# Patient Record
Sex: Male | Born: 1950 | Race: White | Hispanic: No | Marital: Single | Smoking: Former smoker
Health system: Southern US, Community
[De-identification: ages and names within clinical notes are randomized; demographics above are authoritative.]

## PROBLEM LIST (undated history)

## (undated) DIAGNOSIS — Z9981 Dependence on supplemental oxygen: Secondary | ICD-10-CM

## (undated) DIAGNOSIS — K279 Peptic ulcer, site unspecified, unspecified as acute or chronic, without hemorrhage or perforation: Secondary | ICD-10-CM

## (undated) DIAGNOSIS — J1282 Pneumonia due to coronavirus disease 2019: Secondary | ICD-10-CM

## (undated) DIAGNOSIS — E785 Hyperlipidemia, unspecified: Secondary | ICD-10-CM

## (undated) DIAGNOSIS — I700 Atherosclerosis of aorta: Secondary | ICD-10-CM

## (undated) DIAGNOSIS — I639 Cerebral infarction, unspecified: Secondary | ICD-10-CM

## (undated) DIAGNOSIS — U071 COVID-19: Secondary | ICD-10-CM

## (undated) DIAGNOSIS — K219 Gastro-esophageal reflux disease without esophagitis: Secondary | ICD-10-CM

## (undated) DIAGNOSIS — J449 Chronic obstructive pulmonary disease, unspecified: Secondary | ICD-10-CM

## (undated) DIAGNOSIS — J9610 Chronic respiratory failure, unspecified whether with hypoxia or hypercapnia: Secondary | ICD-10-CM

## (undated) DIAGNOSIS — I480 Paroxysmal atrial fibrillation: Secondary | ICD-10-CM

## (undated) DIAGNOSIS — I219 Acute myocardial infarction, unspecified: Secondary | ICD-10-CM

## (undated) DIAGNOSIS — F1010 Alcohol abuse, uncomplicated: Secondary | ICD-10-CM

## (undated) DIAGNOSIS — I3139 Other pericardial effusion (noninflammatory): Secondary | ICD-10-CM

## (undated) DIAGNOSIS — M67919 Unspecified disorder of synovium and tendon, unspecified shoulder: Secondary | ICD-10-CM

## (undated) DIAGNOSIS — M1990 Unspecified osteoarthritis, unspecified site: Secondary | ICD-10-CM

## (undated) DIAGNOSIS — I2510 Atherosclerotic heart disease of native coronary artery without angina pectoris: Secondary | ICD-10-CM

## (undated) DIAGNOSIS — I313 Pericardial effusion (noninflammatory): Secondary | ICD-10-CM

## (undated) HISTORY — PX: COLONOSCOPY: SHX174

## (undated) HISTORY — DX: Unspecified osteoarthritis, unspecified site: M19.90

## (undated) HISTORY — PX: EYE SURGERY: SHX253

## (undated) HISTORY — DX: Peptic ulcer, site unspecified, unspecified as acute or chronic, without hemorrhage or perforation: K27.9

## (undated) HISTORY — DX: Unspecified disorder of synovium and tendon, unspecified shoulder: M67.919

## (undated) HISTORY — DX: Hyperlipidemia, unspecified: E78.5

## (undated) HISTORY — DX: Paroxysmal atrial fibrillation: I48.0

## (undated) HISTORY — DX: Chronic obstructive pulmonary disease, unspecified: J44.9

## (undated) HISTORY — DX: Acute myocardial infarction, unspecified: I21.9

## (undated) HISTORY — PX: OTHER SURGICAL HISTORY: SHX169

## (undated) HISTORY — DX: Gastro-esophageal reflux disease without esophagitis: K21.9

---

## 2001-07-11 ENCOUNTER — Inpatient Hospital Stay (HOSPITAL_COMMUNITY): Admission: EM | Admit: 2001-07-11 | Discharge: 2001-07-13 | Payer: Self-pay | Admitting: *Deleted

## 2001-07-11 ENCOUNTER — Encounter: Payer: Self-pay | Admitting: Cardiology

## 2001-07-12 ENCOUNTER — Encounter: Payer: Self-pay | Admitting: Cardiology

## 2001-09-07 ENCOUNTER — Ambulatory Visit (HOSPITAL_COMMUNITY): Admission: RE | Admit: 2001-09-07 | Discharge: 2001-09-08 | Payer: Self-pay | Admitting: Cardiology

## 2004-08-17 DIAGNOSIS — I219 Acute myocardial infarction, unspecified: Secondary | ICD-10-CM

## 2004-08-17 HISTORY — DX: Acute myocardial infarction, unspecified: I21.9

## 2005-10-30 ENCOUNTER — Ambulatory Visit (HOSPITAL_COMMUNITY): Admission: RE | Admit: 2005-10-30 | Discharge: 2005-10-30 | Payer: Self-pay | Admitting: Orthopedic Surgery

## 2006-09-17 ENCOUNTER — Ambulatory Visit (HOSPITAL_BASED_OUTPATIENT_CLINIC_OR_DEPARTMENT_OTHER): Admission: RE | Admit: 2006-09-17 | Discharge: 2006-09-17 | Payer: Self-pay | Admitting: Urology

## 2007-11-09 ENCOUNTER — Emergency Department (HOSPITAL_COMMUNITY): Admission: EM | Admit: 2007-11-09 | Discharge: 2007-11-09 | Payer: Self-pay | Admitting: Emergency Medicine

## 2008-03-08 ENCOUNTER — Ambulatory Visit: Payer: Self-pay | Admitting: Internal Medicine

## 2008-03-08 LAB — CONVERTED CEMR LAB
BUN: 10 mg/dL (ref 6–23)
CO2: 22 meq/L (ref 19–32)
Calcium: 9.4 mg/dL (ref 8.4–10.5)
Chloride: 104 meq/L (ref 96–112)
Cholesterol: 197 mg/dL (ref 0–200)
Creatinine, Ser: 0.9 mg/dL (ref 0.40–1.50)
Glucose, Bld: 93 mg/dL (ref 70–99)
HDL: 87 mg/dL (ref >39)
LDL Cholesterol: 99 mg/dL (ref 0–99)
Potassium: 4.4 meq/L (ref 3.5–5.3)
Sodium: 140 meq/L (ref 135–145)
Total CHOL/HDL Ratio: 2.3
Triglycerides: 56 mg/dL (ref <150)
VLDL: 11 mg/dL (ref 0–40)

## 2008-03-12 ENCOUNTER — Ambulatory Visit: Payer: Self-pay | Admitting: *Deleted

## 2008-10-02 ENCOUNTER — Encounter (INDEPENDENT_AMBULATORY_CARE_PROVIDER_SITE_OTHER): Payer: Self-pay | Admitting: *Deleted

## 2009-07-25 ENCOUNTER — Ambulatory Visit: Payer: Self-pay | Admitting: Family Medicine

## 2009-07-25 DIAGNOSIS — M67919 Unspecified disorder of synovium and tendon, unspecified shoulder: Secondary | ICD-10-CM | POA: Insufficient documentation

## 2009-07-25 DIAGNOSIS — M1990 Unspecified osteoarthritis, unspecified site: Secondary | ICD-10-CM | POA: Insufficient documentation

## 2009-07-25 DIAGNOSIS — M719 Bursopathy, unspecified: Secondary | ICD-10-CM

## 2009-07-25 DIAGNOSIS — F17200 Nicotine dependence, unspecified, uncomplicated: Secondary | ICD-10-CM | POA: Insufficient documentation

## 2010-01-08 ENCOUNTER — Telehealth: Payer: Self-pay | Admitting: Internal Medicine

## 2010-04-23 ENCOUNTER — Emergency Department (HOSPITAL_COMMUNITY): Admission: EM | Admit: 2010-04-23 | Discharge: 2010-04-23 | Payer: Self-pay | Admitting: Emergency Medicine

## 2010-05-13 ENCOUNTER — Ambulatory Visit: Payer: Self-pay | Admitting: Vascular Surgery

## 2010-05-13 ENCOUNTER — Ambulatory Visit: Admission: RE | Admit: 2010-05-13 | Discharge: 2010-05-13 | Payer: Self-pay | Admitting: Cardiology

## 2010-05-13 ENCOUNTER — Encounter: Payer: Self-pay | Admitting: Cardiology

## 2010-09-16 NOTE — Progress Notes (Signed)
Summary: Schedule Colonoscopy  Phone Note Outgoing Call Call back at Home Phone [phone]   Call placed by: Harlow Mares CMA Duncan Dull),  Jan 08, 2010 11:09 AM Call placed to: Patient Summary of Call: spoke to patient and he is out of work and does nto have any insurance i gave him the number to patient assistance and advised him of the program he will contact them and if he is able to get assistance he will call back to schedule his colonoscopy.  Initial call taken by: Harlow Mares CMA (AAMA),  Jan 08, 2010 11:10 AM

## 2010-09-16 NOTE — Miscellaneous (Signed)
Summary: ROI  ROI   Imported By: De Nurse 08/02/2009 14:07:48  _____________________________________________________________________  External Attachment:    Type:   Image     Comment:   External Document

## 2010-09-16 NOTE — Letter (Signed)
Summary: Recall Colonoscopy Letter  [facility]  [address] [place], [place] [address]   Phone: [phone]  Fax: [phone]      October 02, 2008 Zachary Chambers   MELCHOR KIRCHGESSNER [address] [place], [place]  [address]   Dear Mr. Oneil,   According to your medical record, it is time for you to schedule a Colonoscopy. The American Cancer Society recommends this procedure as a method to detect early colon cancer. Patients with a family history of colon cancer, or a personal history of colon polyps or inflammatory bowel disease are at increased risk.  This letter has beeen generated based on the recommendations made at the time of your procedure. If you feel that in your particular situation this may no longer apply, please contact our office.  Please call our office at [phone] to schedule this appointment or to update your records at your earliest convenience.  Thank you for cooperating with [place] to provide you with the very best care possible.   Sincerely,  Wilhemina Bonito. Marina Goodell M.D.  [facility] [phone]

## 2010-09-18 ENCOUNTER — Encounter: Payer: Self-pay | Admitting: *Deleted

## 2010-10-30 LAB — DIFFERENTIAL
Basophils Absolute: 0 10*3/uL (ref 0.0–0.1)
Basophils Relative: 1 % (ref 0–1)
Eosinophils Absolute: 0 10*3/uL (ref 0.0–0.7)
Eosinophils Relative: 1 % (ref 0–5)
Lymphocytes Relative: 17 % (ref 12–46)
Lymphs Abs: 1 10*3/uL (ref 0.7–4.0)
Monocytes Absolute: 1.1 10*3/uL — ABNORMAL HIGH (ref 0.1–1.0)
Monocytes Relative: 19 % — ABNORMAL HIGH (ref 3–12)
Neutro Abs: 3.9 10*3/uL (ref 1.7–7.7)
Neutrophils Relative %: 64 % (ref 43–77)

## 2010-10-30 LAB — CBC
HCT: 29.8 % — ABNORMAL LOW (ref 39.0–52.0)
Hemoglobin: 10.6 g/dL — ABNORMAL LOW (ref 13.0–17.0)
MCH: 35.1 pg — ABNORMAL HIGH (ref 26.0–34.0)
MCHC: 35.4 g/dL (ref 30.0–36.0)
MCV: 99.1 fL (ref 78.0–100.0)
Platelets: 710 10*3/uL — ABNORMAL HIGH (ref 150–400)
RBC: 3.01 MIL/uL — ABNORMAL LOW (ref 4.22–5.81)
RDW: 12.9 % (ref 11.5–15.5)
WBC: 6.2 10*3/uL (ref 4.0–10.5)

## 2010-10-30 LAB — COMPREHENSIVE METABOLIC PANEL
ALT: 10 U/L (ref 0–53)
AST: 17 U/L (ref 0–37)
Albumin: 2.8 g/dL — ABNORMAL LOW (ref 3.5–5.2)
Alkaline Phosphatase: 54 U/L (ref 39–117)
BUN: 6 mg/dL (ref 6–23)
CO2: 24 mEq/L (ref 19–32)
Calcium: 8.8 mg/dL (ref 8.4–10.5)
Chloride: 99 mEq/L (ref 96–112)
Creatinine, Ser: 0.79 mg/dL (ref 0.4–1.5)
GFR calc Af Amer: >60 mL/min (ref >60)
GFR calc non Af Amer: >60 mL/min (ref >60)
Glucose, Bld: 121 mg/dL — ABNORMAL HIGH (ref 70–99)
Potassium: 3.7 mEq/L (ref 3.5–5.1)
Sodium: 130 mEq/L — ABNORMAL LOW (ref 135–145)
Total Bilirubin: 0.4 mg/dL (ref 0.3–1.2)
Total Protein: 6.4 g/dL (ref 6.0–8.3)

## 2010-10-30 LAB — POCT CARDIAC MARKERS
CKMB, poc: <1 ng/mL — ABNORMAL LOW (ref 1.0–8.0)
Myoglobin, poc: 70.3 ng/mL (ref 12–200)
Troponin i, poc: <0.05 ng/mL (ref 0.00–0.09)

## 2010-12-30 ENCOUNTER — Encounter: Payer: Self-pay | Admitting: Cardiovascular Disease

## 2011-01-02 NOTE — Op Note (Signed)
NAMEJOHNN, KRASOWSKI                   ACCOUNT NO.:  [id number]   MEDICAL RECORD NO.:  [id number]          PATIENT TYPE:  AMB   LOCATION:  [facility]                         FACILITY:  [facility]   PHYSICIAN:  Bertram Millard. Dahlstedt, M.D.DATE OF BIRTH:  October 09, 1950   DATE OF PROCEDURE:  09/17/2006  DATE OF DISCHARGE:                               OPERATIVE REPORT   PREOPERATIVE DIAGNOSIS:  Large symptomatic right hydrocele.   POSTOPERATIVE DIAGNOSIS:  Large symptomatic right hydrocele.   PROCEDURE:  Right hydrocelectomy.   SURGEON:  Bertram Millard. Dahlstedt, M.D.   ANESTHESIA:  General with LMA.   COMPLICATIONS:  None.   BRIEF HISTORY:  This 60 year old male presented to my office over 6  months ago with a symptomatic right hydrocele.  Evaluation included just  a scrotal ultrasound as this testicle was not palpable on the right.  This revealed a large hydrocele with normal appearing testicles.   I recommended, then, that the patient be treated conservatively--no  surgery unless it bothered him significantly.  He recently called, and  said that he would like to proceed with surgery as it is quite  uncomfortable for him at the present time.  Risks and complications have  been discussed with the patient.  He understands these and desires to  proceed.   DESCRIPTION OF PROCEDURE:  The patient was administered preoperative IV  antibiotics.  The surgical side was marked; and he was taken to the  operating room where general anesthetic was administered using the LMA.  Genitalia and perineum were prepped and draped.  A 6-cm incision was  made on his anterior-inferior scrotum along the median raphe.  This was  carried down to tunica vaginalis with electrocautery.  Once we were  right on the tunica, circumferential dissection was achieved using blunt  dissection, freeing up the whole tunica vaginalis up to the cord.  The  hydrocele was then popped through the skin..  The hydrocele sac was  opened  anteriorly along its length.  Care was taken to cauterize the  edges quite well.  The fluid was drained.  The hydrocele was everted,  and imbricated posteriorly with a running 3-0 chromic.   The cord was then infiltrated with 7 mL of 1/4% plain Marcaine.  The  scrotal skin was everted, thus looking for small bleeders; none could be  seen.  A small drain hole was made in the inferior scrotum; and a 1/4-  inch Penrose drain placed through this and sutured to skin with 3-0  chromic.  The inner scrotum and the cord as well as the testicle were  inspected; and no bleeding was seen.  The scrotum was closed in two  layers.  The dartos muscle reapproximated with a running 3-0 chromic;  and the skin reapproximated with a running 4-0 chromic.  Dry sterile  dressings were placed.  Fluffs and then a jock strap was placed.   The patient tolerated procedure well.  He was awakened and taken to PACU  in stable condition.  He was told to discontinue his drain in 2 days.  Instructions were given  for that.  He was discharged on hydrocodone/apap  5/325 one p.o. q.4 h. p.r.n. pain #25 with one refill.  He will follow-  up on the 15th of this month.  Activity restrictions were administered  as well.      Bertram Millard. Dahlstedt, M.D.  Electronically Signed     SMD/MEDQ  D:  09/17/2006  T:  09/17/2006  Job:  [id number]   cc:   Gwen Pounds, MD  Fax: [phone]   Colleen Can. Deborah Chalk, M.D.  Fax: [phone]

## 2011-01-02 NOTE — Cardiovascular Report (Signed)
[place]. [facility]  Patient:    Zachary Chambers, Zachary Chambers Visit Number: [id number] MRN: [id number]          Service Type: MED Location: [facility] 2931 01 Attending Physician:  Eleanora Neighbor Dictated by:   Colleen Can. Deborah Chalk, M.D. Proc. Date: 07/11/01 Admit Date:  07/11/2001                          Cardiac Catheterization  HISTORY:  Mr. Swigart is a 60 year old gentleman who presented with acute chest pain that was off and on but associated with dramatic ST segment elevation in the inferolateral leads with chest pain and resolved when pain improved in the emergency room.  PROCEDURE:  Left heart catheterization with selective coronary angiography, left ventricular angiography, and stent placement in the left circumflex.  CARDIOLOGIST:  Colleen Can. Deborah Chalk, M.D.  TYPE AND SITE OF ENTRY:  Percutaneous right femoral artery.  CATHETERS:  6-French 4 curved Judkins right and left coronary catheters, 6-French pigtail ventriculographic catheter, 7-French FL4 guide catheter, high-torque floppy guidewire, and two Bx Velocity stents, more distally a 3.0 x 18 mm stent, and more proximally a 3.0 x 13 mm stent.  MEDICATIONS GIVEN PRIOR TO PROCEDURE:  Heparin and IV nitroglycerin .  MEDICATIONS GIVEN DURING PROCEDURE:  Integrilin and Versed 2 mg IV.  COMMENTS:  The patient tolerated the procedure well.  HEMODYNAMIC DATA:  The aortic pressure was 106/61.  LV was 110/7-14.  There was no aortic valve gradient noted on pullback.  ANGIOGRAPHIC DATA:  CORONARY ARTERIES: 1. Left main coronary artery is normal. 2. Left circumflex:  The left circumflex has sequential 95 and 80 to 90%    stenoses.  It is a large posterolateral branch and almost certainly the    culprit vessel.  The second lesion is a long segmental lesion. 3. Left anterior descending: The left anterior descending has calcification    proximally.  There is an 80 to 90% stenosis in the mid portion of the    left  anterior descending just beyond the large diagonal vessels. 4. Right coronary artery: The right coronary artery is a moderately large    dominant vessel.  There is 20 to 30% narrowing proximally, and as    procedure pictures progressed, there was catheter damping felt to be    related to spasm.  There was a 40 to 50% narrowing before the crux.  The    distal vessel is relatively small but satisfactory.  LEFT VENTRICULAR ANGIOGRAM:  Left ventricular angiogram was performed in the RAO projection.  Overall cardiac size and silhouette are normal.  There is very minimal inferior hypokinesis but normal global ejection fraction of 60%.  ANGIOPLASTY PROCEDURE:  We elected to perform angioplasty on the left circumflex.  Using a JL4, the high-torque floppy guidewire was positioned distally across both lesions.  We dilated the proximal lesion with a 3.0 x 20 mm CrossSail balloon and subsequently dilated the second sequential lesion with a 3.0 x 20 CrossSail.  We then returned with a 3.0 x 18 mm Bx Velocity distally and a 3.0 x 13 mm Bx Velocity proximally.  Both were inflated to 14 atmospheres.  The final angiographic result was felt to be quite satisfactory, and we were able to preserve the branch in the A-V groove, although there did appear to be at least 50 to 60% narrowing that improved after nitroglycerin.  OVERALL IMPRESSION: 1. Successful angioplasty of sequential lesions in the left  circumflex    coronary artery in the setting of acute evolving inferolateral myocardial    infarction. 2. Well preserved left ventricular function. 3. Residual 80% stenosis in the left anterior descending artery with    moderate coronary atherosclerosis in the right coronary artery. Dictated by:   Colleen Can Deborah Chalk, M.D. Attending Physician:  Eleanora Neighbor DD:  07/11/01 TD:  07/11/01 Job: [id number]

## 2011-01-02 NOTE — H&P (Signed)
[place]. [facility]  Patient:    Zachary Chambers, [place] T. Visit Number: [id number] MRN: [id number]          Service Type: Attending:  Colleen Can. Deborah Chalk, M.D. Dictated by:   Colleen Can Deborah Chalk, M.D. Adm. Date:  07/11/01                           History and Physical  CHIEF COMPLAINT: Acute inferolateral myocardial infarction.  HISTORY OF PRESENT ILLNESS: Mr. Wymer had onset of severe substernal chest pain with associated nausea and diaphoresis at approximately 5 a.m.  It would tend to go, lasting no more than ten minutes at a time.  He presented to the emergency room and had acute inferolateral ST segment elevation that resolved after nitroglycerin.  He was referred for hospitalization and catheterization with possible angioplasty.  SOCIAL HISTORY: He is a two pack a day cigarette smoker and drinks six to 12 beers a day.  He is divorced.  He has a son, who is with him today.  He is a Games developer.  PAST MEDICAL HISTORY: He has no other significant cardiovascular risk factors. He has had a history of gastroesophageal reflux disease.  He has no other significant past medical history.  MEDICATIONS:  1. Potassium.  2. On medicine for gastroesophageal reflux disease.  FAMILY HISTORY: Noncontributory.  Father is 81.  Mother is 39.  There is no heart disease in the family.  REVIEW OF SYSTEMS: Otherwise negative.  PHYSICAL EXAMINATION:  VITAL SIGNS: Blood pressure 129/73, heart rate 54, respiratory rate 22.  HEENT: Negative.  He has a full beard.  NECK: No jugular venous distention.  CHEST: Lungs are reasonably clear.  HEART: Regular rate and rhythm.  ABDOMEN: Soft, nontender.  EXTREMITIES: Peripheral pulses excellent.  No peripheral edema.  LABORATORY DATA: EKG shows dramatic ST elevation inferolaterally but follow-up EKG is essentially normal.  Chest x-ray is normal.  OVERALL IMPRESSION:  1. Acute inferolateral myocardial infarction  with resolution of ST segment     elevation.  2. Cigarette abuse.  3. Alcohol abuse.  4. Gastroesophageal reflux disease.  PLAN: The patient will be admitted and will go to the catheterization laboratory for urgent catheterization.  Procedure risks and benefits have been explained.  Risks including heart attack, stroke, heart stoppage, and death have all been explained and the patient understands and is willing to proceed. Dictated by:   Colleen Can Deborah Chalk, M.D. Attending:  Colleen Can. Deborah Chalk, M.D. DD:  07/11/01 TD:  07/11/01 Job: [id number]

## 2011-01-02 NOTE — Discharge Summary (Signed)
[place]. [facility]  Patient:    Zachary Chambers, Zachary Chambers Visit Number: [id number] MRN: [id number]          Service Type: CAT Location: 6500 6524 02 Attending Physician:  Eleanora Neighbor Dictated by:   Jennet Maduro Earl Gala, R.N., A.N.P. Admit Date:  09/07/2001 Discharge Date: 09/08/2001                             Discharge Summary  DISCHARGE DIAGNOSES: 1. Previous inferolateral myocardial infarction with stent placement to the    left circumflex July 11, 2001, with residual left anterior descending    disease with subsequent repeat coronary angiography and stent placement fo    the left anterior descending. 2. Ongoing tobacco abuse. 3. Ongoing alcohol use. 4. Gastroesophageal reflux disease.  HISTORY OF PRESENT ILLNESS:  Mr. Chevalier is a 60 year old white male who has known coronary artery disease.  He had a previous inferolateral MI in November with stent placement to the left circumflex.  At that time he demonstrated residual LAD disease and was now brought back in for further revascularization. From a cardiac standpoint he has done well and has remained asymptomatic.  Please see the dictated history and physical for further patient presentation and profile.  LABORATORY DATA:  Admission CBC shows hemoglobin 17, hematocrit 48, white count 6.7, platelets 263.  PT and PTT were unremarkable.  Cholesterol panel 221, HDL 59, LDL 147 with triglycerides 74.  Chemistries were all within normal limits.  HOSPITAL COURSE:  The patient was admitted from shortstay to undergo elective coronary angiography.  The overall procedure was tolerated well without any known complications.  LV function is normal.  The LAD had a 75% narrowing after the first diagonal branch and a subsequent 3.0 x 18 mm Cordis BX Velocity expandable stent was placed and inflated to a maximum of 14 atmospheres with an excellent result obtained.  The stents in the left circumflex have mild  irregularities, but are overall satisfactory.  There was ostial spasm of the right coronary that was resolved with intracoronary nitroglycerin.  There is a distal 50% narrowing of the right coronary.  IV Integrilin, heparin, and nitroglycerin were given during the procedure and he was subsequently transferred to 6500 for further monitoring.  Plans will be now made for him to be discharged in the morning if deemed stable on a.m. rounds.  CONDITION ON DISCHARGE:  Improved.  DISCHARGE MEDICATIONS:  He will resume all of his previous home medicines.  He will adding Plavix 75 mg a day for the next 21 days.  Will also add Lipitor 10 mg a day for cholesterol management.  ACTIVITY:  Light over the next two to three days.  WOUND CARE:  He is to place an ice pack as needed to the groin.  He is to call our office for any problems.  Otherwise will plan on seeing him in the office in approximately 10 days and he is asked to call to schedule that appointment. ictated by:   Jennet Maduro Earl Gala, R.N., A.N.P. Attending Physician:  Eleanora Neighbor DD:  09/07/01 TD:  09/08/01 Job: [id number]

## 2011-01-02 NOTE — Discharge Summary (Signed)
[place]. [facility]  Patient:    Zachary Chambers, Zachary Chambers Visit Number: [id number] MRN: [id number]          Service Type: MED Location: 2000 2024 01 Attending Physician:  Eleanora Neighbor Dictated by:   Jennet Maduro Earl Gala, R.N., A.N.P. Admit Date:  07/11/2001 Discharge Date: 07/13/2001                             Discharge Summary  DISCHARGE DIAGNOSES: 1. Acute inferolateral myocardial infarction with emergent coronary    angiography and subsequent stent placement to the left circumflex with    residual disease in the left anterior descending. 2. Ongoing tobacco abuse. 3. Ongoing alcohol abuse. 4. Hypokalemia. 5. Gastroesophageal reflux disease.  HISTORY OF PRESENT ILLNESS:  Zachary Chambers had the onset of severe substernal chest pain that was associated with nausea and diaphoresis at approximately 5 a.m. on the day of admission.  It tended to come and go.  He presented to the emergency room because he had never had symptoms like this before.  There he was noted to have ST segment elevation in the inferolateral leads.  He was referred for admission and subsequent emergent coronary angiography.  Please see the dictated History and Physical per Dr. Roger Chambers for further patient presentation and profile.  LABORATORY DATA AND X-RAY FINDINGS:  Hematocrit stable at 45, white count 11,000.  Potassium low at 2.9.  His troponin peaked at 0.92.  Cardiac enzymes peaked at 12.4.  HOSPITAL COURSE:  THe patient was admitted and he underwent acute coronary angiography per Dr. Roger Chambers.  Those results are as noted above.  He had successful angioplasty of sequential lesions in the left circumflex with stent placement x 2.  The final angiographic result was felt to be quite satisfactory and the branch in the AV groove was able to be preserved.  There is a residual 80% lesion in the left anterior descending with moderate disease in the right coronary.  It is felt  that he will need further revascularization in the future.  Postprocedure, he was transferred to the coronary care unit.  He progressed quite well.  He was given low-dose Valium around the clock due to his extensive alcohol history.  He had no evidence of DTs.  ACE inhibitor was able to be initiated.  His potassium was replaced accordingly and today on July 13, 2001, he is doing well.  He has had a followup PA and lateral chest x-ray which is unremarkable.  Chemistries at discharge showed a sodium of 138, potassium 3.9, chloride 104, CO2 29, BUN 4, creatinine 0.9 and glucose of 112. His overall physical exam is unremarkable and he is felt to be a stable candidate for discharge today.  CONDITION ON DISCHARGE:  Improved.  DISCHARGE MEDICATIONS: 1. Resume Nexium and potassium that he was taking at home. 2. Plavix 75 mg a day for the next 21 days to be taken with aspirin. 3. Aspirin daily. 4. Imdur 30 mg a day. 5. Altace 2.5 mg daily. 6. Nitroglycerin p.r.n.  ACTIVITY:  Light activity with no driving, no sexual intercourse.  He may walk 5-10 minutes two times a day.  DIET:  Low fat.  WOUND CARE:  He is to place an ice pack to the groin as needed.  SPECIAL INSTRUCTIONS:  He is strongly encouraged not to smoke.  FOLLOWUP:  Follow up in the office with a nurse practitioner in approximately 10 days and  he is asked to call the office to schedule that appointment time. Dictated by:   Jennet Maduro Earl Gala, R.N., A.N.P. Attending Physician:  Eleanora Neighbor DD:  07/13/01 TD:  07/13/01 Job: [phone] [id number]

## 2011-01-02 NOTE — H&P (Signed)
[place]. [facility]  Patient:    Zachary Chambers, Zachary Chambers Visit Number: [id number] MRN: [id number]          Service Type: MED Location: 2000 2024 01 Attending Physician:  Eleanora Neighbor Dictated by:   Jennet Maduro Earl Gala, R.N., A.N.P. Admit Date:  07/11/2001 Discharge Date: 07/13/2001                           History and Physical  CHIEF COMPLAINT:  None.  HISTORY OF PRESENT ILLNESS:  Zachary Chambers is a 60 year old white male who has known coronary disease.  He has had a recent inferolateral myocardial infarction with stent placement to the left circumflex towards the latter part of November.  He has known residual LAD disease that needs revascularization. He is referred on now for elective coronary angiography, as well as possible angioplasty.  From a cardiac standpoint, he has had no recurrent chest pain.  He has not used any nitroglycerin.  Unfortunately, he continues to have ongoing tobacco abuse, as well as ongoing alcohol use.  PAST MEDICAL HISTORY: 1. Inferolateral myocardial infarction on July 11, 2001, with stent    placement to the left circumflex.  There was residual LAD disease of an    80-90% nature in the mid portion.  The right coronary is a moderately large    vessel with 20-30% narrowing proximally.  There was well-preserved LV    function. 2. Ongoing tobacco abuse. 3. Ongoing alcohol use. 4. History of hypokalemia of uncertain etiology. 5. Gastroesophageal reflux disease.  ALLERGIES:  None known.  CURRENT MEDICATIONS: 1. Imdur 30 mg a day. 2. Aspirin daily. 3. Nexium 40 mg a day. 4. Celebrex 200 mg a day. 5. Altace 2.5 mg a day.  FAMILY HISTORY:  Negative for heart disease.  His father is 38 and his mother is 68.  SOCIAL HISTORY:  He lives alone.  He is divorced.  He is employed as a Games developer.  REVIEW OF SYSTEMS:  Basically as stated above and otherwise unremarkable.  PHYSICAL EXAMINATION:  He is a white male  who appears older than his stated age.  WEIGHT:  198 pounds.  VITAL SIGNS:  The blood pressure is  120/60 sitting and 120/70 standing and the heart rate is 92.  SKIN:  Warm and dry.  The color is unremarkable.  NECK:  Supple.  No JVD.  No lymphadenopathy.  No thyromegaly.  LUNGS:  Coarse to examination.  There is no significant dyspnea noted.  CHEST:  Symmetrical.  CARDIAC:  Regular rhythm with no murmur.  ABDOMEN:  Obese and soft.  Positive bowel sounds.  Nontender.  Without masses.  EXTREMITIES:  Without edema.  NEUROLOGIC:  Intact.  There are no gross focal deficits.  LABORATORY DATA:  Currently pending.  OVERALL IMPRESSION: 1. Previous inferolateral myocardial infarction with known residual left    anterior descending disease. 2. Ongoing tobacco, as well as alcohol use. 3. History of hypokalemia. 4. Gastroesophageal reflux disease maintained on Nexium.  PLAN:  Will proceed on with repeat cardiac catheterization and probable revascularization to the left anterior descending on September 07, 2001.  The procedure has been reviewed in detail and he is willing to proceed. Dictated by:   Jennet Maduro Earl Gala, R.N., A.N.P. Attending Physician:  Eleanora Neighbor DD:  08/30/01 TD:  08/30/01 Job: [id number]

## 2011-01-02 NOTE — Cardiovascular Report (Signed)
[place]. [facility]  Patient:    Zachary Chambers, Zachary Chambers Visit Number: [id number] MRN: [id number]          Service Type: CAT Location: [id number] Attending Physician:  Eleanora Neighbor Dictated by:   Colleen Can. Deborah Chalk, M.D. Proc. Date: 09/07/01 Admit Date:  09/07/2001                          Cardiac Catheterization  HISTORY:  Mr. Zachary Chambers presented with an inferolateral myocardial infarction on November 25th and had stents placed in the left circumflex sequentially.  He had a severe stenosis in the mid left anterior descending, as well as moderate distal disease in the right coronary artery.  He is referred for cardiac catheterization and probable angioplasty.  PROCEDURES PERFORMED:  Left heart catheterization with selective coronary angiography and left ventricular angiography with angioplasty and stenting of the left anterior descending.  TYPE AND SITE OF ENTRY:  Percutaneous right femoral artery.  CATHETERS:  Six [place] #4 curved Judkins right and left coronary catheters, 6-French pigtail ventriculography catheter, 7-French JL-4 guide, a Forte guide wire, BX Velocity 3.0 x 18 mm stent for primary stenting.  MEDICATIONS GIVEN DURING PROCEDURE:  Versed 4 mg IV, Integrilin, heparin, and IV nitroglycerin.  CONTRAST MATERIAL:  Omnipaque.  COMMENTS:  The patient tolerated the procedure well.  HEMODYNAMIC DATA: 1. Aortic pressure was 128/80. 2. LV pressure was 110/13. 3. There was no aortic valve gradient noted on pullback.  ANGIOGRAPHIC DATA: 1. Left main coronary artery:  Normal. 2. Left anterior descending:  The left anterior descending has irregularities    proximally and gives off a moderate sized second diagonal vessel.  Just    beyond the origin of the diagonal vessel there is a segmental 75% stenosis. 3. Left circumflex:  The left circumflex has the presence of two stents that    are radiopaque and easily located.  There is mild narrowing in  between the    stents at the level of the origin of a continuation branch.  The stents    only had mild intimal hyperplasia and are widely patent with excellent    flow. 4. Right coronary artery:  The right coronary artery initially had ostial    spasm.  IV nitroglycerin was given with resolution of most of the spasm,    but with a residual 20-30% ostial narrowing and a 30-50% narrowing before    the crux.  The distal right coronary artery was widely patent.  LEFT VENTRICULAR ANGIOGRAM:  The left ventricular angiogram was performed in the RAO position.  Overall cardiac size and silhouette are normal.  Left ventricular function is normal, with a global ejection fraction of 65%.  There is no mitral regurgitation, intracardiac calcification, or intracavitary filling defect.  ANGIOPLASTY PROCEDURE:  We used a JL-4 guide and used a Forte guide wire with marker wires.  We elected to use an 18 mm x 3.0 BX Velocity stent.  This was placed primarily and inflated initially to 14 atm and then in followup to 12 atm.  The final angiographic result was excellent with no residual stenosis, no dissection, and preservation of all side branches.  OVERALL IMPRESSION: 1. Persistent patency of the stent to the left circumflex coronary artery. 2. Successful stent placement in the left anterior descending coronary. 3. Mild to moderate disease in the right coronary artery with ostial spasm    present. 4. Normal left ventricular function. Dictated by:  Colleen Can. Deborah Chalk, M.D. Attending Physician:  Eleanora Neighbor DD:  09/07/01 TD:  09/07/01 Job: [id number]

## 2011-01-06 ENCOUNTER — Ambulatory Visit: Payer: Self-pay | Admitting: Cardiovascular Disease

## 2011-01-14 ENCOUNTER — Encounter: Payer: Self-pay | Admitting: Cardiovascular Disease

## 2011-01-14 ENCOUNTER — Ambulatory Visit (INDEPENDENT_AMBULATORY_CARE_PROVIDER_SITE_OTHER)
Admission: RE | Admit: 2011-01-14 | Discharge: 2011-01-14 | Disposition: A | Discharge status: Home / Self Care | Payer: Medicare Other | Source: Ambulatory Visit | Attending: Cardiovascular Disease | Admitting: Cardiovascular Disease

## 2011-01-14 ENCOUNTER — Ambulatory Visit (INDEPENDENT_AMBULATORY_CARE_PROVIDER_SITE_OTHER): Payer: Medicare Other | Admitting: Cardiovascular Disease

## 2011-01-14 DIAGNOSIS — J449 Chronic obstructive pulmonary disease, unspecified: Secondary | ICD-10-CM

## 2011-01-14 DIAGNOSIS — J4489 Other specified chronic obstructive pulmonary disease: Secondary | ICD-10-CM

## 2011-01-14 DIAGNOSIS — IMO0001 Reserved for inherently not codable concepts without codable children: Secondary | ICD-10-CM

## 2011-01-14 DIAGNOSIS — F17200 Nicotine dependence, unspecified, uncomplicated: Secondary | ICD-10-CM

## 2011-01-14 DIAGNOSIS — I2510 Atherosclerotic heart disease of native coronary artery without angina pectoris: Secondary | ICD-10-CM

## 2011-01-14 DIAGNOSIS — K219 Gastro-esophageal reflux disease without esophagitis: Secondary | ICD-10-CM

## 2011-01-14 DIAGNOSIS — E785 Hyperlipidemia, unspecified: Secondary | ICD-10-CM

## 2011-01-14 MED ORDER — SIMVASTATIN 40 MG PO TABS: 40.0000 mg | ORAL_TABLET | Freq: Every evening | ORAL | Status: DC

## 2011-01-14 MED ORDER — NITROGLYCERIN 0.4 MG SL SUBL: 0.4000 mg | SUBLINGUAL_TABLET | SUBLINGUAL | Status: DC | PRN

## 2011-01-14 NOTE — Progress Notes (Signed)
60 yo previously seen by Dr Deborah Chalk.  Reviewed last office visit from 6/11.  He has had infrequent medical F/U due to lack of insurance.  Has no primary currently.  CAD with stent in circ and latest cath 2003 with patent circ stent and new stent placed to LAD.  Previous angina a cramping pain in chest.  Has not had any of this.  Smokes 1-2 ppd.  Has not had recent CXR.  Mild exertional dyspnea.  No cough, sputum or fever.  Has medicare now.  Not taking lipitor  Only taking asa and an over the counter acid pill.  Counselled for less than 10 minutes on smoking cessation.  Not motivated to quit.  Active doing yard work.  No palpitations, PND orthopnea.  EF normal in past.    ROS: Denies fever, malais, weight loss, blurry vision, decreased visual acuity, cough, sputum, SOB, hemoptysis, pleuritic pain, palpitaitons, heartburn, abdominal pain, melena, lower extremity edema, claudication, or rash.   General: Affect appropriate Healthy:  appears stated age HEENT: normal Neck supple with no adenopathy JVP normal no bruits no thyromegaly Lungs clear with no wheezing and good diaphragmatic motion Heart:  S1/S2 no murmur,rub, gallop or click PMI normal Abdomen: benighn, BS positve, no tenderness, no AAA no bruit.  No HSM or HJR Distal pulses intact with no bruits No edema Neuro non-focal Skin warm and dry No muscular weakness  Medications Current Outpatient Prescriptions  Medication Sig Dispense Refill  . aspirin 81 MG tablet Take 81 mg by mouth daily.        . Cimetidine (ACID REDUCER PO) as needed.       Marland Kitchen GLUCOSAMINE-CHONDROITIN PO 2 tabs po qd       . DISCONTD: Atorvastatin Calcium (LIPITOR PO) Take by mouth.        . DISCONTD: carvedilol (COREG) 12.5 MG tablet Take 12.5 mg by mouth 2 (two) times daily. For heart       . DISCONTD: simvastatin (ZOCOR) 20 MG tablet Take 20 mg by mouth at bedtime.          Allergies Review of patient's allergies indicates no known allergies.  Family  History: No family history on file.  Social History: History   Social History  . Marital Status: Single    Spouse Name: N/A    Number of Children: N/A  . Years of Education: N/A   Occupational History  . Not on file.   Social History Main Topics  . Smoking status: Current Everyday Smoker  . Smokeless tobacco: Not on file  . Alcohol Use: Not on file  . Drug Use: Not on file  . Sexually Active: Not on file   Other Topics Concern  . Not on file   Social History Narrative  . No narrative on file  Previous Engineer, civil (consulting).  Lives with girlfriend.  Divorced with one son he is not close to  Occasional ETOH.  1-2 ppd smoker. Sedentary  Electrocardiogram:  NSR 69 normal ECG  Assessment and Plan

## 2011-01-14 NOTE — Patient Instructions (Signed)
Your physician has recommended you make the following change in your medication: take simvastatin 40 mg 1 every day   Your physician recommends that you schedule a follow-up appointment in: 6 months with dr Eden Emms  A chest x-ray takes a picture of the organs and structures inside the chest, including the heart, lungs, and blood vessels. This test can show several things, including, whether the heart is enlarges; whether fluid is building up in the lungs; and whether pacemaker / defibrillator leads are still in place.  Dx smoking

## 2011-01-14 NOTE — Assessment & Plan Note (Signed)
Continue over the counter nexium.

## 2011-01-14 NOTE — Assessment & Plan Note (Signed)
Stable.  No angina  Continue ASA and risk factor modification.  Consider ETT in future

## 2011-01-14 NOTE — Assessment & Plan Note (Signed)
Zocor 40 mg to be called in F/U labs in 6 months

## 2011-01-14 NOTE — Assessment & Plan Note (Signed)
Counseled on cessation.  Not motivated to quit. CXR for clinical COPD

## 2011-03-13 ENCOUNTER — Telehealth: Payer: Self-pay | Admitting: Cardiovascular Disease

## 2011-03-13 NOTE — Telephone Encounter (Signed)
gentric for lipitor . [facility] [address] [phone]

## 2011-03-16 MED ORDER — ATORVASTATIN CALCIUM 10 MG PO TABS: 10.0000 mg | ORAL_TABLET | Freq: Every day | ORAL | Status: DC

## 2011-03-16 NOTE — Telephone Encounter (Signed)
Spoke with pt, refill for lipitor sent to [facility]

## 2011-03-16 NOTE — Telephone Encounter (Signed)
Should Pt be switched

## 2011-07-21 ENCOUNTER — Ambulatory Visit (INDEPENDENT_AMBULATORY_CARE_PROVIDER_SITE_OTHER): Payer: Medicare Other | Admitting: Cardiovascular Disease

## 2011-07-21 ENCOUNTER — Encounter: Payer: Self-pay | Admitting: Cardiovascular Disease

## 2011-07-21 DIAGNOSIS — J449 Chronic obstructive pulmonary disease, unspecified: Secondary | ICD-10-CM

## 2011-07-21 DIAGNOSIS — K219 Gastro-esophageal reflux disease without esophagitis: Secondary | ICD-10-CM

## 2011-07-21 DIAGNOSIS — F17200 Nicotine dependence, unspecified, uncomplicated: Secondary | ICD-10-CM

## 2011-07-21 DIAGNOSIS — IMO0001 Reserved for inherently not codable concepts without codable children: Secondary | ICD-10-CM

## 2011-07-21 MED ORDER — PANTOPRAZOLE SODIUM 40 MG PO TBEC: 40.0000 mg | DELAYED_RELEASE_TABLET | Freq: Every day | ORAL | Status: DC

## 2011-07-21 NOTE — Progress Notes (Signed)
60 yo previously seen by Dr Deborah Chalk. New to me 5/12  . He has had infrequent medical F/U due to lack of insurance. Has no primary currently. CAD with stent in circ and latest cath 2003 with patent circ stent and new stent placed to LAD. Previous angina a cramping pain in chest. Has not had any of this. Smokes 1-2 ppd.  CXR 01/14/11 with hyperinflation and no other abnormalities . Mild exertional dyspnea. No cough, sputum or fever. Has medicare now. Not taking lipitor Only taking asa and an over the counter acid pill. Counselled for less than 10 minutes on smoking cessation. Not motivated to quit. Active doing yard work. No palpitations, PND orthopnea. EF normal in past.   ROS: Denies fever, malais, weight loss, blurry vision, decreased visual acuity, cough, sputum, SOB, hemoptysis, pleuritic pain, palpitaitons, heartburn, abdominal pain, melena, lower extremity edema, claudication, or rash.  All other systems reviewed and negative  General: Affect appropriate Chronically ill HEENT:  Fake right eye Neck supple with no adenopathy JVP normal no bruits no thyromegaly Lungs clear with end expitory  wheezing and good diaphragmatic motion Heart:  S1/S2 no murmur,rub, gallop or click PMI normal Abdomen: benighn, BS positve, no tenderness, no AAA no bruit.  No HSM or HJR Distal pulses intact with no bruits No edema Neuro non-focal Skin warm and dry No muscular weakness   Current Outpatient Prescriptions  Medication Sig Dispense Refill  . aspirin 81 MG tablet Take 81 mg by mouth daily.        Marland Kitchen atorvastatin (LIPITOR) 10 MG tablet Take 1 tablet (10 mg total) by mouth daily.  30 tablet  11  . Cimetidine (ACID REDUCER PO) as needed.       Marland Kitchen GLUCOSAMINE-CHONDROITIN PO 2 tabs po qd       . nitroGLYCERIN (NITROSTAT) 0.4 MG SL tablet Place 1 tablet (0.4 mg total) under the tongue every 5 (five) minutes as needed for chest pain.  100 tablet  3    Allergies  Review of patient's allergies indicates no  known allergies.  Electrocardiogram:  01/14/11  NSR rate 69 normal ECG  Assessment and Plan

## 2011-07-21 NOTE — Assessment & Plan Note (Signed)
Cholesterol is at goal.  Continue current dose of statin and diet Rx.  No myalgias or side effects.  F/U  LFT's in 6 months. Lab Results  Component Value Date   LDLCALC 99 03/08/2008

## 2011-07-21 NOTE — Assessment & Plan Note (Signed)
F/U CXR 5/13  Needs primary to follow  Discussed smoking cessation

## 2011-07-21 NOTE — Assessment & Plan Note (Signed)
Stable with no angina and good activity level.  Continue medical Rx  

## 2011-07-21 NOTE — Assessment & Plan Note (Signed)
Counseled for less than 10 minutes Not motivated to quit 

## 2011-07-21 NOTE — Patient Instructions (Signed)
Your physician wants you to follow-up in:  6 months. You will receive a reminder letter in the mail two months in advance. If you don't receive a letter, please call our office to schedule the follow-up appointment.  Your physician has recommended you make the following change in your medication: Start Protonix 40 mg by mouth daily     

## 2011-07-21 NOTE — Assessment & Plan Note (Signed)
Will call in Protonix for him

## 2011-11-02 ENCOUNTER — Encounter: Payer: Self-pay | Admitting: Home Health Services

## 2011-11-09 ENCOUNTER — Telehealth: Payer: Self-pay | Admitting: Cardiovascular Disease

## 2011-11-09 NOTE — Telephone Encounter (Signed)
Zyban and patches

## 2011-11-09 NOTE — Telephone Encounter (Signed)
PT AWARE WILL FORWARD TO DR Eden Emms FOR REVIEW FOR BEST STOP SMOKING AID .Zachary Chambers

## 2011-11-09 NOTE — Telephone Encounter (Signed)
New problem:  Patient calling to see if he can get something called in for his smoking.

## 2011-11-11 MED ORDER — BUPROPION HCL ER (SR) 150 MG PO TB12: 150.0000 mg | ORAL_TABLET | Freq: Two times a day (BID) | ORAL | Status: DC

## 2011-11-11 NOTE — Telephone Encounter (Signed)
PT AWARE  SCRIPT FOR GEN ZYBAN SENT  TO PHARMACY VIA EPIC .Zack Seal

## 2011-11-12 ENCOUNTER — Telehealth: Payer: Self-pay | Admitting: Cardiovascular Disease

## 2011-11-12 NOTE — Telephone Encounter (Signed)
PT AWARE  MED IS ALSO  USED FOR  SMOKING CESSATION AS WELL AS  DEPRESSION. 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

## 2011-11-12 NOTE — Telephone Encounter (Signed)
Pt wants to know why the medication that was given to him to help stop smoking says it is for depression

## 2012-01-28 ENCOUNTER — Encounter: Payer: Self-pay | Admitting: Cardiovascular Disease

## 2012-01-28 ENCOUNTER — Ambulatory Visit (INDEPENDENT_AMBULATORY_CARE_PROVIDER_SITE_OTHER): Payer: Medicare Other | Admitting: Cardiovascular Disease

## 2012-01-28 ENCOUNTER — Ambulatory Visit (INDEPENDENT_AMBULATORY_CARE_PROVIDER_SITE_OTHER)
Admission: RE | Admit: 2012-01-28 | Discharge: 2012-01-28 | Disposition: A | Discharge status: Home / Self Care | Payer: Medicare Other | Source: Ambulatory Visit | Attending: Cardiovascular Disease | Admitting: Cardiovascular Disease

## 2012-01-28 VITALS — BP 146/74 | HR 76 | Wt 156.0 lb

## 2012-01-28 DIAGNOSIS — I2510 Atherosclerotic heart disease of native coronary artery without angina pectoris: Secondary | ICD-10-CM

## 2012-01-28 DIAGNOSIS — J8410 Pulmonary fibrosis, unspecified: Secondary | ICD-10-CM | POA: Diagnosis not present

## 2012-01-28 DIAGNOSIS — J449 Chronic obstructive pulmonary disease, unspecified: Secondary | ICD-10-CM | POA: Diagnosis not present

## 2012-01-28 DIAGNOSIS — E785 Hyperlipidemia, unspecified: Secondary | ICD-10-CM

## 2012-01-28 DIAGNOSIS — Z79899 Other long term (current) drug therapy: Secondary | ICD-10-CM | POA: Diagnosis not present

## 2012-01-28 DIAGNOSIS — F17200 Nicotine dependence, unspecified, uncomplicated: Secondary | ICD-10-CM | POA: Diagnosis not present

## 2012-01-28 DIAGNOSIS — J438 Other emphysema: Secondary | ICD-10-CM | POA: Diagnosis not present

## 2012-01-28 LAB — BASIC METABOLIC PANEL
BUN: 6 mg/dL (ref 6–23)
CO2: 28 mEq/L (ref 19–32)
Calcium: 9.5 mg/dL (ref 8.4–10.5)
Chloride: 99 mEq/L (ref 96–112)
Creatinine, Ser: 0.8 mg/dL (ref 0.4–1.5)
GFR: 108.57 mL/min (ref >60.00)
Glucose, Bld: 82 mg/dL (ref 70–99)
Potassium: 4.3 mEq/L (ref 3.5–5.1)
Sodium: 137 mEq/L (ref 135–145)

## 2012-01-28 LAB — CBC WITH DIFFERENTIAL/PLATELET
Basophils Absolute: 0 10*3/uL (ref 0.0–0.1)
Basophils Relative: 0.4 % (ref 0.0–3.0)
Eosinophils Absolute: 0.2 10*3/uL (ref 0.0–0.7)
Eosinophils Relative: 5.3 % — ABNORMAL HIGH (ref 0.0–5.0)
HCT: 38.4 % — ABNORMAL LOW (ref 39.0–52.0)
Hemoglobin: 13 g/dL (ref 13.0–17.0)
Lymphocytes Relative: 29.5 % (ref 12.0–46.0)
Lymphs Abs: 1.2 10*3/uL (ref 0.7–4.0)
MCHC: 33.8 g/dL (ref 30.0–36.0)
MCV: 101.7 fl — ABNORMAL HIGH (ref 78.0–100.0)
Monocytes Absolute: 0.4 10*3/uL (ref 0.1–1.0)
Monocytes Relative: 8.9 % (ref 3.0–12.0)
Neutro Abs: 2.3 10*3/uL (ref 1.4–7.7)
Neutrophils Relative %: 55.9 % (ref 43.0–77.0)
Platelets: 276 10*3/uL (ref 150.0–400.0)
RBC: 3.78 Mil/uL — ABNORMAL LOW (ref 4.22–5.81)
RDW: 13.8 % (ref 11.5–14.6)
WBC: 4 10*3/uL — ABNORMAL LOW (ref 4.5–10.5)

## 2012-01-28 LAB — HEPATIC FUNCTION PANEL
ALT: 23 U/L (ref 0–53)
AST: 30 U/L (ref 0–37)
Albumin: 4.1 g/dL (ref 3.5–5.2)
Alkaline Phosphatase: 49 U/L (ref 39–117)
Bilirubin, Direct: 0.1 mg/dL (ref 0.0–0.3)
Total Bilirubin: 0.9 mg/dL (ref 0.3–1.2)
Total Protein: 7 g/dL (ref 6.0–8.3)

## 2012-01-28 LAB — LIPID PANEL
Cholesterol: 125 mg/dL (ref 0–200)
HDL: 96 mg/dL (ref >39.00)
LDL Cholesterol: 24 mg/dL (ref 0–99)
Total CHOL/HDL Ratio: 1
Triglycerides: 27 mg/dL (ref 0.0–149.0)
VLDL: 5.4 mg/dL (ref 0.0–40.0)

## 2012-01-28 MED ORDER — NITROGLYCERIN 0.4 MG SL SUBL: 0.4000 mg | SUBLINGUAL_TABLET | SUBLINGUAL | Status: DC | PRN

## 2012-01-28 NOTE — Assessment & Plan Note (Addendum)
Needs labs today and adjust statin as nec Lab Results  Component Value Date   LDLCALC 99 03/08/2008

## 2012-01-28 NOTE — Progress Notes (Signed)
Patient ID: Zachary Chambers, male   DOB: Oct 10, 1950, 61 y.o.   MRN: [id number] 61 yo previously seen by Dr Deborah Chalk. New to me 5/12 . He has had infrequent medical F/U due to lack of insurance. Has no primary currently. CAD with stent in circ and latest cath 2003 with patent circ stent and new stent placed to LAD. Previous angina a cramping pain in chest. Has not had any of this. Smokes 1-2 ppd. CXR 01/14/11 with hyperinflation and no other abnormalities . Mild exertional dyspnea. No cough, sputum or fever. Has medicare now. Not taking lipitor Only taking asa and an over the counter acid pill. Counselled for less than 10 minutes on smoking cessation. Not motivated to quit. Active doing yard work. No palpitations, PND orthopnea. EF normal in past.  Has n ano primary care doctor  Lipids and liver not been checked in 2 years.  Not had CXR since 5/12 and that showed hyperinflation with no lesions  ROS: Denies fever, malais, weight loss, blurry vision, decreased visual acuity, cough, sputum, SOB, hemoptysis, pleuritic pain, palpitaitons, heartburn, abdominal pain, melena, lower extremity edema, claudication, or rash.  All other systems reviewed and negative  General: Affect appropriate Chronically ill COPD HEENT: normal Neck supple with no adenopathy JVP normal no bruits no thyromegaly Lungs clear with no wheezing and good diaphragmatic motion Heart:  S1/S2 no murmur, no rub, gallop or click PMI normal Abdomen: benighn, BS positve, no tenderness, no AAA no bruit.  No HSM or HJR Distal pulses intact with no bruits Plus one bilateral  edema Neuro non-focal Skin warm and dry No muscular weakness   Current Outpatient Prescriptions  Medication Sig Dispense Refill  . aspirin 81 MG tablet Take 81 mg by mouth daily.        Marland Kitchen atorvastatin (LIPITOR) 10 MG tablet Take 1 tablet (10 mg total) by mouth daily.  30 tablet  11  . buPROPion (WELLBUTRIN SR) 150 MG 12 hr tablet Take 1 tablet (150 mg total) by mouth 2  (two) times daily.  60 tablet  3  . Cimetidine (ACID REDUCER PO) as needed.       Marland Kitchen GLUCOSAMINE-CHONDROITIN PO 2 tabs po qd       . nitroGLYCERIN (NITROSTAT) 0.4 MG SL tablet Place 1 tablet (0.4 mg total) under the tongue every 5 (five) minutes as needed for chest pain.  100 tablet  3  . pantoprazole (PROTONIX) 40 MG tablet Take 1 tablet (40 mg total) by mouth daily.  30 tablet  11    Allergies  Review of patient's allergies indicates no known allergies.  Electrocardiogram:  01/14/11  nsr rate 61 normal ECG today no change rate 76 normal  Assessment and Plan

## 2012-01-28 NOTE — Assessment & Plan Note (Signed)
No motivation to qujit.  CXR for COPD and smoking.  Needs primary care doctor

## 2012-01-28 NOTE — Patient Instructions (Addendum)
Your physician wants you to follow-up in: YEAR WITH DR Haywood Filler will receive a reminder letter in the mail two months in advance. If you don't receive a letter, please call our office to schedule the follow-up appointment. Your physician recommends that you continue on your current medications as directed. Please refer to the Current Medication list given to you today. Your physician recommends that you return for lab work in: TODAY  BMET CBC LIPID LIVER  DX 401.1 272.4  V58.69 A chest x-ray takes a picture of the organs and structures inside the chest, including the heart, lungs, and blood vessels. This test can show several things, including, whether the heart is enlarges; whether fluid is building up in the lungs; and whether pacemaker / defibrillator leads are still in place.DX  COPD

## 2012-01-28 NOTE — Assessment & Plan Note (Addendum)
Stable with no angina and good activity level.  Continue medical Rx New nitro called in  

## 2012-02-08 ENCOUNTER — Encounter: Payer: Self-pay | Admitting: Cardiology

## 2012-02-27 ENCOUNTER — Other Ambulatory Visit: Payer: Self-pay | Admitting: Cardiovascular Disease

## 2012-02-29 ENCOUNTER — Telehealth: Payer: Self-pay | Admitting: Cardiovascular Disease

## 2012-02-29 NOTE — Telephone Encounter (Signed)
Left a message to call back.

## 2012-02-29 NOTE — Telephone Encounter (Signed)
Patient returning nurse call he can be reached at [phone].

## 2012-02-29 NOTE — Telephone Encounter (Signed)
Fax Received. Refill Completed. Zachary Chambers (R.M.A)   

## 2012-03-01 ENCOUNTER — Telehealth: Payer: Self-pay | Admitting: *Deleted

## 2012-03-01 NOTE — Telephone Encounter (Signed)
ERROR ENCOUNTER NOT NEEDED./CY

## 2012-03-01 NOTE — Telephone Encounter (Signed)
Fu call °Pt returning your call  °

## 2012-03-01 NOTE — Telephone Encounter (Signed)
PER PT WAS RETURNING CALL  THAT WAS MADE FROM OFFICE,AFTER REVIEWING PT'S CHART  DO NOT SEE ANY REASON  THAT  WE WOULD HAVE CALLED  AND NO DOCUMENTATION  NOTED.  PT AWARE  WILL CALL FIND IF ANYTHING DIFFER OTHERWISE  WILL SEE DR [facility] NEXT June 2014./CY

## 2012-08-11 ENCOUNTER — Other Ambulatory Visit: Payer: Self-pay

## 2012-08-12 ENCOUNTER — Other Ambulatory Visit: Payer: Self-pay | Admitting: *Deleted

## 2012-08-12 MED ORDER — PANTOPRAZOLE SODIUM 40 MG PO TBEC: 40.0000 mg | DELAYED_RELEASE_TABLET | Freq: Every day | ORAL | Status: DC

## 2012-10-12 ENCOUNTER — Telehealth: Payer: Self-pay | Admitting: Cardiovascular Disease

## 2012-10-12 MED ORDER — SIMVASTATIN 40 MG PO TABS: ORAL_TABLET | ORAL | Status: DC

## 2012-10-12 NOTE — Telephone Encounter (Signed)
SIMVASTATIN  CALLED IN TO [facility] [address]./CY

## 2012-10-12 NOTE — Telephone Encounter (Signed)
Pt needs simvastatin called into [facility] [address] pt is completely out of meds

## 2013-02-03 ENCOUNTER — Ambulatory Visit (INDEPENDENT_AMBULATORY_CARE_PROVIDER_SITE_OTHER): Payer: Medicare Other | Admitting: Cardiovascular Disease

## 2013-02-03 ENCOUNTER — Encounter: Payer: Self-pay | Admitting: Cardiovascular Disease

## 2013-02-03 DIAGNOSIS — I2510 Atherosclerotic heart disease of native coronary artery without angina pectoris: Secondary | ICD-10-CM

## 2013-02-03 DIAGNOSIS — E785 Hyperlipidemia, unspecified: Secondary | ICD-10-CM | POA: Diagnosis not present

## 2013-02-03 DIAGNOSIS — F17200 Nicotine dependence, unspecified, uncomplicated: Secondary | ICD-10-CM

## 2013-02-03 DIAGNOSIS — Z79899 Other long term (current) drug therapy: Secondary | ICD-10-CM | POA: Diagnosis not present

## 2013-02-03 NOTE — Assessment & Plan Note (Signed)
Counseled for less than 10 minutes Little motivation to quit Discussed E-cigs.  CXR to be done next week Has clinical COPD with wheezing

## 2013-02-03 NOTE — Addendum Note (Signed)
Addended by: Scherrie Bateman E on: 02/03/2013 02:25 PM   Modules accepted: Orders

## 2013-02-03 NOTE — Assessment & Plan Note (Signed)
Stable with no angina and good activity level.  Continue medical Rx  

## 2013-02-03 NOTE — Patient Instructions (Signed)
Your physician wants you to follow-up in: 6 MONTHS WITH DR Haywood Filler will receive a reminder letter in the mail two months in advance. If you don't receive a letter, please call our office to schedule the follow-up appointment. A chest x-ray takes a picture of the organs and structures inside the chest, including the heart, lungs, and blood vessels. This test can show several things, including, whether the heart is enlarges; whether fluid is building up in the lungs; and whether pacemaker / defibrillator leads are still in place. NEXT  WEEK  Your physician recommends that you return for lab work in:  FASTING LIPID LIVER    DR [facility]  [address]  [address],[place]  [address] [phone] DR Lynnea Ferrier [address] ,[place] [phone]

## 2013-02-03 NOTE — Assessment & Plan Note (Signed)
Have been good on statin Will order labs next week since he ate today

## 2013-02-03 NOTE — Progress Notes (Signed)
Patient ID: MARSELINO SLAYTON, male   DOB: 12-18-1950, 62 y.o.   MRN: [id number] 62 yo previously seen by Dr Deborah Chalk. New to me 5/12 . He has had infrequent medical F/U due to lack of insurance. Has no primary currently. CAD with stent in circ and latest cath 2003 with patent circ stent and new stent placed to LAD. Previous angina a cramping pain in chest. Has not had any of this. Smokes 1-2 ppd. CXR 01/14/11 with hyperinflation and no other abnormalities . Mild exertional dyspnea. No cough, sputum or fever. Has medicare now. Not taking lipitor Only taking asa and an over the counter acid pill. Counselled for less than 10 minutes on smoking cessation. Not motivated to quit. Active doing yard work. No palpitations, PND orthopnea. EF normal in past. Has n ano primary care doctor Lipids and liver not been checked in 2 years. Not had CXR since 5/12 and that showed hyperinflation with no lesions  Last labs a year ago showed LDL on ly 24    Still needs a primary Suggested [place] or [place] since he lives out that way  ROS: Denies fever, malais, weight loss, blurry vision, decreased visual acuity, cough, sputum, SOB, hemoptysis, pleuritic pain, palpitaitons, heartburn, abdominal pain, melena, lower extremity edema, claudication, or rash.  All other systems reviewed and negative  General: Affect appropriate Thin bronchitic male HEENT: normal Neck supple with no adenopathy JVP normal no bruits no thyromegaly Lungs clear with expitory wheezing and good diaphragmatic motion Heart:  S1/S2 no murmur, no rub, gallop or click PMI normal Abdomen: benighn, BS positve, no tenderness, no AAA no bruit.  No HSM or HJR Distal pulses intact with no bruits No edema Neuro non-focal Skin warm and dry No muscular weakness   Current Outpatient Prescriptions  Medication Sig Dispense Refill  . aspirin 81 MG tablet Take 81 mg by mouth daily.        . Cimetidine (ACID REDUCER PO) as needed.       . nitroGLYCERIN (NITROSTAT) 0.4  MG SL tablet Place 0.4 mg under the tongue every 5 (five) minutes as needed for chest pain.      . simvastatin (ZOCOR) 40 MG tablet TAKE ONE TABLET BY MOUTH EVERY DAY IN THE EVENING  30 tablet  11   No current facility-administered medications for this visit.    Allergies  Review of patient's allergies indicates no known allergies.  Electrocardiogram:  SR rate 77low voltage otherwise normal ECG  Assessment and Plan

## 2013-02-13 ENCOUNTER — Ambulatory Visit (INDEPENDENT_AMBULATORY_CARE_PROVIDER_SITE_OTHER)
Admission: RE | Admit: 2013-02-13 | Discharge: 2013-02-13 | Disposition: A | Discharge status: Home / Self Care | Payer: Medicare Other | Source: Ambulatory Visit | Attending: Cardiovascular Disease | Admitting: Cardiovascular Disease

## 2013-02-13 ENCOUNTER — Other Ambulatory Visit (INDEPENDENT_AMBULATORY_CARE_PROVIDER_SITE_OTHER): Payer: Medicare Other

## 2013-02-13 ENCOUNTER — Encounter: Payer: Self-pay | Admitting: Internal Medicine

## 2013-02-13 DIAGNOSIS — F17200 Nicotine dependence, unspecified, uncomplicated: Secondary | ICD-10-CM

## 2013-02-13 DIAGNOSIS — Z79899 Other long term (current) drug therapy: Secondary | ICD-10-CM | POA: Diagnosis not present

## 2013-02-13 DIAGNOSIS — E785 Hyperlipidemia, unspecified: Secondary | ICD-10-CM

## 2013-02-13 DIAGNOSIS — R918 Other nonspecific abnormal finding of lung field: Secondary | ICD-10-CM | POA: Diagnosis not present

## 2013-02-13 LAB — HEPATIC FUNCTION PANEL
ALT: 39 U/L (ref 0–53)
AST: 71 U/L — ABNORMAL HIGH (ref 0–37)
Albumin: 4.4 g/dL (ref 3.5–5.2)
Alkaline Phosphatase: 57 U/L (ref 39–117)
Bilirubin, Direct: 0.1 mg/dL (ref 0.0–0.3)
Total Bilirubin: 0.4 mg/dL (ref 0.3–1.2)
Total Protein: 7.7 g/dL (ref 6.0–8.3)

## 2013-02-13 LAB — LIPID PANEL
Cholesterol: 137 mg/dL (ref 0–200)
HDL: 101.9 mg/dL (ref >39.00)
LDL Cholesterol: 26 mg/dL (ref 0–99)
Total CHOL/HDL Ratio: 1
Triglycerides: 45 mg/dL (ref 0.0–149.0)
VLDL: 9 mg/dL (ref 0.0–40.0)

## 2013-02-21 ENCOUNTER — Ambulatory Visit (AMBULATORY_SURGERY_CENTER): Payer: Medicare Other | Admitting: *Deleted

## 2013-02-21 VITALS — Ht 68.0 in | Wt 153.4 lb

## 2013-02-21 DIAGNOSIS — Z86010 Personal history of colonic polyps: Secondary | ICD-10-CM

## 2013-02-21 MED ORDER — PEG-KCL-NACL-NASULF-NA ASC-C 100 G PO SOLR: ORAL | Status: DC

## 2013-02-21 NOTE — Progress Notes (Signed)
No egg or soy allergy 

## 2013-02-22 ENCOUNTER — Other Ambulatory Visit: Payer: Self-pay | Admitting: *Deleted

## 2013-02-22 DIAGNOSIS — R748 Abnormal levels of other serum enzymes: Secondary | ICD-10-CM

## 2013-03-07 ENCOUNTER — Ambulatory Visit (AMBULATORY_SURGERY_CENTER): Payer: Medicare Other | Admitting: Internal Medicine

## 2013-03-07 ENCOUNTER — Encounter: Payer: Self-pay | Admitting: Internal Medicine

## 2013-03-07 VITALS — BP 120/76 | HR 69 | Temp 97.9°F | Resp 17 | Ht 68.0 in | Wt 153.0 lb

## 2013-03-07 DIAGNOSIS — E785 Hyperlipidemia, unspecified: Secondary | ICD-10-CM | POA: Diagnosis not present

## 2013-03-07 DIAGNOSIS — I252 Old myocardial infarction: Secondary | ICD-10-CM | POA: Diagnosis not present

## 2013-03-07 DIAGNOSIS — Z86010 Personal history of colonic polyps: Secondary | ICD-10-CM | POA: Diagnosis not present

## 2013-03-07 DIAGNOSIS — D126 Benign neoplasm of colon, unspecified: Secondary | ICD-10-CM | POA: Diagnosis not present

## 2013-03-07 DIAGNOSIS — Z1211 Encounter for screening for malignant neoplasm of colon: Secondary | ICD-10-CM | POA: Diagnosis not present

## 2013-03-07 MED ORDER — SODIUM CHLORIDE 0.9 % IV SOLN: 500.0000 mL | INTRAVENOUS | Status: DC

## 2013-03-07 NOTE — Op Note (Signed)
[facility] [address] [place], [address]   COLONOSCOPY PROCEDURE REPORT  PATIENT: Zachary Chambers, Zachary Chambers  MR#: [id number] BIRTHDATE: [birth year] , 61  yrs. old GENDER: Male ENDOSCOPIST: Roxy Cedar, MD REFERRED XB:[id number] Program Recall PROCEDURE DATE:  03/07/2013 PROCEDURE:   Colonoscopy with snare polypectomy    x 4 ASA CLASS:   Class III INDICATIONS:Patient's personal history of adenomatous colon polyps. Index 09-2003 (small TA) MEDICATIONS: MAC sedation, administered by CRNA and propofol (Diprivan) 350mg  IV  DESCRIPTION OF PROCEDURE:   After the risks benefits and alternatives of the procedure were thoroughly explained, informed consent was obtained.  A digital rectal exam revealed external hemorrhoids.   The LB [id number]  endoscope was introduced through the anus and advanced to the cecum, which was identified by both the appendix and ileocecal valve. No adverse events experienced.   The quality of the prep was good, using MoviPrep The instrument was then slowly withdrawn as the colon was fully examined.   COLON FINDINGS: Four polyps were found  in the colon. 5mm and 6mm in the cecum, 5mm in the ascending colon, and 12mm pedunculated polyp in the rectum.  A polypectomy was performed with a cold snare one the subcentimeter polyps and hot snare on rectal polyp.  The resection was complete and the polyp tissue was completely retrieved.   Moderate diverticulosis was noted in the sigmoid colon.   The colon mucosa was otherwise normal.  Retroflexed views revealed internal hemorrhoids. The time to cecum=3 minutes 44 seconds.  Withdrawal time=14 minutes 20 seconds.  The scope was withdrawn and the procedure completed. COMPLICATIONS: There were no complications.  ENDOSCOPIC IMPRESSION: 1.   Four polyps were found  in the colon; polypectomy was performed  2.   Moderate diverticulosis was noted in the sigmoid colon 3.   The colon mucosa was otherwise  normal  RECOMMENDATIONS: 1. Repeat Colonoscopy in 3 years.   eSigned:  Roxy Cedar, MD 03/07/2013 2:47 PM   cc: The Patient

## 2013-03-07 NOTE — Progress Notes (Signed)
Called to room to assist during endoscopic procedure.  Patient ID and intended procedure confirmed with present staff. Received instructions for my participation in the procedure from the performing physician.  

## 2013-03-07 NOTE — Progress Notes (Signed)
Patient did not experience any of the following events: a burn prior to discharge; a fall within the facility; wrong site/side/patient/procedure/implant event; or a hospital transfer or hospital admission upon discharge from the facility. ([id number])Patient did not have preoperative order for IV antibiotic SSI prophylaxis. [phone])  Pt.s care partner stated that he would probably be drinking some beer tonight after procedure.  He was advised by nurse to avoid alcohol for remainder Of the day.  Again nurse reinforced that he would be better off not drinking alcohol today, but if he does it is his choice.

## 2013-03-07 NOTE — Patient Instructions (Addendum)

## 2013-03-08 ENCOUNTER — Telehealth: Payer: Self-pay

## 2013-03-08 NOTE — Telephone Encounter (Signed)
  Follow up Call-  Call back number 03/07/2013  Post procedure Call Back phone  # [phone]  Permission to leave phone message Yes     Patient questions:  Do you have a fever, pain , or abdominal swelling? no Pain Score  0 *  Have you tolerated food without any problems? yes  Have you been able to return to your normal activities? yes  Do you have any questions about your discharge instructions: Diet   no Medications  no Follow up visit  no  Do you have questions or concerns about your Care? no  Actions: * If pain score is 4 or above: No action needed, pain <4.  Per the pt, "when I went to bed my abdomen felt like I had a C-section".   I asked how he was this am and he said, "I feel fine this morning". Maw

## 2013-03-13 ENCOUNTER — Encounter: Payer: Self-pay | Admitting: Internal Medicine

## 2013-05-25 ENCOUNTER — Other Ambulatory Visit: Payer: Medicare Other

## 2013-05-30 ENCOUNTER — Ambulatory Visit: Payer: Self-pay | Admitting: Family Medicine

## 2013-06-19 ENCOUNTER — Ambulatory Visit (INDEPENDENT_AMBULATORY_CARE_PROVIDER_SITE_OTHER): Payer: Medicare Other | Admitting: Family Medicine

## 2013-06-19 ENCOUNTER — Encounter: Payer: Self-pay | Admitting: Family Medicine

## 2013-06-19 VITALS — BP 120/80 | HR 80 | Temp 98.3°F | Resp 18 | Ht 64.0 in | Wt 147.0 lb

## 2013-06-19 DIAGNOSIS — M1990 Unspecified osteoarthritis, unspecified site: Secondary | ICD-10-CM | POA: Diagnosis not present

## 2013-06-19 DIAGNOSIS — Z23 Encounter for immunization: Secondary | ICD-10-CM

## 2013-06-19 DIAGNOSIS — J449 Chronic obstructive pulmonary disease, unspecified: Secondary | ICD-10-CM | POA: Diagnosis not present

## 2013-06-19 DIAGNOSIS — F17200 Nicotine dependence, unspecified, uncomplicated: Secondary | ICD-10-CM

## 2013-06-19 DIAGNOSIS — IMO0001 Reserved for inherently not codable concepts without codable children: Secondary | ICD-10-CM

## 2013-06-19 DIAGNOSIS — I2510 Atherosclerotic heart disease of native coronary artery without angina pectoris: Secondary | ICD-10-CM

## 2013-06-19 DIAGNOSIS — K219 Gastro-esophageal reflux disease without esophagitis: Secondary | ICD-10-CM

## 2013-06-19 LAB — CBC WITH DIFFERENTIAL/PLATELET
Basophils Absolute: 0 10*3/uL (ref 0.0–0.1)
Basophils Relative: 1 % (ref 0–1)
Eosinophils Absolute: 0 10*3/uL (ref 0.0–0.7)
Eosinophils Relative: 0 % (ref 0–5)
HCT: 40.6 % (ref 39.0–52.0)
Hemoglobin: 14.2 g/dL (ref 13.0–17.0)
Lymphocytes Relative: 39 % (ref 12–46)
Lymphs Abs: 1.4 10*3/uL (ref 0.7–4.0)
MCH: 33.7 pg (ref 26.0–34.0)
MCHC: 35 g/dL (ref 30.0–36.0)
MCV: 96.4 fL (ref 78.0–100.0)
Monocytes Absolute: 0.4 10*3/uL (ref 0.1–1.0)
Monocytes Relative: 12 % (ref 3–12)
Neutro Abs: 1.7 10*3/uL (ref 1.7–7.7)
Neutrophils Relative %: 48 % (ref 43–77)
Platelets: 282 10*3/uL (ref 150–400)
RBC: 4.21 MIL/uL — ABNORMAL LOW (ref 4.22–5.81)
RDW: 13.1 % (ref 11.5–15.5)
WBC: 3.6 10*3/uL — ABNORMAL LOW (ref 4.0–10.5)

## 2013-06-19 MED ORDER — TRAMADOL HCL 50 MG PO TABS: 50.0000 mg | ORAL_TABLET | Freq: Three times a day (TID) | ORAL | Status: DC | PRN

## 2013-06-19 NOTE — Progress Notes (Signed)
  Subjective:    Patient ID: Zachary Chambers, male    DOB: 25-Jan-1951, 62 y.o.   MRN: [id number]  HPI Patient here to establish care. He has no current primary care provider. He is being followed by cardiology and gastroenterology. His medications and history were reviewed. He has a history of coronary artery disease has a history of MI he currently has 3 stents. He does not have part D. to his Medicare therefore has been unable to afford some cardiac medications in the past. He is due to have repeat fasting blood work.  His history of bleeding ulcer about 20 years ago he recently had a colonoscopy which he had some polyps removed but they were benign. He's been taking Prilosec over-the-counter as needed.  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 He has a history of rotator cuff syndrome which was evaluated by orthopedics a few years ago he is regaining some function however they did not pursue surgery at the time. He uses glucosamine which helps some he also takes some over-the-counter medications but very rarely. Worked in [employer] entire life  Emphysema/COPD there has been concern about COPD and emphysema he has had multiple chest x-rays which showed emphysematous changes. He is also a smoker. He's never had any pulmonary function tests but does admit to a cough and wheezing episodes at times.   Review of Systems GEN- denies fatigue, fever, weight loss,weakness, recent illness HEENT- denies eye drainage, change in vision, nasal discharge, CVS- denies chest pain, palpitations RESP- denies SOB, cough, wheeze ABD- denies N/V, change in stools, abd pain GU- denies dysuria, hematuria, dribbling, incontinence MSK- + joint pain, muscle aches, injury Neuro- denies headache, dizziness, syncope, seizure activity      Objective:   Physical Exam GEN- NAD, alert and oriented x3 HEENT- PERRL, EOMI, non injected sclera, pink  conjunctiva, MMM, oropharynx clear, poor dentition Neck- Supple,  CVS- RRR, no murmur RESP-CTAB ABD-NABS,soft, NT,ND MSK- Knees- Fair ROM, no effusion, +crepitus; Right Shoulder normal inspection, Decreased ROm compared to Left shoulder EXT- No edema Pulses- Radial, DP- 2+ Psych -normal affect and mood        Assessment & Plan:

## 2013-06-19 NOTE — Assessment & Plan Note (Signed)
Ultram Hold on ortho

## 2013-06-19 NOTE — Assessment & Plan Note (Signed)
Noted, smoker and emphysematous changes, he also has episodes of wheezing Obtain PFT

## 2013-06-19 NOTE — Assessment & Plan Note (Addendum)
Continue prilosec  He will look into Part D, currently pays out of pocket for meds Give nexium samples from office

## 2013-06-19 NOTE — Patient Instructions (Signed)
We will call with lab results Continue current medications Try the ultram for your arthritis and shoulder  Pulmonary Function test  F/U 3 months

## 2013-06-19 NOTE — Assessment & Plan Note (Signed)
Discussed importance of tobacco cessation. 

## 2013-06-19 NOTE — Assessment & Plan Note (Signed)
Reviewed cardiology note Check FLP

## 2013-06-20 LAB — LIPID PANEL
Cholesterol: 146 mg/dL (ref 0–200)
HDL: 92 mg/dL (ref >39)
LDL Cholesterol: 45 mg/dL (ref 0–99)
Total CHOL/HDL Ratio: 1.6 Ratio
Triglycerides: 47 mg/dL (ref <150)
VLDL: 9 mg/dL (ref 0–40)

## 2013-06-20 LAB — COMPREHENSIVE METABOLIC PANEL
ALT: 13 U/L (ref 0–53)
AST: 26 U/L (ref 0–37)
Albumin: 4.5 g/dL (ref 3.5–5.2)
Alkaline Phosphatase: 65 U/L (ref 39–117)
BUN: 4 mg/dL — ABNORMAL LOW (ref 6–23)
CO2: 28 mEq/L (ref 19–32)
Calcium: 9.9 mg/dL (ref 8.4–10.5)
Chloride: 97 mEq/L (ref 96–112)
Creat: 0.69 mg/dL (ref 0.50–1.35)
Glucose, Bld: 71 mg/dL (ref 70–99)
Potassium: 5.3 mEq/L (ref 3.5–5.3)
Sodium: 134 mEq/L — ABNORMAL LOW (ref 135–145)
Total Bilirubin: 0.5 mg/dL (ref 0.3–1.2)
Total Protein: 7.5 g/dL (ref 6.0–8.3)

## 2013-06-22 ENCOUNTER — Encounter: Payer: Self-pay | Admitting: Family Medicine

## 2013-07-11 ENCOUNTER — Encounter (HOSPITAL_COMMUNITY): Payer: Medicare Other

## 2013-07-18 ENCOUNTER — Ambulatory Visit (HOSPITAL_COMMUNITY)
Admission: RE | Admit: 2013-07-18 | Discharge: 2013-07-18 | Disposition: A | Discharge status: Home / Self Care | Payer: Medicare Other | Source: Ambulatory Visit | Attending: Family Medicine | Admitting: Family Medicine

## 2013-07-18 DIAGNOSIS — R059 Cough, unspecified: Secondary | ICD-10-CM | POA: Insufficient documentation

## 2013-07-18 DIAGNOSIS — R0609 Other forms of dyspnea: Secondary | ICD-10-CM | POA: Insufficient documentation

## 2013-07-18 DIAGNOSIS — J449 Chronic obstructive pulmonary disease, unspecified: Secondary | ICD-10-CM

## 2013-07-18 DIAGNOSIS — R05 Cough: Secondary | ICD-10-CM | POA: Insufficient documentation

## 2013-07-18 DIAGNOSIS — R0989 Other specified symptoms and signs involving the circulatory and respiratory systems: Secondary | ICD-10-CM | POA: Insufficient documentation

## 2013-07-18 LAB — PULMONARY FUNCTION TEST
DL/VA % pred: 58 %
DL/VA: 2.63 ml/min/mmHg/L
DLCO cor % pred: 47 %
DLCO cor: 14.22 ml/min/mmHg
DLCO unc % pred: 47 %
DLCO unc: 14.22 ml/min/mmHg
FEF 25-75 Post: 0.61 L/sec
FEF 25-75 Pre: 0.59 L/sec
FEF2575-%Change-Post: 3 %
FEF2575-%Pred-Post: 23 %
FEF2575-%Pred-Pre: 22 %
FEV1-%Change-Post: 2 %
FEV1-%Pred-Post: 44 %
FEV1-%Pred-Pre: 43 %
FEV1-Post: 1.42 L
FEV1-Pre: 1.39 L
FEV1FVC-%Change-Post: 1 %
FEV1FVC-%Pred-Pre: 61 %
FEV6-%Change-Post: -1 %
FEV6-%Pred-Post: 70 %
FEV6-%Pred-Pre: 71 %
FEV6-Post: 2.86 L
FEV6-Pre: 2.9 L
FEV6FVC-%Change-Post: -1 %
FEV6FVC-%Pred-Post: 98 %
FEV6FVC-%Pred-Pre: 100 %
FVC-%Change-Post: 0 %
FVC-%Pred-Post: 70 %
FVC-%Pred-Pre: 70 %
FVC-Post: 3.05 L
Post FEV1/FVC ratio: 47 %
Post FEV6/FVC ratio: 94 %
Pre FEV1/FVC ratio: 46 %
Pre FEV6/FVC Ratio: 95 %
RV % pred: 98 %
RV: 2.19 L
TLC % pred: 77 %
TLC: 5.13 L

## 2013-07-18 MED ORDER — ALBUTEROL SULFATE (5 MG/ML) 0.5% IN NEBU
2.5000 mg | INHALATION_SOLUTION | Freq: Once | RESPIRATORY_TRACT | Status: AC
  Administered 2013-07-18: 2.5 mg via RESPIRATORY_TRACT

## 2013-08-02 ENCOUNTER — Ambulatory Visit (INDEPENDENT_AMBULATORY_CARE_PROVIDER_SITE_OTHER): Payer: Medicare Other | Admitting: Cardiovascular Disease

## 2013-08-02 ENCOUNTER — Telehealth: Payer: Self-pay | Admitting: *Deleted

## 2013-08-02 ENCOUNTER — Telehealth: Payer: Self-pay

## 2013-08-02 ENCOUNTER — Encounter: Payer: Self-pay | Admitting: Cardiovascular Disease

## 2013-08-02 VITALS — BP 150/80 | HR 88 | Ht 68.0 in | Wt 149.4 lb

## 2013-08-02 DIAGNOSIS — I2510 Atherosclerotic heart disease of native coronary artery without angina pectoris: Secondary | ICD-10-CM

## 2013-08-02 DIAGNOSIS — R0989 Other specified symptoms and signs involving the circulatory and respiratory systems: Secondary | ICD-10-CM

## 2013-08-02 DIAGNOSIS — J449 Chronic obstructive pulmonary disease, unspecified: Secondary | ICD-10-CM

## 2013-08-02 MED ORDER — ALBUTEROL SULFATE HFA 108 (90 BASE) MCG/ACT IN AERS: 2.0000 | INHALATION_SPRAY | Freq: Four times a day (QID) | RESPIRATORY_TRACT | Status: DC | PRN

## 2013-08-02 NOTE — Telephone Encounter (Signed)
LMTCB ./CY   PT  HAD  PFT DONE ON 07-18-13 AT [place]  NEEDING  REPORT  WILL AWAIT RETURN CALL FROM MEDICAL RECORDS./CY

## 2013-08-02 NOTE — Progress Notes (Signed)
Patient ID: Zachary Chambers, male   DOB: 02-15-1951, 62 y.o.   MRN: [id number] 62 yo previously seen by Dr Deborah Chalk. New to me 5/12 . He has had infrequent medical F/U due to lack of insurance. Has no primary currently. CAD with stent in circ and latest cath 2003 with patent circ stent and new stent placed to LAD. Previous angina a cramping pain in chest. Has not had any of this. Smokes 1-2 ppd. CXR 01/14/11 with hyperinflation and no other abnormalities . Mild exertional dyspnea. No cough, sputum or fever. Has medicare now. Not taking lipitor Only taking asa and an over the counter acid pill. Counselled for less than 10 minutes on smoking cessation. Not motivated to quit. Active doing yard work. No palpitations, PND orthopnea. EF normal in past. Has n ano primary care doctor Lipids and liver not been checked in 2 years. Not had CXR since 6/14 reviewed and NAD  Last labs 11/14 showed LDL only 45    Says he is seeing Dr Cheree Ditto as primary  PFTls done 12/2 but not read yet.  Occasional wheezing    ROS: Denies fever, malais, weight loss, blurry vision, decreased visual acuity, cough, sputum, SOB, hemoptysis, pleuritic pain, palpitaitons, heartburn, abdominal pain, melena, lower extremity edema, claudication, or rash.  All other systems reviewed and negative  General: Affect appropriate Chronically ill white male  HEENT: normal Neck supple with no adenopathy JVP normal right  bruits no thyromegaly Lungs clear with no wheezing and good diaphragmatic motion Heart:  S1/S2 no murmur, no rub, gallop or click PMI normal Abdomen: benighn, BS positve, no tenderness, no AAA no bruit.  No HSM or HJR Distal pulses intact with no bruits No edema Neuro non-focal Skin warm and dry No muscular weakness   Current Outpatient Prescriptions  Medication Sig Dispense Refill  . aspirin 81 MG tablet Take 81 mg by mouth daily.        . Glucosamine HCl (GLUCOSAMINE PO) Take 1,500 mg by mouth daily.      . nitroGLYCERIN  (NITROSTAT) 0.4 MG SL tablet Place 0.4 mg under the tongue every 5 (five) minutes as needed for chest pain.      Marland Kitchen omeprazole (PRILOSEC) 20 MG capsule Take 40 mg by mouth daily.      . simvastatin (ZOCOR) 40 MG tablet 20 mg. TAKE ONE TABLET BY MOUTH EVERY DAY IN THE EVENING      . traMADol (ULTRAM) 50 MG tablet Take 1 tablet (50 mg total) by mouth every 8 (eight) hours as needed for pain.  30 tablet  1   No current facility-administered medications for this visit.    Allergies  Review of patient's allergies indicates no known allergies.  Electrocardiogram:  02/03/13 SR rate 78 low voltage   Assessment and Plan

## 2013-08-02 NOTE — Telephone Encounter (Signed)
Looking in pt chart this was done here. It looks like [facility]. I called christine and made her aware. Nothing further needed

## 2013-08-02 NOTE — Assessment & Plan Note (Signed)
Stable with no angina and good activity level.  Continue medical Rx  

## 2013-08-02 NOTE — Patient Instructions (Signed)
Your physician wants you to follow-up in: 6 MONTHS   WITH  DR Haywood Filler will receive a reminder letter in the mail two months in advance. If you don't receive a letter, please call our office to schedule the follow-up appointment. Your physician recommends that you continue on your current medications as directed. Please refer to the Current Medication list given to you today.  Your physician has recommended you make the following change in your medication:  MAY  TAKE  ALBUTEROL  2 PUFFS    EVERY  6 HOURS  FOR  WHEEZING  AS  NEEDED

## 2013-08-02 NOTE — Assessment & Plan Note (Signed)
No active wheezing but has sporadic episodes that warrant inhaler Have talked with Dr Maple Hudson who will read PFT;s  Discussed smoking cessation Previous nicotine patch made him sick F/U Dr Cheree Ditto

## 2013-08-08 ENCOUNTER — Ambulatory Visit (HOSPITAL_COMMUNITY): Payer: Medicare Other | Attending: Cardiovascular Disease

## 2013-08-08 DIAGNOSIS — R0989 Other specified symptoms and signs involving the circulatory and respiratory systems: Secondary | ICD-10-CM | POA: Diagnosis not present

## 2013-08-08 DIAGNOSIS — I658 Occlusion and stenosis of other precerebral arteries: Secondary | ICD-10-CM | POA: Diagnosis not present

## 2013-08-08 DIAGNOSIS — I6529 Occlusion and stenosis of unspecified carotid artery: Secondary | ICD-10-CM | POA: Diagnosis not present

## 2013-08-08 DIAGNOSIS — F17200 Nicotine dependence, unspecified, uncomplicated: Secondary | ICD-10-CM | POA: Insufficient documentation

## 2013-08-08 DIAGNOSIS — I2510 Atherosclerotic heart disease of native coronary artery without angina pectoris: Secondary | ICD-10-CM | POA: Insufficient documentation

## 2013-08-13 NOTE — Procedures (Signed)
Zachary Chambers, Zachary Chambers                   ACCOUNT NO.:  [id number]  MEDICAL RECORD NO.:  [id number]  LOCATION:  [facility]                         FACILITY:  [facility]  PHYSICIAN:  Mellody Masri L. Juanetta Gosling, M.D.DATE OF BIRTH:  [birth year]  DATE OF PROCEDURE: DATE OF DISCHARGE:  08/08/2013                           PULMONARY FUNCTION TEST   Reason for pulmonary function testing is COPD. 1. Spirometry shows a severe ventilatory defect with evidence of     airflow obstruction. 2. Lung volumes show mild reduction in total lung capacity. 3. DLCO is severely reduced. 4. Airway resistance is high confirming the presence of airflow     obstruction. 5. There is no significant bronchodilator improvement.  6.  This study     is consistent with the clinical diagnosis of COPD.     Cherry Turlington L. Juanetta Gosling, M.D.     [facility]/MEDQ  D:  08/12/2013  T:  08/13/2013  Job:  [id number]  cc:   Milinda Antis, MD

## 2013-08-25 NOTE — Procedures (Signed)
NAME:  Zachary Chambers, Zachary Chambers                        ACCOUNT NO.:  MEDICAL RECORD NO.:  [id number]  LOCATION:                                 FACILITY:  PHYSICIAN:  Nikita Humble L. Luan Pulling, M.D.DATE OF BIRTH:  04/05/1951  DATE OF PROCEDURE: DATE OF DISCHARGE:                           PULMONARY FUNCTION TEST   REASON FOR PULMONARY FUNCTION TESTING:  Chronic obstructive pulmonary disease.  RESULTS: 1. Spirometry shows a severe ventilatory defect with evidence of     airflow obstruction. 2. Lung volumes show mild reduction in total lung capacity which may     indicate a separate restrictive change. 3. DLCO is severely reduced. 4. Airway resistance is high, confirming the presence of airflow     obstruction. 5. There is no significant bronchodilator improvement. 6. This study is consistent with the clinical diagnosis of COPD.     Maureen Duesing L. Luan Pulling, M.D.     EL[facility]/MEDQ  D:  08/23/2013  T:  08/24/2013  Job:  [id number]  cc:   Dr. Buelah Manis

## 2013-09-07 NOTE — Progress Notes (Signed)
Seems like test has not been done. Can you confirm? Fwd to [facility] if has been done & not reported

## 2013-09-08 ENCOUNTER — Other Ambulatory Visit: Payer: Self-pay | Admitting: *Deleted

## 2013-09-08 DIAGNOSIS — J449 Chronic obstructive pulmonary disease, unspecified: Secondary | ICD-10-CM

## 2013-09-08 NOTE — Telephone Encounter (Signed)
PER  DR [name]  PT  NEEDS PULMONARY  APPT  DX  [facility]  COPD PT AWARE./CY

## 2013-09-19 ENCOUNTER — Ambulatory Visit: Payer: Medicare Other | Admitting: Family Medicine

## 2013-09-20 ENCOUNTER — Ambulatory Visit (INDEPENDENT_AMBULATORY_CARE_PROVIDER_SITE_OTHER): Payer: Medicare Other | Admitting: Family Medicine

## 2013-09-20 ENCOUNTER — Encounter: Payer: Self-pay | Admitting: Family Medicine

## 2013-09-20 VITALS — BP 120/80 | HR 80 | Temp 97.8°F | Resp 18 | Ht 64.0 in | Wt 150.0 lb

## 2013-09-20 DIAGNOSIS — I2510 Atherosclerotic heart disease of native coronary artery without angina pectoris: Secondary | ICD-10-CM | POA: Diagnosis not present

## 2013-09-20 DIAGNOSIS — M1990 Unspecified osteoarthritis, unspecified site: Secondary | ICD-10-CM

## 2013-09-20 DIAGNOSIS — M62830 Muscle spasm of back: Secondary | ICD-10-CM | POA: Insufficient documentation

## 2013-09-20 DIAGNOSIS — J449 Chronic obstructive pulmonary disease, unspecified: Secondary | ICD-10-CM | POA: Diagnosis not present

## 2013-09-20 DIAGNOSIS — M5380 Other specified dorsopathies, site unspecified: Secondary | ICD-10-CM

## 2013-09-20 MED ORDER — CYCLOBENZAPRINE HCL 5 MG PO TABS: 5.0000 mg | ORAL_TABLET | Freq: Three times a day (TID) | ORAL | Status: DC | PRN

## 2013-09-20 MED ORDER — SIMVASTATIN 40 MG PO TABS: 20.0000 mg | ORAL_TABLET | Freq: Every day | ORAL | Status: DC

## 2013-09-20 NOTE — Assessment & Plan Note (Addendum)
F/u with pulmonary , i faxed preliminary reading, his PFT was missed therefore has not been read by pulmonology at [facility] yet, request was made to expedite this. For now continue albuterol

## 2013-09-20 NOTE — Progress Notes (Signed)
Patient ID: Zachary Chambers, male   DOB: 1950/11/10, 63 y.o.   MRN: [id number]     Subjective:    Patient ID: Zachary Chambers, male    DOB: [birth year], 63 y.o.   MRN: [id number]  Patient presents for 3 mos follow up  He has been doing well overall. Continues to have joint pain but has only taken ultram a couple times and it did help, states he is use to having pain so forgets to take this medication. 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  COPD- PFT was done , preliminary results show severe obstruction which was reversible with bronchodilators, he has pulmonary appt next week. CAD- reviewed last cardiology note, doing well, everything has been stable    Review Of Systems:  GEN- denies fatigue, fever, weight loss,weakness, recent illness HEENT- denies eye drainage, change in vision, nasal discharge, CVS- denies chest pain, palpitations RESP- denies SOB, cough, wheeze ABD- denies N/V, change in stools, abd pain GU- denies dysuria, hematuria, dribbling, incontinence MSK- +joint pain, muscle aches, injury Neuro- denies headache, dizziness, syncope, seizure activity       Objective:    BP 120/80  Pulse 80  Temp(Src) 97.8 F (36.6 C) (Oral)  Resp 18  Ht 5\' 4"  (1.626 m)  Wt 150 lb (68.04 kg)  BMI 25.73 kg/m2 GEN- NAD, alert and oriented x3 HEENT- PERRL, EOMI, non injected sclera, pink conjunctiva, MMM, oropharynx clear CVS- RRR, no murmur RESP-CTAB ABD-NABS,soft,NT,ND MSK- Spine NT, fair ROM, no spasm of paraspinals  EXT- No edema Pulses- Radial, DP- 2+        Assessment & Plan:      Problem List Items Addressed This Visit   Muscle spasm of back     Try the zanaflex for any severe spasm, heating pad    DEGENERATIVE JOINT DISEASE     Take ultram as needed     Relevant Medications      cyclobenzaprine (FLEXERIL) tablet   COPD (chronic obstructive pulmonary disease) - Primary      F/u with pulmonary  For now continue albuterol       Note: This dictation was prepared with Dragon dictation along with smaller phrase technology. Any transcriptional errors that result from this process are unintentional.

## 2013-09-20 NOTE — Assessment & Plan Note (Signed)
Try the zanaflex for any severe spasm, heating pad

## 2013-09-20 NOTE — Assessment & Plan Note (Signed)
Take ultram as needed

## 2013-09-20 NOTE — Patient Instructions (Addendum)
Continue current medications Try the flexeril at bedtime F/U 4 months

## 2013-09-21 ENCOUNTER — Encounter: Payer: Self-pay | Admitting: Family Medicine

## 2013-09-21 NOTE — Assessment & Plan Note (Signed)
Reviewed cardiology note, no changes 

## 2013-09-26 ENCOUNTER — Ambulatory Visit (INDEPENDENT_AMBULATORY_CARE_PROVIDER_SITE_OTHER): Payer: Medicare Other | Admitting: Emergency Medicine

## 2013-09-26 ENCOUNTER — Encounter: Payer: Self-pay | Admitting: Emergency Medicine

## 2013-09-26 VITALS — BP 152/78 | HR 71 | Ht 68.0 in | Wt 149.8 lb

## 2013-09-26 DIAGNOSIS — J449 Chronic obstructive pulmonary disease, unspecified: Secondary | ICD-10-CM

## 2013-09-26 NOTE — Progress Notes (Signed)
Subjective:    Patient ID: Zachary Chambers, male    DOB: [birth year], 63 y.o.   MRN: [id number]  HPI 63 yo man, smoker (54 pk-yrs), hx of CAD + PTCI, GERD.  Followed by Drs Buelah Manis and [place]. 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 His back pain limits him also. He still smokes 1.0- 1.5 pk a day. He has occasional cough, not every day. Occasionally wheezes, especially at night when laying supine. Has a SABA, has never used it.    Review of Systems  Constitutional: Negative for fever and unexpected weight change.  HENT: Negative for congestion, dental problem, ear pain, nosebleeds, postnasal drip, rhinorrhea, sinus pressure, sneezing, sore throat and trouble swallowing.   Eyes: Negative for redness and itching.  Respiratory: Positive for cough, shortness of breath and wheezing. Negative for chest tightness.   Cardiovascular: Negative for palpitations and leg swelling.  Gastrointestinal: Negative for nausea and vomiting.  Genitourinary: Negative for dysuria.  Musculoskeletal: Negative for joint swelling.  Skin: Negative for rash.  Neurological: Negative for headaches.  Hematological: Bruises/bleeds easily.  Psychiatric/Behavioral: Negative for dysphoric mood. The patient is not nervous/anxious.     Past Medical History  Diagnosis Date  . MI (myocardial infarction) 2006    small MI and stents x 3  Dr. Doreatha Lew  . PUD (peptic ulcer disease)     with bleeding  . Rotator cuff disorder     has been evaluated by Dr Clifton James and Koleen Distance  . DJD (degenerative joint disease)     diffusely  . GERD (gastroesophageal reflux disease)   . Hyperlipidemia      Family History  Problem Relation Age of Onset  . Colon cancer Neg Hx   . Esophageal cancer Neg Hx   . Rectal cancer Neg Hx   . Stomach cancer Neg Hx   . Ovarian cancer Mother   . Diabetes Maternal Grandfather   . COPD Father   . Diabetes Paternal Grandfather       History   Social History  . Marital Status: Single    Spouse Name: N/A    Number of Children: N/A  . Years of Education: N/A   Occupational History  . Not on file.   Social History Main Topics  . Smoking status: Current Every Day Smoker -- 1.50 packs/day for 40 years    Types: Cigarettes  . Smokeless tobacco: Never Used  . Alcohol Use: 18.0 oz/week    30 Cans of beer per week     Comment: carton of beer weekly  . Drug Use: No  . Sexual Activity: Yes   Other Topics Concern  . Not on file   Social History Narrative  . 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  No military Has lived in [place]  No Known Allergies   Outpatient Prescriptions Prior to Visit  Medication Sig Dispense Refill  . albuterol (PROVENTIL HFA;VENTOLIN HFA) 108 (90 BASE) MCG/ACT inhaler Inhale 2 puffs into the lungs every 6 (six) hours as needed for wheezing or shortness of breath.  1 Inhaler  2  . aspirin 81 MG tablet Take 81 mg by mouth daily.        . cyclobenzaprine (FLEXERIL) 5 MG tablet Take 1 tablet (5 mg total) by mouth 3 (three) times daily as needed for muscle spasms.  20 tablet  1  . Glucosamine HCl (GLUCOSAMINE PO) Take 1,500 mg by mouth daily.      . nitroGLYCERIN (NITROSTAT) 0.4 MG SL tablet Place 0.4 mg under the tongue every 5 (five) minutes as needed for chest pain.      Marland Kitchen omeprazole (PRILOSEC) 20 MG capsule Take 40 mg by mouth daily.      . simvastatin (ZOCOR) 40 MG tablet Take 0.5 tablets (20 mg total) by mouth at bedtime. TAKE ONE TABLET BY MOUTH EVERY DAY IN THE EVENING  30 tablet  3  . traMADol (ULTRAM) 50 MG tablet Take 1 tablet (50 mg total) by mouth every 8 (eight) hours as needed for pain.  30 tablet  1   No facility-administered medications prior to visit.         Objective:   Physical Exam Filed Vitals:   09/26/13 1010  BP: 152/78  Pulse: 71  Height: 5\' 8"  (1.727 m)  Weight: 149  lb 12.8 oz (67.949 kg)  SpO2: 98%   Gen: Pleasant, elderly man, in no distress,  normal affect  ENT: No lesions,  mouth clear,  oropharynx clear, no postnasal drip  Neck: No JVD, no TMG, no carotid bruits  Lungs: No use of accessory muscles, distant, clear without rales or rhonchi  Cardiovascular: RRR, heart sounds normal, no murmur or gallops, no peripheral edema  Musculoskeletal: No deformities, no cyanosis or clubbing  Neuro: alert, non focal  Skin: Warm, no lesions or rashes     Assessment & Plan:  COPD (chronic obstructive pulmonary disease) 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 Continued tobacco. He has minimal dyspnea, but his exertional tolerance is decreased.  - trial spiriva  - discussed tobacco cessation.  - rov 1

## 2013-09-26 NOTE — Assessment & Plan Note (Signed)
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 Continued tobacco. He has minimal dyspnea, but his exertional tolerance is decreased.  - trial spiriva  - discussed tobacco cessation.  - rov 1

## 2013-09-26 NOTE — Patient Instructions (Signed)
You breathing tests show severe abnormal airflow with evidence of COPD We will try a new medications, Spiriva 1 inhalation daily until your next visit. We will discuss whether it has been beneficial at that time.  You need to work on cutting down your cigarettes. The best thing would be to stop altogether. We can discuss this more in the future.  Follow with Dr Lamonte Sakai in 1 month

## 2013-10-31 ENCOUNTER — Ambulatory Visit (INDEPENDENT_AMBULATORY_CARE_PROVIDER_SITE_OTHER): Payer: Medicare Other | Admitting: Emergency Medicine

## 2013-10-31 ENCOUNTER — Encounter: Payer: Self-pay | Admitting: Emergency Medicine

## 2013-10-31 ENCOUNTER — Telehealth: Payer: Self-pay | Admitting: Emergency Medicine

## 2013-10-31 VITALS — BP 148/64 | HR 97 | Ht 68.0 in | Wt 148.6 lb

## 2013-10-31 DIAGNOSIS — I2510 Atherosclerotic heart disease of native coronary artery without angina pectoris: Secondary | ICD-10-CM

## 2013-10-31 DIAGNOSIS — F17200 Nicotine dependence, unspecified, uncomplicated: Secondary | ICD-10-CM

## 2013-10-31 DIAGNOSIS — J449 Chronic obstructive pulmonary disease, unspecified: Secondary | ICD-10-CM | POA: Diagnosis not present

## 2013-10-31 DIAGNOSIS — J4489 Other specified chronic obstructive pulmonary disease: Secondary | ICD-10-CM

## 2013-10-31 MED ORDER — TIOTROPIUM BROMIDE MONOHYDRATE 18 MCG IN CAPS: 18.0000 ug | ORAL_CAPSULE | Freq: Every day | RESPIRATORY_TRACT | Status: DC

## 2013-10-31 NOTE — Assessment & Plan Note (Signed)
He has contemplated cessation. Hasn't made a plan to stop, but has planned to cut down. I discussed with him the next steps for cessation. He is considering this.

## 2013-10-31 NOTE — Progress Notes (Signed)
Subjective:    Patient ID: Zachary Chambers, male    DOB: [birth year], 63 y.o.   MRN: [id number]  HPI 63 yo man, smoker (70 pk-yrs), hx of CAD + PTCI, GERD.  Followed by Drs Buelah Manis and [place]. He has had PFT performed at 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 His back pain limits him also. He still smokes 1.0- 1.5 pk a day. He has occasional cough, not every day. Occasionally wheezes, especially at night when laying supine. Has a SABA, has never used it.   ROV 10/31/13 -- follow up for COPD. Last time we started spiriva, discussed smoking cessation. He feels that the spiriva has helped > less rattling, less wheeze, able to do a bit more. He is coughing, may be a bit more since he started the spiriva. He has never used his SABA. Still smoking same amt 1-1.5pk/day   Review of Systems  Constitutional: Negative for fever and unexpected weight change.  HENT: Negative for congestion, dental problem, ear pain, nosebleeds, postnasal drip, rhinorrhea, sinus pressure, sneezing, sore throat and trouble swallowing.   Eyes: Negative for redness and itching.  Respiratory: Positive for cough, shortness of breath and wheezing. Negative for chest tightness.   Cardiovascular: Negative for palpitations and leg swelling.  Gastrointestinal: Negative for nausea and vomiting.  Genitourinary: Negative for dysuria.  Musculoskeletal: Negative for joint swelling.  Skin: Negative for rash.  Neurological: Negative for headaches.  Hematological: Bruises/bleeds easily.  Psychiatric/Behavioral: Negative for dysphoric mood. The patient is not nervous/anxious.     Past Medical History  Diagnosis Date  . MI (myocardial infarction) 2006    small MI and stents x 3  Dr. Doreatha Lew  . PUD (peptic ulcer disease)     with bleeding  . Rotator cuff disorder     has been evaluated by Dr Clifton James and Koleen Distance  . DJD (degenerative joint disease)     diffusely  . GERD (gastroesophageal reflux  disease)   . Hyperlipidemia      Family History  Problem Relation Age of Onset  . Colon cancer Neg Hx   . Esophageal cancer Neg Hx   . Rectal cancer Neg Hx   . Stomach cancer Neg Hx   . Ovarian cancer Mother   . Diabetes Maternal Grandfather   . COPD Father   . Diabetes Paternal Grandfather      History   Social History  . Marital Status: Single    Spouse Name: N/A    Number of Children: N/A  . Years of Education: N/A   Occupational History  . Not on file.   Social History Main Topics  . Smoking status: Current Every Day Smoker -- 1.50 packs/day for 40 years    Types: Cigarettes  . Smokeless tobacco: Never Used  . Alcohol Use: 18.0 oz/week    30 Cans of beer per week     Comment: carton of beer weekly  . Drug Use: No  . Sexual Activity: Yes   Other Topics Concern  . Not on file   Social History Narrative  . 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  No military Has lived in [place]  No Known Allergies   Outpatient Prescriptions Prior to Visit  Medication Sig Dispense Refill  . albuterol (PROVENTIL HFA;VENTOLIN HFA) 108 (90 BASE) MCG/ACT inhaler Inhale 2 puffs into the lungs every 6 (six) hours as  needed for wheezing or shortness of breath.  1 Inhaler  2  . aspirin 81 MG tablet Take 81 mg by mouth daily.        . cyclobenzaprine (FLEXERIL) 5 MG tablet Take 1 tablet (5 mg total) by mouth 3 (three) times daily as needed for muscle spasms.  20 tablet  1  . Glucosamine HCl (GLUCOSAMINE PO) Take 1,500 mg by mouth daily.      . nitroGLYCERIN (NITROSTAT) 0.4 MG SL tablet Place 0.4 mg under the tongue every 5 (five) minutes as needed for chest pain.      Marland Kitchen omeprazole (PRILOSEC) 20 MG capsule Take 40 mg by mouth daily.      . simvastatin (ZOCOR) 40 MG tablet Take 0.5 tablets (20 mg total) by mouth at bedtime. TAKE ONE TABLET BY MOUTH EVERY DAY IN THE EVENING  30 tablet  3  . traMADol  (ULTRAM) 50 MG tablet Take 1 tablet (50 mg total) by mouth every 8 (eight) hours as needed for pain.  30 tablet  1   No facility-administered medications prior to visit.         Objective:   Physical Exam Filed Vitals:   10/31/13 1158  BP: 148/64  Pulse: 97  Height: 5\' 8"  (1.727 m)  Weight: 148 lb 9.6 oz (67.405 kg)  SpO2: 96%   Gen: Pleasant, elderly man, in no distress,  normal affect  ENT: No lesions,  mouth clear,  oropharynx clear, no postnasal drip  Neck: No JVD, no TMG, no carotid bruits  Lungs: No use of accessory muscles, distant, clear without rales or rhonchi  Cardiovascular: RRR, heart sounds normal, no murmur or gallops, no peripheral edema  Musculoskeletal: No deformities, no cyanosis or clubbing  Neuro: alert, non focal  Skin: Warm, no lesions or rashes     Assessment & Plan:  COPD (chronic obstructive pulmonary disease) - will continue spiriva  - SABA prn -   TOBACCO ABUSE He has contemplated cessation. Hasn't made a plan to stop, but has planned to cut down. I discussed with him the next steps for cessation. He is considering this.

## 2013-10-31 NOTE — Patient Instructions (Signed)
Please continue Spiriva every day. A prescription was sent to your pharmacy Use albuterol as needed Continue to work on a plan to decrease your cigarettes Follow with Dr Lamonte Sakai in 4 months or sooner if you have any problems.

## 2013-10-31 NOTE — Assessment & Plan Note (Signed)
-   will continue spiriva  - SABA prn -

## 2013-10-31 NOTE — Telephone Encounter (Signed)
Spoke with pt. He reports the spiriva was over $300. He can't afford this. He has medicare and will not qualify for patient assistance. He reports RB told him to call if the price was expensive. Please advise thanks

## 2013-11-01 ENCOUNTER — Telehealth: Payer: Self-pay | Admitting: Emergency Medicine

## 2013-11-01 DIAGNOSIS — J449 Chronic obstructive pulmonary disease, unspecified: Secondary | ICD-10-CM

## 2013-11-01 MED ORDER — TIOTROPIUM BROMIDE MONOHYDRATE 18 MCG IN CAPS: 18.0000 ug | ORAL_CAPSULE | Freq: Every day | RESPIRATORY_TRACT | Status: DC

## 2013-11-01 NOTE — Telephone Encounter (Signed)
I spoke with the pt and he states that he does not have any insurance and spiriva is too expensive. I spoke with the pt and advised of PA program through spiriva. He has not applied for this before so I advised I can leave him some samples at the front along with the forms for PA. I advised the pt to complete the forms and bring back with income info and we will process this. Samples at front. [place] Bing, CMA

## 2013-11-02 NOTE — Telephone Encounter (Signed)
Pt called in again on 11-01-13 and this was handled, nothing further needed. Mina Bing, CMA

## 2013-11-06 ENCOUNTER — Telehealth: Payer: Self-pay | Admitting: Emergency Medicine

## 2013-11-06 NOTE — Telephone Encounter (Signed)
I have received these papers and will have RB sign 3/24 and will fax them at that time. Will notify pt on 3/24 once they have been signed and faxed.

## 2013-11-08 NOTE — Telephone Encounter (Signed)
Advised pt that RB had signed and papers had been faxed back. Just waiting on decision.

## 2013-11-20 ENCOUNTER — Telehealth: Payer: Self-pay | Admitting: Emergency Medicine

## 2013-11-20 DIAGNOSIS — J449 Chronic obstructive pulmonary disease, unspecified: Secondary | ICD-10-CM

## 2013-11-20 MED ORDER — TIOTROPIUM BROMIDE MONOHYDRATE 18 MCG IN CAPS: 18.0000 ug | ORAL_CAPSULE | Freq: Every day | RESPIRATORY_TRACT | Status: DC

## 2013-11-20 NOTE — Telephone Encounter (Signed)
RB has signed and I have put this to be mailed

## 2013-11-20 NOTE — Telephone Encounter (Signed)
Spoke with pt. He is requesting to pick up RX for spiriva to mail off with the documents he has. He did not want me to mail this or fax it for him. Please call when ready for pick up. thanks

## 2014-01-22 ENCOUNTER — Ambulatory Visit (INDEPENDENT_AMBULATORY_CARE_PROVIDER_SITE_OTHER): Payer: Medicare Other | Admitting: Cardiovascular Disease

## 2014-01-22 ENCOUNTER — Encounter: Payer: Self-pay | Admitting: Cardiovascular Disease

## 2014-01-22 VITALS — BP 137/75 | HR 87 | Ht 68.0 in | Wt 140.8 lb

## 2014-01-22 DIAGNOSIS — R0989 Other specified symptoms and signs involving the circulatory and respiratory systems: Secondary | ICD-10-CM | POA: Diagnosis not present

## 2014-01-22 DIAGNOSIS — J4489 Other specified chronic obstructive pulmonary disease: Secondary | ICD-10-CM

## 2014-01-22 DIAGNOSIS — I2510 Atherosclerotic heart disease of native coronary artery without angina pectoris: Secondary | ICD-10-CM

## 2014-01-22 DIAGNOSIS — J449 Chronic obstructive pulmonary disease, unspecified: Secondary | ICD-10-CM | POA: Diagnosis not present

## 2014-01-22 NOTE — Patient Instructions (Signed)
Your physician wants you to follow-up in: YEAR WITH DR NISHAN  You will receive a reminder letter in the mail two months in advance. If you don't receive a letter, please call our office to schedule the follow-up appointment.  Your physician recommends that you continue on your current medications as directed. Please refer to the Current Medication list given to you today. 

## 2014-01-22 NOTE — Assessment & Plan Note (Signed)
Primary health issue  F/U Dr Lamonte Sakai  Encouraged him to get flu shots early  Continue Spiriva

## 2014-01-22 NOTE — Assessment & Plan Note (Signed)
Stable with no angina and good activity level.  Continue medical Rx  

## 2014-01-22 NOTE — Progress Notes (Signed)
Patient ID: Zachary Chambers, male   DOB: 1951/05/02, 63 y.o.   MRN: [id number] 63 yo previously seen by Dr Doreatha Lew. New to me 5/12 . He has had infrequent medical F/U due to lack of insurance. Has no primary currently. CAD with stent in circ and latest cath 2003 with patent circ stent and new stent placed to LAD. Previous angina a cramping pain in chest. Has not had any of this. Smokes 1-2 ppd. CXR 01/14/11 with hyperinflation and no other abnormalities . Mild exertional dyspnea. No cough, sputum or fever. Has medicare now. Not taking lipitor Only taking asa and an over the counter acid pill. Counselled for less than 10 minutes on smoking cessation. Not motivated to quit. Active doing yard work. No palpitations, PND orthopnea. EF normal in past. Has n ano primary care doctor Lipids and liver not been checked in 2 years. Not had CXR since 6/14 reviewed and NAD Last labs 11/14 showed LDL only 45   Says he is seeing Dr Phillip Heal as primary PFTls done 12/2 but not read yet. Occasional wheezing   Seen by [facility] for COPD in March some improvement with Spiriva  Carotids 12/14 with midl plaque no significant stenosis    ROS: Denies fever, malais, weight loss, blurry vision, decreased visual acuity, cough, sputum, SOB, hemoptysis, pleuritic pain, palpitaitons, heartburn, abdominal pain, melena, lower extremity edema, claudication, or rash.  All other systems reviewed and negative  General: Affect appropriate Chronically ill male  HEENT: normal Neck supple with no adenopathy JVP normal right  bruits no thyromegaly Lungs diffuse rhonchi and wheezing and good diaphragmatic motion Heart:  S1/S2 no murmur, no rub, gallop or click PMI normal Abdomen: benighn, BS positve, no tenderness, no AAA no bruit.  No HSM or HJR Distal pulses intact with no bruits No edema Neuro non-focal Skin warm and dry No muscular weakness   Current Outpatient Prescriptions  Medication Sig Dispense Refill  . albuterol (PROVENTIL  HFA;VENTOLIN HFA) 108 (90 BASE) MCG/ACT inhaler Inhale 2 puffs into the lungs every 6 (six) hours as needed for wheezing or shortness of breath.  1 Inhaler  2  . aspirin 81 MG tablet Take 81 mg by mouth daily.        . cyclobenzaprine (FLEXERIL) 5 MG tablet Take 1 tablet (5 mg total) by mouth 3 (three) times daily as needed for muscle spasms.  20 tablet  1  . Glucosamine HCl (GLUCOSAMINE PO) Take 1,500 mg by mouth daily.      . nitroGLYCERIN (NITROSTAT) 0.4 MG SL tablet Place 0.4 mg under the tongue every 5 (five) minutes as needed for chest pain.      Marland Kitchen omeprazole (PRILOSEC) 20 MG capsule Take 40 mg by mouth daily.      . simvastatin (ZOCOR) 40 MG tablet Take 0.5 tablets (20 mg total) by mouth at bedtime. TAKE ONE TABLET BY MOUTH EVERY DAY IN THE EVENING  30 tablet  3  . tiotropium (SPIRIVA) 18 MCG inhalation capsule Place 1 capsule (18 mcg total) into inhaler and inhale daily.  90 capsule  3  . traMADol (ULTRAM) 50 MG tablet Take 1 tablet (50 mg total) by mouth every 8 (eight) hours as needed for pain.  30 tablet  1   No current facility-administered medications for this visit.    Allergies  Review of patient's allergies indicates no known allergies.  Electrocardiogram:  SR rate 87 normal ECG   Assessment and Plan

## 2014-01-22 NOTE — Assessment & Plan Note (Signed)
Duplex 1`2/14 with plaque and no significant stenosis  F/U duplex 12/16  ASA

## 2014-01-29 ENCOUNTER — Ambulatory Visit (INDEPENDENT_AMBULATORY_CARE_PROVIDER_SITE_OTHER): Payer: Medicare Other | Admitting: Family Medicine

## 2014-01-29 ENCOUNTER — Encounter: Payer: Self-pay | Admitting: Family Medicine

## 2014-01-29 VITALS — BP 122/64 | HR 82 | Temp 98.2°F | Resp 16 | Ht 66.0 in | Wt 145.0 lb

## 2014-01-29 DIAGNOSIS — I2510 Atherosclerotic heart disease of native coronary artery without angina pectoris: Secondary | ICD-10-CM | POA: Diagnosis not present

## 2014-01-29 DIAGNOSIS — J441 Chronic obstructive pulmonary disease with (acute) exacerbation: Secondary | ICD-10-CM | POA: Diagnosis not present

## 2014-01-29 DIAGNOSIS — J019 Acute sinusitis, unspecified: Secondary | ICD-10-CM | POA: Diagnosis not present

## 2014-01-29 MED ORDER — PREDNISONE 10 MG PO TABS: ORAL_TABLET | ORAL | Status: DC

## 2014-01-29 MED ORDER — DOXYCYCLINE HYCLATE 100 MG PO TABS: 100.0000 mg | ORAL_TABLET | Freq: Two times a day (BID) | ORAL | Status: DC

## 2014-01-29 NOTE — Progress Notes (Signed)
Patient ID: Zachary Chambers, male   DOB: July 14, 1951, 63 y.o.   MRN: [id number]   Subjective:    Patient ID: Zachary Chambers, male    DOB: 05/02/1951, 63 y.o.   MRN: [id number]  Patient presents for Illness  patient here with cough with production nasal congestion sinus pressure for the past 2 weeks. Positive sick contact with his grandchildren. He also had a couple days of fever last week which is now resolved. He gets severe coughing fits and cannot stop. He felt a little short of breath at times. He is using his Spiriva as well as his albuterol as needed. He's not had any chest pain. She will be due for fasting labs in the next couple weeks Medications reviewed    Review Of Systems:  GEN- denies fatigue, fever, weight loss,weakness, recent illness HEENT- denies eye drainage, change in vision, +nasal discharge, CVS- denies chest pain, palpitations RESP- + SOB, +cough, +wheeze ABD- denies N/V, change in stools, abd pain GU- denies dysuria, hematuria, dribbling, incontinence MSK- denies joint pain, muscle aches, injury Neuro- denies headache, dizziness, syncope, seizure activity       Objective:    BP 122/64  Pulse 82  Temp(Src) 98.2 F (36.8 C) (Oral)  Resp 16  Ht 5\' 6"  (1.676 m)  Wt 145 lb (65.772 kg)  BMI 23.41 kg/m2  SpO2 96% GEN- NAD, alert and oriented x3 HEENT- PERRL, EOMI, non injected sclera, pink conjunctiva, MMM, oropharynx clear, clear rhinorrhea, mild maxillary sinus tenderness Neck- Supple, no LAD CVS- RRR, no murmur RESP-few scattered wheeze, mild rhonchi, normal WOB, harsh cough EXT- No edema Pulses- Radial 2+        Assessment & Plan:      Problem List Items Addressed This Visit   COPD exacerbation - Primary   Relevant Medications      predniSONE (DELTASONE) tablet   Acute sinusitis   Relevant Medications      predniSONE (DELTASONE) tablet      doxycycline (VIBRA-TABS) tablet 100 mg      Note: This dictation was prepared with Dragon dictation along  with smaller phrase technology. Any transcriptional errors that result from this process are unintentional.

## 2014-01-29 NOTE — Assessment & Plan Note (Signed)
He'll return for fasting blood work. He recently saw his cardiologist note reviewed

## 2014-01-29 NOTE — Patient Instructions (Signed)
Start antibiotics Get the mucinex DM , take 1 tablet BID  Start prednisone  Return 2-3 for fasting labs F/U 4 months

## 2014-01-29 NOTE — Assessment & Plan Note (Signed)
Per above 

## 2014-01-29 NOTE — Assessment & Plan Note (Signed)
I think a viral sinus infection started off for COPD exacerbation. I will have him continue his rescue inhaler the Spiriva I will add prednisone as well as antibiotics with his recent fever. His oxygen saturations look okay for him to be treated as an outpatient

## 2014-02-22 ENCOUNTER — Other Ambulatory Visit: Payer: Medicare Other

## 2014-02-22 DIAGNOSIS — I2510 Atherosclerotic heart disease of native coronary artery without angina pectoris: Secondary | ICD-10-CM

## 2014-02-22 LAB — CBC WITH DIFFERENTIAL/PLATELET
Basophils Absolute: 0.1 10*3/uL (ref 0.0–0.1)
Basophils Relative: 2 % — ABNORMAL HIGH (ref 0–1)
Eosinophils Absolute: 0 10*3/uL (ref 0.0–0.7)
Eosinophils Relative: 1 % (ref 0–5)
HCT: 36.8 % — ABNORMAL LOW (ref 39.0–52.0)
Hemoglobin: 12.8 g/dL — ABNORMAL LOW (ref 13.0–17.0)
Lymphocytes Relative: 39 % (ref 12–46)
Lymphs Abs: 1.3 10*3/uL (ref 0.7–4.0)
MCH: 32.3 pg (ref 26.0–34.0)
MCHC: 34.8 g/dL (ref 30.0–36.0)
MCV: 92.9 fL (ref 78.0–100.0)
Monocytes Absolute: 0.4 10*3/uL (ref 0.1–1.0)
Monocytes Relative: 11 % (ref 3–12)
Neutro Abs: 1.6 10*3/uL — ABNORMAL LOW (ref 1.7–7.7)
Neutrophils Relative %: 47 % (ref 43–77)
Platelets: 308 10*3/uL (ref 150–400)
RBC: 3.96 MIL/uL — ABNORMAL LOW (ref 4.22–5.81)
RDW: 13.9 % (ref 11.5–15.5)
WBC: 3.4 10*3/uL — ABNORMAL LOW (ref 4.0–10.5)

## 2014-02-23 LAB — COMPREHENSIVE METABOLIC PANEL
ALT: 9 U/L (ref 0–53)
AST: 20 U/L (ref 0–37)
Albumin: 4 g/dL (ref 3.5–5.2)
Alkaline Phosphatase: 54 U/L (ref 39–117)
BUN: 3 mg/dL — ABNORMAL LOW (ref 6–23)
CO2: 23 mEq/L (ref 19–32)
Calcium: 9.1 mg/dL (ref 8.4–10.5)
Chloride: 99 mEq/L (ref 96–112)
Creat: 0.7 mg/dL (ref 0.50–1.35)
Glucose, Bld: 95 mg/dL (ref 70–99)
Potassium: 4.4 mEq/L (ref 3.5–5.3)
Sodium: 132 mEq/L — ABNORMAL LOW (ref 135–145)
Total Bilirubin: 0.5 mg/dL (ref 0.2–1.2)
Total Protein: 6.5 g/dL (ref 6.0–8.3)

## 2014-02-23 LAB — LIPID PANEL
Cholesterol: 144 mg/dL (ref 0–200)
HDL: 77 mg/dL (ref >39)
LDL Cholesterol: 58 mg/dL (ref 0–99)
Total CHOL/HDL Ratio: 1.9 Ratio
Triglycerides: 44 mg/dL (ref <150)
VLDL: 9 mg/dL (ref 0–40)

## 2014-02-26 ENCOUNTER — Encounter: Payer: Self-pay | Admitting: *Deleted

## 2014-03-26 ENCOUNTER — Encounter: Payer: Self-pay | Admitting: Internal Medicine

## 2014-04-06 ENCOUNTER — Encounter: Payer: Self-pay | Admitting: Emergency Medicine

## 2014-04-06 ENCOUNTER — Ambulatory Visit (INDEPENDENT_AMBULATORY_CARE_PROVIDER_SITE_OTHER): Payer: Medicare Other | Admitting: Emergency Medicine

## 2014-04-06 VITALS — BP 142/74 | HR 73 | Ht 67.0 in | Wt 146.0 lb

## 2014-04-06 DIAGNOSIS — I2510 Atherosclerotic heart disease of native coronary artery without angina pectoris: Secondary | ICD-10-CM

## 2014-04-06 DIAGNOSIS — J449 Chronic obstructive pulmonary disease, unspecified: Secondary | ICD-10-CM

## 2014-04-06 NOTE — Assessment & Plan Note (Signed)
Continue same med regimen We discussed LDCT screening today. He understands the rationale. Would like to discuss with his insurance company and consider.  Discussed smoking cessation.

## 2014-04-06 NOTE — Patient Instructions (Signed)
Please continue your Spiriva, and albuterol if you need it We discuss cutting down smoking today We discussed the potential role for low-dose CT scan screening for lung cancer. We will revisiot this next visit Follow with Dr Lamonte Sakai in 6 months or sooner if you have any problems

## 2014-04-06 NOTE — Progress Notes (Signed)
Subjective:    Patient ID: Zachary Chambers, male    DOB: 07-22-1951, 63 y.o.   MRN: [id number]  HPI 63 yo man, smoker (17 pk-yrs), hx of CAD + PTCI, GERD.  Followed by Drs Buelah Manis and [place]. 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 His back pain limits him also. He still smokes 1.0- 1.5 pk a day. He has occasional cough, not every day. Occasionally wheezes, especially at night when laying supine. Has a SABA, has never used it.   ROV 10/31/13 -- follow up for COPD. Last time we started spiriva, discussed smoking cessation. He feels that the spiriva has helped > less rattling, less wheeze, able to do a bit more. He is coughing, may be a bit more since he started the spiriva. He has never used his SABA. Still smoking same amt 1-1.5pk/day  ROV 04/06/14 -- follow up visit for tobacco use and COPD. He has benefited from Spiriva.. He continues to smoke - rolls his own. He has cut down. He isn't ready to set a quit. He still gardens, still active. Discussed LDCT screening today.    Review of Systems  Constitutional: Negative for fever and unexpected weight change.  HENT: Negative for congestion, dental problem, ear pain, nosebleeds, postnasal drip, rhinorrhea, sinus pressure, sneezing, sore throat and trouble swallowing.   Eyes: Negative for redness and itching.  Respiratory: Positive for cough, shortness of breath and wheezing. Negative for chest tightness.   Cardiovascular: Negative for palpitations and leg swelling.  Gastrointestinal: Negative for nausea and vomiting.  Genitourinary: Negative for dysuria.  Musculoskeletal: Negative for joint swelling.  Skin: Negative for rash.  Neurological: Negative for headaches.  Hematological: Bruises/bleeds easily.  Psychiatric/Behavioral: Negative for dysphoric mood. The patient is not nervous/anxious.     Past Medical History  Diagnosis Date  . MI (myocardial infarction) 2006    small MI  and stents x 3  Dr. Doreatha Lew  . PUD (peptic ulcer disease)     with bleeding  . Rotator cuff disorder     has been evaluated by Dr Clifton James and Koleen Distance  . DJD (degenerative joint disease)     diffusely  . GERD (gastroesophageal reflux disease)   . Hyperlipidemia      Family History  Problem Relation Age of Onset  . Colon cancer Neg Hx   . Esophageal cancer Neg Hx   . Rectal cancer Neg Hx   . Stomach cancer Neg Hx   . Ovarian cancer Mother   . Diabetes Maternal Grandfather   . COPD Father   . Diabetes Paternal Grandfather      History   Social History  . Marital Status: Single    Spouse Name: N/A    Number of Children: N/A  . Years of Education: N/A   Occupational History  . Not on file.   Social History Main Topics  . Smoking status: Current Every Day Smoker -- 1.50 packs/day for 40 years    Types: Cigarettes  . Smokeless tobacco: Never Used  . Alcohol Use: 18.0 oz/week    30 Cans of beer per week     Comment: carton of beer weekly  . Drug Use: No  . Sexual Activity: Yes   Other Topics Concern  . Not on file   Social History Narrative  . 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  No military Has lived in [place]  No Known Allergies   Outpatient Prescriptions Prior to Visit  Medication Sig Dispense Refill  . albuterol (PROVENTIL HFA;VENTOLIN HFA) 108 (90 BASE) MCG/ACT inhaler Inhale 2 puffs into the lungs every 6 (six) hours as needed for wheezing or shortness of breath.  1 Inhaler  2  . aspirin 81 MG tablet Take 81 mg by mouth daily.        . cyclobenzaprine (FLEXERIL) 5 MG tablet Take 1 tablet (5 mg total) by mouth 3 (three) times daily as needed for muscle spasms.  20 tablet  1  . Glucosamine HCl (GLUCOSAMINE PO) Take 1,500 mg by mouth daily.      . nitroGLYCERIN (NITROSTAT) 0.4 MG SL tablet Place 0.4 mg under the tongue every 5 (five) minutes as needed for chest pain.       Marland Kitchen omeprazole (PRILOSEC) 20 MG capsule Take 40 mg by mouth daily.      . simvastatin (ZOCOR) 40 MG tablet Take 0.5 tablets (20 mg total) by mouth at bedtime. TAKE ONE TABLET BY MOUTH EVERY DAY IN THE EVENING  30 tablet  3  . tiotropium (SPIRIVA) 18 MCG inhalation capsule Place 1 capsule (18 mcg total) into inhaler and inhale daily.  90 capsule  3  . traMADol (ULTRAM) 50 MG tablet Take 1 tablet (50 mg total) by mouth every 8 (eight) hours as needed for pain.  30 tablet  1  . doxycycline (VIBRA-TABS) 100 MG tablet Take 1 tablet (100 mg total) by mouth 2 (two) times daily.  14 tablet  0  . predniSONE (DELTASONE) 10 MG tablet Take 40mg  x 3 days,20mg  x 3 days, 10mg  x 3 days  21 tablet  0   No facility-administered medications prior to visit.         Objective:   Physical Exam Filed Vitals:   04/06/14 1058  BP: 142/74  Pulse: 73  Height: 5\' 7"  (1.702 m)  Weight: 146 lb (66.225 kg)  SpO2: 97%   Gen: Pleasant, elderly man, in no distress,  normal affect  ENT: No lesions,  mouth clear,  oropharynx clear, no postnasal drip  Neck: No JVD, no TMG, no carotid bruits  Lungs: No use of accessory muscles, distant, clear without rales or rhonchi  Cardiovascular: RRR, heart sounds normal, no murmur or gallops, no peripheral edema  Musculoskeletal: No deformities, no cyanosis or clubbing  Neuro: alert, non focal  Skin: Warm, no lesions or rashes     Assessment & Plan:  COPD (chronic obstructive pulmonary disease) Continue same med regimen We discussed LDCT screening today. He understands the rationale. Would like to discuss with his insurance company and consider.  Discussed smoking cessation.

## 2014-08-03 ENCOUNTER — Other Ambulatory Visit (HOSPITAL_COMMUNITY): Payer: Self-pay | Admitting: *Deleted

## 2014-08-03 DIAGNOSIS — I6523 Occlusion and stenosis of bilateral carotid arteries: Secondary | ICD-10-CM

## 2014-08-15 ENCOUNTER — Ambulatory Visit (INDEPENDENT_AMBULATORY_CARE_PROVIDER_SITE_OTHER): Payer: Commercial Managed Care - HMO | Admitting: Family Medicine

## 2014-08-15 ENCOUNTER — Encounter: Payer: Self-pay | Admitting: Family Medicine

## 2014-08-15 VITALS — BP 132/78 | HR 78 | Temp 98.0°F | Resp 16 | Ht 67.0 in | Wt 148.0 lb

## 2014-08-15 DIAGNOSIS — J449 Chronic obstructive pulmonary disease, unspecified: Secondary | ICD-10-CM

## 2014-08-15 DIAGNOSIS — Z720 Tobacco use: Secondary | ICD-10-CM

## 2014-08-15 DIAGNOSIS — I25110 Atherosclerotic heart disease of native coronary artery with unstable angina pectoris: Secondary | ICD-10-CM

## 2014-08-15 DIAGNOSIS — I2510 Atherosclerotic heart disease of native coronary artery without angina pectoris: Secondary | ICD-10-CM

## 2014-08-15 DIAGNOSIS — F17200 Nicotine dependence, unspecified, uncomplicated: Secondary | ICD-10-CM

## 2014-08-15 DIAGNOSIS — K219 Gastro-esophageal reflux disease without esophagitis: Secondary | ICD-10-CM

## 2014-08-15 DIAGNOSIS — Z23 Encounter for immunization: Secondary | ICD-10-CM

## 2014-08-15 NOTE — Assessment & Plan Note (Signed)
No recent chest pain is follow-up with cardiology next week he will come in and get fasting labs before then

## 2014-08-15 NOTE — Assessment & Plan Note (Signed)
Currently stable continue Spiriva has not used albuterol flu shot and Prevnar 13 given

## 2014-08-15 NOTE — Patient Instructions (Addendum)
Flu and prevnar13 given Return for bloodwork  Continue current medications F/U 6 months

## 2014-08-15 NOTE — Progress Notes (Signed)
Patient ID: Zachary Chambers, male   DOB: March 27, 1951, 63 y.o.   MRN: [id number]   Subjective:    Patient ID: Zachary Chambers, male    DOB: 1951/07/28, 63 y.o.   MRN: [id number]  Patient presents for F/U  to follow chronic medical problems. He has no specific concerns today. His history of coronary artery disease as well as COPD both have been stable recently he's taken his medications as prescribed. Meds reviewed Due for flu shot and prevnar 13    Review Of Systems:  GEN- denies fatigue, fever, weight loss,weakness, recent illness HEENT- denies eye drainage, change in vision, nasal discharge, CVS- denies chest pain, palpitations RESP- denies SOB, cough, wheeze ABD- denies N/V, change in stools, abd pain GU- denies dysuria, hematuria, dribbling, incontinence MSK- denies joint pain, muscle aches, injury Neuro- denies headache, dizziness, syncope, seizure activity       Objective:    BP 132/78 mmHg  Pulse 78  Temp(Src) 98 F (36.7 C) (Oral)  Resp 16  Ht 5\' 7"  (1.702 m)  Wt 148 lb (67.132 kg)  BMI 23.17 kg/m2 GEN- NAD, alert and oriented x3 HEENT- PERRL, EOMI, non injected sclera, pink conjunctiva, MMM, oropharynx clear CVS- RRR, no murmur RESP-CTAB EXT- No edema Pulses- Radial 2+        Assessment & Plan:      Problem List Items Addressed This Visit      Unprioritized   TOBACCO ABUSE   GERD (gastroesophageal reflux disease)   COPD (chronic obstructive pulmonary disease) - Primary   CAD (coronary artery disease)    Other Visit Diagnoses    Need for prophylactic vaccination and inoculation against influenza        Relevant Orders       Flu Vaccine QUAD 36+ mos PF IM (Fluarix Quad PF)    Need for prophylactic vaccination against Streptococcus pneumoniae (pneumococcus)        Relevant Orders       Pneumococcal conjugate vaccine 13-valent       Note: This dictation was prepared with Dragon dictation along with smaller phrase technology. Any transcriptional errors that  result from this process are unintentional.

## 2014-08-20 ENCOUNTER — Other Ambulatory Visit: Payer: Medicare Other

## 2014-08-20 DIAGNOSIS — I25110 Atherosclerotic heart disease of native coronary artery with unstable angina pectoris: Secondary | ICD-10-CM | POA: Diagnosis not present

## 2014-08-21 ENCOUNTER — Encounter: Payer: Self-pay | Admitting: *Deleted

## 2014-08-21 LAB — CBC WITH DIFFERENTIAL/PLATELET
Basophils Absolute: 0 10*3/uL (ref 0.0–0.1)
Basophils Relative: 1 % (ref 0–1)
Eosinophils Absolute: 0 10*3/uL (ref 0.0–0.7)
Eosinophils Relative: 1 % (ref 0–5)
HCT: 37.4 % — ABNORMAL LOW (ref 39.0–52.0)
Hemoglobin: 12.7 g/dL — ABNORMAL LOW (ref 13.0–17.0)
Lymphocytes Relative: 32 % (ref 12–46)
Lymphs Abs: 1.2 10*3/uL (ref 0.7–4.0)
MCH: 32.6 pg (ref 26.0–34.0)
MCHC: 34 g/dL (ref 30.0–36.0)
MCV: 96.1 fL (ref 78.0–100.0)
MPV: 8.9 fL (ref 8.6–12.4)
Monocytes Absolute: 0.4 10*3/uL (ref 0.1–1.0)
Monocytes Relative: 11 % (ref 3–12)
Neutro Abs: 2 10*3/uL (ref 1.7–7.7)
Neutrophils Relative %: 55 % (ref 43–77)
Platelets: 434 10*3/uL — ABNORMAL HIGH (ref 150–400)
RBC: 3.89 MIL/uL — ABNORMAL LOW (ref 4.22–5.81)
RDW: 13.2 % (ref 11.5–15.5)
WBC: 3.7 10*3/uL — ABNORMAL LOW (ref 4.0–10.5)

## 2014-08-21 LAB — COMPREHENSIVE METABOLIC PANEL
ALT: <8 U/L (ref 0–53)
AST: 21 U/L (ref 0–37)
Albumin: 3.4 g/dL — ABNORMAL LOW (ref 3.5–5.2)
Alkaline Phosphatase: 96 U/L (ref 39–117)
BUN: 3 mg/dL — ABNORMAL LOW (ref 6–23)
CO2: 26 mEq/L (ref 19–32)
Calcium: 8.7 mg/dL (ref 8.4–10.5)
Chloride: 97 mEq/L (ref 96–112)
Creat: 0.6 mg/dL (ref 0.50–1.35)
Glucose, Bld: 96 mg/dL (ref 70–99)
Potassium: 4.8 mEq/L (ref 3.5–5.3)
Sodium: 134 mEq/L — ABNORMAL LOW (ref 135–145)
Total Bilirubin: 0.3 mg/dL (ref 0.2–1.2)
Total Protein: 7 g/dL (ref 6.0–8.3)

## 2014-08-21 LAB — LIPID PANEL
Cholesterol: 140 mg/dL (ref 0–200)
HDL: 67 mg/dL (ref >39)
LDL Cholesterol: 63 mg/dL (ref 0–99)
Total CHOL/HDL Ratio: 2.1 Ratio
Triglycerides: 51 mg/dL (ref <150)
VLDL: 10 mg/dL (ref 0–40)

## 2014-08-22 ENCOUNTER — Ambulatory Visit (HOSPITAL_COMMUNITY): Payer: Commercial Managed Care - HMO | Attending: Cardiovascular Disease | Admitting: *Deleted

## 2014-08-22 DIAGNOSIS — I6523 Occlusion and stenosis of bilateral carotid arteries: Secondary | ICD-10-CM | POA: Diagnosis not present

## 2014-08-22 DIAGNOSIS — R0989 Other specified symptoms and signs involving the circulatory and respiratory systems: Secondary | ICD-10-CM

## 2014-08-22 NOTE — Progress Notes (Signed)
Carotid Duplex Exam Performed 

## 2014-10-29 ENCOUNTER — Ambulatory Visit: Payer: Commercial Managed Care - HMO | Admitting: Emergency Medicine

## 2014-11-07 DIAGNOSIS — H5210 Myopia, unspecified eye: Secondary | ICD-10-CM | POA: Diagnosis not present

## 2014-11-07 DIAGNOSIS — Z0100 Encounter for examination of eyes and vision without abnormal findings: Secondary | ICD-10-CM | POA: Diagnosis not present

## 2014-11-07 DIAGNOSIS — H5213 Myopia, bilateral: Secondary | ICD-10-CM | POA: Diagnosis not present

## 2014-12-11 ENCOUNTER — Encounter: Payer: Self-pay | Admitting: Emergency Medicine

## 2014-12-11 ENCOUNTER — Ambulatory Visit (INDEPENDENT_AMBULATORY_CARE_PROVIDER_SITE_OTHER): Payer: Commercial Managed Care - HMO | Admitting: Emergency Medicine

## 2014-12-11 ENCOUNTER — Other Ambulatory Visit: Payer: Self-pay | Admitting: Family Medicine

## 2014-12-11 VITALS — BP 112/60 | HR 88 | Ht 68.0 in | Wt 140.0 lb

## 2014-12-11 DIAGNOSIS — F17200 Nicotine dependence, unspecified, uncomplicated: Secondary | ICD-10-CM

## 2014-12-11 DIAGNOSIS — J449 Chronic obstructive pulmonary disease, unspecified: Secondary | ICD-10-CM | POA: Diagnosis not present

## 2014-12-11 DIAGNOSIS — Z720 Tobacco use: Secondary | ICD-10-CM

## 2014-12-11 NOTE — Progress Notes (Signed)
Subjective:    Patient ID: Zachary Chambers, male    DOB: 11-24-1950, 64 y.o.   MRN: [id number]  HPI 64 yo man, smoker (59 pk-yrs), hx of CAD + PTCI, GERD.  Followed by Drs Buelah Manis and [place]. 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 He still smokes 1.0- 1.5 pk a day. He has occasional cough, not every day. Occasionally wheezes, especially at night when laying supine. Has a SABA, has never used it.   ROV 10/31/13 -- follow up for COPD. Last time we started spiriva, discussed smoking cessation. He feels that the spiriva has helped > less rattling, less wheeze, able to do a bit more. He is coughing, may be a bit more since he started the spiriva. He has never used his SABA. Still smoking same amt 1-1.5pk/day  ROV 04/06/14 -- follow up visit for tobacco use and COPD. He has benefited from Spiriva.. He continues to smoke - rolls his own. He has cut down. He isn't ready to set a quit. He still gardens, still active. Discussed LDCT screening today.   ROV 12/11/14 -- follow-up visit for COPD. FEV1 1.39L (43% pred).  He is able to exert, work in the garden, shop. He does have some periods of "panic" that can cause some SOB. He is on Spiriva qd. Has not needed albuterol. He is having cough and nasal congestion. He is smoking about the same amount. He has thought about the LDCT screening - doesn't want to do it right now.  He has had the Pneumovax.    CAT Score 04/06/2014  Total CAT Score 12     Review of Systems  Constitutional: Negative for fever and unexpected weight change.  HENT: Negative for congestion, dental problem, ear pain, nosebleeds, postnasal drip, rhinorrhea, sinus pressure, sneezing, sore throat and trouble swallowing.   Eyes: Negative for redness and itching.  Respiratory: Positive for cough, shortness of breath and wheezing. Negative for chest tightness.   Cardiovascular: Negative for palpitations  and leg swelling.  Gastrointestinal: Negative for nausea and vomiting.  Genitourinary: Negative for dysuria.  Musculoskeletal: Negative for joint swelling.  Skin: Negative for rash.  Neurological: Negative for headaches.  Hematological: Bruises/bleeds easily.  Psychiatric/Behavioral: Negative for dysphoric mood. The patient is not nervous/anxious.     No Known Allergies   Outpatient Prescriptions Prior to Visit  Medication Sig Dispense Refill  . albuterol (PROVENTIL HFA;VENTOLIN HFA) 108 (90 BASE) MCG/ACT inhaler Inhale 2 puffs into the lungs every 6 (six) hours as needed for wheezing or shortness of breath. 1 Inhaler 2  . aspirin 81 MG tablet Take 81 mg by mouth daily.      . cyclobenzaprine (FLEXERIL) 5 MG tablet Take 1 tablet (5 mg total) by mouth 3 (three) times daily as needed for muscle spasms. 20 tablet 1  . Glucosamine HCl (GLUCOSAMINE PO) Take 1,500 mg by mouth daily.    . nitroGLYCERIN (NITROSTAT) 0.4 MG SL tablet Place 0.4 mg under the tongue every 5 (five) minutes as needed for chest pain.    Marland Kitchen omeprazole (PRILOSEC) 20 MG capsule Take 40 mg by mouth daily.    . simvastatin (ZOCOR) 40 MG tablet Take 0.5 tablets (20 mg total) by mouth at bedtime. TAKE ONE TABLET BY MOUTH EVERY DAY IN THE EVENING 30 tablet 3  . tiotropium (SPIRIVA) 18 MCG inhalation capsule Place 1 capsule (18 mcg total) into inhaler and inhale  daily. 90 capsule 3  . traMADol (ULTRAM) 50 MG tablet Take 1 tablet (50 mg total) by mouth every 8 (eight) hours as needed for pain. 30 tablet 1   No facility-administered medications prior to visit.         Objective:   Physical Exam Filed Vitals:   12/11/14 1340  BP: 112/60  Pulse: 88  Height: 5\' 8"  (1.727 m)  Weight: 140 lb (63.504 kg)  SpO2: 93%   Gen: Pleasant, elderly man, in no distress,  normal affect  ENT: No lesions,  mouth clear,  oropharynx clear, no postnasal drip  Neck: No JVD, no TMG, no carotid bruits  Lungs: No use of accessory muscles,  distant, clear without rales or rhonchi  Cardiovascular: RRR, heart sounds normal, no murmur or gallops, no peripheral edema  Musculoskeletal: No deformities, no cyanosis or clubbing  Neuro: alert, non focal  Skin: Warm, no lesions or rashes      Assessment & Plan:  COPD (chronic obstructive pulmonary disease) Appears to be stable on his current regimen of Spiriva. He continues to smoke and we discussed this in detail. I gave him advice and some counseling about possible ways to work on stopping. Also discussed low-dose CT scan screening for lung cancer. I explained the pros and cons and he would like to defer at this time.    TOBACCO ABUSE Discussed cessation and possible techniques

## 2014-12-11 NOTE — Patient Instructions (Signed)
Please continue your Spiriva every day Try to work hard on decreasing your cigarettes. We will revisit this next time We discussed low-dose CT scan screening for lung cancer. We will talk about this more next time Follow with Dr Lamonte Sakai in 6 months or sooner if you have any problems

## 2014-12-11 NOTE — Telephone Encounter (Signed)
Medication refilled per protocol. 

## 2014-12-11 NOTE — Assessment & Plan Note (Signed)
Discussed cessation and possible techniques

## 2014-12-11 NOTE — Assessment & Plan Note (Signed)
Appears to be stable on his current regimen of Spiriva. He continues to smoke and we discussed this in detail. I gave him advice and some counseling about possible ways to work on stopping. Also discussed low-dose CT scan screening for lung cancer. I explained the pros and cons and he would like to defer at this time.

## 2014-12-12 ENCOUNTER — Telehealth: Payer: Self-pay | Admitting: *Deleted

## 2014-12-12 NOTE — Telephone Encounter (Signed)
Submitted [employer] referral thru [facility] for authorization on 12/10/14 for authorization to Dr. Baltazar Apo, MD with authorization [id number]  Requesting [place] Washita,MD  Treating provider: Tarri Glenn  Number of visits:6  Start Date: 12/11/14  End Date:06/09/15  Dx:J44.1-Chronic obstructive pulmonary disease w (acute) exacerbation  Copy has been faxed to Pulmonary office for review/records

## 2015-02-13 ENCOUNTER — Ambulatory Visit (INDEPENDENT_AMBULATORY_CARE_PROVIDER_SITE_OTHER): Payer: Commercial Managed Care - HMO | Admitting: Family Medicine

## 2015-02-13 ENCOUNTER — Encounter: Payer: Self-pay | Admitting: Family Medicine

## 2015-02-13 VITALS — BP 124/64 | HR 68 | Temp 98.5°F | Resp 14 | Ht 68.0 in | Wt 142.0 lb

## 2015-02-13 DIAGNOSIS — Z125 Encounter for screening for malignant neoplasm of prostate: Secondary | ICD-10-CM

## 2015-02-13 DIAGNOSIS — I25110 Atherosclerotic heart disease of native coronary artery with unstable angina pectoris: Secondary | ICD-10-CM | POA: Diagnosis not present

## 2015-02-13 DIAGNOSIS — F17200 Nicotine dependence, unspecified, uncomplicated: Secondary | ICD-10-CM

## 2015-02-13 DIAGNOSIS — J449 Chronic obstructive pulmonary disease, unspecified: Secondary | ICD-10-CM | POA: Diagnosis not present

## 2015-02-13 DIAGNOSIS — Z0000 Encounter for general adult medical examination without abnormal findings: Secondary | ICD-10-CM | POA: Diagnosis not present

## 2015-02-13 DIAGNOSIS — J418 Mixed simple and mucopurulent chronic bronchitis: Secondary | ICD-10-CM

## 2015-02-13 DIAGNOSIS — I2510 Atherosclerotic heart disease of native coronary artery without angina pectoris: Secondary | ICD-10-CM | POA: Diagnosis not present

## 2015-02-13 DIAGNOSIS — Z720 Tobacco use: Secondary | ICD-10-CM

## 2015-02-13 MED ORDER — TRAMADOL HCL 50 MG PO TABS: 50.0000 mg | ORAL_TABLET | Freq: Three times a day (TID) | ORAL | Status: DC | PRN

## 2015-02-13 MED ORDER — SIMVASTATIN 40 MG PO TABS: 40.0000 mg | ORAL_TABLET | Freq: Every day | ORAL | Status: DC

## 2015-02-13 MED ORDER — TIOTROPIUM BROMIDE MONOHYDRATE 18 MCG IN CAPS: 18.0000 ug | ORAL_CAPSULE | Freq: Every day | RESPIRATORY_TRACT | Status: DC

## 2015-02-13 MED ORDER — CYCLOBENZAPRINE HCL 5 MG PO TABS: 5.0000 mg | ORAL_TABLET | Freq: Three times a day (TID) | ORAL | Status: DC | PRN

## 2015-02-13 NOTE — Progress Notes (Signed)
Patient ID: Zachary Chambers, male   DOB: 09-Nov-1950, 64 y.o.   MRN: [id number] Subjective:   Patient presents for Medicare Annual/Subsequent preventive examination.   Pt here for CPE, no concerns today, feeling well. Seen by pulmonary maintained on Spiriva, has appt with cardiology next month Medications reviewed Due for fasting labs  Review Past Medical/Family/Social:Per EMR   Risk Factors  Current exercise habits: walks Dietary issues discussed: yes  Cardiac risk factors: CAD  Depression Screen - see PHQ 9  (Note: if answer to either of the following is "Yes", a more complete depression screening is indicated)  Over the past two weeks, have you felt down, depressed or hopeless? no Over the past two weeks, have you felt little interest or pleasure in doing things? yes Have you lost interest or pleasure in daily life? No Do you often feel hopeless? No Do you cry easily over simple problems? No   Activities of Daily Living  In your present state of health, do you have any difficulty performing the following activities?:  Driving? No  Managing money? No  Feeding yourself? No  Getting from bed to chair? No  Climbing a flight of stairs? No  Preparing food and eating?: No  Bathing or showering? No  Getting dressed: No  Getting to the toilet? No  Using the toilet:No  Moving around from place to place: No  In the past year have you fallen or had a near fall?:No  Are you sexually active? No  Do you have more than one partner? No   Hearing Difficulties: No  Do you often ask people to speak up or repeat themselves? No  Do you experience ringing or noises in your ears? No Do you have difficulty understanding soft or whispered voices? No  Do you feel that you have a problem with memory? No Do you often misplace items? No  Do you feel safe at home? Yes  Cognitive Testing  Alert? Yes Normal Appearance?Yes  Oriented to person? Yes Place? Yes  Time? Yes  Recall of three objects? Yes   Can perform simple calculations? Yes  Displays appropriate judgment?Yes  Can read the correct time from a watch face?Yes   List the Names of Other Physician/Practitioners you currently use: Cardiology, pulmonary     Screening Tests / Date Colonoscopy   - UTD                  Zostavax - sent to pharmacy Influenza Vaccine -UTD Tetanus/tdap- sent to pharmacy  GEN- denies fatigue, fever, weight loss,weakness, recent illness HEENT- denies eye drainage, change in vision, nasal discharge, CVS- denies chest pain, palpitations RESP- denies SOB, cough, wheeze ABD- denies N/V, change in stools, abd pain GU- denies dysuria, hematuria, dribbling, incontinence MSK- denies joint pain, muscle aches, injury Neuro- denies headache, dizziness, syncope, seizure activity  Physical: GEN- NAD, alert and oriented x3 HEENT- PERRL, EOMI, non injected sclera, pink conjunctiva, MMM, oropharynx clear Neck- Supple, no thryomegaly CVS- RRR, no murmur RESP-CTAB GU- RECTUM- normal tone, external tags, enlarged prostate, no nodules, FOBT neg EXT- No edema Pulses- Radial, DP- 2+     Assessment:    Annual wellness medicare exam   Plan:    During the course of the visit the patient was educated and counseled about appropriate screening and preventive services including:   Shingles vaccine. Prescription given to that she can get the vaccine at the pharmacy or Medicare part D.  Screen neg for depression on further discussion will just  monitorDiet review for nutrition referral? Yes ____ Not Indicated __x__  Patient Instructions (the written plan) was given to the patient.  Medicare Attestation  I have personally reviewed:  The patient's medical and social history  Their use of alcohol, tobacco or illicit drugs  Their current medications and supplements  The patient's functional ability including ADLs,fall risks, home safety risks, cognitive, and hearing and visual impairment  Diet and physical  activities  Evidence for depression or mood disorders  The patient's weight, height, BMI, and visual acuity have been recorded in the chart. I have made referrals, counseling, and provided education to the patient based on review of the above and I have provided the patient with a written personalized care plan for preventive services.

## 2015-02-13 NOTE — Assessment & Plan Note (Signed)
Currently stable, no change to meds 

## 2015-02-13 NOTE — Patient Instructions (Signed)
Shingles vaccine and Tetanus booster sent to pharmacy We will call with lab results Continue current medications FU November

## 2015-02-14 ENCOUNTER — Other Ambulatory Visit: Payer: Self-pay | Admitting: *Deleted

## 2015-02-14 LAB — COMPREHENSIVE METABOLIC PANEL
ALT: <8 U/L (ref 0–53)
AST: 25 U/L (ref 0–37)
Albumin: 3.8 g/dL (ref 3.5–5.2)
Alkaline Phosphatase: 56 U/L (ref 39–117)
BUN: 3 mg/dL — ABNORMAL LOW (ref 6–23)
CO2: 28 mEq/L (ref 19–32)
Calcium: 9 mg/dL (ref 8.4–10.5)
Chloride: 98 mEq/L (ref 96–112)
Creat: 0.78 mg/dL (ref 0.50–1.35)
Glucose, Bld: 76 mg/dL (ref 70–99)
Potassium: 4.7 mEq/L (ref 3.5–5.3)
Sodium: 135 mEq/L (ref 135–145)
Total Bilirubin: 0.5 mg/dL (ref 0.2–1.2)
Total Protein: 6.8 g/dL (ref 6.0–8.3)

## 2015-02-14 LAB — CBC WITH DIFFERENTIAL/PLATELET
Basophils Absolute: 0 10*3/uL (ref 0.0–0.1)
Basophils Relative: 1 % (ref 0–1)
Eosinophils Absolute: 0 10*3/uL (ref 0.0–0.7)
Eosinophils Relative: 1 % (ref 0–5)
HCT: 40.5 % (ref 39.0–52.0)
Hemoglobin: 14.3 g/dL (ref 13.0–17.0)
Lymphocytes Relative: 30 % (ref 12–46)
Lymphs Abs: 1.1 10*3/uL (ref 0.7–4.0)
MCH: 33.3 pg (ref 26.0–34.0)
MCHC: 35.3 g/dL (ref 30.0–36.0)
MCV: 94.2 fL (ref 78.0–100.0)
MPV: 8.8 fL (ref 8.6–12.4)
Monocytes Absolute: 0.3 10*3/uL (ref 0.1–1.0)
Monocytes Relative: 8 % (ref 3–12)
Neutro Abs: 2.2 10*3/uL (ref 1.7–7.7)
Neutrophils Relative %: 60 % (ref 43–77)
Platelets: 313 10*3/uL (ref 150–400)
RBC: 4.3 MIL/uL (ref 4.22–5.81)
RDW: 13.1 % (ref 11.5–15.5)
WBC: 3.7 10*3/uL — ABNORMAL LOW (ref 4.0–10.5)

## 2015-02-14 LAB — LIPID PANEL
Cholesterol: 133 mg/dL (ref 0–200)
HDL: 96 mg/dL (ref >=40)
LDL Cholesterol: 30 mg/dL (ref 0–99)
Total CHOL/HDL Ratio: 1.4 Ratio
Triglycerides: 34 mg/dL (ref <150)
VLDL: 7 mg/dL (ref 0–40)

## 2015-02-14 LAB — PSA, MEDICARE: PSA: 0.67 ng/mL (ref <=4.00)

## 2015-02-14 MED ORDER — TETANUS-DIPHTH-ACELL PERTUSSIS 5-2.5-18.5 LF-MCG/0.5 IM SUSP: 0.5000 mL | Freq: Once | INTRAMUSCULAR | Status: DC

## 2015-02-14 MED ORDER — ZOSTER VACCINE LIVE 19400 UNT/0.65ML ~~LOC~~ SOLR: 0.6500 mL | Freq: Once | SUBCUTANEOUS | Status: DC

## 2015-02-15 ENCOUNTER — Encounter: Payer: Self-pay | Admitting: *Deleted

## 2015-02-27 ENCOUNTER — Telehealth: Payer: Self-pay | Admitting: *Deleted

## 2015-02-27 NOTE — Telephone Encounter (Signed)
Submitted [employer] referral thru acuity connect for authorization to Dr. Verlin Dike with authorization [id number]  Requesting provider: Neysa Hotter  Treating provider: Verlin Dike  Number of visits:6  Start Date: 03/04/15  End Date:08/31/15  Dx: I25.10-Athscl heart disease of native coronary artery w/o ang pctrs       J44.9-Chronic obstructive pulmonary disease,unspecified  Copy has been faxed to [facility] for records/review

## 2015-03-01 ENCOUNTER — Telehealth: Payer: Self-pay | Admitting: Family Medicine

## 2015-03-01 MED ORDER — TETANUS-DIPHTH-ACELL PERTUSSIS 5-2.5-18.5 LF-MCG/0.5 IM SUSP: 0.5000 mL | Freq: Once | INTRAMUSCULAR | Status: DC

## 2015-03-01 MED ORDER — ZOSTER VACCINE LIVE 19400 UNT/0.65ML ~~LOC~~ SOLR: 0.6500 mL | Freq: Once | SUBCUTANEOUS | Status: DC

## 2015-03-01 NOTE — Progress Notes (Signed)
Patient ID: Zachary Chambers, male   DOB: 10-Aug-1951, 64 y.o.   MRN: [id number] 64 y.o.  previously seen by Dr Doreatha Lew. New to me 5/12 . He has had infrequent medical F/U due to lack of insurance. Marland Kitchen CAD with stent in circ and latest cath 2003 with patent circ stent and new stent placed to LAD. Previous angina a cramping pain in chest. Has not had any of this. Smokes 1-2 ppd. CXR 01/14/11 with hyperinflation and no other abnormalities . Mild exertional dyspnea. No cough, sputum or fever. Has medicare now. Not taking lipitor Only taking asa and an over the counter acid pill. Counselled for less than 10 minutes on smoking cessation. Not motivated to quit. Active doing yard work. No palpitations, PND orthopnea. EF normal in past. Has n ano primary care doctor Lipids and liver not been checked in 2 years. Not had CXR since 02/13/13 reviewed and NAD Last labs  Lab Results  Component Value Date   [place] 30 02/13/2015     Says he is seeing Dr Phillip Heal as primary    Occasional wheezing   Seen by [facility] for COPD in March some improvement with Spiriva  Carotids 08/23/14 reviewed with midl plaque no significant stenosis    ROS: Denies fever, malais, weight loss, blurry vision, decreased visual acuity, cough, sputum, SOB, hemoptysis, pleuritic pain, palpitaitons, heartburn, abdominal pain, melena, lower extremity edema, claudication, or rash.  All other systems reviewed and negative  General: Affect appropriate Chronically ill male  HEENT: normal Neck supple with no adenopathy JVP normal right  bruits no thyromegaly Lungs diffuse rhonchi and wheezing and good diaphragmatic motion Heart:  S1/S2 no murmur, no rub, gallop or click PMI normal Abdomen: benighn, BS positve, no tenderness,  Palpable AAA no bruit.  No HSM or HJR Distal pulses intact with no bruits No edema Neuro non-focal Skin warm and dry No muscular weakness   Current Outpatient Prescriptions  Medication Sig Dispense Refill  . albuterol  (PROVENTIL HFA;VENTOLIN HFA) 108 (90 BASE) MCG/ACT inhaler Inhale 2 puffs into the lungs every 6 (six) hours as needed for wheezing or shortness of breath. 1 Inhaler 2  . aspirin 81 MG tablet Take 81 mg by mouth daily.      . cyclobenzaprine (FLEXERIL) 5 MG tablet Take 1 tablet (5 mg total) by mouth 3 (three) times daily as needed for muscle spasms. 20 tablet 1  . Glucosamine HCl (GLUCOSAMINE PO) Take 1,500 mg by mouth daily.    . nitroGLYCERIN (NITROSTAT) 0.4 MG SL tablet Place 0.4 mg under the tongue every 5 (five) minutes as needed for chest pain.    Marland Kitchen omeprazole (PRILOSEC) 20 MG capsule Take 40 mg by mouth daily.    . simvastatin (ZOCOR) 40 MG tablet Take 1 tablet (40 mg total) by mouth at bedtime. 30 tablet 2  . tiotropium (SPIRIVA) 18 MCG inhalation capsule Place 1 capsule (18 mcg total) into inhaler and inhale daily. 90 capsule 3  . traMADol (ULTRAM) 50 MG tablet Take 50 mg by mouth every 6 (six) hours as needed for moderate pain.     No current facility-administered medications for this visit.    Allergies  Review of patient's allergies indicates no known allergies.  Electrocardiogram:  SR rate 87 normal ECG   Assessment and Plan CAD: Stable with no angina and good activity level.  Continue medical Rx  Smoking: COPD needs f/u CXR  Sees [place] in pulmonary  Bruit:  Plaque no stenosis f/u duplex in a year  AAA:  Palpable AAA f/u duplex to size  Chol:  Cholesterol is at goal.  Continue current dose of statin and diet Rx.  No myalgias or side effects.  F/U  LFT's in 6 months. Lab Results  Component Value Date   LDLCALC 30 02/13/2015            F/U with me in 6 months CXR and Abdominal US ordered

## 2015-03-01 NOTE — Telephone Encounter (Signed)
Patient is calling to get refills sent to [facility] for his tdap and shingles he did not go get it quick enough and they put it back on the shelf  [phone]

## 2015-03-01 NOTE — Telephone Encounter (Signed)
Resent to pharm

## 2015-03-04 ENCOUNTER — Encounter: Payer: Self-pay | Admitting: Cardiovascular Disease

## 2015-03-04 ENCOUNTER — Ambulatory Visit (INDEPENDENT_AMBULATORY_CARE_PROVIDER_SITE_OTHER): Payer: Commercial Managed Care - HMO | Admitting: Cardiovascular Disease

## 2015-03-04 ENCOUNTER — Ambulatory Visit (INDEPENDENT_AMBULATORY_CARE_PROVIDER_SITE_OTHER)
Admission: RE | Admit: 2015-03-04 | Discharge: 2015-03-04 | Disposition: A | Discharge status: Home / Self Care | Payer: Commercial Managed Care - HMO | Source: Ambulatory Visit | Attending: Cardiovascular Disease | Admitting: Cardiovascular Disease

## 2015-03-04 VITALS — BP 102/58 | HR 88 | Ht 68.0 in | Wt 145.0 lb

## 2015-03-04 DIAGNOSIS — I2510 Atherosclerotic heart disease of native coronary artery without angina pectoris: Secondary | ICD-10-CM | POA: Diagnosis not present

## 2015-03-04 DIAGNOSIS — J449 Chronic obstructive pulmonary disease, unspecified: Secondary | ICD-10-CM | POA: Diagnosis not present

## 2015-03-04 DIAGNOSIS — R0989 Other specified symptoms and signs involving the circulatory and respiratory systems: Secondary | ICD-10-CM

## 2015-03-04 NOTE — Patient Instructions (Signed)
Medication Instructions:  NO  CHANGES  Labwork: NONE  Testing/Procedures: A chest x-ray takes a picture of the organs and structures inside the chest, including the heart, lungs, and blood vessels. This test can show several things, including, whether the heart is enlarges; whether fluid is building up in the lungs; and whether pacemaker / defibrillator leads are still in place.  Your physician has requested that you have an abdominal aorta duplex. During this test, an ultrasound is used to evaluate the aorta. Allow 30 minutes for this exam. Do not eat after midnight the day before and avoid carbonated beverages   Follow-Up: Your physician wants you to follow-up in: [place] will receive a reminder letter in the mail two months in advance. If you don't receive a letter, please call our office to schedule the follow-up appointment.  Any Other Special Instructions Will Be Listed Below (If Applicable).

## 2015-03-05 ENCOUNTER — Telehealth: Payer: Self-pay

## 2015-03-05 NOTE — Telephone Encounter (Signed)
Lmom for pt to call me back. We had to change his appt till 07/27 @ 9 due to not doing AAA Duplex after 11 am, stpegram

## 2015-03-06 ENCOUNTER — Other Ambulatory Visit (HOSPITAL_COMMUNITY): Payer: Commercial Managed Care - HMO

## 2015-03-13 ENCOUNTER — Ambulatory Visit (HOSPITAL_COMMUNITY): Payer: Commercial Managed Care - HMO | Attending: Cardiology

## 2015-03-13 ENCOUNTER — Other Ambulatory Visit (HOSPITAL_COMMUNITY): Payer: Commercial Managed Care - HMO

## 2015-03-13 DIAGNOSIS — I700 Atherosclerosis of aorta: Secondary | ICD-10-CM | POA: Insufficient documentation

## 2015-03-13 DIAGNOSIS — I2510 Atherosclerotic heart disease of native coronary artery without angina pectoris: Secondary | ICD-10-CM | POA: Diagnosis not present

## 2015-03-13 DIAGNOSIS — R0989 Other specified symptoms and signs involving the circulatory and respiratory systems: Secondary | ICD-10-CM | POA: Diagnosis not present

## 2015-07-17 ENCOUNTER — Ambulatory Visit (INDEPENDENT_AMBULATORY_CARE_PROVIDER_SITE_OTHER): Payer: Commercial Managed Care - HMO | Admitting: Family Medicine

## 2015-07-17 ENCOUNTER — Encounter: Payer: Self-pay | Admitting: Family Medicine

## 2015-07-17 VITALS — BP 120/72 | HR 78 | Temp 98.2°F | Resp 14 | Ht 68.0 in | Wt 145.0 lb

## 2015-07-17 DIAGNOSIS — J301 Allergic rhinitis due to pollen: Secondary | ICD-10-CM

## 2015-07-17 DIAGNOSIS — I2510 Atherosclerotic heart disease of native coronary artery without angina pectoris: Secondary | ICD-10-CM | POA: Diagnosis not present

## 2015-07-17 DIAGNOSIS — Z23 Encounter for immunization: Secondary | ICD-10-CM | POA: Diagnosis not present

## 2015-07-17 DIAGNOSIS — K219 Gastro-esophageal reflux disease without esophagitis: Secondary | ICD-10-CM | POA: Diagnosis not present

## 2015-07-17 DIAGNOSIS — F17200 Nicotine dependence, unspecified, uncomplicated: Secondary | ICD-10-CM

## 2015-07-17 DIAGNOSIS — J449 Chronic obstructive pulmonary disease, unspecified: Secondary | ICD-10-CM | POA: Diagnosis not present

## 2015-07-17 DIAGNOSIS — I25110 Atherosclerotic heart disease of native coronary artery with unstable angina pectoris: Secondary | ICD-10-CM | POA: Diagnosis not present

## 2015-07-17 DIAGNOSIS — J418 Mixed simple and mucopurulent chronic bronchitis: Secondary | ICD-10-CM | POA: Diagnosis not present

## 2015-07-17 MED ORDER — ZOSTER VACCINE LIVE 19400 UNT/0.65ML ~~LOC~~ SOLR
0.6500 mL | Freq: Once | SUBCUTANEOUS | Status: DC
Start: 2015-07-17 — End: 2015-10-25

## 2015-07-17 MED ORDER — TIOTROPIUM BROMIDE MONOHYDRATE 18 MCG IN CAPS: 18.0000 ug | ORAL_CAPSULE | Freq: Every day | RESPIRATORY_TRACT | Status: DC

## 2015-07-17 MED ORDER — ALBUTEROL SULFATE HFA 108 (90 BASE) MCG/ACT IN AERS: 2.0000 | INHALATION_SPRAY | Freq: Four times a day (QID) | RESPIRATORY_TRACT | Status: DC | PRN

## 2015-07-17 MED ORDER — SIMVASTATIN 40 MG PO TABS: 40.0000 mg | ORAL_TABLET | Freq: Every day | ORAL | Status: DC

## 2015-07-17 MED ORDER — TRAMADOL HCL 50 MG PO TABS: 50.0000 mg | ORAL_TABLET | Freq: Four times a day (QID) | ORAL | Status: DC | PRN

## 2015-07-17 MED ORDER — FLUTICASONE PROPIONATE 50 MCG/ACT NA SUSP: 2.0000 | Freq: Every day | NASAL | Status: DC

## 2015-07-17 MED ORDER — TETANUS-DIPHTH-ACELL PERTUSSIS 5-2-15.5 LF-MCG/0.5 IM SUSP: 0.5000 mL | Freq: Once | INTRAMUSCULAR | Status: DC

## 2015-07-17 MED ORDER — CYCLOBENZAPRINE HCL 5 MG PO TABS: 5.0000 mg | ORAL_TABLET | Freq: Three times a day (TID) | ORAL | Status: DC | PRN

## 2015-07-17 MED ORDER — OMEPRAZOLE 40 MG PO CPDR: 40.0000 mg | DELAYED_RELEASE_CAPSULE | Freq: Every day | ORAL | Status: DC

## 2015-07-17 MED ORDER — NITROGLYCERIN 0.4 MG SL SUBL: 0.4000 mg | SUBLINGUAL_TABLET | SUBLINGUAL | Status: DC | PRN

## 2015-07-17 NOTE — Progress Notes (Signed)
Patient ID: KEDDRICK SINNER, male   DOB: 05-Apr-1951, 64 y.o.   MRN: [id number]   Subjective:    Patient ID: Janeann Merl, male    DOB: [birth year], 64 y.o.   MRN: [id number]  Patient presents for 6 month F/U  Pt here for F/U follow-up chronic medical problems. His history of coronary artery disease as well as COPD. He's not had to use any nitroglycerin he has not had any chest pain. He does get short of breath if he is exerting himself with his activities but the Spiriva works very well for him. He's only had to use albuterol once or twice. He continues to smoke and is not ready to quit.  He has history of acid reflux he's been using over-the-counter acid reducer would like to go back on his prescription Prilosec.  The past couple weeks he's noticed some sinus drainage and postnasal drip from the change in weather. He has not had any pressure or headaches no fever. Drainage is clear.    Review Of Systems:  GEN- denies fatigue, fever, weight loss,weakness, recent illness HEENT- denies eye drainage, change in vision, +nasal discharge, CVS- denies chest pain, palpitations RESP- denies SOB, cough, wheeze ABD- denies N/V, change in stools, abd pain GU- denies dysuria, hematuria, dribbling, incontinence MSK- denies joint pain, muscle aches, injury Neuro- denies headache, dizziness, syncope, seizure activity       Objective:    BP 120/72 mmHg  Pulse 78  Temp(Src) 98.2 F (36.8 C) (Oral)  Resp 14  Ht 5\' 8"  (1.727 m)  Wt 145 lb (65.772 kg)  BMI 22.05 kg/m2 GEN- NAD, alert and oriented x3 HEENT- PERRL, EOMI, non injected sclera, pink conjunctiva, MMM, oropharynx clear, no maxillary sinus tenderness, clear rhinorrhea, enlarged turbinates  Neck- Supple, no LAD CVS- RRR, no murmur RESP-CTAB EXT- No edema Pulses- Radial, 2+        Assessment & Plan:      Problem List Items Addressed This Visit    TOBACCO ABUSE   GERD (gastroesophageal reflux disease)    Restart prilosec      Relevant Medications   omeprazole (PRILOSEC) 40 MG capsule   COPD (chronic obstructive pulmonary disease) (HCC)    Well controlled, continues to smoke, no interest in cessation at this time Continue current meds       Relevant Medications   fluticasone (FLONASE) 50 MCG/ACT nasal spray   CAD (coronary artery disease) - Primary    Continue zocor, asymptomatic, followed by cardiology. Check fasting labs      Relevant Orders   CBC with Differential/Platelet   Comprehensive metabolic panel   Lipid panel    Other Visit Diagnoses    Need for prophylactic vaccination and inoculation against influenza        Relevant Orders    Flu Vaccine QUAD 36+ mos PF IM (Fluarix & Fluzone Quad PF) (Completed)    Allergic rhinitis due to pollen        [facility] given        Note: This dictation was prepared with Dragon dictation along with smaller Company secretary. Any transcriptional errors that result from this process are unintentional.

## 2015-07-17 NOTE — Assessment & Plan Note (Signed)
Well controlled, continues to smoke, no interest in cessation at this time Continue current meds

## 2015-07-17 NOTE — Assessment & Plan Note (Signed)
Continue zocor, asymptomatic, followed by cardiology. Check fasting labs

## 2015-07-17 NOTE — Patient Instructions (Signed)
Use flonase for drainage  We will call with lab results FLu shot given  F/U 6 months for Physical

## 2015-07-17 NOTE — Assessment & Plan Note (Signed)
Restart prilosec

## 2015-07-18 LAB — CBC WITH DIFFERENTIAL/PLATELET
Basophils Absolute: 0.1 10*3/uL (ref 0.0–0.1)
Basophils Relative: 2 % — ABNORMAL HIGH (ref 0–1)
Eosinophils Absolute: 0 10*3/uL (ref 0.0–0.7)
Eosinophils Relative: 0 % (ref 0–5)
HCT: 40.9 % (ref 39.0–52.0)
Hemoglobin: 14.6 g/dL (ref 13.0–17.0)
Lymphocytes Relative: 27 % (ref 12–46)
Lymphs Abs: 1.2 10*3/uL (ref 0.7–4.0)
MCH: 34.7 pg — ABNORMAL HIGH (ref 26.0–34.0)
MCHC: 35.7 g/dL (ref 30.0–36.0)
MCV: 97.1 fL (ref 78.0–100.0)
MPV: 9.1 fL (ref 8.6–12.4)
Monocytes Absolute: 0.4 10*3/uL (ref 0.1–1.0)
Monocytes Relative: 8 % (ref 3–12)
Neutro Abs: 2.9 10*3/uL (ref 1.7–7.7)
Neutrophils Relative %: 63 % (ref 43–77)
Platelets: 371 10*3/uL (ref 150–400)
RBC: 4.21 MIL/uL — ABNORMAL LOW (ref 4.22–5.81)
RDW: 13.5 % (ref 11.5–15.5)
WBC: 4.6 10*3/uL (ref 4.0–10.5)

## 2015-07-18 LAB — COMPREHENSIVE METABOLIC PANEL
ALT: 9 U/L (ref 9–46)
AST: 22 U/L (ref 10–35)
Albumin: 4 g/dL (ref 3.6–5.1)
Alkaline Phosphatase: 53 U/L (ref 40–115)
BUN: 4 mg/dL — ABNORMAL LOW (ref 7–25)
CO2: 29 mmol/L (ref 20–31)
Calcium: 9.2 mg/dL (ref 8.6–10.3)
Chloride: 96 mmol/L — ABNORMAL LOW (ref 98–110)
Creat: 0.7 mg/dL (ref 0.70–1.25)
Glucose, Bld: 107 mg/dL — ABNORMAL HIGH (ref 70–99)
Potassium: 4.8 mmol/L (ref 3.5–5.3)
Sodium: 134 mmol/L — ABNORMAL LOW (ref 135–146)
Total Bilirubin: 0.5 mg/dL (ref 0.2–1.2)
Total Protein: 6.9 g/dL (ref 6.1–8.1)

## 2015-07-18 LAB — LIPID PANEL
Cholesterol: 132 mg/dL (ref 125–200)
HDL: 95 mg/dL (ref >=40)
LDL Cholesterol: 29 mg/dL (ref <130)
Total CHOL/HDL Ratio: 1.4 Ratio (ref <=5.0)
Triglycerides: 39 mg/dL (ref <150)
VLDL: 8 mg/dL (ref <30)

## 2015-07-19 ENCOUNTER — Encounter: Payer: Self-pay | Admitting: *Deleted

## 2015-09-02 ENCOUNTER — Telehealth: Payer: Self-pay | Admitting: *Deleted

## 2015-09-02 NOTE — Telephone Encounter (Signed)
Received fax from [facility] care management stating that authorization [id number] has been approved to Dr. Verlin Dike cardiologist  Requesting provider: Verlin Dike  Treating provider: Verlin Dike  PCP: Lonell Grandchild [place]aston,MD  Number of visits:6  Start Date: 09/01/15  End Date: 02/28/16  Dx: I25.10-athscl heart disease of native coronary artery w/o ang pctrs       J44.9- chronic obstructive pulmonary disease,unspecified

## 2015-09-05 NOTE — Progress Notes (Signed)
Patient ID: Zachary Chambers, male   DOB: 1950-10-17, 65 y.o.   MRN: [id number]   64 y.o.  previously seen by Dr Doreatha Lew. New to me 5/12 . He has had infrequent medical F/U due to lack of insurance. Marland Kitchen CAD with stent in circ and latest cath 2003 with patent circ stent and new stent placed to LAD. Previous angina a cramping pain in chest. Has not had any of this. Smokes 1-2 ppd. CXR 01/14/11 with hyperinflation and no other abnormalities . Mild exertional dyspnea. No cough, sputum or fever. Has medicare now. Not taking lipitor Only taking asa and an over the counter acid pill. Counselled for less than 10 minutes on smoking cessation. Not motivated to quit. Active doing yard work. No palpitations, PND orthopnea. EF normal in past. Has n ano primary care doctor Lipids and liver not been checked in 2 years. Not had CXR since 02/13/13 reviewed and NAD Last labs  Lab Results  Component Value Date   [facility] 29 07/17/2015     Says he is seeing Dr Phillip Heal as primary    Occasional wheezing   Seen by [facility] for COPD in March some improvement with Spiriva  Reviewed CXR from July 2016  IMPRESSION: Hyperexpansion of the lungs consistent with chronic obstructive pulmonary disease. Mild right pleural effusion is noted.  Carotids 08/23/14 reviewed with midl plaque no significant stenosis  03/12/15 reviewed abdominal US no AAA    ROS: Denies fever, malais, weight loss, blurry vision, decreased visual acuity, cough, sputum, SOB, hemoptysis, pleuritic pain, palpitaitons, heartburn, abdominal pain, melena, lower extremity edema, claudication, or rash.  All other systems reviewed and negative  General: Affect appropriate Chronically ill male  HEENT: normal Neck supple with no adenopathy JVP normal right  bruits no thyromegaly Lungs diffuse rhonchi and wheezing and good diaphragmatic motion Heart:  S1/S2 no murmur, no rub, gallop or click PMI normal Abdomen: benighn, BS positve, no tenderness,  Palpable AAA no bruit.   No HSM or HJR Distal pulses intact with no bruits No edema Neuro non-focal Skin warm and dry No muscular weakness   Current Outpatient Prescriptions  Medication Sig Dispense Refill  . albuterol (PROVENTIL HFA;VENTOLIN HFA) 108 (90 BASE) MCG/ACT inhaler Inhale 2 puffs into the lungs every 6 (six) hours as needed for wheezing or shortness of breath. 1 Inhaler 2  . aspirin 81 MG tablet Take 81 mg by mouth daily.      . cyclobenzaprine (FLEXERIL) 5 MG tablet Take 1 tablet (5 mg total) by mouth 3 (three) times daily as needed for muscle spasms. 20 tablet 1  . fluticasone (FLONASE) 50 MCG/ACT nasal spray Place 2 sprays into both nostrils daily. 16 g 3  . Glucosamine HCl (GLUCOSAMINE PO) Take 1,500 mg by mouth daily.    . nitroGLYCERIN (NITROSTAT) 0.4 MG SL tablet Place 1 tablet (0.4 mg total) under the tongue every 5 (five) minutes as needed for chest pain. 15 tablet 1  . omeprazole (PRILOSEC) 40 MG capsule Take 1 capsule (40 mg total) by mouth daily. 30 capsule 6  . simvastatin (ZOCOR) 40 MG tablet Take 1 tablet (40 mg total) by mouth at bedtime. 30 tablet 2  . Tdap (ADACEL) 12-16-13.5 LF-MCG/0.5 injection Inject 0.5 mLs into the muscle once. 0.5 mL 0  . tiotropium (SPIRIVA) 18 MCG inhalation capsule Place 1 capsule (18 mcg total) into inhaler and inhale daily. 90 capsule 3  . traMADol (ULTRAM) 50 MG tablet Take 1 tablet (50 mg total) by mouth every 6 (  six) hours as needed for moderate pain. 30 tablet 0  . zoster vaccine live, PF, (ZOSTAVAX) 65784 UNT/0.65ML injection Inject 19,400 Units into the skin once. 1 each 0   No current facility-administered medications for this visit.    Allergies  Review of patient's allergies indicates no known allergies.  Electrocardiogram:   July 2016 SR rate 87 normal ECG   Assessment and Plan CAD: Stable with no angina and good activity level.  Continue medical Rx  Smoking: COPD needs CXR July emphysema consider lung cancer screening program  Bruit:   Plaque no stenosis f/u duplex in a year  AAA:  Palpable abdominal aorta normal size by duplex July 2016   Chol:  Cholesterol is at goal.  Continue current dose of statin and diet Rx.  No myalgias or side effects.  F/U  LFT's in 6 months. Lab Results  Component Value Date   LDLCALC 29 07/17/2015            F/U with me in 6 months

## 2015-09-06 ENCOUNTER — Encounter: Payer: Commercial Managed Care - HMO | Admitting: Cardiovascular Disease

## 2015-09-10 ENCOUNTER — Encounter: Payer: Self-pay | Admitting: Cardiovascular Disease

## 2015-10-21 NOTE — Progress Notes (Signed)
Patient ID: Zachary Chambers, male   DOB: 25-Sep-1950, 65 y.o.   MRN: [id number]   64 y.o.  previously seen by Dr Doreatha Lew.  He has had infrequent medical F/U due to lack of insurance. Marland Kitchen CAD with stent in circ and latest cath 2003 with patent circ stent and new stent placed to LAD. Previous angina a cramping pain in chest. Has not had any of this. Smokes 1-2 ppd. CXR 01/14/11 with hyperinflation and no other abnormalities . Mild exertional dyspnea. No cough, sputum or fever. Has medicare now. Not taking lipitor Only taking asa and an over the counter acid pill. Counselled for less than 10 minutes on smoking cessation. Not motivated to quit. Active doing yard work. No palpitations, PND orthopnea. EF normal in past. Has n ano primary care doctor Lipids and liver not been checked in 2 years. Not had CXR since 02/13/13 reviewed and NAD Last labs  Lab Results  Component Value Date   [facility] 29 07/17/2015     Says he is seeing Dr Phillip Heal as primary    Occasional wheezing   Seen by [facility] for COPD in March some improvement with Spiriva  Carotids 08/23/14 reviewed with midl plaque no significant stenosis    ROS: Denies fever, malais, weight loss, blurry vision, decreased visual acuity, cough, sputum, SOB, hemoptysis, pleuritic pain, palpitaitons, heartburn, abdominal pain, melena, lower extremity edema, claudication, or rash.  All other systems reviewed and negative  General: Affect appropriate Chronically ill male  HEENT: normal Neck supple with no adenopathy JVP normal right  bruits no thyromegaly Lungs diffuse rhonchi and wheezing and good diaphragmatic motion Heart:  S1/S2 no murmur, no rub, gallop or click PMI normal Abdomen: benighn, BS positve, no tenderness,  Palpable AAA no bruit.  No HSM or HJR Distal pulses intact with no bruits No edema Neuro non-focal Skin warm and dry No muscular weakness   Current Outpatient Prescriptions  Medication Sig Dispense Refill  . albuterol (PROVENTIL  HFA;VENTOLIN HFA) 108 (90 BASE) MCG/ACT inhaler Inhale 2 puffs into the lungs every 6 (six) hours as needed for wheezing or shortness of breath. 1 Inhaler 2  . aspirin 81 MG tablet Take 81 mg by mouth daily.      . cyclobenzaprine (FLEXERIL) 5 MG tablet Take 1 tablet (5 mg total) by mouth 3 (three) times daily as needed for muscle spasms. 20 tablet 1  . fluticasone (FLONASE) 50 MCG/ACT nasal spray Place 2 sprays into both nostrils daily. 16 g 3  . Glucosamine HCl (GLUCOSAMINE PO) Take 1,500 mg by mouth daily.    . nitroGLYCERIN (NITROSTAT) 0.4 MG SL tablet Place 1 tablet (0.4 mg total) under the tongue every 5 (five) minutes as needed for chest pain. 15 tablet 1  . omeprazole (PRILOSEC) 40 MG capsule Take 1 capsule (40 mg total) by mouth daily. 30 capsule 6  . simvastatin (ZOCOR) 40 MG tablet Take 1 tablet (40 mg total) by mouth at bedtime. 30 tablet 2  . tiotropium (SPIRIVA) 18 MCG inhalation capsule Place 1 capsule (18 mcg total) into inhaler and inhale daily. 90 capsule 3  . traMADol (ULTRAM) 50 MG tablet Take 1 tablet (50 mg total) by mouth every 6 (six) hours as needed for moderate pain. 30 tablet 0   No current facility-administered medications for this visit.    Allergies  Review of patient's allergies indicates no known allergies.  Electrocardiogram:  03/04/15  SR rate 87 normal ECG  10/28/15  SR rate 70 artifact nonspecific ST/T wave  changes   Assessment and Plan CAD: Stable with no angina and good activity level.  Continue medical Rx Stent to LAD/Circumflex last 2003   Smoking: COPD CXR July 2016 emphysema hyperexpansion f/u Dr Lamonte Sakai  Generic albuterol called in as provental not covered   Bruit:  Plaque no stenosis f/u duplex 08/2016   AAA:  Palpable AAA duplex 02/2015 no aneurysm stable  Chol:  Cholesterol is at goal.  Continue current dose of statin and diet Rx.  No myalgias or side effects.  F/U  LFT's in 6 months. Lab Results  Component Value Date   LDLCALC 29 07/17/2015    GERD:  Refill on prlosec called in low carb diet          F/U with me in 6 months   [name]

## 2015-10-25 ENCOUNTER — Encounter: Payer: Self-pay | Admitting: Cardiovascular Disease

## 2015-10-28 ENCOUNTER — Encounter: Payer: Self-pay | Admitting: Cardiovascular Disease

## 2015-10-28 ENCOUNTER — Ambulatory Visit (INDEPENDENT_AMBULATORY_CARE_PROVIDER_SITE_OTHER): Payer: Commercial Managed Care - HMO | Admitting: Cardiovascular Disease

## 2015-10-28 VITALS — BP 120/70 | HR 70 | Ht 68.0 in | Wt 146.4 lb

## 2015-10-28 DIAGNOSIS — I2510 Atherosclerotic heart disease of native coronary artery without angina pectoris: Secondary | ICD-10-CM

## 2015-10-28 MED ORDER — ALBUTEROL SULFATE HFA 108 (90 BASE) MCG/ACT IN AERS: 2.0000 | INHALATION_SPRAY | Freq: Four times a day (QID) | RESPIRATORY_TRACT | Status: DC | PRN

## 2015-10-28 MED ORDER — OMEPRAZOLE 40 MG PO CPDR
40.0000 mg | DELAYED_RELEASE_CAPSULE | Freq: Every day | ORAL | Status: DC
Start: 2015-10-28 — End: 2016-11-18

## 2015-10-28 NOTE — Patient Instructions (Addendum)
Medication Instructions:  Your physician recommends that you continue on your current medications as directed. Please refer to the Current Medication list given to you today.  Labwork: NONE  Testing/Procedures: NONE  Follow-Up: Your physician wants you to follow-up in: 6 months with Dr. [name]. You will receive a reminder letter in the mail two months in advance. If you don't receive a letter, please call our office to schedule the follow-up appointment.  Patient needs follow-up appointment with Dr. Lamonte Sakai.  If you need a refill on your cardiac medications before your next appointment, please call your pharmacy.

## 2015-12-18 ENCOUNTER — Encounter: Payer: Self-pay | Admitting: Internal Medicine

## 2016-01-06 ENCOUNTER — Telehealth: Payer: Self-pay | Admitting: Cardiovascular Disease

## 2016-01-06 NOTE — Telephone Encounter (Signed)
New Message   Pt called to make appt. No avail for dr [name] thru Sept. offered next avail. O/v with APP. Pt refuse and request to speak with RN. Please call back and discuss.

## 2016-01-09 NOTE — Telephone Encounter (Signed)
Informed patient that the schedule for all of September is not available at this time. The office will call him the end of next month to schedule an appointment in September.

## 2016-01-15 ENCOUNTER — Encounter: Payer: Commercial Managed Care - HMO | Admitting: Family Medicine

## 2016-02-10 ENCOUNTER — Telehealth: Payer: Self-pay

## 2016-02-10 NOTE — Telephone Encounter (Signed)
Left message for patient to call back to schedule appt with Dr. [name] in September.

## 2016-02-12 ENCOUNTER — Encounter: Payer: Self-pay | Admitting: Family Medicine

## 2016-02-12 ENCOUNTER — Ambulatory Visit (INDEPENDENT_AMBULATORY_CARE_PROVIDER_SITE_OTHER): Payer: Commercial Managed Care - HMO | Admitting: Family Medicine

## 2016-02-12 VITALS — BP 118/78 | HR 82 | Temp 97.9°F | Resp 16 | Ht 68.0 in | Wt 147.0 lb

## 2016-02-12 DIAGNOSIS — I25110 Atherosclerotic heart disease of native coronary artery with unstable angina pectoris: Secondary | ICD-10-CM | POA: Diagnosis not present

## 2016-02-12 DIAGNOSIS — Z125 Encounter for screening for malignant neoplasm of prostate: Secondary | ICD-10-CM | POA: Diagnosis not present

## 2016-02-12 DIAGNOSIS — F17200 Nicotine dependence, unspecified, uncomplicated: Secondary | ICD-10-CM

## 2016-02-12 DIAGNOSIS — J449 Chronic obstructive pulmonary disease, unspecified: Secondary | ICD-10-CM | POA: Diagnosis not present

## 2016-02-12 DIAGNOSIS — K219 Gastro-esophageal reflux disease without esophagitis: Secondary | ICD-10-CM

## 2016-02-12 DIAGNOSIS — Z1159 Encounter for screening for other viral diseases: Secondary | ICD-10-CM

## 2016-02-12 DIAGNOSIS — Z0000 Encounter for general adult medical examination without abnormal findings: Secondary | ICD-10-CM

## 2016-02-12 MED ORDER — SIMVASTATIN 40 MG PO TABS: 40.0000 mg | ORAL_TABLET | Freq: Every day | ORAL | Status: DC

## 2016-02-12 NOTE — Assessment & Plan Note (Signed)
Continue spiriva, change albuterol again

## 2016-02-12 NOTE — Assessment & Plan Note (Signed)
Controlled on prilosec.   

## 2016-02-12 NOTE — Assessment & Plan Note (Signed)
Counseled on tobacco cessation, he is not ready to quit despite COPD

## 2016-02-12 NOTE — Assessment & Plan Note (Signed)
rewviwed cardiology note, fasting labs No symptoms currently

## 2016-02-12 NOTE — Patient Instructions (Signed)
Continue current medications Work on the smoking! F/U 6 months

## 2016-02-12 NOTE — Progress Notes (Signed)
Patient ID: Zachary Chambers, male   DOB: 1951/07/25, 65 y.o.   MRN: [id number] Subjective:   Patient presents for Medicare Annual/Subsequent preventive examination.  Followed by cardiology for his coronary artery disease. He also has COPD which has been fairly well controlled. Review Past Medical/Family/Social: per EMR   Risk Factors  Current exercise habits:  Dietary issues discussed:   Cardiac risk factors: Obesity (BMI >= 30 kg/m2).   Depression Screen  (Note: if answer to either of the following is "Yes", a more complete depression screening is indicated)  Over the past two weeks, have you felt down, depressed or hopeless? No Over the past two weeks, have you felt little interest or pleasure in doing things? No Have you lost interest or pleasure in daily life? No Do you often feel hopeless? No Do you cry easily over simple problems? No   Activities of Daily Living  In your present state of health, do you have any difficulty performing the following activities?:  Driving? No  Managing money? No  Feeding yourself? No  Getting from bed to chair? No  Climbing a flight of stairs? No  Preparing food and eating?: No  Bathing or showering? No  Getting dressed: No  Getting to the toilet? No  Using the toilet:No  Moving around from place to place: No  In the past year have you fallen or had a near fall?:No  Are you sexually active? No  Do you have more than one partner? No   Hearing Difficulties: No  Do you often ask people to speak up or repeat themselves? No  Do you experience ringing or noises in your ears? No Do you have difficulty understanding soft or whispered voices? No  Do you feel that you have a problem with memory? No Do you often misplace items? No  Do you feel safe at home? Yes  Cognitive Testing  Alert? Yes Normal Appearance?Yes  Oriented to person? Yes Place? Yes  Time? Yes  Recall of three objects? Yes  Can perform simple calculations? Yes  Displays  appropriate judgment?Yes  Can read the correct time from a watch face?Yes   List the Names of Other Physician/Practitioners you currently use:   Cardiology   Screening Tests / Date Colonoscopy     Due this year            Zostavax - UTD Pneumonia- UTD  Tetanus/tdap UTD   ROS:  GEN- denies fatigue, fever, weight loss,weakness, recent illness HEENT- denies eye drainage, change in vision, nasal discharge, CVS- denies chest pain, palpitations RESP- denies SOB, cough, wheeze ABD- denies N/V, change in stools, abd pain GU- denies dysuria, hematuria, dribbling, incontinence MSK- denies joint pain, muscle aches, injury Neuro- denies headache, dizziness, syncope, seizure activity  pHYSICAL: GEN- NAD, alert and oriented x3 HEENT- PERRL, EOMI, non injected sclera, pink conjunctiva, MMM, oropharynx clear, poor dentition Neck- Supple, no thryomegaly CVS- RRR, no murmur RESP- distant BS, scattered wheeze ABD-NABS,soft,NT,ND GU- deferred  EXT- No edema Pulses- Radial, DP- 2+    Assessment:    Annual wellness medicare exam   Plan:    During the course of the visit the patient was educated and counseled about appropriate screening and preventive services including:   Screen Neg for depression.   Diet review for nutrition referral? Yes ____ Not Indicated __x__  Patient Instructions (the written plan) was given to the patient.  Medicare Attestation  I have personally reviewed:  The patient's medical and social history  Their use  of alcohol, tobacco or illicit drugs  Their current medications and supplements  The patient's functional ability including ADLs,fall risks, home safety risks, cognitive, and hearing and visual impairment  Diet and physical activities  Evidence for depression or mood disorders  The patient's weight, height, BMI, and visual acuity have been recorded in the chart. I have made referrals, counseling, and provided education to the patient based on review of  the above and I have provided the patient with a written personalized care plan for preventive services.

## 2016-02-13 LAB — COMPREHENSIVE METABOLIC PANEL
ALT: 8 U/L — ABNORMAL LOW (ref 9–46)
AST: 20 U/L (ref 10–35)
Albumin: 4.1 g/dL (ref 3.6–5.1)
Alkaline Phosphatase: 54 U/L (ref 40–115)
BUN: 4 mg/dL — ABNORMAL LOW (ref 7–25)
CO2: 25 mmol/L (ref 20–31)
Calcium: 9.1 mg/dL (ref 8.6–10.3)
Chloride: 94 mmol/L — ABNORMAL LOW (ref 98–110)
Creat: 0.73 mg/dL (ref 0.70–1.25)
Glucose, Bld: 99 mg/dL (ref 70–99)
Potassium: 4.8 mmol/L (ref 3.5–5.3)
Sodium: 130 mmol/L — ABNORMAL LOW (ref 135–146)
Total Bilirubin: 0.5 mg/dL (ref 0.2–1.2)
Total Protein: 6.9 g/dL (ref 6.1–8.1)

## 2016-02-13 LAB — CBC WITH DIFFERENTIAL/PLATELET
Basophils Absolute: 35 cells/uL (ref 0–200)
Basophils Relative: 1 %
Eosinophils Absolute: 35 cells/uL (ref 15–500)
Eosinophils Relative: 1 %
HCT: 39.1 % (ref 38.5–50.0)
Hemoglobin: 13.2 g/dL (ref 13.0–17.0)
Lymphocytes Relative: 31 %
Lymphs Abs: 1085 cells/uL (ref 850–3900)
MCH: 32.3 pg (ref 27.0–33.0)
MCHC: 33.8 g/dL (ref 32.0–36.0)
MCV: 95.6 fL (ref 80.0–100.0)
MPV: 8.9 fL (ref 7.5–12.5)
Monocytes Absolute: 420 cells/uL (ref 200–950)
Monocytes Relative: 12 %
Neutro Abs: 1925 cells/uL (ref 1500–7800)
Neutrophils Relative %: 55 %
Platelets: 363 10*3/uL (ref 140–400)
RBC: 4.09 MIL/uL — ABNORMAL LOW (ref 4.20–5.80)
RDW: 14.1 % (ref 11.0–15.0)
WBC: 3.5 10*3/uL — ABNORMAL LOW (ref 3.8–10.8)

## 2016-02-13 LAB — HEPATITIS C ANTIBODY: HCV Ab: NEGATIVE

## 2016-02-13 LAB — LIPID PANEL
Cholesterol: 136 mg/dL (ref 125–200)
HDL: 94 mg/dL (ref >=40)
LDL Cholesterol: 35 mg/dL (ref <130)
Total CHOL/HDL Ratio: 1.4 Ratio (ref <=5.0)
Triglycerides: 35 mg/dL (ref <150)
VLDL: 7 mg/dL (ref <30)

## 2016-02-13 LAB — PSA, MEDICARE: PSA: 0.47 ng/mL (ref <=4.00)

## 2016-02-20 ENCOUNTER — Encounter: Payer: Self-pay | Admitting: *Deleted

## 2016-02-24 ENCOUNTER — Ambulatory Visit (AMBULATORY_SURGERY_CENTER): Payer: Self-pay | Admitting: *Deleted

## 2016-02-24 VITALS — Ht 68.0 in | Wt 146.0 lb

## 2016-02-24 DIAGNOSIS — Z86010 Personal history of colonic polyps: Secondary | ICD-10-CM

## 2016-02-24 MED ORDER — NA SULFATE-K SULFATE-MG SULF 17.5-3.13-1.6 GM/177ML PO SOLN: ORAL | Status: DC

## 2016-02-24 NOTE — Progress Notes (Signed)
Patient denies any allergies to eggs or soy. Patient denies any problems with anesthesia/sedation. Patient denies any oxygen use at home and does not take any diet/weight loss medications.  

## 2016-03-05 NOTE — Telephone Encounter (Signed)
Patient has follow-up appointment in September. 

## 2016-03-09 ENCOUNTER — Encounter: Payer: Self-pay | Admitting: Internal Medicine

## 2016-03-09 ENCOUNTER — Ambulatory Visit (AMBULATORY_SURGERY_CENTER): Payer: Commercial Managed Care - HMO | Admitting: Internal Medicine

## 2016-03-09 VITALS — BP 112/59 | HR 61 | Temp 98.7°F | Resp 12 | Ht 68.0 in | Wt 146.0 lb

## 2016-03-09 DIAGNOSIS — Z86010 Personal history of colonic polyps: Secondary | ICD-10-CM | POA: Diagnosis present

## 2016-03-09 DIAGNOSIS — J449 Chronic obstructive pulmonary disease, unspecified: Secondary | ICD-10-CM | POA: Diagnosis not present

## 2016-03-09 DIAGNOSIS — D122 Benign neoplasm of ascending colon: Secondary | ICD-10-CM | POA: Diagnosis not present

## 2016-03-09 DIAGNOSIS — I2510 Atherosclerotic heart disease of native coronary artery without angina pectoris: Secondary | ICD-10-CM | POA: Diagnosis not present

## 2016-03-09 DIAGNOSIS — D125 Benign neoplasm of sigmoid colon: Secondary | ICD-10-CM | POA: Diagnosis not present

## 2016-03-09 NOTE — Progress Notes (Signed)
Called to room to assist during endoscopic procedure.  Patient ID and intended procedure confirmed with present staff. Received instructions for my participation in the procedure from the performing physician.  

## 2016-03-09 NOTE — Op Note (Signed)
[facility] Patient Name: Zachary Chambers Procedure Date: 03/09/2016 1:10 PM MRN: [id number] Endoscopist: Docia Chuck. Henrene Pastor , MD Age: 65 Referring MD:  Date of Birth: [birth year] Gender: Male Account #: [id number] Procedure:                Colonoscopy, with cold snare polypectomy x 2 Indications:              Surveillance: Personal history of colonic polyps                            (unknown histology) on last colonoscopy 3 years                            ago, High risk colon cancer surveillance: Personal                            history of multiple (3 or more) adenomas. Index                            examination 2005 with small tubular adenoma. Last                            examination July 2014 with 4 adenomas including 12                            mm Medicines:                Monitored Anesthesia Care Procedure:                Pre-Anesthesia Assessment:                           - Prior to the procedure, a History and Physical                            was performed, and patient medications and                            allergies were reviewed. The patient's tolerance of                            previous anesthesia was also reviewed. The risks                            and benefits of the procedure and the sedation                            options and risks were discussed with the patient.                            All questions were answered, and informed consent                            was obtained. Prior Anticoagulants: The patient has  taken no previous anticoagulant or antiplatelet                            agents. ASA Grade Assessment: III - A patient with                            severe systemic disease. After reviewing the risks                            and benefits, the patient was deemed in                            satisfactory condition to undergo the procedure.                           After obtaining informed  consent, the colonoscope                            was passed under direct vision. Throughout the                            procedure, the patient's blood pressure, pulse, and                            oxygen saturations were monitored continuously. The                            Model CF-HQ190L [id number]) scope was introduced                            through the anus and advanced to the the cecum,                            identified by appendiceal orifice and ileocecal                            valve. The ileocecal valve, appendiceal orifice,                            and rectum were photographed. The quality of the                            bowel preparation was excellent. The colonoscopy                            was performed without difficulty. The patient                            tolerated the procedure well. The bowel preparation                            used was SUPREP. Scope In: 1:26:49 PM Scope Out: 1:38:51 PM Scope Withdrawal Time: 0 hours 9 minutes 5 seconds  Total Procedure Duration: 0 hours  12 minutes 2 seconds  Findings:                 Two polyps were found in the sigmoid colon and                            ascending colon. The polyps were 2 to 5 mm in size.                            These polyps were removed with a cold snare.                            Resection and retrieval were complete.                           Multiple small-mouthed diverticula were found in                            the sigmoid colon.                           Internal hemorrhoids were found during retroflexion.                           The exam was otherwise without abnormality on                            direct and retroflexion views. Complications:            No immediate complications. Estimated blood loss:                            None. Estimated Blood Loss:     Estimated blood loss: none. Impression:               - Two 2 to 5 mm polyps in the sigmoid colon and in                             the ascending colon, removed with a cold snare.                            Resected and retrieved.                           - Diverticulosis in the sigmoid colon.                           - Internal hemorrhoids.                           - The examination was otherwise normal on direct                            and retroflexion views. Recommendation:           - Repeat colonoscopy in 5 years for surveillance.                           -  Patient has a contact number available for                            emergencies. The signs and symptoms of potential                            delayed complications were discussed with the                            patient. Return to normal activities tomorrow.                            Written discharge instructions were provided to the                            patient.                           - Resume previous diet.                           - Continue present medications.                           - Await pathology results. Docia Chuck. Henrene Pastor, MD 03/09/2016 1:52:53 PM This report has been signed electronically.

## 2016-03-09 NOTE — Progress Notes (Signed)
Report given to PACU RN, vss 

## 2016-03-09 NOTE — Patient Instructions (Signed)
YOU HAD AN ENDOSCOPIC PROCEDURE TODAY AT [facility]:   Refer to the procedure report that was given to you for any specific questions about what was found during the examination.  If the procedure report does not answer your questions, please call your gastroenterologist to clarify.  If you requested that your care partner not be given the details of your procedure findings, then the procedure report has been included in a sealed envelope for you to review at your convenience later.  YOU SHOULD EXPECT: Some feelings of bloating in the abdomen. Passage of more gas than usual.  Walking can help get rid of the air that was put into your GI tract during the procedure and reduce the bloating. If you had a lower endoscopy (such as a colonoscopy or flexible sigmoidoscopy) you may notice spotting of blood in your stool or on the toilet paper. If you underwent a bowel prep for your procedure, you may not have a normal bowel movement for a few days.  Please Note:  You might notice some irritation and congestion in your nose or some drainage.  This is from the oxygen used during your procedure.  There is no need for concern and it should clear up in a day or so.  SYMPTOMS TO REPORT IMMEDIATELY:   Following lower endoscopy (colonoscopy or flexible sigmoidoscopy):  Excessive amounts of blood in the stool  Significant tenderness or worsening of abdominal pains  Swelling of the abdomen that is new, acute  Fever of 100F or higher  For urgent or emergent issues, a gastroenterologist can be reached at any hour by calling [phone].   DIET: Your first meal following the procedure should be a small meal and then it is ok to progress to your normal diet. Heavy or fried foods are harder to digest and may make you feel nauseous or bloated.  Likewise, meals heavy in dairy and vegetables can increase bloating.  Drink plenty of fluids but you should avoid alcoholic beverages for 24  hours.  ACTIVITY:  You should plan to take it easy for the rest of today and you should NOT DRIVE or use heavy machinery until tomorrow (because of the sedation medicines used during the test).    FOLLOW UP: Our staff will call the number listed on your records the next business day following your procedure to check on you and address any questions or concerns that you may have regarding the information given to you following your procedure. If we do not reach you, we will leave a message.  However, if you are feeling well and you are not experiencing any problems, there is no need to return our call.  We will assume that you have returned to your regular daily activities without incident.  If any biopsies were taken you will be contacted by phone or by letter within the next 1-3 weeks.  Please call us at [phone] if you have not heard about the biopsies in 3 weeks.    SIGNATURES/CONFIDENTIALITY: You and/or your care partner have signed paperwork which will be entered into your electronic medical record.  These signatures attest to the fact that that the information above on your After Visit Summary has been reviewed and is understood.  Full responsibility of the confidentiality of this discharge information lies with you and/or your care-partner.  Please read polyp,diverticulosis, high fiber diet, and hemorrhoid handouts provided.

## 2016-03-10 ENCOUNTER — Telehealth: Payer: Self-pay | Admitting: *Deleted

## 2016-03-10 NOTE — Telephone Encounter (Signed)
  Follow up Call-  Call back number 03/09/2016  Post procedure Call Back phone  # [phone]  Permission to leave phone message Yes  Some recent data might be hidden     Patient questions:  Do you have a fever, pain , or abdominal swelling? No. Pain Score  0 *  Have you tolerated food without any problems? Yes.    Have you been able to return to your normal activities? Yes.    Do you have any questions about your discharge instructions: Diet   No. Medications  No. Follow up visit  No.  Do you have questions or concerns about your Care? No.  Actions: * If pain score is 4 or above: No action needed, pain <4.

## 2016-03-12 ENCOUNTER — Other Ambulatory Visit: Payer: Self-pay | Admitting: *Deleted

## 2016-03-12 ENCOUNTER — Encounter: Payer: Self-pay | Admitting: Internal Medicine

## 2016-03-12 MED ORDER — ALBUTEROL SULFATE HFA 108 (90 BASE) MCG/ACT IN AERS: 2.0000 | INHALATION_SPRAY | Freq: Four times a day (QID) | RESPIRATORY_TRACT | 2 refills | Status: DC | PRN

## 2016-03-12 NOTE — Telephone Encounter (Signed)
Received fax requesting refill on Proventil.   Refill appropriate and filled per protocol.

## 2016-04-02 ENCOUNTER — Encounter: Payer: Self-pay | Admitting: Cardiovascular Disease

## 2016-04-13 ENCOUNTER — Encounter: Payer: Self-pay | Admitting: Cardiovascular Disease

## 2016-04-16 ENCOUNTER — Encounter: Payer: Self-pay | Admitting: Cardiovascular Disease

## 2016-04-23 ENCOUNTER — Other Ambulatory Visit: Payer: Self-pay | Admitting: Family Medicine

## 2016-04-28 ENCOUNTER — Ambulatory Visit: Payer: Commercial Managed Care - HMO | Admitting: Cardiovascular Disease

## 2016-05-04 ENCOUNTER — Telehealth: Payer: Self-pay | Admitting: Family Medicine

## 2016-05-04 NOTE — Telephone Encounter (Signed)
Received fax from [facility] back Auth# [id number] Treating Provider [name] # of Visits 6 Start Date 04/28/2016  End Date 10/25/2016 CPT 99499 DX I25.10

## 2016-06-17 ENCOUNTER — Other Ambulatory Visit: Payer: Self-pay | Admitting: Family Medicine

## 2016-06-26 ENCOUNTER — Other Ambulatory Visit: Payer: Self-pay | Admitting: *Deleted

## 2016-06-26 DIAGNOSIS — J418 Mixed simple and mucopurulent chronic bronchitis: Secondary | ICD-10-CM

## 2016-06-26 MED ORDER — TIOTROPIUM BROMIDE MONOHYDRATE 18 MCG IN CAPS: 18.0000 ug | ORAL_CAPSULE | Freq: Every day | RESPIRATORY_TRACT | 3 refills | Status: DC

## 2016-07-12 NOTE — Progress Notes (Signed)
Patient ID: Zachary Chambers, male   DOB: 1951-07-22, 65 y.o.   MRN: [id number]   65 y.o.  previously seen by Dr Doreatha Lew.  He has had infrequent medical F/U due to lack of insurance. Marland Kitchen CAD with stent in circ and latest cath 2003 with patent circ stent and new stent placed to LAD. Previous angina a cramping pain in chest. Has not had any of this. Smokes 1-2 ppd. CXR 01/14/11 with hyperinflation and no other abnormalities . Mild exertional dyspnea. No cough, sputum or fever. Has medicare now. Not taking lipitor Only taking asa and an over the counter acid pill. Counselled for less than 10 minutes on smoking cessation. Not motivated to quit. Active doing yard work. No palpitations, PND orthopnea. EF normal in past  Lab Results  Component Value Date   [place] 35 02/12/2016     Says he is seeing Dr Zachary Chambers as primary    Occasional wheezing   Seen by [facility] for COPD in March some improvement with Spiriva  Carotids 08/23/14 reviewed with midl plaque no significant stenosis    ROS: Denies fever, malais, weight loss, blurry vision, decreased visual acuity, cough, sputum, SOB, hemoptysis, pleuritic pain, palpitaitons, heartburn, abdominal pain, melena, lower extremity edema, claudication, or rash.  All other systems reviewed and negative  General: Affect appropriate Chronically ill male  HEENT: normal Neck supple with no adenopathy JVP normal right  bruits no thyromegaly Lungs diffuse rhonchi and wheezing and good diaphragmatic motion Heart:  S1/S2 no murmur, no rub, gallop or click PMI normal Abdomen: benighn, BS positve, no tenderness,  Palpable AAA no bruit.  No HSM or HJR Distal pulses intact with no bruits No edema Neuro non-focal Skin warm and dry No muscular weakness   Current Outpatient Prescriptions  Medication Sig Dispense Refill  . aspirin 81 MG tablet Take 81 mg by mouth daily.      . cyclobenzaprine (FLEXERIL) 5 MG tablet Take 1 tablet (5 mg total) by mouth 3 (three) times daily as  needed for muscle spasms. 20 tablet 1  . fluticasone (FLONASE) 50 MCG/ACT nasal spray Place 2 sprays into both nostrils daily. 16 g 3  . Glucosamine HCl (GLUCOSAMINE PO) Take 1,500 mg by mouth daily.    Marland Kitchen NITROSTAT 0.4 MG SL tablet dissolve 1 TABLET UNDER THE TONGUE every 5 minutes AS NEEDED CHEST PAIN 25 tablet 0  . omeprazole (PRILOSEC) 40 MG capsule Take 1 capsule (40 mg total) by mouth daily. 30 capsule 11  . simvastatin (ZOCOR) 40 MG tablet Take 20 mg by mouth daily.    Marland Kitchen tiotropium (SPIRIVA) 18 MCG inhalation capsule Place 1 capsule (18 mcg total) into inhaler and inhale daily. 90 capsule 3  . traMADol (ULTRAM) 50 MG tablet Take 1 tablet (50 mg total) by mouth every 6 (six) hours as needed for moderate pain. 30 tablet 0  . VENTOLIN HFA 108 (90 Base) MCG/ACT inhaler INHALE 2 PUFFS into THE lungs EVERY 6 HOURS AS NEEDED FOR WHEEZING OR SHORTNESS OF BREATH 18 g 1   No current facility-administered medications for this visit.     Allergies  Patient has no known allergies.  Electrocardiogram:  03/04/15  SR rate 87 normal ECG  10/28/15  SR rate 70 artifact nonspecific ST/T wave changes   Assessment and Plan CAD: Stable with no angina and good activity level.  Continue medical Rx Stent to LAD/Circumflex last 2003   Smoking: COPD CXR July 2016 emphysema hyperexpansion f/u Dr Lamonte Sakai  Generic albuterol called in  as provental not covered  F/u with pulmonary needs flu shot   Bruit:  Plaque no stenosis f/u duplex 08/2016   AAA:  Palpable AAA duplex 02/2015 no aneurysm stable  Chol:  Cholesterol is at goal.  Continue current dose of statin and diet Rx.  No myalgias or side effects.  F/U  LFT's in 6 months. Lab Results  Component Value Date   [place]CALC 35 02/12/2016   GERD:  Refill on prlosec called in low carb diet          F/U with me in 6 months   [name]

## 2016-07-15 ENCOUNTER — Ambulatory Visit (INDEPENDENT_AMBULATORY_CARE_PROVIDER_SITE_OTHER): Payer: Commercial Managed Care - HMO | Admitting: Cardiovascular Disease

## 2016-07-15 ENCOUNTER — Encounter (INDEPENDENT_AMBULATORY_CARE_PROVIDER_SITE_OTHER): Payer: Self-pay

## 2016-07-15 ENCOUNTER — Encounter: Payer: Self-pay | Admitting: Cardiovascular Disease

## 2016-07-15 VITALS — BP 126/64 | HR 72 | Ht 68.0 in | Wt 146.0 lb

## 2016-07-15 DIAGNOSIS — Z23 Encounter for immunization: Secondary | ICD-10-CM

## 2016-07-15 DIAGNOSIS — I2510 Atherosclerotic heart disease of native coronary artery without angina pectoris: Secondary | ICD-10-CM

## 2016-07-15 DIAGNOSIS — J449 Chronic obstructive pulmonary disease, unspecified: Secondary | ICD-10-CM

## 2016-07-15 NOTE — Patient Instructions (Addendum)
Medication Instructions:  Your physician recommends that you continue on your current medications as directed. Please refer to the Current Medication list given to you today.  Labwork: NONE  Testing/Procedures: Your physician has requested that you have a carotid duplex in January. This test is an ultrasound of the carotid arteries in your neck. It looks at blood flow through these arteries that supply the brain with blood. Allow one hour for this exam. There are no restrictions or special instructions.  Your physician has requested that you have an abdominal aorta duplex in January. During this test, an ultrasound is used to evaluate the aorta. Allow 30 minutes for this exam. Do not eat after midnight the day before and avoid carbonated beverages  Follow-Up: Your physician wants you to follow-up in: 6 months with Dr. [name]. You will receive a reminder letter in the mail two months in advance. If you don't receive a letter, please call our office to schedule the follow-up appointment.   If you need a refill on your cardiac medications before your next appointment, please call your pharmacy.

## 2016-08-18 ENCOUNTER — Ambulatory Visit: Payer: Commercial Managed Care - HMO | Admitting: Family Medicine

## 2016-08-24 ENCOUNTER — Inpatient Hospital Stay (HOSPITAL_COMMUNITY): Admission: RE | Admit: 2016-08-24 | Payer: Commercial Managed Care - HMO | Source: Ambulatory Visit

## 2016-08-24 ENCOUNTER — Ambulatory Visit (HOSPITAL_COMMUNITY)
Admission: RE | Admit: 2016-08-24 | Discharge: 2016-08-24 | Disposition: A | Discharge status: Home / Self Care | Payer: Medicare HMO | Source: Ambulatory Visit | Attending: Cardiovascular Disease | Admitting: Cardiovascular Disease

## 2016-08-24 DIAGNOSIS — I6523 Occlusion and stenosis of bilateral carotid arteries: Secondary | ICD-10-CM | POA: Diagnosis not present

## 2016-08-24 DIAGNOSIS — I2510 Atherosclerotic heart disease of native coronary artery without angina pectoris: Secondary | ICD-10-CM | POA: Insufficient documentation

## 2016-08-26 ENCOUNTER — Encounter: Payer: Self-pay | Admitting: Family Medicine

## 2016-08-26 ENCOUNTER — Ambulatory Visit (INDEPENDENT_AMBULATORY_CARE_PROVIDER_SITE_OTHER): Payer: Commercial Managed Care - HMO | Admitting: Family Medicine

## 2016-08-26 VITALS — BP 118/58 | HR 80 | Temp 97.8°F | Resp 16 | Ht 68.0 in | Wt 148.0 lb

## 2016-08-26 DIAGNOSIS — Z23 Encounter for immunization: Secondary | ICD-10-CM

## 2016-08-26 DIAGNOSIS — J449 Chronic obstructive pulmonary disease, unspecified: Secondary | ICD-10-CM

## 2016-08-26 DIAGNOSIS — I25110 Atherosclerotic heart disease of native coronary artery with unstable angina pectoris: Secondary | ICD-10-CM

## 2016-08-26 DIAGNOSIS — F17200 Nicotine dependence, unspecified, uncomplicated: Secondary | ICD-10-CM

## 2016-08-26 NOTE — Progress Notes (Signed)
   Subjective:    Patient ID: Zachary Chambers, male    DOB: 1950/10/18, 66 y.o.   MRN: [id number]  Patient presents for 6 month F/U (is not fasting)  Patient here for six-month follow-up on his chronic medical problems in his medications.  COPD he is taking his Spiriva and his albuterol as needed. He does continue to smoke.He still gets episodes of wheezing and shortness of breath. He states that he has to use his albuterol every day sometimes in the evening in spite of the Spiriva. He did pick up his smoking which he rolls his own tobacco during a time when his daughter in law moved in with him but states that now that she has gone his stress is decreased and he has decreased his smoking again.  Coronary artery disease he has not had any chest pain he is taking his statin drug as prescribed as well as his aspirin    Review Of Systems:  GEN- denies fatigue, fever, weight loss,weakness, recent illness HEENT- denies eye drainage, change in vision, nasal discharge, CVS- denies chest pain, palpitations RESP- + SOB, cough, +wheeze ABD- denies N/V, change in stools, abd pain GU- denies dysuria, hematuria, dribbling, incontinence MSK- denies joint pain, muscle aches, injury Neuro- denies headache, dizziness, syncope, seizure activity       Objective:    BP (!) 118/58 (BP Location: Left Arm, Patient Position: Sitting, Cuff Size: Normal)   Pulse 80   Temp 97.8 F (36.6 C) (Oral)   Resp 16   Ht 5\' 8"  (1.727 m)   Wt 148 lb (67.1 kg)   SpO2 94%   BMI 22.50 kg/m  GEN- NAD, alert and oriented x3 HEENT- PERRL, EOMI, non injected sclera, pink conjunctiva, MMM, oropharynx clear CVS- RRR, no murmur RESP-CTAB, good air movement  EXT- No edema Pulses- Radial  2+        Assessment & Plan:      Problem List Items Addressed This Visit    TOBACCO ABUSE   COPD (chronic obstructive pulmonary disease) ([place])    We'll give him a trial of Symbicort in addition to the Spiriva since he is having  use his rescue inhaler so much. Continue to reiterate importance of tobacco cessation. I did give his pneumonia vaccine 23 today.      CAD (coronary artery disease) - Primary    He isn't doing fairly well with regards to cardiac standpoint blood pressures been controlled his lipids are controlled we'll continue to monitor for now.       Other Visit Diagnoses    Need for prophylactic vaccination against Streptococcus pneumoniae (pneumococcus)          Note: This dictation was prepared with Dragon dictation along with smaller phrase technology. Any transcriptional errors that result from this process are unintentional.

## 2016-08-26 NOTE — Patient Instructions (Signed)
F/U 6 months for Physical  Pneumonia vaccine given

## 2016-08-27 ENCOUNTER — Encounter: Payer: Self-pay | Admitting: Family Medicine

## 2016-08-27 NOTE — Assessment & Plan Note (Signed)
He isn't doing fairly well with regards to cardiac standpoint blood pressures been controlled his lipids are controlled we'll continue to monitor for now.

## 2016-08-27 NOTE — Assessment & Plan Note (Signed)
We'll give him a trial of Symbicort in addition to the Spiriva since he is having use his rescue inhaler so much. Continue to reiterate importance of tobacco cessation. I did give his pneumonia vaccine 23 today.

## 2016-09-07 ENCOUNTER — Ambulatory Visit (INDEPENDENT_AMBULATORY_CARE_PROVIDER_SITE_OTHER): Payer: Medicare HMO | Admitting: Emergency Medicine

## 2016-09-07 ENCOUNTER — Encounter: Payer: Self-pay | Admitting: Emergency Medicine

## 2016-09-07 DIAGNOSIS — F17200 Nicotine dependence, unspecified, uncomplicated: Secondary | ICD-10-CM | POA: Diagnosis not present

## 2016-09-07 DIAGNOSIS — J449 Chronic obstructive pulmonary disease, unspecified: Secondary | ICD-10-CM | POA: Diagnosis not present

## 2016-09-07 NOTE — Progress Notes (Signed)
Subjective:    Patient ID: Zachary Chambers, male    DOB: 1951/06/24, 66 y.o.   MRN: [id number]  HPI 66 yo man, smoker (22 pk-yrs), hx of CAD + PTCI, GERD.  Followed by Drs Buelah Manis and Homer Glen. 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 His back pain limits him also. He still smokes 1.0- 1.5 pk a day. He has occasional cough, not every day. Occasionally wheezes, especially at night when laying supine. Has a SABA, has never used it.   ROV 10/31/13 -- follow up for COPD. Last time we started spiriva, discussed smoking cessation. He feels that the spiriva has helped > less rattling, less wheeze, able to do a bit more. He is coughing, may be a bit more since he started the spiriva. He has never used his SABA. Still smoking same amt 1-1.5pk/day  ROV 04/06/14 -- follow up visit for tobacco use and COPD. He has benefited from Spiriva.. He continues to smoke - rolls his own. He has cut down. He isn't ready to set a quit. He still gardens, still active. Discussed LDCT screening today.   ROV 12/11/14 -- follow-up visit for COPD. FEV1 1.39L (43% pred).  He is able to exert, work in the garden, shop. He does have some periods of "panic" that can cause some SOB. He is on Spiriva qd. Has not needed albuterol. He is having cough and nasal congestion. He is smoking about the same amount. He has thought about the LDCT screening - doesn't want to do it right now.  He has had the Pneumovax.   ROV 09/08/15 -- This follow-up visit for COPD and tobacco use. I have treated him in the past with Spiriva, continues to take it. He continues to smoke about 1 pack a day. He is using albuterol about a few times a week. He was tried on Symbicort starting 08/26/16, feels that he is benefiting from it.  He has not had any flares since last visit. Flu shot up to date.     CAT Score 04/06/2014  Total CAT Score 12    Review of Systems  Constitutional: Negative for fever and  unexpected weight change.  HENT: Negative for congestion, dental problem, ear pain, nosebleeds, postnasal drip, rhinorrhea, sinus pressure, sneezing, sore throat and trouble swallowing.   Eyes: Negative for redness and itching.  Respiratory: Positive for cough, shortness of breath and wheezing. Negative for chest tightness.   Cardiovascular: Negative for palpitations and leg swelling.  Gastrointestinal: Negative for nausea and vomiting.  Genitourinary: Negative for dysuria.  Musculoskeletal: Negative for joint swelling.  Skin: Negative for rash.  Neurological: Negative for headaches.  Hematological: Bruises/bleeds easily.  Psychiatric/Behavioral: Negative for dysphoric mood. The patient is not nervous/anxious.     No Known Allergies   Outpatient Medications Prior to Visit  Medication Sig Dispense Refill  . aspirin 81 MG tablet Take 81 mg by mouth daily.      . cyclobenzaprine (FLEXERIL) 5 MG tablet Take 1 tablet (5 mg total) by mouth 3 (three) times daily as needed for muscle spasms. 20 tablet 1  . fluticasone (FLONASE) 50 MCG/ACT nasal spray Place 2 sprays into both nostrils daily. 16 g 3  . Glucosamine HCl (GLUCOSAMINE PO) Take 1,500 mg by mouth daily.    Marland Kitchen NITROSTAT 0.4 MG SL tablet dissolve 1 TABLET UNDER THE TONGUE every 5 minutes AS NEEDED CHEST PAIN 25 tablet 0  . omeprazole (PRILOSEC) 40  MG capsule Take 1 capsule (40 mg total) by mouth daily. 30 capsule 11  . simvastatin (ZOCOR) 40 MG tablet Take 20 mg by mouth daily.    Marland Kitchen tiotropium (SPIRIVA) 18 MCG inhalation capsule Place 1 capsule (18 mcg total) into inhaler and inhale daily. 90 capsule 3  . traMADol (ULTRAM) 50 MG tablet Take 1 tablet (50 mg total) by mouth every 6 (six) hours as needed for moderate pain. 30 tablet 0  . VENTOLIN HFA 108 (90 Base) MCG/ACT inhaler INHALE 2 PUFFS into THE lungs EVERY 6 HOURS AS NEEDED FOR WHEEZING OR SHORTNESS OF BREATH 18 g 1   No facility-administered medications prior to visit.           Objective:   Physical Exam Vitals:   09/07/16 1359  BP: 134/60  BP Location: Left Arm  Cuff Size: Normal  Pulse: 80  SpO2: 94%  Weight: 147 lb (66.7 kg)  Height: 5\' 8"  (1.727 m)   Gen: Pleasant, elderly man, in no distress,  normal affect  ENT: No lesions,  mouth clear,  oropharynx clear, no postnasal drip  Neck: No JVD, no TMG, no carotid bruits  Lungs: No use of accessory muscles, distant, clear without rales or rhonchi  Cardiovascular: RRR, heart sounds normal, no murmur or gallops, no peripheral edema  Musculoskeletal: No deformities, no cyanosis or clubbing  Neuro: alert, non focal  Skin: Warm, no lesions or rashes      Assessment & Plan:  COPD (chronic obstructive pulmonary disease) Trial of Symbicort added to Spiriva appears to have been beneficial.   Please continue your Spiriva daily Continue Symbicort 2 puffs twice a day. Rinse and gargle after using  Take albuterol 2 puffs up to every 4 hours if needed for shortness of breath.  Flu shot up to date.  You need to decrease your cigarettes. We discussed setting a goal to cut down to 1/2 a pack by our next visit Follow with Dr Lamonte Sakai in 6 months or sooner if you have any problems   TOBACCO ABUSE Discussed cessation with him in detail today. Gave him tips and recommendations for how to cut down. I don't believe he is ready to set a full quit date. We set a goal of getting down to half a pack a day by our next visit in 6 months.  Baltazar Apo, MD, PhD 09/07/2016, 2:32 PM [facility] [phone] or if no answer [phone]

## 2016-09-07 NOTE — Assessment & Plan Note (Signed)
Discussed cessation with him in detail today. Gave him tips and recommendations for how to cut down. I don't believe he is ready to set a full quit date. We set a goal of getting down to half a pack a day by our next visit in 6 months.

## 2016-09-07 NOTE — Assessment & Plan Note (Signed)
Trial of Symbicort added to Spiriva appears to have been beneficial.   Please continue your Spiriva daily Continue Symbicort 2 puffs twice a day. Rinse and gargle after using  Take albuterol 2 puffs up to every 4 hours if needed for shortness of breath.  Flu shot up to date.  You need to decrease your cigarettes. We discussed setting a goal to cut down to 1/2 a pack by our next visit Follow with Dr Lamonte Sakai in 6 months or sooner if you have any problems

## 2016-09-07 NOTE — Patient Instructions (Signed)
Please continue your Spiriva daily Continue Symbicort 2 puffs twice a day. Rinse and gargle after using  Take albuterol 2 puffs up to every 4 hours if needed for shortness of breath.  Flu shot up to date.  You need to decrease your cigarettes. We discussed setting a goal to cut down to 1/2 a pack by our next visit Follow with Dr Lamonte Sakai in 6 months or sooner if you have any problems

## 2016-09-10 ENCOUNTER — Ambulatory Visit (HOSPITAL_COMMUNITY)
Admission: RE | Admit: 2016-09-10 | Discharge: 2016-09-10 | Disposition: A | Discharge status: Home / Self Care | Payer: Medicare HMO | Source: Ambulatory Visit | Attending: Cardiovascular Disease | Admitting: Cardiovascular Disease

## 2016-09-10 DIAGNOSIS — I700 Atherosclerosis of aorta: Secondary | ICD-10-CM | POA: Diagnosis not present

## 2016-09-10 DIAGNOSIS — R1900 Intra-abdominal and pelvic swelling, mass and lump, unspecified site: Secondary | ICD-10-CM | POA: Diagnosis not present

## 2016-09-10 DIAGNOSIS — I2510 Atherosclerotic heart disease of native coronary artery without angina pectoris: Secondary | ICD-10-CM | POA: Diagnosis not present

## 2016-09-10 DIAGNOSIS — I708 Atherosclerosis of other arteries: Secondary | ICD-10-CM | POA: Insufficient documentation

## 2016-09-16 ENCOUNTER — Telehealth: Payer: Self-pay | Admitting: Emergency Medicine

## 2016-09-16 MED ORDER — BUDESONIDE-FORMOTEROL FUMARATE 160-4.5 MCG/ACT IN AERO: 2.0000 | INHALATION_SPRAY | Freq: Two times a day (BID) | RESPIRATORY_TRACT | 3 refills | Status: DC

## 2016-09-16 NOTE — Telephone Encounter (Signed)
Spoke with pt. And he looked at his box and verified which Symbicort that he takes he states it is 160. I have sent in refills to his pharmacy of choice nothing further is needed at this time.    Patient Instructions by Collene Gobble, MD at 09/07/2016 2:00 PM   Author: Collene Gobble, MD Author Type: Physician Filed: 09/07/2016 2:30 PM  Note Status: Signed Cosign: Cosign Not Required Encounter Date: 09/07/2016 2:00 PM  Editor: Collene Gobble, MD (Physician)    Please continue your Spiriva daily Continue Symbicort 2 puffs twice a day. Rinse and gargle after using  Take albuterol 2 puffs up to every 4 hours if needed for shortness of breath.  Flu shot up to date.  You need to decrease your cigarettes. We discussed setting a goal to cut down to 1/2 a pack by our next visit Follow with Dr Lamonte Sakai in 6 months or sooner if you have any problems

## 2016-09-23 ENCOUNTER — Telehealth: Payer: Self-pay | Admitting: Cardiovascular Disease

## 2016-09-23 NOTE — Telephone Encounter (Signed)
Follow Up:; ° ° °Returning your call. °

## 2016-09-24 NOTE — Telephone Encounter (Signed)
Left message for patient to call back. Called patient about  AAA duplex. Per Dr. [name], no significant aneurysm.

## 2016-09-29 ENCOUNTER — Other Ambulatory Visit: Payer: Self-pay | Admitting: Family Medicine

## 2016-09-30 NOTE — Telephone Encounter (Signed)
Reviewed results with patient who verbalized understanding. 

## 2016-09-30 NOTE — Telephone Encounter (Signed)
Follow up      Calling to get AAA duplex results

## 2016-11-10 ENCOUNTER — Telehealth: Payer: Self-pay | Admitting: Family Medicine

## 2016-11-10 NOTE — Telephone Encounter (Signed)
CB# [phone]  Patient called in states he can't afford symbicort he states that it does work for him however he can't afford it. He would like to know if we have any samples. He does have paperwork to see if he qualifies for financial assistance.

## 2016-11-11 MED ORDER — BUDESONIDE-FORMOTEROL FUMARATE 160-4.5 MCG/ACT IN AERO
2.0000 | INHALATION_SPRAY | Freq: Two times a day (BID) | RESPIRATORY_TRACT | 12 refills | Status: DC
Start: 2016-11-11 — End: 2016-12-10

## 2016-11-11 NOTE — Telephone Encounter (Signed)
Samples left at front desk for patient and patient made aware to come and pick them up.   Prescription assistance forms completed and left on MD desk for signature.

## 2016-11-18 ENCOUNTER — Other Ambulatory Visit: Payer: Self-pay | Admitting: Family Medicine

## 2016-11-18 ENCOUNTER — Other Ambulatory Visit: Payer: Self-pay | Admitting: Cardiovascular Disease

## 2016-11-18 NOTE — Telephone Encounter (Signed)
Refill appropriate 

## 2016-12-10 ENCOUNTER — Other Ambulatory Visit: Payer: Self-pay | Admitting: *Deleted

## 2016-12-10 MED ORDER — BUDESONIDE-FORMOTEROL FUMARATE 160-4.5 MCG/ACT IN AERO: 2.0000 | INHALATION_SPRAY | Freq: Two times a day (BID) | RESPIRATORY_TRACT | 12 refills | Status: DC

## 2016-12-23 ENCOUNTER — Encounter: Payer: Self-pay | Admitting: Cardiovascular Disease

## 2017-01-12 NOTE — Progress Notes (Signed)
Patient ID: Zachary Chambers, male   DOB: 1951-05-16, 66 y.o.   MRN: [id number]   65 y.o.  previously seen by Zachary Chambers.  He has had infrequent medical F/U due to lack of insurance. Marland Kitchen CAD with stent in circ and latest cath 2003 with patent circ stent and new stent placed to LAD. Previous angina a cramping pain in chest. Has not had any of this. Smokes 1-2 ppd. CXR 01/14/11 with hyperinflation and no other abnormalities . Mild exertional dyspnea. No cough, sputum or fever. Has medicare now. Not taking lipitor Only taking asa and an over the counter acid pill. Counselled for less than 10 minutes on smoking cessation. Not motivated to quit. Active doing yard work. No palpitations, PND orthopnea. EF normal in past  Lab Results  Component Value Date   Zachary Chambers 35 02/12/2016     Says he is seeing Zachary Chambers as primary    Occasional wheezing   Seen by Zachary Chambers for COPD in March some improvement with Spiriva  Carotids 08/23/14 reviewed with midl plaque no significant stenosis   Tuesday got bit by something while working on tractor and has horrible inflammation and  Infection on his left arm No systemic fever but arm has been hot and redness spreading   ROS: Denies fever, malais, weight loss, blurry vision, decreased visual acuity, cough, sputum, SOB, hemoptysis, pleuritic pain, palpitaitons, heartburn, abdominal pain, melena, lower extremity edema, claudication, or rash.  All other systems reviewed and negative  General: Affect appropriate Chronically ill male  HEENT: normal Neck supple with no adenopathy JVP normal no bruits no thyromegaly Lungs exp wheezing and Chambers diaphragmatic motion Heart:  S1/S2 no murmur, no rub, gallop or click PMI normal Abdomen: benighn, BS positve, no tenderness, no AAA no bruit.  No HSM or HJR Distal pulses intact with no bruits No edema Neuro non-focal Skin chronic bruising both arms but left arm is swollen with superimposed erythema And well demarcated rash ? Erysipelas   No muscular weakness    Current Outpatient Prescriptions  Medication Sig Dispense Refill  . aspirin 81 MG tablet Take 81 mg by mouth daily.      . budesonide-formoterol (SYMBICORT) 160-4.5 MCG/ACT inhaler Inhale 2 puffs into the lungs 2 (two) times daily. 1 Inhaler 12  . cyclobenzaprine (FLEXERIL) 5 MG tablet Take 1 tablet (5 mg total) by mouth 3 (three) times daily as needed for muscle spasms. 20 tablet 1  . fluticasone (FLONASE) 50 MCG/ACT nasal spray USE 2 SPRAYS IN EACH NOSTRIL EVERY DAY 16 g 2  . Glucosamine HCl (GLUCOSAMINE PO) Take 1,500 mg by mouth daily.    Marland Kitchen NITROSTAT 0.4 MG SL tablet dissolve 1 TABLET UNDER THE TONGUE every 5 minutes AS NEEDED CHEST PAIN 25 tablet 0  . omeprazole (PRILOSEC) 40 MG capsule TAKE 1 CAPSULE BY MOUTH EVERY DAY 30 capsule 2  . simvastatin (ZOCOR) 40 MG tablet Take 20 mg by mouth daily.    Marland Kitchen tiotropium (SPIRIVA) 18 MCG inhalation capsule Place 1 capsule (18 mcg total) into inhaler and inhale daily. 90 capsule 3  . traMADol (ULTRAM) 50 MG tablet Take 1 tablet (50 mg total) by mouth every 6 (six) hours as needed for moderate pain. 30 tablet 0  . VENTOLIN HFA 108 (90 Base) MCG/ACT inhaler INHALE 2 PUFFS into THE lungs EVERY 6 HOURS AS NEEDED FOR WHEEZING OR SHORTNESS OF BREATH 18 g 3   No current facility-administered medications for this visit.     Allergies  Patient has  no known allergies.  Electrocardiogram:  03/04/15  SR rate 87 normal ECG  10/28/15  SR rate 70 artifact nonspecific ST/T wave changes    Assessment and Plan CAD: Stable with no angina and Chambers activity level.  Continue medical Rx Stent to LAD/Circumflex last 2003   Smoking: COPD CXR July 2016 emphysema hyperexpansion f/u Zachary Chambers  Generic albuterol called in as provental not covered  F/u with pulmonary  shot  Needs updated CXR and ? CT lung cancer screening program   Bruit:  Plaque no stenosis f/u duplex 2020   AAA:  Palpable  Abdominal aorta no AAA by duplex reviewed 08/24/16 Chol:   Cholesterol is at goal.  Continue current dose of statin and diet Rx.  No myalgias or side effects.  F/U  LFT's in 6 months. Lab Results  Component Value Date   LDLCALC 35 02/12/2016   GERD:  Refill on prlosec called in low carb diet  Cellulitis:  ? Erysipelas Called Zachary Chambers and spoke to her personally Start Augmentin 875/125 bid Warning signs of fever , spread or ulceration discussed when to go to ER  Mad f/u appt for him to see ID in am          F/U with me in 6 months   Zachary Chambers

## 2017-01-14 ENCOUNTER — Ambulatory Visit (INDEPENDENT_AMBULATORY_CARE_PROVIDER_SITE_OTHER): Payer: Medicare HMO | Admitting: Cardiovascular Disease

## 2017-01-14 ENCOUNTER — Encounter: Payer: Self-pay | Admitting: Cardiovascular Disease

## 2017-01-14 ENCOUNTER — Encounter (INDEPENDENT_AMBULATORY_CARE_PROVIDER_SITE_OTHER): Payer: Self-pay

## 2017-01-14 VITALS — BP 124/60 | HR 92 | Ht 68.0 in | Wt 143.6 lb

## 2017-01-14 DIAGNOSIS — I2510 Atherosclerotic heart disease of native coronary artery without angina pectoris: Secondary | ICD-10-CM | POA: Diagnosis not present

## 2017-01-14 DIAGNOSIS — F17200 Nicotine dependence, unspecified, uncomplicated: Secondary | ICD-10-CM | POA: Diagnosis not present

## 2017-01-14 MED ORDER — AMOXICILLIN-POT CLAVULANATE 875-125 MG PO TABS: 1.0000 | ORAL_TABLET | Freq: Two times a day (BID) | ORAL | 0 refills | Status: DC

## 2017-01-14 NOTE — Patient Instructions (Addendum)
Medication Instructions:  Your physician has recommended you make the following change in your medication:  1-START Agmentin 875/125 mg by mouth twice daily.  Labwork: NONE  Testing/Procedures: Non-Cardiac CT scanning for lung cancer screening, (CAT scanning), is a noninvasive, special x-ray that produces cross-sectional images of the body using x-rays and a computer. CT scans help physicians diagnose and treat medical conditions. For some CT exams, a contrast material is used to enhance visibility in the area of the body being studied. CT scans provide greater clarity and reveal more details than regular x-ray exams.  Follow-Up: Your physician wants you to follow-up in: 6 months with Dr. [name]. You will receive a reminder letter in the mail two months in advance. If you don't receive a letter, please call our office to schedule the follow-up appointment.   If you need a refill on your cardiac medications before your next appointment, please call your pharmacy.

## 2017-01-18 ENCOUNTER — Encounter: Payer: Self-pay | Admitting: Internal Medicine

## 2017-01-18 ENCOUNTER — Ambulatory Visit (INDEPENDENT_AMBULATORY_CARE_PROVIDER_SITE_OTHER): Payer: Medicare HMO | Admitting: Internal Medicine

## 2017-01-18 VITALS — BP 139/75 | HR 79 | Temp 98.3°F | Ht 68.0 in | Wt 144.0 lb

## 2017-01-18 DIAGNOSIS — L853 Xerosis cutis: Secondary | ICD-10-CM

## 2017-01-18 DIAGNOSIS — L03114 Cellulitis of left upper limb: Secondary | ICD-10-CM | POA: Diagnosis not present

## 2017-01-18 NOTE — Progress Notes (Signed)
RFV: cellulitis  Patient ID: Zachary Chambers, male   DOB: 27-Dec-1950, 66 y.o.   MRN: [id number]  HPI 28yoM with hx of CAD, on anticoagulation. Seen by his cardiologist last week with new onset of left arm cellulitis which started on 5/30 with increasing redness, swelling and tightness. He was prescribed amox/clav, which he started on June 1st. Has improved since started taking oral abtx. He initially had warmth erythema nad swelling to his left arm no pain with wrist flexion, but now much improved. Still not normalized. All throughout, no signs of systemic signs  Outpatient Encounter Prescriptions as of 01/18/2017  Medication Sig  . amoxicillin-clavulanate (AUGMENTIN) 875-125 MG tablet Take 1 tablet by mouth 2 (two) times daily.  Marland Kitchen aspirin 81 MG tablet Take 81 mg by mouth daily.    . budesonide-formoterol (SYMBICORT) 160-4.5 MCG/ACT inhaler Inhale 2 puffs into the lungs 2 (two) times daily.  . cyclobenzaprine (FLEXERIL) 5 MG tablet Take 1 tablet (5 mg total) by mouth 3 (three) times daily as needed for muscle spasms.  . fluticasone (FLONASE) 50 MCG/ACT nasal spray USE 2 SPRAYS IN EACH NOSTRIL EVERY DAY  . NITROSTAT 0.4 MG SL tablet dissolve 1 TABLET UNDER THE TONGUE every 5 minutes AS NEEDED CHEST PAIN  . omeprazole (PRILOSEC) 40 MG capsule TAKE 1 CAPSULE BY MOUTH EVERY DAY  . simvastatin (ZOCOR) 40 MG tablet Take 20 mg by mouth daily.  Marland Kitchen tiotropium (SPIRIVA) 18 MCG inhalation capsule Place 1 capsule (18 mcg total) into inhaler and inhale daily.  . traMADol (ULTRAM) 50 MG tablet Take 1 tablet (50 mg total) by mouth every 6 (six) hours as needed for moderate pain.  . VENTOLIN HFA 108 (90 Base) MCG/ACT inhaler INHALE 2 PUFFS into THE lungs EVERY 6 HOURS AS NEEDED FOR WHEEZING OR SHORTNESS OF BREATH  . Glucosamine HCl (GLUCOSAMINE PO) Take 1,500 mg by mouth daily.   No facility-administered encounter medications on file as of 01/18/2017.      Patient Active Problem List   Diagnosis Date Noted  .  Bruit 01/22/2014  . Muscle spasm of back 09/20/2013  . COPD (chronic obstructive pulmonary disease) ([place]) 01/14/2011  . CAD (coronary artery disease) 01/14/2011  . GERD (gastroesophageal reflux disease) 01/14/2011  . TOBACCO ABUSE 07/25/2009  . DEGENERATIVE JOINT DISEASE 07/25/2009  . ROTATOR CUFF SYNDROME, RIGHT 07/25/2009     There are no preventive care reminders to display for this patient.   Review of Systems Review of Systems  Constitutional: Negative for fever, chills, diaphoresis, activity change, appetite change, fatigue and unexpected weight change.  HENT: Negative for congestion, sore throat, rhinorrhea, sneezing, trouble swallowing and sinus pressure.  Eyes: Negative for photophobia and visual disturbance.  Respiratory: Negative for cough, chest tightness, shortness of breath, wheezing and stridor.  Cardiovascular: Negative for chest pain, palpitations and leg swelling.  Gastrointestinal: Negative for nausea, vomiting, abdominal pain, diarrhea, constipation, blood in stool, abdominal distention and anal bleeding.  Genitourinary: Negative for dysuria, hematuria, flank pain and difficulty urinating.  Musculoskeletal: Negative for myalgias, back pain, joint swelling, arthralgias and gait problem.  Skin: + rash Neurological: Negative for dizziness, tremors, weakness and light-headedness.  Hematological: Negative for adenopathy. Does not bruise/bleed easily.  Psychiatric/Behavioral: Negative for behavioral problems, confusion, sleep disturbance, dysphoric mood, decreased concentration and agitation.    Physical Exam   BP 139/75   Pulse 79   Temp 98.3 F (36.8 C) (Oral)   Ht 5' 8"  (1.727 m)   Wt 144 lb (65.3 kg)  BMI 21.90 kg/m   gen = a x o by 3 in nad Heent= PERRLA, EOMI, no scleral icterus, op clear Ext= trace edema to left forearm Skin = bilateral chronic echymosis, but left arm is faintly red, blanching, with still slight residual swelling. The patient has  scattered excoriations. Legs dry flaky skin CBC Lab Results  Component Value Date   WBC 3.5 (L) 02/12/2016   RBC 4.09 (L) 02/12/2016   HGB 13.2 02/12/2016   HCT 39.1 02/12/2016   PLT 363 02/12/2016   MCV 95.6 02/12/2016   MCH 32.3 02/12/2016   MCHC 33.8 02/12/2016   RDW 14.1 02/12/2016   LYMPHSABS 1,085 02/12/2016   MONOABS 420 02/12/2016   EOSABS 35 02/12/2016    BMET Lab Results  Component Value Date   NA 130 (L) 02/12/2016   K 4.8 02/12/2016   CL 94 (L) 02/12/2016   CO2 25 02/12/2016   GLUCOSE 99 02/12/2016   BUN 4 (L) 02/12/2016   CREATININE 0.73 02/12/2016   CALCIUM 9.1 02/12/2016   GFRNONAA >60 04/23/2010   GFRAA  04/23/2010    >60        The eGFR has been calculated using the MDRD equation. This calculation has not been validated in all clinical situations. eGFR's persistently <60 mL/min signify possible Chronic Kidney Disease.      Assessment and Plan  Cellulitis, left forearm = finish up 10 day amox/clav.  Improving but still slightly swollen  Dry skin = lotion arms and legs help minimize breaks in skin

## 2017-01-20 ENCOUNTER — Ambulatory Visit (INDEPENDENT_AMBULATORY_CARE_PROVIDER_SITE_OTHER)
Admission: RE | Admit: 2017-01-20 | Discharge: 2017-01-20 | Disposition: A | Discharge status: Home / Self Care | Payer: Medicare HMO | Source: Ambulatory Visit | Attending: Cardiovascular Disease | Admitting: Cardiovascular Disease

## 2017-01-20 DIAGNOSIS — Z87891 Personal history of nicotine dependence: Secondary | ICD-10-CM | POA: Diagnosis not present

## 2017-01-20 DIAGNOSIS — F17210 Nicotine dependence, cigarettes, uncomplicated: Secondary | ICD-10-CM | POA: Diagnosis not present

## 2017-01-20 DIAGNOSIS — F17200 Nicotine dependence, unspecified, uncomplicated: Secondary | ICD-10-CM

## 2017-01-26 ENCOUNTER — Other Ambulatory Visit: Payer: Self-pay | Admitting: Family Medicine

## 2017-02-23 ENCOUNTER — Encounter: Payer: Commercial Managed Care - HMO | Admitting: Family Medicine

## 2017-02-25 ENCOUNTER — Other Ambulatory Visit: Payer: Self-pay | Admitting: Family Medicine

## 2017-03-02 ENCOUNTER — Ambulatory Visit (INDEPENDENT_AMBULATORY_CARE_PROVIDER_SITE_OTHER): Payer: Medicare HMO | Admitting: Family Medicine

## 2017-03-02 ENCOUNTER — Encounter: Payer: Self-pay | Admitting: Family Medicine

## 2017-03-02 VITALS — BP 130/82 | HR 88 | Temp 98.3°F | Resp 16 | Ht 68.0 in | Wt 144.0 lb

## 2017-03-02 DIAGNOSIS — Z125 Encounter for screening for malignant neoplasm of prostate: Secondary | ICD-10-CM | POA: Diagnosis not present

## 2017-03-02 DIAGNOSIS — J449 Chronic obstructive pulmonary disease, unspecified: Secondary | ICD-10-CM

## 2017-03-02 DIAGNOSIS — I25110 Atherosclerotic heart disease of native coronary artery with unstable angina pectoris: Secondary | ICD-10-CM | POA: Diagnosis not present

## 2017-03-02 DIAGNOSIS — Z0000 Encounter for general adult medical examination without abnormal findings: Secondary | ICD-10-CM | POA: Diagnosis not present

## 2017-03-02 NOTE — Patient Instructions (Addendum)
F/u 6 MONTHS  We will call with lab results

## 2017-03-02 NOTE — Progress Notes (Signed)
Subjective:   Patient presents for Medicare Annual/Subsequent preventive examination.   No concerns today   Due for fasting labs Last cardiology note reviewed  COPD- He is unable to afford the Symbicort therefore has been using samples. He is on his Spiriva. He does go better with the use of the Symbicort. Review Past Medical/Family/Social: Per EMR   Risk Factors  Current exercise habits: walks , stays active  Dietary issues discussed: yes  Cardiac risk factors: CAD/COPD  Depression Screen  (Note: if answer to either of the following is "Yes", a more complete depression screening is indicated)  Over the past two weeks, have you felt down, depressed or hopeless? No Over the past two weeks, have you felt little interest or pleasure in doing things? No Have you lost interest or pleasure in daily life? No Do you often feel hopeless? No Do you cry easily over simple problems? No   Activities of Daily Living  In your present state of health, do you have any difficulty performing the following activities?:  Driving? No  Managing money? No  Feeding yourself? No  Getting from bed to chair? No  Climbing a flight of stairs? yes Preparing food and eating?: No  Bathing or showering? No  Getting dressed: No  Getting to the toilet? No  Using the toilet:No  Moving around from place to place: No  In the past year have you fallen or had a near fall?:No  Are you sexually active? No  Do you have more than one partner? No   Hearing Difficulties: No  Do you often ask people to speak up or repeat themselves? No  Do you experience ringing or noises in your ears? No Do you have difficulty understanding soft or whispered voices? No  Do you feel that you have a problem with memory? No Do you often misplace items? No  Do you feel safe at home? Yes  Cognitive Testing  Alert? Yes Normal Appearance?Yes  Oriented to person? Yes Place? Yes  Time? Yes  Recall of three objects? Yes  Can perform  simple calculations? Yes  Displays appropriate judgment?Yes  Can read the correct time from a watch face?Yes   List the Names of Other Physician/Practitioners you currently use:  Cardiology/ COPD   Screening Tests / Date Colonoscopy  - UTD                   Zostavax - unable to afford  Pneumonia- UTD Influenza Vaccine  UTD Tetanus/tdap UTD  ROS: GEN- denies fatigue, fever, weight loss,weakness, recent illness HEENT- denies eye drainage, change in vision, nasal discharge, CVS- denies chest pain, palpitations RESP- denies SOB, cough, wheeze ABD- denies N/V, change in stools, abd pain GU- denies dysuria, hematuria, dribbling, incontinence MSK- denies joint pain, muscle aches, injury Neuro- denies headache, dizziness, syncope, seizure activity  Physical:  GEN- NAD, alert and oriented x3 HEENT- PERRL, EOMI, non injected sclera, pink conjunctiva, MMM, oropharynx clear Neck- Supple, no thryomegaly CVS- RRR, no murmur RESP-few scattered wheeze , normal WOB, good air movement  ABD-NABS,soft,NT,ND EXT- No edema Pulses- Radial, DP- 2+    Assessment:    Annual wellness medicare exam   Plan:    During the course of the visit the patient was educated and counseled about appropriate screening and preventive services including:  Fasting labs obtained Preventative care UTD, PSA level done, Hep C neg  Screen Neg  for depression.   COPD- damples of symbicort given, he did not qualify for patient  assistance   Diet review for nutrition referral? Yes ____ Not Indicated __x__  Patient Instructions (the written plan) was given to the patient.  Medicare Attestation  I have personally reviewed:  The patient's medical and social history  Their use of alcohol, tobacco or illicit drugs  Their current medications and supplements  The patient's functional ability including ADLs,fall risks, home safety risks, cognitive, and hearing and visual impairment  Diet and physical activities   Evidence for depression or mood disorders  The patient's weight, height, BMI, and visual acuity have been recorded in the chart. I have made referrals, counseling, and provided education to the patient based on review of the above and I have provided the patient with a written personalized care plan for preventive services.

## 2017-03-03 LAB — CBC WITH DIFFERENTIAL/PLATELET
Basophils Absolute: 93 cells/uL (ref 0–200)
Basophils Relative: 3 %
Eosinophils Absolute: 62 cells/uL (ref 15–500)
Eosinophils Relative: 2 %
HCT: 40.6 % (ref 38.5–50.0)
Hemoglobin: 13.8 g/dL (ref 13.0–17.0)
Lymphocytes Relative: 34 %
Lymphs Abs: 1054 cells/uL (ref 850–3900)
MCH: 33.3 pg — ABNORMAL HIGH (ref 27.0–33.0)
MCHC: 34 g/dL (ref 32.0–36.0)
MCV: 97.8 fL (ref 80.0–100.0)
MPV: 8.7 fL (ref 7.5–12.5)
Monocytes Absolute: 434 cells/uL (ref 200–950)
Monocytes Relative: 14 %
Neutro Abs: 1457 cells/uL — ABNORMAL LOW (ref 1500–7800)
Neutrophils Relative %: 47 %
Platelets: 311 10*3/uL (ref 140–400)
RBC: 4.15 MIL/uL — ABNORMAL LOW (ref 4.20–5.80)
RDW: 13.6 % (ref 11.0–15.0)
WBC: 3.1 10*3/uL — ABNORMAL LOW (ref 3.8–10.8)

## 2017-03-03 LAB — COMPREHENSIVE METABOLIC PANEL
ALT: 8 U/L — ABNORMAL LOW (ref 9–46)
AST: 20 U/L (ref 10–35)
Albumin: 4 g/dL (ref 3.6–5.1)
Alkaline Phosphatase: 63 U/L (ref 40–115)
BUN: 5 mg/dL — ABNORMAL LOW (ref 7–25)
CO2: 26 mmol/L (ref 20–31)
Calcium: 8.7 mg/dL (ref 8.6–10.3)
Chloride: 97 mmol/L — ABNORMAL LOW (ref 98–110)
Creat: 0.88 mg/dL (ref 0.70–1.25)
Glucose, Bld: 94 mg/dL (ref 70–99)
Potassium: 4 mmol/L (ref 3.5–5.3)
Sodium: 131 mmol/L — ABNORMAL LOW (ref 135–146)
Total Bilirubin: 0.5 mg/dL (ref 0.2–1.2)
Total Protein: 6.6 g/dL (ref 6.1–8.1)

## 2017-03-03 LAB — LIPID PANEL
Cholesterol: 126 mg/dL (ref <200)
HDL: 82 mg/dL (ref >40)
LDL Cholesterol: 36 mg/dL (ref <100)
Total CHOL/HDL Ratio: 1.5 Ratio (ref <5.0)
Triglycerides: 42 mg/dL (ref <150)
VLDL: 8 mg/dL (ref <30)

## 2017-03-03 LAB — PSA: PSA: 0.5 ng/mL (ref <=4.0)

## 2017-03-04 ENCOUNTER — Other Ambulatory Visit: Payer: Self-pay | Admitting: *Deleted

## 2017-03-04 MED ORDER — ZOSTER VAC RECOMB ADJUVANTED 50 MCG/0.5ML IM SUSR: 0.5000 mL | Freq: Once | INTRAMUSCULAR | 0 refills | Status: AC

## 2017-03-05 ENCOUNTER — Other Ambulatory Visit: Payer: Self-pay | Admitting: *Deleted

## 2017-03-05 ENCOUNTER — Other Ambulatory Visit: Payer: Self-pay | Admitting: Family Medicine

## 2017-03-05 DIAGNOSIS — D72829 Elevated white blood cell count, unspecified: Secondary | ICD-10-CM

## 2017-03-05 DIAGNOSIS — D72820 Lymphocytosis (symptomatic): Secondary | ICD-10-CM

## 2017-03-05 MED ORDER — ZOSTER VAC RECOMB ADJUVANTED 50 MCG/0.5ML IM SUSR: 0.5000 mL | Freq: Once | INTRAMUSCULAR | 0 refills | Status: AC

## 2017-03-05 MED ORDER — ZOSTER VAC RECOMB ADJUVANTED 50 MCG/0.5ML IM SUSR: 0.5000 mL | Freq: Once | INTRAMUSCULAR | 0 refills | Status: DC

## 2017-03-08 ENCOUNTER — Encounter: Payer: Self-pay | Admitting: Emergency Medicine

## 2017-03-08 ENCOUNTER — Ambulatory Visit (INDEPENDENT_AMBULATORY_CARE_PROVIDER_SITE_OTHER): Payer: Medicare HMO | Admitting: Emergency Medicine

## 2017-03-08 DIAGNOSIS — F17200 Nicotine dependence, unspecified, uncomplicated: Secondary | ICD-10-CM | POA: Diagnosis not present

## 2017-03-08 DIAGNOSIS — J449 Chronic obstructive pulmonary disease, unspecified: Secondary | ICD-10-CM

## 2017-03-08 NOTE — Patient Instructions (Signed)
Please continue your Spiriva as you are taking it Take albuterol 2 puffs up to every 4 hours if needed for shortness of breath.  Work hard on decreasing your cigarettes. Set a goal of 1/2 a pack a day by next visit.  Flu shot in the Fall Follow with Dr Lamonte Sakai in 6 months or sooner if you have any problems

## 2017-03-08 NOTE — Progress Notes (Signed)
Subjective:    Patient ID: Zachary Chambers, male    DOB: September 18, 1950, 66 y.o.   MRN: [id number]  HPI 66 yo man, smoker (30 pk-yrs), hx of CAD + PTCI, GERD.  Followed by Drs Buelah Manis and Lorenz Park. 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 His back pain limits him also. He still smokes 1.0- 1.5 pk a day. He has occasional cough, not every day. Occasionally wheezes, especially at night when laying supine. Has a SABA, has never used it.   ROV 10/31/13 -- follow up for COPD. Last time we started spiriva, discussed smoking cessation. He feels that the spiriva has helped > less rattling, less wheeze, able to do a bit more. He is coughing, may be a bit more since he started the spiriva. He has never used his SABA. Still smoking same amt 1-1.5pk/day  ROV 04/06/14 -- follow up visit for tobacco use and COPD. He has benefited from Spiriva.. He continues to smoke - rolls his own. He has cut down. He isn't ready to set a quit. He still gardens, still active. Discussed LDCT screening today.   ROV 12/11/14 -- follow-up visit for COPD. FEV1 1.39L (43% pred).  He is able to exert, work in the garden, shop. He does have some periods of "panic" that can cause some SOB. He is on Spiriva qd. Has not needed albuterol. He is having cough and nasal congestion. He is smoking about the same amount. He has thought about the LDCT screening - doesn't want to do it right now.  He has had the Pneumovax.   ROV 09/07/16 -- This follow-up visit for COPD and tobacco use. I have treated him in the past with Spiriva, continues to take it. He continues to smoke about 1 pack a day. He is using albuterol about a few times a week. He was tried on Symbicort starting 08/26/16, feels that he is benefiting from it.  He has not had any flares since last visit. Flu shot up to date.   ROV 03/08/17 -- Patient has a history of COPD and tobacco use. He reports that he paces himself, is able to exert.  He does have to stop to rest after walking 100 yrds. He is on Spiriva. He uses albuterol about 2x a day. Continues to smoke a pack a day. He was treated for L UE cellulitis in May, no Ae since our last visit. Minimal cough. Having some nasal drainage - uses flonase reliably.    No flowsheet data found.  Review of Systems  Constitutional: Negative for fever and unexpected weight change.  HENT: Negative for congestion, dental problem, ear pain, nosebleeds, postnasal drip, rhinorrhea, sinus pressure, sneezing, sore throat and trouble swallowing.   Eyes: Negative for redness and itching.  Respiratory: Positive for cough, shortness of breath and wheezing. Negative for chest tightness.   Cardiovascular: Negative for palpitations and leg swelling.  Gastrointestinal: Negative for nausea and vomiting.  Genitourinary: Negative for dysuria.  Musculoskeletal: Negative for joint swelling.  Skin: Negative for rash.  Neurological: Negative for headaches.  Hematological: Bruises/bleeds easily.  Psychiatric/Behavioral: Negative for dysphoric mood. The patient is not nervous/anxious.     No Known Allergies   Outpatient Medications Prior to Visit  Medication Sig Dispense Refill  . aspirin 81 MG tablet Take 81 mg by mouth daily.      . budesonide-formoterol (SYMBICORT) 160-4.5 MCG/ACT inhaler Inhale 2 puffs into the lungs 2 (two) times  daily. 1 Inhaler 12  . cyclobenzaprine (FLEXERIL) 5 MG tablet Take 1 tablet (5 mg total) by mouth 3 (three) times daily as needed for muscle spasms. 20 tablet 1  . fluticasone (FLONASE) 50 MCG/ACT nasal spray USE 2 SPRAYS IN EACH NOSTRIL EVERY DAY 16 g 6  . Glucosamine HCl (GLUCOSAMINE PO) Take 1,500 mg by mouth daily.    Marland Kitchen NITROSTAT 0.4 MG SL tablet dissolve 1 TABLET UNDER THE TONGUE every 5 minutes AS NEEDED CHEST PAIN 25 tablet 0  . omeprazole (PRILOSEC) 40 MG capsule TAKE 1 CAPSULE BY MOUTH EVERY DAY 30 capsule 3  . simvastatin (ZOCOR) 40 MG tablet TAKE 1 TABLET BY  MOUTH AT BEDTIME 30 tablet 3  . tiotropium (SPIRIVA) 18 MCG inhalation capsule Place 1 capsule (18 mcg total) into inhaler and inhale daily. 90 capsule 3  . traMADol (ULTRAM) 50 MG tablet Take 1 tablet (50 mg total) by mouth every 6 (six) hours as needed for moderate pain. 30 tablet 0  . VENTOLIN HFA 108 (90 Base) MCG/ACT inhaler INHALE 2 PUFFS into THE lungs EVERY 6 HOURS AS NEEDED FOR WHEEZING OR SHORTNESS OF BREATH 18 g 3  . simvastatin (ZOCOR) 40 MG tablet Take 20 mg by mouth daily.     No facility-administered medications prior to visit.          Objective:   Physical Exam Vitals:   03/08/17 1331  BP: 128/60  Pulse: 86  SpO2: 93%  Weight: 142 lb 3.2 oz (64.5 kg)  Height: 5\' 8"  (1.727 m)   Gen: Pleasant, elderly man, in no distress,  normal affect  ENT: No lesions,  mouth clear,  oropharynx clear, no postnasal drip  Neck: No JVD, no TMG, no carotid bruits  Lungs: No use of accessory muscles, distant, clear without rales or rhonchi  Cardiovascular: RRR, heart sounds normal, no murmur or gallops, no peripheral edema  Musculoskeletal: No deformities, no cyanosis or clubbing  Neuro: alert, non focal  Skin: Warm, no lesions or rashes      Assessment & Plan:  TOBACCO ABUSE Discussed cessation today. He is not ready to set a quit date. Instead we talked about cutting down in setting a goal of have a pack daily by  next visit.  COPD (chronic obstructive pulmonary disease) Please continue your Spiriva as you are taking it Take albuterol 2 puffs up to every 4 hours if needed for shortness of breath.  Flu shot in the Fall Follow with Dr Lamonte Sakai in 6 months or sooner if you have any problems  Baltazar Apo, MD, PhD 03/08/2017, 1:55 PM [facility] [phone] or if no answer [phone]

## 2017-03-08 NOTE — Assessment & Plan Note (Signed)
Discussed cessation today. He is not ready to set a quit date. Instead we talked about cutting down in setting a goal of have a pack daily by  next visit.

## 2017-03-08 NOTE — Assessment & Plan Note (Signed)
Please continue your Spiriva as you are taking it Take albuterol 2 puffs up to every 4 hours if needed for shortness of breath.  Flu shot in the Fall Follow with Dr Lamonte Sakai in 6 months or sooner if you have any problems

## 2017-03-18 ENCOUNTER — Other Ambulatory Visit: Payer: Medicare HMO

## 2017-03-18 DIAGNOSIS — D72820 Lymphocytosis (symptomatic): Secondary | ICD-10-CM | POA: Diagnosis not present

## 2017-03-18 DIAGNOSIS — D72829 Elevated white blood cell count, unspecified: Secondary | ICD-10-CM | POA: Diagnosis not present

## 2017-03-19 LAB — CBC WITH DIFFERENTIAL/PLATELET
Basophils Absolute: 93 cells/uL (ref 0–200)
Basophils Relative: 3 %
Eosinophils Absolute: 31 cells/uL (ref 15–500)
Eosinophils Relative: 1 %
HCT: 41.2 % (ref 38.5–50.0)
Hemoglobin: 14 g/dL (ref 13.0–17.0)
Lymphocytes Relative: 35 %
Lymphs Abs: 1085 cells/uL (ref 850–3900)
MCH: 33.4 pg — ABNORMAL HIGH (ref 27.0–33.0)
MCHC: 34 g/dL (ref 32.0–36.0)
MCV: 98.3 fL (ref 80.0–100.0)
MPV: 8.8 fL (ref 7.5–12.5)
Monocytes Absolute: 558 cells/uL (ref 200–950)
Monocytes Relative: 18 %
Neutro Abs: 1333 cells/uL — ABNORMAL LOW (ref 1500–7800)
Neutrophils Relative %: 43 %
Platelets: 328 10*3/uL (ref 140–400)
RBC: 4.19 MIL/uL — ABNORMAL LOW (ref 4.20–5.80)
RDW: 13.4 % (ref 11.0–15.0)
WBC: 3.1 10*3/uL — ABNORMAL LOW (ref 3.8–10.8)

## 2017-03-19 LAB — PATHOLOGIST SMEAR REVIEW

## 2017-03-20 ENCOUNTER — Other Ambulatory Visit: Payer: Self-pay | Admitting: Family Medicine

## 2017-03-26 ENCOUNTER — Other Ambulatory Visit: Payer: Self-pay | Admitting: *Deleted

## 2017-03-26 DIAGNOSIS — D709 Neutropenia, unspecified: Secondary | ICD-10-CM

## 2017-03-26 DIAGNOSIS — D72819 Decreased white blood cell count, unspecified: Secondary | ICD-10-CM

## 2017-05-03 ENCOUNTER — Telehealth: Payer: Self-pay | Admitting: Family Medicine

## 2017-05-03 NOTE — Telephone Encounter (Signed)
Pt said to call you when needed more Symbicort.

## 2017-05-03 NOTE — Telephone Encounter (Signed)
Samples placed at front desk for patient.  

## 2017-05-04 ENCOUNTER — Encounter (HOSPITAL_COMMUNITY): Payer: Medicare HMO

## 2017-05-04 ENCOUNTER — Encounter (HOSPITAL_COMMUNITY): Payer: Medicare HMO | Attending: Oncology | Admitting: Oncology

## 2017-05-04 ENCOUNTER — Ambulatory Visit (HOSPITAL_COMMUNITY)
Admission: RE | Admit: 2017-05-04 | Discharge: 2017-05-04 | Disposition: A | Discharge status: Home / Self Care | Payer: Medicare HMO | Source: Ambulatory Visit | Attending: Oncology | Admitting: Oncology

## 2017-05-04 DIAGNOSIS — D708 Other neutropenia: Secondary | ICD-10-CM | POA: Insufficient documentation

## 2017-05-04 DIAGNOSIS — F17200 Nicotine dependence, unspecified, uncomplicated: Secondary | ICD-10-CM

## 2017-05-04 DIAGNOSIS — D72819 Decreased white blood cell count, unspecified: Secondary | ICD-10-CM | POA: Diagnosis not present

## 2017-05-04 DIAGNOSIS — Z7289 Other problems related to lifestyle: Secondary | ICD-10-CM

## 2017-05-04 LAB — COMPREHENSIVE METABOLIC PANEL
ALT: 13 U/L — ABNORMAL LOW (ref 17–63)
AST: 25 U/L (ref 15–41)
Albumin: 4.2 g/dL (ref 3.5–5.0)
Alkaline Phosphatase: 73 U/L (ref 38–126)
Anion gap: 11 (ref 5–15)
BUN: 5 mg/dL — ABNORMAL LOW (ref 6–20)
CO2: 29 mmol/L (ref 22–32)
Calcium: 9.4 mg/dL (ref 8.9–10.3)
Chloride: 95 mmol/L — ABNORMAL LOW (ref 101–111)
Creatinine, Ser: 0.74 mg/dL (ref 0.61–1.24)
GFR calc Af Amer: >60 mL/min (ref >60)
GFR calc non Af Amer: >60 mL/min (ref >60)
Glucose, Bld: 103 mg/dL — ABNORMAL HIGH (ref 65–99)
Potassium: 3.5 mmol/L (ref 3.5–5.1)
Sodium: 135 mmol/L (ref 135–145)
Total Bilirubin: 0.6 mg/dL (ref 0.3–1.2)
Total Protein: 8 g/dL (ref 6.5–8.1)

## 2017-05-04 LAB — CBC WITH DIFFERENTIAL/PLATELET
Basophils Absolute: 0.1 10*3/uL (ref 0.0–0.1)
Basophils Relative: 2 %
Eosinophils Absolute: 0 10*3/uL (ref 0.0–0.7)
Eosinophils Relative: 1 %
HCT: 41 % (ref 39.0–52.0)
Hemoglobin: 14.5 g/dL (ref 13.0–17.0)
Lymphocytes Relative: 32 %
Lymphs Abs: 1.1 10*3/uL (ref 0.7–4.0)
MCH: 33.8 pg (ref 26.0–34.0)
MCHC: 35.4 g/dL (ref 30.0–36.0)
MCV: 95.6 fL (ref 78.0–100.0)
Monocytes Absolute: 0.3 10*3/uL (ref 0.1–1.0)
Monocytes Relative: 8 %
Neutro Abs: 1.9 10*3/uL (ref 1.7–7.7)
Neutrophils Relative %: 57 %
Platelets: 385 10*3/uL (ref 150–400)
RBC: 4.29 MIL/uL (ref 4.22–5.81)
RDW: 13.4 % (ref 11.5–15.5)
WBC: 3.3 10*3/uL — ABNORMAL LOW (ref 4.0–10.5)

## 2017-05-04 LAB — VITAMIN B12: Vitamin B-12: 357 pg/mL (ref 180–914)

## 2017-05-04 LAB — FOLATE: Folate: 13.3 ng/mL (ref >5.9)

## 2017-05-04 NOTE — Progress Notes (Signed)
[facility] Initial Visit:  Patient Care Team: [facility], Modena Nunnery, MD as PCP - General (Family Medicine)  CHIEF COMPLAINTS/PURPOSE OF CONSULTATION:  Leukopenia  HISTORY OF PRESENTING ILLNESS: Zachary Chambers 66 y.o. male presents today for evaluation of neutropenia. He had blood work performed on 03/02/2017 which demonstrated WBC 3.1K, hemoglobin 13.8 g/dL, hematocrit 40.6%, plate count 035D, differential demonstrated an [place] 1457 otherwise differential was normal. Repeat CBC on 03/18/17 demonstrated WBC 3.1K, hemoglobin14 g/dL, hematocrit 41.2%, platelet count 328K, differential demonstrated an [place] 1333, otherwise differential normal. Baseline WBC from 07/17/2015 was 4.6K. WBC from 02/13/2015 was 3.7K. 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  Rare atypical lymphs. No immature cells are identified. Normocytic/Normochromic anemia with unremarkable RBC morphology. Platelets are unremarkable.   Patient is completely asymptomatic. He denies any recurrent or chronic infections. He denies any chest pain, shortness breath, abdominal pain, focal weakness. He denies any sinus infections, nausea, vomiting, diarrhea. Patient denies any risk factors for HIV or hepatitis. He states that he currently drinks about 6 beers a day but has cut back significantly in the past few years from prior when he used to drink 12 beers a day for decades. He is a chronic cigarette smoker.  Review of Systems - Oncology ROS as per HPI otherwise 12 point ROS is negative.  MEDICAL HISTORY: Past Medical History:  Diagnosis Date  . COPD (chronic obstructive pulmonary disease) ([place])   . DJD (degenerative joint disease)    diffusely  . GERD (gastroesophageal reflux disease)   . Hyperlipidemia   . MI (myocardial infarction) ([place]) 2006   small MI and stents x 3  Dr. Doreatha Lew  . PUD  (peptic ulcer disease)    with bleeding  . Rotator cuff disorder    has been evaluated by Dr Clifton James and Koleen Distance    SURGICAL HISTORY: Past Surgical History:  Procedure Laterality Date  . COLONOSCOPY    . EYE SURGERY     right eye removed at age 10  . heart stent     x3- 2006    SOCIAL HISTORY: Social History   Social History  . Marital status: Single    Spouse name: N/A  . Number of children: N/A  . Years of education: N/A   Occupational History  . Not on file.   Social History Main Topics  . Smoking status: Current Every Day Smoker    Packs/day: 1.00    Years: 40.00    Types: Cigarettes  . Smokeless tobacco: Never Used  . Alcohol use 18.0 oz/week    30 Cans of beer per week     Comment: carton of beer weekly  . Drug use: No  . Sexual activity: Yes   Other Topics Concern  . Not on file   Social History Narrative  . No narrative on file    FAMILY HISTORY Family History  Problem Relation Age of Onset  . Ovarian cancer Mother   . Diabetes Maternal Grandfather   . COPD Father   . Diabetes Paternal Grandfather   . Colon cancer Paternal Grandfather   . Esophageal cancer Neg Hx   . Rectal cancer Neg Hx   . Stomach cancer Neg Hx     ALLERGIES:  has No Known Allergies.  MEDICATIONS:  Current Outpatient Prescriptions  Medication Sig Dispense Refill  . aspirin 81 MG tablet Take 81 mg by mouth daily.      Marland Kitchen  budesonide-formoterol (SYMBICORT) 160-4.5 MCG/ACT inhaler Inhale 2 puffs into the lungs 2 (two) times daily. 1 Inhaler 12  . cyclobenzaprine (FLEXERIL) 5 MG tablet Take 1 tablet (5 mg total) by mouth 3 (three) times daily as needed for muscle spasms. 20 tablet 1  . fluticasone (FLONASE) 50 MCG/ACT nasal spray USE 2 SPRAYS IN EACH NOSTRIL EVERY DAY 16 g 6  . Glucosamine HCl (GLUCOSAMINE PO) Take 1,500 mg by mouth daily.    Marland Kitchen NITROSTAT 0.4 MG SL tablet dissolve 1 TABLET UNDER THE TONGUE every 5 minutes AS NEEDED CHEST PAIN 25 tablet 0  . omeprazole  (PRILOSEC) 40 MG capsule TAKE 1 CAPSULE BY MOUTH EVERY DAY 30 capsule 3  . simvastatin (ZOCOR) 40 MG tablet TAKE 1 TABLET BY MOUTH AT BEDTIME 30 tablet 3  . tiotropium (SPIRIVA) 18 MCG inhalation capsule Place 1 capsule (18 mcg total) into inhaler and inhale daily. 90 capsule 3  . traMADol (ULTRAM) 50 MG tablet Take 1 tablet (50 mg total) by mouth every 6 (six) hours as needed for moderate pain. 30 tablet 0  . VENTOLIN HFA 108 (90 Base) MCG/ACT inhaler INHALE 2 PUFFS into THE lungs EVERY 6 HOURS AS NEEDED FOR WHEEZING OR SHORTNESS OF BREATH 18 g 3   No current facility-administered medications for this visit.     PHYSICAL EXAMINATION:  ECOG PERFORMANCE STATUS: 0 - Asymptomatic   Vitals:   05/04/17 1253  BP: 132/64  Pulse: 88  Resp: 18  SpO2: 95%    Filed Weights   05/04/17 1253  Weight: 142 lb (64.4 kg)     Physical Exam Constitutional: Well-developed, well-nourished, and in no distress.   HENT:  Head: Normocephalic and atraumatic.  Mouth/Throat: No oropharyngeal exudate. Mucosa moist. Eyes: Pupils are equal, round, and reactive to light. Conjunctivae are normal. No scleral icterus.  Neck: Normal range of motion. Neck supple. No JVD present.  Cardiovascular: Normal rate, regular rhythm and normal heart sounds.  Exam reveals no gallop and no friction rub.   No murmur heard. Pulmonary/Chest: Effort normal and breath sounds normal. No respiratory distress. No wheezes.No rales.  Abdominal: Soft. Bowel sounds are normal. No distension. There is no tenderness. There is no guarding.  Musculoskeletal: No edema or tenderness.  Lymphadenopathy:    No cervical or supraclavicular adenopathy.  Neurological: Alert and oriented to person, place, and time. No cranial nerve deficit.  Skin: Skin is warm and dry. No rash noted. No erythema. No pallor.  Psychiatric: Affect and judgment normal.   LABORATORY DATA: I have personally reviewed the data as listed:  No visits with results  within 1 Month(s) from this visit.  Latest known visit with results is:  Lab on 03/18/2017  Component Date Value Ref Range Status  . WBC 03/18/2017 3.1* 3.8 - 10.8 K/uL Final   Few atypical lymphocytes noted.  Marland Kitchen RBC 03/18/2017 4.19* 4.20 - 5.80 MIL/uL Final  . Hemoglobin 03/18/2017 14.0  13.0 - 17.0 g/dL Final  . HCT 03/18/2017 41.2  38.5 - 50.0 % Final  . MCV 03/18/2017 98.3  80.0 - 100.0 fL Final  . MCH 03/18/2017 33.4* 27.0 - 33.0 pg Final  . MCHC 03/18/2017 34.0  32.0 - 36.0 g/dL Final  . RDW 03/18/2017 13.4  11.0 - 15.0 % Final  . Platelets 03/18/2017 328  140 - 400 K/uL Final  . MPV 03/18/2017 8.8  7.5 - 12.5 fL Final  . Neutro Abs 03/18/2017 1333* 1,500 - 7,800 cells/uL Final  . Lymphs Abs 03/18/2017 1085  850 - 3,900 cells/uL Final  . Monocytes Absolute 03/18/2017 558  200 - 950 cells/uL Final  . Eosinophils Absolute 03/18/2017 31  15 - 500 cells/uL Final  . Basophils Absolute 03/18/2017 93  0 - 200 cells/uL Final  . Neutrophils Relative % 03/18/2017 43  % Final  . Lymphocytes Relative 03/18/2017 35  % Final  . Monocytes Relative 03/18/2017 18  % Final  . Eosinophils Relative 03/18/2017 1  % Final  . Basophils Relative 03/18/2017 3  % Final  . Smear Review 03/18/2017 Criteria for review not met   Final  . 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  Rare atypical lymphs. No immature cells are identified.  Normocytic/Normochromic anemia with unremarkable RBC morphology. Platelets are unremarkable. Reviewed by Francis Gaines Mammarappallil MD (Electronic Signature on File) 03/19/17     ASSESSMENT: Mild Leukopenia Chronic smoker Chronic alcohol use  PLAN: -Patient is asymptomatic and his Smolan is above 1000, he has enough WBCs to fight off infections. -Patient does endorse chronic alcohol use, I will get an [place] to r/o splenomegaly as a cause of his  leukopenia. -Otherwise I will perform a full leukopenia workup with labs as stated below.  -RTC in 3 weeks to review results.  Orders Placed This Encounter  Procedures  . US Abdomen Complete    Standing Status:   Future    Number of Occurrences:   1    Standing Expiration Date:   05/04/2018    Order Specific Question:   Reason for Exam (SYMPTOM  OR DIAGNOSIS REQUIRED)    Answer:   leukopenia rule out hepatosplenomegaly    Order Specific Question:   Preferred imaging location?    Answer:   [facility]    Order Specific Question:   Call Results- Best Contact Number?    Answer:   [phone], no hold  . CBC with Differential    Standing Status:   Future    Standing Expiration Date:   05/04/2018  . Comprehensive metabolic panel    Standing Status:   Future    Standing Expiration Date:   05/04/2018  . HIV antibody (with reflex)  . Hepatitis panel, acute    Standing Status:   Future    Standing Expiration Date:   05/04/2018  . Vitamin B12    Standing Status:   Future    Standing Expiration Date:   05/04/2018  . Folate    Standing Status:   Future    Standing Expiration Date:   05/04/2018  . Multiple Myeloma Panel (SPEP&IFE w/QIG)    Standing Status:   Future    Standing Expiration Date:   05/04/2018  . Copper, serum    Standing Status:   Future    Standing Expiration Date:   05/04/2018    All questions were answered. The patient knows to call the clinic with any problems, questions or concerns.  This note was electronically signed.    Twana First, MD  05/04/2017 2:05 PM

## 2017-05-05 LAB — HEPATITIS PANEL, ACUTE
HCV Ab: <0.1 s/co ratio (ref 0.0–0.9)
Hep A IgM: NEGATIVE
Hep B C IgM: NEGATIVE
Hepatitis B Surface Ag: NEGATIVE

## 2017-05-05 LAB — MULTIPLE MYELOMA PANEL, SERUM
Albumin SerPl Elph-Mcnc: 3.6 g/dL (ref 2.9–4.4)
Albumin/Glob SerPl: 1 (ref 0.7–1.7)
Alpha 1: 0.4 g/dL (ref 0.0–0.4)
Alpha2 Glob SerPl Elph-Mcnc: 0.9 g/dL (ref 0.4–1.0)
B-Globulin SerPl Elph-Mcnc: 1.1 g/dL (ref 0.7–1.3)
Gamma Glob SerPl Elph-Mcnc: 1.4 g/dL (ref 0.4–1.8)
Globulin, Total: 3.7 g/dL (ref 2.2–3.9)
IgA: 394 mg/dL (ref 61–437)
IgG (Immunoglobin G), Serum: 1227 mg/dL (ref 700–1600)
IgM (Immunoglobulin M), Srm: 148 mg/dL (ref 20–172)
Total Protein ELP: 7.3 g/dL (ref 6.0–8.5)

## 2017-05-05 LAB — HIV ANTIBODY (ROUTINE TESTING W REFLEX): HIV Screen 4th Generation wRfx: NONREACTIVE

## 2017-05-06 LAB — COPPER, SERUM: Copper: 136 ug/dL (ref 72–166)

## 2017-05-24 ENCOUNTER — Other Ambulatory Visit (HOSPITAL_COMMUNITY): Payer: Self-pay | Admitting: *Deleted

## 2017-05-25 ENCOUNTER — Encounter (HOSPITAL_COMMUNITY): Payer: Medicare HMO | Attending: Oncology | Admitting: Oncology

## 2017-05-25 ENCOUNTER — Encounter (HOSPITAL_COMMUNITY): Payer: Self-pay | Admitting: Oncology

## 2017-05-25 ENCOUNTER — Other Ambulatory Visit (HOSPITAL_COMMUNITY): Payer: Medicare HMO

## 2017-05-25 VITALS — BP 130/65 | HR 76 | Temp 97.7°F | Resp 20 | Ht 68.0 in | Wt 139.0 lb

## 2017-05-25 DIAGNOSIS — F17200 Nicotine dependence, unspecified, uncomplicated: Secondary | ICD-10-CM

## 2017-05-25 DIAGNOSIS — D708 Other neutropenia: Secondary | ICD-10-CM | POA: Insufficient documentation

## 2017-05-25 DIAGNOSIS — Z7289 Other problems related to lifestyle: Secondary | ICD-10-CM

## 2017-05-25 NOTE — Progress Notes (Signed)
[place] [facility] Initial Visit:  Patient Care Team: [facility], Modena Nunnery, MD as PCP - General (Family Medicine)  CHIEF COMPLAINTS/PURPOSE OF CONSULTATION:  Leukopenia  HISTORY OF PRESENTING ILLNESS: Zachary Chambers 66 y.o. male presents today for evaluation of neutropenia. He had blood work performed on 03/02/2017 which demonstrated WBC 3.1K, hemoglobin 13.8 g/dL, hematocrit 40.6%, plate count 829H, differential demonstrated an [place] 1457 otherwise differential was normal. Repeat CBC on 03/18/17 demonstrated WBC 3.1K, hemoglobin14 g/dL, hematocrit 41.2%, platelet count 328K, differential demonstrated an [place] 1333, otherwise differential normal. Baseline WBC from 07/17/2015 was 4.6K. WBC from 02/13/2015 was 3.7K. 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  Rare atypical lymphs. No immature cells are identified. Normocytic/Normochromic anemia with unremarkable RBC morphology. Platelets are unremarkable.   Patient is completely asymptomatic. He denies any recurrent or chronic infections. He denies any chest pain, shortness breath, abdominal pain, focal weakness. He denies any sinus infections, nausea, vomiting, diarrhea. Patient denies any risk factors for HIV or hepatitis. He states that he currently drinks about 6 beers a day but has cut back significantly in the past few years from prior when he used to drink 12 beers a day for decades. He is a chronic cigarette smoker.  INTERVAL HISTORY: Patient presents today for continued follow-up for leukopenia and review his lab workup performed on 05/04/2017. CBC on 05/04/2017 demonstrated WBC 3.3K, hemoglobin 14.5 g/dL, hematocrit 41%, plate count 371I, differential was normal with an absolute lymphocyte count of 1.1K, absolute neutrophil count 1.9K. HIV and a acute hepatitis panel was negative. Folate, vitamin B12, serum  copper were within normal limits. Immunofixation demonstrated no M spike; an apparent polyclonal gammopathy: IgA. Kappa and lambda typing appear increased.   Patient states that since his last visit, he had gone the flu from his grandchild and was sick with myalgia and chills and productive cough with white sputum for 3 days. Currently today is the second day that he feels better. He denies any fevers. He denies any chest pain, shortness breath, abdominal pain, focal weakness.   Review of Systems - Oncology ROS as per HPI otherwise 12 point ROS is negative.  MEDICAL HISTORY: Past Medical History:  Diagnosis Date  . COPD (chronic obstructive pulmonary disease) ([place])   . DJD (degenerative joint disease)    diffusely  . GERD (gastroesophageal reflux disease)   . Hyperlipidemia   . MI (myocardial infarction) ([place]) 2006   small MI and stents x 3  Dr. Doreatha Lew  . PUD (peptic ulcer disease)    with bleeding  . Rotator cuff disorder    has been evaluated by Dr Clifton James and Koleen Distance    SURGICAL HISTORY: Past Surgical History:  Procedure Laterality Date  . COLONOSCOPY    . EYE SURGERY     right eye removed at age 76  . heart stent     x3- 2006    SOCIAL HISTORY: Social History   Social History  . Marital status: Single    Spouse name: N/A  . Number of children: N/A  . Years of education: N/A   Occupational History  . Not on file.   Social History Main Topics  . Smoking status: Current Every Day Smoker    Packs/day: 1.00    Years: 40.00    Types: Cigarettes  . Smokeless tobacco: Never Used  . Alcohol use 18.0 oz/week    30 Cans of beer  per week     Comment: carton of beer weekly  . Drug use: No  . Sexual activity: Yes   Other Topics Concern  . Not on file   Social History Narrative  . No narrative on file    FAMILY HISTORY Family History  Problem Relation Age of Onset  . Ovarian cancer Mother   . Diabetes Maternal Grandfather   . COPD Father   . Diabetes  Paternal Grandfather   . Colon cancer Paternal Grandfather   . Esophageal cancer Neg Hx   . Rectal cancer Neg Hx   . Stomach cancer Neg Hx     ALLERGIES:  has No Known Allergies.  MEDICATIONS:  Current Outpatient Prescriptions  Medication Sig Dispense Refill  . aspirin 81 MG tablet Take 81 mg by mouth daily.      . budesonide-formoterol (SYMBICORT) 160-4.5 MCG/ACT inhaler Inhale 2 puffs into the lungs 2 (two) times daily. 1 Inhaler 12  . cyclobenzaprine (FLEXERIL) 5 MG tablet Take 1 tablet (5 mg total) by mouth 3 (three) times daily as needed for muscle spasms. 20 tablet 1  . fluticasone (FLONASE) 50 MCG/ACT nasal spray USE 2 SPRAYS IN EACH NOSTRIL EVERY DAY 16 g 6  . Glucosamine HCl (GLUCOSAMINE PO) Take 1,500 mg by mouth daily.    Marland Kitchen NITROSTAT 0.4 MG SL tablet dissolve 1 TABLET UNDER THE TONGUE every 5 minutes AS NEEDED CHEST PAIN 25 tablet 0  . omeprazole (PRILOSEC) 40 MG capsule TAKE 1 CAPSULE BY MOUTH EVERY DAY 30 capsule 3  . simvastatin (ZOCOR) 40 MG tablet TAKE 1 TABLET BY MOUTH AT BEDTIME 30 tablet 3  . tiotropium (SPIRIVA) 18 MCG inhalation capsule Place 1 capsule (18 mcg total) into inhaler and inhale daily. 90 capsule 3  . traMADol (ULTRAM) 50 MG tablet Take 1 tablet (50 mg total) by mouth every 6 (six) hours as needed for moderate pain. 30 tablet 0  . VENTOLIN HFA 108 (90 Base) MCG/ACT inhaler INHALE 2 PUFFS into THE lungs EVERY 6 HOURS AS NEEDED FOR WHEEZING OR SHORTNESS OF BREATH 18 g 3   No current facility-administered medications for this visit.     PHYSICAL EXAMINATION:  ECOG PERFORMANCE STATUS: 0 - Asymptomatic   Vitals:   05/25/17 1118  BP: 130/65  Pulse: 76  Resp: 20  Temp: 97.7 F (36.5 C)  SpO2: 95%    Filed Weights   05/25/17 1118  Weight: 139 lb (63 kg)     Physical Exam Constitutional: Well-developed, well-nourished, and in no distress.   HENT:  Head: Normocephalic and atraumatic.  Mouth/Throat: No oropharyngeal exudate. Mucosa  moist. Eyes: Pupils are equal, round, and reactive to light. Conjunctivae are normal. No scleral icterus.  Neck: Normal range of motion. Neck supple. No JVD present.  Cardiovascular: Normal rate, regular rhythm and normal heart sounds.  Exam reveals no gallop and no friction rub.   No murmur heard. Pulmonary/Chest: Effort normal and breath sounds normal. No respiratory distress. No wheezes.No rales.  Abdominal: Soft. Bowel sounds are normal. No distension. There is no tenderness. There is no guarding.  Musculoskeletal: No edema or tenderness.  Lymphadenopathy:    No cervical or supraclavicular adenopathy.  Neurological: Alert and oriented to person, place, and time. No cranial nerve deficit.  Skin: Skin is warm and dry. No rash noted. No erythema. No pallor.  Psychiatric: Affect and judgment normal.   LABORATORY DATA: I have personally reviewed the data as listed:  Appointment on 05/04/2017  Component Date Value Ref  Range Status  . WBC 05/04/2017 3.3* 4.0 - 10.5 K/uL Final  . RBC 05/04/2017 4.29  4.22 - 5.81 MIL/uL Final  . Hemoglobin 05/04/2017 14.5  13.0 - 17.0 g/dL Final  . HCT 05/04/2017 41.0  39.0 - 52.0 % Final  . MCV 05/04/2017 95.6  78.0 - 100.0 fL Final  . MCH 05/04/2017 33.8  26.0 - 34.0 pg Final  . MCHC 05/04/2017 35.4  30.0 - 36.0 g/dL Final  . RDW 05/04/2017 13.4  11.5 - 15.5 % Final  . Platelets 05/04/2017 385  150 - 400 K/uL Final  . Neutrophils Relative % 05/04/2017 57  % Final  . Neutro Abs 05/04/2017 1.9  1.7 - 7.7 K/uL Final  . Lymphocytes Relative 05/04/2017 32  % Final  . Lymphs Abs 05/04/2017 1.1  0.7 - 4.0 K/uL Final  . Monocytes Relative 05/04/2017 8  % Final  . Monocytes Absolute 05/04/2017 0.3  0.1 - 1.0 K/uL Final  . Eosinophils Relative 05/04/2017 1  % Final  . Eosinophils Absolute 05/04/2017 0.0  0.0 - 0.7 K/uL Final  . Basophils Relative 05/04/2017 2  % Final  . Basophils Absolute 05/04/2017 0.1  0.0 - 0.1 K/uL Final  . Sodium 05/04/2017 135  135 -  145 mmol/L Final  . Potassium 05/04/2017 3.5  3.5 - 5.1 mmol/L Final  . Chloride 05/04/2017 95* 101 - 111 mmol/L Final  . CO2 05/04/2017 29  22 - 32 mmol/L Final  . Glucose, Bld 05/04/2017 103* 65 - 99 mg/dL Final  . BUN 05/04/2017 5* 6 - 20 mg/dL Final  . Creatinine, Ser 05/04/2017 0.74  0.61 - 1.24 mg/dL Final  . Calcium 05/04/2017 9.4  8.9 - 10.3 mg/dL Final  . Total Protein 05/04/2017 8.0  6.5 - 8.1 g/dL Final  . Albumin 05/04/2017 4.2  3.5 - 5.0 g/dL Final  . AST 05/04/2017 25  15 - 41 U/L Final  . ALT 05/04/2017 13* 17 - 63 U/L Final  . Alkaline Phosphatase 05/04/2017 73  38 - 126 U/L Final  . Total Bilirubin 05/04/2017 0.6  0.3 - 1.2 mg/dL Final  . GFR calc non Af Amer 05/04/2017 >60  >60 mL/min Final  . GFR calc Af Amer 05/04/2017 >60  >60 mL/min Final   Comment: (NOTE) The eGFR has been calculated using the CKD EPI equation. This calculation has not been validated in all clinical situations. eGFR's persistently <60 mL/min signify possible Chronic Kidney Disease.   . Anion gap 05/04/2017 11  5 - 15 Final  . Hepatitis B Surface Ag 05/04/2017 Negative  Negative Final  . HCV Ab 05/04/2017 <0.1  0.0 - 0.9 s/co ratio Final   Comment: (NOTE)                                  Negative:     < 0.8                             Indeterminate: 0.8 - 0.9                                  Positive:     > 0.9 The CDC recommends that a positive HCV antibody result be followed up with a HCV Nucleic Acid Amplification test (008676). Performed At: [facility] [address]  [place], [place] [id number] Lindon Romp MD [id number]:[id number]   . Hep A IgM 05/04/2017 Negative  Negative Final  . Hep B C IgM 05/04/2017 Negative  Negative Final  . Vitamin B-12 05/04/2017 357  180 - 914 pg/mL Final   Comment: (NOTE) This assay is not validated for testing neonatal or myeloproliferative syndrome specimens for Vitamin B12 levels. Performed at [facility], [place] [address],  [place], [place] [address]   . Folate 05/04/2017 13.3  >5.9 ng/mL Final   Performed at [facility], [place] [address], [place], [place] [address]  . IgG (Immunoglobin G), Serum 05/04/2017 1227  700 - 1,600 mg/dL Final  . IgA 05/04/2017 394  61 - 437 mg/dL Final  . IgM (Immunoglobulin M), Srm 05/04/2017 148  20 - 172 mg/dL Final  . Total Protein ELP 05/04/2017 7.3  6.0 - 8.5 g/dL Corrected  . Albumin SerPl Elph-Mcnc 05/04/2017 3.6  2.9 - 4.4 g/dL Corrected  . Alpha 1 05/04/2017 0.4  0.0 - 0.4 g/dL Corrected  . Alpha2 Glob SerPl Elph-Mcnc 05/04/2017 0.9  0.4 - 1.0 g/dL Corrected  . B-Globulin SerPl Elph-Mcnc 05/04/2017 1.1  0.7 - 1.3 g/dL Corrected  . Gamma Glob SerPl Elph-Mcnc 05/04/2017 1.4  0.4 - 1.8 g/dL Corrected  . M Protein SerPl Elph-Mcnc 05/04/2017 Not Observed  Not Observed g/dL Corrected  . Globulin, Total 05/04/2017 3.7  2.2 - 3.9 g/dL Corrected  . Albumin/Glob SerPl 05/04/2017 1.0  0.7 - 1.7 Corrected  . IFE 1 05/04/2017 Comment   Corrected   Comment: (NOTE) An apparent polyclonal gammopathy: IgA. Kappa and lambda typing appear increased.   . Please Note 05/04/2017 Comment   Corrected   Comment: (NOTE) Protein electrophoresis scan will follow via computer, mail, or courier delivery. Performed At: [facility] [place], [place] [id number] Lindon Romp MD OF:[id number]   . Copper 05/04/2017 136  72 - 166 ug/dL Final   Comment: (NOTE)                                Detection Limit = 5 Performed At: [facility] [place], [place] [id number] Lindon Romp MD [id number]:[id number]   Office Visit on 05/04/2017  Component Date Value Ref Range Status  . HIV Screen 4th Generation wRfx 05/04/2017 Non Reactive  Non Reactive Final   Comment: (NOTE) Performed At: [facility] [place], [place] [id number] Lindon Romp MD ZT:[id number]     ASSESSMENT: Mild Leukopenia Chronic smoker Chronic alcohol  use  PLAN: -Patient is asymptomatic. Leukopenia is stable and mild. Normal ANC on his last CBC. -Abd [place] on 05/04/17 was negative for splenomegaly. Leukopenia workup labs reviewed with the patient, no cause found. -Will plan to continue observation of his CBC at this time. -RTC in 6 months to review results. If stability of his mild neutropenia is demonstrated over time; then we can consider turning him back over to PCP for routine monitoring.  Orders Placed This Encounter  Procedures  . Comprehensive metabolic panel    Standing Status:   Future    Standing Expiration Date:   05/25/2018  . CBC with Differential    Standing Status:   Future    Standing Expiration Date:   05/25/2018    All questions were answered. The patient knows to call the clinic with any problems, questions or concerns.  This note was electronically signed.  Twana First, MD  05/25/2017 1:13 PM

## 2017-07-12 ENCOUNTER — Other Ambulatory Visit: Payer: Self-pay | Admitting: *Deleted

## 2017-07-12 DIAGNOSIS — J418 Mixed simple and mucopurulent chronic bronchitis: Secondary | ICD-10-CM

## 2017-07-12 MED ORDER — TIOTROPIUM BROMIDE MONOHYDRATE 18 MCG IN CAPS: 18.0000 ug | ORAL_CAPSULE | Freq: Every day | RESPIRATORY_TRACT | 3 refills | Status: DC

## 2017-07-12 MED ORDER — OMEPRAZOLE 40 MG PO CPDR: DELAYED_RELEASE_CAPSULE | ORAL | 3 refills | Status: DC

## 2017-07-29 ENCOUNTER — Other Ambulatory Visit: Payer: Self-pay | Admitting: Family Medicine

## 2017-08-17 NOTE — Progress Notes (Deleted)
Patient ID: Zachary Chambers, male   DOB: 1951-01-21, 67 y.o.   MRN: [id number]   66 y.o.  previously seen by Dr Doreatha Lew.  He has had infrequent medical F/U due to lack of insurance. Marland Kitchen CAD with stent in circ and latest cath 2003 with patent circ stent and new stent placed to LAD. Previous angina a cramping pain in chest. Has not had any of this. Smokes 1-2 ppd. Lung cancer screening CT June 2018 ok Counselled for less than 10 minutes on smoking cessation. Not motivated to quit. Sees [facility] for pulmonary  Active doing yard work. No palpitations, PND orthopnea. EF normal in past  Lab Results  Component Value Date   LDLCALC 36 03/02/2017     Carotids 08/24/2016  reviewed with midl plaque no significant stenosis   June see by me and had cellulitis started on Augmentin and referred to ID Also noted mild leukopenia WBC stable around 3.3   ***  ROS: Denies fever, malais, weight loss, blurry vision, decreased visual acuity, cough, sputum, SOB, hemoptysis, pleuritic pain, palpitaitons, heartburn, abdominal pain, melena, lower extremity edema, claudication, or rash.  All other systems reviewed and negative  General: Affect appropriate Chronically ill male  HEENT: normal Neck supple with no adenopathy JVP normal no bruits no thyromegaly Lungs exp wheezing and good diaphragmatic motion Heart:  S1/S2 no murmur, no rub, gallop or click PMI normal Abdomen: benighn, BS positve, no tenderness, no AAA no bruit.  No HSM or HJR Distal pulses intact with no bruits No edema Neuro non-focal Skin chronic bruising both arms  No muscular weakness    Current Outpatient Medications  Medication Sig Dispense Refill  . aspirin 81 MG tablet Take 81 mg by mouth daily.      . budesonide-formoterol (SYMBICORT) 160-4.5 MCG/ACT inhaler Inhale 2 puffs into the lungs 2 (two) times daily. 1 Inhaler 12  . cyclobenzaprine (FLEXERIL) 5 MG tablet Take 1 tablet (5 mg total) by mouth 3 (three) times daily as needed for muscle  spasms. 20 tablet 1  . fluticasone (FLONASE) 50 MCG/ACT nasal spray USE 2 SPRAYS IN EACH NOSTRIL EVERY DAY 16 g 6  . Glucosamine HCl (GLUCOSAMINE PO) Take 1,500 mg by mouth daily.    Marland Kitchen NITROSTAT 0.4 MG SL tablet dissolve 1 TABLET UNDER THE TONGUE every 5 minutes AS NEEDED CHEST PAIN 25 tablet 0  . omeprazole (PRILOSEC) 40 MG capsule TAKE 1 CAPSULE BY MOUTH EVERY DAY 90 capsule 3  . simvastatin (ZOCOR) 40 MG tablet TAKE 1 TABLET BY MOUTH AT BEDTIME 30 tablet 3  . tiotropium (SPIRIVA) 18 MCG inhalation capsule Place 1 capsule (18 mcg total) into inhaler and inhale daily. 90 capsule 3  . traMADol (ULTRAM) 50 MG tablet Take 1 tablet (50 mg total) by mouth every 6 (six) hours as needed for moderate pain. 30 tablet 0  . VENTOLIN HFA 108 (90 Base) MCG/ACT inhaler INHALE 2 PUFFS into THE lungs EVERY 6 HOURS AS NEEDED FOR WHEEZING OR SHORTNESS OF BREATH 18 g 3   No current facility-administered medications for this visit.     Allergies  Patient has no known allergies.  Electrocardiogram:  03/04/15  SR rate 87 normal ECG  10/28/15  SR rate 70 artifact nonspecific ST/T wave changes    Assessment and Plan  CAD: Stable with no angina and good activity level.  Continue medical Rx Stent to LAD/Circumflex last 2003   Smoking: COPD f/u with [facility] lung cancer screening CT June 2019 moderate emphysema  Bruit:  Plaque no stenosis f/u duplex 2020   AAA:  Palpable  Abdominal aorta no AAA by duplex reviewed 08/24/16 Chol:  Cholesterol is at goal.  Continue current dose of statin and diet Rx.  No myalgias or side effects.  F/U  LFT's in 6 months. Lab Results  Component Value Date   LDLCALC 36 03/02/2017   GERD:  Refill on prlosec called in low carb diet   Leukopenia:  Stable 3.3 no signs splenomegaly or cancer f/u Hematology [facility]        F/U with me in 6 months   [name]

## 2017-08-23 ENCOUNTER — Encounter (HOSPITAL_COMMUNITY): Payer: Commercial Managed Care - HMO

## 2017-08-23 ENCOUNTER — Other Ambulatory Visit (HOSPITAL_COMMUNITY): Payer: Commercial Managed Care - HMO

## 2017-08-25 ENCOUNTER — Ambulatory Visit: Payer: Medicare HMO | Admitting: Cardiovascular Disease

## 2017-08-25 NOTE — Progress Notes (Signed)
Patient ID: Zachary Chambers, male   DOB: 1950-12-24, 67 y.o.   MRN: [id number]   66 y.o.  previously seen by Dr Doreatha Lew.  He has had infrequent medical F/U due to lack of insurance. Marland Kitchen CAD with stent in circ and latest cath 2003 with patent circ stent and new stent placed to LAD. Previous angina a cramping pain in chest. Has not had any of this. Smokes 1-2 ppd. Lung cancer screening CT June 2018 ok Counselled for less than 10 minutes on smoking cessation. Not motivated to quit. Sees [facility] for pulmonary  Active doing yard work. No palpitations, PND orthopnea. EF normal in past  Lab Results  Component Value Date   LDLCALC 36 03/02/2017     Carotids 08/24/2016  reviewed with midl plaque no significant stenosis   June see by me and had cellulitis started on Augmentin and referred to ID Also noted mild leukopenia WBC stable around 3.3    ROS: Denies fever, malais, weight loss, blurry vision, decreased visual acuity, cough, sputum, SOB, hemoptysis, pleuritic pain, palpitaitons, heartburn, abdominal pain, melena, lower extremity edema, claudication, or rash.  All other systems reviewed and negative  General: Affect appropriate Chronically ill male  HEENT: normal Neck supple with no adenopathy JVP normal no bruits no thyromegaly Lungs exp wheezing and good diaphragmatic motion Heart:  S1/S2 no murmur, no rub, gallop or click PMI normal Abdomen: benighn, BS positve, no tenderness, no AAA no bruit.  No HSM or HJR Distal pulses intact with no bruits No edema Neuro non-focal Skin chronic bruising both arms  No muscular weakness    Current Outpatient Medications  Medication Sig Dispense Refill  . aspirin 81 MG tablet Take 81 mg by mouth daily.      . budesonide-formoterol (SYMBICORT) 160-4.5 MCG/ACT inhaler Inhale 2 puffs into the lungs 2 (two) times daily. 1 Inhaler 12  . cyclobenzaprine (FLEXERIL) 5 MG tablet Take 1 tablet (5 mg total) by mouth 3 (three) times daily as needed for muscle spasms.  20 tablet 1  . fluticasone (FLONASE) 50 MCG/ACT nasal spray USE 2 SPRAYS IN EACH NOSTRIL EVERY DAY 16 g 6  . Glucosamine HCl (GLUCOSAMINE PO) Take 1,500 mg by mouth daily.    Marland Kitchen NITROSTAT 0.4 MG SL tablet dissolve 1 TABLET UNDER THE TONGUE every 5 minutes AS NEEDED CHEST PAIN 25 tablet 0  . omeprazole (PRILOSEC) 40 MG capsule TAKE 1 CAPSULE BY MOUTH EVERY DAY 90 capsule 3  . simvastatin (ZOCOR) 40 MG tablet TAKE 1 TABLET BY MOUTH AT BEDTIME 30 tablet 3  . tiotropium (SPIRIVA) 18 MCG inhalation capsule Place 1 capsule (18 mcg total) into inhaler and inhale daily. 90 capsule 3  . traMADol (ULTRAM) 50 MG tablet Take 1 tablet (50 mg total) by mouth every 6 (six) hours as needed for moderate pain. 30 tablet 0  . VENTOLIN HFA 108 (90 Base) MCG/ACT inhaler INHALE 2 PUFFS into THE lungs EVERY 6 HOURS AS NEEDED FOR WHEEZING OR SHORTNESS OF BREATH 18 g 3   No current facility-administered medications for this visit.     Allergies  Patient has no known allergies.  Electrocardiogram:  03/04/15  SR rate 87 normal ECG  10/28/15  SR rate 70 artifact nonspecific ST/T wave changes    Assessment and Plan  CAD: Stable with no angina and good activity level.  Continue medical Rx Stent to LAD/Circumflex last 2003   Smoking: COPD f/u with [facility] lung cancer screening CT June 2018 moderate emphysema   Bruit:  Plaque no stenosis f/u duplex 2020   AAA:  Palpable  Abdominal aorta no AAA by duplex reviewed 08/24/16 Chol:  Cholesterol is at goal.  Continue current dose of statin and diet Rx.  No myalgias or side effects.  F/U  LFT's in 6 months. Lab Results  Component Value Date   LDLCALC 36 03/02/2017   GERD:  Refill on prlosec called in low carb diet   Leukopenia:  Stable 3.3 no signs splenomegaly or cancer f/u [facility]        F/U with me in 6 months   [name]

## 2017-08-26 ENCOUNTER — Encounter: Payer: Self-pay | Admitting: Cardiovascular Disease

## 2017-08-26 ENCOUNTER — Ambulatory Visit: Payer: Medicare HMO | Admitting: Cardiovascular Disease

## 2017-08-26 VITALS — BP 122/70 | HR 71 | Ht 68.0 in | Wt 133.8 lb

## 2017-08-26 DIAGNOSIS — I2510 Atherosclerotic heart disease of native coronary artery without angina pectoris: Secondary | ICD-10-CM

## 2017-08-26 NOTE — Patient Instructions (Signed)

## 2017-09-03 ENCOUNTER — Ambulatory Visit (INDEPENDENT_AMBULATORY_CARE_PROVIDER_SITE_OTHER): Payer: Medicare HMO | Admitting: Family Medicine

## 2017-09-03 ENCOUNTER — Other Ambulatory Visit: Payer: Self-pay

## 2017-09-03 ENCOUNTER — Encounter: Payer: Self-pay | Admitting: Family Medicine

## 2017-09-03 VITALS — BP 128/72 | HR 82 | Temp 97.9°F | Resp 16 | Ht 68.0 in | Wt 131.0 lb

## 2017-09-03 DIAGNOSIS — Z23 Encounter for immunization: Secondary | ICD-10-CM | POA: Diagnosis not present

## 2017-09-03 DIAGNOSIS — J449 Chronic obstructive pulmonary disease, unspecified: Secondary | ICD-10-CM

## 2017-09-03 DIAGNOSIS — R7309 Other abnormal glucose: Secondary | ICD-10-CM | POA: Diagnosis not present

## 2017-09-03 DIAGNOSIS — I25110 Atherosclerotic heart disease of native coronary artery with unstable angina pectoris: Secondary | ICD-10-CM

## 2017-09-03 DIAGNOSIS — M150 Primary generalized (osteo)arthritis: Secondary | ICD-10-CM | POA: Diagnosis not present

## 2017-09-03 DIAGNOSIS — M8949 Other hypertrophic osteoarthropathy, multiple sites: Secondary | ICD-10-CM

## 2017-09-03 DIAGNOSIS — F17200 Nicotine dependence, unspecified, uncomplicated: Secondary | ICD-10-CM

## 2017-09-03 DIAGNOSIS — M159 Polyosteoarthritis, unspecified: Secondary | ICD-10-CM

## 2017-09-03 MED ORDER — SIMVASTATIN 40 MG PO TABS: 40.0000 mg | ORAL_TABLET | Freq: Every day | ORAL | 3 refills | Status: DC

## 2017-09-03 MED ORDER — TRAMADOL HCL 50 MG PO TABS: 50.0000 mg | ORAL_TABLET | Freq: Four times a day (QID) | ORAL | 0 refills | Status: DC | PRN

## 2017-09-03 NOTE — Progress Notes (Signed)
   Subjective:    Patient ID: Zachary Chambers, male    DOB: Jul 20, 1951, 67 y.o.   MRN: [id number]  Patient presents for Follow-up (is fasting)  CAD- recently seen by cardiology, no changes , no CP  COPD- ran out of symbicort 3 weeks ago , breathing is worse without the Symbicort he thought he would try staying off instead of calling us for samples.  He is still taking the Spiriva.  He has follow-up with his pulmonologist next week.  He is due for flu shot  OA generalized-rarely takes tramadol he would like a refill a prescription for this.  Tends to get worse during the wintertime he is also out they are splitting wood  Meds reviewed No new concerns  Review Of Systems:  GEN- denies fatigue, fever, weight loss,weakness, recent illness HEENT- denies eye drainage, change in vision, nasal discharge, CVS- denies chest pain, palpitations RESP- denies SOB, cough, wheeze ABD- denies N/V, change in stools, abd pain GU- denies dysuria, hematuria, dribbling, incontinence MSK-+ joint pain, muscle aches, injury Neuro- denies headache, dizziness, syncope, seizure activity       Objective:    BP 128/72   Pulse 82   Temp 97.9 F (36.6 C) (Oral)   Resp 16   Ht 5\' 8"  (1.727 m)   Wt 131 lb (59.4 kg)   SpO2 94%   BMI 19.92 kg/m  GEN- NAD, alert and oriented x3 HEENT- PERRL, EOMI, non injected sclera, pink conjunctiva, MMM, oropharynx clear Neck- Supple, no LAD CVS- RRR, no murmur,distant HS RESP-CTAB,distant, no wheee ABD-NABS,soft,NT,ND EXT- No edema Pulses- Radial  2+        Assessment & Plan:      Problem List Items Addressed This Visit      Unprioritized   TOBACCO ABUSE   Osteoarthritis    Ultram refilled, use for severe pain only      Relevant Medications   traMADol (ULTRAM) 50 MG tablet   COPD (chronic obstructive pulmonary disease) (HCC) - Primary    Restart symbicort,samples provided as he can not afford monthly Continue spiriva F/u pulmonary      CAD (coronary  artery disease)    reviwed cardiology note, BP stable Fasting labs today       Relevant Medications   simvastatin (ZOCOR) 40 MG tablet   Other Relevant Orders   CBC with Differential/Platelet   Comprehensive metabolic panel   Lipid panel    Other Visit Diagnoses    Need for prophylactic vaccination and inoculation against influenza       Relevant Orders   Flu vaccine HIGH DOSE PF (Fluzone High dose) (Completed)      Note: This dictation was prepared with Dragon dictation along with smaller phrase technology. Any transcriptional errors that result from this process are unintentional.

## 2017-09-03 NOTE — Assessment & Plan Note (Signed)
Ultram refilled, use for severe pain only

## 2017-09-03 NOTE — Patient Instructions (Signed)
F/U 6 months for PHYSICAL  

## 2017-09-03 NOTE — Assessment & Plan Note (Signed)
Restart symbicort,samples provided as he can not afford monthly Continue spiriva F/u pulmonary

## 2017-09-03 NOTE — Assessment & Plan Note (Signed)
reviwed cardiology note, BP stable Fasting labs today

## 2017-09-07 ENCOUNTER — Ambulatory Visit: Payer: Medicare HMO | Admitting: Emergency Medicine

## 2017-09-07 LAB — COMPREHENSIVE METABOLIC PANEL
AG Ratio: 1.3 (calc) (ref 1.0–2.5)
ALT: 7 U/L — ABNORMAL LOW (ref 9–46)
AST: 19 U/L (ref 10–35)
Albumin: 3.9 g/dL (ref 3.6–5.1)
Alkaline phosphatase (APISO): 69 U/L (ref 40–115)
BUN: 9 mg/dL (ref 7–25)
CO2: 29 mmol/L (ref 20–32)
Calcium: 8.9 mg/dL (ref 8.6–10.3)
Chloride: 93 mmol/L — ABNORMAL LOW (ref 98–110)
Creat: 0.91 mg/dL (ref 0.70–1.25)
Globulin: 3 g/dL (calc) (ref 1.9–3.7)
Glucose, Bld: 116 mg/dL — ABNORMAL HIGH (ref 65–99)
Potassium: 4.1 mmol/L (ref 3.5–5.3)
Sodium: 132 mmol/L — ABNORMAL LOW (ref 135–146)
Total Bilirubin: 0.5 mg/dL (ref 0.2–1.2)
Total Protein: 6.9 g/dL (ref 6.1–8.1)

## 2017-09-07 LAB — CBC WITH DIFFERENTIAL/PLATELET
Basophils Absolute: 69 cells/uL (ref 0–200)
Basophils Relative: 1.5 %
Eosinophils Absolute: 28 cells/uL (ref 15–500)
Eosinophils Relative: 0.6 %
HCT: 40.2 % (ref 38.5–50.0)
Hemoglobin: 14.3 g/dL (ref 13.2–17.1)
Lymphs Abs: 1366 cells/uL (ref 850–3900)
MCH: 32.7 pg (ref 27.0–33.0)
MCHC: 35.6 g/dL (ref 32.0–36.0)
MCV: 92 fL (ref 80.0–100.0)
MPV: 9.2 fL (ref 7.5–12.5)
Monocytes Relative: 11.5 %
Neutro Abs: 2608 cells/uL (ref 1500–7800)
Neutrophils Relative %: 56.7 %
Platelets: 404 10*3/uL — ABNORMAL HIGH (ref 140–400)
RBC: 4.37 10*6/uL (ref 4.20–5.80)
RDW: 12.3 % (ref 11.0–15.0)
Total Lymphocyte: 29.7 %
WBC mixed population: 529 cells/uL (ref 200–950)
WBC: 4.6 10*3/uL (ref 3.8–10.8)

## 2017-09-07 LAB — LIPID PANEL
Cholesterol: 131 mg/dL (ref <200)
HDL: 82 mg/dL (ref >40)
LDL Cholesterol (Calc): 37 mg/dL (calc)
Non-HDL Cholesterol (Calc): 49 mg/dL (calc) (ref <130)
Total CHOL/HDL Ratio: 1.6 (calc) (ref <5.0)
Triglycerides: 41 mg/dL (ref <150)

## 2017-09-07 LAB — TEST AUTHORIZATION

## 2017-09-07 LAB — HEMOGLOBIN A1C W/OUT EAG: Hgb A1c MFr Bld: 5.2 % of total Hgb (ref <5.7)

## 2017-09-09 ENCOUNTER — Telehealth: Payer: Self-pay | Admitting: *Deleted

## 2017-09-09 DIAGNOSIS — M150 Primary generalized (osteo)arthritis: Principal | ICD-10-CM

## 2017-09-09 DIAGNOSIS — M8949 Other hypertrophic osteoarthropathy, multiple sites: Secondary | ICD-10-CM

## 2017-09-09 DIAGNOSIS — M159 Polyosteoarthritis, unspecified: Secondary | ICD-10-CM

## 2017-09-09 NOTE — Telephone Encounter (Signed)
Received call from patient.   States that he has requested back brace and knee brace from his insurance.   States that he has increased pain in lower back and B knees. States that forms are to be faxed to office. Requested MD sign them for him to receive braces.

## 2017-09-10 NOTE — Telephone Encounter (Signed)
I dont order any braces, these have to come from an orthopedist

## 2017-09-16 NOTE — Telephone Encounter (Signed)
Call placed to patient and patient made aware.   Patient requested referral to ortho.   Referral orders sent.

## 2017-09-17 ENCOUNTER — Ambulatory Visit: Payer: Medicare HMO | Admitting: Emergency Medicine

## 2017-10-20 ENCOUNTER — Ambulatory Visit (INDEPENDENT_AMBULATORY_CARE_PROVIDER_SITE_OTHER): Payer: Medicare HMO

## 2017-10-20 ENCOUNTER — Encounter (INDEPENDENT_AMBULATORY_CARE_PROVIDER_SITE_OTHER): Payer: Self-pay | Admitting: Specialist

## 2017-10-20 ENCOUNTER — Ambulatory Visit (INDEPENDENT_AMBULATORY_CARE_PROVIDER_SITE_OTHER): Payer: Medicare HMO | Admitting: Specialist

## 2017-10-20 VITALS — BP 122/67 | HR 66 | Ht 68.0 in | Wt 145.0 lb

## 2017-10-20 DIAGNOSIS — M25561 Pain in right knee: Secondary | ICD-10-CM

## 2017-10-20 DIAGNOSIS — M25562 Pain in left knee: Secondary | ICD-10-CM

## 2017-10-20 DIAGNOSIS — M47816 Spondylosis without myelopathy or radiculopathy, lumbar region: Secondary | ICD-10-CM | POA: Diagnosis not present

## 2017-10-20 DIAGNOSIS — M174 Other bilateral secondary osteoarthritis of knee: Secondary | ICD-10-CM | POA: Diagnosis not present

## 2017-10-20 DIAGNOSIS — M545 Low back pain: Secondary | ICD-10-CM

## 2017-10-20 DIAGNOSIS — M5136 Other intervertebral disc degeneration, lumbar region: Secondary | ICD-10-CM | POA: Diagnosis not present

## 2017-10-20 DIAGNOSIS — G8929 Other chronic pain: Secondary | ICD-10-CM | POA: Diagnosis not present

## 2017-10-20 MED ORDER — CELECOXIB 100 MG PO CAPS: 100.0000 mg | ORAL_CAPSULE | Freq: Two times a day (BID) | ORAL | 60 refills | Status: DC

## 2017-10-20 NOTE — Patient Instructions (Signed)
The main ways of treat osteoarthritis, that are found to be success. Weight loss helps to decrease pain. Exercise is important to maintaining cartilage and thickness and strengthening. NSAIDs like celebrex are meds decreasing the inflamation. Ice is okay  In afternoon and evening and hot shower in the am Avoid bending, stooping and avoid lifting weights greater than 10 lbs. Avoid prolong standing and walking. Avoid frequent bending and stooping  No lifting greater than 10 lbs. May use ice or moist heat for pain. Weight loss is of benefit. Handicap license is approved.

## 2017-10-20 NOTE — Progress Notes (Signed)
Office Visit Note   Patient: Zachary Chambers           Date of Birth: 1950-11-26           MRN: [id number] Visit Date: 10/20/2017              Requested by: [name], MD [address] [place], [place] [address] PCP: [name], MD   Assessment & Plan: Visit Diagnoses:  1. Chronic pain of both knees   2. Low back pain, unspecified back pain laterality, unspecified chronicity, with sciatica presence unspecified   3. Lumbar degenerative disc disease   4. Spondylosis without myelopathy or radiculopathy, lumbar region   5. Other bilateral secondary osteoarthritis of knee     Plan:The main ways of treat osteoarthritis, that are found to be success. Weight loss helps to decrease pain. Exercise is important to maintaining cartilage and thickness and strengthening. NSAIDs like celebrex are meds decreasing the inflamation. Ice is okay  In afternoon and evening and hot shower in the am Avoid bending, stooping and avoid lifting weights greater than 10 lbs. Avoid prolong standing and walking. Avoid frequent bending and stooping  No lifting greater than 10 lbs. May use ice or moist heat for pain. Weight loss is of benefit. Handicap license is approved.  Follow-Up Instructions: No Follow-up on file.   Orders:  Orders Placed This Encounter  Procedures  . XR Knee 1-2 Views Right  . XR Knee 1-2 Views Left  . XR Lumbar Spine 2-3 Views   No orders of the defined types were placed in this encounter.     Procedures: No procedures performed   Clinical Data: No additional findings.   Subjective: Chief Complaint  Patient presents with  . Right Knee - Pain  . Left Knee - Pain  . Lower Back - Pain    67 year old male with history of low back pain and knee problems he was a Nature conservation officer and retired from [employer] when his rotator cuff was damaged. He underwent evaluation By Dr. Daylene Katayama and he recommended that he see Dr. Royetta Crochet a shoulder specialist at  [facility]. Recommended a shoulder debridement but he decided against that surgery. The back pain is constant, pain is worsened by bending and moving the back to load the stove, twisting wrong causes it to lock down. Occasional burning sensation in the left leg posteriorly extending below the knees sometimes. He has numbness in the feet for abut 10 years. Stopped working in 2008. Back pain is staying and not improving. He finds that sitting helping with the pain. Walking does not cause pain but he is limited is walking to 100 yards or so. Partly due to SOB and still smoking 1ppd, previously more 1 1/2 or 2 ppd when work. The back pain is not  Constant at night, once in a while may bother him. He is losing weight, initially on purpose but now he is still losing weight. He eats when he wants. In the AM standing he feels pain into the Knees right greater than the left. Not able to squat. Can kneel and can get up from the floor if he has some thing to hold onto. He is able to reach shoes and socks. He has difficulty with  Stairs. Enjoys doing work with wood.  No bowel or bladder difficulty. Sees Dr. Vic Blackbird with [facility]. Has COPD, MI in 2000, Dr. Doreatha Lew placed 3 stents  Has been on  ASA,  His knees are as painful as the back. The knees is a dull pain, relates to starting      Review of Systems  Constitutional: Negative.   HENT: Negative.   Eyes: Negative.   Respiratory: Negative.   Cardiovascular: Negative.   Gastrointestinal: Negative.   Endocrine: Negative.   Genitourinary: Negative.   Musculoskeletal: Negative.   Skin: Negative.   Allergic/Immunologic: Negative.   Neurological: Negative.   Hematological: Negative.   Psychiatric/Behavioral: Negative.      Objective: Vital Signs: BP 122/67   Pulse 66   Ht 5\' 8"  (1.727 m)   Wt 145 lb (65.8 kg)   BMI 22.05 kg/m   Physical Exam  Constitutional: He is oriented to person, place, and time. He appears well-developed and  well-nourished.  HENT:  Head: Normocephalic and atraumatic.  Eyes: EOM are normal. Pupils are equal, round, and reactive to light.  Neck: Normal range of motion. Neck supple.  Pulmonary/Chest: Effort normal and breath sounds normal.  Abdominal: Soft. Bowel sounds are normal.  Musculoskeletal: Normal range of motion.       Right knee: He exhibits no effusion.       Left knee: He exhibits no effusion.  Neurological: He is alert and oriented to person, place, and time.  Skin: Skin is warm and dry.  Psychiatric: He has a normal mood and affect. His behavior is normal. Judgment and thought content normal.    Right Knee Exam   Muscle Strength  The patient has normal right knee strength.  Tenderness  The patient is experiencing tenderness in the medial joint line.  Range of Motion  Extension: normal  Flexion: 130   Tests  McMurray:  Medial - negative Lateral - negative Varus: positive  Lachman:  Anterior - negative    Posterior - negative Drawer:  Anterior - negative    Posterior - negative Pivot shift: negative Patellar apprehension: positive  Other  Erythema: absent Scars: absent Sensation: normal Pulse: present Swelling: none Effusion: no effusion present   Left Knee Exam   Muscle Strength  The patient has normal left knee strength.  Tenderness  The patient is experiencing tenderness in the medial joint line.  Range of Motion  Extension: normal  Flexion: 130   Tests  Valgus: positive Lachman:  Anterior - negative    Posterior - negative Drawer:  Anterior - negative     Posterior - negative Pivot shift: negative Patellar apprehension: positive  Other  Sensation: normal Swelling: none Effusion: no effusion present   Back Exam   Tenderness  The patient is experiencing tenderness in the lumbar.  Range of Motion  Extension: normal  Flexion: normal  Lateral bend right: normal  Lateral bend left: normal  Rotation right: normal  Rotation left:  normal   Muscle Strength  The patient has normal back strength. Right Quadriceps:  5/5  Left Quadriceps:  5/5  Right Hamstrings:  5/5  Left Hamstrings:  5/5   Tests  Straight leg raise right: negative Straight leg raise left: negative  Reflexes  Patellar: normal Achilles: normal Babinski's sign: normal   Other  Toe walk: normal Heel walk: normal Sensation: normal Gait: normal  Erythema: no back redness Scars: absent      Specialty Comments:  No specialty comments available.  Imaging: No results found.   PMFS History: Patient Active Problem List   Diagnosis Date Noted  . Leukopenia 05/04/2017  . Bruit 01/22/2014  . Muscle spasm of back 09/20/2013  . COPD (chronic obstructive  pulmonary disease) ([place]) 01/14/2011  . CAD (coronary artery disease) 01/14/2011  . GERD (gastroesophageal reflux disease) 01/14/2011  . TOBACCO ABUSE 07/25/2009  . Osteoarthritis 07/25/2009  . ROTATOR CUFF SYNDROME, RIGHT 07/25/2009   Past Medical History:  Diagnosis Date  . COPD (chronic obstructive pulmonary disease) ([place])   . DJD (degenerative joint disease)    diffusely  . GERD (gastroesophageal reflux disease)   . Hyperlipidemia   . MI (myocardial infarction) ([place]) 2006   small MI and stents x 3  Dr. Doreatha Lew  . PUD (peptic ulcer disease)    with bleeding  . Rotator cuff disorder    has been evaluated by Dr Clifton  and Koleen Distance    Family History  Problem Relation Age of Onset  . Ovarian cancer Mother   . Diabetes Maternal Grandfather   . COPD Father   . Diabetes Paternal Grandfather   . Colon cancer Paternal Grandfather   . Esophageal cancer Neg Hx   . Rectal cancer Neg Hx   . Stomach cancer Neg Hx     Past Surgical History:  Procedure Laterality Date  . COLONOSCOPY    . EYE SURGERY     right eye removed at age 73  . heart stent     x3- 2006   Social History   Occupational History  . Not on file  Tobacco Use  . Smoking status: Current Every Day Smoker     Packs/day: 1.00    Years: 40.00    Pack years: 40.00    Types: Cigarettes  . Smokeless tobacco: Never Used  Substance and Sexual Activity  . Alcohol use: Yes    Alcohol/week: 18.0 oz    Types: 30 Cans of beer per week    Comment: carton of beer weekly  . Drug use: No  . Sexual activity: Yes

## 2017-10-25 ENCOUNTER — Other Ambulatory Visit: Payer: Self-pay | Admitting: Family Medicine

## 2017-11-10 ENCOUNTER — Other Ambulatory Visit: Payer: Self-pay | Admitting: Family Medicine

## 2017-11-15 ENCOUNTER — Other Ambulatory Visit (HOSPITAL_COMMUNITY): Payer: Self-pay | Admitting: *Deleted

## 2017-11-15 ENCOUNTER — Ambulatory Visit: Payer: Medicare HMO | Admitting: Emergency Medicine

## 2017-11-15 DIAGNOSIS — D72819 Decreased white blood cell count, unspecified: Secondary | ICD-10-CM

## 2017-11-16 ENCOUNTER — Inpatient Hospital Stay (HOSPITAL_COMMUNITY): Payer: Medicare HMO | Attending: Internal Medicine

## 2017-11-16 DIAGNOSIS — M2550 Pain in unspecified joint: Secondary | ICD-10-CM | POA: Diagnosis not present

## 2017-11-16 DIAGNOSIS — F17210 Nicotine dependence, cigarettes, uncomplicated: Secondary | ICD-10-CM | POA: Diagnosis not present

## 2017-11-16 DIAGNOSIS — Z7289 Other problems related to lifestyle: Secondary | ICD-10-CM | POA: Insufficient documentation

## 2017-11-16 DIAGNOSIS — D708 Other neutropenia: Secondary | ICD-10-CM | POA: Diagnosis not present

## 2017-11-16 DIAGNOSIS — D72819 Decreased white blood cell count, unspecified: Secondary | ICD-10-CM

## 2017-11-16 LAB — COMPREHENSIVE METABOLIC PANEL
ALT: 11 U/L — ABNORMAL LOW (ref 17–63)
AST: 22 U/L (ref 15–41)
Albumin: 3.9 g/dL (ref 3.5–5.0)
Alkaline Phosphatase: 61 U/L (ref 38–126)
Anion gap: 11 (ref 5–15)
BUN: 7 mg/dL (ref 6–20)
CO2: 27 mmol/L (ref 22–32)
Calcium: 9 mg/dL (ref 8.9–10.3)
Chloride: 98 mmol/L — ABNORMAL LOW (ref 101–111)
Creatinine, Ser: 0.89 mg/dL (ref 0.61–1.24)
GFR calc Af Amer: >60 mL/min (ref >60)
GFR calc non Af Amer: >60 mL/min (ref >60)
Glucose, Bld: 112 mg/dL — ABNORMAL HIGH (ref 65–99)
Potassium: 3.9 mmol/L (ref 3.5–5.1)
Sodium: 136 mmol/L (ref 135–145)
Total Bilirubin: 1 mg/dL (ref 0.3–1.2)
Total Protein: 7.2 g/dL (ref 6.5–8.1)

## 2017-11-16 LAB — CBC WITH DIFFERENTIAL/PLATELET
Basophils Absolute: 0.1 10*3/uL (ref 0.0–0.1)
Basophils Relative: 2 %
Eosinophils Absolute: 0 10*3/uL (ref 0.0–0.7)
Eosinophils Relative: 2 %
HCT: 40.5 % (ref 39.0–52.0)
Hemoglobin: 13.3 g/dL (ref 13.0–17.0)
Lymphocytes Relative: 42 %
Lymphs Abs: 1.1 10*3/uL (ref 0.7–4.0)
MCH: 31.8 pg (ref 26.0–34.0)
MCHC: 32.8 g/dL (ref 30.0–36.0)
MCV: 96.9 fL (ref 78.0–100.0)
Monocytes Absolute: 0.3 10*3/uL (ref 0.1–1.0)
Monocytes Relative: 12 %
Neutro Abs: 1.1 10*3/uL — ABNORMAL LOW (ref 1.7–7.7)
Neutrophils Relative %: 42 %
Platelets: 294 10*3/uL (ref 150–400)
RBC: 4.18 MIL/uL — ABNORMAL LOW (ref 4.22–5.81)
RDW: 14.7 % (ref 11.5–15.5)
WBC: 2.6 10*3/uL — ABNORMAL LOW (ref 4.0–10.5)

## 2017-11-23 ENCOUNTER — Ambulatory Visit (HOSPITAL_COMMUNITY): Payer: Medicare HMO | Admitting: Adult Health

## 2017-11-25 ENCOUNTER — Inpatient Hospital Stay (HOSPITAL_BASED_OUTPATIENT_CLINIC_OR_DEPARTMENT_OTHER): Payer: Medicare HMO | Admitting: Internal Medicine

## 2017-11-25 ENCOUNTER — Encounter (HOSPITAL_COMMUNITY): Payer: Self-pay | Admitting: Internal Medicine

## 2017-11-25 ENCOUNTER — Other Ambulatory Visit: Payer: Self-pay

## 2017-11-25 DIAGNOSIS — Z7289 Other problems related to lifestyle: Secondary | ICD-10-CM

## 2017-11-25 DIAGNOSIS — D708 Other neutropenia: Secondary | ICD-10-CM | POA: Diagnosis not present

## 2017-11-25 DIAGNOSIS — M2550 Pain in unspecified joint: Secondary | ICD-10-CM

## 2017-11-25 DIAGNOSIS — M25559 Pain in unspecified hip: Secondary | ICD-10-CM

## 2017-11-25 DIAGNOSIS — F17210 Nicotine dependence, cigarettes, uncomplicated: Secondary | ICD-10-CM | POA: Diagnosis not present

## 2017-11-25 NOTE — Progress Notes (Signed)
Diagnosis Other neutropenia (HCC) - Plan: CBC with Differential/Platelet, Comprehensive metabolic panel, Lactate dehydrogenase, ANA, IFA (with reflex), Sedimentation rate, Rheumatoid factor, C-reactive protein, Protein electrophoresis, serum  Pain in joint involving pelvic region and thigh, unspecified laterality - Plan: CBC with Differential/Platelet, Comprehensive metabolic panel, Lactate dehydrogenase, ANA, IFA (with reflex), Sedimentation rate, Rheumatoid factor, C-reactive protein, Protein electrophoresis, serum  Staging Cancer Staging No matching staging information was found for the patient.  Assessment and Plan:   1.  Leukopenia.  White count is stable at 2.6.  He has undergone hepatitis and HIV evaluation in the past that were negative on labs done 05/04/2017.  He underwent an abdominal ultrasound in September 2018 that showed no hepatosplenomegaly.  He will return to clinic in 6 months for repeat lab evaluation.  2.  Joint pain.  Physical exam shows findings that appear to be consistent with arthritic changes.  Will check sed rate, C-reactive protein, rheumatoid factor, ANA, SPEP on return to clinic.  3.  Smoking.  Patient was being followed by the lung cancer screening clinic.  CT chest done 01/20/2017 showed evidence of COPD and atherosclerosis.  He reports he will follow-up with the clinic for next imaging which would be in June 2019.  4.  Chronic alcohol use.  Recent liver function tests are within normal limits.   Interval History:  67 y.o. Male previously followed by Dr. Talbert Cage.  Blood work performed on 03/02/2017 which demonstrated WBC 3.1K, hemoglobin 13.8 g/dL, hematocrit 40.6%, plate count 449Q, differential demonstrated an [place] 1457 otherwise differential was normal. Repeat CBC on 03/18/17 demonstrated WBC 3.1K, hemoglobin14 g/dL, hematocrit 41.2%, platelet count 328K, differential demonstrated an [place] 1333, otherwise differential normal. Baseline WBC from 07/17/2015 was 4.6K. WBC  from 02/13/2015 was 3.7K. 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  Rare atypical lymphs. No immature cells are identified. Normocytic/Normochromic anemia with unremarkable RBC morphology. Platelets are unremarkable.   Current Status: Pt seen today for follow-up.  He is here today to go over lab studies.   Problem List Patient Active Problem List   Diagnosis Date Noted  . Leukopenia [D72.819] 05/04/2017  . Bruit [R09.89] 01/22/2014  . Muscle spasm of back [M62.830] 09/20/2013  . COPD (chronic obstructive pulmonary disease) ([place]) [J44.9] 01/14/2011  . CAD (coronary artery disease) [I25.10] 01/14/2011  . GERD (gastroesophageal reflux disease) [K21.9] 01/14/2011  . TOBACCO ABUSE [F17.200] 07/25/2009  . Osteoarthritis [M19.90] 07/25/2009  . ROTATOR CUFF SYNDROME, RIGHT [M71.9, M67.919] 07/25/2009    Past Medical History Past Medical History:  Diagnosis Date  . COPD (chronic obstructive pulmonary disease) ([place])   . DJD (degenerative joint disease)    diffusely  . GERD (gastroesophageal reflux disease)   . Hyperlipidemia   . MI (myocardial infarction) ([place]) 2006   small MI and stents x 3  Dr. Doreatha Lew  . PUD (peptic ulcer disease)    with bleeding  . Rotator cuff disorder    has been evaluated by Dr Clifton James and Koleen Distance    Past Surgical History Past Surgical History:  Procedure Laterality Date  . COLONOSCOPY    . EYE SURGERY     right eye removed at age 29  . heart stent     x3- 2006    Family History Family History  Problem Relation Age of Onset  . Ovarian cancer Mother   . Diabetes Maternal Grandfather   . COPD Father   . Diabetes Paternal  Grandfather   . Colon cancer Paternal Grandfather   . Esophageal cancer Neg Hx   . Rectal cancer Neg Hx   . Stomach cancer Neg Hx      Social History  reports that he has been smoking  cigarettes.  He has a 40.00 pack-year smoking history. He has never used smokeless tobacco. He reports that he drinks about 18.0 oz of alcohol per week. He reports that he does not use drugs.  Medications  Current Outpatient Medications:  .  aspirin 81 MG tablet, Take 81 mg by mouth daily.  , Disp: , Rfl:  .  celecoxib (CELEBREX) 100 MG capsule, Take 1 capsule (100 mg total) by mouth 2 (two) times daily., Disp: 60 capsule, Rfl: 60 .  fluticasone (FLONASE) 50 MCG/ACT nasal spray, USE 2 SPRAYS IN EACH NOSTRIL EVERY DAY, Disp: 16 g, Rfl: 6 .  Glucosamine HCl (GLUCOSAMINE PO), Take 1,500 mg by mouth daily., Disp: , Rfl:  .  omeprazole (PRILOSEC) 40 MG capsule, TAKE 1 CAPSULE BY MOUTH EVERY DAY, Disp: 90 capsule, Rfl: 3 .  simvastatin (ZOCOR) 40 MG tablet, TAKE 1 TABLET BY MOUTH AT BEDTIME, Disp: 30 tablet, Rfl: 3 .  SYMBICORT 160-4.5 MCG/ACT inhaler, inhale 2 PUFFS into THE lungs 2 TIMES DAILY, Disp: 10.2 g, Rfl: 11 .  tiotropium (SPIRIVA) 18 MCG inhalation capsule, Place 1 capsule (18 mcg total) into inhaler and inhale daily., Disp: 90 capsule, Rfl: 3 .  traMADol (ULTRAM) 50 MG tablet, Take 1 tablet (50 mg total) by mouth every 6 (six) hours as needed for moderate pain., Disp: 30 tablet, Rfl: 0 .  VENTOLIN HFA 108 (90 Base) MCG/ACT inhaler, INHALE 2 PUFFS into THE lungs EVERY 6 HOURS AS NEEDED FOR WHEEZING OR SHORTNESS OF BREATH, Disp: 18 g, Rfl: 3 .  NITROSTAT 0.4 MG SL tablet, dissolve 1 TABLET UNDER THE TONGUE every 5 minutes AS NEEDED CHEST PAIN (Patient not taking: Reported on 11/25/2017), Disp: 25 tablet, Rfl: 0  Allergies Patient has no known allergies.  Review of Systems Review of Systems - Oncology ROS as per HPI otherwise 12 point ROS is negative.   Physical Exam  Vitals Wt Readings from Last 3 Encounters:  10/20/17 145 lb (65.8 kg)  09/03/17 131 lb (59.4 kg)  08/26/17 133 lb 12 oz (60.7 kg)   Temp Readings from Last 3 Encounters:  09/03/17 97.9 F (36.6 C) (Oral)  05/25/17  97.7 F (36.5 C) (Oral)  03/02/17 98.3 F (36.8 C) (Oral)   BP Readings from Last 3 Encounters:  10/20/17 122/67  09/03/17 128/72  08/26/17 122/70   Pulse Readings from Last 3 Encounters:  10/20/17 66  09/03/17 82  08/26/17 71   Constitutional: Well-developed, well-nourished, and in no distress.   HENT: Head: Normocephalic and atraumatic.  Mouth/Throat: No oropharyngeal exudate. Mucosa moist. Eyes: Pupils are equal, round, and reactive to light. Conjunctivae are normal. No scleral icterus.  Neck: Normal range of motion. Neck supple. No JVD present.  Cardiovascular: Normal rate, regular rhythm and normal heart sounds.  Exam reveals no gallop and no friction rub.   No murmur heard. Pulmonary/Chest: Effort normal and breath sounds normal. No respiratory distress. No wheezes.No rales.  Abdominal: Soft. Bowel sounds are normal. No distension. There is no tenderness. There is no guarding.  Musculoskeletal: No edema or tenderness.  Lymphadenopathy: No cervical, axillary or supraclavicular adenopathy.  Neurological: Alert and oriented to person, place, and time. No cranial nerve deficit.  Skin: Skin is warm and dry. No rash  noted. No erythema. No pallor.  Psychiatric: Affect and judgment normal.   Labs No visits with results within 3 Day(s) from this visit.  Latest known visit with results is:  Appointment on 11/16/2017  Component Date Value Ref Range Status  . Sodium 11/16/2017 136  135 - 145 mmol/L Final  . Potassium 11/16/2017 3.9  3.5 - 5.1 mmol/L Final  . Chloride 11/16/2017 98* 101 - 111 mmol/L Final  . CO2 11/16/2017 27  22 - 32 mmol/L Final  . Glucose, Bld 11/16/2017 112* 65 - 99 mg/dL Final  . BUN 11/16/2017 7  6 - 20 mg/dL Final  . Creatinine, Ser 11/16/2017 0.89  0.61 - 1.24 mg/dL Final  . Calcium 11/16/2017 9.0  8.9 - 10.3 mg/dL Final  . Total Protein 11/16/2017 7.2  6.5 - 8.1 g/dL Final  . Albumin 11/16/2017 3.9  3.5 - 5.0 g/dL Final  . AST 11/16/2017 22  15 - 41 U/L  Final  . ALT 11/16/2017 11* 17 - 63 U/L Final  . Alkaline Phosphatase 11/16/2017 61  38 - 126 U/L Final  . Total Bilirubin 11/16/2017 1.0  0.3 - 1.2 mg/dL Final  . GFR calc non Af Amer 11/16/2017 >60  >60 mL/min Final  . GFR calc Af Amer 11/16/2017 >60  >60 mL/min Final   Comment: (NOTE) The eGFR has been calculated using the CKD EPI equation. This calculation has not been validated in all clinical situations. eGFR's persistently <60 mL/min signify possible Chronic Kidney Disease.   Georgiann Hahn gap 11/16/2017 11  5 - 15 Final   Performed at [facility], [address], [place], [place] [address]  . WBC 11/16/2017 2.6* 4.0 - 10.5 K/uL Final  . RBC 11/16/2017 4.18* 4.22 - 5.81 MIL/uL Final  . Hemoglobin 11/16/2017 13.3  13.0 - 17.0 g/dL Final  . HCT 11/16/2017 40.5  39.0 - 52.0 % Final  . MCV 11/16/2017 96.9  78.0 - 100.0 fL Final  . MCH 11/16/2017 31.8  26.0 - 34.0 pg Final  . MCHC 11/16/2017 32.8  30.0 - 36.0 g/dL Final  . RDW 11/16/2017 14.7  11.5 - 15.5 % Final  . Platelets 11/16/2017 294  150 - 400 K/uL Final  . Neutrophils Relative % 11/16/2017 42  % Final  . Neutro Abs 11/16/2017 1.1* 1.7 - 7.7 K/uL Final  . Lymphocytes Relative 11/16/2017 42  % Final  . Lymphs Abs 11/16/2017 1.1  0.7 - 4.0 K/uL Final  . Monocytes Relative 11/16/2017 12  % Final  . Monocytes Absolute 11/16/2017 0.3  0.1 - 1.0 K/uL Final  . Eosinophils Relative 11/16/2017 2  % Final  . Eosinophils Absolute 11/16/2017 0.0  0.0 - 0.7 K/uL Final  . Basophils Relative 11/16/2017 2  % Final  . Basophils Absolute 11/16/2017 0.1  0.0 - 0.1 K/uL Final   Performed at [facility], [address], [place], [place] [address]     Pathology Orders Placed This Encounter  Procedures  . CBC with Differential/Platelet    Standing Status:   Future    Standing Expiration Date:   11/26/2018  . Comprehensive metabolic panel    Standing Status:   Future    Standing Expiration Date:   11/26/2018  . Lactate dehydrogenase     Standing Status:   Future    Standing Expiration Date:   11/26/2018  . ANA, IFA (with reflex)    Standing Status:   Future    Standing Expiration Date:   11/26/2018  . Sedimentation  rate    Standing Status:   Future    Standing Expiration Date:   11/26/2018  . Rheumatoid factor    Standing Status:   Future    Standing Expiration Date:   11/26/2018  . C-reactive protein    Standing Status:   Future    Standing Expiration Date:   11/26/2018  . Protein electrophoresis, serum    Standing Status:   Future    Standing Expiration Date:   11/26/2018       Zoila Shutter MD

## 2017-11-30 ENCOUNTER — Ambulatory Visit (HOSPITAL_COMMUNITY): Payer: Medicare HMO | Admitting: Adult Health

## 2017-12-01 ENCOUNTER — Encounter (INDEPENDENT_AMBULATORY_CARE_PROVIDER_SITE_OTHER): Payer: Self-pay | Admitting: Specialist

## 2017-12-01 ENCOUNTER — Ambulatory Visit (INDEPENDENT_AMBULATORY_CARE_PROVIDER_SITE_OTHER): Payer: Medicare HMO | Admitting: Specialist

## 2017-12-01 ENCOUNTER — Encounter (INDEPENDENT_AMBULATORY_CARE_PROVIDER_SITE_OTHER): Payer: Self-pay

## 2017-12-01 VITALS — BP 147/73 | HR 72 | Ht 68.0 in | Wt 135.0 lb

## 2017-12-01 DIAGNOSIS — Z5321 Procedure and treatment not carried out due to patient leaving prior to being seen by health care provider: Secondary | ICD-10-CM

## 2017-12-13 ENCOUNTER — Encounter (INDEPENDENT_AMBULATORY_CARE_PROVIDER_SITE_OTHER): Payer: Self-pay | Admitting: Specialist

## 2017-12-13 NOTE — Progress Notes (Signed)
Patient left without being seen. Zachary Chambers

## 2017-12-16 ENCOUNTER — Encounter: Payer: Self-pay | Admitting: Emergency Medicine

## 2017-12-16 ENCOUNTER — Ambulatory Visit: Payer: Medicare HMO | Admitting: Emergency Medicine

## 2017-12-16 DIAGNOSIS — J449 Chronic obstructive pulmonary disease, unspecified: Secondary | ICD-10-CM

## 2017-12-16 NOTE — Assessment & Plan Note (Signed)
Please continue your inhaled medicine as you have been taking it. Keep albuterol available to use if needed for shortness of breath, coughing, wheezing Remember to rinse and gargle after taking your inhalers. Try to decrease your smoking if at all possible. Follow with Dr Lamonte Sakai in 6 months or sooner if you have any problems

## 2017-12-16 NOTE — Progress Notes (Signed)
Subjective:    Patient ID: Janeann Merl, male    DOB: [birth year], 67 y.o.   MRN: [id number]  COPD  He complains of cough, shortness of breath and wheezing. Pertinent negatives include no ear pain, fever, headaches, postnasal drip, rhinorrhea, sneezing, sore throat or trouble swallowing. His past medical history is significant for COPD.    ROV 12/16/17 --this follow-up visit for 67 year old gentleman with a history of COPD, tobacco use.  He has been managed on Spiriva, Symbicort.  He has albuterol available and uses it approximately once daily. He tells me that he had flu sx several months ago. He continues to smoke about 1 pk a day. He has some throat soreness, dry mouth. He doesn't rinse after his inhalers.  Occasional cough, no sputum. No flares, no abx, no pred.    No flowsheet data found.  Review of Systems  Constitutional: Negative for fever and unexpected weight change.  HENT: Negative for congestion, dental problem, ear pain, nosebleeds, postnasal drip, rhinorrhea, sinus pressure, sneezing, sore throat and trouble swallowing.   Eyes: Negative for redness and itching.  Respiratory: Positive for cough, shortness of breath and wheezing. Negative for chest tightness.   Cardiovascular: Negative for palpitations and leg swelling.  Gastrointestinal: Negative for nausea and vomiting.  Genitourinary: Negative for dysuria.  Musculoskeletal: Negative for joint swelling.  Skin: Negative for rash.  Neurological: Negative for headaches.  Hematological: Bruises/bleeds easily.  Psychiatric/Behavioral: Negative for dysphoric mood. The patient is not nervous/anxious.     No Known Allergies   Outpatient Medications Prior to Visit  Medication Sig Dispense Refill  . aspirin 81 MG tablet Take 81 mg by mouth daily.      . celecoxib (CELEBREX) 100 MG capsule Take 1 capsule (100 mg total) by mouth 2 (two) times daily. 60 capsule 60  . fluticasone (FLONASE) 50 MCG/ACT nasal spray USE 2 SPRAYS IN EACH  NOSTRIL EVERY DAY 16 g 6  . Glucosamine HCl (GLUCOSAMINE PO) Take 1,500 mg by mouth daily.    Marland Kitchen NITROSTAT 0.4 MG SL tablet dissolve 1 TABLET UNDER THE TONGUE every 5 minutes AS NEEDED CHEST PAIN 25 tablet 0  . omeprazole (PRILOSEC) 40 MG capsule TAKE 1 CAPSULE BY MOUTH EVERY DAY 90 capsule 3  . simvastatin (ZOCOR) 40 MG tablet TAKE 1 TABLET BY MOUTH AT BEDTIME 30 tablet 3  . SYMBICORT 160-4.5 MCG/ACT inhaler inhale 2 PUFFS into THE lungs 2 TIMES DAILY 10.2 g 11  . tiotropium (SPIRIVA) 18 MCG inhalation capsule Place 1 capsule (18 mcg total) into inhaler and inhale daily. 90 capsule 3  . traMADol (ULTRAM) 50 MG tablet Take 1 tablet (50 mg total) by mouth every 6 (six) hours as needed for moderate pain. 30 tablet 0  . VENTOLIN HFA 108 (90 Base) MCG/ACT inhaler INHALE 2 PUFFS into THE lungs EVERY 6 HOURS AS NEEDED FOR WHEEZING OR SHORTNESS OF BREATH 18 g 3   No facility-administered medications prior to visit.          Objective:   Physical Exam Vitals:   12/16/17 1207  BP: 118/70  Pulse: 78  SpO2: 95%  Weight: 131 lb (59.4 kg)  Height: 5\' 8"  (1.727 m)   Gen: Pleasant, thin elderly man, in no distress,  normal affect  ENT: No lesions,  mouth clear,  oropharynx clear, no postnasal drip  Neck: No JVD, no stridor  Lungs: No use of accessory muscles, distant, clear on normal respiration.  He does have wheeze on forced expiration  Cardiovascular: RRR, heart sounds normal, no murmur or gallops, no peripheral edema  Musculoskeletal: No deformities, no cyanosis or clubbing  Neuro: alert, non focal  Skin: Warm, no lesions or rashes      Assessment & Plan:  COPD (chronic obstructive pulmonary disease) Please continue your inhaled medicine as you have been taking it. Keep albuterol available to use if needed for shortness of breath, coughing, wheezing Remember to rinse and gargle after taking your inhalers. Try to decrease your smoking if at all possible. Follow with Dr Lamonte Sakai in 6  months or sooner if you have any problems  Baltazar Apo, MD, PhD 12/16/2017, 12:27 PM [facility] [phone] or if no answer [phone]

## 2017-12-16 NOTE — Patient Instructions (Signed)
Please continue your inhaled medicine as you have been taking it. Keep albuterol available to use if needed for shortness of breath, coughing, wheezing Remember to rinse and gargle after taking your inhalers. Try to decrease your smoking if at all possible. Follow with Dr Lamonte Sakai in 6 months or sooner if you have any problems

## 2018-02-10 ENCOUNTER — Other Ambulatory Visit: Payer: Self-pay | Admitting: Family Medicine

## 2018-02-18 ENCOUNTER — Other Ambulatory Visit: Payer: Self-pay | Admitting: Family Medicine

## 2018-03-04 ENCOUNTER — Other Ambulatory Visit: Payer: Self-pay

## 2018-03-04 ENCOUNTER — Ambulatory Visit (INDEPENDENT_AMBULATORY_CARE_PROVIDER_SITE_OTHER): Payer: Medicare HMO | Admitting: Family Medicine

## 2018-03-04 ENCOUNTER — Encounter: Payer: Self-pay | Admitting: Family Medicine

## 2018-03-04 VITALS — BP 130/68 | HR 76 | Temp 97.9°F | Resp 18 | Ht 68.0 in | Wt 134.0 lb

## 2018-03-04 DIAGNOSIS — Z0000 Encounter for general adult medical examination without abnormal findings: Secondary | ICD-10-CM

## 2018-03-04 DIAGNOSIS — R252 Cramp and spasm: Secondary | ICD-10-CM

## 2018-03-04 DIAGNOSIS — F17200 Nicotine dependence, unspecified, uncomplicated: Secondary | ICD-10-CM

## 2018-03-04 DIAGNOSIS — R413 Other amnesia: Secondary | ICD-10-CM

## 2018-03-04 DIAGNOSIS — Z125 Encounter for screening for malignant neoplasm of prostate: Secondary | ICD-10-CM | POA: Diagnosis not present

## 2018-03-04 DIAGNOSIS — J449 Chronic obstructive pulmonary disease, unspecified: Secondary | ICD-10-CM

## 2018-03-04 DIAGNOSIS — Z789 Other specified health status: Secondary | ICD-10-CM

## 2018-03-04 DIAGNOSIS — F1090 Alcohol use, unspecified, uncomplicated: Secondary | ICD-10-CM

## 2018-03-04 DIAGNOSIS — I25110 Atherosclerotic heart disease of native coronary artery with unstable angina pectoris: Secondary | ICD-10-CM | POA: Diagnosis not present

## 2018-03-04 MED ORDER — TIZANIDINE HCL 4 MG PO TABS: 4.0000 mg | ORAL_TABLET | Freq: Three times a day (TID) | ORAL | 1 refills | Status: DC | PRN

## 2018-03-04 NOTE — Progress Notes (Signed)
Subjective:   Patient presents for Medicare Annual/Subsequent preventive examination.    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 No Over the past two weeks, have you felt little interest or pleasure in doing things? No Have you lost interest or pleasure in daily life? No Do you often feel hopeless? No Do you cry easily over simple problems? No   Activities of Daily Living  In your present state of health, do you have any difficulty performing the following activities?:  Driving? No  Managing money? No  Feeding yourself? No  Getting from bed to chair? No  Climbing a flight of stairs? No  Preparing food and eating?: No  Bathing or showering? No  Getting dressed: No  Getting to the toilet? No  Using the toilet:No  Moving around from place to place: No  In the past year have you fallen or had a near fall?:No  Are you sexually active? No  Do you have more than one partner? No   Hearing Difficulties: No  Do you often ask people to speak up or repeat themselves? No  Do you experience ringing or noises in your ears? No Do you have difficulty understanding soft  or whispered voices? No  Do you feel that you have a problem with memory? Yes Do you often misplace items? Yes  Do you feel safe at home? Yes  Cognitive Testing  Alert? Yes Normal Appearance?Yes  Oriented to person? Yes Place? Yes  Time? Yes  Recall of three objects? Yes  Can perform simple calculations? Yes  Displays appropriate judgment?Yes  Can read the correct time from a watch face?Yes   List the Names of Other Physician/Practitioners you currently use:   Pulmonary  Hematology  Orthopedics     Screening Tests / Date Colonoscopy       UTD               Pneumonia vaccine- UTD Influenza Vaccine  Tetanus/tdap UTD  ROS: GEN- denies fatigue, fever, weight loss,weakness, recent illness HEENT- denies eye drainage, change in vision, nasal discharge, CVS- denies chest pain, palpitations RESP- denies SOB, cough, wheeze ABD- denies N/V, change in stools, abd pain GU- denies dysuria, hematuria, dribbling, incontinence MSK- denies joint pain, muscle aches, injury Neuro- denies headache, dizziness, syncope, seizure activity  Physical: GEN- NAD, alert and oriented x3 HEENT- PERRL, EOMI, non injected sclera, pink conjunctiva, MMM, oropharynx clear Neck- Supple, no thryomegaly CVS- RRR, distant HS no murmur RESP-CTAB ABD-NABS,soft,NT,ND EXT- No edema Pulses- Radial, DP- 2+      Assessment:  Annual wellness medicare exam   Plan:    During the course of the visit the patient was educated and counseled about appropriate screening and preventive services including:   PSA screening Shingrix sent to pharmacy   CAD- no current symptoms, followed by cardiology    COPD- continue inhalers, discussed tobacco cessationl, given samples of symbicort   Cramps- check electrolytes, given zanaflex refill  Alcohol use- he has been cuttingback onhis own, advised to decrease to 3-4 a day, states he will try Depression screen neg  Memory changes- able to recall, normal  orientation, will monitor memory, discussed cessation of tobacco/ETOH, working on reading/puzzles, exercise    Diet review for nutrition referral? Yes ____ Not Indicated __x__  Patient Instructions (the written plan) was given to the patient.  Medicare Attestation  I have personally reviewed:  The patient's medical and social history  Their use of alcohol, tobacco or illicit drugs  Their current medications and supplements  The patient's functional ability including ADLs,fall risks, home safety risks, cognitive, and hearing and visual impairment  Diet and physical activities  Evidence for depression or mood disorders  The patient's weight, height, BMI, and visual acuity have been recorded in the chart. I have made referrals, counseling, and provided education to the patient based on review of the above and I have provided the patient with a written personalized care plan for preventive services.

## 2018-03-04 NOTE — Patient Instructions (Addendum)
Shingrix sent to pharmacy  Schedule an eye exam  We will call with lab results  Muscle relaxer  Get flu shot in September  F/U 5 months

## 2018-03-05 LAB — CBC WITH DIFFERENTIAL/PLATELET
Basophils Absolute: 78 cells/uL (ref 0–200)
Basophils Relative: 1.7 %
Eosinophils Absolute: 41 cells/uL (ref 15–500)
Eosinophils Relative: 0.9 %
HCT: 38 % — ABNORMAL LOW (ref 38.5–50.0)
Hemoglobin: 13.4 g/dL (ref 13.2–17.1)
Lymphs Abs: 1104 cells/uL (ref 850–3900)
MCH: 32.8 pg (ref 27.0–33.0)
MCHC: 35.3 g/dL (ref 32.0–36.0)
MCV: 92.9 fL (ref 80.0–100.0)
MPV: 9.3 fL (ref 7.5–12.5)
Monocytes Relative: 9.6 %
Neutro Abs: 2935 cells/uL (ref 1500–7800)
Neutrophils Relative %: 63.8 %
Platelets: 387 10*3/uL (ref 140–400)
RBC: 4.09 10*6/uL — ABNORMAL LOW (ref 4.20–5.80)
RDW: 13.1 % (ref 11.0–15.0)
Total Lymphocyte: 24 %
WBC mixed population: 442 cells/uL (ref 200–950)
WBC: 4.6 10*3/uL (ref 3.8–10.8)

## 2018-03-05 LAB — COMPREHENSIVE METABOLIC PANEL
AG Ratio: 1.5 (calc) (ref 1.0–2.5)
ALT: 11 U/L (ref 9–46)
AST: 25 U/L (ref 10–35)
Albumin: 4.3 g/dL (ref 3.6–5.1)
Alkaline phosphatase (APISO): 70 U/L (ref 40–115)
BUN/Creatinine Ratio: 7 (calc) (ref 6–22)
BUN: 6 mg/dL — ABNORMAL LOW (ref 7–25)
CO2: 29 mmol/L (ref 20–32)
Calcium: 9.6 mg/dL (ref 8.6–10.3)
Chloride: 96 mmol/L — ABNORMAL LOW (ref 98–110)
Creat: 0.91 mg/dL (ref 0.70–1.25)
Globulin: 2.8 g/dL (calc) (ref 1.9–3.7)
Glucose, Bld: 98 mg/dL (ref 65–99)
Potassium: 4.5 mmol/L (ref 3.5–5.3)
Sodium: 132 mmol/L — ABNORMAL LOW (ref 135–146)
Total Bilirubin: 0.7 mg/dL (ref 0.2–1.2)
Total Protein: 7.1 g/dL (ref 6.1–8.1)

## 2018-03-05 LAB — LIPID PANEL
Cholesterol: 134 mg/dL (ref <200)
HDL: 85 mg/dL (ref >40)
LDL Cholesterol (Calc): 40 mg/dL (calc)
Non-HDL Cholesterol (Calc): 49 mg/dL (calc) (ref <130)
Total CHOL/HDL Ratio: 1.6 (calc) (ref <5.0)
Triglycerides: 33 mg/dL (ref <150)

## 2018-03-05 LAB — MAGNESIUM: Magnesium: 1.8 mg/dL (ref 1.5–2.5)

## 2018-03-05 LAB — PSA: PSA: 0.6 ng/mL (ref <=4.0)

## 2018-03-07 ENCOUNTER — Other Ambulatory Visit: Payer: Self-pay | Admitting: *Deleted

## 2018-03-07 MED ORDER — ZOSTER VAC RECOMB ADJUVANTED 50 MCG/0.5ML IM SUSR: 0.5000 mL | Freq: Once | INTRAMUSCULAR | 0 refills | Status: AC

## 2018-03-10 ENCOUNTER — Other Ambulatory Visit: Payer: Self-pay | Admitting: Family Medicine

## 2018-05-27 ENCOUNTER — Encounter (HOSPITAL_COMMUNITY): Payer: Self-pay | Admitting: Internal Medicine

## 2018-05-27 ENCOUNTER — Inpatient Hospital Stay (HOSPITAL_COMMUNITY): Payer: Medicare HMO

## 2018-05-27 ENCOUNTER — Inpatient Hospital Stay (HOSPITAL_COMMUNITY): Payer: Medicare HMO | Attending: Internal Medicine | Admitting: Internal Medicine

## 2018-05-27 ENCOUNTER — Other Ambulatory Visit: Payer: Self-pay

## 2018-05-27 VITALS — BP 132/58 | HR 62 | Temp 97.5°F | Resp 16 | Wt 131.0 lb

## 2018-05-27 DIAGNOSIS — D72819 Decreased white blood cell count, unspecified: Secondary | ICD-10-CM | POA: Diagnosis not present

## 2018-05-27 DIAGNOSIS — Z7982 Long term (current) use of aspirin: Secondary | ICD-10-CM | POA: Diagnosis not present

## 2018-05-27 DIAGNOSIS — Z79899 Other long term (current) drug therapy: Secondary | ICD-10-CM | POA: Insufficient documentation

## 2018-05-27 DIAGNOSIS — D708 Other neutropenia: Secondary | ICD-10-CM

## 2018-05-27 DIAGNOSIS — M25559 Pain in unspecified hip: Secondary | ICD-10-CM

## 2018-05-27 DIAGNOSIS — F17210 Nicotine dependence, cigarettes, uncomplicated: Secondary | ICD-10-CM | POA: Diagnosis not present

## 2018-05-27 LAB — COMPREHENSIVE METABOLIC PANEL
ALT: 15 U/L (ref 0–44)
AST: 27 U/L (ref 15–41)
Albumin: 4.4 g/dL (ref 3.5–5.0)
Alkaline Phosphatase: 71 U/L (ref 38–126)
Anion gap: 10 (ref 5–15)
BUN: 5 mg/dL — ABNORMAL LOW (ref 8–23)
CO2: 27 mmol/L (ref 22–32)
Calcium: 9.3 mg/dL (ref 8.9–10.3)
Chloride: 97 mmol/L — ABNORMAL LOW (ref 98–111)
Creatinine, Ser: 0.79 mg/dL (ref 0.61–1.24)
GFR calc Af Amer: >60 mL/min (ref >60)
GFR calc non Af Amer: >60 mL/min (ref >60)
Glucose, Bld: 86 mg/dL (ref 70–99)
Potassium: 3.8 mmol/L (ref 3.5–5.1)
Sodium: 134 mmol/L — ABNORMAL LOW (ref 135–145)
Total Bilirubin: 0.7 mg/dL (ref 0.3–1.2)
Total Protein: 7.8 g/dL (ref 6.5–8.1)

## 2018-05-27 LAB — CBC WITH DIFFERENTIAL/PLATELET
Abs Immature Granulocytes: 0.04 10*3/uL (ref 0.00–0.07)
Basophils Absolute: 0.1 10*3/uL (ref 0.0–0.1)
Basophils Relative: 2 %
Eosinophils Absolute: 0 10*3/uL (ref 0.0–0.5)
Eosinophils Relative: 1 %
HCT: 42.9 % (ref 39.0–52.0)
Hemoglobin: 14 g/dL (ref 13.0–17.0)
Immature Granulocytes: 1 %
Lymphocytes Relative: 36 %
Lymphs Abs: 1.1 10*3/uL (ref 0.7–4.0)
MCH: 32 pg (ref 26.0–34.0)
MCHC: 32.6 g/dL (ref 30.0–36.0)
MCV: 97.9 fL (ref 80.0–100.0)
Monocytes Absolute: 0.2 10*3/uL (ref 0.1–1.0)
Monocytes Relative: 7 %
Neutro Abs: 1.7 10*3/uL (ref 1.7–7.7)
Neutrophils Relative %: 53 %
Platelets: 349 10*3/uL (ref 150–400)
RBC: 4.38 MIL/uL (ref 4.22–5.81)
RDW: 13.4 % (ref 11.5–15.5)
WBC: 3.2 10*3/uL — ABNORMAL LOW (ref 4.0–10.5)
nRBC: 0 % (ref 0.0–0.2)

## 2018-05-27 LAB — LACTATE DEHYDROGENASE: LDH: 167 U/L (ref 98–192)

## 2018-05-27 LAB — SEDIMENTATION RATE: Sed Rate: 3 mm/hr (ref 0–16)

## 2018-05-27 LAB — C-REACTIVE PROTEIN: CRP: <0.8 mg/dL (ref <1.0)

## 2018-05-27 NOTE — Progress Notes (Signed)
Diagnosis Other neutropenia (HCC) - Plan: CBC with Differential/Platelet, Comprehensive metabolic panel, Lactate dehydrogenase, CBC with Differential/Platelet, Comprehensive metabolic panel, Lactate dehydrogenase  Staging Cancer Staging No matching staging information was found for the patient.  Assessment and Plan:   1.  Leukopenia.  Labs done 05/27/2018 reviewed and showed WBC 3.2 HB 14 plts 349,000.   Chemistries WNL with K+ 3,8 Cr 0,79 and normal LFTs.  He has undergone hepatitis and HIV evaluation in the past that were negative on labs done 05/04/2017.  He underwent an abdominal ultrasound in September 2018 that showed no hepatosplenomegaly.  WBC is improved today at 3.2.  Suspect normal variant.  Pt will RTC in 11/2018 for repeat labs.  He will be seen for follow-up in 05/2019.   2.  Smoking.  Cessation recommended.  Patient is followed by the lung cancer screening clinic.  CT chest done 01/20/2017 showed evidence of COPD and atherosclerosis.  Follow-up with lung cancer screening clinic as directed.   3.  Chronic alcohol use.  LFTs WNL.  Follow-up with PCP for help with options.    Interval History:  Historical data obtained from note dated 11/25/2017.  67 y.o. Male previously followed by Dr. Talbert Cage.  Blood work performed on 03/02/2017 which demonstrated WBC 3.1K, hemoglobin 13.8 g/dL, hematocrit 40.6%, plate count 007M, differential demonstrated an [place] 1457 otherwise differential was normal. Repeat CBC on 03/18/17 demonstrated WBC 3.1K, hemoglobin14 g/dL, hematocrit 41.2%, platelet count 328K, differential demonstrated an [place] 1333, otherwise differential normal. Baseline WBC from 07/17/2015 was 4.6K. WBC from 02/13/2015 was 3.7K. 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  Rare atypical lymphs. No immature cells are identified. Normocytic/Normochromic anemia with  unremarkable RBC morphology. Platelets are unremarkable.   Current Status: Pt seen today for follow-up.  He is here today to go over lab studies.  Problem List Patient Active Problem List   Diagnosis Date Noted  . Heavy alcohol use [Z78.9] 03/04/2018  . Memory changes [R41.3] 03/04/2018  . Leukopenia [D72.819] 05/04/2017  . Bruit [R09.89] 01/22/2014  . Muscle spasm of back [M62.830] 09/20/2013  . COPD (chronic obstructive pulmonary disease) ([place]) [J44.9] 01/14/2011  . CAD (coronary artery disease) [I25.10] 01/14/2011  . GERD (gastroesophageal reflux disease) [K21.9] 01/14/2011  . TOBACCO ABUSE [F17.200] 07/25/2009  . Osteoarthritis [M19.90] 07/25/2009  . ROTATOR CUFF SYNDROME, RIGHT [M71.9, M67.919] 07/25/2009    Past Medical History Past Medical History:  Diagnosis Date  . COPD (chronic obstructive pulmonary disease) ([place])   . DJD (degenerative joint disease)    diffusely  . GERD (gastroesophageal reflux disease)   . Hyperlipidemia   . MI (myocardial infarction) ([place]) 2006   small MI and stents x 3  Dr. Doreatha Lew  . PUD (peptic ulcer disease)    with bleeding  . Rotator cuff disorder    has been evaluated by Dr Clifton James and Koleen Distance    Past Surgical History Past Surgical History:  Procedure Laterality Date  . COLONOSCOPY    . EYE SURGERY     right eye removed at age 44  . heart stent     x3- 2006    Family History Family History  Problem Relation Age of Onset  . Ovarian cancer Mother   . Diabetes Maternal Grandfather   . COPD Father   . Diabetes Paternal Grandfather   . Colon cancer Paternal Grandfather   . Esophageal cancer Neg Hx   . Rectal cancer  Neg Hx   . Stomach cancer Neg Hx      Social History  reports that he has been smoking cigarettes. He has a 40.00 pack-year smoking history. He has never used smokeless tobacco. He reports that he drinks about 30.0 standard drinks of alcohol per week. He reports that he does not use  drugs.  Medications  Current Outpatient Medications:  .  aspirin 81 MG tablet, Take 81 mg by mouth daily.  , Disp: , Rfl:  .  celecoxib (CELEBREX) 100 MG capsule, Take 1 capsule (100 mg total) by mouth 2 (two) times daily., Disp: 60 capsule, Rfl: 60 .  fluticasone (FLONASE) 50 MCG/ACT nasal spray, USE 2 SPRAYS IN EACH NOSTRIL EVERY DAY, Disp: 16 g, Rfl: 6 .  Glucosamine HCl (GLUCOSAMINE PO), Take 1,500 mg by mouth daily., Disp: , Rfl:  .  NITROSTAT 0.4 MG SL tablet, dissolve 1 TABLET UNDER THE TONGUE every 5 minutes AS NEEDED CHEST PAIN, Disp: 25 tablet, Rfl: 0 .  omeprazole (PRILOSEC) 40 MG capsule, TAKE 1 CAPSULE BY MOUTH EVERY DAY, Disp: 90 capsule, Rfl: 3 .  simvastatin (ZOCOR) 40 MG tablet, TAKE 1 TABLET BY MOUTH AT BEDTIME, Disp: 30 tablet, Rfl: 3 .  SYMBICORT 160-4.5 MCG/ACT inhaler, inhale 2 PUFFS into THE lungs 2 TIMES DAILY, Disp: 10.2 g, Rfl: 11 .  tiotropium (SPIRIVA) 18 MCG inhalation capsule, Place 1 capsule (18 mcg total) into inhaler and inhale daily., Disp: 90 capsule, Rfl: 3 .  tiZANidine (ZANAFLEX) 4 MG tablet, Take 1 tablet (4 mg total) by mouth every 8 (eight) hours as needed for muscle spasms., Disp: 30 tablet, Rfl: 1 .  traMADol (ULTRAM) 50 MG tablet, Take 1 tablet (50 mg total) by mouth every 6 (six) hours as needed for moderate pain., Disp: 30 tablet, Rfl: 0 .  VENTOLIN HFA 108 (90 Base) MCG/ACT inhaler, INHALE 2 PUFFS into THE lungs EVERY 6 HOURS AS NEEDED FOR WHEEZING OR SHORTNESS OF BREATH, Disp: 18 g, Rfl: 3  Allergies Patient has no known allergies.  Review of Systems Review of Systems - Oncology ROS negative  Physical Exam  Vitals Wt Readings from Last 3 Encounters:  05/27/18 131 lb (59.4 kg)  03/04/18 134 lb (60.8 kg)  12/16/17 131 lb (59.4 kg)   Temp Readings from Last 3 Encounters:  05/27/18 (!) 97.5 F (36.4 C) (Oral)  03/04/18 97.9 F (36.6 C) (Oral)  09/03/17 97.9 F (36.6 C) (Oral)   BP Readings from Last 3 Encounters:  05/27/18 (!)  132/58  03/04/18 130/68  12/16/17 118/70   Pulse Readings from Last 3 Encounters:  05/27/18 62  03/04/18 76  12/16/17 78    Constitutional: Well-developed, well-nourished, and in no distress.   HENT: Head: Normocephalic and atraumatic.  Mouth/Throat: No oropharyngeal exudate. Mucosa moist. Eyes: Pupils are equal, round, and reactive to light. Conjunctivae are normal. No scleral icterus.  Neck: Normal range of motion. Neck supple. No JVD present.  Cardiovascular: Normal rate, regular rhythm and normal heart sounds.  Exam reveals no gallop and no friction rub.   No murmur heard. Pulmonary/Chest: Effort normal and breath sounds normal. No respiratory distress. No wheezes.No rales.  Abdominal: Soft. Bowel sounds are normal. No distension. There is no tenderness. There is no guarding.  Musculoskeletal: No edema or tenderness.  Lymphadenopathy: No cervical, axillary or supraclavicular adenopathy.  Neurological: Alert and oriented to person, place, and time. No cranial nerve deficit.  Skin: Skin is warm and dry. No rash noted. No erythema. No pallor.  Psychiatric: Affect and judgment normal.   Labs Appointment on 05/27/2018  Component Date Value Ref Range Status  . WBC 05/27/2018 3.2* 4.0 - 10.5 K/uL Final  . RBC 05/27/2018 4.38  4.22 - 5.81 MIL/uL Final  . Hemoglobin 05/27/2018 14.0  13.0 - 17.0 g/dL Final  . HCT 05/27/2018 42.9  39.0 - 52.0 % Final  . MCV 05/27/2018 97.9  80.0 - 100.0 fL Final  . MCH 05/27/2018 32.0  26.0 - 34.0 pg Final  . MCHC 05/27/2018 32.6  30.0 - 36.0 g/dL Final  . RDW 05/27/2018 13.4  11.5 - 15.5 % Final  . Platelets 05/27/2018 349  150 - 400 K/uL Final  . nRBC 05/27/2018 0.0  0.0 - 0.2 % Final  . Neutrophils Relative % 05/27/2018 53  % Final  . Neutro Abs 05/27/2018 1.7  1.7 - 7.7 K/uL Final  . Lymphocytes Relative 05/27/2018 36  % Final  . Lymphs Abs 05/27/2018 1.1  0.7 - 4.0 K/uL Final  . Monocytes Relative 05/27/2018 7  % Final  . Monocytes Absolute  05/27/2018 0.2  0.1 - 1.0 K/uL Final  . Eosinophils Relative 05/27/2018 1  % Final  . Eosinophils Absolute 05/27/2018 0.0  0.0 - 0.5 K/uL Final  . Basophils Relative 05/27/2018 2  % Final  . Basophils Absolute 05/27/2018 0.1  0.0 - 0.1 K/uL Final  . Immature Granulocytes 05/27/2018 1  % Final  . Abs Immature Granulocytes 05/27/2018 0.04  0.00 - 0.07 K/uL Final   Performed at [facility], [address], [place], [place] [address]  . Sodium 05/27/2018 134* 135 - 145 mmol/L Final  . Potassium 05/27/2018 3.8  3.5 - 5.1 mmol/L Final  . Chloride 05/27/2018 97* 98 - 111 mmol/L Final  . CO2 05/27/2018 27  22 - 32 mmol/L Final  . Glucose, Bld 05/27/2018 86  70 - 99 mg/dL Final  . BUN 05/27/2018 5* 8 - 23 mg/dL Final  . Creatinine, Ser 05/27/2018 0.79  0.61 - 1.24 mg/dL Final  . Calcium 05/27/2018 9.3  8.9 - 10.3 mg/dL Final  . Total Protein 05/27/2018 7.8  6.5 - 8.1 g/dL Final  . Albumin 05/27/2018 4.4  3.5 - 5.0 g/dL Final  . AST 05/27/2018 27  15 - 41 U/L Final  . ALT 05/27/2018 15  0 - 44 U/L Final  . Alkaline Phosphatase 05/27/2018 71  38 - 126 U/L Final  . Total Bilirubin 05/27/2018 0.7  0.3 - 1.2 mg/dL Final  . GFR calc non Af Amer 05/27/2018 >60  >60 mL/min Final  . GFR calc Af Amer 05/27/2018 >60  >60 mL/min Final   Comment: (NOTE) The eGFR has been calculated using the CKD EPI equation. This calculation has not been validated in all clinical situations. eGFR's persistently <60 mL/min signify possible Chronic Kidney Disease.   Georgiann Hahn gap 05/27/2018 10  5 - 15 Final   Performed at [facility], [address] [place], [place] [address]  . LDH 05/27/2018 167  98 - 192 U/L Final   Performed at [facility], [address], [place], [place] [address]     Pathology Orders Placed This Encounter  Procedures  . CBC with Differential/Platelet    Standing Status:   Future    Standing Expiration Date:   05/27/2020  . Comprehensive metabolic panel    Standing Status:   Future     Standing Expiration Date:   05/27/2020  . Lactate dehydrogenase    Standing Status:   Future  Standing Expiration Date:   05/27/2020  . CBC with Differential/Platelet    Standing Status:   Future    Standing Expiration Date:   05/27/2020  . Comprehensive metabolic panel    Standing Status:   Future    Standing Expiration Date:   05/27/2020  . Lactate dehydrogenase    Standing Status:   Future    Standing Expiration Date:   05/27/2020       Zoila Shutter MD

## 2018-05-28 LAB — RHEUMATOID FACTOR: Rhuematoid fact SerPl-aCnc: 10.5 IU/mL (ref 0.0–13.9)

## 2018-05-30 LAB — PROTEIN ELECTROPHORESIS, SERUM
A/G Ratio: 1.4 (ref 0.7–1.7)
Albumin ELP: 4.2 g/dL (ref 2.9–4.4)
Alpha-1-Globulin: 0.3 g/dL (ref 0.0–0.4)
Alpha-2-Globulin: 0.6 g/dL (ref 0.4–1.0)
Beta Globulin: 0.9 g/dL (ref 0.7–1.3)
Gamma Globulin: 1.2 g/dL (ref 0.4–1.8)
Globulin, Total: 2.9 g/dL (ref 2.2–3.9)
Total Protein ELP: 7.1 g/dL (ref 6.0–8.5)

## 2018-05-30 LAB — ANTINUCLEAR ANTIBODIES, IFA: ANA Ab, IFA: NEGATIVE

## 2018-06-06 ENCOUNTER — Other Ambulatory Visit: Payer: Self-pay | Admitting: *Deleted

## 2018-06-06 DIAGNOSIS — J418 Mixed simple and mucopurulent chronic bronchitis: Secondary | ICD-10-CM

## 2018-06-06 MED ORDER — SIMVASTATIN 40 MG PO TABS: 40.0000 mg | ORAL_TABLET | Freq: Every day | ORAL | 3 refills | Status: DC

## 2018-06-06 MED ORDER — OMEPRAZOLE 40 MG PO CPDR: DELAYED_RELEASE_CAPSULE | ORAL | 3 refills | Status: DC

## 2018-06-06 MED ORDER — BUDESONIDE-FORMOTEROL FUMARATE 160-4.5 MCG/ACT IN AERO: INHALATION_SPRAY | RESPIRATORY_TRACT | 3 refills | Status: DC

## 2018-06-06 MED ORDER — TIZANIDINE HCL 4 MG PO TABS: 4.0000 mg | ORAL_TABLET | Freq: Three times a day (TID) | ORAL | 0 refills | Status: DC | PRN

## 2018-06-06 MED ORDER — FLUTICASONE PROPIONATE 50 MCG/ACT NA SUSP: 2.0000 | Freq: Every day | NASAL | 3 refills | Status: DC

## 2018-06-06 MED ORDER — TIOTROPIUM BROMIDE MONOHYDRATE 18 MCG IN CAPS: 18.0000 ug | ORAL_CAPSULE | Freq: Every day | RESPIRATORY_TRACT | 3 refills | Status: DC

## 2018-06-06 MED ORDER — CELECOXIB 100 MG PO CAPS: 100.0000 mg | ORAL_CAPSULE | Freq: Two times a day (BID) | ORAL | 3 refills | Status: DC

## 2018-06-06 MED ORDER — ALBUTEROL SULFATE HFA 108 (90 BASE) MCG/ACT IN AERS: INHALATION_SPRAY | RESPIRATORY_TRACT | 3 refills | Status: DC

## 2018-06-06 NOTE — Telephone Encounter (Signed)
Received fax requesting refill on Zanaflex to mail order.   Ok to refill?

## 2018-06-08 ENCOUNTER — Other Ambulatory Visit: Payer: Self-pay | Admitting: Family Medicine

## 2018-06-15 ENCOUNTER — Other Ambulatory Visit: Payer: Self-pay | Admitting: Family Medicine

## 2018-07-06 ENCOUNTER — Other Ambulatory Visit: Payer: Self-pay | Admitting: Family Medicine

## 2018-07-06 NOTE — Telephone Encounter (Signed)
Ok to refill 

## 2018-08-03 ENCOUNTER — Ambulatory Visit: Payer: Medicare HMO | Admitting: Emergency Medicine

## 2018-08-05 ENCOUNTER — Ambulatory Visit (INDEPENDENT_AMBULATORY_CARE_PROVIDER_SITE_OTHER): Payer: Medicare HMO | Admitting: Family Medicine

## 2018-08-05 ENCOUNTER — Encounter: Payer: Self-pay | Admitting: Family Medicine

## 2018-08-05 ENCOUNTER — Ambulatory Visit: Payer: Medicare HMO | Admitting: Family Medicine

## 2018-08-05 VITALS — BP 118/64 | HR 64 | Temp 97.6°F | Ht 68.0 in | Wt 131.5 lb

## 2018-08-05 DIAGNOSIS — J449 Chronic obstructive pulmonary disease, unspecified: Secondary | ICD-10-CM

## 2018-08-05 DIAGNOSIS — I25110 Atherosclerotic heart disease of native coronary artery with unstable angina pectoris: Secondary | ICD-10-CM | POA: Diagnosis not present

## 2018-08-05 DIAGNOSIS — K219 Gastro-esophageal reflux disease without esophagitis: Secondary | ICD-10-CM | POA: Diagnosis not present

## 2018-08-05 DIAGNOSIS — M62838 Other muscle spasm: Secondary | ICD-10-CM | POA: Diagnosis not present

## 2018-08-05 DIAGNOSIS — F1090 Alcohol use, unspecified, uncomplicated: Secondary | ICD-10-CM

## 2018-08-05 DIAGNOSIS — Z789 Other specified health status: Secondary | ICD-10-CM

## 2018-08-05 DIAGNOSIS — F1010 Alcohol abuse, uncomplicated: Secondary | ICD-10-CM | POA: Diagnosis not present

## 2018-08-05 DIAGNOSIS — F17200 Nicotine dependence, unspecified, uncomplicated: Secondary | ICD-10-CM | POA: Diagnosis not present

## 2018-08-05 DIAGNOSIS — Z23 Encounter for immunization: Secondary | ICD-10-CM

## 2018-08-05 MED ORDER — ALBUTEROL SULFATE HFA 108 (90 BASE) MCG/ACT IN AERS: INHALATION_SPRAY | RESPIRATORY_TRACT | 3 refills | Status: DC

## 2018-08-05 NOTE — Patient Instructions (Addendum)
Continue your meds and follow up with Dr. Buelah Manis in about 4 months  Keep your appointments with cardiology and pulmonology.  We will call you with lab results.  Keep supplementing with magnesium and I would add a multivitamin with the following:  Thiamine 100 mg, pyridoxine 2 mg (Vitamin B6), and folic acid 517 mcg to 1 mg These can be deficient with daily alcohol use and can cause problems with brain function.

## 2018-08-05 NOTE — Progress Notes (Signed)
Patient ID: Zachary Chambers, male    DOB: 11/04/1950, 67 y.o.   MRN: [id number]  PCP: [name], MD  Chief Complaint  Patient presents with  . Coronary Artery Disease    Patient is fasting today. States no concerns this visit.     Subjective:   Zachary Chambers is a 67 y.o. male, presents to clinic with CC of routine f/up on multiple chronic conditions.   CAD - He sees cardiology, on ASA, simvastatin 40 mg he is tolerating medications.  Has SL NG but he says he uses "very rarely" last used maybe a year - no recent CP episodes, sees cardiology coming up in January   COPD - see pulmonology - Dr. Lamonte Sakai, on symbicort, spiriva and has SABA as well, continues to smoke - Currently smokes about 1/2 pack a day and sometimes only smokes half the cigarette  Recent exacerbation - none, he cannot remember the last time he had tx (steroids) for an exacerbation Was scheduled to have pulmonology appt this week but he had to reschedule for Early Jan. He just got refills on some inhalers, but will need rescue inhaler soon.   He says he has tried using it every 6 hours and it does not help much.     Cramps - on flexeril and zanaflex in the past, he says he still has some muscle relaxers and he uses them occasionally, most recent electrolytes were normal, he continues to have cramps occasionally in hands and feet.  Wants to check magnesium.  He says last time he got labs done at the cancer center they would not tell him his magnesium level.  ETOH - daily use, says he is not drinking as much as he used to, down to 3-4 beers a day.  He says the less he drinks the more his memory seems to be problematic and since decreasing alcohol intake he has had no improvement in his cognition or memory  GERD - taking meds as prescribed, rarely needs a second dose and rarely has sx.  Feels tired all the time.  He says "I guess you could call it laziness."  He denies any new or recent weight changes, palpitations, near  syncope, hair/skin/bowel changes. No new exertional sx.   Patient Active Problem List   Diagnosis Date Noted  . Heavy alcohol use 03/04/2018  . Memory changes 03/04/2018  . Leukopenia 05/04/2017  . Bruit 01/22/2014  . Muscle spasm of back 09/20/2013  . COPD (chronic obstructive pulmonary disease) ([place]) 01/14/2011  . CAD (coronary artery disease) 01/14/2011  . GERD (gastroesophageal reflux disease) 01/14/2011  . TOBACCO ABUSE 07/25/2009  . Osteoarthritis 07/25/2009  . ROTATOR CUFF SYNDROME, RIGHT 07/25/2009    Current Meds  Medication Sig  . albuterol (VENTOLIN HFA) 108 (90 Base) MCG/ACT inhaler INHALE 2 PUFFS into THE lungs EVERY 6 HOURS AS NEEDED FOR WHEEZING OR SHORTNESS OF BREATH  . aspirin 81 MG tablet Take 81 mg by mouth daily.    . budesonide-formoterol (SYMBICORT) 160-4.5 MCG/ACT inhaler inhale 2 PUFFS into THE lungs 2 TIMES DAILY  . celecoxib (CELEBREX) 100 MG capsule Take 1 capsule (100 mg total) by mouth 2 (two) times daily.  . fluticasone (FLONASE) 50 MCG/ACT nasal spray Place 2 sprays into both nostrils daily.  . Glucosamine HCl (GLUCOSAMINE PO) Take 1,500 mg by mouth daily.  Marland Kitchen NITROSTAT 0.4 MG SL tablet dissolve 1 TABLET UNDER THE TONGUE every 5 minutes AS NEEDED CHEST PAIN  . omeprazole (PRILOSEC)  40 MG capsule TAKE 1 CAPSULE BY MOUTH EVERY DAY  . simvastatin (ZOCOR) 40 MG tablet Take 1 tablet (40 mg total) by mouth at bedtime.  . simvastatin (ZOCOR) 40 MG tablet TAKE 1 TABLET BY MOUTH AT BEDTIME  . tiotropium (SPIRIVA) 18 MCG inhalation capsule Place 1 capsule (18 mcg total) into inhaler and inhale daily.  Marland Kitchen tiZANidine (ZANAFLEX) 4 MG tablet TAKE 1 TABLET EVERY 8 HOURS AS NEEDED FOR MUSCLE SPASMS  . traMADol (ULTRAM) 50 MG tablet Take 1 tablet (50 mg total) by mouth every 6 (six) hours as needed for moderate pain.  . VENTOLIN HFA 108 (90 Base) MCG/ACT inhaler INHALE 2 PUFFS into THE lungs EVERY 6 HOURS AS NEEDED FOR WHEEZING OR SHORTNESS OF BREATH     Review of  Systems  Constitutional: Negative.   HENT: Negative.   Eyes: Negative.   Respiratory: Negative.   Cardiovascular: Negative.   Gastrointestinal: Negative.   Endocrine: Negative.   Genitourinary: Negative.   Musculoskeletal: Negative.   Skin: Negative.   Allergic/Immunologic: Negative.   Neurological: Negative.   Hematological: Negative.   Psychiatric/Behavioral: Negative.   All other systems reviewed and are negative.      Objective:    Vitals:   08/05/18 1117  BP: 118/64  Pulse: 64  Temp: 97.6 F (36.4 C)  TempSrc: Oral  SpO2: 96%  Weight: 131 lb 8 oz (59.6 kg)  Height: 5\' 8"  (1.727 m)      Physical Exam Vitals signs and nursing note reviewed.  Constitutional:      General: He is not in acute distress.    Appearance: He is well-developed. He is not toxic-appearing or diaphoretic.     Comments: Chronically ill-appearing male, appears older than stated age no acute distress  HENT:     Head: Normocephalic and atraumatic.     Right Ear: External ear normal.     Left Ear: External ear normal.     Nose: Nose normal. No congestion or rhinorrhea.     Mouth/Throat:     Mouth: Mucous membranes are moist.     Pharynx: Oropharynx is clear. No posterior oropharyngeal erythema.  Eyes:     General: No scleral icterus.       Right eye: No discharge.        Left eye: No discharge.     Conjunctiva/sclera: Conjunctivae normal.     Pupils: Pupils are equal, round, and reactive to light.  Neck:     Musculoskeletal: Normal range of motion.     Trachea: No tracheal deviation.  Cardiovascular:     Rate and Rhythm: Normal rate and regular rhythm.     Pulses: Normal pulses.          Radial pulses are 2+ on the right side and 2+ on the left side.     Heart sounds: Heart sounds are distant. No murmur. No friction rub. No gallop.   Pulmonary:     Effort: Pulmonary effort is normal. No accessory muscle usage, respiratory distress or retractions.     Breath sounds: No stridor.  Decreased breath sounds (throughout all lung fields) present. No wheezing, rhonchi or rales.  Chest:     Chest wall: No tenderness.  Abdominal:     General: Abdomen is flat. Bowel sounds are normal. There is no distension.     Palpations: Abdomen is soft.     Tenderness: There is no abdominal tenderness.  Musculoskeletal: Normal range of motion.  Skin:    General: Skin is  warm and dry.     Capillary Refill: Capillary refill takes less than 2 seconds.     Coloration: Skin is not jaundiced.     Findings: No rash.     Nails: There is clubbing.  Neurological:     Mental Status: He is alert.     Motor: No abnormal muscle tone.     Coordination: Coordination normal.  Psychiatric:        Behavior: Behavior normal.           Assessment & Plan:   Patient is a 67 year old male past medical history of coronary artery disease, COPD, current smoker is here for routine follow-up: No acute complaints.   Problem List Items Addressed This Visit      Cardiovascular and Mediastinum   CAD (coronary artery disease) - Primary    Managed by cardiology, no CP episodes in the past year Sees cardiology in a few weeks Compliant with ASA and statin, no SE, recheck lipid and CMP today      Relevant Orders   COMPLETE METABOLIC PANEL WITH GFR   Lipid panel     Respiratory   COPD (chronic obstructive pulmonary disease) ([place])    Patient denies any worsening of his COPD, baseline cough and shortness of breath, he has cut back a little bit on smoking, states he is compliant with inhalers and has follow-up with pulmonology in a couple weeks.  No recent exacerbation, rescue inhaler refilled for him Encouraged him to continue to cut back number of cigarettes smoked per day Symbicort inhaler samples given to him      Relevant Medications   albuterol (VENTOLIN HFA) 108 (90 Base) MCG/ACT inhaler   Other Relevant Orders   CBC with Differential     Digestive   GERD (gastroesophageal reflux disease)     On meds, tolerating w/o sx, is not having symptoms, well controlled        Other   TOBACCO ABUSE    Continues to smoke about half a pack per day but states he does not smoke the entire cigarette every time, encouraged to continue to decrease smoking, he reports he did not tolerate any Nicorette gum or nicotine patches in the past      Relevant Orders   CBC with Differential   Heavy alcohol use    He reports he has decreased his daily alcohol intake to 3-4 beers per day Discussed supplementation for nutritional deficiency secondary to heavy alcohol use       Other Visit Diagnoses    Muscle spasm       Relevant Orders   COMPLETE METABOLIC PANEL WITH GFR   Magnesium   ETOH abuse       Relevant Orders   CBC with Differential      Most conditions appear very stable, continue medications, encouraged to continue to cut back on smoking and drinking, will contact patient with lab results, he was encouraged to keep his follow-up with pulmonology and cardiology.    Routine follow-up with his PCP in 4-5 months.  Delsa Grana, PA-C 08/05/18 11:31 AM

## 2018-08-05 NOTE — Assessment & Plan Note (Addendum)
Managed by cardiology, no CP episodes in the past year Sees cardiology in a few weeks Compliant with ASA and statin, no SE, recheck lipid and CMP today

## 2018-08-05 NOTE — Assessment & Plan Note (Signed)
Continues to smoke about half a pack per day but states he does not smoke the entire cigarette every time, encouraged to continue to decrease smoking, he reports he did not tolerate any Nicorette gum or nicotine patches in the past

## 2018-08-05 NOTE — Assessment & Plan Note (Signed)
He reports he has decreased his daily alcohol intake to 3-4 beers per day Discussed supplementation for nutritional deficiency secondary to heavy alcohol use

## 2018-08-05 NOTE — Assessment & Plan Note (Signed)
On meds, tolerating w/o sx, is not having symptoms, well controlled

## 2018-08-05 NOTE — Assessment & Plan Note (Addendum)
Patient denies any worsening of his COPD, baseline cough and shortness of breath, he has cut back a little bit on smoking, states he is compliant with inhalers and has follow-up with pulmonology in a couple weeks.  No recent exacerbation, rescue inhaler refilled for him Encouraged him to continue to cut back number of cigarettes smoked per day Symbicort inhaler samples given to him

## 2018-08-05 NOTE — Addendum Note (Signed)
Addended by: Ofilia Neas R on: 08/05/2018 12:11 PM   Modules accepted: Orders

## 2018-08-06 LAB — CBC WITH DIFFERENTIAL/PLATELET
Absolute Monocytes: 413 cells/uL (ref 200–950)
Basophils Absolute: 51 cells/uL (ref 0–200)
Basophils Relative: 1.3 %
Eosinophils Absolute: 12 cells/uL — ABNORMAL LOW (ref 15–500)
Eosinophils Relative: 0.3 %
HCT: 41.7 % (ref 38.5–50.0)
Hemoglobin: 14.3 g/dL (ref 13.2–17.1)
Lymphs Abs: 1065 cells/uL (ref 850–3900)
MCH: 32.5 pg (ref 27.0–33.0)
MCHC: 34.3 g/dL (ref 32.0–36.0)
MCV: 94.8 fL (ref 80.0–100.0)
MPV: 9.6 fL (ref 7.5–12.5)
Monocytes Relative: 10.6 %
Neutro Abs: 2360 cells/uL (ref 1500–7800)
Neutrophils Relative %: 60.5 %
Platelets: 372 10*3/uL (ref 140–400)
RBC: 4.4 10*6/uL (ref 4.20–5.80)
RDW: 12.8 % (ref 11.0–15.0)
Total Lymphocyte: 27.3 %
WBC: 3.9 10*3/uL (ref 3.8–10.8)

## 2018-08-06 LAB — COMPLETE METABOLIC PANEL WITH GFR
AG Ratio: 1.6 (calc) (ref 1.0–2.5)
ALT: 11 U/L (ref 9–46)
AST: 24 U/L (ref 10–35)
Albumin: 4.5 g/dL (ref 3.6–5.1)
Alkaline phosphatase (APISO): 78 U/L (ref 40–115)
BUN: 9 mg/dL (ref 7–25)
CO2: 29 mmol/L (ref 20–32)
Calcium: 9.8 mg/dL (ref 8.6–10.3)
Chloride: 94 mmol/L — ABNORMAL LOW (ref 98–110)
Creat: 0.82 mg/dL (ref 0.70–1.25)
GFR, Est African American: 105 mL/min/{1.73_m2} (ref >=60)
GFR, Est Non African American: 90 mL/min/{1.73_m2} (ref >=60)
Globulin: 2.9 g/dL (calc) (ref 1.9–3.7)
Glucose, Bld: 84 mg/dL (ref 65–99)
Potassium: 4.4 mmol/L (ref 3.5–5.3)
Sodium: 131 mmol/L — ABNORMAL LOW (ref 135–146)
Total Bilirubin: 0.6 mg/dL (ref 0.2–1.2)
Total Protein: 7.4 g/dL (ref 6.1–8.1)

## 2018-08-06 LAB — LIPID PANEL
Cholesterol: 143 mg/dL (ref <200)
HDL: 80 mg/dL (ref >40)
LDL Cholesterol (Calc): 53 mg/dL (calc)
Non-HDL Cholesterol (Calc): 63 mg/dL (calc) (ref <130)
Total CHOL/HDL Ratio: 1.8 (calc) (ref <5.0)
Triglycerides: 35 mg/dL (ref <150)

## 2018-08-06 LAB — MAGNESIUM: Magnesium: 2 mg/dL (ref 1.5–2.5)

## 2018-08-18 ENCOUNTER — Other Ambulatory Visit: Payer: Self-pay | Admitting: Cardiovascular Disease

## 2018-08-18 ENCOUNTER — Telehealth: Payer: Self-pay

## 2018-08-18 DIAGNOSIS — I6523 Occlusion and stenosis of bilateral carotid arteries: Secondary | ICD-10-CM

## 2018-08-18 DIAGNOSIS — I2510 Atherosclerotic heart disease of native coronary artery without angina pectoris: Secondary | ICD-10-CM

## 2018-08-18 NOTE — Telephone Encounter (Signed)
Ordered placed for carotid. Will send message to [facility] to call patient to schedule.

## 2018-08-18 NOTE — Telephone Encounter (Addendum)
-----   Message from Michaelyn Barter, RN sent at 08/24/2016  5:42 PM EST ----- Needs carotid in January 2020

## 2018-08-19 ENCOUNTER — Ambulatory Visit (HOSPITAL_COMMUNITY)
Admission: RE | Admit: 2018-08-19 | Discharge: 2018-08-19 | Disposition: A | Discharge status: Home / Self Care | Payer: Medicare HMO | Source: Ambulatory Visit | Attending: Cardiovascular Disease | Admitting: Cardiovascular Disease

## 2018-08-19 DIAGNOSIS — I6523 Occlusion and stenosis of bilateral carotid arteries: Secondary | ICD-10-CM | POA: Diagnosis not present

## 2018-08-19 DIAGNOSIS — I2510 Atherosclerotic heart disease of native coronary artery without angina pectoris: Secondary | ICD-10-CM | POA: Diagnosis not present

## 2018-08-22 ENCOUNTER — Telehealth: Payer: Self-pay | Admitting: *Deleted

## 2018-08-22 NOTE — Telephone Encounter (Signed)
-----   Message from Josue Hector, MD sent at 08/22/2018  8:55 AM EST ----- Plaque no stenosis f/u carotid duplex in 2 years

## 2018-08-22 NOTE — Telephone Encounter (Signed)
DPR ok to lmom. Left message mild plaque 1/39% B/L, no stenosis, repeat in 2 yrs. Pt has appt on 1/10 @ 9:15 with Dr. [name]. If any questions please call [phone].

## 2018-08-24 NOTE — Progress Notes (Signed)
Patient ID: Zachary Chambers, male   DOB: Mar 13, 1951, 68 y.o.   MRN: [id number]     68 y.o. history of CAD with stents to circumflex and most recently LAD in 2003 Still smoking Lung cancer screening CT negative June 2018.  Sees Dr Lamonte Sakai for his COPD Last carotid 08/19/18 with plaque no stenosis Also drinks 3-4 beers/day with some early cognitive decline   Sad about his dad's dementia. Going to see him in [place] [facility] [place] this weekend "He's my best friend"  Chronic dyspnea from lung disease Has seen [facility] but f/u CT not ordered No angina    ROS: Denies fever, malais, weight loss, blurry vision, decreased visual acuity, cough, sputum, SOB, hemoptysis, pleuritic pain, palpitaitons, heartburn, abdominal pain, melena, lower extremity edema, claudication, or rash.  All other systems reviewed and negative  General: Affect appropriate Chronically ill male  HEENT: normal Neck supple with no adenopathy JVP normal no bruits no thyromegaly Lungs exp wheezing and good diaphragmatic motion Heart:  S1/S2 no murmur, no rub, gallop or click PMI normal Abdomen: benighn, BS positve, no tenderness, no AAA no bruit.  No HSM or HJR Distal pulses intact with no bruits No edema Neuro non-focal Skin chronic bruising both arms  No muscular weakness    Current Outpatient Medications  Medication Sig Dispense Refill  . albuterol (VENTOLIN HFA) 108 (90 Base) MCG/ACT inhaler INHALE 2 PUFFS into THE lungs EVERY 6 HOURS AS NEEDED FOR WHEEZING OR SHORTNESS OF BREATH 18 g 3  . aspirin 81 MG tablet Take 81 mg by mouth daily.      . celecoxib (CELEBREX) 100 MG capsule Take 1 capsule (100 mg total) by mouth 2 (two) times daily. 180 capsule 3  . fluticasone (FLONASE) 50 MCG/ACT nasal spray Place 2 sprays into both nostrils daily. 48 g 3  . Fluticasone-Umeclidin-Vilant (TRELEGY ELLIPTA) 100-62.5-25 MCG/INH AEPB Inhale 1 puff into the lungs daily. 28 each 0  . Glucosamine HCl (GLUCOSAMINE PO) Take 1,500 mg by mouth daily.    Marland Kitchen  NITROSTAT 0.4 MG SL tablet dissolve 1 TABLET UNDER THE TONGUE every 5 minutes AS NEEDED CHEST PAIN 25 tablet 0  . omeprazole (PRILOSEC) 40 MG capsule TAKE 1 CAPSULE BY MOUTH EVERY DAY 90 capsule 3  . simvastatin (ZOCOR) 40 MG tablet Take 1 tablet (40 mg total) by mouth at bedtime. 90 tablet 3  . tiZANidine (ZANAFLEX) 4 MG tablet TAKE 1 TABLET EVERY 8 HOURS AS NEEDED FOR MUSCLE SPASMS 90 tablet 0  . traMADol (ULTRAM) 50 MG tablet Take 1 tablet (50 mg total) by mouth every 6 (six) hours as needed for moderate pain. 30 tablet 0  . budesonide-formoterol (SYMBICORT) 160-4.5 MCG/ACT inhaler inhale 2 PUFFS into THE lungs 2 TIMES DAILY (Patient not taking: Reported on 08/26/2018) 3 Inhaler 3  . tiotropium (SPIRIVA) 18 MCG inhalation capsule Place 1 capsule (18 mcg total) into inhaler and inhale daily. (Patient not taking: Reported on 08/26/2018) 90 capsule 3   No current facility-administered medications for this visit.     Allergies  Patient has no known allergies.  Electrocardiogram:  08/26/18 SR rate 64 normal   Assessment and Plan  CAD: Stable with no angina and good activity level.  Continue medical Rx Stent to LAD/Circumflex last 2003   Smoking: COPD f/u with [facility] lung cancer screening CT June 2018 moderate emphysema   Bruit:  Plaque no stenosis f/u duplex 2022  AAA:  Palpable  Abdominal aorta no AAA by duplex reviewed 08/24/16  Chol:  Cholesterol is  at goal.  Continue current dose of statin and diet Rx.  No myalgias or side effects.  F/U  LFT's in 6 months. Lab Results  Component Value Date   LDLCALC 53 08/05/2018   GERD:  Refill on prlosec called in low carb diet   ETOH:  Discussed issues with cognitive decline and cirrhosis as well as esophageal cancer risk given concurrent smoking          F/U with me in 6 months   [name]

## 2018-08-25 ENCOUNTER — Ambulatory Visit (INDEPENDENT_AMBULATORY_CARE_PROVIDER_SITE_OTHER): Payer: Medicare HMO | Admitting: Emergency Medicine

## 2018-08-25 ENCOUNTER — Encounter: Payer: Self-pay | Admitting: Emergency Medicine

## 2018-08-25 DIAGNOSIS — F17200 Nicotine dependence, unspecified, uncomplicated: Secondary | ICD-10-CM | POA: Diagnosis not present

## 2018-08-25 DIAGNOSIS — J449 Chronic obstructive pulmonary disease, unspecified: Secondary | ICD-10-CM | POA: Diagnosis not present

## 2018-08-25 MED ORDER — FLUTICASONE-UMECLIDIN-VILANT 100-62.5-25 MCG/INH IN AEPB: 1.0000 | INHALATION_SPRAY | Freq: Every day | RESPIRATORY_TRACT | 0 refills | Status: DC

## 2018-08-25 NOTE — Progress Notes (Signed)
Subjective:    Patient ID: Zachary Chambers, male    DOB: 11/30/1950, 68 y.o.   MRN: [id number]  COPD  He complains of cough, shortness of breath and wheezing. Pertinent negatives include no ear pain, fever, headaches, postnasal drip, rhinorrhea, sneezing, sore throat or trouble swallowing. His past medical history is significant for COPD.    ROV 12/16/17 --this follow-up visit for 68 year old gentleman with a history of COPD, tobacco use.  He has been managed on Spiriva, Symbicort.  He has albuterol available and uses it approximately once daily. He tells me that he had flu sx several months ago. He continues to smoke about 1 pk a day. He has some throat soreness, dry mouth. He doesn't rinse after his inhalers.  Occasional cough, no sputum. No flares, no abx, no pred.   ROV 08/25/18 --68 year old gentleman with continued tobacco use, 1 pack daily, COPD.  We have been managing him on Symbicort and Spiriva.  He reports stable exertional dyspnea, he has to go slower. He is able to ADL, shopping. He has more trouble in extreme cold or heat. Minimal cough, occasional wheeze. He uses albuterol about 3-4x a day. No flares since last time. Flu shot up to date, PNA shot in 2018   No flowsheet data found.  Review of Systems  Constitutional: Negative for fever and unexpected weight change.  HENT: Negative for congestion, dental problem, ear pain, nosebleeds, postnasal drip, rhinorrhea, sinus pressure, sneezing, sore throat and trouble swallowing.   Eyes: Negative for redness and itching.  Respiratory: Positive for cough, shortness of breath and wheezing. Negative for chest tightness.   Cardiovascular: Negative for palpitations and leg swelling.  Gastrointestinal: Negative for nausea and vomiting.  Genitourinary: Negative for dysuria.  Musculoskeletal: Negative for joint swelling.  Skin: Negative for rash.  Neurological: Negative for headaches.  Hematological: Bruises/bleeds easily.   Psychiatric/Behavioral: Negative for dysphoric mood. The patient is not nervous/anxious.     No Known Allergies   Outpatient Medications Prior to Visit  Medication Sig Dispense Refill  . albuterol (VENTOLIN HFA) 108 (90 Base) MCG/ACT inhaler INHALE 2 PUFFS into THE lungs EVERY 6 HOURS AS NEEDED FOR WHEEZING OR SHORTNESS OF BREATH 18 g 3  . aspirin 81 MG tablet Take 81 mg by mouth daily.      . budesonide-formoterol (SYMBICORT) 160-4.5 MCG/ACT inhaler inhale 2 PUFFS into THE lungs 2 TIMES DAILY 3 Inhaler 3  . celecoxib (CELEBREX) 100 MG capsule Take 1 capsule (100 mg total) by mouth 2 (two) times daily. 180 capsule 3  . fluticasone (FLONASE) 50 MCG/ACT nasal spray Place 2 sprays into both nostrils daily. 48 g 3  . Glucosamine HCl (GLUCOSAMINE PO) Take 1,500 mg by mouth daily.    Marland Kitchen NITROSTAT 0.4 MG SL tablet dissolve 1 TABLET UNDER THE TONGUE every 5 minutes AS NEEDED CHEST PAIN 25 tablet 0  . omeprazole (PRILOSEC) 40 MG capsule TAKE 1 CAPSULE BY MOUTH EVERY DAY 90 capsule 3  . simvastatin (ZOCOR) 40 MG tablet Take 1 tablet (40 mg total) by mouth at bedtime. 90 tablet 3  . simvastatin (ZOCOR) 40 MG tablet TAKE 1 TABLET BY MOUTH AT BEDTIME 30 tablet 3  . tiotropium (SPIRIVA) 18 MCG inhalation capsule Place 1 capsule (18 mcg total) into inhaler and inhale daily. 90 capsule 3  . tiZANidine (ZANAFLEX) 4 MG tablet TAKE 1 TABLET EVERY 8 HOURS AS NEEDED FOR MUSCLE SPASMS 90 tablet 0  . traMADol (ULTRAM) 50 MG tablet Take 1 tablet (50 mg  total) by mouth every 6 (six) hours as needed for moderate pain. 30 tablet 0  . albuterol (VENTOLIN HFA) 108 (90 Base) MCG/ACT inhaler INHALE 2 PUFFS into THE lungs EVERY 6 HOURS AS NEEDED FOR WHEEZING OR SHORTNESS OF BREATH 3 Inhaler 3   No facility-administered medications prior to visit.          Objective:   Physical Exam Vitals:   08/25/18 0933  BP: 118/66  Pulse: 74  SpO2: 95%  Weight: 131 lb (59.4 kg)  Height: 5\' 8"  (1.727 m)   Gen: Pleasant,  thin elderly man, in no distress,  normal affect  ENT: No lesions,  mouth clear,  oropharynx clear, no postnasal drip  Neck: No JVD, no stridor  Lungs: No use of accessory muscles, distant, clear on normal respiration.  He does have wheeze on forced expiration  Cardiovascular: RRR, heart sounds normal, no murmur or gallops, no peripheral edema  Musculoskeletal: No deformities, no cyanosis or clubbing  Neuro: alert, non focal  Skin: Warm, no lesions or rashes      Assessment & Plan:  COPD (chronic obstructive pulmonary disease) Please stop Symbicort and Spiriva for now. We will do a trial of Trelegy 1 inhalation once daily.  Rinse and gargle after use this medication.  Please call our office to let us know if you benefit from this medicine.  If so then we will order it through your pharmacy Keep albuterol (Ventolin) available to use 2 puffs up to every 4 hours if needed for shortness of breath, chest tightness, wheezing.  Flu shot and pneumonia shot are up-to-dateit. Follow with Dr Lamonte Sakai in 6 months or sooner if you have any problem  TOBACCO ABUSE Discussed cessation with him today.  Not clear to me that he is interested in stopping.  He may be interested in cutting down and we talked about setting a goal to do this  You need to work hard on decreasing your cigarettes.  Try to set a goal of decreasing to 3/4 packs daily by your next visit  Baltazar Apo, MD, PhD 08/25/2018, 10:10 AM [facility] [phone] or if no answer [phone]

## 2018-08-25 NOTE — Assessment & Plan Note (Signed)
Please stop Symbicort and Spiriva for now. We will do a trial of Trelegy 1 inhalation once daily.  Rinse and gargle after use this medication.  Please call our office to let us know if you benefit from this medicine.  If so then we will order it through your pharmacy Keep albuterol (Ventolin) available to use 2 puffs up to every 4 hours if needed for shortness of breath, chest tightness, wheezing.  Flu shot and pneumonia shot are up-to-dateit. Follow with Dr Lamonte Sakai in 6 months or sooner if you have any problem

## 2018-08-25 NOTE — Addendum Note (Signed)
Addended by: Desmond Dike C on: 08/25/2018 10:21 AM   Modules accepted: Orders

## 2018-08-25 NOTE — Assessment & Plan Note (Signed)
Discussed cessation with him today.  Not clear to me that he is interested in stopping.  He may be interested in cutting down and we talked about setting a goal to do this  You need to work hard on decreasing your cigarettes.  Try to set a goal of decreasing to 3/4 packs daily by your next visit

## 2018-08-25 NOTE — Patient Instructions (Addendum)
Please stop Symbicort and Spiriva for now. We will do a trial of Trelegy 1 inhalation once daily.  Rinse and gargle after use this medication.  Please call our office to let us know if you benefit from this medicine.  If so then we will order it through your pharmacy Keep albuterol (Ventolin) available to use 2 puffs up to every 4 hours if needed for shortness of breath, chest tightness, wheezing.  Flu shot and pneumonia shot are up-to-date You need to work hard on decreasing your cigarettes.  Try to set a goal of decreasing to 3/4 packs daily by next visit. Follow with Dr Lamonte Sakai in 6 months or sooner if you have any problems

## 2018-08-26 ENCOUNTER — Ambulatory Visit: Payer: Medicare HMO | Admitting: Cardiovascular Disease

## 2018-08-26 ENCOUNTER — Encounter: Payer: Self-pay | Admitting: Cardiovascular Disease

## 2018-08-26 VITALS — BP 128/70 | HR 73 | Ht 68.0 in | Wt 129.8 lb

## 2018-08-26 DIAGNOSIS — E785 Hyperlipidemia, unspecified: Secondary | ICD-10-CM

## 2018-08-26 DIAGNOSIS — I6523 Occlusion and stenosis of bilateral carotid arteries: Secondary | ICD-10-CM | POA: Diagnosis not present

## 2018-08-26 DIAGNOSIS — I2510 Atherosclerotic heart disease of native coronary artery without angina pectoris: Secondary | ICD-10-CM

## 2018-08-26 NOTE — Patient Instructions (Signed)

## 2018-09-06 ENCOUNTER — Ambulatory Visit: Payer: Medicare HMO | Admitting: Family Medicine

## 2018-09-06 ENCOUNTER — Encounter: Payer: Self-pay | Admitting: Family Medicine

## 2018-09-06 ENCOUNTER — Ambulatory Visit (INDEPENDENT_AMBULATORY_CARE_PROVIDER_SITE_OTHER): Payer: Medicare HMO | Admitting: Family Medicine

## 2018-09-06 VITALS — BP 126/70 | HR 92 | Temp 97.8°F | Resp 15 | Ht 68.0 in | Wt 130.1 lb

## 2018-09-06 DIAGNOSIS — R21 Rash and other nonspecific skin eruption: Secondary | ICD-10-CM

## 2018-09-06 MED ORDER — PREDNISONE 10 MG (48) PO TBPK: ORAL_TABLET | ORAL | 0 refills | Status: DC

## 2018-09-06 MED ORDER — HYDROXYZINE HCL 10 MG PO TABS: 10.0000 mg | ORAL_TABLET | Freq: Three times a day (TID) | ORAL | 0 refills | Status: DC | PRN

## 2018-09-06 NOTE — Progress Notes (Signed)
Patient ID: Zachary Chambers, male    DOB: November 22, 1950, 68 y.o.   MRN: [id number]  PCP: [name], MD  Chief Complaint  Patient presents with  . Rash    Onset a few days ago. Rash is itchy and is on lower legs, arms, feet    Subjective:   Zachary Chambers is a 68 y.o. male, presents to clinic with CC of rash Rash  This is a new problem. The current episode started in the past 7 days. The problem has been gradually worsening since onset. The rash is diffuse. The rash is characterized by blistering, itchiness, bruising, burning and redness. He was exposed to a new medication. Pertinent negatives include no anorexia, congestion, cough, diarrhea, facial edema, fatigue, fever, joint pain, nail changes, rhinorrhea or sore throat.    Started trelegy inhaler a few weeks ago, no other med changes. Denies any changes or new soaps, detergents No new foods He has been outside but doesn't remember getting bit by anything, doesn't recall any exposure to plants  Only other thing he recalls different with get "Lard from pigs and pork" -  Says some steroids in the past made him itch, but he's never had rash Trelegy worked really well all day but not in the evening. He has switched back to spiriva and symbicort  Patient Active Problem List   Diagnosis Date Noted  . Heavy alcohol use 03/04/2018  . Memory changes 03/04/2018  . Leukopenia 05/04/2017  . Bruit 01/22/2014  . Muscle spasm of back 09/20/2013  . COPD (chronic obstructive pulmonary disease) ([place]) 01/14/2011  . CAD (coronary artery disease) 01/14/2011  . GERD (gastroesophageal reflux disease) 01/14/2011  . TOBACCO ABUSE 07/25/2009  . Osteoarthritis 07/25/2009  . ROTATOR CUFF SYNDROME, RIGHT 07/25/2009     Prior to Admission medications   Medication Sig Start Date End Date Taking? Authorizing Provider  albuterol (VENTOLIN HFA) 108 (90 Base) MCG/ACT inhaler INHALE 2 PUFFS into THE lungs EVERY 6 HOURS AS NEEDED FOR WHEEZING OR  SHORTNESS OF BREATH 08/05/18  Yes Delsa Grana, PA-C  aspirin 81 MG tablet Take 81 mg by mouth daily.     Yes [provider]  budesonide-formoterol (SYMBICORT) 160-4.5 MCG/ACT inhaler inhale 2 PUFFS into THE lungs 2 TIMES DAILY 06/06/18  Yes [place], Modena Nunnery, MD  celecoxib (CELEBREX) 100 MG capsule Take 1 capsule (100 mg total) by mouth 2 (two) times daily. 06/06/18  Yes [place], Modena Nunnery, MD  fluticasone [facility]) 50 MCG/ACT nasal spray Place 2 sprays into both nostrils daily. 06/06/18  Yes [place], Modena Nunnery, MD  Glucosamine HCl (GLUCOSAMINE PO) Take 1,500 mg by mouth daily.   Yes [provider]  omeprazole (PRILOSEC) 40 MG capsule TAKE 1 CAPSULE BY MOUTH EVERY DAY 06/06/18  Yes [place], Modena Nunnery, MD  simvastatin (ZOCOR) 40 MG tablet Take 1 tablet (40 mg total) by mouth at bedtime. 06/06/18  Yes [place], Modena Nunnery, MD  tiotropium (SPIRIVA) 18 MCG inhalation capsule Place 1 capsule (18 mcg total) into inhaler and inhale daily. 06/06/18  Yes [place], Modena Nunnery, MD  tiZANidine (ZANAFLEX) 4 MG tablet TAKE 1 TABLET EVERY 8 HOURS AS NEEDED FOR MUSCLE SPASMS 07/07/18  Yes [place], Modena Nunnery, MD  traMADol (ULTRAM) 50 MG tablet Take 1 tablet (50 mg total) by mouth every 6 (six) hours as needed for moderate pain. 09/03/17  Yes [place], Modena Nunnery, MD  Fluticasone-Umeclidin-Vilant (TRELEGY ELLIPTA) 100-62.5-25 MCG/INH AEPB Inhale 1 puff into the lungs daily. Patient not taking:  Reported on 09/06/2018 08/25/18   Collene Gobble, MD  NITROSTAT 0.4 MG SL tablet dissolve 1 TABLET UNDER THE TONGUE every 5 minutes AS NEEDED CHEST PAIN Patient not taking: Reported on 09/06/2018 04/23/16   [name], MD     No Known Allergies   Family History  Problem Relation Age of Onset  . Ovarian cancer Mother   . Diabetes Maternal Grandfather   . COPD Father   . Diabetes Paternal Grandfather   . Colon cancer Paternal Grandfather   . Esophageal cancer Neg Hx   . Rectal cancer Neg Hx   . Stomach  cancer Neg Hx      Social History   Socioeconomic History  . Marital status: Single    Spouse name: Not on file  . Number of children: Not on file  . Years of education: Not on file  . Highest education level: Not on file  Occupational History  . Not on file  Social Needs  . Financial resource strain: Not on file  . Food insecurity:    Worry: Not on file    Inability: Not on file  . Transportation needs:    Medical: Not on file    Non-medical: Not on file  Tobacco Use  . Smoking status: Current Every Day Smoker    Packs/day: 1.00    Years: 40.00    Pack years: 40.00    Types: Cigarettes  . Smokeless tobacco: Never Used  Substance and Sexual Activity  . Alcohol use: Yes    Alcohol/week: 30.0 standard drinks    Types: 30 Cans of beer per week    Comment: carton of beer weekly  . Drug use: No  . Sexual activity: Yes  Lifestyle  . Physical activity:    Days per week: Not on file    Minutes per session: Not on file  . Stress: Not on file  Relationships  . Social connections:    Talks on phone: Not on file    Gets together: Not on file    Attends religious service: Not on file    Active member of club or organization: Not on file    Attends meetings of clubs or organizations: Not on file    Relationship status: Not on file  . Intimate partner violence:    Fear of current or ex partner: Not on file    Emotionally abused: Not on file    Physically abused: Not on file    Forced sexual activity: Not on file  Other Topics Concern  . Not on file  Social History Narrative  . Not on file     Review of Systems  Constitutional: Negative.  Negative for fatigue and fever.  HENT: Negative.  Negative for congestion, rhinorrhea and sore throat.   Eyes: Negative.   Respiratory: Negative.  Negative for cough.   Cardiovascular: Negative.   Gastrointestinal: Negative.  Negative for anorexia and diarrhea.  Endocrine: Negative.   Genitourinary: Negative.   Musculoskeletal:  Negative.  Negative for joint pain.  Skin: Positive for rash. Negative for nail changes.  Allergic/Immunologic: Negative.   Neurological: Negative.   Hematological: Negative.   Psychiatric/Behavioral: Negative.   All other systems reviewed and are negative.      Objective:    Vitals:   09/06/18 1513  BP: 126/70  Pulse: 92  Resp: 15  Temp: 97.8 F (36.6 C)  TempSrc: Oral  SpO2: 92%  Weight: 130 lb 2 oz (59 kg)  Height: 5\' 8"  (  1.727 m)      Physical Exam Vitals signs and nursing note reviewed.  Constitutional:      Appearance: He is well-developed.  HENT:     Head: Normocephalic and atraumatic.     Nose: Nose normal.  Eyes:     General:        Right eye: No discharge.        Left eye: No discharge.     Conjunctiva/sclera: Conjunctivae normal.  Neck:     Trachea: No tracheal deviation.  Cardiovascular:     Rate and Rhythm: Normal rate and regular rhythm.  Pulmonary:     Effort: Pulmonary effort is normal. No respiratory distress.     Breath sounds: No stridor.  Musculoskeletal: Normal range of motion.  Skin:    General: Skin is warm and dry.     Findings: No rash.     Comments: Diffuse severe maculopapular rash to b/l distal arms and legs (to elbows and knees) with patchy dry appearing skin, innumerable excoriations and scabbing, petechiae and bruising in several places, also a lot of erythema and mild edema to b/l LE, non-tender, no warmth, no drainage Few scattered erythematous papules and vesicles to dorsum of hands and between fingers  Neurological:     Mental Status: He is alert.     Motor: No abnormal muscle tone.     Coordination: Coordination normal.  Psychiatric:        Behavior: Behavior normal.           Assessment & Plan:      ICD-10-CM   1. Rash and nonspecific skin eruption R21     Most consistent with contact dermatitis vs atopic dermatitis - no signs of current secondary bacterial infection, but I feel he's at risk - will tx rash with  long steroid taper, hydrate skin with vaseline and/or cerave cream, low dose hydroxyzine for severe itch PRN, follow up if not improving.  Follow up if any areas become more red, swollen or painful.   Delsa Grana, PA-C 09/06/18 3:23 PM

## 2018-09-06 NOTE — Patient Instructions (Addendum)
Vaseline!!!    Call me if the steroids make it worse.  Follow up if not improving  Atopic Dermatitis Atopic dermatitis is a skin disorder that causes inflammation of the skin. This is the most common type of eczema. Eczema is a group of skin conditions that cause the skin to be itchy, red, and swollen. This condition is generally worse during the cooler winter months and often improves during the warm summer months. Symptoms can vary from person to person. Atopic dermatitis usually starts showing signs in infancy and can last through adulthood. This condition cannot be passed from one person to another (non-contagious), but it is more common in families. Atopic dermatitis may not always be present. When it is present, it is called a flare-up. What are the causes? The exact cause of this condition is not known. Flare-ups of the condition may be triggered by:  Contact with something that you are sensitive or allergic to.  Stress.  Certain foods.  Extremely hot or cold weather.  Harsh chemicals and soaps.  Dry air.  Chlorine. What increases the risk? This condition is more likely to develop in people who have a personal history or family history of eczema, allergies, asthma, or hay fever. What are the signs or symptoms? Symptoms of this condition include:  Dry, scaly skin.  Red, itchy rash.  Itchiness, which can be severe. This may occur before the skin rash. This can make sleeping difficult.  Skin thickening and cracking that can occur over time. How is this diagnosed? This condition is diagnosed based on your symptoms, a medical history, and a physical exam. How is this treated? There is no cure for this condition, but symptoms can usually be controlled. Treatment focuses on:  Controlling the itchiness and scratching. You may be given medicines, such as antihistamines or steroid creams.  Limiting exposure to things that you are sensitive or allergic to  (allergens).  Recognizing situations that cause stress and developing a plan to manage stress. If your atopic dermatitis does not get better with medicines, or if it is all over your body (widespread), a treatment using a specific type of light (phototherapy) may be used. Follow these instructions at home: Skin care   Keep your skin well-moisturized. Doing this seals in moisture and helps to prevent dryness. ? Use unscented lotions that have petroleum in them. ? Avoid lotions that contain alcohol or water. They can dry the skin.  Keep baths or showers short (less than 5 minutes) in warm water. Do not use hot water. ? Use mild, unscented cleansers for bathing. Avoid soap and bubble bath. ? Apply a moisturizer to your skin right after a bath or shower.  Do not apply anything to your skin without checking with your health care provider. General instructions  Dress in clothes made of cotton or cotton blends. Dress lightly because heat increases itchiness.  When washing your clothes, rinse your clothes twice so all of the soap is removed.  Avoid any triggers that can cause a flare-up.  Try to manage your stress.  Keep your fingernails cut short.  Avoid scratching. Scratching makes the rash and itchiness worse. It may also result in a skin infection (impetigo) due to a break in the skin caused by scratching.  Take or apply over-the-counter and prescription medicines only as told by your health care provider.  Keep all follow-up visits as told by your health care provider. This is important.  Do not be around people who have cold sores  or fever blisters. If you get the infection, it may cause your atopic dermatitis to worsen. Contact a health care provider if:  Your itchiness interferes with sleep.  Your rash gets worse or it is not better within one week of starting treatment.  You have a fever.  You have a rash flare-up after having contact with someone who has cold sores or  fever blisters. Get help right away if:  You develop pus or soft yellow scabs in the rash area. Summary  This condition causes a red rash and itchy, dry, scaly skin.  Treatment focuses on controlling the itchiness and scratching, limiting exposure to things that you are sensitive or allergic to (allergens), recognizing situations that cause stress, and developing a plan to manage stress.  Keep your skin well-moisturized.  Keep baths or showers shorter than 5 minutes and use warm water. Do not use hot water. This information is not intended to replace advice given to you by your health care provider. Make sure you discuss any questions you have with your health care provider. Document Released: 07/31/2000 Document Revised: 09/04/2016 Document Reviewed: 09/04/2016 Elsevier Interactive Patient Education  2019 [employer].

## 2018-09-08 ENCOUNTER — Telehealth: Payer: Self-pay | Admitting: Family Medicine

## 2018-09-08 NOTE — Telephone Encounter (Signed)
Patient called stating that he accidentally took 6 hydroxyzine tablets that was prescribed to him 09/06/2018 thinking that they were his prednisone. I advised patient that medication may cause excessive drowsiness and dry feeling. Advised patient to call poison control as they will be able to give him expert advice quickly on what to do and watch out for. Poison Control number was given to patient and also informed him that he develops any concerning symptoms to call 911. Patient verbalized understanding.

## 2018-10-03 ENCOUNTER — Other Ambulatory Visit: Payer: Self-pay | Admitting: Family Medicine

## 2018-10-07 ENCOUNTER — Telehealth: Payer: Self-pay | Admitting: Emergency Medicine

## 2018-10-07 MED ORDER — FLUTICASONE-UMECLIDIN-VILANT 100-62.5-25 MCG/INH IN AEPB: 1.0000 | INHALATION_SPRAY | Freq: Every day | RESPIRATORY_TRACT | 5 refills | Status: DC

## 2018-10-07 NOTE — Telephone Encounter (Signed)
Spoke with pt and advised rx for Trelegy was sent to pharmacy. Nothing further is needed.     Assessment & Plan Note by Collene Gobble, MD at 08/25/2018 10:09 AM  Author: Collene Gobble, MD Author Type: Physician Filed: 08/25/2018 10:09 AM  Note Status: Written Cosign: Cosign Not Required Encounter Date: 08/25/2018  Problem: COPD (chronic obstructive pulmonary disease)  Editor: Collene Gobble, MD (Physician)    Please stop Symbicort and Spiriva for now. We will do a trial of Trelegy 1 inhalation once daily.  Rinse and gargle after use this medication.  Please call our office to let us know if you benefit from this medicine.  If so then we will order it through your pharmacy Keep albuterol (Ventolin) available to use 2 puffs up to every 4 hours if needed for shortness of breath, chest tightness, wheezing.  Flu shot and pneumonia shot are up-to-dateit. Follow with Dr Lamonte Sakai in 6 months or sooner if you have any problem

## 2018-10-25 ENCOUNTER — Other Ambulatory Visit (INDEPENDENT_AMBULATORY_CARE_PROVIDER_SITE_OTHER): Payer: Self-pay | Admitting: Specialist

## 2018-11-02 ENCOUNTER — Telehealth: Payer: Self-pay

## 2018-11-02 ENCOUNTER — Other Ambulatory Visit: Payer: Self-pay | Admitting: Family Medicine

## 2018-11-02 ENCOUNTER — Telehealth: Payer: Self-pay | Admitting: Emergency Medicine

## 2018-11-02 NOTE — Telephone Encounter (Signed)
-----   Message from Delsa Grana, [place] sent at 11/02/2018  1:40 PM EDT ----- Can you generate encounter in pt's chart with this info. ----- Message ----- From: Vanice [name], CMA Sent: 11/02/2018  12:48 PM EDT To: Delsa Grana, PA-C  Pt asked for a refill for Tramadol and Symbicort. I declined the tramadol due to it being over a year since prescribed. I sent in the Symbicort for 3 months supply. Let me know if you have any questions.

## 2018-11-02 NOTE — Telephone Encounter (Signed)
Left message for patient. Need to see why is he getting a 14 day supply when in his chart, it shows the RX was written for a 30 day supply with 5 refills.

## 2018-11-02 NOTE — Telephone Encounter (Signed)
I agree with the tramadol - he may be able to get more, but first he just needs to be seen, or I can call and talk to him - to satisfy controlled pain medication laws/regulations.  Regarding the symbicort.  How did you look at that med hx to determine the refill?  If I click Chart icon or word top right - to review - the most recent encounters are from pulmonology and multiple calls about refills.  First - I would defer to pulmonology - tell pt to f/up with them for all refills because they are the managing specialist for this Dx and all pertinent meds.  Second - if you weren't sure - read back through the most recent notes and see the changes.  Without much digging I saw tthat January he saw Dr. Lamonte Sakai, a follow up telephone call in Feb - Dr. Lamonte Sakai outlined his change in management - switched pt OFF symbicort and spiriva and started trelegy with instructions.  Today the pt called both pulmonology and called [place] to request refills of inhalers.    We need to call the pharmacy and cancel the symbicort.  And tell the pt what pulmonology's current management plan it.  If things aren't working for him, he needs to call them to discuss and get their instructions for management.    Third - while we are having you orient, or at least initially, What we'll do is have you forward the med refill requests to me with your plan (thoughts, questions?), and I can review, and that way we can check each other while we get to know each other.

## 2018-11-03 ENCOUNTER — Other Ambulatory Visit: Payer: Self-pay | Admitting: Family Medicine

## 2018-11-03 NOTE — Telephone Encounter (Signed)
Last filled: 09/06/2018 Last office visit: 09/06/2018

## 2018-11-22 ENCOUNTER — Other Ambulatory Visit: Payer: Self-pay | Admitting: Family Medicine

## 2018-11-24 ENCOUNTER — Other Ambulatory Visit (HOSPITAL_COMMUNITY): Payer: Self-pay | Admitting: *Deleted

## 2018-11-24 DIAGNOSIS — D72819 Decreased white blood cell count, unspecified: Secondary | ICD-10-CM

## 2018-11-28 ENCOUNTER — Other Ambulatory Visit (HOSPITAL_COMMUNITY): Payer: Medicare HMO

## 2018-12-01 ENCOUNTER — Other Ambulatory Visit: Payer: Self-pay | Admitting: Emergency Medicine

## 2018-12-02 ENCOUNTER — Telehealth: Payer: Self-pay | Admitting: Emergency Medicine

## 2018-12-02 NOTE — Telephone Encounter (Signed)
Called and spoke with patient about taking both inhalers at the same time. I let him know just wait 2-5 minutes between the two different medications. He verbalized understanding and is going to pick them up from pharmacy now   Nothing further needed.

## 2018-12-05 ENCOUNTER — Ambulatory Visit: Payer: Medicare HMO | Admitting: Family Medicine

## 2018-12-05 ENCOUNTER — Other Ambulatory Visit: Payer: Self-pay

## 2018-12-05 ENCOUNTER — Encounter: Payer: Self-pay | Admitting: Family Medicine

## 2018-12-05 ENCOUNTER — Ambulatory Visit (INDEPENDENT_AMBULATORY_CARE_PROVIDER_SITE_OTHER): Payer: Medicare HMO | Admitting: Family Medicine

## 2018-12-05 DIAGNOSIS — F1090 Alcohol use, unspecified, uncomplicated: Secondary | ICD-10-CM

## 2018-12-05 DIAGNOSIS — R21 Rash and other nonspecific skin eruption: Secondary | ICD-10-CM | POA: Diagnosis not present

## 2018-12-05 DIAGNOSIS — J449 Chronic obstructive pulmonary disease, unspecified: Secondary | ICD-10-CM

## 2018-12-05 DIAGNOSIS — M150 Primary generalized (osteo)arthritis: Secondary | ICD-10-CM | POA: Diagnosis not present

## 2018-12-05 DIAGNOSIS — Z789 Other specified health status: Secondary | ICD-10-CM | POA: Diagnosis not present

## 2018-12-05 DIAGNOSIS — M159 Polyosteoarthritis, unspecified: Secondary | ICD-10-CM

## 2018-12-05 DIAGNOSIS — I25110 Atherosclerotic heart disease of native coronary artery with unstable angina pectoris: Secondary | ICD-10-CM | POA: Diagnosis not present

## 2018-12-05 DIAGNOSIS — M8949 Other hypertrophic osteoarthropathy, multiple sites: Secondary | ICD-10-CM

## 2018-12-05 MED ORDER — TIZANIDINE HCL 4 MG PO TABS: ORAL_TABLET | ORAL | 0 refills | Status: DC

## 2018-12-05 MED ORDER — HYDROXYZINE HCL 10 MG PO TABS: 10.0000 mg | ORAL_TABLET | Freq: Three times a day (TID) | ORAL | 1 refills | Status: DC | PRN

## 2018-12-05 MED ORDER — CLOTRIMAZOLE-BETAMETHASONE 1-0.05 % EX CREA: 1.0000 "application " | TOPICAL_CREAM | Freq: Two times a day (BID) | CUTANEOUS | 1 refills | Status: DC

## 2018-12-05 MED ORDER — NITROGLYCERIN 0.4 MG SL SUBL: SUBLINGUAL_TABLET | SUBLINGUAL | 0 refills | Status: DC

## 2018-12-05 NOTE — Progress Notes (Signed)
Virtual Visit via Telephone Note  I connected with Zachary Chambers on 12/05/18 at 2:21pm by telephone and verified that I am speaking with the correct person using two identifiers.   Pt location: at home  Physician location: at home, Vic Blackbird MD  On Call- physician and patient     I discussed the limitations, risks, security and privacy concerns of performing an evaluation and management service by telephone and the availability of in person appointments. I also discussed with the patient that there may be a patient responsible charge related to this service. The patient expressed understanding and agreed to proceed.   History of Present Illness: Feeling well,, medications and reviewed Leukopenia- has f/u hematology this fall, due for labs, states he asked them to push back due to virus, he did have labs in December  COPD- managed by pulmonary, he uses Trelegy daily, has albuterol and uses symbicort on occasion as a rescue which has been okay'ed by his pulmonlogist   OA- still using celebrex, which helps, occ use of ultram and flexeril  ETOH use- states he has cut back, reitered his cardiac meds and ETOH effect   No chest pain, no SOB  Seen for dermatitis on skin in Jan, states it never completely went away was still dry looking, now back with itching and redness on his forearm and feet, no blisters    Observations/Objective: Unable to visualize   Assessment and Plan: Rash- recurrent dermatitis but also has dry skin - will try lotrisone cream , if this does not improve would recommend biopsy due to recurrent rash in same spot   COPD- reiterated use of inhalers, no changes, avoid tobacco   CAD- no CP, does not have home BP readings, no changes to meds, NTG refilled  OA- continue celebrex, ultram   Discussed cutting back on ETOH use   Leukopenia- will f/u in office in June will get labs then if hematology has not done yet   Follow Up Instructions:    I discussed the  assessment and treatment plan with the patient. The patient was provided an opportunity to ask questions and all were answered. The patient agreed with the plan and demonstrated an understanding of the instructions.   The patient was advised to call back or seek an in-person evaluation if the symptoms worsen or if the condition fails to improve as anticipated.  I provided  13 minutes of non-face-to-face time during this encounter. End time 2:34  Vic Blackbird, MD

## 2018-12-22 ENCOUNTER — Other Ambulatory Visit: Payer: Self-pay | Admitting: Family Medicine

## 2018-12-22 ENCOUNTER — Other Ambulatory Visit: Payer: Self-pay | Admitting: *Deleted

## 2018-12-22 MED ORDER — CELECOXIB 100 MG PO CAPS: 100.0000 mg | ORAL_CAPSULE | Freq: Two times a day (BID) | ORAL | 3 refills | Status: DC

## 2018-12-27 ENCOUNTER — Other Ambulatory Visit (HOSPITAL_COMMUNITY): Payer: Self-pay | Admitting: Cardiovascular Disease

## 2018-12-27 DIAGNOSIS — I6523 Occlusion and stenosis of bilateral carotid arteries: Secondary | ICD-10-CM

## 2019-01-13 ENCOUNTER — Telehealth: Payer: Self-pay | Admitting: Emergency Medicine

## 2019-01-13 NOTE — Telephone Encounter (Signed)
Left message for patient to call back  

## 2019-01-16 NOTE — Telephone Encounter (Signed)
Called and spoke with patient regarding not feeling well in last week Pt reports SOB, wheezing with some allergies Scheduled televisit with TP on 01/17/2019 at 300pm Pt verbalized understanding Nothing further needed

## 2019-01-17 ENCOUNTER — Other Ambulatory Visit: Payer: Self-pay

## 2019-01-17 ENCOUNTER — Ambulatory Visit (INDEPENDENT_AMBULATORY_CARE_PROVIDER_SITE_OTHER): Payer: Medicare HMO | Admitting: Adult Health

## 2019-01-17 ENCOUNTER — Encounter: Payer: Self-pay | Admitting: Adult Health

## 2019-01-17 DIAGNOSIS — J449 Chronic obstructive pulmonary disease, unspecified: Secondary | ICD-10-CM | POA: Diagnosis not present

## 2019-01-17 DIAGNOSIS — Z720 Tobacco use: Secondary | ICD-10-CM | POA: Diagnosis not present

## 2019-01-17 MED ORDER — ALBUTEROL SULFATE (2.5 MG/3ML) 0.083% IN NEBU: 2.5000 mg | INHALATION_SOLUTION | RESPIRATORY_TRACT | 4 refills | Status: DC | PRN

## 2019-01-17 NOTE — Progress Notes (Signed)
Virtual Visit via Telephone Note  I connected with Zachary Chambers on 01/17/19 at  2:00 PM EDT by telephone and verified that I am speaking with the correct person using two identifiers.  Location: Patient: Home  Provider: Office    I discussed the limitations, risks, security and privacy concerns of performing an evaluation and management service by telephone and the availability of in person appointments. I also discussed with the patient that there may be a patient responsible charge related to this service. The patient expressed understanding and agreed to proceed.   History of Present Illness: Today tele-visit is a 61-month follow-up of COPD  68 year old male active smoker followed for COPD  He says overall breathing has been doing okay.  Did have episode last week where he had increased shortness of breath and some intermittent wheezing.  He used his significant others albuterol nebulizer treatment and symptoms resolved.  Patient says he is back to baseline.  He denies any increased cough shortness of breath or wheezing.  He does get winded with heavy activity.  Patient does continue to smoke.  We discussed smoking cessation.  Patient says he does not interested in quitting smoking. Last visit patient was changed from Symbicort and Spiriva to Trelegy.  He says this is working well.  Observations/Objective: PFT December 2014 FEV1 44%, ratio 47, FVC 70%, no significant bronchodilator response, DLCO 47%  Assessment and Plan: Severe COPD recent mild flare 1 week ago resolved with use of albuterol nebulizer times once.  Patient will continue on triple therapy with Trelegy inhaler.  Activity as tolerated.  Smoking cessation encouraged Discussed low-dose CT screening last was done in 2018 with benign appearance recommendations for follow-up CT in 12 months.  Tobacco abuse-  Plan  Patient Instructions  Continue on TRELEGY , rinse after use.  Work on stopping smoking .  Refer for LDCT chest  scan .  May use Albuterol inhaler or Neb every 4-6 hr as needed for wheezing .  Order for [employer] /Albuterol to home care company .  Follow up with Dr. Lamonte Sakai  In 6 months and  As needed          Follow Up Instructions: Follow-up with Dr. Lamonte Sakai in 6 months and as needed   I discussed the assessment and treatment plan with the patient. The patient was provided an opportunity to ask questions and all were answered. The patient agreed with the plan and demonstrated an understanding of the instructions.   The patient was advised to call back or seek an in-person evaluation if the symptoms worsen or if the condition fails to improve as anticipated.  I provided 22  minutes of non-face-to-face time during this encounter.   Zachary Edison, NP

## 2019-01-17 NOTE — Patient Instructions (Addendum)
Continue on TRELEGY , rinse after use.  Work on stopping smoking .  Refer for LDCT chest scan .  May use Albuterol inhaler or Neb every 4-6 hr as needed for wheezing .  Order for [employer] /Albuterol to home care company .  Follow up with Dr. Lamonte Sakai  In 6 months and  As needed

## 2019-01-19 DIAGNOSIS — J449 Chronic obstructive pulmonary disease, unspecified: Secondary | ICD-10-CM | POA: Diagnosis not present

## 2019-01-20 ENCOUNTER — Telehealth: Payer: Self-pay | Admitting: Adult Health

## 2019-01-20 NOTE — Telephone Encounter (Signed)
Returned call to patient and made aware rx for albuterol was being sent DME. Per [place] rx has not been signed by TP. Advised he will received medication thru the mail. Asked patient if he needed a one-time rx sent to local pharm. Pt states he does not and will wait on mail order. Pt did receive nebulizer on yesterday from [place]. Pending Albuterol rx being signed and faxed to DME.

## 2019-01-20 NOTE — Telephone Encounter (Signed)
RX has been signed and faxed to [place].   ATC patient, someone answered but did not say anything. '  Will need to call back Monday morning.

## 2019-01-23 NOTE — Telephone Encounter (Signed)
Pt returned call. Stated to pt that the nebulized medication was sent to [facility] for him 6/5 and that hopefully they should be able to get med to him soon. Pt verbalized understanding. Nothing further needed.

## 2019-01-23 NOTE — Telephone Encounter (Signed)
Attempted to call pt but unable to reach. Left message for pt to return call. 

## 2019-01-27 ENCOUNTER — Other Ambulatory Visit: Payer: Self-pay | Admitting: Family Medicine

## 2019-02-01 ENCOUNTER — Other Ambulatory Visit: Payer: Self-pay

## 2019-02-01 ENCOUNTER — Encounter: Payer: Self-pay | Admitting: Family Medicine

## 2019-02-01 ENCOUNTER — Ambulatory Visit (INDEPENDENT_AMBULATORY_CARE_PROVIDER_SITE_OTHER): Payer: Medicare HMO | Admitting: Family Medicine

## 2019-02-01 VITALS — BP 132/64 | HR 68 | Temp 98.1°F | Resp 14 | Ht 68.0 in | Wt 128.0 lb

## 2019-02-01 DIAGNOSIS — I25110 Atherosclerotic heart disease of native coronary artery with unstable angina pectoris: Secondary | ICD-10-CM | POA: Diagnosis not present

## 2019-02-01 DIAGNOSIS — D708 Other neutropenia: Secondary | ICD-10-CM | POA: Diagnosis not present

## 2019-02-01 DIAGNOSIS — K219 Gastro-esophageal reflux disease without esophagitis: Secondary | ICD-10-CM | POA: Diagnosis not present

## 2019-02-01 DIAGNOSIS — M150 Primary generalized (osteo)arthritis: Secondary | ICD-10-CM | POA: Diagnosis not present

## 2019-02-01 DIAGNOSIS — J449 Chronic obstructive pulmonary disease, unspecified: Secondary | ICD-10-CM | POA: Diagnosis not present

## 2019-02-01 DIAGNOSIS — M159 Polyosteoarthritis, unspecified: Secondary | ICD-10-CM

## 2019-02-01 DIAGNOSIS — M8949 Other hypertrophic osteoarthropathy, multiple sites: Secondary | ICD-10-CM

## 2019-02-01 MED ORDER — SIMVASTATIN 20 MG PO TABS: 20.0000 mg | ORAL_TABLET | Freq: Every day | ORAL | 2 refills | Status: DC

## 2019-02-01 MED ORDER — TRAMADOL HCL 50 MG PO TABS: ORAL_TABLET | ORAL | 0 refills | Status: DC

## 2019-02-01 NOTE — Progress Notes (Signed)
   Subjective:    Patient ID: Zachary Chambers, male    DOB: [birth year], 68 y.o.   MRN: [id number]  Patient presents for Medication Review (is fasting)  Pt here to f/u chronic medical problems   Using Trelegy once a day, and symbicort twice a day - pulmonary is aware. Has not needed albuterol   CT chest has been ordered by pulmonary  continues to smoke   OA- using ultram , needs refill and celebrex   CAD- has not needed NTG, taking zocor         Review Of Systems:  GEN- denies fatigue, fever, weight loss,weakness, recent illness HEENT- denies eye drainage, change in vision, nasal discharge, CVS- denies chest pain, palpitations RESP- denies SOB, cough, wheeze ABD- denies N/V, change in stools, abd pain GU- denies dysuria, hematuria, dribbling, incontinence MSK- denies joint pain, muscle aches, injury Neuro- denies headache, dizziness, syncope, seizure activity       Objective:    BP 132/64   Pulse 68   Temp 98.1 F (36.7 C) (Oral)   Resp 14   Ht 5\' 8"  (1.727 m)   Wt 128 lb (58.1 kg)   SpO2 97%   BMI 19.46 kg/m  GEN- NAD, alert and oriented x3 HEENT- PERRL, EOMI, non injected sclera, pink conjunctiva, MMM, oropharynx clear Neck- Supple, no thyromegaly CVS- RRR, no murmur - distant HS RESP-CTAB - no wheeze ABD-NABS,soft,NT,ND EXT- No edema Pulses- Radial 2+        Assessment & Plan:      Problem List Items Addressed This Visit      Unprioritized   CAD (coronary artery disease)    Followed by cardiology, no current symptoms Check fasting labs On statin drug       Relevant Medications   simvastatin (ZOCOR) 20 MG tablet   Other Relevant Orders   CBC with Differential/Platelet   Comprehensive metabolic panel   Lipid panel   COPD (chronic obstructive pulmonary disease) ([place]) - Primary    I reviewed pulmonary note, continued on Trelegy  will send message to pulmonary about symbicort use       Relevant Medications   budesonide-formoterol (SYMBICORT)  160-4.5 MCG/ACT inhaler   GERD (gastroesophageal reflux disease)    Continues to benefit from PPI      Leukopenia    Recheck labs and LDH will forward to hematology      Relevant Orders   Lactate dehydrogenase   Osteoarthritis    Ultram refilled      Relevant Medications   traMADol (ULTRAM) 50 MG tablet      Note: This dictation was prepared with Dragon dictation along with smaller phrase technology. Any transcriptional errors that result from this process are unintentional.

## 2019-02-01 NOTE — Assessment & Plan Note (Signed)
Recheck labs and LDH will forward to hematology

## 2019-02-01 NOTE — Patient Instructions (Addendum)
F/u November for Wellness Exam

## 2019-02-01 NOTE — Assessment & Plan Note (Signed)
Continues to benefit from PPI

## 2019-02-01 NOTE — Assessment & Plan Note (Signed)
I reviewed pulmonary note, continued on Trelegy  will send message to pulmonary about symbicort use

## 2019-02-01 NOTE — Assessment & Plan Note (Signed)
Ultram refilled.

## 2019-02-01 NOTE — Assessment & Plan Note (Signed)
Followed by cardiology, no current symptoms Check fasting labs On statin drug

## 2019-02-02 LAB — COMPREHENSIVE METABOLIC PANEL
AG Ratio: 1.4 (calc) (ref 1.0–2.5)
ALT: 7 U/L — ABNORMAL LOW (ref 9–46)
AST: 19 U/L (ref 10–35)
Albumin: 4.1 g/dL (ref 3.6–5.1)
Alkaline phosphatase (APISO): 93 U/L (ref 35–144)
BUN: 9 mg/dL (ref 7–25)
CO2: 28 mmol/L (ref 20–32)
Calcium: 9.6 mg/dL (ref 8.6–10.3)
Chloride: 98 mmol/L (ref 98–110)
Creat: 0.84 mg/dL (ref 0.70–1.25)
Globulin: 2.9 g/dL (calc) (ref 1.9–3.7)
Glucose, Bld: 89 mg/dL (ref 65–99)
Potassium: 4.6 mmol/L (ref 3.5–5.3)
Sodium: 135 mmol/L (ref 135–146)
Total Bilirubin: 0.4 mg/dL (ref 0.2–1.2)
Total Protein: 7 g/dL (ref 6.1–8.1)

## 2019-02-02 LAB — CBC WITH DIFFERENTIAL/PLATELET
Absolute Monocytes: 426 cells/uL (ref 200–950)
Basophils Absolute: 110 cells/uL (ref 0–200)
Basophils Relative: 2.9 %
Eosinophils Absolute: 395 cells/uL (ref 15–500)
Eosinophils Relative: 10.4 %
HCT: 40 % (ref 38.5–50.0)
Hemoglobin: 13.7 g/dL (ref 13.2–17.1)
Lymphs Abs: 775 cells/uL — ABNORMAL LOW (ref 850–3900)
MCH: 31.8 pg (ref 27.0–33.0)
MCHC: 34.3 g/dL (ref 32.0–36.0)
MCV: 92.8 fL (ref 80.0–100.0)
MPV: 9.1 fL (ref 7.5–12.5)
Monocytes Relative: 11.2 %
Neutro Abs: 2094 cells/uL (ref 1500–7800)
Neutrophils Relative %: 55.1 %
Platelets: 449 10*3/uL — ABNORMAL HIGH (ref 140–400)
RBC: 4.31 10*6/uL (ref 4.20–5.80)
RDW: 13 % (ref 11.0–15.0)
Total Lymphocyte: 20.4 %
WBC: 3.8 10*3/uL (ref 3.8–10.8)

## 2019-02-02 LAB — LIPID PANEL
Cholesterol: 137 mg/dL (ref <200)
HDL: 68 mg/dL (ref >=40)
LDL Cholesterol (Calc): 58 mg/dL (calc)
Non-HDL Cholesterol (Calc): 69 mg/dL (calc) (ref <130)
Total CHOL/HDL Ratio: 2 (calc) (ref <5.0)
Triglycerides: 39 mg/dL (ref <150)

## 2019-02-02 LAB — LACTATE DEHYDROGENASE: LDH: 217 U/L (ref 120–250)

## 2019-02-03 ENCOUNTER — Telehealth: Payer: Self-pay | Admitting: Adult Health

## 2019-02-03 NOTE — Telephone Encounter (Signed)
Called & spoke w/ Jan at [facility]. Jan states she faxed a form over to TP via [phone] on Monday 01/30/2019 regarding this pt's nebulizer solution. I checked TP's incoming mailbox and could not find this form. Jan is faxing this form over again for TP to review and sign.   I have received this fax at 4:30 PM EDT. Will place in TP's mailbox. Nothing further needed at this time.

## 2019-02-07 ENCOUNTER — Telehealth: Payer: Self-pay | Admitting: Adult Health

## 2019-02-07 NOTE — Telephone Encounter (Signed)
Message from a previous encounter from [facility] W: Note   Called & spoke w/ Jan at [facility]. Jan states she faxed a form over to TP via [phone] on Monday 01/30/2019 regarding this pt's nebulizer solution. I checked TP's incoming mailbox and could not find this form. Jan is faxing this form over again for TP to review and sign.   I have received this fax at 4:30 PM EDT. Will place in TP's mailbox. Nothing further needed at this time.      Rodena Piety, please advise if you have received a form from [place] in regards to this. Thank you!

## 2019-02-08 NOTE — Telephone Encounter (Signed)
I haven't seen a form for this patient but will check in the folder on TP's desk

## 2019-02-08 NOTE — Telephone Encounter (Signed)
Message routed to [place]. Waiting for Rodena Piety to respond.

## 2019-02-09 NOTE — Telephone Encounter (Signed)
Form has been signed and faxed to [place]

## 2019-02-09 NOTE — Telephone Encounter (Signed)
This form is in Zachary Chambers's CMN folder for her to sign

## 2019-02-18 DIAGNOSIS — J449 Chronic obstructive pulmonary disease, unspecified: Secondary | ICD-10-CM | POA: Diagnosis not present

## 2019-03-02 ENCOUNTER — Other Ambulatory Visit: Payer: Self-pay | Admitting: Family Medicine

## 2019-03-08 DIAGNOSIS — H40033 Anatomical narrow angle, bilateral: Secondary | ICD-10-CM | POA: Diagnosis not present

## 2019-03-08 DIAGNOSIS — H52223 Regular astigmatism, bilateral: Secondary | ICD-10-CM | POA: Diagnosis not present

## 2019-03-08 DIAGNOSIS — H2513 Age-related nuclear cataract, bilateral: Secondary | ICD-10-CM | POA: Diagnosis not present

## 2019-03-08 DIAGNOSIS — H524 Presbyopia: Secondary | ICD-10-CM | POA: Diagnosis not present

## 2019-03-21 DIAGNOSIS — J449 Chronic obstructive pulmonary disease, unspecified: Secondary | ICD-10-CM | POA: Diagnosis not present

## 2019-04-15 ENCOUNTER — Other Ambulatory Visit: Payer: Self-pay | Admitting: Family Medicine

## 2019-04-18 ENCOUNTER — Other Ambulatory Visit: Payer: Self-pay | Admitting: Family Medicine

## 2019-04-19 NOTE — Telephone Encounter (Signed)
Requesting refill    Tizanidine  LOV: 02/01/19  LRF:  12/05/18

## 2019-04-21 DIAGNOSIS — J449 Chronic obstructive pulmonary disease, unspecified: Secondary | ICD-10-CM | POA: Diagnosis not present

## 2019-05-21 DIAGNOSIS — J449 Chronic obstructive pulmonary disease, unspecified: Secondary | ICD-10-CM | POA: Diagnosis not present

## 2019-05-25 ENCOUNTER — Inpatient Hospital Stay (HOSPITAL_COMMUNITY): Payer: Medicare HMO | Attending: Hematology

## 2019-06-01 ENCOUNTER — Ambulatory Visit (HOSPITAL_COMMUNITY): Payer: Medicare HMO | Admitting: Hematology

## 2019-06-16 ENCOUNTER — Other Ambulatory Visit: Payer: Self-pay | Admitting: Family Medicine

## 2019-06-16 NOTE — Telephone Encounter (Signed)
Ok to refill 

## 2019-06-21 DIAGNOSIS — J449 Chronic obstructive pulmonary disease, unspecified: Secondary | ICD-10-CM | POA: Diagnosis not present

## 2019-07-06 ENCOUNTER — Other Ambulatory Visit: Payer: Self-pay

## 2019-07-07 ENCOUNTER — Ambulatory Visit (INDEPENDENT_AMBULATORY_CARE_PROVIDER_SITE_OTHER): Payer: Medicare HMO | Admitting: Family Medicine

## 2019-07-07 ENCOUNTER — Encounter: Payer: Self-pay | Admitting: Family Medicine

## 2019-07-07 VITALS — BP 124/72 | HR 76 | Temp 98.1°F | Resp 18 | Ht 68.0 in | Wt 124.0 lb

## 2019-07-07 DIAGNOSIS — J449 Chronic obstructive pulmonary disease, unspecified: Secondary | ICD-10-CM | POA: Diagnosis not present

## 2019-07-07 DIAGNOSIS — Z0001 Encounter for general adult medical examination with abnormal findings: Secondary | ICD-10-CM

## 2019-07-07 DIAGNOSIS — M8949 Other hypertrophic osteoarthropathy, multiple sites: Secondary | ICD-10-CM | POA: Diagnosis not present

## 2019-07-07 DIAGNOSIS — Z0000 Encounter for general adult medical examination without abnormal findings: Secondary | ICD-10-CM

## 2019-07-07 DIAGNOSIS — Z23 Encounter for immunization: Secondary | ICD-10-CM

## 2019-07-07 DIAGNOSIS — Z125 Encounter for screening for malignant neoplasm of prostate: Secondary | ICD-10-CM | POA: Diagnosis not present

## 2019-07-07 DIAGNOSIS — F17200 Nicotine dependence, unspecified, uncomplicated: Secondary | ICD-10-CM | POA: Diagnosis not present

## 2019-07-07 DIAGNOSIS — I25110 Atherosclerotic heart disease of native coronary artery with unstable angina pectoris: Secondary | ICD-10-CM | POA: Diagnosis not present

## 2019-07-07 DIAGNOSIS — M159 Polyosteoarthritis, unspecified: Secondary | ICD-10-CM

## 2019-07-07 MED ORDER — TIZANIDINE HCL 4 MG PO TABS: ORAL_TABLET | ORAL | 0 refills | Status: DC

## 2019-07-07 MED ORDER — TRAMADOL HCL 50 MG PO TABS: ORAL_TABLET | ORAL | 0 refills | Status: DC

## 2019-07-07 MED ORDER — ALPRAZOLAM 0.25 MG PO TABS: 0.2500 mg | ORAL_TABLET | Freq: Two times a day (BID) | ORAL | 1 refills | Status: DC | PRN

## 2019-07-07 NOTE — Progress Notes (Signed)
Subjective:   Patient presents for Medicare Annual/Subsequent preventive examination.    Pt here for wellness exam, medications reviewed  No recent illness or ER visits    He admits to some stressors dealing with his grandson. He also feels when he is rushed or gets anxious his breathing acts up and he feels like he is having a panic attack and cant get his breath. Inhalers dont work during those times.   CAD- on zocor   GERD- on prilosec   Chronic pain/OA- uses ultram as needed     Uses atarax as needed for itching still    Review Past Medical/Family/Social: Per EMR   Risk Factors  Current exercise habits: stays active Dietary issues discussed: no major concerns   Cardiac risk factors:CAD/COPD   Depression Screen  (Note: if answer to either of the following is "Yes", a more complete depression screening is indicated)  Over the past two weeks, have you felt down, depressed or hopeless? No Over the past two weeks, have you felt little interest or pleasure in doing things? No Have you lost interest or pleasure in daily life? No Do you often feel hopeless? No Do you cry easily over simple problems? No   Activities of Daily Living  In your present state of health, do you have any difficulty performing the following activities?:  Driving? No  Managing money? No  Feeding yourself? No  Getting from bed to chair? No  Climbing a flight of stairs? No  Preparing food and eating?: No  Bathing or showering? No  Getting dressed: No  Getting to the toilet? No  Using the toilet:No  Moving around from place to place: No  In the past year have you fallen or had a near fall?:No  Are you sexually active? No  Do you have more than one partner? No   Hearing Difficulties: No  Do you often ask people to speak up or repeat themselves? No  Do you experience ringing or noises in your ears? No Do you have difficulty understanding soft or whispered voices? No  Do you feel that you have a  problem with memory? No Do you often misplace items? No  Do you feel safe at home? Yes  Cognitive Testing  Alert? Yes Normal Appearance?Yes  Oriented to person? Yes Place? Yes  Time? Yes  Recall of three objects? Yes  Can perform simple calculations? Yes  Displays appropriate judgment?Yes  Can read the correct time from a watch face?Yes   List the Names of Other Physician/Practitioners you currently use:   Cardiology/pulmonary   Screening Tests / Date UTD Colonoscopy                     Pneumonia   Influenza Vaccine - due today Tetanus/tdap  GEN- denies fatigue, fever, weight loss,weakness, recent illness HEENT- denies eye drainage, change in vision, nasal discharge, CVS- denies chest pain, palpitations RESP- denies SOB, cough, wheeze ABD- denies N/V, change in stools, abd pain GU- denies dysuria, hematuria, dribbling, incontinence MSK- denies joint pain, muscle aches, injury Neuro- denies headache, dizziness, syncope, seizure activity  GEN- NAD, alert and oriented x3 HEENT- PERRL, EOMI, non injected sclera, pink conjunctiva, MMM, oropharynx clear Neck- Supple, no thryomegaly CVS- RRR, no murmur, distant sounds RESP-CTAB ABD-NABS,soft,NT,ND Psych- normal affect and mood  EXT- No edema Pulses- Radial, DP- 2+   Assessment:    Annual wellness medicare exam   Plan:    During the course of the visit the patient  was educated and counseled about appropriate screening and preventive services including:   PSA screening discussed  Flu shot given   CAD- no current symptoms, followed by cardiology    COPD- continue inhalers, discussed tobacco cessationl,   Anxiety- he describes some anxiety attachs, which makes his COPD/breathing worse in those moments, will give him low dose xanax to have on hand, see if this helps the SOB during the attacks   oa- PRN ULTRAM  Alcohol use- he state down to 3 a night   Continues to smoke            Diet review for  nutrition referral? Yes ____ Not Indicated __x__  Patient Instructions (the written plan) was given to the patient.  Medicare Attestation  I have personally reviewed:  The patient's medical and social history  Their use of alcohol, tobacco or illicit drugs  Their current medications and supplements  The patient's functional ability including ADLs,fall risks, home safety risks, cognitive, and hearing and visual impairment  Diet and physical activities  Evidence for depression or mood disorders  The patient's weight, height, BMI, and visual acuity have been recorded in the chart. I have made referrals, counseling, and provided education to the patient based on review of the above and I have provided the patient with a written personalized care plan for preventive services.

## 2019-07-07 NOTE — Patient Instructions (Signed)
F/U 4 months  Flu shot given We will call with lab results

## 2019-07-08 LAB — BASIC METABOLIC PANEL
BUN: 9 mg/dL (ref 7–25)
CO2: 27 mmol/L (ref 20–32)
Calcium: 10.2 mg/dL (ref 8.6–10.3)
Chloride: 94 mmol/L — ABNORMAL LOW (ref 98–110)
Creat: 0.9 mg/dL (ref 0.70–1.18)
Glucose, Bld: 132 mg/dL — ABNORMAL HIGH (ref 65–99)
Potassium: 4.7 mmol/L (ref 3.5–5.3)
Sodium: 133 mmol/L — ABNORMAL LOW (ref 135–146)

## 2019-07-08 LAB — CBC WITH DIFFERENTIAL/PLATELET
Absolute Monocytes: 403 cells/uL (ref 200–950)
Basophils Absolute: 72 cells/uL (ref 0–200)
Basophils Relative: 1.5 %
Eosinophils Absolute: 182 cells/uL (ref 15–500)
Eosinophils Relative: 3.8 %
HCT: 45 % (ref 38.5–50.0)
Hemoglobin: 15.3 g/dL (ref 13.2–17.1)
Lymphs Abs: 634 cells/uL — ABNORMAL LOW (ref 850–3900)
MCH: 31.4 pg (ref 27.0–33.0)
MCHC: 34 g/dL (ref 32.0–36.0)
MCV: 92.2 fL (ref 80.0–100.0)
MPV: 9.8 fL (ref 7.5–12.5)
Monocytes Relative: 8.4 %
Neutro Abs: 3509 cells/uL (ref 1500–7800)
Neutrophils Relative %: 73.1 %
Platelets: 466 10*3/uL — ABNORMAL HIGH (ref 140–400)
RBC: 4.88 10*6/uL (ref 4.20–5.80)
RDW: 13.9 % (ref 11.0–15.0)
Total Lymphocyte: 13.2 %
WBC: 4.8 10*3/uL (ref 3.8–10.8)

## 2019-07-08 LAB — PSA: PSA: 0.4 ng/mL (ref <=4.0)

## 2019-07-09 ENCOUNTER — Encounter: Payer: Self-pay | Admitting: Family Medicine

## 2019-07-10 ENCOUNTER — Other Ambulatory Visit: Payer: Self-pay | Admitting: Family Medicine

## 2019-07-10 ENCOUNTER — Encounter: Payer: Self-pay | Admitting: *Deleted

## 2019-07-10 ENCOUNTER — Other Ambulatory Visit (INDEPENDENT_AMBULATORY_CARE_PROVIDER_SITE_OTHER): Payer: Self-pay | Admitting: Specialist

## 2019-07-10 DIAGNOSIS — J418 Mixed simple and mucopurulent chronic bronchitis: Secondary | ICD-10-CM

## 2019-07-17 ENCOUNTER — Other Ambulatory Visit: Payer: Self-pay | Admitting: Emergency Medicine

## 2019-07-20 ENCOUNTER — Ambulatory Visit (INDEPENDENT_AMBULATORY_CARE_PROVIDER_SITE_OTHER)
Admission: RE | Admit: 2019-07-20 | Discharge: 2019-07-20 | Disposition: A | Discharge status: Home / Self Care | Payer: Medicare HMO | Source: Ambulatory Visit | Attending: Adult Health | Admitting: Adult Health

## 2019-07-20 ENCOUNTER — Other Ambulatory Visit: Payer: Self-pay

## 2019-07-20 DIAGNOSIS — Z87891 Personal history of nicotine dependence: Secondary | ICD-10-CM | POA: Diagnosis not present

## 2019-07-20 DIAGNOSIS — I700 Atherosclerosis of aorta: Secondary | ICD-10-CM | POA: Diagnosis not present

## 2019-07-20 DIAGNOSIS — J438 Other emphysema: Secondary | ICD-10-CM

## 2019-07-20 DIAGNOSIS — Z720 Tobacco use: Secondary | ICD-10-CM

## 2019-07-20 DIAGNOSIS — I313 Pericardial effusion (noninflammatory): Secondary | ICD-10-CM | POA: Diagnosis not present

## 2019-07-20 DIAGNOSIS — I2584 Coronary atherosclerosis due to calcified coronary lesion: Secondary | ICD-10-CM

## 2019-07-21 DIAGNOSIS — J449 Chronic obstructive pulmonary disease, unspecified: Secondary | ICD-10-CM | POA: Diagnosis not present

## 2019-07-25 NOTE — Progress Notes (Signed)
Called spoke with patient, advised of CT results / recs as stated by [place] NP.  Pt verbalized understanding and denied any questions.  Orders only encounter created for CT order.

## 2019-07-28 ENCOUNTER — Ambulatory Visit (INDEPENDENT_AMBULATORY_CARE_PROVIDER_SITE_OTHER): Payer: Medicare HMO | Admitting: Emergency Medicine

## 2019-07-28 ENCOUNTER — Encounter: Payer: Self-pay | Admitting: Emergency Medicine

## 2019-07-28 ENCOUNTER — Other Ambulatory Visit: Payer: Self-pay

## 2019-07-28 DIAGNOSIS — J449 Chronic obstructive pulmonary disease, unspecified: Secondary | ICD-10-CM | POA: Diagnosis not present

## 2019-07-28 NOTE — Progress Notes (Signed)
Virtual Visit via Telephone Note  I connected with Zachary Chambers Zachary Chambers on 07/28/19 at  2:45 PM EST by telephone and verified that I am speaking with the correct person using two identifiers.  Location: Patient: Home Provider: Office   I discussed the limitations, risks, security and privacy concerns of performing an evaluation and management service by telephone and the availability of in person appointments. I also discussed with the patient that there may be a patient responsible charge related to this service. The patient expressed understanding and agreed to proceed.   History of Present Illness: 68 year old man, active smoker with a history of COPD.  Last seen by me 08/25/2018.   Observations/Objective: Changed him to Trelegy in January 2020.  He seems to have benefited and this has been continued. he does still have exertional SOB, but he is able to keep on walking. No real cough except when he does nebulized albuterol, uses about bid. Used his albuterol HFA as well. He is smoking less than a pack a day. PNA shot up to date.   Assessment .and Plan: Continue the Trelegy, albuterol as needed.  Vaccinations up to date Discussed smoking cessation with him today - he does not really seem motivated to decrease. I asked him to try again to get to 15 cig a day  Follow Up Instructions: 6 months.    I discussed the assessment and treatment plan with the patient. The patient was provided an opportunity to ask questions and all were answered. The patient agreed with the plan and demonstrated an understanding of the instructions.   The patient was advised to call back or seek an in-person evaluation if the symptoms worsen or if the condition fails to improve as anticipated.  I provided 15 minutes of non-face-to-face time during this encounter.   Collene Gobble, MD

## 2019-08-21 DIAGNOSIS — J449 Chronic obstructive pulmonary disease, unspecified: Secondary | ICD-10-CM | POA: Diagnosis not present

## 2019-08-23 ENCOUNTER — Other Ambulatory Visit: Payer: Self-pay | Admitting: Family Medicine

## 2019-09-21 DIAGNOSIS — J449 Chronic obstructive pulmonary disease, unspecified: Secondary | ICD-10-CM | POA: Diagnosis not present

## 2019-09-26 ENCOUNTER — Other Ambulatory Visit: Payer: Self-pay | Admitting: *Deleted

## 2019-09-26 MED ORDER — ALBUTEROL SULFATE HFA 108 (90 BASE) MCG/ACT IN AERS: INHALATION_SPRAY | RESPIRATORY_TRACT | 3 refills | Status: DC

## 2019-10-05 ENCOUNTER — Other Ambulatory Visit: Payer: Self-pay | Admitting: Family Medicine

## 2019-10-11 ENCOUNTER — Other Ambulatory Visit: Payer: Self-pay | Admitting: Family Medicine

## 2019-10-19 DIAGNOSIS — J449 Chronic obstructive pulmonary disease, unspecified: Secondary | ICD-10-CM | POA: Diagnosis not present

## 2019-10-25 ENCOUNTER — Other Ambulatory Visit: Payer: Self-pay | Admitting: Emergency Medicine

## 2019-10-30 ENCOUNTER — Telehealth: Payer: Self-pay | Admitting: Adult Health

## 2019-10-30 NOTE — Telephone Encounter (Signed)
Ok noted.  Zachary Chambers

## 2019-10-30 NOTE — Telephone Encounter (Signed)
Called and spoke to pt. Pt states he was told by the pharmacy that the frequency for his albuterol hfa inhaler is for q6h prn and not q4h prn. Advised pt that it can be scripted for q4-6h prn and is at the providers discretion. Advised pt that he should not be needing to use his rescue inhaler that often and to only use it when he is wheezing or SOB that does not resolve with rest. Pt states he still needs the rescue inhaler q4h. Pt states he is taking Symbicort and Spiriva and it is working better for him than the Trelegy. Pt last seen in 07/2019 by Dr. Lamonte Sakai, because pt is c/o refractory SOB - an appt was made with Wyn Quaker, NP, for a virtual visit on 3/16. Pt states he cannot wear a mask. Pt was not in any distress while speaking on the phone, could complete full sentences. Pt aware to seek emergency help if any s/s worsen.   Will forward to Wyn Quaker, NP, as Juluis Rainier.

## 2019-10-31 ENCOUNTER — Ambulatory Visit (INDEPENDENT_AMBULATORY_CARE_PROVIDER_SITE_OTHER): Payer: Medicare HMO | Admitting: Pulmonary Disease

## 2019-10-31 ENCOUNTER — Encounter: Payer: Self-pay | Admitting: Pulmonary Disease

## 2019-10-31 ENCOUNTER — Other Ambulatory Visit: Payer: Self-pay

## 2019-10-31 DIAGNOSIS — F17210 Nicotine dependence, cigarettes, uncomplicated: Secondary | ICD-10-CM | POA: Diagnosis not present

## 2019-10-31 DIAGNOSIS — F17200 Nicotine dependence, unspecified, uncomplicated: Secondary | ICD-10-CM

## 2019-10-31 DIAGNOSIS — J449 Chronic obstructive pulmonary disease, unspecified: Secondary | ICD-10-CM

## 2019-10-31 MED ORDER — DOXYCYCLINE HYCLATE 100 MG PO TABS: 100.0000 mg | ORAL_TABLET | Freq: Two times a day (BID) | ORAL | 0 refills | Status: DC

## 2019-10-31 MED ORDER — PREDNISONE 10 MG PO TABS: ORAL_TABLET | ORAL | 0 refills | Status: DC

## 2019-10-31 NOTE — Progress Notes (Signed)
Virtual Visit via Telephone Note  I connected with Zachary Chambers on 10/31/19 at  2:00 PM EDT by telephone and verified that I am speaking with the correct person using two identifiers.  Location: Patient: Home Provider: Office [facility] - [id number], [place], [place], [place] [address]   I discussed the limitations, risks, security and privacy concerns of performing an evaluation and management service by telephone and the availability of in person appointments. I also discussed with the patient that there may be a patient responsible charge related to this service. The patient expressed understanding and agreed to proceed.  Patient consented to consult via telephone: Yes People present and their role in pt care: Pt    History of Present Illness:  69 year old male current every day smoker followed in our office for COPD  Past medical history: CAD, GERD, alcohol abuse, tobacco abuse Smoking history: Current everyday smoker.  Smoking 1 pack/day.  40-pack-year smoking history Maintenance: Symbicort 160, Spiriva Handihaler Patient of Dr. Lamonte Sakai  Chief complaint: Acute visit/COPD follow-up  69 year old male current everyday smoker followed in our office for COPD contacted our office yesterday reporting he has had increased use of his rescue inhaler.  He is having worsened symptoms of his breathing a televisit was scheduled today to further evaluate.  Patient completing televisit with our office today.  He reports that he is recently switch back to Spiriva HandiHaler and Symbicort 160.  He prefers this over Trelegy Ellipta.  He feels that he has had improved dyspnea since switching back to these inhalers.  Unfortunately he does still continue to use his rescue inhaler every 4-6 hours.  He has increased congestion, wheezing and cough.  He has not had any recent steroid use or antibiotics.  He reports that he typically has the symptoms with change of the weather.  He also has increased  sinus pressure.  Patient does still continue to smoke 1 pack/day.  He has been unsuccessful with decreasing on smoking.  He recently lost his father in January/2021.  He is willing to try stopping smoking.   Smoking assessment and cessation counseling  Patient currently smoking: 1 ppd I have advised the patient to quit/stop smoking as soon as possible due to high risk for multiple medical problems.  It will also be very difficult for [place] to manage patient's  respiratory symptoms and status if we continue to expose her lungs to a known irritant.  We do not advise e-cigarettes as a form of stopping smoking.  Patient is willing to quit smoking. Hasnt set quit date.   Has tried NRT in the past: gum made him sick, patch didn't work  Never tried nicotine replacement therapies in combination Needs cigarette in first 85min  Never tried Chantix Never tried Wellbutrin  I have advised the patient that we can assist and have options of nicotine replacement therapy, provided smoking cessation education today, provided smoking cessation counseling, and provided cessation resources.  Follow-up next office visit office visit for assessment of smoking cessation.   Smoking cessation counseling advised for: 6 min     Observations/Objective:  07/07/2019-CBC with differential-eosinophils relative 3.8, eosinophils absolute 182  07/20/2019-CT chest lung cancer screening-lung RADS 2, continue annual screening, centrilobular and paraseptal emphysema  07/18/2013-pulmonary function test-FVC 3.04 (70% predicted), postbronchodilator ratio 47, postbronchodilator FEV1 1.42 (44% predicted), no bronchodilator response, DLCO 14.22 (47% predicted)  Social History   Tobacco Use  Smoking Status Current Every Day Smoker  . Packs/day: 1.00  . Years:  40.00  . Pack years: 40.00  . Types: Cigarettes  Smokeless Tobacco Never Used   Immunization History  Administered Date(s) Administered  . Fluad Quad(high Dose 65+)  07/07/2019  . Influenza Whole 07/25/2009  . Influenza, High Dose Seasonal PF 09/03/2017, 08/05/2018  . Influenza,inj,Quad PF,6+ Mos 06/19/2013, 08/15/2014, 07/17/2015, 07/15/2016  . Pneumococcal Conjugate-13 08/15/2014  . Pneumococcal Polysaccharide-23 08/26/2016  . Zoster Recombinat (Shingrix) 03/07/2018, 06/15/2018      Assessment and Plan:  COPD (chronic obstructive pulmonary disease) plan: Doxycycline today Prednisone taper today Stop Trelegy Ellipta Restart Symbicort 160 Restart Spiriva HandiHaler 6-week follow-up in our office Start working with clinical pharmacy team on smoking cessation Complete lung cancer screening CT in December/2021  TOBACCO ABUSE December/2020 lung RADS 2 Current smoker 1 pack/day Has tried nicotine replacement therapies separately: Gum caused nausea, he was not chewing it correctly Patch he did not see much significant improvement Has never tried Chantix or Wellbutrin Is willing to consider stopping  Plan: We will coordinate a 1 to 2-week telephonic out reach from the clinical pharmacy team for smoking cessation Emphasized need for the patient to stop smoking Emphasized need for reduced to quit methods 6-week follow-up in our office Repeat lung cancer screening CT as ordered in December/2021   Follow Up Instructions:  Return in about 6 weeks (around 12/12/2019), or if symptoms worsen or fail to improve, for Follow up with Dr. Lamonte Sakai, Follow up with Wyn Quaker FNP-C, clinical pharmacy team appt.   I discussed the assessment and treatment plan with the patient. The patient was provided an opportunity to ask questions and all were answered. The patient agreed with the plan and demonstrated an understanding of the instructions.   The patient was advised to call back or seek an in-person evaluation if the symptoms worsen or if the condition fails to improve as anticipated.  I provided 24 minutes of non-face-to-face time during this  encounter.   Lauraine Rinne, NP

## 2019-10-31 NOTE — Assessment & Plan Note (Signed)
plan: Doxycycline today Prednisone taper today Stop Trelegy Ellipta Restart Symbicort 160 Restart Spiriva HandiHaler 6-week follow-up in our office Start working with clinical pharmacy team on smoking cessation Complete lung cancer screening CT in December/2021

## 2019-10-31 NOTE — Assessment & Plan Note (Signed)
December/2020 lung RADS 2 Current smoker 1 pack/day Has tried nicotine replacement therapies separately: Gum caused nausea, he was not chewing it correctly Patch he did not see much significant improvement Has never tried Chantix or Wellbutrin Is willing to consider stopping  Plan: We will coordinate a 1 to 2-week telephonic out reach from the clinical pharmacy team for smoking cessation Emphasized need for the patient to stop smoking Emphasized need for reduced to quit methods 6-week follow-up in our office Repeat lung cancer screening CT as ordered in December/2021

## 2019-10-31 NOTE — Patient Instructions (Addendum)
You were seen today by Lauraine Rinne, NP  for:   It was nice talking with you today.  I am sorry to hear about the loss of your father.  We look forward to working with you on stopping smoking.  We will treat you today as a COPD exacerbation with antibiotics and steroids.  Please continue your Symbicort 160 and Spiriva HandiHaler as outlined.  We will have close follow-up with you in our office in about 6 weeks to ensure you are getting better.  Sooner if you need it.  Take care of yourself and stay safe,  Lexandra Rettke  1. Chronic obstructive pulmonary disease, unspecified COPD type (HCC)  - doxycycline (VIBRA-TABS) 100 MG tablet; Take 1 tablet (100 mg total) by mouth 2 (two) times daily.  Dispense: 14 tablet; Refill: 0 - predniSONE (DELTASONE) 10 MG tablet; 4 tabs for 2 days, then 3 tabs for 2 days, 2 tabs for 2 days, then 1 tab for 2 days, then stop  Dispense: 20 tablet; Refill: 0  Stop Trelegy Ellipta  Restart:  Continue Symbicort 160 >>> 2 puffs in the morning right when you wake up, rinse out your mouth after use, 12 hours later 2 puffs, rinse after use >>> Take this daily, no matter what >>> This is not a rescue inhaler   Spiriva Handihaler 49mcg capsule >>> take 2 puffs using the inhaler and 1 capsule daily  >>> rinse mouth out after use  >>> take daily no matter what  >>> this is not a rescue inhaler   Note your daily symptoms > remember "red flags" for COPD:   >>>Increase in cough >>>increase in sputum production >>>increase in shortness of breath or activity  intolerance.   If you notice these symptoms, please call the office to be seen.    2. TOBACCO ABUSE  We recommend that you stop smoking.  >>>You need to set a quit date >>>If you have friends or family who smoke, let them know you are trying to quit and not to smoke around you or in your living environment  Smoking Cessation Resources:  1 800 QUIT NOW  >>> Patient to call this resource and utilize it to help support  her quit smoking >>> Keep up your hard work with stopping smoking  You can also contact the [facility] >>>For smoking cessation classes call [phone]  We do not recommend using e-cigarettes as a form of stopping smoking  You can sign up for smoking cessation support texts and information:  >>>https://smokefree.gov/smokefreetxt  Complete lung cancer screening CT in December/2021 as scheduled   We recommend today:   Meds ordered this encounter  Medications  . doxycycline (VIBRA-TABS) 100 MG tablet    Sig: Take 1 tablet (100 mg total) by mouth 2 (two) times daily.    Dispense:  14 tablet    Refill:  0  . predniSONE (DELTASONE) 10 MG tablet    Sig: 4 tabs for 2 days, then 3 tabs for 2 days, 2 tabs for 2 days, then 1 tab for 2 days, then stop    Dispense:  20 tablet    Refill:  0    Follow Up:    Return in about 6 weeks (around 12/12/2019), or if symptoms worsen or fail to improve, for Follow up with Dr. Lamonte Sakai, Follow up with Wyn Quaker FNP-C, clinical pharmacy team appt.  Please completed a televisit in 1-2 weeks for an appointment with the clinical pharmacy team for:  . Smoking  cessation  Please present to our office in 6 weeks for an appointment with the clinical pharmacy team for:  . Smoking cessation . Medication Management - breztri trial? . Medication reconciliation  . Inhaler teaching     Please do your part to reduce the spread of COVID-19:      Reduce your risk of any infection  and COVID19 by using the similar precautions used for avoiding the common cold or flu:  Marland Kitchen Wash your hands often with soap and warm water for at least 20 seconds.  If soap and water are not readily available, use an alcohol-based hand sanitizer with at least 60% alcohol.  . If coughing or sneezing, cover your mouth and nose by coughing or sneezing into the elbow areas of your shirt or coat, into a tissue or into your sleeve (not your hands). Langley Gauss A MASK when in  public  . Avoid shaking hands with others and consider head nods or verbal greetings only. . Avoid touching your eyes, nose, or mouth with unwashed hands.  . Avoid close contact with people who are sick. . Avoid places or events with large numbers of people in one location, like concerts or sporting events. . If you have some symptoms but not all symptoms, continue to monitor at home and seek medical attention if your symptoms worsen. . If you are having a medical emergency, call 911.   [place] / e-Visit: [email or web address]         [facility]: [facility]: [id number]                   [facility]: [id number]     It is flu season:   >>> Best ways to protect herself from the flu: Receive the yearly flu vaccine, practice good hand hygiene washing with soap and also using hand sanitizer when available, eat a nutritious meals, get adequate rest, hydrate appropriately   Please contact the office if your symptoms worsen or you have concerns that you are not improving.   Thank you for choosing [facility] for your healthcare, and for allowing [place] to partner with you on your healthcare journey. I am thankful to be able to provide care to you today.   Wyn Quaker FNP-C

## 2019-11-01 MED ORDER — BUDESONIDE-FORMOTEROL FUMARATE 160-4.5 MCG/ACT IN AERO: 2.0000 | INHALATION_SPRAY | Freq: Two times a day (BID) | RESPIRATORY_TRACT | 5 refills | Status: DC

## 2019-11-01 MED ORDER — SPIRIVA HANDIHALER 18 MCG IN CAPS: 18.0000 ug | ORAL_CAPSULE | Freq: Every day | RESPIRATORY_TRACT | 5 refills | Status: DC

## 2019-11-01 NOTE — Addendum Note (Signed)
Addended by: Valerie Salts on: 11/01/2019 08:51 AM   Modules accepted: Orders

## 2019-11-06 ENCOUNTER — Other Ambulatory Visit: Payer: Self-pay

## 2019-11-06 ENCOUNTER — Encounter: Payer: Self-pay | Admitting: Family Medicine

## 2019-11-06 ENCOUNTER — Ambulatory Visit (INDEPENDENT_AMBULATORY_CARE_PROVIDER_SITE_OTHER): Payer: Medicare HMO | Admitting: Family Medicine

## 2019-11-06 VITALS — BP 126/64 | HR 100 | Temp 98.1°F | Resp 20 | Ht 68.0 in | Wt 129.0 lb

## 2019-11-06 DIAGNOSIS — F17200 Nicotine dependence, unspecified, uncomplicated: Secondary | ICD-10-CM

## 2019-11-06 DIAGNOSIS — I25110 Atherosclerotic heart disease of native coronary artery with unstable angina pectoris: Secondary | ICD-10-CM

## 2019-11-06 DIAGNOSIS — K219 Gastro-esophageal reflux disease without esophagitis: Secondary | ICD-10-CM

## 2019-11-06 DIAGNOSIS — J449 Chronic obstructive pulmonary disease, unspecified: Secondary | ICD-10-CM

## 2019-11-06 DIAGNOSIS — Z789 Other specified health status: Secondary | ICD-10-CM | POA: Diagnosis not present

## 2019-11-06 DIAGNOSIS — F1090 Alcohol use, unspecified, uncomplicated: Secondary | ICD-10-CM

## 2019-11-06 LAB — CBC WITH DIFFERENTIAL/PLATELET
Absolute Monocytes: 391 cells/uL (ref 200–950)
Basophils Absolute: 31 cells/uL (ref 0–200)
Basophils Relative: 0.5 %
Eosinophils Absolute: 12 cells/uL — ABNORMAL LOW (ref 15–500)
Eosinophils Relative: 0.2 %
HCT: 42 % (ref 38.5–50.0)
Hemoglobin: 14.6 g/dL (ref 13.2–17.1)
Lymphs Abs: 787 cells/uL — ABNORMAL LOW (ref 850–3900)
MCH: 32.7 pg (ref 27.0–33.0)
MCHC: 34.8 g/dL (ref 32.0–36.0)
MCV: 94.2 fL (ref 80.0–100.0)
MPV: 9.9 fL (ref 7.5–12.5)
Monocytes Relative: 6.3 %
Neutro Abs: 4979 cells/uL (ref 1500–7800)
Neutrophils Relative %: 80.3 %
Platelets: 446 10*3/uL — ABNORMAL HIGH (ref 140–400)
RBC: 4.46 10*6/uL (ref 4.20–5.80)
RDW: 13.1 % (ref 11.0–15.0)
Total Lymphocyte: 12.7 %
WBC: 6.2 10*3/uL (ref 3.8–10.8)

## 2019-11-06 LAB — COMPREHENSIVE METABOLIC PANEL
AG Ratio: 1.5 (calc) (ref 1.0–2.5)
ALT: 8 U/L — ABNORMAL LOW (ref 9–46)
AST: 18 U/L (ref 10–35)
Albumin: 4.1 g/dL (ref 3.6–5.1)
Alkaline phosphatase (APISO): 60 U/L (ref 35–144)
BUN: 15 mg/dL (ref 7–25)
CO2: 27 mmol/L (ref 20–32)
Calcium: 9.9 mg/dL (ref 8.6–10.3)
Chloride: 97 mmol/L — ABNORMAL LOW (ref 98–110)
Creat: 0.83 mg/dL (ref 0.70–1.18)
Globulin: 2.8 g/dL (calc) (ref 1.9–3.7)
Glucose, Bld: 115 mg/dL — ABNORMAL HIGH (ref 65–99)
Potassium: 4.4 mmol/L (ref 3.5–5.3)
Sodium: 134 mmol/L — ABNORMAL LOW (ref 135–146)
Total Bilirubin: 0.6 mg/dL (ref 0.2–1.2)
Total Protein: 6.9 g/dL (ref 6.1–8.1)

## 2019-11-06 LAB — LIPID PANEL
Cholesterol: 137 mg/dL (ref <200)
HDL: 64 mg/dL (ref >=40)
LDL Cholesterol (Calc): 60 mg/dL (calc)
Non-HDL Cholesterol (Calc): 73 mg/dL (calc) (ref <130)
Total CHOL/HDL Ratio: 2.1 (calc) (ref <5.0)
Triglycerides: 51 mg/dL (ref <150)

## 2019-11-06 NOTE — Assessment & Plan Note (Signed)
Asymptomatic.  He is due for yearly follow-up with his cardiologist he is going to schedule this.  We will check his fasting lipid panel metabolic panel today.  Continue current medications for blood pressure which is controlled

## 2019-11-06 NOTE — Assessment & Plan Note (Signed)
He has been cutting back on his tobacco use.  He has been referred to cessation clinic

## 2019-11-06 NOTE — Patient Instructions (Addendum)
Call your Heart doctor for a visit  F/U 6 months

## 2019-11-06 NOTE — Assessment & Plan Note (Signed)
Discussed how he has been to take his medications prescribed by pulmonary for the acute exacerbation.  Take the doxycycline twice a day continue the prednisone taper.

## 2019-11-06 NOTE — Assessment & Plan Note (Signed)
He continues to benefit from his PPI symptoms are controlled no changes

## 2019-11-06 NOTE — Assessment & Plan Note (Signed)
He is cutting back on his alcohol now down to 3-4 beers a night.

## 2019-11-06 NOTE — Progress Notes (Signed)
   Subjective:    Patient ID: Zachary Chambers, male    DOB: 1951/02/18, 69 y.o.   MRN: [id number]  Patient presents for Follow-up (is fasting)  Pt here to f/u chronic medical problems  He was seen by pulmonary last week on the 16th., given prednisone taper and doxycycline, he didn't start until the end of the week, now on day 3 of his taper. He was also changed back to symbicort and spiriva , since Trelegy didn't work  He has been cutting a back on cigarrettes, down to 12 a day   CAD- taking statin drug   Chronic pain- taking ultram as needed   GERD- taking prilosec and symptoms are controlled   EtOH use he has cut down to 3-4 beers a night  No new concerns Review Of Systems:  GEN- denies fatigue, fever, weight loss,weakness, recent illness HEENT- denies eye drainage, change in vision, nasal discharge, CVS- denies chest pain, palpitations RESP- denies SOB, cough, wheeze ABD- denies N/V, change in stools, abd pain GU- denies dysuria, hematuria, dribbling, incontinence MSK- denies joint pain, muscle aches, injury Neuro- denies headache, dizziness, syncope, seizure activity       Objective:    BP 126/64   Pulse 100   Temp 98.1 F (36.7 C) (Temporal)   Resp 20   Ht 5\' 8"  (1.727 m)   Wt 129 lb (58.5 kg)   SpO2 96%   BMI 19.61 kg/m  GEN- NAD, alert and oriented x3 HEENT- PERRL, EOMI, non injected sclera, pink conjunctiva, MMM, oropharynx clear Neck- Supple, no thryomegaly CVS- RRR, no murmur, distant sounds RESP-CTAB ABD-NABS,soft,NT,ND EXT- No edema Pulses- Radial       Assessment & Plan:  I Did discuss COVID-19 vaccination and his concerns regarding this.  At this time he does not want to be vaccinated Problem List Items Addressed This Visit      Unprioritized   CAD (coronary artery disease) - Primary    Asymptomatic.  He is due for yearly follow-up with his cardiologist he is going to schedule this.  We will check his fasting lipid panel metabolic panel today.   Continue current medications for blood pressure which is controlled      Relevant Orders   CBC with Differential/Platelet   Comprehensive metabolic panel   Lipid panel   COPD (chronic obstructive pulmonary disease) ([place])    Discussed how he has been to take his medications prescribed by pulmonary for the acute exacerbation.  Take the doxycycline twice a day continue the prednisone taper.      GERD (gastroesophageal reflux disease)    He continues to benefit from his PPI symptoms are controlled no changes      Heavy alcohol use    He is cutting back on his alcohol now down to 3-4 beers a night.      TOBACCO ABUSE    He has been cutting back on his tobacco use.  He has been referred to cessation clinic         Note: This dictation was prepared with Dragon dictation along with smaller phrase technology. Any transcriptional errors that result from this process are unintentional.

## 2019-11-07 ENCOUNTER — Telehealth: Payer: Self-pay | Admitting: *Deleted

## 2019-11-07 NOTE — Telephone Encounter (Signed)
Received call from patient.   Reports that he meant to ask for power operated scooter/ WC while in office for his unsteady gait and COPD.   Per PCP, patient needs to contact insurance provider to determine what company and what co-pay will be required.   Patient will then need another visit for a face to face for documentation in chart.   Of note, appt will need to be scheduled on the first week of April.

## 2019-11-09 NOTE — Telephone Encounter (Signed)
Received return call from patient.   Reports that he has contacted insurance and multiple DME providers.   States that DME providers report 6-9 month wait for scooters. States that he can bring prescription from PCP and pay out of pocket for scooter. Reports that prescription will take off the sales tax only.   Call placed to DME office- [facility]. Advised that patient does not require Face to Face notes, or Dx on prescription.   States that 3 wheel scooter is approximately $1000 before tax, and 4 wheel scooter is approximately $800 before tax.   Ok to give prescription?

## 2019-11-10 ENCOUNTER — Other Ambulatory Visit: Payer: Self-pay | Admitting: Family Medicine

## 2019-11-10 NOTE — Telephone Encounter (Signed)
Okay to write script for whichever one he wants, since he is paying out of pocket

## 2019-11-10 NOTE — Telephone Encounter (Signed)
Prescription printed.   Call placed to patient and patient made aware to pick up at front desk.

## 2019-11-19 DIAGNOSIS — J449 Chronic obstructive pulmonary disease, unspecified: Secondary | ICD-10-CM | POA: Diagnosis not present

## 2019-11-27 ENCOUNTER — Other Ambulatory Visit: Payer: Self-pay | Admitting: Family Medicine

## 2019-11-27 NOTE — Telephone Encounter (Signed)
Ok to refill 

## 2019-12-19 DIAGNOSIS — J449 Chronic obstructive pulmonary disease, unspecified: Secondary | ICD-10-CM | POA: Diagnosis not present

## 2019-12-28 ENCOUNTER — Telehealth: Payer: Self-pay | Admitting: Family Medicine

## 2019-12-28 NOTE — Chronic Care Management (AMB) (Signed)
  Chronic Care Management   Outreach Note  12/28/2019 Name: Zachary Chambers MRN: [id number] DOB: [birth year]  Referred by: [name], MD Reason for referral : Chronic Care Management   An unsuccessful telephone outreach was attempted today. The patient was referred to the pharmacist for assistance with care management and care coordination.   Follow Up Plan:   [place]

## 2020-01-19 DIAGNOSIS — J449 Chronic obstructive pulmonary disease, unspecified: Secondary | ICD-10-CM | POA: Diagnosis not present

## 2020-01-24 ENCOUNTER — Telehealth: Payer: Self-pay | Admitting: Family Medicine

## 2020-01-24 NOTE — Progress Notes (Signed)
  Chronic Care Management   Outreach Note  01/24/2020 Name: Zachary Chambers MRN: [id number] DOB: 02-17-1951  Referred by: [name], MD Reason for referral : No chief complaint on file.   A second unsuccessful telephone outreach was attempted today. The patient was referred to pharmacist for assistance with care management and care coordination.  Follow Up Plan:   [place]

## 2020-02-09 ENCOUNTER — Telehealth: Payer: Self-pay | Admitting: Family Medicine

## 2020-02-09 ENCOUNTER — Other Ambulatory Visit: Payer: Self-pay | Admitting: *Deleted

## 2020-02-09 MED ORDER — SIMVASTATIN 40 MG PO TABS: ORAL_TABLET | ORAL | 3 refills | Status: DC

## 2020-02-09 NOTE — Progress Notes (Signed)
  Chronic Care Management   Note  02/09/2020 Name: AYHAM WORD MRN: [id number] DOB: 30-Apr-1951  Hieu T Heggs is a 69 y.o. year old male who is a primary care patient of [place], Modena Nunnery, MD. I reached out to Janeann Merl by phone today in response to a referral sent by Mr. Zion T Deluna's PCP, [place], Modena Nunnery, MD.   Mr. Luft was given information about Chronic Care Management services today including:  1. CCM service includes personalized support from designated clinical staff supervised by his physician, including individualized plan of care and coordination with other care providers 2. 24/7 contact phone numbers for assistance for urgent and routine care needs. 3. Service will only be billed when office clinical staff spend 20 minutes or more in a month to coordinate care. 4. Only one practitioner may furnish and bill the service in a calendar month. 5. The patient may stop CCM services at any time (effective at the end of the month) by phone call to the office staff.   Patient agreed to services and verbal consent obtained.   Follow up plan:   [place]

## 2020-02-22 ENCOUNTER — Telehealth: Payer: Self-pay | Admitting: Pulmonary Disease

## 2020-02-22 MED ORDER — SPIRIVA HANDIHALER 18 MCG IN CAPS: 18.0000 ug | ORAL_CAPSULE | Freq: Every day | RESPIRATORY_TRACT | 5 refills | Status: DC

## 2020-02-22 MED ORDER — PREDNISONE 10 MG PO TABS: ORAL_TABLET | ORAL | 0 refills | Status: DC

## 2020-02-22 MED ORDER — ALBUTEROL SULFATE HFA 108 (90 BASE) MCG/ACT IN AERS: INHALATION_SPRAY | RESPIRATORY_TRACT | 3 refills | Status: DC

## 2020-02-22 NOTE — Telephone Encounter (Signed)
Definitely would restart his Spiriva.  May use albuterol as needed. Worried that his shortness of breath could also be related to heart issues as he has shortness of breath with exertion. Please keep appointment with [facility] NP however would be better for an in person visit versus telemedicine visit. Can call in prednisone 20 mg daily #5 for 5 days, take with food for possible COPD flare. However patient needs to go to the emergency room, urgent care or primary care to be seen sooner if symptoms do not improve or get worse.  Patient is to avoid heavy exercise or heavy lifting.  Please asked patient if he has any chest pain if he has any chest pain he is recommended to go to the emergency room immediately for evaluation  Please contact office for sooner follow up if symptoms do not improve or worsen or seek emergency care

## 2020-02-22 NOTE — Telephone Encounter (Signed)
Spoke with the pt  He is c/o increased SOB with exertion x 2 months  He gets SOB when he works out in the yard  He is wheezing some  He has used neb up to 4 x in 1 day but typically uses 2 x per day  No f/c/s, sore throat, body aches He has been using his symbicort but not spiriva  Stopped spiriva a few months ago bc he was not sure if he was supposed to be on it  I have refilled this for him as well as his rescue inhaler  I made televisit with BPM for 7/20- first available  Please advise thanks

## 2020-02-22 NOTE — Telephone Encounter (Signed)
Noted thank you! Zachary Chambers

## 2020-02-22 NOTE — Telephone Encounter (Signed)
Called and spoke with Patient. Tammy NP's recommendations given. Understanding stated.  Patient denies any chest pains at this time. Prednisone prescription sent to requested pharmacy.  Nothing further at this time.

## 2020-03-05 ENCOUNTER — Ambulatory Visit (INDEPENDENT_AMBULATORY_CARE_PROVIDER_SITE_OTHER): Payer: Medicare HMO | Admitting: Pulmonary Disease

## 2020-03-05 ENCOUNTER — Other Ambulatory Visit: Payer: Self-pay

## 2020-03-05 ENCOUNTER — Encounter: Payer: Self-pay | Admitting: Pulmonary Disease

## 2020-03-05 DIAGNOSIS — J449 Chronic obstructive pulmonary disease, unspecified: Secondary | ICD-10-CM

## 2020-03-05 DIAGNOSIS — F17200 Nicotine dependence, unspecified, uncomplicated: Secondary | ICD-10-CM | POA: Diagnosis not present

## 2020-03-05 DIAGNOSIS — Z0000 Encounter for general adult medical examination without abnormal findings: Secondary | ICD-10-CM | POA: Insufficient documentation

## 2020-03-05 MED ORDER — PREDNISONE 10 MG PO TABS: ORAL_TABLET | ORAL | 0 refills | Status: DC

## 2020-03-05 MED ORDER — AZITHROMYCIN 250 MG PO TABS: ORAL_TABLET | ORAL | 0 refills | Status: DC

## 2020-03-05 NOTE — Patient Instructions (Addendum)
You were seen today by Lauraine Rinne, NP  for:   1. Chronic obstructive pulmonary disease, unspecified COPD type ([place])  - predniSONE (DELTASONE) 10 MG tablet; 4 tabs for 2 days, then 3 tabs for 2 days, 2 tabs for 2 days, then 1 tab for 2 days, then stop  Dispense: 20 tablet; Refill: 0 - azithromycin (ZITHROMAX) 250 MG tablet; 500mg  (two tablets) today, then 250mg  (1 tablet) for the next 4 days  Dispense: 6 tablet; Refill: 0  Will order pulmonary function testing    Continue Symbicort 160 >>> 2 puffs in the morning right when you wake up, rinse out your mouth after use, 12 hours later 2 puffs, rinse after use >>> Take this daily, no matter what >>> This is not a rescue inhaler   Spiriva Respimat 1.25 >>> 2 puffs daily >>> Do this every day >>>This is not a rescue inhaler  2. TOBACCO ABUSE  We recommend that you stop smoking.  >>>You need to set a quit date >>>If you have friends or family who smoke, let them know you are trying to quit and not to smoke around you or in your living environment  Smoking Cessation Resources:  1 800 QUIT NOW  >>> Patient to call this resource and utilize it to help support her quit smoking >>> Keep up your hard work with stopping smoking  You can also contact the [facility] >>>For smoking cessation classes call [phone]  We do not recommend using e-cigarettes as a form of stopping smoking  You can sign up for smoking cessation support texts and information:  >>>https://smokefree.gov/smokefreetxt  3. Healthcare maintenance  We recommend that you receive the Covid19 vaccines, you refused   You need a Lung Cancer Screening CT in December / 2021  Schedule follow up with PCP to discuss healthcare power of attorney   We recommend today:   Meds ordered this encounter  Medications  . predniSONE (DELTASONE) 10 MG tablet    Sig: 4 tabs for 2 days, then 3 tabs for 2 days, 2 tabs for 2 days, then 1 tab for 2 days, then stop     Dispense:  20 tablet    Refill:  0  . azithromycin (ZITHROMAX) 250 MG tablet    Sig: 500mg  (two tablets) today, then 250mg  (1 tablet) for the next 4 days    Dispense:  6 tablet    Refill:  0    Follow Up:    Return in about 4 weeks (around 04/02/2020), or if symptoms worsen or fail to improve, for Follow up with Dr. Lamonte Sakai.   Please do your part to reduce the spread of COVID-19:      Reduce your risk of any infection  and COVID19 by using the similar precautions used for avoiding the common cold or flu:  Marland Kitchen Wash your hands often with soap and warm water for at least 20 seconds.  If soap and water are not readily available, use an alcohol-based hand sanitizer with at least 60% alcohol.  . If coughing or sneezing, cover your mouth and nose by coughing or sneezing into the elbow areas of your shirt or coat, into a tissue or into your sleeve (not your hands). Langley Gauss A MASK when in public  . Avoid shaking hands with others and consider head nods or verbal greetings only. . Avoid touching your eyes, nose, or mouth with unwashed hands.  . Avoid close contact with people who are sick. . Avoid places  or events with large numbers of people in one location, like concerts or sporting events. . If you have some symptoms but not all symptoms, continue to monitor at home and seek medical attention if your symptoms worsen. . If you are having a medical emergency, call 911.   [place] / e-Visit: [email or web address]         [facility]: [facility]: [phone]                   [facility]: [phone]     It is flu season:   >>> Best ways to protect herself from the flu: Receive the yearly flu vaccine, practice good hand hygiene washing with soap and also using hand sanitizer when available, eat a nutritious meals, get adequate rest,  hydrate appropriately   Please contact the office if your symptoms worsen or you have concerns that you are not improving.   Thank you for choosing [facility] for your healthcare, and for allowing [place] to partner with you on your healthcare journey. I am thankful to be able to provide care to you today.   Wyn Quaker FNP-C

## 2020-03-05 NOTE — Progress Notes (Signed)
Virtual Visit via Telephone Note  I connected with@ on 03/05/20 at  1:30 PM EDT by telephone and verified that I am speaking with the correct person using two identifiers.  Location: Patient: Home Provider: [facility] - [address], [place], [place],  [address]   I discussed the limitations, risks, security and privacy concerns of performing an evaluation and management service by telephone and the availability of in person appointments. I also discussed with the patient that there may be a patient responsible charge related to this service. The patient expressed understanding and agreed to proceed.  Patient consented to consult via telephone: Yes People present and their role in pt care: Pt     History of Present Illness:  69 year old male current every day smoker followed in our office for COPD  Past medical history: CAD, GERD, alcohol abuse, tobacco abuse Smoking history: Current everyday smoker.  Smoking 1 pack/day.  40-pack-year smoking history Maintenance: Symbicort 160, Spiriva Handihaler Patient of Dr. Lamonte Sakai  Chief complaint: Acute visit, shortness of breath   69 year old male current everyday smoker followed in our office for COPD.  He is followed by Dr. Lamonte Sakai.  He is currently maintained on Symbicort 160 and Spiriva HandiHaler.  He last completed follow-up with our office virtually in March/2021.  At that office visit was encouraged that he stop smoking.  He was planned to have a repeat CT of his chest in the lung cancer screening program December/2021 he was treated empirically with antibiotics and prednisone for suspected COPD exacerbation.  Patient reached by telephone visit today.  She reported increased shortness of breath and increased wheezing.  Patient feels fatigued.  He denies any recorded fevers.  Ultimately feels that he has had worsening shortness of breath over the last 1 to 2 weeks.  He reports adherence to his Symbicort 160 and Spiriva  HandiHaler.  Unfortunately patient does still continue to smoke.  Patient's significant other and Glennon Mac is also present on the telephone visit at the patient's request.  They are interested in completing paperwork for healthcare power of attorney.  We will recommend that they follow-up with primary care regarding best guidance for this.  Patient and significant other both have concerns regarding patient's need for oxygen.  They were hoping to get qualified for oxygen today over the phone.  We will discuss and review this.  Patient refuses to have the Covid vaccine   Observations/Objective:  07/07/2019-CBC with differential-eosinophils relative 3.8, eosinophils absolute 182  07/20/2019-CT chest lung cancer screening-lung RADS 2, continue annual screening, centrilobular and paraseptal emphysema  07/18/2013-pulmonary function test-FVC 3.04 (70% predicted), postbronchodilator ratio 47, postbronchodilator FEV1 1.42 (44% predicted), no bronchodilator response, DLCO 14.22 (47% predicted)   Social History   Tobacco Use  Smoking Status Current Every Day Smoker  . Packs/day: 1.00  . Years: 40.00  . Pack years: 40.00  . Types: Cigarettes  Smokeless Tobacco Never Used   Immunization History  Administered Date(s) Administered  . Fluad Quad(high Dose 65+) 07/07/2019  . Influenza Whole 07/25/2009  . Influenza, High Dose Seasonal PF 09/03/2017, 08/05/2018  . Influenza,inj,Quad PF,6+ Mos 06/19/2013, 08/15/2014, 07/17/2015, 07/15/2016  . Pneumococcal Conjugate-13 08/15/2014  . Pneumococcal Polysaccharide-23 08/26/2016  . Zoster Recombinat (Shingrix) 03/07/2018, 06/15/2018   Refuses covid vaccine   Assessment and Plan:  COPD (chronic obstructive pulmonary disease) Plan: Emphasized need to stop smoking Z-Pak today Prednisone taper today We will order pulmonary function testing to compare to 2014 PFTs Patient needs an office visit with  walk to assess oxygen needs Patient needs follow-up  with Dr. Lamonte Sakai We recommend the COVID-19 vaccine, patient declines this  Healthcare maintenance Plan: Recommend COVID-19 vaccines, patient declines Recommend stopping smoking, emphasized that this is the largest importance to helping with management of his breathing at this time  TOBACCO ABUSE Plan: Emphasized need to stop smoking   Follow Up Instructions:  Return in about 4 weeks (around 04/02/2020), or if symptoms worsen or fail to improve, for Follow up with Dr. Lamonte Sakai.   I discussed the assessment and treatment plan with the patient. The patient was provided an opportunity to ask questions and all were answered. The patient agreed with the plan and demonstrated an understanding of the instructions.   The patient was advised to call back or seek an in-person evaluation if the symptoms worsen or if the condition fails to improve as anticipated.  I provided 23 minutes of non-face-to-face time during this encounter.   Lauraine Rinne, NP

## 2020-03-05 NOTE — Assessment & Plan Note (Signed)
Plan: Recommend COVID-19 vaccines, patient declines Recommend stopping smoking, emphasized that this is the largest importance to helping with management of his breathing at this time

## 2020-03-05 NOTE — Assessment & Plan Note (Signed)
Plan: Emphasized need to stop smoking Z-Pak today Prednisone taper today We will order pulmonary function testing to compare to 2014 PFTs Patient needs an office visit with walk to assess oxygen needs Patient needs follow-up with Dr. Lamonte Sakai We recommend the COVID-19 vaccine, patient declines this

## 2020-03-05 NOTE — Assessment & Plan Note (Signed)
Plan: Emphasized need to stop smoking 

## 2020-03-06 ENCOUNTER — Other Ambulatory Visit: Payer: Self-pay

## 2020-03-06 ENCOUNTER — Emergency Department (HOSPITAL_COMMUNITY): Payer: Medicare HMO

## 2020-03-06 ENCOUNTER — Encounter (HOSPITAL_COMMUNITY): Payer: Self-pay | Admitting: Emergency Medicine

## 2020-03-06 ENCOUNTER — Observation Stay (HOSPITAL_COMMUNITY)
Admission: EM | Admit: 2020-03-06 | Discharge: 2020-03-08 | Disposition: A | Discharge status: Home / Self Care | Payer: Medicare HMO | Attending: Internal Medicine | Admitting: Internal Medicine

## 2020-03-06 DIAGNOSIS — Z66 Do not resuscitate: Secondary | ICD-10-CM | POA: Insufficient documentation

## 2020-03-06 DIAGNOSIS — R072 Precordial pain: Secondary | ICD-10-CM | POA: Diagnosis not present

## 2020-03-06 DIAGNOSIS — F17200 Nicotine dependence, unspecified, uncomplicated: Secondary | ICD-10-CM | POA: Diagnosis not present

## 2020-03-06 DIAGNOSIS — R079 Chest pain, unspecified: Secondary | ICD-10-CM | POA: Diagnosis not present

## 2020-03-06 DIAGNOSIS — J9601 Acute respiratory failure with hypoxia: Secondary | ICD-10-CM | POA: Insufficient documentation

## 2020-03-06 DIAGNOSIS — Z20822 Contact with and (suspected) exposure to covid-19: Secondary | ICD-10-CM | POA: Diagnosis not present

## 2020-03-06 DIAGNOSIS — Z7982 Long term (current) use of aspirin: Secondary | ICD-10-CM | POA: Diagnosis not present

## 2020-03-06 DIAGNOSIS — R0789 Other chest pain: Secondary | ICD-10-CM | POA: Diagnosis not present

## 2020-03-06 DIAGNOSIS — E43 Unspecified severe protein-calorie malnutrition: Secondary | ICD-10-CM | POA: Insufficient documentation

## 2020-03-06 DIAGNOSIS — Z87891 Personal history of nicotine dependence: Secondary | ICD-10-CM | POA: Diagnosis not present

## 2020-03-06 DIAGNOSIS — F1090 Alcohol use, unspecified, uncomplicated: Secondary | ICD-10-CM | POA: Diagnosis present

## 2020-03-06 DIAGNOSIS — E785 Hyperlipidemia, unspecified: Secondary | ICD-10-CM | POA: Insufficient documentation

## 2020-03-06 DIAGNOSIS — J449 Chronic obstructive pulmonary disease, unspecified: Secondary | ICD-10-CM | POA: Diagnosis not present

## 2020-03-06 DIAGNOSIS — Z955 Presence of coronary angioplasty implant and graft: Secondary | ICD-10-CM

## 2020-03-06 DIAGNOSIS — Z789 Other specified health status: Secondary | ICD-10-CM | POA: Diagnosis present

## 2020-03-06 DIAGNOSIS — F1020 Alcohol dependence, uncomplicated: Secondary | ICD-10-CM | POA: Diagnosis not present

## 2020-03-06 DIAGNOSIS — Z79899 Other long term (current) drug therapy: Secondary | ICD-10-CM | POA: Insufficient documentation

## 2020-03-06 LAB — BASIC METABOLIC PANEL
Anion gap: 10 (ref 5–15)
BUN: 11 mg/dL (ref 8–23)
CO2: 25 mmol/L (ref 22–32)
Calcium: 9.4 mg/dL (ref 8.9–10.3)
Chloride: 95 mmol/L — ABNORMAL LOW (ref 98–111)
Creatinine, Ser: 0.85 mg/dL (ref 0.61–1.24)
GFR calc Af Amer: >60 mL/min (ref >60)
GFR calc non Af Amer: >60 mL/min (ref >60)
Glucose, Bld: 123 mg/dL — ABNORMAL HIGH (ref 70–99)
Potassium: 3.7 mmol/L (ref 3.5–5.1)
Sodium: 130 mmol/L — ABNORMAL LOW (ref 135–145)

## 2020-03-06 LAB — HIV ANTIBODY (ROUTINE TESTING W REFLEX): HIV Screen 4th Generation wRfx: NONREACTIVE

## 2020-03-06 LAB — CBC
HCT: 45.8 % (ref 39.0–52.0)
Hemoglobin: 15.4 g/dL (ref 13.0–17.0)
MCH: 31.4 pg (ref 26.0–34.0)
MCHC: 33.6 g/dL (ref 30.0–36.0)
MCV: 93.5 fL (ref 80.0–100.0)
Platelets: 501 10*3/uL — ABNORMAL HIGH (ref 150–400)
RBC: 4.9 MIL/uL (ref 4.22–5.81)
RDW: 13.4 % (ref 11.5–15.5)
WBC: 9.2 10*3/uL (ref 4.0–10.5)
nRBC: 0 % (ref 0.0–0.2)

## 2020-03-06 LAB — TROPONIN I (HIGH SENSITIVITY)
Troponin I (High Sensitivity): 8 ng/L (ref <18)
Troponin I (High Sensitivity): 15 ng/L (ref <18)
Troponin I (High Sensitivity): 9 ng/L (ref <18)
Troponin I (High Sensitivity): 7 ng/L (ref <18)

## 2020-03-06 LAB — HEMOGLOBIN A1C
Hgb A1c MFr Bld: 5.4 % (ref 4.8–5.6)
Mean Plasma Glucose: 108.28 mg/dL

## 2020-03-06 LAB — TSH: TSH: 2.191 u[IU]/mL (ref 0.350–4.500)

## 2020-03-06 LAB — PHOSPHORUS: Phosphorus: 3.9 mg/dL (ref 2.5–4.6)

## 2020-03-06 LAB — SARS CORONAVIRUS 2 BY RT PCR (HOSPITAL ORDER, PERFORMED IN ~~LOC~~ HOSPITAL LAB): SARS Coronavirus 2: NEGATIVE

## 2020-03-06 LAB — MAGNESIUM: Magnesium: 1.8 mg/dL (ref 1.7–2.4)

## 2020-03-06 MED ORDER — THIAMINE HCL 100 MG PO TABS
100.0000 mg | ORAL_TABLET | Freq: Every day | ORAL | Status: DC
  Administered 2020-03-06 – 2020-03-08 (×2): 100 mg via ORAL
  Filled 2020-03-06 (×3): qty 1

## 2020-03-06 MED ORDER — THIAMINE HCL 100 MG/ML IJ SOLN: 100.0000 mg | Freq: Every day | INTRAMUSCULAR | Status: DC

## 2020-03-06 MED ORDER — FOLIC ACID 1 MG PO TABS
1.0000 mg | ORAL_TABLET | Freq: Every day | ORAL | Status: DC
  Administered 2020-03-06 – 2020-03-08 (×2): 1 mg via ORAL
  Filled 2020-03-06 (×3): qty 1

## 2020-03-06 MED ORDER — ONDANSETRON HCL 4 MG/2ML IJ SOLN: 4.0000 mg | Freq: Four times a day (QID) | INTRAMUSCULAR | Status: DC | PRN

## 2020-03-06 MED ORDER — ACETAMINOPHEN 325 MG PO TABS: 650.0000 mg | ORAL_TABLET | ORAL | Status: DC | PRN

## 2020-03-06 MED ORDER — PANTOPRAZOLE SODIUM 40 MG PO TBEC
40.0000 mg | DELAYED_RELEASE_TABLET | Freq: Every day | ORAL | Status: DC
  Administered 2020-03-07 – 2020-03-08 (×2): 40 mg via ORAL
  Filled 2020-03-06 (×2): qty 1

## 2020-03-06 MED ORDER — ASPIRIN EC 81 MG PO TBEC
81.0000 mg | DELAYED_RELEASE_TABLET | Freq: Every day | ORAL | Status: DC
  Administered 2020-03-06 – 2020-03-08 (×3): 81 mg via ORAL
  Filled 2020-03-06 (×3): qty 1

## 2020-03-06 MED ORDER — ADULT MULTIVITAMIN W/MINERALS CH
1.0000 | ORAL_TABLET | Freq: Every day | ORAL | Status: DC
  Administered 2020-03-06 – 2020-03-08 (×2): 1 via ORAL
  Filled 2020-03-06 (×3): qty 1

## 2020-03-06 MED ORDER — TRAMADOL HCL 50 MG PO TABS: 50.0000 mg | ORAL_TABLET | Freq: Four times a day (QID) | ORAL | Status: DC | PRN

## 2020-03-06 MED ORDER — IPRATROPIUM-ALBUTEROL 0.5-2.5 (3) MG/3ML IN SOLN
3.0000 mL | Freq: Four times a day (QID) | RESPIRATORY_TRACT | Status: DC
  Administered 2020-03-06: 3 mL via RESPIRATORY_TRACT
  Filled 2020-03-06: qty 3

## 2020-03-06 MED ORDER — LORAZEPAM 2 MG/ML IJ SOLN: 1.0000 mg | INTRAMUSCULAR | Status: DC | PRN

## 2020-03-06 MED ORDER — IPRATROPIUM-ALBUTEROL 0.5-2.5 (3) MG/3ML IN SOLN
3.0000 mL | Freq: Three times a day (TID) | RESPIRATORY_TRACT | Status: DC
  Administered 2020-03-07 (×3): 3 mL via RESPIRATORY_TRACT
  Filled 2020-03-06 (×3): qty 3

## 2020-03-06 MED ORDER — ALBUTEROL SULFATE (2.5 MG/3ML) 0.083% IN NEBU: 2.5000 mg | INHALATION_SOLUTION | RESPIRATORY_TRACT | Status: DC | PRN

## 2020-03-06 MED ORDER — SODIUM CHLORIDE 0.9% FLUSH: 3.0000 mL | Freq: Once | INTRAVENOUS | Status: DC

## 2020-03-06 MED ORDER — ALPRAZOLAM 0.25 MG PO TABS: 0.2500 mg | ORAL_TABLET | Freq: Two times a day (BID) | ORAL | Status: DC | PRN

## 2020-03-06 MED ORDER — LORAZEPAM 1 MG PO TABS: 1.0000 mg | ORAL_TABLET | ORAL | Status: DC | PRN

## 2020-03-06 MED ORDER — ATORVASTATIN CALCIUM 40 MG PO TABS
40.0000 mg | ORAL_TABLET | Freq: Every day | ORAL | Status: DC
  Administered 2020-03-06 – 2020-03-07 (×2): 40 mg via ORAL
  Filled 2020-03-06 (×2): qty 1

## 2020-03-06 MED ORDER — ENOXAPARIN SODIUM 40 MG/0.4ML ~~LOC~~ SOLN
40.0000 mg | SUBCUTANEOUS | Status: DC
  Administered 2020-03-06 – 2020-03-07 (×2): 40 mg via SUBCUTANEOUS
  Filled 2020-03-06 (×2): qty 0.4

## 2020-03-06 MED ORDER — PREDNISONE 20 MG PO TABS
40.0000 mg | ORAL_TABLET | Freq: Every day | ORAL | Status: DC
  Administered 2020-03-08: 40 mg via ORAL
  Filled 2020-03-06 (×2): qty 2

## 2020-03-06 MED ORDER — FLUTICASONE PROPIONATE 50 MCG/ACT NA SUSP
2.0000 | Freq: Every day | NASAL | Status: DC
  Administered 2020-03-07 – 2020-03-08 (×2): 2 via NASAL
  Filled 2020-03-06: qty 16

## 2020-03-06 MED ORDER — MOMETASONE FURO-FORMOTEROL FUM 200-5 MCG/ACT IN AERO
2.0000 | INHALATION_SPRAY | Freq: Two times a day (BID) | RESPIRATORY_TRACT | Status: DC
  Administered 2020-03-06 – 2020-03-08 (×4): 2 via RESPIRATORY_TRACT
  Filled 2020-03-06 (×2): qty 8.8

## 2020-03-06 NOTE — ED Notes (Signed)
Attempted report x1. 

## 2020-03-06 NOTE — ED Provider Notes (Signed)
[facility] EMERGENCY DEPARTMENT Provider Note   CSN: [id number] Arrival date & time: 03/06/20  0214     History Chief Complaint  Patient presents with  . Chest Pain    Zachary Chambers is a 69 y.o. male.  HPI      Presents with concern for chest pain, more like a pressure and feeling heart thumping, seemed like it was racing, felt like it was going fast, fiance felt it thumping through his chest Started at midnight, lasted about 1-2 hours Middle of chest with radiation up higher , felt it in left wrist (was trying to change showerhead before) COPD, dyspnea is normal, last 2 weeks felt sluggish, felt badly.  Cough the same. No nausea or vomiting Has had temperature off and on but not today Did not feel similar to when had stents placed before Took 2 nitroglycerin at home and had resolution of symptoms  Past Medical History:  Diagnosis Date  . COPD (chronic obstructive pulmonary disease) (HCC)    not on home O2  . DJD (degenerative joint disease)    diffusely  . GERD (gastroesophageal reflux disease)   . Hyperlipidemia   . MI (myocardial infarction) ([place]) 2006   small MI and stents x 3  Dr. Doreatha Lew  . PUD (peptic ulcer disease)    with bleeding  . Rotator cuff disorder    has been evaluated by Dr Clifton James and Koleen Distance    Patient Active Problem List   Diagnosis Date Noted  . Chest pain 03/06/2020  . Dyslipidemia 03/06/2020  . Healthcare maintenance 03/05/2020  . Heavy alcohol use 03/04/2018  . Memory changes 03/04/2018  . Leukopenia 05/04/2017  . Bruit 01/22/2014  . Muscle spasm of back 09/20/2013  . COPD (chronic obstructive pulmonary disease) ([place]) 01/14/2011  . CAD (coronary artery disease) 01/14/2011  . GERD (gastroesophageal reflux disease) 01/14/2011  . TOBACCO ABUSE 07/25/2009  . Osteoarthritis 07/25/2009  . ROTATOR CUFF SYNDROME, RIGHT 07/25/2009    Past Surgical History:  Procedure Laterality Date  . COLONOSCOPY    . EYE SURGERY      right eye removed at age 74  . heart stent     x3- 2006       Family History  Problem Relation Age of Onset  . Ovarian cancer Mother   . Diabetes Maternal Grandfather   . COPD Father   . Diabetes Paternal Grandfather   . Colon cancer Paternal Grandfather   . Esophageal cancer Neg Hx   . Rectal cancer Neg Hx   . Stomach cancer Neg Hx     Social History   Tobacco Use  . Smoking status: Former Smoker    Packs/day: 1.00    Years: 40.00    Pack years: 40.00    Types: Cigarettes    Quit date: 1945    Years since quitting: 76.6  . Smokeless tobacco: Never Used  . Tobacco comment: declines patch  Vaping Use  . Vaping Use: Never used  Substance Use Topics  . Alcohol use: Yes    Alcohol/week: 30.0 standard drinks    Types: 30 Cans of beer per week    Comment: h/o heavy use, now 4 beers per day  . Drug use: Not Currently    Home Medications Prior to Admission medications   Medication Sig Start Date End Date Taking? Authorizing Provider  albuterol (PROVENTIL) (2.5 MG/3ML) 0.083% nebulizer solution Take 3 mLs (2.5 mg total) by nebulization every 4 (four) hours as needed for  wheezing or shortness of breath. 01/17/19  Yes Parrett, Tammy S, NP  albuterol (VENTOLIN HFA) 108 (90 Base) MCG/ACT inhaler INHALE 2 PUFFS INTO THE LUNGS EVERY SIX HOURS AS NEEDED FOR FOR WHEEZING AND SHORTNESS OF BREATH Patient taking differently: Inhale 2 puffs into the lungs every 6 (six) hours as needed for wheezing or shortness of breath.  02/22/20  Yes Collene Gobble, MD  aspirin 81 MG tablet Take 81 mg by mouth at bedtime.    Yes [provider]  budesonide-formoterol (SYMBICORT) 160-4.5 MCG/ACT inhaler Inhale 2 puffs into the lungs 2 (two) times daily. 11/01/19  Yes Lauraine Rinne, NP  celecoxib (CELEBREX) 100 MG capsule TAKE 1 CAPSULE BY MOUTH 2 TIMES DAILY Patient taking differently: Take 100 mg by mouth 2 (two) times daily.  10/11/19  Yes [place], Modena Nunnery, MD  fluticasone (FLONASE) 50 MCG/ACT  nasal spray USE 2 SPRAYS IN EACH NOSTRIL EVERY DAY Patient taking differently: Place 2 sprays into both nostrils as needed for allergies.  03/02/19  Yes [place], Modena Nunnery, MD  Glucosamine HCl (GLUCOSAMINE PO) Take 1,500 mg by mouth daily.   Yes [provider]  hydrOXYzine (ATARAX/VISTARIL) 10 MG tablet TAKE 1 TABLET BY MOUTH 3 TIMES DAILY AS NEEDED Patient taking differently: Take 10 mg by mouth as needed for anxiety.  12/22/18  Yes Stoy, Modena Nunnery, MD  nitroGLYCERIN (NITROSTAT) 0.4 MG SL tablet dissolve 1 TABLET UNDER THE TONGUE every 5 minutes AS NEEDED CHEST PAIN Patient taking differently: Place 0.4 mg under the tongue every 5 (five) minutes as needed for chest pain.  12/05/18  Yes McCoy, Modena Nunnery, MD  omeprazole (PRILOSEC) 40 MG capsule TAKE 1 CAPSULE BY MOUTH EVERY DAY Patient taking differently: Take 40 mg by mouth daily.  10/06/19  Yes [place], Modena Nunnery, MD  simvastatin (ZOCOR) 40 MG tablet TAKE 1 TABLET BY MOUTH NIGHTLY AT BEDTIME Patient taking differently: Take 40 mg by mouth at bedtime.  02/09/20  Yes [place], Modena Nunnery, MD  tiotropium (SPIRIVA HANDIHALER) 18 MCG inhalation capsule Place 1 capsule (18 mcg total) into inhaler and inhale daily. 02/22/20  Yes [place], Rose Fillers, MD  tiZANidine (ZANAFLEX) 4 MG tablet TAKE 1 TABLET BY MOUTH EVERY EIGHT HOURS AS NEEDED FOR MUSCLE SPASMS Patient taking differently: Take 4 mg by mouth as needed for muscle spasms.  11/27/19  Yes [place], Modena Nunnery, MD  traMADol (ULTRAM) 50 MG tablet TAKE 1 TABLET BY MOUTH EVERY 6 HOURS AS NEEDED FOR moderate PAIN Patient taking differently: Take 50 mg by mouth as needed for moderate pain.  07/07/19  Yes [place], Modena Nunnery, MD  ALPRAZolam Duanne Moron) 0.25 MG tablet Take 1 tablet (0.25 mg total) by mouth 2 (two) times daily as needed for anxiety. Patient not taking: Reported on 03/06/2020 07/07/19   [name], MD  azithromycin (ZITHROMAX) 250 MG tablet 500mg  (two tablets) today, then 250mg  (1 tablet) for the  next 4 days Patient not taking: Reported on 03/06/2020 03/05/20   Lauraine Rinne, NP  clotrimazole-betamethasone (LOTRISONE) cream APPLY TO SKIN 2 TIMES DAILY Patient not taking: Reported on 03/06/2020 01/27/19   [name], MD  predniSONE (DELTASONE) 10 MG tablet 4 tabs for 2 days, then 3 tabs for 2 days, 2 tabs for 2 days, then 1 tab for 2 days, then stop Patient not taking: Reported on 03/06/2020 03/05/20   Lauraine Rinne, NP    Allergies    Patient has no known allergies.  Review of Systems  Review of Systems  Constitutional: Negative for appetite change and fever.  HENT: Negative for sore throat.   Eyes: Negative for visual disturbance.  Respiratory: Positive for cough and shortness of breath (unchanged).   Cardiovascular: Positive for chest pain and palpitations.  Gastrointestinal: Negative for abdominal pain, nausea and vomiting. Diarrhea: loose stool for 2 days.  Genitourinary: Negative for difficulty urinating.  Musculoskeletal: Negative for back pain and neck stiffness.  Skin: Negative for rash.  Neurological: Negative for dizziness, syncope and headaches.    Physical Exam Updated Vital Signs BP (!) 151/69 (BP Location: Right Arm)   Pulse 75   Temp 98 F (36.7 C) (Oral)   Resp 18   Ht 5\' 8"  (1.727 m)   Wt 58.5 kg   SpO2 97%   BMI 19.61 kg/m   Physical Exam Vitals and nursing note reviewed.  Constitutional:      General: He is not in acute distress.    Appearance: He is well-developed and underweight. He is not diaphoretic.  HENT:     Head: Normocephalic and atraumatic.  Eyes:     Conjunctiva/sclera: Conjunctivae normal.  Cardiovascular:     Rate and Rhythm: Normal rate and regular rhythm.     Heart sounds: Normal heart sounds. No murmur heard.  No friction rub. No gallop.   Pulmonary:     Effort: Pulmonary effort is normal. No respiratory distress.     Breath sounds: Normal breath sounds. No wheezing or rales.     Comments: diminished breath sounds  throughout, occasional wheeze  Abdominal:     General: There is no distension.     Palpations: Abdomen is soft.     Tenderness: There is no abdominal tenderness. There is no guarding.  Musculoskeletal:     Cervical back: Normal range of motion.  Skin:    General: Skin is warm and dry.  Neurological:     Mental Status: He is alert and oriented to person, place, and time.     ED Results / Procedures / Treatments   Labs (all labs ordered are listed, but only abnormal results are displayed) Labs Reviewed  BASIC METABOLIC PANEL - Abnormal; Notable for the following components:      Result Value   Sodium 130 (*)    Chloride 95 (*)    Glucose, Bld 123 (*)    All other components within normal limits  CBC - Abnormal; Notable for the following components:   Platelets 501 (*)    All other components within normal limits  SARS CORONAVIRUS 2 BY RT PCR (HOSPITAL ORDER, [facility])  HEMOGLOBIN A1C  HIV ANTIBODY (ROUTINE TESTING W REFLEX)  TSH  RAPID URINE DRUG SCREEN, HOSP PERFORMED  MAGNESIUM  PHOSPHORUS  TROPONIN I (HIGH SENSITIVITY)  TROPONIN I (HIGH SENSITIVITY)  TROPONIN I (HIGH SENSITIVITY)  TROPONIN I (HIGH SENSITIVITY)    EKG EKG Interpretation  Date/Time:  Wednesday March 06 2020 02:29:04 EDT Ventricular Rate:  96 PR Interval:  170 QRS Duration: 78 QT Interval:  310 QTC Calculation: 391 R Axis:   70 Text Interpretation: Normal sinus rhythm Normal ECG When compared with ECG of EARLIER SAME DATE No significant change was found Confirmed by Delora Fuel ([id number]) on 03/06/2020 4:11:51 AM   Radiology DG Chest 2 View  Result Date: 03/06/2020 CLINICAL DATA:  Chest pain EXAM: CHEST - 2 VIEW COMPARISON:  July 20, 2019 FINDINGS: The heart size and mediastinal contours are within normal limits. Aortic knob calcifications are seen.  There is hyperinflation of the lung zones. Architectural distortion and scarring is seen within the left lower lung.  Subpleural thickening is again noted at the left lung base. No new airspace consolidation or pleural effusion. No acute osseous abnormality. IMPRESSION: No active cardiopulmonary disease. Findings of COPD with chronic lung changes Electronically Signed   By: Prudencio Pair M.D.   On: 03/06/2020 02:46    Procedures Procedures (including critical care time)  Medications Ordered in ED Medications  aspirin EC tablet 81 mg (81 mg Oral Given 03/06/20 1721)  traMADol (ULTRAM) tablet 50 mg (has no administration in time range)  atorvastatin (LIPITOR) tablet 40 mg (has no administration in time range)  ALPRAZolam (XANAX) tablet 0.25 mg (has no administration in time range)  pantoprazole (PROTONIX) EC tablet 40 mg (has no administration in time range)  albuterol (PROVENTIL) (2.5 MG/3ML) 0.083% nebulizer solution 2.5 mg (has no administration in time range)  mometasone-formoterol (DULERA) 200-5 MCG/ACT inhaler 2 puff (has no administration in time range)  fluticasone (FLONASE) 50 MCG/ACT nasal spray 2 spray (has no administration in time range)  enoxaparin (LOVENOX) injection 40 mg (40 mg Subcutaneous Given 03/06/20 1722)  acetaminophen (TYLENOL) tablet 650 mg (has no administration in time range)  ondansetron (ZOFRAN) injection 4 mg (has no administration in time range)  predniSONE (DELTASONE) tablet 40 mg (has no administration in time range)  LORazepam (ATIVAN) tablet 1-4 mg (has no administration in time range)    Or  LORazepam (ATIVAN) injection 1-4 mg (has no administration in time range)  thiamine tablet 100 mg (100 mg Oral Given 03/06/20 1722)    Or  thiamine (B-1) injection 100 mg ( Intravenous See Alternative 7/00/17 4944)  folic acid (FOLVITE) tablet 1 mg (1 mg Oral Given 03/06/20 1721)  multivitamin with minerals tablet 1 tablet (1 tablet Oral Given 03/06/20 1721)  ipratropium-albuterol (DUONEB) 0.5-2.5 (3) MG/3ML nebulizer solution 3 mL (has no administration in time range)    ED Course  I  have reviewed the triage vital signs and the nursing notes.  Pertinent labs & imaging results that were available during my care of the patient were reviewed by me and considered in my medical decision making (see chart for details).    MDM Rules/Calculators/A&P                          69yo male with history above including COPD, CAD presents with concern for chest pain.  Differential diagnosis for chest pain includes pulmonary embolus, dissection, pneumothorax, pneumonia, ACS, myocarditis, pericarditis.  EKG was done and evaluate by me and showed no acute ST changes and no signs of pericarditis. Chest x-ray was done and evaluated by me and radiology and showed no sign of pneumonia or pneumothorax.  Initial troponin 8, increased to 15 on recheck.  Low suspicion for PE. Denies increased dyspnea or cough from baseline, do not feel is consistent with COPD exacerbation.    History of CAD, pressure like pain resolved with nitro and HEART score 5 with troponin increase on recheck. Will admit for continued chest pain observation.   Final Clinical Impression(s) / ED Diagnoses Final diagnoses:  Chest pain, unspecified type    Rx / DC Orders ED Discharge Orders    None       Gareth Morgan, MD 03/06/20 2115

## 2020-03-06 NOTE — ED Triage Notes (Signed)
Pt presents to ED POV. Pt c/o central CP/epigatsric pain. Pt states that it began around 2300 at rest. Pt states that it radiates to his R wrist. Pain is a 7/10.

## 2020-03-06 NOTE — H&P (Signed)
History and Physical    Zachary Chambers:[id number] DOB: 19-Oct-1950 DOA: 03/06/2020  PCP: Zachary Rossetti, MD Consultants:  [facility] - pulmonology; [place] - cardiology Patient coming from:  Home - lives with [place]; NOKCelesta Chambers, [phone]  Chief Complaint: chest pain  HPI: Zachary Chambers is a 69 y.o. male with medical history significant of CAD s/p stents; HLD; and COPD presenting with chest pain.  He has h/o heart attack "and this was kinda like it but it wasn't."  His heart fluttered real bad.  His SO could see his heart pounding and she could feel it through the chest wall.  Symptoms started this AM from midnight to 2.  He hasn't been feeling well for maybe 2 weeks leading up to it.  Pain was substernal in nature and he took 2 NTG that resolved the pain.  He has no strength and is out of breath at baseline.  No apparent change in cough or sputum.  He is followed by pulmonology and was seen by NP [facility] yesterday via telemedicine.    He refuses COVID vaccination and will not wear a mask because it reportedly makes him hypoxic.  They would like to have home O2.  He was given a Z-pack and prednisone taper and ordered for outpatient PFTs; they would like to have that done in the hospital.   ED Course:  CP, needs obs.  Troponin increased from 8 to 15, HEART score 5.  Central pressure with palpitations.  Televisit yesterday with increased SOB, not obviously COPD exacerbation.   Review of Systems: As per HPI; otherwise review of systems reviewed and negative.   Ambulatory Status:  Ambulates without assistance  COVID Vaccine Status:  None  Past Medical History:  Diagnosis Date  . COPD (chronic obstructive pulmonary disease) (HCC)    not on home O2  . DJD (degenerative joint disease)    diffusely  . GERD (gastroesophageal reflux disease)   . Hyperlipidemia   . MI (myocardial infarction) ([place]) 2006   small MI and stents x 3  Zachary Chambers  . PUD (peptic ulcer disease)    with bleeding  .  Rotator cuff disorder    has been evaluated by Dr Zachary Chambers and Zachary Chambers    Past Surgical History:  Procedure Laterality Date  . COLONOSCOPY    . EYE SURGERY     right eye removed at age 41  . heart stent     x3- 2006    Social History   Socioeconomic History  . Marital status: Single    Spouse name: Not on file  . Number of children: Not on file  . Years of education: Not on file  . Highest education level: Not on file  Occupational History  . Occupation: retired  Tobacco Use  . Smoking status: Former Smoker    Packs/day: 1.00    Years: 40.00    Pack years: 40.00    Types: Cigarettes    Quit date: 1945    Years since quitting: 76.6  . Smokeless tobacco: Never Used  . Tobacco comment: declines patch  Vaping Use  . Vaping Use: Never used  Substance and Sexual Activity  . Alcohol use: Yes    Alcohol/week: 30.0 standard drinks    Types: 30 Cans of beer per week    Comment: h/o heavy use, now 4 beers per day  . Drug use: Not Currently  . Sexual activity: Yes  Other Topics Concern  . Not on file  Social History Narrative  . Not on file   Social Determinants of Health   Financial Resource Strain:   . Difficulty of Paying Living Expenses:   Food Insecurity:   . Worried About Charity fundraiser in the Last Year:   . Arboriculturist in the Last Year:   Transportation Needs:   . Film/video editor (Medical):   Marland Kitchen Lack of Transportation (Non-Medical):   Physical Activity:   . Days of Exercise per Week:   . Minutes of Exercise per Session:   Stress:   . Feeling of Stress :   Social Connections:   . Frequency of Communication with Friends and Family:   . Frequency of Social Gatherings with Friends and Family:   . Attends Religious Services:   . Active Member of Clubs or Organizations:   . Attends Archivist Meetings:   Marland Kitchen Marital Status:   Intimate Partner Violence:   . Fear of Current or Ex-Partner:   . Emotionally Abused:   Marland Kitchen Physically Abused:    . Sexually Abused:     No Known Allergies  Family History  Problem Relation Age of Onset  . Ovarian cancer Mother   . Diabetes Maternal Grandfather   . COPD Father   . Diabetes Paternal Grandfather   . Colon cancer Paternal Grandfather   . Esophageal cancer Neg Hx   . Rectal cancer Neg Hx   . Stomach cancer Neg Hx     Prior to Admission medications   Medication Sig Start Date End Date Taking? Authorizing Provider  tiZANidine (ZANAFLEX) 4 MG tablet TAKE 1 TABLET BY MOUTH EVERY EIGHT HOURS AS NEEDED FOR MUSCLE SPASMS 11/27/19   Zachary Rossetti, MD  albuterol (PROVENTIL) (2.5 MG/3ML) 0.083% nebulizer solution Take 3 mLs (2.5 mg total) by nebulization every 4 (four) hours as needed for wheezing or shortness of breath. 01/17/19   Parrett, Fonnie Mu, NP  albuterol (VENTOLIN HFA) 108 (90 Base) MCG/ACT inhaler INHALE 2 PUFFS INTO THE LUNGS EVERY SIX HOURS AS NEEDED FOR FOR WHEEZING AND SHORTNESS OF BREATH 02/22/20   Byrum, Zachary Fillers, MD  ALPRAZolam Zachary Chambers) 0.25 MG tablet Take 1 tablet (0.25 mg total) by mouth 2 (two) times daily as needed for anxiety. 07/07/19   Zachary Rossetti, MD  aspirin 81 MG tablet Take 81 mg by mouth daily.      [provider]  azithromycin (ZITHROMAX) 250 MG tablet 500mg  (two tablets) today, then 250mg  (1 tablet) for the next 4 days 03/05/20   Zachary Rinne, NP  budesonide-formoterol (SYMBICORT) 160-4.5 MCG/ACT inhaler Inhale 2 puffs into the lungs 2 (two) times daily. 11/01/19   Zachary Rinne, NP  celecoxib (CELEBREX) 100 MG capsule TAKE 1 CAPSULE BY MOUTH 2 TIMES DAILY 10/11/19   Zachary Rossetti, MD  clotrimazole-betamethasone (LOTRISONE) cream APPLY TO SKIN 2 TIMES DAILY 01/27/19   Zachary Rossetti, MD  doxycycline (VIBRA-TABS) 100 MG tablet Take 1 tablet (100 mg total) by mouth 2 (two) times daily. 10/31/19   Zachary Rinne, NP  fluticasone (FLONASE) 50 MCG/ACT nasal spray USE 2 SPRAYS IN [facility] NOSTRIL EVERY DAY 03/02/19   Zachary City, Modena Nunnery, MD  Glucosamine HCl  (GLUCOSAMINE PO) Take 1,500 mg by mouth daily.    [provider]  hydrOXYzine (ATARAX/VISTARIL) 10 MG tablet TAKE 1 TABLET BY MOUTH 3 TIMES DAILY AS NEEDED 12/22/18   Zachary Rossetti, MD  nitroGLYCERIN (NITROSTAT) 0.4 MG SL tablet dissolve 1 TABLET UNDER THE  TONGUE every 5 minutes AS NEEDED CHEST PAIN 12/05/18   Zachary Rossetti, MD  omeprazole (PRILOSEC) 40 MG capsule TAKE 1 CAPSULE BY MOUTH EVERY DAY 10/06/19   [place]art, Modena Nunnery, MD  predniSONE (DELTASONE) 10 MG tablet 4 tabs for 2 days, then 3 tabs for 2 days, 2 tabs for 2 days, then 1 tab for 2 days, then stop 03/05/20   Zachary Rinne, NP  simvastatin (ZOCOR) 40 MG tablet TAKE 1 TABLET BY MOUTH NIGHTLY AT BEDTIME 02/09/20   [place], Modena Nunnery, MD  tiotropium (SPIRIVA HANDIHALER) 18 MCG inhalation capsule Place 1 capsule (18 mcg total) into inhaler and inhale daily. 02/22/20   Collene Gobble, MD  traMADol (ULTRAM) 50 MG tablet TAKE 1 TABLET BY MOUTH EVERY 6 HOURS AS NEEDED FOR moderate PAIN 07/07/19   Zachary Rossetti, MD    Physical Exam: Vitals:   03/06/20 0837 03/06/20 1018 03/06/20 1150 03/06/20 1406  BP: 117/73 129/75 128/77   Pulse: 72 71 80   Resp: 14 14 17    Temp:   98.1 F (36.7 C)   TempSrc:   Oral   SpO2: 96% 94% 97%   Weight:    58.5 kg  Height:    5\' 8"  (1.727 m)     . General:  Appears calm and comfortable and is NAD; he is chronically ill and quite frail/cachectic in appearance . Eyes:   EOMI, normal lids, iris . ENT:  grossly normal hearing, lips & tongue, mmm . Neck:  no LAD, masses or thyromegaly . Cardiovascular:  RRR, no m/r/g. No LE edema.  Marland Kitchen Respiratory:   CTA bilaterally with no wheezes/rales/rhonchi.  Normal respiratory effort. . Abdomen:  soft, NT, ND, NABS . Skin:  no rash or induration seen on limited exam . Musculoskeletal:  grossly normal tone BUE/BLE, good ROM, no bony abnormality . Psychiatric:  grossly normal mood and affect, speech fluent and appropriate, AOx3 . Neurologic:  CN 2-12 grossly  intact, moves all extremities in coordinated fashion    Radiological Exams on Admission: DG Chest 2 View  Result Date: 03/06/2020 CLINICAL DATA:  Chest pain EXAM: CHEST - 2 VIEW COMPARISON:  July 20, 2019 FINDINGS: The heart size and mediastinal contours are within normal limits. Aortic knob calcifications are seen. There is hyperinflation of the lung zones. Architectural distortion and scarring is seen within the left lower lung. Subpleural thickening is again noted at the left lung base. No new airspace consolidation or pleural effusion. No acute osseous abnormality. IMPRESSION: No active cardiopulmonary disease. Findings of COPD with chronic lung changes Electronically Signed   By: Prudencio Pair M.D.   On: 03/06/2020 02:46    EKG: Independently reviewed.  NSR with rate 98; nonspecific ST changes with no evidence of acute ischemia; NSCSLT   Labs on Admission: I have personally reviewed the available labs and imaging studies at the time of the admission.  Pertinent labs:   Na++ 130 Glucose 123 HS troponin 8 -> 15 -> 9 -> 7 WBC 9.2 Platelets 501 Lipids on 3/22: 137/64/60/73  Assessment/Plan Principal Problem:   Chest pain Active Problems:   TOBACCO ABUSE   COPD (chronic obstructive pulmonary disease) (HCC)   Heavy alcohol use   Dyslipidemia   Chest pain -Patient with substernal chest pressure that came on at rest overnight, resolved with NTG x 2 -2/3 typical symptoms suggestive of atypical cardiac chest pain.  -Has h/o remote stents in the past (2006) -CXR unremarkable.   -Initial cardiac HS troponin  negative.  All 3 repeats also negative. -EKG not indicative of acute ischemia.   -HEART pathway score is 6, indicating that the patient has an elevated risk score and requires further evaluation. -Will plan to place in observation status on telemetry to rule out ACS by overnight observation.  -Repeat EKG in AM -Start ASA 81 mg daily -Risk factor stratification with HgbA1c;  will also check TSH and UDS -Cardiology consultation -NPO after MN for possible stress test   COPD  -Patient with significant cachexia and wasting, likely due to COPD with ongoing smoking -Continue Symbicort (formulary substitution), substitute Duonebs for Spiriva while hospitalization -He was seen via virtual visit yesterday with progressive DOE - possibly related to CAD instead of COPD -No wheezing or apparent respiratory concern on exam -Will hold antibiotics for now but continue steroids Prednisone 40 mg daily with plan to taper -Will do walk of life/ambulatory pulse ox in AM to see if patient qualifies for home O2 -Will defer PFTs to outpatient since patient does not appear to need them acutely at this time  Malnutrition -The patient's Body mass index is 19.61 kg/m..  -The patient has at least 2 indicators for malnutrition (insufficient energy intake, loss of muscle mass, loss of subcutaneous fat, diminished functional status).  -This is likely due to chronic disease  -Will obtain a nutrition consult for further recommendations.  HLD -Change Zocor to Lipitor despite recent LDL of 60  ETOH dependence -Patient has reportedly dramatically decreased his ETOH consumption and is now drinking only 4 beers per day -Will order CIWA protocol -Check UDS  Tobacco dependence -Patient continues to smoke despite COPD -We talked about the importance of cessation as well as the risk of smoking with home O2 -Cessation should be reviewed on an ongoing basis.   -Patch declined  Pain/anxiety -I have reviewed this patient in the [place] Controlled Substances Reporting System.  He is not taking regular controlled substances. -He is not at particularly high risk of opioid misuse, diversion, or overdose.   DNR -I have discussed code status with the patient and his girlfriend and they are in agreement that the patient would not desire resuscitation and would prefer to die a natural death should that  situation arise.      Note: This patient has been tested and is negative for the novel coronavirus COVID-19.   DVT prophylaxis: Lovenox  Code Status:  DNR - confirmed with patient/family Family Communication: Zachary Chambers was present throughout evaluation Disposition Plan:  The patient is from: home  Anticipated d/c is to: home without [facility] services but possibly with home O2  Anticipated d/c date will depend on clinical response to treatment, but possibly as early as tomorrow if he has excellent response to treatment  Patient is currently: acutely ill Consults called: Cardiology;  Nutrition/TOC team Admission status: It is my clinical opinion that referral for OBSERVATION is reasonable and necessary in this patient based on the above information provided. The aforementioned taken together are felt to place the patient at high risk for further clinical deterioration. However it is anticipated that the patient may be medically stable for discharge from the hospital within 24 to 48 hours.       Karmen Bongo MD [facility]   How to contact the [facility] Attending or Consulting provider [place] or covering provider during after hours [place], for this patient?  1. Check the care team in [facility] and look for a) attending/consulting TRH provider listed and b) the [facility] team  listed 2. Log into [email or web address] and use [place]'s universal password to access. If you do not have the password, please contact the hospital operator. 3. Locate the [facility] provider you are looking for under [facility] and page to a number that you can be directly reached. 4. If you still have difficulty reaching the provider, please page the [facility] (Director on Call) for the Hospitalists listed on amion for assistance.   03/06/2020, 4:28 PM

## 2020-03-06 NOTE — ED Notes (Signed)
2 L [place] applied for desat  < 95%

## 2020-03-07 ENCOUNTER — Observation Stay (HOSPITAL_BASED_OUTPATIENT_CLINIC_OR_DEPARTMENT_OTHER): Payer: Medicare HMO

## 2020-03-07 DIAGNOSIS — Z955 Presence of coronary angioplasty implant and graft: Secondary | ICD-10-CM

## 2020-03-07 DIAGNOSIS — R079 Chest pain, unspecified: Secondary | ICD-10-CM | POA: Diagnosis not present

## 2020-03-07 DIAGNOSIS — J449 Chronic obstructive pulmonary disease, unspecified: Secondary | ICD-10-CM | POA: Diagnosis not present

## 2020-03-07 DIAGNOSIS — R072 Precordial pain: Secondary | ICD-10-CM

## 2020-03-07 DIAGNOSIS — E785 Hyperlipidemia, unspecified: Secondary | ICD-10-CM | POA: Diagnosis not present

## 2020-03-07 DIAGNOSIS — F17200 Nicotine dependence, unspecified, uncomplicated: Secondary | ICD-10-CM | POA: Diagnosis not present

## 2020-03-07 DIAGNOSIS — E43 Unspecified severe protein-calorie malnutrition: Secondary | ICD-10-CM | POA: Insufficient documentation

## 2020-03-07 LAB — ECHOCARDIOGRAM COMPLETE
Area-P 1/2: 3.42 cm2
Calc EF: 55.1 %
Height: 68 in
S' Lateral: 3.1 cm
Single Plane A2C EF: 56.8 %
Single Plane A4C EF: 53.1 %
Weight: 2063.51 oz

## 2020-03-07 MED ORDER — ENSURE ENLIVE PO LIQD
237.0000 mL | Freq: Three times a day (TID) | ORAL | Status: DC
  Administered 2020-03-08: 237 mL via ORAL

## 2020-03-07 MED ORDER — LORAZEPAM 2 MG/ML IJ SOLN
0.5000 mg | Freq: Once | INTRAMUSCULAR | Status: AC
  Administered 2020-03-07: 0.5 mg via INTRAVENOUS
  Filled 2020-03-07: qty 1

## 2020-03-07 MED ORDER — TECHNETIUM TC 99M TETROFOSMIN IV KIT
9.8000 | PACK | Freq: Once | INTRAVENOUS | Status: AC | PRN
  Administered 2020-03-07: 9.8 via INTRAVENOUS

## 2020-03-07 NOTE — Progress Notes (Addendum)
PROGRESS NOTE  Zachary Chambers LKG:[id number] DOB: [birth year] DOA: 03/06/2020 PCP: [name], MD  HPI/Recap of past 24 hours: HPI: Zachary Chambers is a 69 y.o. male with medical history significant of CAD s/p stents; HLD; COPD, ongoing tobacco use, presenting with chest pain.  He has h/o heart attack "and this was kinda like it but it wasn't."  His heart fluttered real bad.  His significant other could see his heart pounding and she could feel it through his chest wall.  Symptoms started the morning of his presentation.  He hasn't been feeling well for maybe 2 weeks leading up to it.  Pain was substernal in nature and he took 2 NTG that resolved the pain. He has no strength and is out of breath at baseline.  No apparent change in cough or sputum.  He is followed by pulmonology and was seen by NP Warner Mccreedy the day prior to his presentation via telemedicine.    He refuses COVID vaccination and will not wear a mask because it reportedly makes him hypoxic.  He was given a Z-pack and prednisone taper and ordered for outpatient PFTs.  HEART score 6.  TRH asked to admit.  03/07/20: Seen and examined.  Denies any chest pain at the time of this visit.  Failed his home oxygen evaluation requiring 2 L to maintain O2 saturation greater than 90%.  Reports he last saw a cardiologist 2 years ago, Dr. [name].  Stress test ordered and cardiology consulted.   Assessment/Plan: Principal Problem:   Chest pain Active Problems:   TOBACCO ABUSE   COPD (chronic obstructive pulmonary disease) (HCC)   Heavy alcohol use   Dyslipidemia  Chest pain rule out ACS Presented with substernal chest pressure that came on at rest overnight, resolved with NTG x 2 2/3 typical symptoms suggestive of atypical cardiac chest pain.  Has h/o remote stents in the past (2006) CXR, possible right pleural effusion versus atelectasis, personally reviewed. Initial cardiac HS troponin negative, negative x3. EKG not indicative of acute  ischemia.  HEART pathway score is 6, indicating that the patient has an elevated risk score and requires further evaluation. Stress test ordered and cardiology consulted. No recent 2D echo 2D echo ordered, results are pending. Follow-up 2D echo results. Continue aspirin 81 mg daily Obtain fasting lipid profile in the morning TSH normal  Coronary artery disease status post PCI with stent in 2006 States he has followed with cardiology 2 years ago, Dr. [name] Denies any chest pain at the time of this exam. Continue aspirin and Lipitor  Acute hypoxic respiratory failure with dyspnea Presented with hypoxia improved after 2 L oxygen supplementation Personally reviewed chest x-ray done on admission which showed hyperinflated lungs with possible right pleural effusion versus atelectasis Start incentive spirometer Maintain O2 saturation greater than 88 to 90%  Acute COPD exacerbation, unclear etiology No evidence of pulmonary infiltrates on chest x-ray Continue bronchodilators and prednisone Defer PFTs to outpatient Tobacco cessation counseling at bedside  Severe protein calorie malnutrition -The patient's Body mass index is 19.61 kg/m  -The patient has at least 2 indicators for malnutrition (insufficient energy intake, loss of muscle mass, loss of subcutaneous fat, diminished functional status).  -This is likely due to his COPD and ongoing tobacco use 1ppd -Nutritionist consult for further recommendations.  HLD -Change Zocor to Lipitor despite recent LDL of 60 Fasting lipid panel tomorrow  ETOH dependence with concern for withdrawal -Patient has reportedly dramatically decreased his ETOH consumption and is now  drinking only 4 beers per day -Continue CIWA protocol -TOC consulted to provide resources for alcohol cessation -Continue multivitamin, thiamine and folate.  Tobacco dependence -Patient continues to smoke despite COPD, 1 pack/day -We talked about the importance of  cessation as well as the risks associated with smoking -Cessation should be reviewed on an ongoing basis.   -Patch declined  Chronic pain/situation anxiety -I have reviewed this patient in the [place] Controlled Substances Reporting System.  He is not taking regular controlled substances. -He is not at particularly high risk of opioid misuse, diversion, or overdose.  Ambulatory dysfunction Assessed by PT with recommendation for home health PT OT Continue PT OT with assistance and fall precautions Appreciate TOC assistance with home health services.  DNR -I have discussed code status with the patient and his girlfriend and they are in agreement that the patient would not desire resuscitation and would prefer to die a natural death should that situation arise.  DVT prophylaxis: Lovenox subcu daily Code Status:  DNR - confirmed with patient/family Family Communication:  None at bedside.  Consult: Cardiology.    Status is: Observation    Dispo:  Patient From: Home  Planned Disposition: Home with Health Care Svc  Expected discharge date: 03/08/20  Medically stable for discharge: No.  Ongoing work-up to rule out ACS    Objective: Vitals:   03/07/20 0006 03/07/20 0448 03/07/20 0730 03/07/20 0731  BP: 110/80 119/71    Pulse: 75 64  66  Resp: 18 18  17   Temp: 97.7 F (36.5 C) 98 F (36.7 C)    TempSrc: Oral Oral    SpO2: 98% 95% 96% 96%  Weight:      Height:        Intake/Output Summary (Last 24 hours) at 03/07/2020 1212 Last data filed at 03/07/2020 1610 Gross per 24 hour  Intake --  Output 100 ml  Net -100 ml   Filed Weights   03/06/20 1406  Weight: 58.5 kg    Exam:  . General: 69 y.o. year-old male frail appearance in no acute distress.  Alert and oriented x3. . Cardiovascular: Regular rate and rhythm with no rubs or gallops.  No thyromegaly or JVD noted.   Marland Kitchen Respiratory: Clear to auscultation with no wheezes or rales. Good inspiratory effort. . Abdomen:  Soft nontender nondistended with normal bowel sounds x4 quadrants. . Musculoskeletal: No lower extremity edema. 2/4 pulses in all 4 extremities. Marland Kitchen Psychiatry: Mood is appropriate for condition and setting   Data Reviewed: CBC: Recent Labs  Lab 03/06/20 0224  WBC 9.2  HGB 15.4  HCT 45.8  MCV 93.5  PLT 960*   Basic Metabolic Panel: Recent Labs  Lab 03/06/20 0224 03/06/20 2019  NA 130*  --   K 3.7  --   CL 95*  --   CO2 25  --   GLUCOSE 123*  --   BUN 11  --   CREATININE 0.85  --   CALCIUM 9.4  --   MG  --  1.8  PHOS  --  3.9   GFR: Estimated Creatinine Clearance: 66.9 mL/min (by C-G formula based on SCr of 0.85 mg/dL). Liver Function Tests: No results for input(s): AST, ALT, ALKPHOS, BILITOT, PROT, ALBUMIN in the last 168 hours. No results for input(s): LIPASE, AMYLASE in the last 168 hours. No results for input(s): AMMONIA in the last 168 hours. Coagulation Profile: No results for input(s): INR, PROTIME in the last 168 hours. Cardiac Enzymes: No results for input(s):  CKTOTAL, CKMB, CKMBINDEX, TROPONINI in the last 168 hours. BNP (last 3 results) No results for input(s): PROBNP in the last 8760 hours. HbA1C: Recent Labs    03/06/20 0224  HGBA1C 5.4   CBG: No results for input(s): GLUCAP in the last 168 hours. Lipid Profile: No results for input(s): CHOL, HDL, LDLCALC, TRIG, CHOLHDL, LDLDIRECT in the last 72 hours. Thyroid Function Tests: Recent Labs    03/06/20 2019  TSH 2.191   Anemia Panel: No results for input(s): VITAMINB12, FOLATE, FERRITIN, TIBC, IRON, RETICCTPCT in the last 72 hours. Urine analysis: No results found for: COLORURINE, APPEARANCEUR, LABSPEC, PHURINE, GLUCOSEU, HGBUR, BILIRUBINUR, KETONESUR, PROTEINUR, UROBILINOGEN, NITRITE, LEUKOCYTESUR Sepsis Labs: @LABRCNTIP (procalcitonin:4,lacticidven:4)  ) Recent Results (from the past 240 hour(s))  SARS Coronavirus 2 by RT PCR (hospital order, performed in [facility] hospital lab)  Nasopharyngeal Nasopharyngeal Swab     Status: None   Collection Time: 03/06/20  3:05 PM   Specimen: Nasopharyngeal Swab  Result Value Ref Range Status   SARS Coronavirus 2 NEGATIVE NEGATIVE Final    Comment: (NOTE) SARS-CoV-2 target nucleic acids are NOT DETECTED.  The SARS-CoV-2 RNA is generally detectable in upper and lower respiratory specimens during the acute phase of infection. The lowest concentration of SARS-CoV-2 viral copies this assay can detect is 250 copies / mL. A negative result does not preclude SARS-CoV-2 infection and should not be used as the sole basis for treatment or other patient management decisions.  A negative result may occur with improper specimen collection / handling, submission of specimen other than nasopharyngeal swab, presence of viral mutation(s) within the areas targeted by this assay, and inadequate number of viral copies (<250 copies / mL). A negative result must be combined with clinical observations, patient history, and epidemiological information.  Fact Sheet for Patients:   StrictlyIdeas.no  Fact Sheet for Healthcare Providers: BankingDealers.co.za  This test is not yet approved or  cleared by the [place] FDA and has been authorized for detection and/or diagnosis of SARS-CoV-2 by FDA under an Emergency Use Authorization (EUA).  This EUA will remain in effect (meaning this test can be used) for the duration of the COVID-19 declaration under Section 564(b)(1) of the Act, 21 U.S.C. section 360bbb-3(b)(1), unless the authorization is terminated or revoked sooner.  Performed at [facility], [place] [address], [place], [place] [address]       Studies: No results found.  Scheduled Meds: . aspirin EC  81 mg Oral Daily  . atorvastatin  40 mg Oral QHS  . enoxaparin (LOVENOX) injection  40 mg Subcutaneous Q24H  . fluticasone  2 spray Each Nare Daily  . folic acid  1 mg Oral Daily   . ipratropium-albuterol  3 mL Nebulization TID  . LORazepam  0.5 mg Intravenous Once  . mometasone-formoterol  2 puff Inhalation BID  . multivitamin with minerals  1 tablet Oral Daily  . pantoprazole  40 mg Oral Daily  . predniSONE  40 mg Oral Q breakfast  . thiamine  100 mg Oral Daily   Or  . thiamine  100 mg Intravenous Daily    Continuous Infusions:   LOS: 0 days     Kayleen Memos, MD [facility] Pager [phone]  If 7PM-7AM, please contact night-coverage [email or web address] Password [email or web address] 03/07/2020, 12:12 PM

## 2020-03-07 NOTE — Care Management (Signed)
Consult for home health needs and DME. WIll await PT consult and oxygen saturation ambulation note.   Magdalen Spatz RN

## 2020-03-07 NOTE — Progress Notes (Signed)
  Echocardiogram 2D Echocardiogram has been performed.  Zachary Chambers 03/07/2020, 10:33 AM

## 2020-03-07 NOTE — Consult Note (Signed)
Cardiology Consultation:   Patient ID: Zachary Chambers; [id number]; 10-Dec-1950   Admit date: 03/06/2020 Date of Consult: 03/07/2020  Primary Care Provider: [name], MD Primary Cardiologist: Dr. [name], MD   Patient Profile:   Zachary Chambers is a 69 y.o. male with a hx of CAD s/p PCI to LCx and most recent intervention to the LAD in 08/2001, current tobacco use with COPD follow by pulmonology, AAA by duplex 08/2016, and HLD who is being seen today for the evaluation of chest pain at the request of Dr. Nevada Crane.  History of Present Illness:   Zachary Chambers is a 56yp M with a hx as stated above who presented to [facility] after experiencing an episode of chest pain on the day of presentation which last approximately 15 minutes in duration and ultimately resolved with 2x SL NTG tablets. He denies recurrent episodes and states that he was in his usual state of health prior to hospital arrival. He states that during the episode he had no associated symptoms such as SOB above his baseline, diaphoresis, N/V or pain radiation. He states he is fairly sedentary at baseline secondary to his lung issues. He does not wear home supplemental O2. He continues to smoke 1PPD and drinks alcohol at least everyday. He is currently chest pain free.  On ED arrival, hsT 8>15>>9>>7. EKG with NSR and no acute ST changes. Echocardiogram performed 03/07/20 with normal LV function at 55-60% with no RWMA, small pericardial effusion and no DD. Primary team ordered Myocardial stress test with pending results. CXR with no activecardiopulmonary disease and findings of COPD with chronic lung changes.   He is followed by Dr. [name] for his cardiology care, last seen in follow up 08/26/2018. At that time he had no anginal symptoms. He was noted to be followed by Dr. Lamonte Sakai for COPD with last chest CT with moderate emphysema. Coronary artery calcification noted on CT with atherosclerotic calcification is noted in the wall of the thoracic  aorta.  He was seen by Wyn Quaker, NP on 03/05/20 for telemedicine visit for SOB. He was continued to on home nubs and inhalers. He was started on Z-pack with prednisone taper. Plan was to update PFTS as they had not been performed since 2014.   Past Medical History:  Diagnosis Date  . COPD (chronic obstructive pulmonary disease) (HCC)    not on home O2  . DJD (degenerative joint disease)    diffusely  . GERD (gastroesophageal reflux disease)   . Hyperlipidemia   . MI (myocardial infarction) ([place]) 2006   small MI and stents x 3  Dr. Doreatha Lew  . PUD (peptic ulcer disease)    with bleeding  . Rotator cuff disorder    has been evaluated by Dr Clifton James and Koleen Distance    Past Surgical History:  Procedure Laterality Date  . COLONOSCOPY    . EYE SURGERY     right eye removed at age 28  . heart stent     x3- 2006     Prior to Admission medications   Medication Sig Start Date End Date Taking? Authorizing Provider  albuterol (PROVENTIL) (2.5 MG/3ML) 0.083% nebulizer solution Take 3 mLs (2.5 mg total) by nebulization every 4 (four) hours as needed for wheezing or shortness of breath. 01/17/19  Yes Parrett, Tammy S, NP  albuterol (VENTOLIN HFA) 108 (90 Base) MCG/ACT inhaler INHALE 2 PUFFS INTO THE LUNGS EVERY SIX HOURS AS NEEDED FOR FOR WHEEZING AND SHORTNESS OF BREATH Patient  taking differently: Inhale 2 puffs into the lungs every 6 (six) hours as needed for wheezing or shortness of breath.  02/22/20  Yes Collene Gobble, MD  aspirin 81 MG tablet Take 81 mg by mouth at bedtime.    Yes [provider]  budesonide-formoterol (SYMBICORT) 160-4.5 MCG/ACT inhaler Inhale 2 puffs into the lungs 2 (two) times daily. 11/01/19  Yes Lauraine Rinne, NP  celecoxib (CELEBREX) 100 MG capsule TAKE 1 CAPSULE BY MOUTH 2 TIMES DAILY Patient taking differently: Take 100 mg by mouth 2 (two) times daily.  10/11/19  Yes [place], Modena Nunnery, MD  fluticasone (FLONASE) 50 MCG/ACT nasal spray USE 2 SPRAYS IN EACH  NOSTRIL EVERY DAY Patient taking differently: Place 2 sprays into both nostrils as needed for allergies.  03/02/19  Yes [place], Modena Nunnery, MD  Glucosamine HCl (GLUCOSAMINE PO) Take 1,500 mg by mouth daily.   Yes [provider]  hydrOXYzine (ATARAX/VISTARIL) 10 MG tablet TAKE 1 TABLET BY MOUTH 3 TIMES DAILY AS NEEDED Patient taking differently: Take 10 mg by mouth as needed for anxiety.  12/22/18  Yes [place], Modena Nunnery, MD  nitroGLYCERIN (NITROSTAT) 0.4 MG SL tablet dissolve 1 TABLET UNDER THE TONGUE every 5 minutes AS NEEDED CHEST PAIN Patient taking differently: Place 0.4 mg under the tongue every 5 (five) minutes as needed for chest pain.  12/05/18  Yes [place], Modena Nunnery, MD  omeprazole (PRILOSEC) 40 MG capsule TAKE 1 CAPSULE BY MOUTH EVERY DAY Patient taking differently: Take 40 mg by mouth daily.  10/06/19  Yes [place], Modena Nunnery, MD  simvastatin (ZOCOR) 40 MG tablet TAKE 1 TABLET BY MOUTH NIGHTLY AT BEDTIME Patient taking differently: Take 40 mg by mouth at bedtime.  02/09/20  Yes [place], Modena Nunnery, MD  tiotropium (SPIRIVA HANDIHALER) 18 MCG inhalation capsule Place 1 capsule (18 mcg total) into inhaler and inhale daily. 02/22/20  Yes [place], Rose Fillers, MD  tiZANidine (ZANAFLEX) 4 MG tablet TAKE 1 TABLET BY MOUTH EVERY EIGHT HOURS AS NEEDED FOR MUSCLE SPASMS Patient taking differently: Take 4 mg by mouth as needed for muscle spasms.  11/27/19  Yes [place], Modena Nunnery, MD  traMADol (ULTRAM) 50 MG tablet TAKE 1 TABLET BY MOUTH EVERY 6 HOURS AS NEEDED FOR moderate PAIN Patient taking differently: Take 50 mg by mouth as needed for moderate pain.  07/07/19  Yes [place], Modena Nunnery, MD  ALPRAZolam Duanne Moron) 0.25 MG tablet Take 1 tablet (0.25 mg total) by mouth 2 (two) times daily as needed for anxiety. Patient not taking: Reported on 03/06/2020 07/07/19   [name], MD  azithromycin [facility]) 250 MG tablet 545m (two tablets) today, then 252m(1 tablet) for the next 4 days Patient not taking:  Reported on 03/06/2020 03/05/20   MaLauraine RinneNP  clotrimazole-betamethasone (LOTRISONE) cream APPLY TO SKIN 2 TIMES DAILY Patient not taking: Reported on 03/06/2020 01/27/19   DuAlycia RossettiMD  predniSONE (DELTASONE) 10 MG tablet 4 tabs for 2 days, then 3 tabs for 2 days, 2 tabs for 2 days, then 1 tab for 2 days, then stop Patient not taking: Reported on 03/06/2020 03/05/20   MaLauraine RinneNP    Inpatient Medications: Scheduled Meds: . aspirin EC  81 mg Oral Daily  . atorvastatin  40 mg Oral QHS  . enoxaparin (LOVENOX) injection  40 mg Subcutaneous Q24H  . fluticasone  2 spray Each Nare Daily  . folic acid  1 mg Oral Daily  . ipratropium-albuterol  3 mL Nebulization  TID  . mometasone-formoterol  2 puff Inhalation BID  . multivitamin with minerals  1 tablet Oral Daily  . pantoprazole  40 mg Oral Daily  . predniSONE  40 mg Oral Q breakfast  . thiamine  100 mg Oral Daily   Or  . thiamine  100 mg Intravenous Daily   Continuous Infusions:  PRN Meds: acetaminophen, albuterol, ALPRAZolam, LORazepam **OR** LORazepam, ondansetron (ZOFRAN) IV, traMADol  Allergies:   No Known Allergies  Social History:   Social History   Socioeconomic History  . Marital status: Single    Spouse name: Not on file  . Number of children: Not on file  . Years of education: Not on file  . Highest education level: Not on file  Occupational History  . Occupation: retired  Tobacco Use  . Smoking status: Former Smoker    Packs/day: 1.00    Years: 40.00    Pack years: 40.00    Types: Cigarettes    Quit date: 1945    Years since quitting: 76.6  . Smokeless tobacco: Never Used  . Tobacco comment: declines patch  Vaping Use  . Vaping Use: Never used  Substance and Sexual Activity  . Alcohol use: Yes    Alcohol/week: 30.0 standard drinks    Types: 30 Cans of beer per week    Comment: h/o heavy use, now 4 beers per day  . Drug use: Not Currently  . Sexual activity: Yes  Other Topics Concern  .  Not on file  Social History Narrative  . Not on file   Social Determinants of Health   Financial Resource Strain:   . Difficulty of Paying Living Expenses:   Food Insecurity:   . Worried About Charity fundraiser in the Last Year:   . Arboriculturist in the Last Year:   Transportation Needs:   . Film/video editor (Medical):   Marland Kitchen Lack of Transportation (Non-Medical):   Physical Activity:   . Days of Exercise per Week:   . Minutes of Exercise per Session:   Stress:   . Feeling of Stress :   Social Connections:   . Frequency of Communication with Friends and Family:   . Frequency of Social Gatherings with Friends and Family:   . Attends Religious Services:   . Active Member of Clubs or Organizations:   . Attends Archivist Meetings:   Marland Kitchen Marital Status:   Intimate Partner Violence:   . Fear of Current or Ex-Partner:   . Emotionally Abused:   Marland Kitchen Physically Abused:   . Sexually Abused:     Family History:   Family History  Problem Relation Age of Onset  . Ovarian cancer Mother   . Diabetes Maternal Grandfather   . COPD Father   . Diabetes Paternal Grandfather   . Colon cancer Paternal Grandfather   . Esophageal cancer Neg Hx   . Rectal cancer Neg Hx   . Stomach cancer Neg Hx    Family Status:  Family Status  Relation Name Status  . Mother  Alive  . MGF  Deceased  . Father  Alive  . PGF  Deceased  . Sister  Alive  . Brother  Alive  . MGM  Deceased  . PGM  Deceased  . Son  Alive  . Neg Hx  (Not Specified)    ROS:  Please see the history of present illness.  All other ROS reviewed and negative.     Physical Exam/Data:  Vitals:   03/07/20 0730 03/07/20 0731 03/07/20 1213 03/07/20 1319  BP:   103/75   Pulse:  66 70 74  Resp:  _0 Temp:   98 F (36.7 C)   TempSrc:   Oral   SpO2: 96% 96% 93% 94%  Weight:      Height:        Intake/Output Summary (Last 24 hours) at 03/07/2020 1458 Last data filed at 03/07/2020 0834 Gross per 24 hour    Intake --  Output 100 ml  Net -100 ml   Filed Weights   03/06/20 1406  Weight: 58.5 kg   Body mass index is 19.61 kg/m.   General: Frail, ill appearing, NAD Neck: Negative for carotid bruits. No JVD Lungs: Diminished in bilateral lower lobes. Breathing is unlabored. Cardiovascular: RRR with S1 S2. No murmurs Abdomen: Soft, non-tender, non-distended. No obvious abdominal masses. Extremities: No edema. Radial pulses 2+ bilaterally Neuro: Alert and oriented. No focal deficits. No facial asymmetry. MAE spontaneously. Psych: Responds to questions appropriately with normal affect.    EKG:  The EKG was personally reviewed and demonstrates: 03/07/20 NSR with HR 98bpm and no acute changes    Telemetry:  Telemetry was personally reviewed and demonstrates: 03/07/20 NSR with HR 80's   Relevant CV Studies:  Echocardiogram 03/07/20:  1. Left ventricular ejection fraction, by estimation, is 55 to 60%. The  left ventricle has normal function. The left ventricle has no regional  wall motion abnormalities. Left ventricular diastolic parameters were  normal.  2. Right ventricular systolic function is normal. The right ventricular  size is normal. There is normal pulmonary artery systolic pressure. The  estimated right ventricular systolic pressure is 63.8 mmHg.  3. The mitral valve is normal in structure. Trivial mitral valve  regurgitation.  4. The aortic valve is tricuspid. Aortic valve regurgitation is not  visualized. Mild aortic valve sclerosis is present, with no evidence of  aortic valve stenosis.  5. Small pericardial effusion  6. The inferior vena cava is normal in size with greater than 50%  respiratory variability, suggesting right atrial pressure of 3 mmHg.    LHC 08/2001:  OVERALL IMPRESSION: 1. Successful angioplasty of sequential lesions in the left circumflex    coronary artery in the setting of acute evolving inferolateral myocardial    infarction. 2. Well  preserved left ventricular function. 3. Residual 80% stenosis in the left anterior descending artery with    moderate coronary atherosclerosis in the right coronary artery.  ANGIOGRAPHIC DATA: 1. Left main coronary artery:  Normal. 2. Left anterior descending:  The left anterior descending has irregularities    proximally and gives off a moderate sized second diagonal vessel.  Just    beyond the origin of the diagonal vessel there is a segmental 75% stenosis. 3. Left circumflex:  The left circumflex has the presence of two stents that    are radiopaque and easily located.  There is mild narrowing in between the    stents at the level of the origin of a continuation branch.  The stents    only had mild intimal hyperplasia and are widely patent with excellent    flow. 4. Right coronary artery:  The right coronary artery initially had ostial    spasm.  IV nitroglycerin was given with resolution of most of the spasm,    but with a residual 20-30% ostial narrowing and a 30-50% narrowing before    the crux.  The distal right coronary artery  was widely patent.  LEFT VENTRICULAR ANGIOGRAM:  The left ventricular angiogram was performed in the RAO position.  Overall cardiac size and silhouette are normal.  Left ventricular function is normal, with a global ejection fraction of 65%.  There is no mitral regurgitation, intracardiac calcification, or intracavitary filling defect.  ANGIOPLASTY PROCEDURE:  We used a JL-4 guide and used a Forte guide wire with marker wires.  We elected to use an 18 mm x 3.0 BX Velocity stent.  This was placed primarily and inflated initially to 14 atm and then in followup to 12 atm.  The final angiographic result was excellent with no residual stenosis, no dissection, and preservation of all side branches.  OVERALL IMPRESSION: 1. Persistent patency of the stent to the left circumflex coronary artery. 2. Successful stent placement in the left anterior descending  coronary. 3. Mild to moderate disease in the right coronary artery with ostial spasm    present. 4. Normal left ventricular function.   Carotid doppler US 08/18/2018:  Summary: Right Carotid: Velocities in the right ICA are consistent with a 1-39% stenosis. Heavy plaque in the proximal to mid ICA.  Left Carotid: Velocities in the left ICA are consistent with a 1-39% stenosis. Heavy plaque in the proximal to mid ICA.  Vertebrals: Bilateral vertebral arteries demonstrate antegrade flow.   Laboratory Data:  Chemistry Recent Labs  Lab 03/06/20 0224  NA 130*  K 3.7  CL 95*  CO2 25  GLUCOSE 123*  BUN 11  CREATININE 0.85  CALCIUM 9.4  GFRNONAA >60  GFRAA >60  ANIONGAP 10    Total Protein  Date Value Ref Range Status  11/06/2019 6.9 6.1 - 8.1 g/dL Final   Albumin  Date Value Ref Range Status  05/27/2018 4.4 3.5 - 5.0 g/dL Final   AST  Date Value Ref Range Status  11/06/2019 18 10 - 35 U/L Final   ALT  Date Value Ref Range Status  11/06/2019 8 (L) 9 - 46 U/L Final   Alkaline Phosphatase  Date Value Ref Range Status  05/27/2018 71 38 - 126 U/L Final   Total Bilirubin  Date Value Ref Range Status  11/06/2019 0.6 0.2 - 1.2 mg/dL Final   Hematology Recent Labs  Lab 03/06/20 0224  WBC 9.2  RBC 4.90  HGB 15.4  HCT 45.8  MCV 93.5  MCH 31.4  MCHC 33.6  RDW 13.4  PLT 501*   Cardiac EnzymesNo results for input(s): TROPONINI in the last 168 hours. No results for input(s): TROPIPOC in the last 168 hours.  BNPNo results for input(s): BNP, PROBNP in the last 168 hours.  DDimer No results for input(s): DDIMER in the last 168 hours. TSH:  Lab Results  Component Value Date   TSH 2.191 03/06/2020   Lipids: Lab Results  Component Value Date   CHOL 137 11/06/2019   HDL 64 11/06/2019   LDLCALC 60 11/06/2019   TRIG 51 11/06/2019   CHOLHDL 2.1 11/06/2019   HgbA1c: Lab Results  Component Value Date   HGBA1C 5.4 03/06/2020    Radiology/Studies:  DG Chest  2 View  Result Date: 03/06/2020 CLINICAL DATA:  Chest pain EXAM: CHEST - 2 VIEW COMPARISON:  July 20, 2019 FINDINGS: The heart size and mediastinal contours are within normal limits. Aortic knob calcifications are seen. There is hyperinflation of the lung zones. Architectural distortion and scarring is seen within the left lower lung. Subpleural thickening is again noted at the left lung base. No new airspace consolidation or  pleural effusion. No acute osseous abnormality. IMPRESSION: No active cardiopulmonary disease. Findings of COPD with chronic lung changes Electronically Signed   By: Prudencio Pair M.D.   On: 03/06/2020 02:46   ECHOCARDIOGRAM COMPLETE  Result Date: 03/07/2020    ECHOCARDIOGRAM REPORT   Patient Name:   ESAW KNIPPEL Date of Exam: 03/07/2020 Medical Rec #:  [id number]   Height:       68.0 in Accession #:    [id number]  Weight:       129.0 lb Date of Birth:  [birth year]  BSA:          1.696 m Patient Age:    68 years    BP:           119/71 mmHg Patient Gender: M           HR:           72 bpm. Exam Location:  Inpatient Procedure: 2D Echo, Cardiac Doppler and Color Doppler Indications:    Chest Pain 786.50 / R07.9  History:        Patient has no prior history of Echocardiogram examinations.                 Previous Myocardial Infarction, COPD, Signs/Symptoms:Chest Pain;                 Risk Factors:Former Smoker and Dyslipidemia. GERD.  Sonographer:    Vickie Epley RDCS Referring Phys: [id number] [place]  Sonographer Comments: Image acquisition challenging due to patient body habitus. IMPRESSIONS  1. Left ventricular ejection fraction, by estimation, is 55 to 60%. The left ventricle has normal function. The left ventricle has no regional wall motion abnormalities. Left ventricular diastolic parameters were normal.  2. Right ventricular systolic function is normal. The right ventricular size is normal. There is normal pulmonary artery systolic pressure. The estimated right ventricular systolic  pressure is 83.6 mmHg.  3. The mitral valve is normal in structure. Trivial mitral valve regurgitation.  4. The aortic valve is tricuspid. Aortic valve regurgitation is not visualized. Mild aortic valve sclerosis is present, with no evidence of aortic valve stenosis.  5. Small pericardial effusion  6. The inferior vena cava is normal in size with greater than 50% respiratory variability, suggesting right atrial pressure of 3 mmHg. FINDINGS  Left Ventricle: Left ventricular ejection fraction, by estimation, is 55 to 60%. The left ventricle has normal function. The left ventricle has no regional wall motion abnormalities. The left ventricular internal cavity size was normal in size. There is  no left ventricular hypertrophy. Left ventricular diastolic parameters were normal. Right Ventricle: The right ventricular size is normal. Right vetricular wall thickness was not assessed. Right ventricular systolic function is normal. There is normal pulmonary artery systolic pressure. The tricuspid regurgitant velocity is 1.96 m/s, and with an assumed right atrial pressure of 3 mmHg, the estimated right ventricular systolic pressure is 62.9 mmHg. Left Atrium: Left atrial size was normal in size. Right Atrium: Right atrial size was normal in size. Pericardium: A small pericardial effusion is present. Mitral Valve: The mitral valve is normal in structure. Trivial mitral valve regurgitation. Tricuspid Valve: The tricuspid valve is normal in structure. Tricuspid valve regurgitation is trivial. Aortic Valve: The aortic valve is tricuspid. Aortic valve regurgitation is not visualized. Mild aortic valve sclerosis is present, with no evidence of aortic valve stenosis. Pulmonic Valve: The pulmonic valve was not well visualized. Pulmonic valve regurgitation is not visualized. Aorta: The aortic root is  normal in size and structure. Venous: The inferior vena cava is normal in size with greater than 50% respiratory variability, suggesting  right atrial pressure of 3 mmHg. IAS/Shunts: No atrial level shunt detected by color flow Doppler.  LEFT VENTRICLE PLAX 2D LVIDd:         4.00 cm     Diastology LVIDs:         3.10 cm     LV e' lateral:   8.41 cm/s LV PW:         0.80 cm     LV E/e' lateral: 8.7 LV IVS:        0.80 cm     LV e' medial:    10.30 cm/s LVOT diam:     1.90 cm     LV E/e' medial:  7.1 LV SV:         28 LV SV Index:   16 LVOT Area:     2.84 cm  LV Volumes (MOD) LV vol d, MOD A2C: 66.2 ml LV vol d, MOD A4C: 73.3 ml LV vol s, MOD A2C: 28.6 ml LV vol s, MOD A4C: 34.4 ml LV SV MOD A2C:     37.6 ml LV SV MOD A4C:     73.3 ml LV SV MOD BP:      38.4 ml RIGHT VENTRICLE RV S prime:     9.99 cm/s TAPSE (M-mode): 1.6 cm LEFT ATRIUM             Index       RIGHT ATRIUM           Index LA diam:        2.90 cm 1.71 cm/m  RA Area:     10.30 cm LA Vol (A2C):   30.4 ml 17.92 ml/m RA Volume:   17.90 ml  10.55 ml/m LA Vol (A4C):   21.9 ml 12.91 ml/m LA Biplane Vol: 27.2 ml 16.04 ml/m  AORTIC VALVE LVOT Vmax:   59.40 cm/s LVOT Vmean:  43.600 cm/s LVOT VTI:    0.098 m  AORTA Ao Root diam: 3.30 cm MITRAL VALVE               TRICUSPID VALVE MV Area (PHT): 3.42 cm    TR Peak grad:   15.4 mmHg MV Decel Time: 222 msec    TR Vmax:        196.00 cm/s MV E velocity: 73.30 cm/s MV A velocity: 69.40 cm/s  SHUNTS MV E/A ratio:  1.06        Systemic VTI:  0.10 m                            Systemic Diam: 1.90 cm Oswaldo Milian MD Electronically signed by Oswaldo Milian MD Signature Date/Time: 03/07/2020/2:17:13 PM    Final     Assessment and Plan:   1. Chest pain with know CAD s/p LAD/LCx PCI>>last cath 2003: -Pt presented to [facility] after experiencing an episode of chest pain on the day of presentation which last approximately 15 minutes in duration and ultimately resolved with 2x SL NTG tablets. He denies recurrent episodes and states that he was in his usual state of health prior to hospital arrival. He states that during the episode he had no  associated symptoms such as SOB above his baseline, diaphoresis, N/V or pain radiation.  -hsT 8>15>>9>>7. EKG with NSR and no acute ST changes.  -Echocardiogram performed 03/07/20 with  normal LV function at 55-60% with no RWMA, small pericardial effusion and no DD.  -Primary team ordered Myocardial stress test with pending results.  -CXR with no activecardiopulmonary disease and findings of COPD with chronic lung changes.  -Given negative hsT, essentially normal echocardiogram with no RWMA and a brief isolated chest pain episode. Would opt to follow stress test results and optimize his medications by adding low dose long acting nitrated or low dose amlodipine as BP tolerates. If symptoms occur more frequently, consider further ischemic workup at that time. To discuss with MD  -Continue ASA, statin  -No BB secondary to low BP   2. Current tobacco use with known COPD: -Follows with pulmonary medicine, last seen 03/05/20 for telemedicine visit for chronic SOB. He was continued to on home nubs and inhalers. He was started on Z-pack with prednisone taper.  -Plan was to update PFTS as they had not been performed since 2014.  -Cessation strongly encouraged  4. HLD: -Stable, last LDL noted to be 36>>>labs from 03/02/2017 -Continue Zocor   5. Carotid artery disease: -Doppler US from 08/2018 with 5-92% in bilateral R/LICA>>>heavy plaque noted  -LDL at goal on las lab work -Repeat Lipid panel with LFTs -Continue Zocor     For questions or updates, please contact [place] Please consult [email or web address] for contact info under Cardiology/STEMI.   SignedKathyrn Drown NP-C [facility] Pager: [phone] 03/07/2020 2:58 PM

## 2020-03-07 NOTE — Social Work (Signed)
CSW acknowledging consult for substance use. Appears pt actively cutting back on use, no need for counseling at this time, if pt asks for cessation resources will provide. Remain available as needed.  Westley Hummer, MSW, [place] Work

## 2020-03-07 NOTE — TOC Initial Note (Addendum)
Transition of Care [facility]) - Initial/Assessment Note    Patient Details  Name: Zachary Chambers MRN: [id number] Date of Birth: Sep 25, 1950  Transition of Care [facility]) CM/SW Contact:    Marilu Favre, RN Phone Number: 03/07/2020, 5:08 PM  Clinical Narrative:                  Patient from home with girl friend.   Discussed home oxygen. Ordered with [employer]. Explained on day of discharge [employer] will bring portable oxygen tank to room and schedule delivery of home oxygen DME.   Provided medicare.gov list for HHPT/OT. Patient has no preference. Left Tommi Rumps with [facility] a message. Will follow up in morning.   03/08/20 0830 Tommi Rumps with [facility] has accepted home health referral. Expected Discharge Plan: [place] Barriers to Discharge: Continued Medical Work up   Patient Goals and CMS Choice Patient states their goals for this hospitalization and ongoing recovery are:: to return to home CMS Medicare.gov Compare Post Acute Care list provided to:: Patient Choice offered to / list presented to : Patient  Expected Discharge Plan and Services Expected Discharge Plan: [place]   Discharge Planning Services: CM Consult Post Acute Care Choice: Home Health, Durable Medical Equipment Living arrangements for the past 2 months: Single Family Home                 DME Arranged: Oxygen DME Agency: [employer] Date DME Agency Contacted: 03/07/20 Time DME Agency Contacted: 2 Representative spoke with at DME Agency: [place] Arranged: PT, OT [place] Agency: [place] Date [place]: 03/07/20 Time [place]: 1708 Representative spoke with at [place]: [place] a message will follow up tomorrow  Prior Living Arrangements/Services Living arrangements for the past 2 months: [place] with:: Significant Other Patient language and need for interpreter reviewed:: Yes Do you feel safe going back to the place where you  live?: Yes      Need for Family Participation in Patient Care: Yes (Comment) Care giver support system in place?: Yes (comment)   Criminal Activity/Legal Involvement Pertinent to Current Situation/Hospitalization: No - Comment as needed  Activities of Daily Living      Permission Sought/Granted   Permission granted to share information with : No              Emotional Assessment Appearance:: Appears stated age Attitude/Demeanor/Rapport: Engaged Affect (typically observed): Accepting Orientation: : Oriented to Self, Oriented to Place, Oriented to Situation, Oriented to  Time Alcohol / Substance Use: Not Applicable Psych Involvement: No (comment)  Admission diagnosis:  Chest pain [R07.9] Patient Active Problem List   Diagnosis Date Noted  . Chest pain 03/06/2020  . Dyslipidemia 03/06/2020  . Healthcare maintenance 03/05/2020  . Heavy alcohol use 03/04/2018  . Memory changes 03/04/2018  . Leukopenia 05/04/2017  . Bruit 01/22/2014  . Muscle spasm of back 09/20/2013  . COPD (chronic obstructive pulmonary disease) ([place]) 01/14/2011  . CAD (coronary artery disease) 01/14/2011  . GERD (gastroesophageal reflux disease) 01/14/2011  . TOBACCO ABUSE 07/25/2009  . Osteoarthritis 07/25/2009  . ROTATOR CUFF SYNDROME, RIGHT 07/25/2009   PCP:  [name], MD Pharmacy:   [place], [place] - [address] [place] [address] Phone: [phone] Fax: [phone]  [facility] - [place], [place] [address] Phone: [phone] Fax: [phone]  Social Determinants of Health (SDOH) Interventions    Readmission Risk Interventions No flowsheet data found.

## 2020-03-07 NOTE — Progress Notes (Signed)
Initial Nutrition Assessment  DOCUMENTATION CODES:   Severe malnutrition in context of chronic illness  INTERVENTION:  Once diet advances, provide Ensure Enlive po TID, each supplement provides 350 kcal and 20 grams of protein  NUTRITION DIAGNOSIS:   Severe Malnutrition related to chronic illness (COPD) as evidenced by severe fat depletion, severe muscle depletion.  GOAL:   Patient will meet greater than or equal to 90% of their needs  MONITOR:   PO intake, Supplement acceptance, Skin, Weight trends, Labs, I & O's  REASON FOR ASSESSMENT:   Consult Assessment of nutrition requirement/status  ASSESSMENT:   69 y.o. male with medical history significant of CAD s/p stents; HLD; COPD, ongoing tobacco use, presenting with chest pain.  Pt is currently NPO for procedure today. Pt reports having a good appetite at home with usual consumption of at least 3 meals a day with no difficulties. Pt with no weight loss per weight records. RD to order nutritional supplements to aid in caloric and protein needs. RN to provide once diet advances.   NUTRITION - FOCUSED PHYSICAL EXAM:    Most Recent Value  Orbital Region Unable to assess  Upper Arm Region Unable to assess  Thoracic and Lumbar Region Severe depletion  Buccal Region Unable to assess  Temple Region Unable to assess  Clavicle Bone Region Severe depletion  Clavicle and Acromion Bone Region Severe depletion  Scapular Bone Region Unable to assess  Dorsal Hand Unable to assess  Patellar Region Moderate depletion  Anterior Thigh Region Moderate depletion  Posterior Calf Region Moderate depletion  Edema (RD Assessment) None  Hair Reviewed  Eyes Reviewed  Mouth Reviewed  Skin Reviewed  Nails Reviewed     Labs and medications reviewed.   Diet Order:   Diet Order            Diet Heart Room service appropriate? Yes; Fluid consistency: Thin  Diet effective now                 EDUCATION NEEDS:   Not appropriate for  education at this time  Skin:  Skin Assessment: Reviewed RN Assessment  Last BM:  Unknown  Height:   Ht Readings from Last 1 Encounters:  03/06/20 5\' 8"  (1.727 m)    Weight:   Wt Readings from Last 1 Encounters:  03/06/20 58.5 kg    BMI:  Body mass index is 19.61 kg/m.  Estimated Nutritional Needs:   Kcal:  1517-6160  Protein:  90-100 grams  Fluid:  >/= 1.9 L/day   Corrin Parker, MS, RD, LDN RD pager number/after hours weekend pager number on Amion.

## 2020-03-07 NOTE — Evaluation (Signed)
Physical Therapy Evaluation Patient Details Name: Zachary Chambers MRN: [id number] DOB: 01-17-1951 Today's Date: 03/07/2020   History of Present Illness  Pt is a 69 y/o male admitted secondary to atypical chest pain. Initial troponins and repeat troponins all negative. CXR unremarkable. Cardiology work-up pending. PMH including but not limited to COPD, ongoing smoker and hx of MI.    Clinical Impression  Pt presented supine in bed with HOB elevated, awake and willing to participate in therapy session. Prior to admission, pt reported that he was independent with all functional mobility and ADLs. Pt lives with his fiance in a single level home with a ramped entrance. At the time of evaluation, pt able to perform transfers with supervision and ambulated in hallway with min guard for safety without use of an AD. Pt with no LOB or chest pain throughout. However, he did report feeling SOB at end of ambulation. Of note, pt initially on 2L of supplemental O2 via [place] with SpO2 at 94% at rest. Pt reporting he is not on O2 at home. Ambulated on RA with SpO2 decreasing to as low as 87% with activity. Reapplied supplemental O2 at end of session. Pt would continue to benefit from skilled physical therapy services at this time while admitted and after d/c to address the below listed limitations in order to improve overall safety and independence with functional mobility.     Follow Up Recommendations Home health PT    Equipment Recommendations  None recommended by PT    Recommendations for Other Services       Precautions / Restrictions Precautions Precautions: Fall Precaution Comments: monitor SpO2 Restrictions Weight Bearing Restrictions: No      Mobility  Bed Mobility Overal bed mobility: Modified Independent                Transfers Overall transfer level: Needs assistance Equipment used: None Transfers: Sit to/from Stand Sit to Stand: Supervision         General transfer comment: no  instability noted, supervision for safety with initial stand  Ambulation/Gait Ambulation/Gait assistance: Min guard Gait Distance (Feet): 100 Feet Assistive device: None Gait Pattern/deviations: Step-through pattern;Decreased stride length Gait velocity: decreased   General Gait Details: pt with mild instability noted but no overt LOB or need for physical assistance, min guard for safety  Stairs            Wheelchair Mobility    Modified Rankin (Stroke Patients Only)       Balance Overall balance assessment: Needs assistance Sitting-balance support: Feet supported Sitting balance-Leahy Scale: Good     Standing balance support: During functional activity Standing balance-Leahy Scale: Fair                               Pertinent Vitals/Pain Pain Assessment: No/denies pain    Home Living Family/patient expects to be discharged to:: Private residence Living Arrangements: Spouse/significant other Available Help at Discharge: Family Type of Home: House Home Access: Ramped entrance     Home Layout: One level Home Equipment: Shower seat      Prior Function Level of Independence: Independent               Hand Dominance        Extremity/Trunk Assessment   Upper Extremity Assessment Upper Extremity Assessment: Overall WFL for tasks assessed    Lower Extremity Assessment Lower Extremity Assessment: Overall WFL for tasks assessed  Communication   Communication: No difficulties  Cognition Arousal/Alertness: Awake/alert Behavior During Therapy: WFL for tasks assessed/performed Overall Cognitive Status: Within Functional Limits for tasks assessed                                        General Comments      Exercises     Assessment/Plan    PT Assessment Patient needs continued PT services  PT Problem List Decreased activity tolerance;Decreased balance;Decreased mobility;Decreased coordination;Decreased  knowledge of use of DME;Decreased safety awareness;Decreased knowledge of precautions;Cardiopulmonary status limiting activity       PT Treatment Interventions DME instruction;Gait training;Stair training;Functional mobility training;Therapeutic activities;Balance training;Therapeutic exercise;Neuromuscular re-education;Patient/family education    PT Goals (Current goals can be found in the Care Plan section)  Acute Rehab PT Goals Patient Stated Goal: "to go home" PT Goal Formulation: With patient Time For Goal Achievement: 03/21/20 Potential to Achieve Goals: Good    Frequency Min 3X/week   Barriers to discharge        Co-evaluation               AM-PAC PT "6 Clicks" Mobility  Outcome Measure Help needed turning from your back to your side while in a flat bed without using bedrails?: None Help needed moving from lying on your back to sitting on the side of a flat bed without using bedrails?: None Help needed moving to and from a bed to a chair (including a wheelchair)?: None Help needed standing up from a chair using your arms (e.g., wheelchair or bedside chair)?: None Help needed to walk in hospital room?: A Little Help needed climbing 3-5 steps with a railing? : A Little 6 Click Score: 22    End of Session Equipment Utilized During Treatment: Gait belt;Oxygen Activity Tolerance: Patient tolerated treatment well Patient left: in bed;with call bell/phone within reach;with bed alarm set Nurse Communication: Mobility status PT Visit Diagnosis: Other abnormalities of gait and mobility (R26.89)    Time: 6681-5947 PT Time Calculation (min) (ACUTE ONLY): 16 min   Charges:   PT Evaluation $PT Eval Moderate Complexity: 1 Mod          Eduard Clos, PT, DPT  Acute Rehabilitation Services Pager [phone] Office [place] 03/07/2020, 10:08 AM

## 2020-03-07 NOTE — Progress Notes (Signed)
SATURATION QUALIFICATIONS: (This note is used to comply with regulatory documentation for home oxygen)  Patient Saturations on Room Air at Rest = 94%  Patient Saturations on Room Air while Ambulating = 87%  Patient Saturations on 2 Liters of oxygen while Ambulating = 93%  Please briefly explain why patient needs home oxygen: Pt desaturates while ambulating on RA.  Anastasio Champion, DPT  Acute Rehabilitation Services Pager [phone] Office [phone]

## 2020-03-08 DIAGNOSIS — R072 Precordial pain: Secondary | ICD-10-CM | POA: Diagnosis not present

## 2020-03-08 DIAGNOSIS — F17200 Nicotine dependence, unspecified, uncomplicated: Secondary | ICD-10-CM | POA: Diagnosis not present

## 2020-03-08 DIAGNOSIS — J449 Chronic obstructive pulmonary disease, unspecified: Secondary | ICD-10-CM | POA: Diagnosis not present

## 2020-03-08 DIAGNOSIS — E785 Hyperlipidemia, unspecified: Secondary | ICD-10-CM | POA: Diagnosis not present

## 2020-03-08 DIAGNOSIS — Z955 Presence of coronary angioplasty implant and graft: Secondary | ICD-10-CM | POA: Diagnosis not present

## 2020-03-08 LAB — NM MYOCAR MULTI W/SPECT W/WALL MOTION / EF
Estimated workload: 1 METS
Exercise duration (min): 0 min
Exercise duration (sec): 0 s
MPHR: 150 {beats}/min
Peak HR: 97 {beats}/min
Percent HR: 64 %
Rest HR: 75 {beats}/min

## 2020-03-08 LAB — CBC
HCT: 42.2 % (ref 39.0–52.0)
Hemoglobin: 14.2 g/dL (ref 13.0–17.0)
MCH: 31.8 pg (ref 26.0–34.0)
MCHC: 33.6 g/dL (ref 30.0–36.0)
MCV: 94.4 fL (ref 80.0–100.0)
Platelets: 452 10*3/uL — ABNORMAL HIGH (ref 150–400)
RBC: 4.47 MIL/uL (ref 4.22–5.81)
RDW: 13.6 % (ref 11.5–15.5)
WBC: 5.7 10*3/uL (ref 4.0–10.5)
nRBC: 0 % (ref 0.0–0.2)

## 2020-03-08 LAB — BASIC METABOLIC PANEL
Anion gap: 9 (ref 5–15)
BUN: 12 mg/dL (ref 8–23)
CO2: 25 mmol/L (ref 22–32)
Calcium: 9 mg/dL (ref 8.9–10.3)
Chloride: 98 mmol/L (ref 98–111)
Creatinine, Ser: 0.77 mg/dL (ref 0.61–1.24)
GFR calc Af Amer: >60 mL/min (ref >60)
GFR calc non Af Amer: >60 mL/min (ref >60)
Glucose, Bld: 101 mg/dL — ABNORMAL HIGH (ref 70–99)
Potassium: 3.7 mmol/L (ref 3.5–5.1)
Sodium: 132 mmol/L — ABNORMAL LOW (ref 135–145)

## 2020-03-08 LAB — LIPID PANEL
Cholesterol: 118 mg/dL (ref 0–200)
HDL: 49 mg/dL (ref >40)
LDL Cholesterol: 61 mg/dL (ref 0–99)
Total CHOL/HDL Ratio: 2.4 RATIO
Triglycerides: 41 mg/dL (ref <150)
VLDL: 8 mg/dL (ref 0–40)

## 2020-03-08 MED ORDER — THIAMINE HCL 100 MG PO TABS: 100.0000 mg | ORAL_TABLET | Freq: Every day | ORAL | 0 refills | Status: AC

## 2020-03-08 MED ORDER — TECHNETIUM TC 99M TETROFOSMIN IV KIT
31.8000 | PACK | Freq: Once | INTRAVENOUS | Status: AC | PRN
  Administered 2020-03-08: 31.8 via INTRAVENOUS

## 2020-03-08 MED ORDER — REGADENOSON 0.4 MG/5ML IV SOLN
0.4000 mg | Freq: Once | INTRAVENOUS | Status: AC
  Filled 2020-03-08: qty 5

## 2020-03-08 MED ORDER — ATORVASTATIN CALCIUM 40 MG PO TABS: 40.0000 mg | ORAL_TABLET | Freq: Every day | ORAL | 0 refills | Status: DC

## 2020-03-08 MED ORDER — IPRATROPIUM-ALBUTEROL 0.5-2.5 (3) MG/3ML IN SOLN: 3.0000 mL | Freq: Two times a day (BID) | RESPIRATORY_TRACT | Status: DC

## 2020-03-08 MED ORDER — FOLIC ACID 1 MG PO TABS: 1.0000 mg | ORAL_TABLET | Freq: Every day | ORAL | 0 refills | Status: DC

## 2020-03-08 MED ORDER — REGADENOSON 0.4 MG/5ML IV SOLN
INTRAVENOUS | Status: AC
  Administered 2020-03-08: 0.4 mg via INTRAVENOUS
  Filled 2020-03-08: qty 5

## 2020-03-08 MED ORDER — IPRATROPIUM-ALBUTEROL 0.5-2.5 (3) MG/3ML IN SOLN: 3.0000 mL | RESPIRATORY_TRACT | Status: DC | PRN

## 2020-03-08 MED ORDER — ADULT MULTIVITAMIN W/MINERALS CH: 1.0000 | ORAL_TABLET | Freq: Every day | ORAL | 0 refills | Status: DC

## 2020-03-08 MED ORDER — ENSURE ENLIVE PO LIQD: 237.0000 mL | Freq: Three times a day (TID) | ORAL | 0 refills | Status: AC

## 2020-03-08 NOTE — Discharge Instructions (Signed)

## 2020-03-08 NOTE — Progress Notes (Signed)
Progress Note  Patient Name: Zachary Chambers Date of Encounter: 03/08/2020  [facility] Cardiologist: Dr [name]  Subjective   No chest pain this am  Inpatient Medications    Scheduled Meds: . aspirin EC  81 mg Oral Daily  . atorvastatin  40 mg Oral QHS  . enoxaparin (LOVENOX) injection  40 mg Subcutaneous Q24H  . feeding supplement (ENSURE ENLIVE)  237 mL Oral TID BM  . fluticasone  2 spray Each Nare Daily  . folic acid  1 mg Oral Daily  . mometasone-formoterol  2 puff Inhalation BID  . multivitamin with minerals  1 tablet Oral Daily  . pantoprazole  40 mg Oral Daily  . predniSONE  40 mg Oral Q breakfast  . thiamine  100 mg Oral Daily   Or  . thiamine  100 mg Intravenous Daily   Continuous Infusions:  PRN Meds: acetaminophen, albuterol, ALPRAZolam, ipratropium-albuterol, LORazepam **OR** LORazepam, ondansetron (ZOFRAN) IV, traMADol   Vital Signs    Vitals:   03/08/20 0011 03/08/20 0542 03/08/20 0550 03/08/20 0751  BP: 108/75 125/76    Pulse: 81 76    Resp: 18     Temp: 98 F (36.7 C)     TempSrc: Oral     SpO2: 97% 99%  96%  Weight:   53.6 kg   Height:        Intake/Output Summary (Last 24 hours) at 03/08/2020 0844 Last data filed at 03/08/2020 0500 Gross per 24 hour  Intake --  Output 675 ml  Net -675 ml   Last 3 Weights 03/08/2020 03/06/2020 11/06/2019  Weight (lbs) 118 lb 3.2 oz 128 lb 15.5 oz 129 lb  Weight (kg) 53.615 kg 58.5 kg 58.514 kg      Telemetry    NSR - Personally Reviewed  ECG    03/06/2020 NSR-HR 96, no acute changes - Personally Reviewed  Physical Exam   GEN: cachectic,  No acute distress.   Neck: No JVD Cardiac: RRR, no murmurs, rubs, or gallops.  Respiratory: Diminished breath sounds c/w COPD- no wheezing GI: Soft, nontender, non-distended  MS: No edema; No deformity. Neuro:  Nonfocal  Psych: Normal affect   Labs    High Sensitivity Troponin:   Recent Labs  Lab 03/06/20 0224 03/06/20 0434 03/06/20 1336 03/06/20 1455   TROPONINIHS 8 15 9 7       Chemistry Recent Labs  Lab 03/06/20 0224 03/08/20 0636  NA 130* 132*  K 3.7 3.7  CL 95* 98  CO2 25 25  GLUCOSE 123* 101*  BUN 11 12  CREATININE 0.85 0.77  CALCIUM 9.4 9.0  GFRNONAA >60 >60  GFRAA >60 >60  ANIONGAP 10 9     Hematology Recent Labs  Lab 03/06/20 0224 03/08/20 0636  WBC 9.2 5.7  RBC 4.90 4.47  HGB 15.4 14.2  HCT 45.8 42.2  MCV 93.5 94.4  MCH 31.4 31.8  MCHC 33.6 33.6  RDW 13.4 13.6  PLT 501* 452*    BNPNo results for input(s): BNP, PROBNP in the last 168 hours.   DDimer No results for input(s): DDIMER in the last 168 hours.   Radiology    CXR-2V 03/06/2020- CHEST - 2 VIEW  COMPARISON:  July 20, 2019  FINDINGS: The heart size and mediastinal contours are within normal limits. Aortic knob calcifications are seen. There is hyperinflation of the lung zones. Architectural distortion and scarring is seen within the left lower lung. Subpleural thickening is again noted at the left lung base. No  new airspace consolidation or pleural effusion. No acute osseous abnormality.  IMPRESSION: No active cardiopulmonary disease.  Findings of COPD with chronic lung changes    Cardiac Studies   Echo 03/07/2020- IMPRESSIONS    1. Left ventricular ejection fraction, by estimation, is 55 to 60%. The  left ventricle has normal function. The left ventricle has no regional  wall motion abnormalities. Left ventricular diastolic parameters were  normal.  2. Right ventricular systolic function is normal. The right ventricular  size is normal. There is normal pulmonary artery systolic pressure. The  estimated right ventricular systolic pressure is 70.1 mmHg.  3. The mitral valve is normal in structure. Trivial mitral valve  regurgitation.  4. The aortic valve is tricuspid. Aortic valve regurgitation is not  visualized. Mild aortic valve sclerosis is present, with no evidence of  aortic valve stenosis.  5. Small  pericardial effusion  6. The inferior vena cava is normal in size with greater than 50%  respiratory variability, suggesting right atrial pressure of 3 mmHg.   Patient Profile     69 y.o. male with a history of CAD, s/p remote PCI, COPD, ETOH and cognitive decline, admitted 03/06/2020 with chest pain.  MI r/o. Myoview pending (rest done late yesterday-stress to be done today).   Assessment & Plan    Chest pain- MI r/o- Myoview pending  CAD- H/O remote PCI- last in '03.  COPD- Severe emphysema- followed by pulmonary service  HLD- LDL 60 March 2021  ETOH- Family had mentioned noticing some cognitive decline to Dr [name]. Pt is awake, alert and oriented this morning.  Plan- Home later today if Myoview is low risk.  F/U with Dr [name] or APP to be arranged at discharge.        For questions or updates, please contact [place] Please consult [email or web address] for contact info under        Signed, Kerin Ransom, PA-C  03/08/2020, 8:44 AM

## 2020-03-08 NOTE — Progress Notes (Signed)
Myoview low risk -OK for discharge.  I will arrange OP follow up.  I have messaged Dr Nevada Crane.  Kerin Ransom PA-C 03/08/2020 1:33 PM

## 2020-03-08 NOTE — Discharge Summary (Signed)
Discharge Summary  TRAMPUS MCQUERRY IOX:[id number] DOB: November 08, 1950  PCP: [name], MD  Admit date: 03/06/2020 Discharge date: 03/08/2020  Time spent: 35 minutes  Recommendations for Outpatient Follow-up:  1. Follow-up with your cardiologist 2. Follow-up with your PCP 3. Continue PT OT with assistance and fall precautions. 4. Completely abstain from tobacco use 5. Take your medications as prescribed.  Discharge Diagnoses:  Active Hospital Problems   Diagnosis Date Noted  . Chest pain 03/06/2020  . Protein-calorie malnutrition, severe 03/07/2020  . S/P coronary artery stent placement   . Dyslipidemia 03/06/2020  . Heavy alcohol use 03/04/2018  . COPD (chronic obstructive pulmonary disease) ([place]) 01/14/2011  . TOBACCO ABUSE 07/25/2009    Resolved Hospital Problems  No resolved problems to display.    Discharge Condition: Stable  Diet recommendation: Heart healthy diet.  Vitals:   03/08/20 0928 03/08/20 1202  BP: (!) 134/70 107/67  Pulse:  71  Resp:  18  Temp:  97.9 F (36.6 C)  SpO2:  97%    History of present illness:  HPI:Zachary T Houghis a 69 y.o.malewith medical history significant ofCAD s/p stents; HLD; COPD, ongoing tobacco use, presenting with chest pain.He has h/o heart attack "and this was kinda like it but it wasn't." His heart fluttered real bad. His significant other could see his heart pounding and she could feel it through his chest wall. Symptoms started the morning of his presentation. He hasn't been feeling well for maybe 2 weeks leading up to it. Pain was substernal in nature and he took 2 NTG that resolved the pain. He has no strength and is out of breath at baseline. No apparent change in cough or sputum.  He is followed by pulmonology and was seen by NP Warner Mccreedy the day prior to his presentation via telemedicine. He refuses COVID vaccination and will not wear a mask because it reportedly makes him hypoxic. He was given a Z-pack and  prednisone taper and ordered for outpatient PFTs.  HEART score 6. TRH asked to admit.  Failed his home oxygen evaluation requiring 2 L to maintain O2 saturation greater than 90%.  Reports he last saw a cardiologist 2 years ago, Dr. [name].  Stress test completed 03/08/20, low risk score.  Okay to discharge from a cardiology standpoint.  03/08/20: No acute events overnight.  No new complaints.  Eager to go home.   Hospital Course:  Principal Problem:   Chest pain Active Problems:   TOBACCO ABUSE   COPD (chronic obstructive pulmonary disease) ([facility])   Heavy alcohol use   Dyslipidemia   Protein-calorie malnutrition, severe   S/P coronary artery stent placement  Resolved chest pain, ACS ruled out. Presented with substernal chest pressurethat came on at rest overnight, resolved with NTG x 2 2/3 typical symptoms suggestive ofatypical cardiac chest pain. Has h/o remote stents in the past (2006) Chest x-ray no evidence of pneumothorax or active cardiopulmonary disease, findings of COPD with chronic lung changes. HS troponin negative, negative x3. EKG not indicative of acute ischemia.  HEART pathway score is 6, indicating that the patient has an elevated risk score and requires further evaluation. 2D echo 03/07/2020, normal LVEF 55 to 60%, small pericardial effusion. Seen by cardiology. Stress test completed on 03/08/2020, low risk score. Continue aspirin 81 mg daily and Lipitor LDL 61, normal TSH normal  Coronary artery disease status post PCI with stent in 2006 States he has followed with cardiology 2 years ago, Dr. [name] Denies any chest pain at the  time of this exam. Continue aspirin and Lipitor Follow-up with your cardiologist  Acute hypoxic respiratory failure with dyspnea Presented with hypoxia improved after 2 L oxygen supplementation Personally reviewed chest x-ray done on admission which showed hyperinflated lungs with chronic lung changes. Maintain O2 saturation  greater than 88 to 90% Failed home O2 evaluation, requires 2 L oxygen nasal cannula with ambulation, to maintain O2 saturation greater than 88%  Acute COPD exacerbation, unclear etiology No evidence of pulmonary infiltrates on chest x-ray Continue bronchodilators and prednisone Defer PFTs to outpatient follow-up Tobacco cessation counseling done at bedside  Severe protein calorie malnutrition -The patient'sBody mass index is 19.61 kg/m  -The patient has at least 2 indicators for malnutrition (insufficient energy intake, loss of muscle mass, loss of subcutaneous fat, diminished functional status).  -This is likely due to his COPD and ongoing tobacco use 1ppd -Continue oral supplement Follow-up with your PCP  HLD -Stable -Continue Lipitor  ETOH dependence with concern for withdrawal -Patient has reportedly dramatically decreased his ETOH consumption and is now drinking only 4 beers per day No evidence of alcohol withdrawal at the time of this visit -Continue multivitamin, thiamine and folate. Alcohol cessation counseling  Tobacco dependence -Patient continues to smoke despite COPD, 1 pack/day -We talked about the importance of cessation as well as the risks associated with smoking -Cessation should be reviewed on an ongoing basis.  -Patchdeclined Follow-up with your PCP  Chronic pain/situational anxiety -I have reviewed this patient in the [place] Controlled Substances Reporting System.He is not taking regular controlled substances. -He is not at particularly high risk of opioid misuse, diversion, or overdose. Continue Atarax, Tylenol, judiciously, as needed Follow-up with your PCP  Ambulatory dysfunction Assessed by PT with recommendation for home health PT OT Continue PT OT with assistance and fall precautions Appreciate TOC assistance with home health services.   Code Status:DNR- confirmed with patient/family   Consult: Cardiology.     Discharge  Exam: BP 107/67 (BP Location: Left Arm)   Pulse 71   Temp 97.9 F (36.6 C) (Oral)   Resp 18   Ht 5\' 8"  (1.727 m)   Wt 53.6 kg   SpO2 97%   BMI 17.97 kg/m  . General: 69 y.o. year-old male well developed well nourished in no acute distress.  Alert and oriented x3. . Cardiovascular: Regular rate and rhythm with no rubs or gallops.  No thyromegaly or JVD noted.   Marland Kitchen Respiratory: Clear to auscultation with no wheezes or rales. Good inspiratory effort. . Abdomen: Soft nontender nondistended with normal bowel sounds x4 quadrants. . Musculoskeletal: No lower extremity edema. 2/4 pulses in all 4 extremities. Marland Kitchen Psychiatry: Mood is appropriate for condition and setting  Discharge Instructions You were cared for by a hospitalist during your hospital stay. If you have any questions about your discharge medications or the care you received while you were in the hospital after you are discharged, you can call the unit and asked to speak with the hospitalist on call if the hospitalist that took care of you is not available. Once you are discharged, your primary care physician will handle any further medical issues. Please note that NO REFILLS for any discharge medications will be authorized once you are discharged, as it is imperative that you return to your primary care physician (or establish a relationship with a primary care physician if you do not have one) for your aftercare needs so that they can reassess your need for medications and monitor your lab values.  Allergies as of 03/08/2020   No Known Allergies     Medication List    STOP taking these medications   ALPRAZolam 0.25 MG tablet Commonly known as: XANAX   azithromycin 250 MG tablet Commonly known as: ZITHROMAX   celecoxib 100 MG capsule Commonly known as: CELEBREX   clotrimazole-betamethasone cream Commonly known as: LOTRISONE   simvastatin 40 MG tablet Commonly known as: ZOCOR     TAKE these medications   albuterol (2.5  MG/3ML) 0.083% nebulizer solution Commonly known as: PROVENTIL Take 3 mLs (2.5 mg total) by nebulization every 4 (four) hours as needed for wheezing or shortness of breath. What changed: Another medication with the same name was changed. Make sure you understand how and when to take each.   albuterol 108 (90 Base) MCG/ACT inhaler Commonly known as: VENTOLIN HFA INHALE 2 PUFFS INTO THE LUNGS EVERY SIX HOURS AS NEEDED FOR FOR WHEEZING AND SHORTNESS OF BREATH What changed:   how much to take  how to take this  when to take this  reasons to take this  additional instructions   aspirin 81 MG tablet Take 81 mg by mouth at bedtime.   atorvastatin 40 MG tablet Commonly known as: LIPITOR Take 1 tablet (40 mg total) by mouth at bedtime.   budesonide-formoterol 160-4.5 MCG/ACT inhaler Commonly known as: Symbicort Inhale 2 puffs into the lungs 2 (two) times daily.   feeding supplement (ENSURE ENLIVE) Liqd Take 237 mLs by mouth 3 (three) times daily between meals for 7 days.   fluticasone 50 MCG/ACT nasal spray Commonly known as: FLONASE USE 2 SPRAYS IN EACH NOSTRIL EVERY DAY What changed:   when to take this  reasons to take this   folic acid 1 MG tablet Commonly known as: FOLVITE Take 1 tablet (1 mg total) by mouth daily. Start taking on: March 09, 2020   GLUCOSAMINE PO Take 1,500 mg by mouth daily.   hydrOXYzine 10 MG tablet Commonly known as: ATARAX/VISTARIL TAKE 1 TABLET BY MOUTH 3 TIMES DAILY AS NEEDED What changed:   when to take this  reasons to take this   multivitamin with minerals Tabs tablet Take 1 tablet by mouth daily. Start taking on: March 09, 2020   nitroGLYCERIN 0.4 MG SL tablet Commonly known as: Nitrostat dissolve 1 TABLET UNDER THE TONGUE every 5 minutes AS NEEDED CHEST PAIN What changed:   how much to take  how to take this  when to take this  reasons to take this  additional instructions   omeprazole 40 MG capsule Commonly known  as: PRILOSEC TAKE 1 CAPSULE BY MOUTH EVERY DAY What changed: how much to take   predniSONE 10 MG tablet Commonly known as: DELTASONE 4 tabs for 2 days, then 3 tabs for 2 days, 2 tabs for 2 days, then 1 tab for 2 days, then stop   Spiriva HandiHaler 18 MCG inhalation capsule Generic drug: tiotropium Place 1 capsule (18 mcg total) into inhaler and inhale daily.   thiamine 100 MG tablet Take 1 tablet (100 mg total) by mouth daily. Start taking on: March 09, 2020   tiZANidine 4 MG tablet Commonly known as: ZANAFLEX TAKE 1 TABLET BY MOUTH EVERY EIGHT HOURS AS NEEDED FOR MUSCLE SPASMS What changed: See the new instructions.   traMADol 50 MG tablet Commonly known as: ULTRAM TAKE 1 TABLET BY MOUTH EVERY 6 HOURS AS NEEDED FOR moderate PAIN What changed:   how much to take  how to take this  when to  take this  reasons to take this  additional instructions            Durable Medical Equipment  (From admission, onward)         Start     Ordered   03/07/20 1212  For home use only DME oxygen  Once       Question Answer Comment  Length of Need 6 Months   Mode or (Route) Nasal cannula   Liters per Minute 2   Frequency Continuous (stationary and portable oxygen unit needed)   Oxygen conserving device Yes   Oxygen delivery system Gas      03/07/20 1211         No Known Allergies  Follow-up Information    [facility] Follow up.   Specialty: Home Health Services Contact information: [address] [place] [address] [phone]        Josue Hector, MD Follow up.   Specialty: Cardiology Why: Office will contact you for a follow appointment in 3-4 weeks. Contact information: [address] [address] [phone]        [name], MD. Call in 1 day(s).   Specialty: Family Medicine Why: Please call for a post hospital follow up appointment Contact information: [place] [address]  [place] [address] [phone]                The results of significant diagnostics from this hospitalization (including imaging, microbiology, ancillary and laboratory) are listed below for reference.    Significant Diagnostic Studies: DG Chest 2 View  Result Date: 03/06/2020 CLINICAL DATA:  Chest pain EXAM: CHEST - 2 VIEW COMPARISON:  July 20, 2019 FINDINGS: The heart size and mediastinal contours are within normal limits. Aortic knob calcifications are seen. There is hyperinflation of the lung zones. Architectural distortion and scarring is seen within the left lower lung. Subpleural thickening is again noted at the left lung base. No new airspace consolidation or pleural effusion. No acute osseous abnormality. IMPRESSION: No active cardiopulmonary disease. Findings of COPD with chronic lung changes Electronically Signed   By: Prudencio Pair M.D.   On: 03/06/2020 02:46   NM Myocar Multi W/Spect W/Wall Motion / EF  Result Date: 03/08/2020  ST segment depression was noted during stress.  Defect 1: There is a small defect of mild severity present in the apex location.  This is a low risk study.  Nuclear stress EF: 63%.  Small size, mild intensity fixed apical perfusion defect, likely artifact. No significant reversible ischemia. LVEF 63% with normal wall motion. This is a low risk study.   ECHOCARDIOGRAM COMPLETE  Result Date: 03/07/2020    ECHOCARDIOGRAM REPORT   Patient Name:   Zachary Chambers Date of Exam: 03/07/2020 Medical Rec #:  [id number]   Height:       68.0 in Accession #:    [id number]  Weight:       129.0 lb Date of Birth:  September 26, 1950  BSA:          1.696 m Patient Age:    51 years    BP:           119/71 mmHg Patient Gender: M           HR:           72 bpm. Exam Location:  Inpatient Procedure: 2D Echo, Cardiac Doppler and Color Doppler Indications:    Chest Pain  786.50 / R07.9  History:        Patient has no prior history of Echocardiogram examinations.                 Previous  Myocardial Infarction, COPD, Signs/Symptoms:Chest Pain;                 Risk Factors:Former Smoker and Dyslipidemia. GERD.  Sonographer:    Vickie Epley RDCS Referring Phys: [id number] [place]  Sonographer Comments: Image acquisition challenging due to patient body habitus. IMPRESSIONS  1. Left ventricular ejection fraction, by estimation, is 55 to 60%. The left ventricle has normal function. The left ventricle has no regional wall motion abnormalities. Left ventricular diastolic parameters were normal.  2. Right ventricular systolic function is normal. The right ventricular size is normal. There is normal pulmonary artery systolic pressure. The estimated right ventricular systolic pressure is 82.4 mmHg.  3. The mitral valve is normal in structure. Trivial mitral valve regurgitation.  4. The aortic valve is tricuspid. Aortic valve regurgitation is not visualized. Mild aortic valve sclerosis is present, with no evidence of aortic valve stenosis.  5. Small pericardial effusion  6. The inferior vena cava is normal in size with greater than 50% respiratory variability, suggesting right atrial pressure of 3 mmHg. FINDINGS  Left Ventricle: Left ventricular ejection fraction, by estimation, is 55 to 60%. The left ventricle has normal function. The left ventricle has no regional wall motion abnormalities. The left ventricular internal cavity size was normal in size. There is  no left ventricular hypertrophy. Left ventricular diastolic parameters were normal. Right Ventricle: The right ventricular size is normal. Right vetricular wall thickness was not assessed. Right ventricular systolic function is normal. There is normal pulmonary artery systolic pressure. The tricuspid regurgitant velocity is 1.96 m/s, and with an assumed right atrial pressure of 3 mmHg, the estimated right ventricular systolic pressure is 23.5 mmHg. Left Atrium: Left atrial size was normal in size. Right Atrium: Right atrial size was normal in size.  Pericardium: A small pericardial effusion is present. Mitral Valve: The mitral valve is normal in structure. Trivial mitral valve regurgitation. Tricuspid Valve: The tricuspid valve is normal in structure. Tricuspid valve regurgitation is trivial. Aortic Valve: The aortic valve is tricuspid. Aortic valve regurgitation is not visualized. Mild aortic valve sclerosis is present, with no evidence of aortic valve stenosis. Pulmonic Valve: The pulmonic valve was not well visualized. Pulmonic valve regurgitation is not visualized. Aorta: The aortic root is normal in size and structure. Venous: The inferior vena cava is normal in size with greater than 50% respiratory variability, suggesting right atrial pressure of 3 mmHg. IAS/Shunts: No atrial level shunt detected by color flow Doppler.  LEFT VENTRICLE PLAX 2D LVIDd:         4.00 cm     Diastology LVIDs:         3.10 cm     LV e' lateral:   8.41 cm/s LV PW:         0.80 cm     LV E/e' lateral: 8.7 LV IVS:        0.80 cm     LV e' medial:    10.30 cm/s LVOT diam:     1.90 cm     LV E/e' medial:  7.1 LV SV:         28 LV SV Index:   16 LVOT Area:     2.84 cm  LV Volumes (MOD) LV vol d, MOD A2C:  66.2 ml LV vol d, MOD A4C: 73.3 ml LV vol s, MOD A2C: 28.6 ml LV vol s, MOD A4C: 34.4 ml LV SV MOD A2C:     37.6 ml LV SV MOD A4C:     73.3 ml LV SV MOD BP:      38.4 ml RIGHT VENTRICLE RV S prime:     9.99 cm/s TAPSE (M-mode): 1.6 cm LEFT ATRIUM             Index       RIGHT ATRIUM           Index LA diam:        2.90 cm 1.71 cm/m  RA Area:     10.30 cm LA Vol (A2C):   30.4 ml 17.92 ml/m RA Volume:   17.90 ml  10.55 ml/m LA Vol (A4C):   21.9 ml 12.91 ml/m LA Biplane Vol: 27.2 ml 16.04 ml/m  AORTIC VALVE LVOT Vmax:   59.40 cm/s LVOT Vmean:  43.600 cm/s LVOT VTI:    0.098 m  AORTA Ao Root diam: 3.30 cm MITRAL VALVE               TRICUSPID VALVE MV Area (PHT): 3.42 cm    TR Peak grad:   15.4 mmHg MV Decel Time: 222 msec    TR Vmax:        196.00 cm/s MV E velocity: 73.30 cm/s  MV A velocity: 69.40 cm/s  SHUNTS MV E/A ratio:  1.06        Systemic VTI:  0.10 m                            Systemic Diam: 1.90 cm Oswaldo Milian MD Electronically signed by Oswaldo Milian MD Signature Date/Time: 03/07/2020/2:17:13 PM    Final     Microbiology: Recent Results (from the past 240 hour(s))  SARS Coronavirus 2 by RT PCR (hospital order, performed in [facility] hospital lab) Nasopharyngeal Nasopharyngeal Swab     Status: None   Collection Time: 03/06/20  3:05 PM   Specimen: Nasopharyngeal Swab  Result Value Ref Range Status   SARS Coronavirus 2 NEGATIVE NEGATIVE Final    Comment: (NOTE) SARS-CoV-2 target nucleic acids are NOT DETECTED.  The SARS-CoV-2 RNA is generally detectable in upper and lower respiratory specimens during the acute phase of infection. The lowest concentration of SARS-CoV-2 viral copies this assay can detect is 250 copies / mL. A negative result does not preclude SARS-CoV-2 infection and should not be used as the sole basis for treatment or other patient management decisions.  A negative result may occur with improper specimen collection / handling, submission of specimen other than nasopharyngeal swab, presence of viral mutation(s) within the areas targeted by this assay, and inadequate number of viral copies (<250 copies / mL). A negative result must be combined with clinical observations, patient history, and epidemiological information.  Fact Sheet for Patients:   StrictlyIdeas.no  Fact Sheet for Healthcare Providers: BankingDealers.co.za  This test is not yet approved or  cleared by the [place] FDA and has been authorized for detection and/or diagnosis of SARS-CoV-2 by FDA under an Emergency Use Authorization (EUA).  This EUA will remain in effect (meaning this test can be used) for the duration of the COVID-19 declaration under Section 564(b)(1) of the Act, 21 U.S.C. section  360bbb-3(b)(1), unless the authorization is terminated or revoked sooner.  Performed at [facility], [address] [place], [place] [address]      Labs: Basic Metabolic Panel: Recent Labs  Lab 03/06/20 0224 03/06/20 2019 03/08/20 0636  NA 130*  --  132*  K 3.7  --  3.7  CL 95*  --  98  CO2 25  --  25  GLUCOSE 123*  --  101*  BUN 11  --  12  CREATININE 0.85  --  0.77  CALCIUM 9.4  --  9.0  MG  --  1.8  --   PHOS  --  3.9  --    Liver Function Tests: No results for input(s): AST, ALT, ALKPHOS, BILITOT, PROT, ALBUMIN in the last 168 hours. No results for input(s): LIPASE, AMYLASE in the last 168 hours. No results for input(s): AMMONIA in the last 168 hours. CBC: Recent Labs  Lab 03/06/20 0224 03/08/20 0636  WBC 9.2 5.7  HGB 15.4 14.2  HCT 45.8 42.2  MCV 93.5 94.4  PLT 501* 452*   Cardiac Enzymes: No results for input(s): CKTOTAL, CKMB, CKMBINDEX, TROPONINI in the last 168 hours. BNP: BNP (last 3 results) No results for input(s): BNP in the last 8760 hours.  ProBNP (last 3 results) No results for input(s): PROBNP in the last 8760 hours.  CBG: No results for input(s): GLUCAP in the last 168 hours.     Signed:  Kayleen Memos, MD [facility] 03/08/2020, 1:59 PM

## 2020-03-08 NOTE — Progress Notes (Signed)
Discharge instructions given to the patient and his sister and he states understanding, IV has been removed and pt got himself dressed and waiting for oxygen to be delivered before DC

## 2020-03-08 NOTE — Progress Notes (Signed)
   Patient presented for a nuclear stress test today. No immediate complications. Stress imaging is pending at this time.   Preliminary EKG findings may be listed in the chart, but the stress test result will not be finalized until perfusion imaging is complete.  Darreld Mclean, PA-C 03/08/2020 9:38 AM

## 2020-03-08 NOTE — Progress Notes (Signed)
Zachary Chambers to be D/C'd Home per MD order.  Discussed with the patient and all questions fully answered.   VSS, Skin clean, dry and intact without evidence of skin break down, no evidence of skin tears noted. IV catheter discontinued intact. Site without signs and symptoms of complications. Dressing and pressure applied.   An After Visit Summary was printed and given to the patient.    D/C education completed with patient/family including follow up instructions, medication list, d/c activities limitations if indicated, with other d/c instructions as indicated by MD - patient able to verbalize understanding, all questions fully answered.    Patient instructed to return to ED, call 911, or call MD for any changes in condition.    Patient escorted via [place], and D/C home via private car.

## 2020-03-12 ENCOUNTER — Encounter: Payer: Self-pay | Admitting: Pulmonary Disease

## 2020-03-12 ENCOUNTER — Ambulatory Visit: Payer: Medicare HMO | Admitting: Pulmonary Disease

## 2020-03-12 ENCOUNTER — Other Ambulatory Visit: Payer: Self-pay

## 2020-03-12 VITALS — BP 130/66 | HR 73 | Temp 97.6°F | Ht 68.0 in | Wt 123.2 lb

## 2020-03-12 DIAGNOSIS — F17210 Nicotine dependence, cigarettes, uncomplicated: Secondary | ICD-10-CM | POA: Diagnosis not present

## 2020-03-12 DIAGNOSIS — Z0000 Encounter for general adult medical examination without abnormal findings: Secondary | ICD-10-CM

## 2020-03-12 DIAGNOSIS — J449 Chronic obstructive pulmonary disease, unspecified: Secondary | ICD-10-CM

## 2020-03-12 DIAGNOSIS — F17200 Nicotine dependence, unspecified, uncomplicated: Secondary | ICD-10-CM

## 2020-03-12 MED ORDER — BREZTRI AEROSPHERE 160-9-4.8 MCG/ACT IN AERO
2.0000 | INHALATION_SPRAY | Freq: Two times a day (BID) | RESPIRATORY_TRACT | 0 refills | Status: DC
Start: 2020-03-12 — End: 2020-07-10

## 2020-03-12 MED ORDER — BREZTRI AEROSPHERE 160-9-4.8 MCG/ACT IN AERO
2.0000 | INHALATION_SPRAY | Freq: Two times a day (BID) | RESPIRATORY_TRACT | 6 refills | Status: DC
Start: 2020-03-12 — End: 2021-01-29

## 2020-03-12 NOTE — Assessment & Plan Note (Signed)
Plan: Provided resources and contact information for COVID-19 vaccines Emphasized need to obtain a Covid vaccine Emphasized need to stop smoking

## 2020-03-12 NOTE — Progress Notes (Signed)
@Patient  ID: Zachary Chambers, male    DOB: Sep 26, 1950, 69 y.o.   MRN: [id number]  Chief Complaint  Patient presents with  . Follow-up    Follow-up, recent hospitalization, COPD, still smoking    Referring provider: [name], MD  HPI:  69 year old male current every day smoker followed in our office for COPD  Past medical history: CAD, GERD, alcohol abuse, tobacco abuse Smoking history: Current everyday smoker.  Smoking 1 pack/day.  40-pack-year smoking history Maintenance: Symbicort 160, Spiriva Handihaler Patient of Dr. Lamonte Chambers  03/12/2020  - Visit   69 year old male current everyday smoker followed in our office for COPD.  Patient presenting to office today as a follow-up as well as a hospital follow-up.  He was recently hospitalized in July/2021.  Patient was requesting to be walked in office to be evaluated if he requires oxygen with physical exertion.  Patient was walked on room air.  He did not have oxygen desaturations on room air.  Patient did not qualify for oxygen.  Patient continues to smoke 10 cigarettes a day.  He is declining smoking cessation assistance today.  He reports that he will "quit in his own time".  Patient reporting that he is currently taking Symbicort 160 2 puffs twice daily and Spiriva HandiHaler 1 puff twice daily.  We will discuss and review this today.  Patient is also taking his rescue inhaler 8 times a day.  Sometimes more.  Patient admits that sometimes he takes his rescue Hailer so often that he does not know how many times he takes it  Patient has not received the Covid vaccine yet.  He reports he has no idea where to get one.  We will discuss and review this today.  Patient is frustrated today as he feels that he should qualify for oxygen.  We will discuss and review this.  Questionaires / Pulmonary Flowsheets:   ACT:  No flowsheet data found.  MMRC: No flowsheet data found.  Epworth:  No flowsheet data found.  Tests:    07/07/2019-CBC with differential-eosinophils relative 3.8, eosinophils absolute 182  07/20/2019-CT chest lung cancer screening-lung RADS 2, continue annual screening, centrilobular and paraseptal emphysema  07/18/2013-pulmonary function test-FVC 3.04 (70% predicted), postbronchodilator ratio 47, postbronchodilator FEV1 1.42 (44% predicted), no bronchodilator response, DLCO 14.22 (47% predicted)   FENO:  No results found for: NITRICOXIDE  PFT: PFT Results Latest Ref Rng & Units 07/18/2013  FVC-Predicted Pre % 70  FVC-Post L 3.05  FVC-Predicted Post % 70  Pre FEV1/FVC % % 46  Post FEV1/FCV % % 47  FEV1-Pre L 1.39  FEV1-Predicted Pre % 43  FEV1-Post L 1.42  DLCO UNC% % 47  DLCO COR %Predicted % 58  TLC L 5.13  TLC % Predicted % 77  RV % Predicted % 98    WALK:  No flowsheet data found.  Imaging: DG Chest 2 View  Result Date: 03/06/2020 CLINICAL DATA:  Chest pain EXAM: CHEST - 2 VIEW COMPARISON:  July 20, 2019 FINDINGS: The heart size and mediastinal contours are within normal limits. Aortic knob calcifications are seen. There is hyperinflation of the lung zones. Architectural distortion and scarring is seen within the left lower lung. Subpleural thickening is again noted at the left lung base. No new airspace consolidation or pleural effusion. No acute osseous abnormality. IMPRESSION: No active cardiopulmonary disease. Findings of COPD with chronic lung changes Electronically Signed   By: Zachary Chambers M.D.   On: 03/06/2020 02:46   NM Myocar  Multi W/Spect W/Wall Motion / EF  Result Date: 03/08/2020  ST segment depression was noted during stress.  Defect 1: There is a small defect of mild severity present in the apex location.  This is a low risk study.  Nuclear stress EF: 63%.  Small size, mild intensity fixed apical perfusion defect, likely artifact. No significant reversible ischemia. LVEF 63% with normal wall motion. This is a low risk study.   ECHOCARDIOGRAM  COMPLETE  Result Date: 03/07/2020    ECHOCARDIOGRAM REPORT   Patient Name:   Zachary Chambers Date of Exam: 03/07/2020 Medical Rec #:  [id number]   Height:       68.0 in Accession #:    [id number]  Weight:       129.0 lb Date of Birth:  1950-09-09  BSA:          1.696 m Patient Age:    37 years    BP:           119/71 mmHg Patient Gender: M           HR:           72 bpm. Exam Location:  Inpatient Procedure: 2D Echo, Cardiac Doppler and Color Doppler Indications:    Chest Pain 786.50 / R07.9  History:        Patient has no prior history of Echocardiogram examinations.                 Previous Myocardial Infarction, COPD, Signs/Symptoms:Chest Pain;                 Risk Factors:Former Smoker and Dyslipidemia. GERD.  Sonographer:    Zachary Chambers RDCS Referring Phys: [id number] [place]  Sonographer Comments: Image acquisition challenging due to patient body habitus. IMPRESSIONS  1. Left ventricular ejection fraction, by estimation, is 55 to 60%. The left ventricle has normal function. The left ventricle has no regional wall motion abnormalities. Left ventricular diastolic parameters were normal.  2. Right ventricular systolic function is normal. The right ventricular size is normal. There is normal pulmonary artery systolic pressure. The estimated right ventricular systolic pressure is 02.4 mmHg.  3. The mitral valve is normal in structure. Trivial mitral valve regurgitation.  4. The aortic valve is tricuspid. Aortic valve regurgitation is not visualized. Mild aortic valve sclerosis is present, with no evidence of aortic valve stenosis.  5. Small pericardial effusion  6. The inferior vena cava is normal in size with greater than 50% respiratory variability, suggesting right atrial pressure of 3 mmHg. FINDINGS  Left Ventricle: Left ventricular ejection fraction, by estimation, is 55 to 60%. The left ventricle has normal function. The left ventricle has no regional wall motion abnormalities. The left ventricular internal  cavity size was normal in size. There is  no left ventricular hypertrophy. Left ventricular diastolic parameters were normal. Right Ventricle: The right ventricular size is normal. Right vetricular wall thickness was not assessed. Right ventricular systolic function is normal. There is normal pulmonary artery systolic pressure. The tricuspid regurgitant velocity is 1.96 m/s, and with an assumed right atrial pressure of 3 mmHg, the estimated right ventricular systolic pressure is 09.7 mmHg. Left Atrium: Left atrial size was normal in size. Right Atrium: Right atrial size was normal in size. Pericardium: A small pericardial effusion is present. Mitral Valve: The mitral valve is normal in structure. Trivial mitral valve regurgitation. Tricuspid Valve: The tricuspid valve is normal in structure. Tricuspid valve regurgitation is trivial. Aortic Valve: The aortic  valve is tricuspid. Aortic valve regurgitation is not visualized. Mild aortic valve sclerosis is present, with no evidence of aortic valve stenosis. Pulmonic Valve: The pulmonic valve was not well visualized. Pulmonic valve regurgitation is not visualized. Aorta: The aortic root is normal in size and structure. Venous: The inferior vena cava is normal in size with greater than 50% respiratory variability, suggesting right atrial pressure of 3 mmHg. IAS/Shunts: No atrial level shunt detected by color flow Doppler.  LEFT VENTRICLE PLAX 2D LVIDd:         4.00 cm     Diastology LVIDs:         3.10 cm     LV e' lateral:   8.41 cm/s LV PW:         0.80 cm     LV E/e' lateral: 8.7 LV IVS:        0.80 cm     LV e' medial:    10.30 cm/s LVOT diam:     1.90 cm     LV E/e' medial:  7.1 LV SV:         28 LV SV Index:   16 LVOT Area:     2.84 cm  LV Volumes (MOD) LV vol d, MOD A2C: 66.2 ml LV vol d, MOD A4C: 73.3 ml LV vol s, MOD A2C: 28.6 ml LV vol s, MOD A4C: 34.4 ml LV SV MOD A2C:     37.6 ml LV SV MOD A4C:     73.3 ml LV SV MOD BP:      38.4 ml RIGHT VENTRICLE RV S  prime:     9.99 cm/s TAPSE (M-mode): 1.6 cm LEFT ATRIUM             Index       RIGHT ATRIUM           Index LA diam:        2.90 cm 1.71 cm/m  RA Area:     10.30 cm LA Vol (A2C):   30.4 ml 17.92 ml/m RA Volume:   17.90 ml  10.55 ml/m LA Vol (A4C):   21.9 ml 12.91 ml/m LA Biplane Vol: 27.2 ml 16.04 ml/m  AORTIC VALVE LVOT Vmax:   59.40 cm/s LVOT Vmean:  43.600 cm/s LVOT VTI:    0.098 m  AORTA Ao Root diam: 3.30 cm MITRAL VALVE               TRICUSPID VALVE MV Area (PHT): 3.42 cm    TR Peak grad:   15.4 mmHg MV Decel Time: 222 msec    TR Vmax:        196.00 cm/s MV E velocity: 73.30 cm/s MV A velocity: 69.40 cm/s  SHUNTS MV E/A ratio:  1.06        Systemic VTI:  0.10 m                            Systemic Diam: 1.90 cm Oswaldo Milian MD Electronically signed by Oswaldo Milian MD Signature Date/Time: 03/07/2020/2:17:13 PM    Final     Lab Results:  CBC    Component Value Date/Time   WBC 5.7 03/08/2020 0636   RBC 4.47 03/08/2020 0636   HGB 14.2 03/08/2020 0636   HCT 42.2 03/08/2020 0636   PLT 452 (H) 03/08/2020 0636   MCV 94.4 03/08/2020 0636   MCH 31.8 03/08/2020 0636   MCHC 33.6 03/08/2020 0636   RDW 13.6  03/08/2020 0636   LYMPHSABS 787 (L) 11/06/2019 1440   MONOABS 0.2 05/27/2018 0951   EOSABS 12 (L) 11/06/2019 1440   BASOSABS 31 11/06/2019 1440    BMET    Component Value Date/Time   NA 132 (L) 03/08/2020 0636   K 3.7 03/08/2020 0636   CL 98 03/08/2020 0636   CO2 25 03/08/2020 0636   GLUCOSE 101 (H) 03/08/2020 0636   BUN 12 03/08/2020 0636   CREATININE 0.77 03/08/2020 0636   CREATININE 0.83 11/06/2019 1440   CALCIUM 9.0 03/08/2020 0636   GFRNONAA >60 03/08/2020 0636   GFRNONAA 90 08/05/2018 1151   GFRAA >60 03/08/2020 0636   GFRAA 105 08/05/2018 1151    BNP No results found for: BNP  ProBNP No results found for: PROBNP  Specialty Problems      Pulmonary Problems   COPD (chronic obstructive pulmonary disease) (HCC)      No Known  Allergies  Immunization History  Administered Date(s) Administered  . Fluad Quad(high Dose 65+) 07/07/2019  . Influenza Whole 07/25/2009  . Influenza, High Dose Seasonal PF 09/03/2017, 08/05/2018  . Influenza,inj,Quad PF,6+ Mos 06/19/2013, 08/15/2014, 07/17/2015, 07/15/2016  . Pneumococcal Conjugate-13 08/15/2014  . Pneumococcal Polysaccharide-23 08/26/2016  . Zoster Recombinat (Shingrix) 03/07/2018, 06/15/2018   Needs covid vaccine   Past Medical History:  Diagnosis Date  . COPD (chronic obstructive pulmonary disease) (HCC)    not on home O2  . DJD (degenerative joint disease)    diffusely  . GERD (gastroesophageal reflux disease)   . Hyperlipidemia   . MI (myocardial infarction) ([place]) 2006   small MI and stents x 3  Dr. Doreatha Lew  . PUD (peptic ulcer disease)    with bleeding  . Rotator cuff disorder    has been evaluated by Dr Clifton James and Koleen Distance    Tobacco History: Social History   Tobacco Use  Smoking Status Former Smoker  . Packs/day: 1.00  . Years: 40.00  . Pack years: 40.00  . Types: Cigarettes  . Quit date: 27  . Years since quitting: 76.6  Smokeless Tobacco Never Used  Tobacco Comment   declines patch   Counseling given: Not Answered Comment: declines patch  Smoking assessment and cessation counseling  Patient currently smoking: 10 cigarettes a day I have advised the patient to quit/stop smoking as soon as possible due to high risk for multiple medical problems.  It will also be very difficult for [place] to manage patient's  respiratory symptoms and status if we continue to expose her lungs to a known irritant.  We do not advise e-cigarettes as a form of stopping smoking.  Patient is not willing to quit smoking.  He has not set a quit date.  He reports that he will quit in his own time.  He is not interested in smoking cessation assistance.  He is currently enrolled in the lung cancer screening program.  Next ordered CT chest should be in  December/2021.  I have advised the patient that we can assist and have options of nicotine replacement therapy, provided smoking cessation education today, provided smoking cessation counseling, and provided cessation resources.  Follow-up next office visit office visit for assessment of smoking cessation.    Smoking cessation counseling advised for: 5 min     Outpatient Encounter Medications as of 03/12/2020  Medication Sig  . albuterol (PROVENTIL) (2.5 MG/3ML) 0.083% nebulizer solution Take 3 mLs (2.5 mg total) by nebulization every 4 (four) hours as needed for wheezing or shortness of breath.  Marland Kitchen  albuterol (VENTOLIN HFA) 108 (90 Base) MCG/ACT inhaler INHALE 2 PUFFS INTO THE LUNGS EVERY SIX HOURS AS NEEDED FOR FOR WHEEZING AND SHORTNESS OF BREATH (Patient taking differently: Inhale 2 puffs into the lungs every 6 (six) hours as needed for wheezing or shortness of breath. )  . aspirin 81 MG tablet Take 81 mg by mouth at bedtime.   Marland Kitchen atorvastatin (LIPITOR) 40 MG tablet Take 1 tablet (40 mg total) by mouth at bedtime.  . budesonide-formoterol (SYMBICORT) 160-4.5 MCG/ACT inhaler Inhale 2 puffs into the lungs 2 (two) times daily.  . feeding supplement, ENSURE ENLIVE, (ENSURE ENLIVE) LIQD Take 237 mLs by mouth 3 (three) times daily between meals for 7 days.  . fluticasone (FLONASE) 50 MCG/ACT nasal spray USE 2 SPRAYS IN EACH NOSTRIL EVERY DAY (Patient taking differently: Place 2 sprays into both nostrils as needed for allergies. )  . folic acid (FOLVITE) 1 MG tablet Take 1 tablet (1 mg total) by mouth daily.  . Glucosamine HCl (GLUCOSAMINE PO) Take 1,500 mg by mouth daily.  . hydrOXYzine (ATARAX/VISTARIL) 10 MG tablet TAKE 1 TABLET BY MOUTH 3 TIMES DAILY AS NEEDED (Patient taking differently: Take 10 mg by mouth as needed for anxiety. )  . Multiple Vitamin (MULTIVITAMIN WITH MINERALS) TABS tablet Take 1 tablet by mouth daily.  . nitroGLYCERIN (NITROSTAT) 0.4 MG SL tablet dissolve 1 TABLET UNDER THE  TONGUE every 5 minutes AS NEEDED CHEST PAIN (Patient taking differently: Place 0.4 mg under the tongue every 5 (five) minutes as needed for chest pain. )  . omeprazole (PRILOSEC) 40 MG capsule TAKE 1 CAPSULE BY MOUTH EVERY DAY (Patient taking differently: Take 40 mg by mouth daily. )  . predniSONE (DELTASONE) 10 MG tablet 4 tabs for 2 days, then 3 tabs for 2 days, 2 tabs for 2 days, then 1 tab for 2 days, then stop  . thiamine 100 MG tablet Take 1 tablet (100 mg total) by mouth daily.  Marland Kitchen tiotropium (SPIRIVA HANDIHALER) 18 MCG inhalation capsule Place 1 capsule (18 mcg total) into inhaler and inhale daily.  Marland Kitchen tiZANidine (ZANAFLEX) 4 MG tablet TAKE 1 TABLET BY MOUTH EVERY EIGHT HOURS AS NEEDED FOR MUSCLE SPASMS (Patient taking differently: Take 4 mg by mouth as needed for muscle spasms. )  . traMADol (ULTRAM) 50 MG tablet TAKE 1 TABLET BY MOUTH EVERY 6 HOURS AS NEEDED FOR moderate PAIN (Patient taking differently: Take 50 mg by mouth as needed for moderate pain. )  . Budeson-Glycopyrrol-Formoterol (BREZTRI AEROSPHERE) 160-9-4.8 MCG/ACT AERO Inhale 2 puffs into the lungs 2 (two) times daily.  . Budeson-Glycopyrrol-Formoterol (BREZTRI AEROSPHERE) 160-9-4.8 MCG/ACT AERO Inhale 2 puffs into the lungs 2 (two) times daily.   No facility-administered encounter medications on file as of 03/12/2020.     Review of Systems  Review of Systems  Constitutional: Positive for fatigue. Negative for activity change, chills, fever and unexpected weight change.  HENT: Negative for postnasal drip, rhinorrhea, sinus pressure, sinus pain and sore throat.   Eyes: Negative.   Respiratory: Positive for shortness of breath and wheezing. Negative for cough.   Cardiovascular: Negative for chest pain and palpitations.  Gastrointestinal: Negative for constipation, diarrhea, nausea and vomiting.  Endocrine: Negative.   Genitourinary: Negative.   Musculoskeletal: Negative.   Skin: Negative.   Neurological: Negative for  dizziness and headaches.  Psychiatric/Behavioral: Negative.  Negative for dysphoric mood. The patient is not nervous/anxious.   All other systems reviewed and are negative.    Physical Exam  BP (!) 130/66 (BP  Location: Left Arm, Cuff Size: Normal)   Pulse 73   Temp 97.6 F (36.4 C) (Oral)   Ht 5\' 8"  (1.727 m)   Wt 123 lb 3.2 oz (55.9 kg)   SpO2 92%   BMI 18.73 kg/m   Wt Readings from Last 5 Encounters:  03/12/20 123 lb 3.2 oz (55.9 kg)  03/08/20 118 lb 3.2 oz (53.6 kg)  11/06/19 129 lb (58.5 kg)  07/07/19 124 lb (56.2 kg)  02/01/19 128 lb (58.1 kg)    BMI Readings from Last 5 Encounters:  03/12/20 18.73 kg/m  03/08/20 17.97 kg/m  11/06/19 19.61 kg/m  07/07/19 18.85 kg/m  02/01/19 19.46 kg/m     Physical Exam Vitals and nursing note reviewed.  Constitutional:      General: He is not in acute distress.    Appearance: Normal appearance. He is normal weight.     Comments: Thin frail elderly male  HENT:     Head: Normocephalic and atraumatic.     Right Ear: Hearing, tympanic membrane, ear canal and external ear normal. There is no impacted cerumen.     Left Ear: Hearing, tympanic membrane, ear canal and external ear normal. There is no impacted cerumen.     Nose: Nose normal. No mucosal edema or rhinorrhea.     Right Turbinates: Not enlarged.     Left Turbinates: Not enlarged.     Mouth/Throat:     Mouth: Mucous membranes are dry.     Pharynx: Oropharynx is clear. No oropharyngeal exudate.  Eyes:     Pupils: Pupils are equal, round, and reactive to light.  Cardiovascular:     Rate and Rhythm: Normal rate and regular rhythm.     Pulses: Normal pulses.     Heart sounds: Normal heart sounds. No murmur heard.   Pulmonary:     Effort: Pulmonary effort is normal.     Breath sounds: Decreased breath sounds present. No wheezing or rales.     Comments: Severely diminished breath sounds throughout exam Musculoskeletal:     Cervical back: Normal range of motion.      Right lower leg: No edema.     Left lower leg: No edema.  Lymphadenopathy:     Cervical: No cervical adenopathy.  Skin:    General: Skin is warm and dry.     Capillary Refill: Capillary refill takes less than 2 seconds.     Findings: No erythema or rash.  Neurological:     General: No focal deficit present.     Mental Status: He is alert and oriented to person, place, and time.     Motor: No weakness.     Coordination: Coordination normal.     Gait: Gait is intact. Gait normal.  Psychiatric:        Attention and Perception: He is inattentive.        Mood and Affect: Mood normal. Affect is flat.        Behavior: Behavior is withdrawn.        Thought Content: Thought content normal.        Judgment: Judgment normal.       Assessment & Plan:   COPD (chronic obstructive pulmonary disease) ([place]) Recent hospitalization Walk today in office and no oxygen desaturations Diminished breath sounds throughout exam Likely patient COPD has advanced since 2014 pulmonary function test that showed COPD Gold stage III with a diffusion defect Patient still smoking, declined smoking cessation assistance today  Plan: We will consolidate inhalers given  confusion of how to use them. Start Breztri  Stop Symbicort 160 Stop Spiriva HandiHaler 6 to 8-week follow-up with our office with a pulmonary function test and follow-up with Dr. Lamonte Chambers Walk today in office, no oxygen desaturations Emphasized need to stop smoking Repeat lung cancer screening CT in December/2021  Healthcare maintenance Plan: Provided resources and contact information for COVID-19 vaccines Emphasized need to obtain a Covid vaccine Emphasized need to stop smoking  TOBACCO ABUSE Current smoker Smoking 10 cigarettes a day Not interested in stopping smoking at this time  Plan: Emphasized need to stop smoking Patient's continued smoking makes it very difficult to manage his breathing as well as will increase his chances of  having COPD exacerbations and recurrent hospitalizations We will repeat pulmonary function testing    Return in about 2 months (around 05/13/2020), or if symptoms worsen or fail to improve, for Follow up with Dr. Lamonte Chambers, Follow up for FULL PFT - 60 min.   Lauraine Rinne, NP 03/12/2020   This appointment required 45 minutes of patient care (this includes precharting, chart review, review of results, face-to-face care, etc.).

## 2020-03-12 NOTE — Assessment & Plan Note (Signed)
Current smoker Smoking 10 cigarettes a day Not interested in stopping smoking at this time  Plan: Emphasized need to stop smoking Patient's continued smoking makes it very difficult to manage his breathing as well as will increase his chances of having COPD exacerbations and recurrent hospitalizations We will repeat pulmonary function testing

## 2020-03-12 NOTE — Patient Instructions (Addendum)
You were seen today by Lauraine Rinne, NP  for:   1. Chronic obstructive pulmonary disease, unspecified COPD type ([place])  Start Breztri >>> 2 puffs in the morning right when you wake up, rinse out your mouth after use, 12 hours later 2 puffs, rinse after use >>> Take this daily, no matter what >>> This is not a rescue inhaler   Stop Symbicort 160 and Spiriva HandiHaler  Only use your albuterol as a rescue medication to be used if you can't catch your breath by resting or doing a relaxed purse lip breathing pattern.  - The less you use it, the better it will work when you need it. - Ok to use up to 2 puffs  every 4 hours if you must but call for immediate appointment if use goes up over your usual need - Don't leave home without it !!  (think of it like the spare tire for your car)   Note your daily symptoms > remember "red flags" for COPD:   >>>Increase in cough >>>increase in sputum production >>>increase in shortness of breath or activity  intolerance.   If you notice these symptoms, please call the office to be seen.   You need to obtain pulmonary function testing to further evaluate your lung functioning  2. TOBACCO ABUSE  We recommend that you stop smoking.  >>>You need to set a quit date >>>If you have friends or family who smoke, let them know you are trying to quit and not to smoke around you or in your living environment  Smoking Cessation Resources:  1 800 QUIT NOW  >>> Patient to call this resource and utilize it to help support her quit smoking >>> Keep up your hard work with stopping smoking  You can also contact the [facility] >>>For smoking cessation classes call [phone]  We do not recommend using e-cigarettes as a form of stopping smoking  You can sign up for smoking cessation support texts and information:  >>>https://smokefree.gov/smokefreetxt  We will repeat a lung cancer screening CT as planned in December/2021  3. Healthcare  maintenance  We will repeat a lung cancer screening CT in December/2021  COVID-19 Vaccine Information can be found at: ShippingScam.co.uk For questions related to vaccine distribution or appointments, please email [email or web address] or call [phone].    We recommend today:   Meds ordered this encounter  Medications  . Budeson-Glycopyrrol-Formoterol (BREZTRI AEROSPHERE) 160-9-4.8 MCG/ACT AERO    Sig: Inhale 2 puffs into the lungs 2 (two) times daily.    Dispense:  5.9 g    Refill:  0    Follow Up:    Return in about 2 months (around 05/13/2020), or if symptoms worsen or fail to improve, for Follow up with Dr. Lamonte Sakai, Follow up for FULL PFT - 60 min.   Please do your part to reduce the spread of COVID-19:      Reduce your risk of any infection  and COVID19 by using the similar precautions used for avoiding the common cold or flu:  Marland Kitchen Wash your hands often with soap and warm water for at least 20 seconds.  If soap and water are not readily available, use an alcohol-based hand sanitizer with at least 60% alcohol.  . If coughing or sneezing, cover your mouth and nose by coughing or sneezing into the elbow areas of your shirt or coat, into a tissue or into your sleeve (not your hands). Langley Gauss A MASK when in public  . Avoid  shaking hands with others and consider head nods or verbal greetings only. . Avoid touching your eyes, nose, or mouth with unwashed hands.  . Avoid close contact with people who are sick. . Avoid places or events with large numbers of people in one location, like concerts or sporting events. . If you have some symptoms but not all symptoms, continue to monitor at home and seek medical attention if your symptoms worsen. . If you are having a medical emergency, call 911.   [place] / e-Visit: [email or web address]          [facility]: [facility]: [phone]                   [facility]: [phone]     It is flu season:   >>> Best ways to protect herself from the flu: Receive the yearly flu vaccine, practice good hand hygiene washing with soap and also using hand sanitizer when available, eat a nutritious meals, get adequate rest, hydrate appropriately   Please contact the office if your symptoms worsen or you have concerns that you are not improving.   Thank you for choosing [facility] for your healthcare, and for allowing [place] to partner with you on your healthcare journey. I am thankful to be able to provide care to you today.   Wyn Quaker FNP-C

## 2020-03-12 NOTE — Assessment & Plan Note (Signed)
Recent hospitalization Walk today in office and no oxygen desaturations Diminished breath sounds throughout exam Likely patient COPD has advanced since 2014 pulmonary function test that showed COPD Gold stage III with a diffusion defect Patient still smoking, declined smoking cessation assistance today  Plan: We will consolidate inhalers given confusion of how to use them. Start Breztri  Stop Symbicort 160 Stop Spiriva HandiHaler 6 to 8-week follow-up with our office with a pulmonary function test and follow-up with Dr. Lamonte Sakai Walk today in office, no oxygen desaturations Emphasized need to stop smoking Repeat lung cancer screening CT in December/2021

## 2020-03-14 DIAGNOSIS — J449 Chronic obstructive pulmonary disease, unspecified: Secondary | ICD-10-CM | POA: Diagnosis not present

## 2020-04-03 ENCOUNTER — Ambulatory Visit: Payer: Medicare HMO

## 2020-04-03 ENCOUNTER — Ambulatory Visit: Payer: Medicare HMO | Admitting: Physician Assistant

## 2020-04-14 DIAGNOSIS — J449 Chronic obstructive pulmonary disease, unspecified: Secondary | ICD-10-CM | POA: Diagnosis not present

## 2020-04-16 ENCOUNTER — Other Ambulatory Visit: Payer: Self-pay | Admitting: Family Medicine

## 2020-04-19 ENCOUNTER — Inpatient Hospital Stay (HOSPITAL_COMMUNITY): Admission: RE | Admit: 2020-04-19 | Payer: Medicare HMO | Source: Ambulatory Visit

## 2020-04-23 ENCOUNTER — Encounter: Payer: Self-pay | Admitting: *Deleted

## 2020-04-23 ENCOUNTER — Ambulatory Visit: Payer: Medicare HMO | Admitting: Emergency Medicine

## 2020-04-25 ENCOUNTER — Ambulatory Visit: Payer: Medicare HMO | Admitting: Physician Assistant

## 2020-05-10 ENCOUNTER — Ambulatory Visit: Payer: Medicare HMO | Admitting: Family Medicine

## 2020-05-15 ENCOUNTER — Ambulatory Visit: Payer: Medicare HMO | Admitting: Family Medicine

## 2020-05-15 DIAGNOSIS — J449 Chronic obstructive pulmonary disease, unspecified: Secondary | ICD-10-CM | POA: Diagnosis not present

## 2020-05-23 ENCOUNTER — Ambulatory Visit: Payer: Medicare HMO | Admitting: Physician Assistant

## 2020-05-23 NOTE — Progress Notes (Deleted)
Cardiology Office Note    Date:  05/23/2020   ID:  CORIN TILLY, DOB 1950/08/29, MRN [id number]  PCP:  [name], MD  Cardiologist:  Dr. [name]   Chief Complaint: Hospital follow up   History of Present Illness:   Zachary Chambers is a 69 y.o. male with a hx of CAD s/p PCI to LCx and most recent intervention to the LAD in 08/2001, current tobacco use with COPD follow by pulmonology, AAA, GERD and HLD presents for hospital follow up.   Admitted 02/2020 with chest pain. Troponin negative. EKG non ischemic. Myoview was low risk. Echo with LVEF 55-60%.      Past Medical History:  Diagnosis Date  . COPD (chronic obstructive pulmonary disease) (HCC)    not on home O2  . DJD (degenerative joint disease)    diffusely  . GERD (gastroesophageal reflux disease)   . Hyperlipidemia   . MI (myocardial infarction) ([place]) 2006   small MI and stents x 3  Dr. Doreatha Lew  . PUD (peptic ulcer disease)    with bleeding  . Rotator cuff disorder    has been evaluated by Dr Clifton James and Koleen Distance    Past Surgical History:  Procedure Laterality Date  . COLONOSCOPY    . 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 heart stent     x3- 2006    Current Medications: Prior to Admission medications   Medication Sig Start Date End Date Taking? Authorizing Provider  albuterol (PROVENTIL) (2.5 MG/3ML) 0.083% nebulizer solution Take 3 mLs (2.5 mg total) by nebulization every 4 (four) hours as needed for wheezing or shortness of breath. 01/17/19   [place], Fonnie Mu, NP  albuterol (VENTOLIN HFA) 108 (90 Base) MCG/ACT inhaler INHALE 2 PUFFS INTO THE LUNGS EVERY SIX HOURS AS NEEDED FOR FOR WHEEZING AND SHORTNESS OF BREATH Patient taking differently: Inhale 2 puffs into the lungs every 6 (six) hours as needed for wheezing or shortness of breath.  02/22/20   Collene Gobble, MD  aspirin 81 MG tablet Take 81 mg by mouth at bedtime.     [provider]  atorvastatin (LIPITOR) 40 MG tablet Take 1 tablet  (40 mg total) by mouth at bedtime. 03/08/20 06/06/20  Kayleen Memos, DO  Budeson-Glycopyrrol-Formoterol (BREZTRI AEROSPHERE) 160-9-4.8 MCG/ACT AERO Inhale 2 puffs into the lungs 2 (two) times daily. 03/12/20   Lauraine Rinne, NP  Budeson-Glycopyrrol-Formoterol (BREZTRI AEROSPHERE) 160-9-4.8 MCG/ACT AERO Inhale 2 puffs into the lungs 2 (two) times daily. 03/12/20   Lauraine Rinne, NP  budesonide-formoterol (SYMBICORT) 160-4.5 MCG/ACT inhaler Inhale 2 puffs into the lungs 2 (two) times daily. 11/01/19   Lauraine Rinne, NP  fluticasone (FLONASE) 50 MCG/ACT nasal spray USE 2 SPRAYS in [facility] NOSTRIL EVERY DAY 04/17/20   [place], Modena Nunnery, MD  folic acid (FOLVITE) 1 MG tablet Take 1 tablet (1 mg total) by mouth daily. 03/09/20 06/07/20  Kayleen Memos, DO  Glucosamine HCl (GLUCOSAMINE PO) Take 1,500 mg by mouth daily.    [provider]  hydrOXYzine (ATARAX/VISTARIL) 10 MG tablet TAKE 1 TABLET BY MOUTH 3 TIMES DAILY AS NEEDED Patient taking differently: Take 10 mg by mouth as needed for anxiety.  12/22/18   [name], MD  Multiple Vitamin (MULTIVITAMIN WITH MINERALS) TABS tablet Take 1 tablet by mouth daily. 03/09/20   Kayleen Memos, DO  nitroGLYCERIN (NITROSTAT) 0.4 MG SL tablet dissolve 1 TABLET UNDER THE TONGUE  every 5 minutes AS NEEDED CHEST PAIN Patient taking differently: Place 0.4 mg under the tongue every 5 (five) minutes as needed for chest pain.  12/05/18   [place], Modena Nunnery, MD  omeprazole (PRILOSEC) 40 MG capsule TAKE 1 CAPSULE BY MOUTH EVERY DAY Patient taking differently: Take 40 mg by mouth daily.  10/06/19   [place], Modena Nunnery, MD  predniSONE (DELTASONE) 10 MG tablet 4 tabs for 2 days, then 3 tabs for 2 days, 2 tabs for 2 days, then 1 tab for 2 days, then stop 03/05/20   Lauraine Rinne, NP  thiamine 100 MG tablet Take 1 tablet (100 mg total) by mouth daily. 03/09/20 06/07/20  Kayleen Memos, DO  tiotropium (SPIRIVA HANDIHALER) 18 MCG inhalation capsule Place 1 capsule (18 mcg total) into  inhaler and inhale daily. 02/22/20   Collene Gobble, MD  tiZANidine (ZANAFLEX) 4 MG tablet TAKE 1 TABLET BY MOUTH EVERY EIGHT HOURS AS NEEDED FOR MUSCLE SPASMS Patient taking differently: Take 4 mg by mouth as needed for muscle spasms.  11/27/19   [place], Modena Nunnery, MD  traMADol (ULTRAM) 50 MG tablet TAKE 1 TABLET BY MOUTH EVERY 6 HOURS AS NEEDED FOR moderate PAIN Patient taking differently: Take 50 mg by mouth as needed for moderate pain.  07/07/19   [name], MD    Allergies:   Patient has no known allergies.   Social History   Socioeconomic History  . Marital status: Single    Spouse name: Not on file  . Number of children: Not on file  . Years of education: Not on file  . Highest education level: Not on file  Occupational History  . Occupation: retired  Tobacco Use  . Smoking status: Former Smoker    Packs/day: 1.00    Years: 40.00    Pack years: 40.00    Types: Cigarettes    Quit date: 1945    Years since quitting: 76.8  . Smokeless tobacco: Never Used  . Tobacco comment: declines patch  Vaping Use  . Vaping Use: Never used  Substance and Sexual Activity  . Alcohol use: Yes    Alcohol/week: 30.0 standard drinks    Types: 30 Cans of beer per week    Comment: h/o heavy use, now 4 beers per day  . Drug use: Not Currently  . Sexual activity: Yes  Other Topics Concern  . Not on file  Social History Narrative  . Not on file   Social Determinants of Health   Financial Resource Strain:   . Difficulty of Paying Living Expenses: Not on file  Food Insecurity:   . Worried About Charity fundraiser in the Last Year: Not on file  . Ran Out of Food in the Last Year: Not on file  Transportation Needs:   . Lack of Transportation (Medical): Not on file  . Lack of Transportation (Non-Medical): Not on file  Physical Activity:   . Days of Exercise per Week: Not on file  . Minutes of Exercise per Session: Not on file  Stress:   . Feeling of Stress : Not on file  Social  Connections:   . Frequency of Communication with Friends and Family: Not on file  . Frequency of Social Gatherings with Friends and Family: Not on file  . Attends Religious Services: Not on file  . Active Member of Clubs or Organizations: Not on file  . Attends Archivist Meetings: Not on file  . Marital Status: Not on  file     Family History:  The patient's family history includes COPD in his father; Colon cancer in his paternal grandfather; Diabetes in his maternal grandfather and paternal grandfather; Ovarian cancer in his mother. ***  ROS:   Please see the history of present illness.    ROS All other systems reviewed and are negative.   PHYSICAL EXAM:   VS:  There were no vitals taken for this visit.   GEN: Well nourished, well developed, in no acute distress  HEENT: normal  Neck: no JVD, carotid bruits, or masses Cardiac: ***RRR; no murmurs, rubs, or gallops,no edema  Respiratory:  clear to auscultation bilaterally, normal work of breathing GI: soft, nontender, nondistended, + BS MS: no deformity or atrophy  Skin: warm and dry, no rash Neuro:  Alert and Oriented x 3, Strength and sensation are intact Psych: euthymic mood, full affect  Wt Readings from Last 3 Encounters:  03/12/20 123 lb 3.2 oz (55.9 kg)  03/08/20 118 lb 3.2 oz (53.6 kg)  11/06/19 129 lb (58.5 kg)      Studies/Labs Reviewed:   EKG:  EKG is ordered today.  The ekg ordered today demonstrates ***  Recent Labs: 11/06/2019: ALT 8 03/06/2020: Magnesium 1.8; TSH 2.191 03/08/2020: BUN 12; Creatinine, Ser 0.77; Hemoglobin 14.2; Platelets 452; Potassium 3.7; Sodium 132   Lipid Panel    Component Value Date/Time   CHOL 118 03/08/2020 0636   TRIG 41 03/08/2020 0636   HDL 49 03/08/2020 0636   CHOLHDL 2.4 03/08/2020 0636   VLDL 8 03/08/2020 0636   LDLCALC 61 03/08/2020 0636   LDLCALC 60 11/06/2019 1440    Additional studies/ records that were reviewed today include:   Echocardiogram:  03/07/20 1. Left ventricular ejection fraction, by estimation, is 55 to 60%. The  left ventricle has normal function. The left ventricle has no regional  wall motion abnormalities. Left ventricular diastolic parameters were  normal.  2. Right ventricular systolic function is normal. The right ventricular  size is normal. There is normal pulmonary artery systolic pressure. The  estimated right ventricular systolic pressure is 73.7 mmHg.  3. The mitral valve is normal in structure. Trivial mitral valve  regurgitation.  4. The aortic valve is tricuspid. Aortic valve regurgitation is not  visualized. Mild aortic valve sclerosis is present, with no evidence of  aortic valve stenosis.  5. Small pericardial effusion  6. The inferior vena cava is normal in size with greater than 50%  respiratory variability, suggesting right atrial pressure of 3 mmHg.   Stress test 03/08/20  ST segment depression was noted during stress.  Defect 1: There is a small defect of mild severity present in the apex location.  This is a low risk study.  Nuclear stress EF: 63%.   Small size, mild intensity fixed apical perfusion defect, likely artifact. No significant reversible ischemia. LVEF 63% with normal wall motion. This is a low risk study.    ASSESSMENT & PLAN:    1. CAD  2. HLD  3. Tobacco abuse    Medication Adjustments/Labs and Tests Ordered: Current medicines are reviewed at length with the patient today.  Concerns regarding medicines are outlined above.  Medication changes, Labs and Tests ordered today are listed in the Patient Instructions below. There are no Patient Instructions on file for this visit.   Jarrett Soho, [place]  05/23/2020 1:25 PM    [facility] [place], [place], [place]  [address] Phone: [phone]; Fax: [phone]  

## 2020-05-24 ENCOUNTER — Ambulatory Visit: Payer: Medicare HMO | Admitting: Family Medicine

## 2020-05-27 ENCOUNTER — Other Ambulatory Visit: Payer: Self-pay | Admitting: Family Medicine

## 2020-06-14 DIAGNOSIS — J449 Chronic obstructive pulmonary disease, unspecified: Secondary | ICD-10-CM | POA: Diagnosis not present

## 2020-06-17 ENCOUNTER — Other Ambulatory Visit: Payer: Self-pay | Admitting: Emergency Medicine

## 2020-06-18 ENCOUNTER — Other Ambulatory Visit: Payer: Self-pay | Admitting: Family Medicine

## 2020-06-25 ENCOUNTER — Telehealth: Payer: Self-pay | Admitting: *Deleted

## 2020-06-25 NOTE — Telephone Encounter (Signed)
Received call from patient.   Reports that he is attempting to pay out of pocket for [employer] portable oxygen concentrator. States that he was advised he requires a prescription for concentrator.   MD reviewed and reports that patient will need to get prescription from pulmonology as they will need to make sure he is a good candidate for concentrator.   Call placed to patient and patient made aware per VM.

## 2020-06-27 ENCOUNTER — Other Ambulatory Visit: Payer: Self-pay | Admitting: Family Medicine

## 2020-07-04 ENCOUNTER — Encounter (HOSPITAL_COMMUNITY): Payer: Self-pay

## 2020-07-04 ENCOUNTER — Emergency Department (HOSPITAL_COMMUNITY): Payer: Medicare HMO

## 2020-07-04 ENCOUNTER — Inpatient Hospital Stay (HOSPITAL_COMMUNITY)
Admission: EM | Admit: 2020-07-04 | Discharge: 2020-07-10 | DRG: 177 | Disposition: A | Discharge status: Home / Self Care | Payer: Medicare HMO | Attending: Internal Medicine | Admitting: Internal Medicine

## 2020-07-04 ENCOUNTER — Other Ambulatory Visit: Payer: Self-pay

## 2020-07-04 ENCOUNTER — Observation Stay (HOSPITAL_COMMUNITY): Payer: Medicare HMO

## 2020-07-04 DIAGNOSIS — K219 Gastro-esophageal reflux disease without esophagitis: Secondary | ICD-10-CM | POA: Diagnosis present

## 2020-07-04 DIAGNOSIS — Z955 Presence of coronary angioplasty implant and graft: Secondary | ICD-10-CM | POA: Diagnosis not present

## 2020-07-04 DIAGNOSIS — I2510 Atherosclerotic heart disease of native coronary artery without angina pectoris: Secondary | ICD-10-CM | POA: Diagnosis present

## 2020-07-04 DIAGNOSIS — M1990 Unspecified osteoarthritis, unspecified site: Secondary | ICD-10-CM | POA: Diagnosis present

## 2020-07-04 DIAGNOSIS — R0689 Other abnormalities of breathing: Secondary | ICD-10-CM | POA: Diagnosis not present

## 2020-07-04 DIAGNOSIS — Z7982 Long term (current) use of aspirin: Secondary | ICD-10-CM | POA: Diagnosis not present

## 2020-07-04 DIAGNOSIS — J9601 Acute respiratory failure with hypoxia: Secondary | ICD-10-CM

## 2020-07-04 DIAGNOSIS — Z791 Long term (current) use of non-steroidal anti-inflammatories (NSAID): Secondary | ICD-10-CM

## 2020-07-04 DIAGNOSIS — Z833 Family history of diabetes mellitus: Secondary | ICD-10-CM

## 2020-07-04 DIAGNOSIS — J189 Pneumonia, unspecified organism: Secondary | ICD-10-CM

## 2020-07-04 DIAGNOSIS — U071 COVID-19: Principal | ICD-10-CM | POA: Diagnosis present

## 2020-07-04 DIAGNOSIS — R0602 Shortness of breath: Secondary | ICD-10-CM | POA: Diagnosis not present

## 2020-07-04 DIAGNOSIS — J441 Chronic obstructive pulmonary disease with (acute) exacerbation: Secondary | ICD-10-CM | POA: Diagnosis present

## 2020-07-04 DIAGNOSIS — J1282 Pneumonia due to coronavirus disease 2019: Secondary | ICD-10-CM | POA: Insufficient documentation

## 2020-07-04 DIAGNOSIS — R0600 Dyspnea, unspecified: Secondary | ICD-10-CM | POA: Diagnosis present

## 2020-07-04 DIAGNOSIS — I480 Paroxysmal atrial fibrillation: Secondary | ICD-10-CM

## 2020-07-04 DIAGNOSIS — E46 Unspecified protein-calorie malnutrition: Secondary | ICD-10-CM | POA: Diagnosis present

## 2020-07-04 DIAGNOSIS — J449 Chronic obstructive pulmonary disease, unspecified: Secondary | ICD-10-CM | POA: Diagnosis not present

## 2020-07-04 DIAGNOSIS — Z87891 Personal history of nicotine dependence: Secondary | ICD-10-CM

## 2020-07-04 DIAGNOSIS — J9611 Chronic respiratory failure with hypoxia: Secondary | ICD-10-CM

## 2020-07-04 DIAGNOSIS — J440 Chronic obstructive pulmonary disease with acute lower respiratory infection: Secondary | ICD-10-CM | POA: Diagnosis present

## 2020-07-04 DIAGNOSIS — Z825 Family history of asthma and other chronic lower respiratory diseases: Secondary | ICD-10-CM | POA: Diagnosis not present

## 2020-07-04 DIAGNOSIS — M6284 Sarcopenia: Secondary | ICD-10-CM | POA: Diagnosis present

## 2020-07-04 DIAGNOSIS — N179 Acute kidney failure, unspecified: Secondary | ICD-10-CM | POA: Diagnosis present

## 2020-07-04 DIAGNOSIS — I252 Old myocardial infarction: Secondary | ICD-10-CM | POA: Diagnosis not present

## 2020-07-04 DIAGNOSIS — Z79899 Other long term (current) drug therapy: Secondary | ICD-10-CM

## 2020-07-04 DIAGNOSIS — R531 Weakness: Secondary | ICD-10-CM | POA: Diagnosis not present

## 2020-07-04 DIAGNOSIS — Z8041 Family history of malignant neoplasm of ovary: Secondary | ICD-10-CM

## 2020-07-04 DIAGNOSIS — Z681 Body mass index (BMI) 19 or less, adult: Secondary | ICD-10-CM | POA: Diagnosis not present

## 2020-07-04 DIAGNOSIS — I4891 Unspecified atrial fibrillation: Secondary | ICD-10-CM | POA: Diagnosis present

## 2020-07-04 DIAGNOSIS — Z7951 Long term (current) use of inhaled steroids: Secondary | ICD-10-CM

## 2020-07-04 DIAGNOSIS — R0902 Hypoxemia: Secondary | ICD-10-CM | POA: Diagnosis not present

## 2020-07-04 DIAGNOSIS — J9610 Chronic respiratory failure, unspecified whether with hypoxia or hypercapnia: Secondary | ICD-10-CM

## 2020-07-04 DIAGNOSIS — J9621 Acute and chronic respiratory failure with hypoxia: Secondary | ICD-10-CM | POA: Diagnosis present

## 2020-07-04 DIAGNOSIS — R410 Disorientation, unspecified: Secondary | ICD-10-CM | POA: Diagnosis not present

## 2020-07-04 DIAGNOSIS — Z800 Family history of malignant neoplasm of digestive organs: Secondary | ICD-10-CM

## 2020-07-04 DIAGNOSIS — R509 Fever, unspecified: Secondary | ICD-10-CM | POA: Diagnosis not present

## 2020-07-04 DIAGNOSIS — Z9981 Dependence on supplemental oxygen: Secondary | ICD-10-CM

## 2020-07-04 DIAGNOSIS — F1010 Alcohol abuse, uncomplicated: Secondary | ICD-10-CM | POA: Diagnosis present

## 2020-07-04 DIAGNOSIS — E785 Hyperlipidemia, unspecified: Secondary | ICD-10-CM | POA: Diagnosis present

## 2020-07-04 DIAGNOSIS — F17200 Nicotine dependence, unspecified, uncomplicated: Secondary | ICD-10-CM | POA: Diagnosis not present

## 2020-07-04 DIAGNOSIS — Z209 Contact with and (suspected) exposure to unspecified communicable disease: Secondary | ICD-10-CM | POA: Diagnosis not present

## 2020-07-04 LAB — COMPREHENSIVE METABOLIC PANEL
ALT: 23 U/L (ref 0–44)
AST: 55 U/L — ABNORMAL HIGH (ref 15–41)
Albumin: 3.8 g/dL (ref 3.5–5.0)
Alkaline Phosphatase: 55 U/L (ref 38–126)
Anion gap: 11 (ref 5–15)
BUN: 22 mg/dL (ref 8–23)
CO2: 26 mmol/L (ref 22–32)
Calcium: 8.5 mg/dL — ABNORMAL LOW (ref 8.9–10.3)
Chloride: 92 mmol/L — ABNORMAL LOW (ref 98–111)
Creatinine, Ser: 0.95 mg/dL (ref 0.61–1.24)
GFR, Estimated: >60 mL/min (ref >60)
Glucose, Bld: 128 mg/dL — ABNORMAL HIGH (ref 70–99)
Potassium: 3.7 mmol/L (ref 3.5–5.1)
Sodium: 129 mmol/L — ABNORMAL LOW (ref 135–145)
Total Bilirubin: 1.1 mg/dL (ref 0.3–1.2)
Total Protein: 7 g/dL (ref 6.5–8.1)

## 2020-07-04 LAB — CBC WITH DIFFERENTIAL/PLATELET
Abs Immature Granulocytes: 0.09 10*3/uL — ABNORMAL HIGH (ref 0.00–0.07)
Basophils Absolute: 0 10*3/uL (ref 0.0–0.1)
Basophils Relative: 0 %
Eosinophils Absolute: 0 10*3/uL (ref 0.0–0.5)
Eosinophils Relative: 0 %
HCT: 44.2 % (ref 39.0–52.0)
Hemoglobin: 15.4 g/dL (ref 13.0–17.0)
Immature Granulocytes: 2 %
Lymphocytes Relative: 6 %
Lymphs Abs: 0.3 10*3/uL — ABNORMAL LOW (ref 0.7–4.0)
MCH: 31.3 pg (ref 26.0–34.0)
MCHC: 34.8 g/dL (ref 30.0–36.0)
MCV: 89.8 fL (ref 80.0–100.0)
Monocytes Absolute: 1 10*3/uL (ref 0.1–1.0)
Monocytes Relative: 18 %
Neutro Abs: 4.1 10*3/uL (ref 1.7–7.7)
Neutrophils Relative %: 74 %
Platelets: 226 10*3/uL (ref 150–400)
RBC: 4.92 MIL/uL (ref 4.22–5.81)
RDW: 13.7 % (ref 11.5–15.5)
WBC: 5.5 10*3/uL (ref 4.0–10.5)
nRBC: 0 % (ref 0.0–0.2)

## 2020-07-04 LAB — FIBRINOGEN: Fibrinogen: 605 mg/dL — ABNORMAL HIGH (ref 210–475)

## 2020-07-04 LAB — C-REACTIVE PROTEIN: CRP: 20.4 mg/dL — ABNORMAL HIGH (ref <1.0)

## 2020-07-04 LAB — RESPIRATORY PANEL BY RT PCR (FLU A&B, COVID)
Influenza A by PCR: NEGATIVE
Influenza B by PCR: NEGATIVE
SARS Coronavirus 2 by RT PCR: POSITIVE — AB

## 2020-07-04 LAB — LACTATE DEHYDROGENASE: LDH: 331 U/L — ABNORMAL HIGH (ref 98–192)

## 2020-07-04 LAB — D-DIMER, QUANTITATIVE: D-Dimer, Quant: 4.02 ug/mL-FEU — ABNORMAL HIGH (ref 0.00–0.50)

## 2020-07-04 LAB — FERRITIN: Ferritin: 261 ng/mL (ref 24–336)

## 2020-07-04 LAB — PROCALCITONIN: Procalcitonin: 0.21 ng/mL

## 2020-07-04 LAB — TRIGLYCERIDES: Triglycerides: 46 mg/dL (ref <150)

## 2020-07-04 LAB — LACTIC ACID, PLASMA: Lactic Acid, Venous: 0.9 mmol/L (ref 0.5–1.9)

## 2020-07-04 MED ORDER — DEXAMETHASONE SODIUM PHOSPHATE 10 MG/ML IJ SOLN
6.0000 mg | INTRAMUSCULAR | Status: DC
  Administered 2020-07-05: 6 mg via INTRAVENOUS
  Filled 2020-07-04: qty 1

## 2020-07-04 MED ORDER — GUAIFENESIN ER 600 MG PO TB12
600.0000 mg | ORAL_TABLET | Freq: Two times a day (BID) | ORAL | Status: DC
  Administered 2020-07-04 – 2020-07-10 (×13): 600 mg via ORAL
  Filled 2020-07-04 (×13): qty 1

## 2020-07-04 MED ORDER — ONDANSETRON HCL 4 MG/2ML IJ SOLN: 4.0000 mg | Freq: Four times a day (QID) | INTRAMUSCULAR | Status: DC | PRN

## 2020-07-04 MED ORDER — SODIUM CHLORIDE 0.9 % IV BOLUS
500.0000 mL | Freq: Once | INTRAVENOUS | Status: AC
  Administered 2020-07-04: 500 mL via INTRAVENOUS

## 2020-07-04 MED ORDER — SODIUM CHLORIDE 0.9 % IV SOLN
100.0000 mg | Freq: Every day | INTRAVENOUS | Status: AC
  Administered 2020-07-05 – 2020-07-08 (×4): 100 mg via INTRAVENOUS
  Filled 2020-07-04 (×5): qty 20

## 2020-07-04 MED ORDER — DILTIAZEM LOAD VIA INFUSION
15.0000 mg | Freq: Once | INTRAVENOUS | Status: AC
  Administered 2020-07-04: 15 mg via INTRAVENOUS
  Filled 2020-07-04: qty 15

## 2020-07-04 MED ORDER — ACETAMINOPHEN 325 MG PO TABS
650.0000 mg | ORAL_TABLET | Freq: Once | ORAL | Status: AC
  Administered 2020-07-04: 650 mg via ORAL
  Filled 2020-07-04: qty 2

## 2020-07-04 MED ORDER — ACETAMINOPHEN 325 MG PO TABS: 650.0000 mg | ORAL_TABLET | Freq: Four times a day (QID) | ORAL | Status: DC | PRN

## 2020-07-04 MED ORDER — SODIUM CHLORIDE 0.9 % IV SOLN
500.0000 mg | Freq: Once | INTRAVENOUS | Status: AC
  Administered 2020-07-04: 500 mg via INTRAVENOUS
  Filled 2020-07-04: qty 500

## 2020-07-04 MED ORDER — SODIUM CHLORIDE 0.9 % IV SOLN: 1.0000 g | INTRAVENOUS | Status: DC

## 2020-07-04 MED ORDER — SODIUM CHLORIDE (PF) 0.9 % IJ SOLN
INTRAMUSCULAR | Status: AC
  Filled 2020-07-04: qty 50

## 2020-07-04 MED ORDER — ONDANSETRON HCL 4 MG PO TABS: 4.0000 mg | ORAL_TABLET | Freq: Four times a day (QID) | ORAL | Status: DC | PRN

## 2020-07-04 MED ORDER — AZITHROMYCIN 250 MG PO TABS: 500.0000 mg | ORAL_TABLET | Freq: Every day | ORAL | Status: DC

## 2020-07-04 MED ORDER — HYDROCOD POLST-CPM POLST ER 10-8 MG/5ML PO SUER
5.0000 mL | Freq: Two times a day (BID) | ORAL | Status: DC | PRN
  Administered 2020-07-04: 5 mL via ORAL
  Filled 2020-07-04: qty 5

## 2020-07-04 MED ORDER — IOHEXOL 350 MG/ML SOLN
100.0000 mL | Freq: Once | INTRAVENOUS | Status: AC | PRN
  Administered 2020-07-04: 100 mL via INTRAVENOUS

## 2020-07-04 MED ORDER — ACETAMINOPHEN 650 MG RE SUPP: 650.0000 mg | Freq: Four times a day (QID) | RECTAL | Status: DC | PRN

## 2020-07-04 MED ORDER — DILTIAZEM HCL-DEXTROSE 125-5 MG/125ML-% IV SOLN (PREMIX)
5.0000 mg/h | INTRAVENOUS | Status: DC
  Administered 2020-07-04: 5 mg/h via INTRAVENOUS
  Filled 2020-07-04: qty 125

## 2020-07-04 MED ORDER — SODIUM CHLORIDE 0.9 % IV SOLN
200.0000 mg | Freq: Once | INTRAVENOUS | Status: AC
  Administered 2020-07-04: 200 mg via INTRAVENOUS
  Filled 2020-07-04: qty 40

## 2020-07-04 MED ORDER — ENOXAPARIN SODIUM 40 MG/0.4ML ~~LOC~~ SOLN
40.0000 mg | Freq: Every day | SUBCUTANEOUS | Status: DC
  Administered 2020-07-04 – 2020-07-10 (×7): 40 mg via SUBCUTANEOUS
  Filled 2020-07-04 (×7): qty 0.4

## 2020-07-04 MED ORDER — IPRATROPIUM-ALBUTEROL 20-100 MCG/ACT IN AERS
1.0000 | INHALATION_SPRAY | Freq: Four times a day (QID) | RESPIRATORY_TRACT | Status: DC
  Administered 2020-07-04 – 2020-07-10 (×21): 1 via RESPIRATORY_TRACT
  Filled 2020-07-04 (×2): qty 4

## 2020-07-04 MED ORDER — ZINC SULFATE 220 (50 ZN) MG PO CAPS
220.0000 mg | ORAL_CAPSULE | Freq: Every day | ORAL | Status: DC
  Administered 2020-07-04 – 2020-07-10 (×7): 220 mg via ORAL
  Filled 2020-07-04 (×7): qty 1

## 2020-07-04 MED ORDER — SODIUM CHLORIDE 0.9 % IV SOLN: Freq: Once | INTRAVENOUS | Status: AC

## 2020-07-04 MED ORDER — ASCORBIC ACID 500 MG PO TABS
500.0000 mg | ORAL_TABLET | Freq: Every day | ORAL | Status: DC
  Administered 2020-07-04 – 2020-07-10 (×7): 500 mg via ORAL
  Filled 2020-07-04 (×7): qty 1

## 2020-07-04 MED ORDER — DEXAMETHASONE SODIUM PHOSPHATE 10 MG/ML IJ SOLN
10.0000 mg | Freq: Once | INTRAMUSCULAR | Status: AC
  Administered 2020-07-04: 10 mg via INTRAVENOUS
  Filled 2020-07-04: qty 1

## 2020-07-04 MED ORDER — SODIUM CHLORIDE 0.9 % IV SOLN
1.0000 g | Freq: Once | INTRAVENOUS | Status: AC
  Administered 2020-07-04: 1 g via INTRAVENOUS
  Filled 2020-07-04: qty 10

## 2020-07-04 NOTE — ED Notes (Signed)
Pt back to room from CT

## 2020-07-04 NOTE — ED Notes (Signed)
hospitalist in room at this time

## 2020-07-04 NOTE — H&P (Signed)
History and Physical    Zachary Chambers WUJ:[id number] DOB: 01-17-1951 DOA: 07/04/2020  PCP: [name], MD  Patient coming from: Home  Chief Complaint: "shortness of breath"  HPI: Zachary Chambers is a 69 y.o. male with medical history significant of COPD on 2L home O2 at night, HLD, tobacco abuse. Presenting with 2 weeks of increasing shortness of breath and non-productive cough for just as long. He reports that he was found it more and more difficult to catch his breath with or without activity. He has not tried increasing inhalers or his O2 use. He has not visited his PCP about this problem. He denies F/N/V. He reports sick contact (family with COVID) and reports that he is unvaccinated for [place]. He decided this morning that he was tired of dealing with this and came to the ED.    ED Course: Found to have elevated inflammatory markers. COVID screen pending. CXR w/ possible RLL PNA. Started on rocephin/zithro. Of note, he had a run of a fib RVR and was put on cardizem gtt in ED. He quickly converted to sinus and the gtt was stopped. No previous Hx of a fib per patient. TRH called for admission.   Review of Systems:  Denies CP, syncopal episodes, ab pain. Review of systems is otherwise negative for all not mentioned in HPI.   PMHx Past Medical History:  Diagnosis Date  . COPD (chronic obstructive pulmonary disease) (HCC)    not on home O2  . DJD (degenerative joint disease)    diffusely  . GERD (gastroesophageal reflux disease)   . Hyperlipidemia   . MI (myocardial infarction) ([place]) 2006   small MI and stents x 3  Dr. Doreatha Lew  . PUD (peptic ulcer disease)    with bleeding  . Rotator cuff disorder    has been evaluated by Dr Clifton James and Koleen Distance    PSHx Past Surgical History:  Procedure Laterality Date  . COLONOSCOPY    . EYE SURGERY     right eye removed at age 65  . heart stent     x3- 2006    SocHx  reports that he quit smoking about 76 years ago. His smoking use  included cigarettes. He has a 40.00 pack-year smoking history. He has never used smokeless tobacco. He reports current alcohol use of about 30.0 standard drinks of alcohol per week. He reports previous drug use.  No Known Allergies  FamHx Family History  Problem Relation Age of Onset  . Ovarian cancer Mother   . Diabetes Maternal Grandfather   . COPD Father   . Diabetes Paternal Grandfather   . Colon cancer Paternal Grandfather   . Esophageal cancer Neg Hx   . Rectal cancer Neg Hx   . Stomach cancer Neg Hx     Prior to Admission medications   Medication Sig Start Date End Date Taking? Authorizing Provider  albuterol (PROVENTIL) (2.5 MG/3ML) 0.083% nebulizer solution Take 3 mLs (2.5 mg total) by nebulization every 4 (four) hours as needed for wheezing or shortness of breath. 01/17/19   [place], Fonnie Mu, NP  albuterol (VENTOLIN HFA) 108 (90 Base) MCG/ACT inhaler INHALE 2 PUFFS INTO THE LUNGS EVERY 4 HOURS AS NEEDED FOR WHEEZING AND SHORTNESS OF BREATH 05/27/20   [name], MD  aspirin 81 MG tablet Take 81 mg by mouth at bedtime.     [provider]  atorvastatin (LIPITOR) 40 MG tablet TAKE 1 TABLET BY MOUTH NIGHTLY AT BEDTIME 06/28/20  [place], Modena Nunnery, MD  Budeson-Glycopyrrol-Formoterol (BREZTRI AEROSPHERE) 160-9-4.8 MCG/ACT AERO Inhale 2 puffs into the lungs 2 (two) times daily. 03/12/20   Lauraine Rinne, NP  Budeson-Glycopyrrol-Formoterol (BREZTRI AEROSPHERE) 160-9-4.8 MCG/ACT AERO Inhale 2 puffs into the lungs 2 (two) times daily. 03/12/20   Lauraine Rinne, NP  budesonide-formoterol (SYMBICORT) 160-4.5 MCG/ACT inhaler Inhale 2 puffs into the lungs 2 (two) times daily. 11/01/19   Lauraine Rinne, NP  celecoxib (CELEBREX) 100 MG capsule Take 100 mg by mouth in the morning and at bedtime. 06/03/20   [provider]  fluticasone (FLONASE) 50 MCG/ACT nasal spray USE 2 SPRAYS in [facility] NOSTRIL EVERY DAY 04/17/20   [place], Modena Nunnery, MD  folic acid (FOLVITE) 1 MG tablet TAKE 1  TABLET BY MOUTH EVERY DAY 06/28/20   [place], Modena Nunnery, MD  Glucosamine HCl (GLUCOSAMINE PO) Take 1,500 mg by mouth daily.    [provider]  hydrOXYzine (ATARAX/VISTARIL) 10 MG tablet TAKE 1 TABLET BY MOUTH 3 TIMES DAILY AS NEEDED Patient taking differently: Take 10 mg by mouth as needed for anxiety.  12/22/18   [name], MD  Multiple Vitamin (MULTIVITAMIN WITH MINERALS) TABS tablet Take 1 tablet by mouth daily. 03/09/20   Kayleen Memos, DO  nitroGLYCERIN (NITROSTAT) 0.4 MG SL tablet dissolve 1 TABLET UNDER THE TONGUE every 5 minutes AS NEEDED CHEST PAIN Patient taking differently: Place 0.4 mg under the tongue every 5 (five) minutes as needed for chest pain.  12/05/18   [name], MD  omeprazole (PRILOSEC) 40 MG capsule TAKE 1 CAPSULE BY MOUTH EVERY DAY 06/28/20   [place], Modena Nunnery, MD  predniSONE (DELTASONE) 10 MG tablet 4 tabs for 2 days, then 3 tabs for 2 days, 2 tabs for 2 days, then 1 tab for 2 days, then stop 03/05/20   Lauraine Rinne, NP  tiotropium (SPIRIVA HANDIHALER) 18 MCG inhalation capsule Place 1 capsule (18 mcg total) into inhaler and inhale daily. 02/22/20   Collene Gobble, MD  tiZANidine (ZANAFLEX) 4 MG tablet TAKE 1 TABLET BY MOUTH EVERY EIGHT HOURS AS NEEDED FOR MUSCLE SPASMS Patient taking differently: Take 4 mg by mouth as needed for muscle spasms.  11/27/19   [place], Modena Nunnery, MD  traMADol (ULTRAM) 50 MG tablet TAKE 1 TABLET BY MOUTH EVERY 6 HOURS AS NEEDED FOR moderate PAIN Patient taking differently: Take 50 mg by mouth as needed for moderate pain.  07/07/19   [name], MD    Physical Exam: Vitals:   07/04/20 0900 07/04/20 0930 07/04/20 0945 07/04/20 1000  BP: 90/62 95/63 98/62    Pulse: (!) 156 88 85   Resp: (!) 22 20 (!) 22   Temp:  98.2 F (36.8 C)    TempSrc:      SpO2: 96% 97% 96% 97%  Weight:      Height:        General: 69 y.o. male resting in bed in NAD Eyes: PERRL, normal sclera ENMT: Nares patent w/o discharge, orophaynx  clear, dentition normal, ears w/o discharge/lesions/ulcers Neck: Supple, trachea midline Cardiovascular: RRR, +S1, S2, no m/g/r, equal pulses throughout Respiratory: decreased at bases, exp wheeze b/l, normal WOB on 3L [place] GI: BS+, NDNT, no masses noted, no organomegaly noted MSK: No e/c/c Skin: No rashes, bruises, ulcerations noted Neuro: A&O x 3, no focal deficits Psyc: Appropriate interaction and affect, calm/cooperative  Labs on Admission: I have personally reviewed following labs and imaging studies  CBC: Recent Labs  Lab 07/04/20  0759  WBC 5.5  NEUTROABS 4.1  HGB 15.4  HCT 44.2  MCV 89.8  PLT 102   Basic Metabolic Panel: Recent Labs  Lab 07/04/20 0759  NA 129*  K 3.7  CL 92*  CO2 26  GLUCOSE 128*  BUN 22  CREATININE 0.95  CALCIUM 8.5*   GFR: Estimated Creatinine Clearance: 53.6 mL/min (by C-G formula based on SCr of 0.95 mg/dL). Liver Function Tests: Recent Labs  Lab 07/04/20 0759  AST 55*  ALT 23  ALKPHOS 55  BILITOT 1.1  PROT 7.0  ALBUMIN 3.8   No results for input(s): LIPASE, AMYLASE in the last 168 hours. No results for input(s): AMMONIA in the last 168 hours. Coagulation Profile: No results for input(s): INR, PROTIME in the last 168 hours. Cardiac Enzymes: No results for input(s): CKTOTAL, CKMB, CKMBINDEX, TROPONINI in the last 168 hours. BNP (last 3 results) No results for input(s): PROBNP in the last 8760 hours. HbA1C: No results for input(s): HGBA1C in the last 72 hours. CBG: No results for input(s): GLUCAP in the last 168 hours. Lipid Profile: Recent Labs    07/04/20 0759  TRIG 46   Thyroid Function Tests: No results for input(s): TSH, T4TOTAL, FREET4, T3FREE, THYROIDAB in the last 72 hours. Anemia Panel: Recent Labs    07/04/20 0759  FERRITIN 261   Urine analysis: No results found for: COLORURINE, APPEARANCEUR, LABSPEC, PHURINE, GLUCOSEU, HGBUR, BILIRUBINUR, KETONESUR, PROTEINUR, UROBILINOGEN, NITRITE,  LEUKOCYTESUR  Radiological Exams on Admission: DG Chest Port 1 View  Result Date: 07/04/2020 CLINICAL DATA:  Shortness of breath and weakness. EXAM: PORTABLE CHEST 1 VIEW COMPARISON:  03/06/2020 FINDINGS: Normal sized heart. Coronary artery stent. The lungs remain hyperexpanded. No significant change in pleural fluid or thickening at the right lung base. Interval mild patchy opacity at the right lung base. Stable healing left 8th rib fracture laterally IMPRESSION: 1. Interval mild patchy atelectasis or pneumonia at the right lung base. 2. Stable changes of COPD. Electronically Signed   By: Claudie Revering M.D.   On: 07/04/2020 08:11    EKG: Independently reviewed. Sinus, no ST changes  Assessment/Plan RLL PNA     - admit to observation, telemetry     - currently PUI     - received decadron in ED     - started on rocephin, zithro     - procal is 0.24, repeat in AM; inflammatory markers elevated     - continue steroids; add inhalers, anti-tussives     - CTA PE pending  Acute on chronic hypoxic respiratory failure COPD on 2L home O2     - steroids, inhalers, anti-tussives; O2 support: wean as able  HLD     - continue lipitor  Tobacco abuse EtOH abuse     - says he quit 2 weeks ago when all his symptoms began     - counseled against any further EtOH/tobacco use  Transient a fib     - no previous history     - was on cardizem gtt, but quickly converted     - place in tele for now, monitor  DVT prophylaxis: lovenox  Code Status: Intubation only  Family Communication: Attempted call to [name], no answer  Consults called: None  Status is: Observation  The patient remains OBS appropriate and will d/c before 2 midnights.  Dispo: The patient is from: Home              Anticipated d/c is to: Home  Anticipated d/c date is: 1 day              Patient currently is not medically stable to d/c.  Jonnie Finner DO [facility]  If 7PM-7AM, please contact  night-coverage [email or web address]  07/04/2020, 10:09 AM

## 2020-07-04 NOTE — ED Notes (Signed)
Date and time results received: 07/04/20 1145 (use smartphrase ".now" to insert current time)  Test: covid Critical Value: positive  Name of Provider Notified: butler  Orders Received? Or Actions Taken?:

## 2020-07-04 NOTE — ED Notes (Signed)
Isolation caddy placed on door due to contact with covid positive. Marland Kitchen

## 2020-07-04 NOTE — ED Notes (Signed)
Patient transported to CT 

## 2020-07-04 NOTE — ED Provider Notes (Signed)
[place] DEPT Provider Note   CSN: [id number] Arrival date & time: 07/04/20  0645     History Chief Complaint  Patient presents with  . Cough  . Shortness of Breath    Zachary Chambers is a 69 y.o. male.  He is a very poor historian.  History of COPD and continues to smoke.  States he is on home oxygen but cannot tell me how much.  Complaining of cough and shortness of breath for a few weeks.  Decreased appetite.  Fever unknown how high.  Decreased appetite.  Family members positive for Covid.  Patient is not Covid vaccinated.  EMS states initial sat was 70%.  The history is provided by the patient.  Cough Cough characteristics:  Unable to specify Sputum characteristics:  Unable to specify Severity:  Unable to specify Onset quality:  Gradual Duration:  2 weeks Timing:  Intermittent Progression:  Unchanged Chronicity:  New Smoker: yes   Context: sick contacts   Relieved by:  Nothing Worsened by:  Nothing Ineffective treatments:  None tried Associated symptoms: fever, myalgias, shortness of breath and wheezing   Associated symptoms: no chest pain, no headaches, no rash, no rhinorrhea and no sore throat   Shortness of Breath Associated symptoms: cough, fever and wheezing   Associated symptoms: no chest pain, no headaches, no rash, no sore throat and no vomiting        Past Medical History:  Diagnosis Date  . COPD (chronic obstructive pulmonary disease) (HCC)    not on home O2  . DJD (degenerative joint disease)    diffusely  . GERD (gastroesophageal reflux disease)   . Hyperlipidemia   . MI (myocardial infarction) ([place]) 2006   small MI and stents x 3  Dr. Doreatha Lew  . PUD (peptic ulcer disease)    with bleeding  . Rotator cuff disorder    has been evaluated by Dr Clifton James and Koleen Distance    Patient Active Problem List   Diagnosis Date Noted  . Protein-calorie malnutrition, severe 03/07/2020  . S/P coronary artery stent placement   .  Chest pain 03/06/2020  . Dyslipidemia 03/06/2020  . Healthcare maintenance 03/05/2020  . Heavy alcohol use 03/04/2018  . Memory changes 03/04/2018  . Leukopenia 05/04/2017  . Bruit 01/22/2014  . Muscle spasm of back 09/20/2013  . COPD (chronic obstructive pulmonary disease) ([place]) 01/14/2011  . CAD (coronary artery disease) 01/14/2011  . GERD (gastroesophageal reflux disease) 01/14/2011  . TOBACCO ABUSE 07/25/2009  . Osteoarthritis 07/25/2009  . ROTATOR CUFF SYNDROME, RIGHT 07/25/2009    Past Surgical History:  Procedure Laterality Date  . COLONOSCOPY    . EYE SURGERY     right eye removed at age 7  . heart stent     x3- 2006       Family History  Problem Relation Age of Onset  . Ovarian cancer Mother   . Diabetes Maternal Grandfather   . COPD Father   . Diabetes Paternal Grandfather   . Colon cancer Paternal Grandfather   . Esophageal cancer Neg Hx   . Rectal cancer Neg Hx   . Stomach cancer Neg Hx     Social History   Tobacco Use  . Smoking status: Former Smoker    Packs/day: 1.00    Years: 40.00    Pack years: 40.00    Types: Cigarettes    Quit date: 1945    Years since quitting: 76.9  . Smokeless tobacco: Never Used  .  Tobacco comment: declines patch  Vaping Use  . Vaping Use: Never used  Substance Use Topics  . Alcohol use: Yes    Alcohol/week: 30.0 standard drinks    Types: 30 Cans of beer per week    Comment: h/o heavy use, now 4 beers per day  . Drug use: Not Currently    Home Medications Prior to Admission medications   Medication Sig Start Date End Date Taking? Authorizing Provider  albuterol (PROVENTIL) (2.5 MG/3ML) 0.083% nebulizer solution Take 3 mLs (2.5 mg total) by nebulization every 4 (four) hours as needed for wheezing or shortness of breath. 01/17/19   [place], Fonnie Mu, NP  albuterol (VENTOLIN HFA) 108 (90 Base) MCG/ACT inhaler INHALE 2 PUFFS INTO THE LUNGS EVERY 4 HOURS AS NEEDED FOR WHEEZING AND SHORTNESS OF BREATH 05/27/20   [name], MD  aspirin 81 MG tablet Take 81 mg by mouth at bedtime.     [provider]  atorvastatin (LIPITOR) 40 MG tablet TAKE 1 TABLET BY MOUTH NIGHTLY AT BEDTIME 06/28/20   [place], Modena Nunnery, MD  Budeson-Glycopyrrol-Formoterol (BREZTRI AEROSPHERE) 160-9-4.8 MCG/ACT AERO Inhale 2 puffs into the lungs 2 (two) times daily. 03/12/20   Lauraine Rinne, NP  Budeson-Glycopyrrol-Formoterol (BREZTRI AEROSPHERE) 160-9-4.8 MCG/ACT AERO Inhale 2 puffs into the lungs 2 (two) times daily. 03/12/20   Lauraine Rinne, NP  budesonide-formoterol (SYMBICORT) 160-4.5 MCG/ACT inhaler Inhale 2 puffs into the lungs 2 (two) times daily. 11/01/19   Lauraine Rinne, NP  fluticasone (FLONASE) 50 MCG/ACT nasal spray USE 2 SPRAYS in [facility] NOSTRIL EVERY DAY 04/17/20   [place], Modena Nunnery, MD  folic acid (FOLVITE) 1 MG tablet TAKE 1 TABLET BY MOUTH EVERY DAY 06/28/20   [place], Modena Nunnery, MD  Glucosamine HCl (GLUCOSAMINE PO) Take 1,500 mg by mouth daily.    [provider]  hydrOXYzine (ATARAX/VISTARIL) 10 MG tablet TAKE 1 TABLET BY MOUTH 3 TIMES DAILY AS NEEDED Patient taking differently: Take 10 mg by mouth as needed for anxiety.  12/22/18   [name], MD  Multiple Vitamin (MULTIVITAMIN WITH MINERALS) TABS tablet Take 1 tablet by mouth daily. 03/09/20   Kayleen Memos, DO  nitroGLYCERIN (NITROSTAT) 0.4 MG SL tablet dissolve 1 TABLET UNDER THE TONGUE every 5 minutes AS NEEDED CHEST PAIN Patient taking differently: Place 0.4 mg under the tongue every 5 (five) minutes as needed for chest pain.  12/05/18   [name], MD  omeprazole (PRILOSEC) 40 MG capsule TAKE 1 CAPSULE BY MOUTH EVERY DAY 06/28/20   [place], Modena Nunnery, MD  predniSONE (DELTASONE) 10 MG tablet 4 tabs for 2 days, then 3 tabs for 2 days, 2 tabs for 2 days, then 1 tab for 2 days, then stop 03/05/20   Lauraine Rinne, NP  tiotropium (SPIRIVA HANDIHALER) 18 MCG inhalation capsule Place 1 capsule (18 mcg total) into inhaler and inhale daily. 02/22/20   Collene Gobble, MD  tiZANidine (ZANAFLEX) 4 MG tablet TAKE 1 TABLET BY MOUTH EVERY EIGHT HOURS AS NEEDED FOR MUSCLE SPASMS Patient taking differently: Take 4 mg by mouth as needed for muscle spasms.  11/27/19   [place], Modena Nunnery, MD  traMADol (ULTRAM) 50 MG tablet TAKE 1 TABLET BY MOUTH EVERY 6 HOURS AS NEEDED FOR moderate PAIN Patient taking differently: Take 50 mg by mouth as needed for moderate pain.  07/07/19   [name], MD    Allergies    Patient has no known allergies.  Review of Systems  Review of Systems  Constitutional: Positive for fever.  HENT: Negative for rhinorrhea and sore throat.   Eyes: Negative for visual disturbance.  Respiratory: Positive for cough, shortness of breath and wheezing.   Cardiovascular: Negative for chest pain.  Gastrointestinal: Negative for vomiting.  Genitourinary: Negative for dysuria.  Musculoskeletal: Positive for myalgias.  Skin: Negative for rash.  Neurological: Negative for headaches.    Physical Exam Updated Vital Signs BP 104/66   Pulse 93   Temp 100.2 F (37.9 C) (Oral)   Resp 20   Ht 5\' 8"  (1.727 m)   Wt 53.1 kg   SpO2 99%   BMI 17.79 kg/m   Physical Exam Vitals and nursing note reviewed.  Constitutional:      Appearance: He is well-developed.  HENT:     Head: Normocephalic and atraumatic.  Eyes:     Conjunctiva/sclera: Conjunctivae normal.  Cardiovascular:     Rate and Rhythm: Normal rate and regular rhythm.     Heart sounds: No murmur heard.   Pulmonary:     Effort: Tachypnea and accessory muscle usage present. No respiratory distress.     Breath sounds: Wheezing present.  Abdominal:     Palpations: Abdomen is soft.     Tenderness: There is no abdominal tenderness.  Musculoskeletal:        General: Normal range of motion.     Cervical back: Neck supple.     Right lower leg: No tenderness. No edema.     Left lower leg: No tenderness. No edema.  Skin:    General: Skin is warm and dry.     Capillary Refill:  Capillary refill takes less than 2 seconds.  Neurological:     General: No focal deficit present.     Mental Status: He is alert.     ED Results / Procedures / Treatments   Labs (all labs ordered are listed, but only abnormal results are displayed) Labs Reviewed  RESPIRATORY PANEL BY RT PCR (FLU A&B, COVID) - Abnormal; Notable for the following components:      Result Value   SARS Coronavirus 2 by RT PCR POSITIVE (*)    All other components within normal limits  CBC WITH DIFFERENTIAL/PLATELET - Abnormal; Notable for the following components:   Lymphs Abs 0.3 (*)    Abs Immature Granulocytes 0.09 (*)    All other components within normal limits  COMPREHENSIVE METABOLIC PANEL - Abnormal; Notable for the following components:   Sodium 129 (*)    Chloride 92 (*)    Glucose, Bld 128 (*)    Calcium 8.5 (*)    AST 55 (*)    All other components within normal limits  D-DIMER, QUANTITATIVE (NOT AT [facility]) - Abnormal; Notable for the following components:   D-Dimer, Quant 4.02 (*)    All other components within normal limits  LACTATE DEHYDROGENASE - Abnormal; Notable for the following components:   LDH 331 (*)    All other components within normal limits  FIBRINOGEN - Abnormal; Notable for the following components:   Fibrinogen 605 (*)    All other components within normal limits  C-REACTIVE PROTEIN - Abnormal; Notable for the following components:   CRP 20.4 (*)    All other components within normal limits  COMPREHENSIVE METABOLIC PANEL - Abnormal; Notable for the following components:   Sodium 132 (*)    Glucose, Bld 109 (*)    Calcium 7.8 (*)    Total Protein 5.8 (*)    Albumin 3.1 (*)  AST 56 (*)    All other components within normal limits  C-REACTIVE PROTEIN - Abnormal; Notable for the following components:   CRP 16.4 (*)    All other components within normal limits  D-DIMER, QUANTITATIVE (NOT AT [facility]) - Abnormal; Notable for the following components:   D-Dimer, Quant  1.50 (*)    All other components within normal limits  CULTURE, BLOOD (ROUTINE X 2)  CULTURE, BLOOD (ROUTINE X 2)  BLOOD CULTURE ID PANEL (REFLEXED) - BCID2  LACTIC ACID, PLASMA  PROCALCITONIN  FERRITIN  TRIGLYCERIDES  CBC  FERRITIN  MAGNESIUM  PHOSPHORUS  PROCALCITONIN    EKG EKG Interpretation  Date/Time:  Thursday July 04 2020 09:27:03 EST Ventricular Rate:  90 PR Interval:    QRS Duration: 99 QT Interval:  363 QTC Calculation: 445 R Axis:   -16 Text Interpretation: Sinus rhythm Borderline short PR interval Borderline left axis deviation Low voltage, precordial leads sinus replacing rapid afib on prior today Confirmed by Aletta Edouard [phone]) on 07/04/2020 9:33:50 AM   Radiology CT Angio Chest PE W/Cm &/Or Wo Cm  Result Date: 07/04/2020 CLINICAL DATA:  Cough, shortness of breath and fever. COVID-19 positive. Elevated D-dimer. EXAM: CT ANGIOGRAPHY CHEST WITH CONTRAST TECHNIQUE: Multidetector CT imaging of the chest was performed using the standard protocol during bolus administration of intravenous contrast. Multiplanar CT image reconstructions and MIPs were obtained to evaluate the vascular anatomy. CONTRAST:  150mL OMNIPAQUE IOHEXOL 350 MG/ML SOLN COMPARISON:  Low dose chest CT dated 07/20/2019. Chest radiographs obtained earlier today FINDINGS: Cardiovascular: Satisfactory opacification of the pulmonary arteries to the segmental level. No evidence of pulmonary embolism. Normal heart size. No pericardial effusion. Atheromatous calcifications, including the coronary arteries and aorta. Mediastinum/Nodes: No enlarged mediastinal, hilar, or axillary lymph nodes. Thyroid gland, trachea, and esophagus demonstrate no significant findings. Lungs/Pleura: The lungs are hyperexpanded with stable scarring at the lung bases and posterior upper lobe. Interval patchy interstitial and airspace opacities in the posterolateral right middle lobe and patchy interstitial opacities in the  lateral aspect of the right upper lobe inferiorly. No pleural fluid. No lung nodules. Upper Abdomen: Unremarkable. Musculoskeletal: Thoracic and lower cervical spine degenerative changes and mild scoliosis. Review of the MIP images confirms the above findings. IMPRESSION: 1. No pulmonary emboli. 2. Interval patchy interstitial and airspace opacities in the posterolateral right middle lobe and patchy interstitial opacities in the lateral aspect of the right upper lobe. This is compatible with pneumonia/pneumonitis. 3. Stable changes of COPD with stable scarring. 4. Calcific coronary artery and aortic atherosclerosis. Aortic Atherosclerosis (ICD10-I70.0) and Emphysema (ICD10-J43.9). Electronically Signed   By: Claudie Revering M.D.   On: 07/04/2020 11:44   DG Chest Port 1 View  Result Date: 07/04/2020 CLINICAL DATA:  Shortness of breath and weakness. EXAM: PORTABLE CHEST 1 VIEW COMPARISON:  03/06/2020 FINDINGS: Normal sized heart. Coronary artery stent. The lungs remain hyperexpanded. No significant change in pleural fluid or thickening at the right lung base. Interval mild patchy opacity at the right lung base. Stable healing left 8th rib fracture laterally IMPRESSION: 1. Interval mild patchy atelectasis or pneumonia at the right lung base. 2. Stable changes of COPD. Electronically Signed   By: Claudie Revering M.D.   On: 07/04/2020 08:11    Procedures .Critical Care Performed by: Hayden Rasmussen, MD Authorized by: Hayden Rasmussen, MD   Critical care provider statement:    Critical care time (minutes):  45   Critical care time was exclusive of:  Separately billable procedures and  treating other patients   Critical care was necessary to treat or prevent imminent or life-threatening deterioration of the following conditions:  Cardiac failure and respiratory failure   Critical care was time spent personally by me on the following activities:  Discussions with consultants, evaluation of patient's response to  treatment, examination of patient, ordering and performing treatments and interventions, ordering and review of laboratory studies, ordering and review of radiographic studies, pulse oximetry, re-evaluation of patient's condition, obtaining history from patient or surrogate, review of old charts and development of treatment plan with patient or surrogate   (including critical care time)  Medications Ordered in ED Medications  sodium chloride (PF) 0.9 % injection (  Not Given 07/04/20 1020)  enoxaparin (LOVENOX) injection 40 mg (40 mg Subcutaneous Given 07/05/20 0814)  acetaminophen (TYLENOL) tablet 650 mg (has no administration in time range)    Or  acetaminophen (TYLENOL) suppository 650 mg (has no administration in time range)  ondansetron (ZOFRAN) tablet 4 mg (has no administration in time range)    Or  ondansetron (ZOFRAN) injection 4 mg (has no administration in time range)  guaiFENesin (MUCINEX) 12 hr tablet 600 mg (600 mg Oral Given 07/05/20 0813)  Ipratropium-Albuterol (COMBIVENT) respimat 1 puff (1 puff Inhalation Given 07/05/20 0813)  remdesivir 200 mg in sodium chloride 0.9% 250 mL IVPB (200 mg Intravenous New Bag/Given 07/04/20 1641)    Followed by  remdesivir 100 mg in sodium chloride 0.9 % 100 mL IVPB (100 mg Intravenous New Bag/Given 07/05/20 0818)  dexamethasone (DECADRON) injection 6 mg (6 mg Intravenous Given 07/05/20 0813)  chlorpheniramine-HYDROcodone (TUSSIONEX) 10-8 MG/5ML suspension 5 mL (5 mLs Oral Given 07/04/20 1553)  ascorbic acid (VITAMIN C) tablet 500 mg (500 mg Oral Given 07/05/20 0814)  zinc sulfate capsule 220 mg (220 mg Oral Given 07/05/20 0814)  dexamethasone (DECADRON) injection 10 mg (10 mg Intravenous Given 07/04/20 0828)  cefTRIAXone (ROCEPHIN) 1 g in sodium chloride 0.9 % 100 mL IVPB (0 g Intravenous Stopped 07/04/20 0859)  azithromycin (ZITHROMAX) 500 mg in sodium chloride 0.9 % 250 mL IVPB (0 mg Intravenous Stopped 07/04/20 0929)  diltiazem  (CARDIZEM) 1 mg/mL load via infusion 15 mg (15 mg Intravenous Bolus from Bag 07/04/20 0858)  acetaminophen (TYLENOL) tablet 650 mg (650 mg Oral Given 07/04/20 0848)  sodium chloride 0.9 % bolus 500 mL (0 mLs Intravenous Stopped 07/04/20 0929)  sodium chloride 0.9 % bolus 500 mL (500 mLs Intravenous New Bag/Given 07/04/20 0929)  0.9 %  sodium chloride infusion ( Intravenous New Bag/Given 07/04/20 1544)  iohexol (OMNIPAQUE) 350 MG/ML injection 100 mL (100 mLs Intravenous Contrast Given 07/04/20 1107)    ED Course  I have reviewed the triage vital signs and the nursing notes.  Pertinent labs & imaging results that were available during my care of the patient were reviewed by me and considered in my medical decision making (see chart for details).  Clinical Course as of Jul 05 943  Thu Jul 04, 2020  0740 Chest x-ray interpreted by me as COPD, pneumonia right lower   [MB]  [phone] Patient initially here with heart rate in the 90s.  I was informed by the nurse there is heart rate shot up to 160.  EKG showing A. fib.  Do not see any prior records of same.  Initiated Cardizem drip with improvement in his rate but his pressures become soft.  Ordering another fluid bolus.  He is otherwise awake and alert.   [MB]  0933 Repeat EKG showing sinus with  short PR.  Have asked the nurse to stop his Cardizem drip.  Hospitalist has been paged for admission.   [MB]  [phone] Discussed with [facility] hospitalist Dr. Marylyn Ishihara who asks if we can get a CT angio chest.  He will evaluate the patient for admission.   [MB]    Clinical Course User Index [MB] Hayden Rasmussen, MD   MDM Rules/Calculators/A&P                         Zachary Chambers was evaluated in Emergency Department on 07/04/2020 for the symptoms described in the history of present illness. He was evaluated in the context of the global COVID-19 pandemic, which necessitated consideration that the patient might be at risk for infection with the SARS-CoV-2 virus that  causes COVID-19. Institutional protocols and algorithms that pertain to the evaluation of patients at risk for COVID-19 are in a state of rapid change based on information released by regulatory bodies including the CDC and federal and state organizations. These policies and algorithms were followed during the patient's care in the ED.  This patient complains of cough and shortness of breath; this involves an extensive number of treatment Options and is a complaint that carries with it a high risk of complications and Morbidity. The differential includes Covid, COPD, pneumonia, pneumothorax, PE, arrhythmia, anemia, metabolic derangement  I ordered, reviewed and interpreted labs, which included CBC with normal white count normal hemoglobin, chemistry with low sodium low chloride, elevated glucose, inflammatory markers and D-dimer positive, Covid testing positive I ordered medication IV fluids and IV steroids, diltiazem for rapid A. fib I ordered imaging studies which included chest x-ray and I independently    visualized and interpreted imaging which showed right lower lobe pneumonia  Previous records obtained and reviewed in epic, no recent admissions I consulted [facility] hospitalist Dr. Marylyn Ishihara and discussed lab and imaging findings  Critical Interventions: Management of patient's hypoxia and rapid A. fib  After the interventions stated above, I reevaluated the patient and found patient still to be requiring oxygen and with soft blood pressures.  Will need admission to the hospital for further management of his Covid pneumonia.  CHA2DS2/VAS Stroke Risk Points      N/A >= 2 Points: High Risk  1 - 1.99 Points: Medium Risk  0 Points: Low Risk    Last Change: N/A      This score determines the patient's risk of having a stroke if the  patient has atrial fibrillation.      This score is not applicable to this patient. Components are not  calculated.      Final Clinical Impression(s) / ED  Diagnoses Final diagnoses:  Community acquired pneumonia of right lower lobe of lung  Atrial fibrillation with rapid ventricular response ([place])  Acute respiratory failure with hypoxia ([place])  COVID-19 virus infection    Rx / DC Orders ED Discharge Orders    None       Hayden Rasmussen, MD 07/05/20 [phone]

## 2020-07-04 NOTE — ED Notes (Signed)
MD in room at this time.

## 2020-07-04 NOTE — ED Notes (Signed)
Patient NSR, per MD turn off Cardizem drip

## 2020-07-04 NOTE — ED Triage Notes (Signed)
Cough, shob, fever, weakness, decreased appetite. Father and wife had covid. Hx of copd. Wheezing and rhonchi on arrival. 70% on RA. 3lpm [place] applied and @ 100%

## 2020-07-05 DIAGNOSIS — N179 Acute kidney failure, unspecified: Secondary | ICD-10-CM | POA: Diagnosis present

## 2020-07-05 DIAGNOSIS — J9601 Acute respiratory failure with hypoxia: Secondary | ICD-10-CM | POA: Diagnosis not present

## 2020-07-05 DIAGNOSIS — E785 Hyperlipidemia, unspecified: Secondary | ICD-10-CM | POA: Diagnosis present

## 2020-07-05 DIAGNOSIS — J440 Chronic obstructive pulmonary disease with acute lower respiratory infection: Secondary | ICD-10-CM | POA: Diagnosis present

## 2020-07-05 DIAGNOSIS — R0600 Dyspnea, unspecified: Secondary | ICD-10-CM | POA: Diagnosis present

## 2020-07-05 DIAGNOSIS — I2510 Atherosclerotic heart disease of native coronary artery without angina pectoris: Secondary | ICD-10-CM | POA: Diagnosis present

## 2020-07-05 DIAGNOSIS — Z825 Family history of asthma and other chronic lower respiratory diseases: Secondary | ICD-10-CM | POA: Diagnosis not present

## 2020-07-05 DIAGNOSIS — F17200 Nicotine dependence, unspecified, uncomplicated: Secondary | ICD-10-CM | POA: Diagnosis not present

## 2020-07-05 DIAGNOSIS — Z681 Body mass index (BMI) 19 or less, adult: Secondary | ICD-10-CM | POA: Diagnosis not present

## 2020-07-05 DIAGNOSIS — Z955 Presence of coronary angioplasty implant and graft: Secondary | ICD-10-CM | POA: Diagnosis not present

## 2020-07-05 DIAGNOSIS — Z7982 Long term (current) use of aspirin: Secondary | ICD-10-CM | POA: Diagnosis not present

## 2020-07-05 DIAGNOSIS — J9621 Acute and chronic respiratory failure with hypoxia: Secondary | ICD-10-CM

## 2020-07-05 DIAGNOSIS — J9611 Chronic respiratory failure with hypoxia: Secondary | ICD-10-CM

## 2020-07-05 DIAGNOSIS — U071 COVID-19: Secondary | ICD-10-CM | POA: Diagnosis present

## 2020-07-05 DIAGNOSIS — J189 Pneumonia, unspecified organism: Secondary | ICD-10-CM | POA: Diagnosis not present

## 2020-07-05 DIAGNOSIS — K219 Gastro-esophageal reflux disease without esophagitis: Secondary | ICD-10-CM | POA: Diagnosis present

## 2020-07-05 DIAGNOSIS — J441 Chronic obstructive pulmonary disease with (acute) exacerbation: Secondary | ICD-10-CM | POA: Diagnosis present

## 2020-07-05 DIAGNOSIS — I252 Old myocardial infarction: Secondary | ICD-10-CM | POA: Diagnosis not present

## 2020-07-05 DIAGNOSIS — I4891 Unspecified atrial fibrillation: Secondary | ICD-10-CM | POA: Diagnosis present

## 2020-07-05 DIAGNOSIS — I480 Paroxysmal atrial fibrillation: Secondary | ICD-10-CM

## 2020-07-05 DIAGNOSIS — Z79899 Other long term (current) drug therapy: Secondary | ICD-10-CM | POA: Diagnosis not present

## 2020-07-05 DIAGNOSIS — J1282 Pneumonia due to coronavirus disease 2019: Secondary | ICD-10-CM | POA: Diagnosis present

## 2020-07-05 DIAGNOSIS — J449 Chronic obstructive pulmonary disease, unspecified: Secondary | ICD-10-CM | POA: Diagnosis not present

## 2020-07-05 DIAGNOSIS — J9610 Chronic respiratory failure, unspecified whether with hypoxia or hypercapnia: Secondary | ICD-10-CM

## 2020-07-05 DIAGNOSIS — Z791 Long term (current) use of non-steroidal anti-inflammatories (NSAID): Secondary | ICD-10-CM | POA: Diagnosis not present

## 2020-07-05 DIAGNOSIS — M1990 Unspecified osteoarthritis, unspecified site: Secondary | ICD-10-CM | POA: Diagnosis present

## 2020-07-05 DIAGNOSIS — Z833 Family history of diabetes mellitus: Secondary | ICD-10-CM | POA: Diagnosis not present

## 2020-07-05 DIAGNOSIS — Z87891 Personal history of nicotine dependence: Secondary | ICD-10-CM | POA: Diagnosis not present

## 2020-07-05 DIAGNOSIS — E46 Unspecified protein-calorie malnutrition: Secondary | ICD-10-CM | POA: Diagnosis present

## 2020-07-05 DIAGNOSIS — Z7951 Long term (current) use of inhaled steroids: Secondary | ICD-10-CM | POA: Diagnosis not present

## 2020-07-05 DIAGNOSIS — Z9981 Dependence on supplemental oxygen: Secondary | ICD-10-CM | POA: Diagnosis not present

## 2020-07-05 DIAGNOSIS — M6284 Sarcopenia: Secondary | ICD-10-CM | POA: Diagnosis present

## 2020-07-05 LAB — CBC
HCT: 43.4 % (ref 39.0–52.0)
Hemoglobin: 14.5 g/dL (ref 13.0–17.0)
MCH: 30.8 pg (ref 26.0–34.0)
MCHC: 33.4 g/dL (ref 30.0–36.0)
MCV: 92.1 fL (ref 80.0–100.0)
Platelets: 237 10*3/uL (ref 150–400)
RBC: 4.71 MIL/uL (ref 4.22–5.81)
RDW: 13.6 % (ref 11.5–15.5)
WBC: 5.2 10*3/uL (ref 4.0–10.5)
nRBC: 0 % (ref 0.0–0.2)

## 2020-07-05 LAB — COMPREHENSIVE METABOLIC PANEL
ALT: 23 U/L (ref 0–44)
AST: 56 U/L — ABNORMAL HIGH (ref 15–41)
Albumin: 3.1 g/dL — ABNORMAL LOW (ref 3.5–5.0)
Alkaline Phosphatase: 44 U/L (ref 38–126)
Anion gap: 9 (ref 5–15)
BUN: 19 mg/dL (ref 8–23)
CO2: 22 mmol/L (ref 22–32)
Calcium: 7.8 mg/dL — ABNORMAL LOW (ref 8.9–10.3)
Chloride: 101 mmol/L (ref 98–111)
Creatinine, Ser: 0.65 mg/dL (ref 0.61–1.24)
GFR, Estimated: >60 mL/min (ref >60)
Glucose, Bld: 109 mg/dL — ABNORMAL HIGH (ref 70–99)
Potassium: 3.7 mmol/L (ref 3.5–5.1)
Sodium: 132 mmol/L — ABNORMAL LOW (ref 135–145)
Total Bilirubin: 0.6 mg/dL (ref 0.3–1.2)
Total Protein: 5.8 g/dL — ABNORMAL LOW (ref 6.5–8.1)

## 2020-07-05 LAB — BLOOD CULTURE ID PANEL (REFLEXED) - BCID2

## 2020-07-05 LAB — PROCALCITONIN: Procalcitonin: 0.16 ng/mL

## 2020-07-05 LAB — D-DIMER, QUANTITATIVE: D-Dimer, Quant: 1.5 ug/mL-FEU — ABNORMAL HIGH (ref 0.00–0.50)

## 2020-07-05 LAB — FERRITIN: Ferritin: 240 ng/mL (ref 24–336)

## 2020-07-05 LAB — MAGNESIUM: Magnesium: 1.9 mg/dL (ref 1.7–2.4)

## 2020-07-05 LAB — PHOSPHORUS: Phosphorus: 2.8 mg/dL (ref 2.5–4.6)

## 2020-07-05 LAB — C-REACTIVE PROTEIN: CRP: 16.4 mg/dL — ABNORMAL HIGH (ref <1.0)

## 2020-07-05 MED ORDER — DEXAMETHASONE SODIUM PHOSPHATE 10 MG/ML IJ SOLN
8.0000 mg | Freq: Two times a day (BID) | INTRAMUSCULAR | Status: DC
  Administered 2020-07-05 – 2020-07-07 (×4): 8 mg via INTRAVENOUS
  Filled 2020-07-05 (×4): qty 1

## 2020-07-05 MED ORDER — BARICITINIB 2 MG PO TABS
4.0000 mg | ORAL_TABLET | Freq: Every day | ORAL | Status: DC
  Administered 2020-07-05 – 2020-07-10 (×6): 4 mg via ORAL
  Filled 2020-07-05 (×6): qty 2

## 2020-07-05 NOTE — Progress Notes (Signed)
[facility] PROGRESS NOTE    Progress Note  Zachary Chambers  GDJ:[id number] DOB: Jul 13, 1951 DOA: 07/04/2020 PCP: [name], MD     Brief Narrative:   Zachary Chambers is an 69 y.o. male past medical history significant for COPD on 2 L of oxygen ongoing tobacco abuse comes in with 2 weeks of increased shortness of breath and cough, he is unvaccinated chest x-ray shows possible right lower lobe pneumonia started empirically on Rocephin and azithromycin.  Assessment/Plan:   Acute on chronic respiratory failure with hypoxia (HCC) due to COVID-19 pneumonia SARS-CoV-2 PCR was positive on July 04, 2020.  He was started empirically on IV steroids, IV remdesivir and baricitinib.  Procalcitonin was low yield so antibiotics were discontinued. He is currently requiring 2-4 L of oxygen to keep saturations greater than 90%. Continue Lovenox for DVT prophylaxis continue to follow inflammatory markers. Try to keep the patient prone for at least 16 hours a day if not prone out of bed to chair. Blood culture PCR was positive for staph blood cultures have been ordered 10 2 he has remained afebrile we will continue to monitor.  TOBACCO ABUSE: Counseling.  COPD (chronic obstructive pulmonary disease) ([place]) Noted.  Transient atrial fibrillation (HCC) Now in sinus rhythm.   DVT prophylaxis: Lovenox Family Communication:none Status is: Observation  The patient will require care spanning > 2 midnights and should be moved to inpatient because: Hemodynamically unstable  Dispo: The patient is from: Home              Anticipated d/c is to: Home              Anticipated d/c date is: 2 days              Patient currently is not medically stable to d/c.        Code Status:     Code Status Orders  (From admission, onward)         Start     Ordered   07/04/20 1230  Limited resuscitation (code)  Continuous       Question Answer Comment  In the event of cardiac or respiratory  ARREST: Initiate Code Blue, Call Rapid Response Yes   In the event of cardiac or respiratory ARREST: Perform CPR No   In the event of cardiac or respiratory ARREST: Perform Intubation/Mechanical Ventilation Yes   In the event of cardiac or respiratory ARREST: Use NIPPV/BiPAp only if indicated Yes   In the event of cardiac or respiratory ARREST: Administer ACLS medications if indicated No   In the event of cardiac or respiratory ARREST: Perform Defibrillation or Cardioversion if indicated No   Comments Intubation only; confirmed at bedside with patient.      07/04/20 1229        Code Status History    Date Active Date Inactive Code Status Order ID Comments User Context   03/06/2020 1624 03/08/2020 2158 DNR [id number]  Karmen Bongo, MD ED   Advance Care Planning Activity        IV Access:    Peripheral IV   Procedures and diagnostic studies:   CT Angio Chest PE W/Cm &/Or Wo Cm  Result Date: 07/04/2020 CLINICAL DATA:  Cough, shortness of breath and fever. COVID-19 positive. Elevated D-dimer. EXAM: CT ANGIOGRAPHY CHEST WITH CONTRAST TECHNIQUE: Multidetector CT imaging of the chest was performed using the standard protocol during bolus administration of intravenous contrast. Multiplanar CT image reconstructions and MIPs were obtained to evaluate the  vascular anatomy. CONTRAST:  16mL OMNIPAQUE IOHEXOL 350 MG/ML SOLN COMPARISON:  Low dose chest CT dated 07/20/2019. Chest radiographs obtained earlier today FINDINGS: Cardiovascular: Satisfactory opacification of the pulmonary arteries to the segmental level. No evidence of pulmonary embolism. Normal heart size. No pericardial effusion. Atheromatous calcifications, including the coronary arteries and aorta. Mediastinum/Nodes: No enlarged mediastinal, hilar, or axillary lymph nodes. Thyroid gland, trachea, and esophagus demonstrate no significant findings. Lungs/Pleura: The lungs are hyperexpanded with stable scarring at the lung bases and  posterior upper lobe. Interval patchy interstitial and airspace opacities in the posterolateral right middle lobe and patchy interstitial opacities in the lateral aspect of the right upper lobe inferiorly. No pleural fluid. No lung nodules. Upper Abdomen: Unremarkable. Musculoskeletal: Thoracic and lower cervical spine degenerative changes and mild scoliosis. Review of the MIP images confirms the above findings. IMPRESSION: 1. No pulmonary emboli. 2. Interval patchy interstitial and airspace opacities in the posterolateral right middle lobe and patchy interstitial opacities in the lateral aspect of the right upper lobe. This is compatible with pneumonia/pneumonitis. 3. Stable changes of COPD with stable scarring. 4. Calcific coronary artery and aortic atherosclerosis. Aortic Atherosclerosis (ICD10-I70.0) and Emphysema (ICD10-J43.9). Electronically Signed   By: Claudie Revering M.D.   On: 07/04/2020 11:44   DG Chest Port 1 View  Result Date: 07/04/2020 CLINICAL DATA:  Shortness of breath and weakness. EXAM: PORTABLE CHEST 1 VIEW COMPARISON:  03/06/2020 FINDINGS: Normal sized heart. Coronary artery stent. The lungs remain hyperexpanded. No significant change in pleural fluid or thickening at the right lung base. Interval mild patchy opacity at the right lung base. Stable healing left 8th rib fracture laterally IMPRESSION: 1. Interval mild patchy atelectasis or pneumonia at the right lung base. 2. Stable changes of COPD. Electronically Signed   By: Claudie Revering M.D.   On: 07/04/2020 08:11     Medical Consultants:    None.  Anti-Infectives:   remdesivir  Subjective:    Zachary Chambers he relates he still feels like it is hard to breathe.  Objective:    Vitals:   07/04/20 2015 07/05/20 0015 07/05/20 0517 07/05/20 0800  BP: 111/63 106/60 90/60   Pulse: 68 (!) 56 (!) 54   Resp: 16 16 18    Temp: 97.6 F (36.4 C) 97.6 F (36.4 C) (!) 97.4 F (36.3 C)   TempSrc: Oral Oral Oral   SpO2: 96% 95% 98%  96%  Weight:      Height:       SpO2: 96 % O2 Flow Rate (L/min): 2 L/min   Intake/Output Summary (Last 24 hours) at 07/05/2020 0851 Last data filed at 07/05/2020 0500 Gross per 24 hour  Intake 1566 ml  Output --  Net 1566 ml   Filed Weights   07/04/20 0719  Weight: 53.1 kg    Exam: General exam: In no acute distress. Respiratory system: Good air movement and clear to auscultation. Cardiovascular system: S1 & S2 heard, RRR. No JVD. Gastrointestinal system: Abdomen is nondistended, soft and nontender.  Extremities: No pedal edema. Skin: No rashes, lesions or ulcers Psychiatry: Judgement and insight appear normal. Mood & affect appropriate.  Data Reviewed:    Labs: Basic Metabolic Panel: Recent Labs  Lab 07/04/20 0759 07/05/20 0447  NA 129* 132*  K 3.7 3.7  CL 92* 101  CO2 26 22  GLUCOSE 128* 109*  BUN 22 19  CREATININE 0.95 0.65  CALCIUM 8.5* 7.8*  MG  --  1.9  PHOS  --  2.8  GFR Estimated Creatinine Clearance: 63.6 mL/min (by C-G formula based on SCr of 0.65 mg/dL). Liver Function Tests: Recent Labs  Lab 07/04/20 0759 07/05/20 0447  AST 55* 56*  ALT 23 23  ALKPHOS 55 44  BILITOT 1.1 0.6  PROT 7.0 5.8*  ALBUMIN 3.8 3.1*   No results for input(s): LIPASE, AMYLASE in the last 168 hours. No results for input(s): AMMONIA in the last 168 hours. Coagulation profile No results for input(s): INR, PROTIME in the last 168 hours. COVID-19 Labs  Recent Labs    07/04/20 0759 07/05/20 0447  DDIMER 4.02* 1.50*  FERRITIN 261 240  LDH 331*  --   CRP 20.4* 16.4*    Lab Results  Component Value Date   SARSCOV2NAA POSITIVE (A) 07/04/2020   [place] NEGATIVE 03/06/2020    CBC: Recent Labs  Lab 07/04/20 0759 07/05/20 0447  WBC 5.5 5.2  NEUTROABS 4.1  --   HGB 15.4 14.5  HCT 44.2 43.4  MCV 89.8 92.1  PLT 226 237   Cardiac Enzymes: No results for input(s): CKTOTAL, CKMB, CKMBINDEX, TROPONINI in the last 168 hours. BNP (last 3 results) No  results for input(s): PROBNP in the last 8760 hours. CBG: No results for input(s): GLUCAP in the last 168 hours. D-Dimer: Recent Labs    07/04/20 0759 07/05/20 0447  DDIMER 4.02* 1.50*   Hgb A1c: No results for input(s): HGBA1C in the last 72 hours. Lipid Profile: Recent Labs    07/04/20 0759  TRIG 46   Thyroid function studies: No results for input(s): TSH, T4TOTAL, T3FREE, THYROIDAB in the last 72 hours.  Invalid input(s): FREET3 Anemia work up: Recent Labs    07/04/20 0759 07/05/20 0447  FERRITIN 261 240   Sepsis Labs: Recent Labs  Lab 07/04/20 0759 07/05/20 0447  PROCALCITON 0.21 0.16  WBC 5.5 5.2  LATICACIDVEN 0.9  --    Microbiology Recent Results (from the past 240 hour(s))  Respiratory Panel by RT PCR (Flu A&B, Covid) - Nasopharyngeal Swab     Status: Abnormal   Collection Time: 07/04/20  7:52 AM   Specimen: Nasopharyngeal Swab  Result Value Ref Range Status   SARS Coronavirus 2 by RT PCR POSITIVE (A) NEGATIVE Final    Comment: RESULT CALLED TO, READ BACK BY AND VERIFIED WITH: ED RN AT 1145 ON 07/04/20 BY S.VANHOORNE (NOTE) SARS-CoV-2 target nucleic acids are DETECTED.  SARS-CoV-2 RNA is generally detectable in upper respiratory specimens  during the acute phase of infection. Positive results are indicative of the presence of the identified virus, but do not rule out bacterial infection or co-infection with other pathogens not detected by the test. Clinical correlation with patient history and other diagnostic information is necessary to determine patient infection status. The expected result is Negative.  Fact Sheet for Patients:  PinkCheek.be  Fact Sheet for Healthcare Providers: GravelBags.it  This test is not yet approved or cleared by the [place] FDA and  has been authorized for detection and/or diagnosis of SARS-CoV-2 by FDA under an Emergency Use Authorization (EUA).  This  EUA will remain in effect (meaning this test c an be used) for the duration of  the COVID-19 declaration under Section 564(b)(1) of the Act, 21 U.S.C. section 360bbb-3(b)(1), unless the authorization is terminated or revoked sooner.      Influenza A by PCR NEGATIVE NEGATIVE Final   Influenza B by PCR NEGATIVE NEGATIVE Final    Comment: (NOTE) The Xpert Xpress SARS-CoV-2/FLU/RSV assay is intended as an aid in  the diagnosis of influenza from Nasopharyngeal swab specimens and  should not be used as a sole basis for treatment. Nasal washings and  aspirates are unacceptable for Xpert Xpress SARS-CoV-2/FLU/RSV  testing.  Fact Sheet for Patients: PinkCheek.be  Fact Sheet for Healthcare Providers: GravelBags.it  This test is not yet approved or cleared by the [place] FDA and  has been authorized for detection and/or diagnosis of SARS-CoV-2 by  FDA under an Emergency Use Authorization (EUA). This EUA will remain  in effect (meaning this test can be used) for the duration of the  Covid-19 declaration under Section 564(b)(1) of the Act, 21  U.S.C. section 360bbb-3(b)(1), unless the authorization is  terminated or revoked. Performed at [facility], [place] [address], [place], [place] [address]   Blood Culture (routine x 2)     Status: None (Preliminary result)   Collection Time: 07/04/20  7:59 AM   Specimen: BLOOD  Result Value Ref Range Status   Specimen Description   Final    BLOOD LEFT ANTECUBITAL Performed at [facility] [address] [place], [place] [address]    Special Requests   Final    BOTTLES DRAWN AEROBIC AND ANAEROBIC Blood Culture adequate volume Performed at [facility] [address] [place], [place] [address]    Culture   Final    NO GROWTH < 12 HOURS Performed at [facility] [address], [place], [place] [address]    Report  Status PENDING  Incomplete  Blood Culture (routine x 2)     Status: None (Preliminary result)   Collection Time: 07/04/20  8:45 AM   Specimen: BLOOD  Result Value Ref Range Status   Specimen Description   Final    BLOOD LEFT WRIST Performed at [facility] [address] [place], [place] [address]    Special Requests   Final    BOTTLES DRAWN AEROBIC AND ANAEROBIC Blood Culture adequate volume Performed at [facility] [address] [place], [place] [address]    Culture  Setup Time   Final    AEROBIC BOTTLE ONLY GRAM POSITIVE COCCI Organism ID to follow CRITICAL RESULT CALLED TO, READ BACK BY AND VERIFIED WITH: Shelda Jakes PharmD 8:35 07/05/20 ([email or web address]) Performed at [facility], [address], [place], [place] [address]    Culture PENDING  Incomplete   Report Status PENDING  Incomplete  Blood Culture ID Panel (Reflexed)     Status: Abnormal   Collection Time: 07/04/20  8:45 AM  Result Value Ref Range Status   Enterococcus faecalis NOT DETECTED NOT DETECTED Final   Enterococcus Faecium NOT DETECTED NOT DETECTED Final   Listeria monocytogenes NOT DETECTED NOT DETECTED Final   Staphylococcus species DETECTED (A) NOT DETECTED Final    Comment: CRITICAL RESULT CALLED TO, READ BACK BY AND VERIFIED WITH: Shelda Jakes PharmD 8:35 07/05/20 ([email or web address])    Staphylococcus aureus (BCID) NOT DETECTED NOT DETECTED Final   Staphylococcus epidermidis NOT DETECTED NOT DETECTED Final   Staphylococcus lugdunensis NOT DETECTED NOT DETECTED Final   Streptococcus species NOT DETECTED NOT DETECTED Final   Streptococcus agalactiae NOT DETECTED NOT DETECTED Final   Streptococcus pneumoniae NOT DETECTED NOT DETECTED Final   Streptococcus pyogenes NOT DETECTED NOT DETECTED Final   A.calcoaceticus-baumannii NOT DETECTED NOT DETECTED Final   Bacteroides fragilis NOT DETECTED NOT DETECTED Final   Enterobacterales NOT DETECTED NOT DETECTED Final   Enterobacter  cloacae complex NOT DETECTED NOT  DETECTED Final   Escherichia coli NOT DETECTED NOT DETECTED Final   Klebsiella aerogenes NOT DETECTED NOT DETECTED Final   Klebsiella oxytoca NOT DETECTED NOT DETECTED Final   Klebsiella pneumoniae NOT DETECTED NOT DETECTED Final   Proteus species NOT DETECTED NOT DETECTED Final   Salmonella species NOT DETECTED NOT DETECTED Final   Serratia marcescens NOT DETECTED NOT DETECTED Final   Haemophilus influenzae NOT DETECTED NOT DETECTED Final   Neisseria meningitidis NOT DETECTED NOT DETECTED Final   Pseudomonas aeruginosa NOT DETECTED NOT DETECTED Final   Stenotrophomonas maltophilia NOT DETECTED NOT DETECTED Final   Candida albicans NOT DETECTED NOT DETECTED Final   Candida auris NOT DETECTED NOT DETECTED Final   Candida glabrata NOT DETECTED NOT DETECTED Final   Candida krusei NOT DETECTED NOT DETECTED Final   Candida parapsilosis NOT DETECTED NOT DETECTED Final   Candida tropicalis NOT DETECTED NOT DETECTED Final   Cryptococcus neoformans/gattii NOT DETECTED NOT DETECTED Final    Comment: Performed at [facility], [place] [address] [place], [place] [address]     Medications:   . vitamin C  500 mg Oral Daily  . dexamethasone (DECADRON) injection  6 mg Intravenous Q24H  . enoxaparin (LOVENOX) injection  40 mg Subcutaneous Daily  . guaiFENesin  600 mg Oral BID  . Ipratropium-Albuterol  1 puff Inhalation Q6H  . zinc sulfate  220 mg Oral Daily   Continuous Infusions: . remdesivir 100 mg in NS 100 mL 100 mg (07/05/20 0818)      LOS: 0 days   Charlynne Cousins  [facility]  07/05/2020, 8:51 AM

## 2020-07-05 NOTE — Progress Notes (Signed)
PHARMACY - PHYSICIAN COMMUNICATION CRITICAL VALUE ALERT - BLOOD CULTURE IDENTIFICATION (BCID)  Zachary Chambers is an 69 y.o. male who presented to [facility] on 07/04/2020 with a chief complaint of Covid PNA  Assessment:  BCID + GPC 1/4 aerobic bottle. Staph species. Possible contaminant   Name of physician (or Provider) Contacted: Aileen Fass via [facility]  Current antibiotics: none, on remdesivir/baricitinib for Covid  Changes to prescribed antibiotics recommended: none, hold abx for now per MD   Results for orders placed or performed during the hospital encounter of 07/04/20  Blood Culture ID Panel (Reflexed) (Collected: 07/04/2020  8:45 AM)  Result Value Ref Range   Enterococcus faecalis NOT DETECTED NOT DETECTED   Enterococcus Faecium NOT DETECTED NOT DETECTED   Listeria monocytogenes NOT DETECTED NOT DETECTED   Staphylococcus species DETECTED (A) NOT DETECTED   Staphylococcus aureus (BCID) NOT DETECTED NOT DETECTED   Staphylococcus epidermidis NOT DETECTED NOT DETECTED   Staphylococcus lugdunensis NOT DETECTED NOT DETECTED   Streptococcus species NOT DETECTED NOT DETECTED   Streptococcus agalactiae NOT DETECTED NOT DETECTED   Streptococcus pneumoniae NOT DETECTED NOT DETECTED   Streptococcus pyogenes NOT DETECTED NOT DETECTED   A.calcoaceticus-baumannii NOT DETECTED NOT DETECTED   Bacteroides fragilis NOT DETECTED NOT DETECTED   Enterobacterales NOT DETECTED NOT DETECTED   Enterobacter cloacae complex NOT DETECTED NOT DETECTED   Escherichia coli NOT DETECTED NOT DETECTED   Klebsiella aerogenes NOT DETECTED NOT DETECTED   Klebsiella oxytoca NOT DETECTED NOT DETECTED   Klebsiella pneumoniae NOT DETECTED NOT DETECTED   Proteus species NOT DETECTED NOT DETECTED   Salmonella species NOT DETECTED NOT DETECTED   Serratia marcescens NOT DETECTED NOT DETECTED   Haemophilus influenzae NOT DETECTED NOT DETECTED   Neisseria meningitidis NOT DETECTED NOT DETECTED   Pseudomonas aeruginosa NOT  DETECTED NOT DETECTED   Stenotrophomonas maltophilia NOT DETECTED NOT DETECTED   Candida albicans NOT DETECTED NOT DETECTED   Candida auris NOT DETECTED NOT DETECTED   Candida glabrata NOT DETECTED NOT DETECTED   Candida krusei NOT DETECTED NOT DETECTED   Candida parapsilosis NOT DETECTED NOT DETECTED   Candida tropicalis NOT DETECTED NOT DETECTED   Cryptococcus neoformans/gattii NOT DETECTED NOT DETECTED    Eudelia Bunch, Pharm.D 07/05/2020 9:41 AM

## 2020-07-06 DIAGNOSIS — U071 COVID-19: Secondary | ICD-10-CM | POA: Diagnosis not present

## 2020-07-06 DIAGNOSIS — J189 Pneumonia, unspecified organism: Secondary | ICD-10-CM | POA: Diagnosis not present

## 2020-07-06 DIAGNOSIS — J9621 Acute and chronic respiratory failure with hypoxia: Secondary | ICD-10-CM | POA: Diagnosis not present

## 2020-07-06 DIAGNOSIS — I4891 Unspecified atrial fibrillation: Secondary | ICD-10-CM | POA: Diagnosis not present

## 2020-07-06 LAB — MAGNESIUM: Magnesium: 2.1 mg/dL (ref 1.7–2.4)

## 2020-07-06 LAB — PHOSPHORUS: Phosphorus: 2.8 mg/dL (ref 2.5–4.6)

## 2020-07-06 LAB — PROCALCITONIN: Procalcitonin: <0.1 ng/mL

## 2020-07-06 LAB — CULTURE, BLOOD (ROUTINE X 2): Special Requests: ADEQUATE

## 2020-07-06 LAB — C-REACTIVE PROTEIN: CRP: 9.7 mg/dL — ABNORMAL HIGH (ref <1.0)

## 2020-07-06 LAB — D-DIMER, QUANTITATIVE: D-Dimer, Quant: 0.95 ug/mL-FEU — ABNORMAL HIGH (ref 0.00–0.50)

## 2020-07-06 LAB — FERRITIN: Ferritin: 333 ng/mL (ref 24–336)

## 2020-07-06 NOTE — Progress Notes (Signed)
[facility] PROGRESS NOTE    Progress Note  Zachary Chambers  WTU:[id number] DOB: 1950-09-06 DOA: 07/04/2020 PCP: [name], MD     Brief Narrative:   Zachary Chambers is an 69 y.o. male past medical history significant for COPD on 2 L of oxygen ongoing tobacco abuse comes in with 2 weeks of increased shortness of breath and cough, he is unvaccinated chest x-ray shows possible right lower lobe pneumonia started empirically on Rocephin and azithromycin.  Assessment/Plan:   Acute on chronic respiratory failure with hypoxia (HCC) due to COVID-19 pneumonia SARS-CoV-2 PCR was positive on July 04, 2020.  Patient is still requiring 2 L of oxygen keep saturations greater 93%,out of bed to chair, incentive spirometry and check saturations with ambulation. He is at his home baseline of oxygen needs which is 2 L. Continue Lovenox for DVT prophylaxis. Inflammatory markers are still elevated, continue IV remdesivir steroids and baricitinib ID reflex panel was positive for staph 2 out of 2 blood cultures have remained negative to date. Question a contaminant.  If they remain negative he can probably be discharged tomorrow and get his last dose of remdesivir as an outpatient.  TOBACCO ABUSE: Counseling.  COPD (chronic obstructive pulmonary disease) ([place]) Noted.  Transient atrial fibrillation (HCC) Now in sinus rhythm.   DVT prophylaxis: Lovenox Family Communication:none Status is: Observation  The patient will require care spanning > 2 midnights and should be moved to inpatient because: Hemodynamically unstable  Dispo: The patient is from: Home              Anticipated d/c is to: Home              Anticipated d/c date is: 1 days              Patient currently is not medically stable to d/c.    Code Status:     Code Status Orders  (From admission, onward)         Start     Ordered   07/04/20 1230  Limited resuscitation (code)  Continuous       Question Answer  Comment  In the event of cardiac or respiratory ARREST: Initiate Code Blue, Call Rapid Response Yes   In the event of cardiac or respiratory ARREST: Perform CPR No   In the event of cardiac or respiratory ARREST: Perform Intubation/Mechanical Ventilation Yes   In the event of cardiac or respiratory ARREST: Use NIPPV/BiPAp only if indicated Yes   In the event of cardiac or respiratory ARREST: Administer ACLS medications if indicated No   In the event of cardiac or respiratory ARREST: Perform Defibrillation or Cardioversion if indicated No   Comments Intubation only; confirmed at bedside with patient.      07/04/20 1229        Code Status History    Date Active Date Inactive Code Status Order ID Comments User Context   03/06/2020 1624 03/08/2020 2158 DNR [id number]  Karmen Bongo, MD ED   Advance Care Planning Activity        IV Access:    Peripheral IV   Procedures and diagnostic studies:   CT Angio Chest PE W/Cm &/Or Wo Cm  Result Date: 07/04/2020 CLINICAL DATA:  Cough, shortness of breath and fever. COVID-19 positive. Elevated D-dimer. EXAM: CT ANGIOGRAPHY CHEST WITH CONTRAST TECHNIQUE: Multidetector CT imaging of the chest was performed using the standard protocol during bolus administration of intravenous contrast. Multiplanar CT image reconstructions and MIPs were obtained to  evaluate the vascular anatomy. CONTRAST:  171mL OMNIPAQUE IOHEXOL 350 MG/ML SOLN COMPARISON:  Low dose chest CT dated 07/20/2019. Chest radiographs obtained earlier today FINDINGS: Cardiovascular: Satisfactory opacification of the pulmonary arteries to the segmental level. No evidence of pulmonary embolism. Normal heart size. No pericardial effusion. Atheromatous calcifications, including the coronary arteries and aorta. Mediastinum/Nodes: No enlarged mediastinal, hilar, or axillary lymph nodes. Thyroid gland, trachea, and esophagus demonstrate no significant findings. Lungs/Pleura: The lungs are  hyperexpanded with stable scarring at the lung bases and posterior upper lobe. Interval patchy interstitial and airspace opacities in the posterolateral right middle lobe and patchy interstitial opacities in the lateral aspect of the right upper lobe inferiorly. No pleural fluid. No lung nodules. Upper Abdomen: Unremarkable. Musculoskeletal: Thoracic and lower cervical spine degenerative changes and mild scoliosis. Review of the MIP images confirms the above findings. IMPRESSION: 1. No pulmonary emboli. 2. Interval patchy interstitial and airspace opacities in the posterolateral right middle lobe and patchy interstitial opacities in the lateral aspect of the right upper lobe. This is compatible with pneumonia/pneumonitis. 3. Stable changes of COPD with stable scarring. 4. Calcific coronary artery and aortic atherosclerosis. Aortic Atherosclerosis (ICD10-I70.0) and Emphysema (ICD10-J43.9). Electronically Signed   By: Claudie Revering M.D.   On: 07/04/2020 11:44   DG Chest Port 1 View  Result Date: 07/04/2020 CLINICAL DATA:  Shortness of breath and weakness. EXAM: PORTABLE CHEST 1 VIEW COMPARISON:  03/06/2020 FINDINGS: Normal sized heart. Coronary artery stent. The lungs remain hyperexpanded. No significant change in pleural fluid or thickening at the right lung base. Interval mild patchy opacity at the right lung base. Stable healing left 8th rib fracture laterally IMPRESSION: 1. Interval mild patchy atelectasis or pneumonia at the right lung base. 2. Stable changes of COPD. Electronically Signed   By: Claudie Revering M.D.   On: 07/04/2020 08:11     Medical Consultants:    None.  Anti-Infectives:   remdesivir  Subjective:    Zachary Chambers relates his breathing is better compared to yesterday  Objective:    Vitals:   07/05/20 0800 07/05/20 1329 07/05/20 2055 07/06/20 0418  BP:  116/66 114/70 114/61  Pulse:  77 66 (!) 52  Resp:  16 15 20   Temp:  97.7 F (36.5 C) (!) 97.3 F (36.3 C) (!) 97.4 F  (36.3 C)  TempSrc:  Oral Oral Oral  SpO2: 96% 92% 93% 99%  Weight:      Height:       SpO2: 99 % O2 Flow Rate (L/min): 2 L/min   Intake/Output Summary (Last 24 hours) at 07/06/2020 0713 Last data filed at 07/05/2020 2100 Gross per 24 hour  Intake 220 ml  Output 1250 ml  Net -1030 ml   Filed Weights   07/04/20 0719  Weight: 53.1 kg    Exam: General exam: In no acute distress. Respiratory system: Good air movement and clear to auscultation. Cardiovascular system: S1 & S2 heard, RRR. No JVD. Gastrointestinal system: Abdomen is nondistended, soft and nontender.  Extremities: No pedal edema. Skin: No rashes, lesions or ulcers  Data Reviewed:    Labs: Basic Metabolic Panel: Recent Labs  Lab 07/04/20 0759 07/05/20 0447 07/06/20 0425  NA 129* 132*  --   K 3.7 3.7  --   CL 92* 101  --   CO2 26 22  --   GLUCOSE 128* 109*  --   BUN 22 19  --   CREATININE 0.95 0.65  --   CALCIUM 8.5* 7.8*  --  MG  --  1.9 2.1  PHOS  --  2.8 2.8   GFR Estimated Creatinine Clearance: 63.6 mL/min (by C-G formula based on SCr of 0.65 mg/dL). Liver Function Tests: Recent Labs  Lab 07/04/20 0759 07/05/20 0447  AST 55* 56*  ALT 23 23  ALKPHOS 55 44  BILITOT 1.1 0.6  PROT 7.0 5.8*  ALBUMIN 3.8 3.1*   No results for input(s): LIPASE, AMYLASE in the last 168 hours. No results for input(s): AMMONIA in the last 168 hours. Coagulation profile No results for input(s): INR, PROTIME in the last 168 hours. COVID-19 Labs  Recent Labs    07/04/20 0759 07/05/20 0447 07/06/20 0425  DDIMER 4.02* 1.50* 0.95*  FERRITIN 261 240 333  LDH 331*  --   --   CRP 20.4* 16.4* 9.7*    Lab Results  Component Value Date   SARSCOV2NAA POSITIVE (A) 07/04/2020   [place] NEGATIVE 03/06/2020    CBC: Recent Labs  Lab 07/04/20 0759 07/05/20 0447  WBC 5.5 5.2  NEUTROABS 4.1  --   HGB 15.4 14.5  HCT 44.2 43.4  MCV 89.8 92.1  PLT 226 237   Cardiac Enzymes: No results for input(s):  CKTOTAL, CKMB, CKMBINDEX, TROPONINI in the last 168 hours. BNP (last 3 results) No results for input(s): PROBNP in the last 8760 hours. CBG: No results for input(s): GLUCAP in the last 168 hours. D-Dimer: Recent Labs    07/05/20 0447 07/06/20 0425  DDIMER 1.50* 0.95*   Hgb A1c: No results for input(s): HGBA1C in the last 72 hours. Lipid Profile: Recent Labs    07/04/20 0759  TRIG 46   Thyroid function studies: No results for input(s): TSH, T4TOTAL, T3FREE, THYROIDAB in the last 72 hours.  Invalid input(s): FREET3 Anemia work up: Recent Labs    07/05/20 0447 07/06/20 0425  FERRITIN 240 333   Sepsis Labs: Recent Labs  Lab 07/04/20 0759 07/05/20 0447  PROCALCITON 0.21 0.16  WBC 5.5 5.2  LATICACIDVEN 0.9  --    Microbiology Recent Results (from the past 240 hour(s))  Respiratory Panel by RT PCR (Flu A&B, Covid) - Nasopharyngeal Swab     Status: Abnormal   Collection Time: 07/04/20  7:52 AM   Specimen: Nasopharyngeal Swab  Result Value Ref Range Status   SARS Coronavirus 2 by RT PCR POSITIVE (A) NEGATIVE Final    Comment: RESULT CALLED TO, READ BACK BY AND VERIFIED WITH: ED RN AT 1145 ON 07/04/20 BY S.VANHOORNE (NOTE) SARS-CoV-2 target nucleic acids are DETECTED.  SARS-CoV-2 RNA is generally detectable in upper respiratory specimens  during the acute phase of infection. Positive results are indicative of the presence of the identified virus, but do not rule out bacterial infection or co-infection with other pathogens not detected by the test. Clinical correlation with patient history and other diagnostic information is necessary to determine patient infection status. The expected result is Negative.  Fact Sheet for Patients:  PinkCheek.be  Fact Sheet for Healthcare Providers: GravelBags.it  This test is not yet approved or cleared by the [place] FDA and  has been authorized for detection  and/or diagnosis of SARS-CoV-2 by FDA under an Emergency Use Authorization (EUA).  This EUA will remain in effect (meaning this test c an be used) for the duration of  the COVID-19 declaration under Section 564(b)(1) of the Act, 21 U.S.C. section 360bbb-3(b)(1), unless the authorization is terminated or revoked sooner.      Influenza A by PCR NEGATIVE NEGATIVE Final   Influenza  B by PCR NEGATIVE NEGATIVE Final    Comment: (NOTE) The Xpert Xpress SARS-CoV-2/FLU/RSV assay is intended as an aid in  the diagnosis of influenza from Nasopharyngeal swab specimens and  should not be used as a sole basis for treatment. Nasal washings and  aspirates are unacceptable for Xpert Xpress SARS-CoV-2/FLU/RSV  testing.  Fact Sheet for Patients: PinkCheek.be  Fact Sheet for Healthcare Providers: GravelBags.it  This test is not yet approved or cleared by the [place] FDA and  has been authorized for detection and/or diagnosis of SARS-CoV-2 by  FDA under an Emergency Use Authorization (EUA). This EUA will remain  in effect (meaning this test can be used) for the duration of the  Covid-19 declaration under Section 564(b)(1) of the Act, 21  U.S.C. section 360bbb-3(b)(1), unless the authorization is  terminated or revoked. Performed at [facility], [place] [address] [place], [place] [address]   Blood Culture (routine x 2)     Status: None (Preliminary result)   Collection Time: 07/04/20  7:59 AM   Specimen: BLOOD  Result Value Ref Range Status   Specimen Description   Final    BLOOD LEFT ANTECUBITAL Performed at [facility] [address] [place], [place] [address]    Special Requests   Final    BOTTLES DRAWN AEROBIC AND ANAEROBIC Blood Culture adequate volume Performed at [facility] [address], [place], [place] [address]    Culture   Final    NO GROWTH 1  DAY Performed at [facility], [place] [address], [place], [place] [address]    Report Status PENDING  Incomplete  Blood Culture (routine x 2)     Status: None (Preliminary result)   Collection Time: 07/04/20  8:45 AM   Specimen: BLOOD  Result Value Ref Range Status   Specimen Description   Final    BLOOD LEFT WRIST Performed at [facility] [address], [place], [place] [address]    Special Requests   Final    BOTTLES DRAWN AEROBIC AND ANAEROBIC Blood Culture adequate volume Performed at [facility] [address], [place], [place] [address]    Culture  Setup Time   Final    AEROBIC BOTTLE ONLY GRAM POSITIVE COCCI Organism ID to follow CRITICAL RESULT CALLED TO, READ BACK BY AND VERIFIED WITH: Shelda Jakes PharmD 8:35 07/05/20 ([email or web address])    Culture   Final    NO GROWTH 1 DAY Performed at [facility], [place] [address], [place], [place] [address]    Report Status PENDING  Incomplete  Blood Culture ID Panel (Reflexed)     Status: Abnormal   Collection Time: 07/04/20  8:45 AM  Result Value Ref Range Status   Enterococcus faecalis NOT DETECTED NOT DETECTED Final   Enterococcus Faecium NOT DETECTED NOT DETECTED Final   Listeria monocytogenes NOT DETECTED NOT DETECTED Final   Staphylococcus species DETECTED (A) NOT DETECTED Final    Comment: CRITICAL RESULT CALLED TO, READ BACK BY AND VERIFIED WITH: Shelda Jakes PharmD 8:35 07/05/20 ([email or web address])    Staphylococcus aureus (BCID) NOT DETECTED NOT DETECTED Final   Staphylococcus epidermidis NOT DETECTED NOT DETECTED Final   Staphylococcus lugdunensis NOT DETECTED NOT DETECTED Final   Streptococcus species NOT DETECTED NOT DETECTED Final   Streptococcus agalactiae NOT DETECTED NOT DETECTED Final   Streptococcus pneumoniae NOT DETECTED NOT DETECTED Final   Streptococcus pyogenes NOT DETECTED NOT DETECTED Final   A.calcoaceticus-baumannii NOT  DETECTED NOT DETECTED Final   Bacteroides fragilis  NOT DETECTED NOT DETECTED Final   Enterobacterales NOT DETECTED NOT DETECTED Final   Enterobacter cloacae complex NOT DETECTED NOT DETECTED Final   Escherichia coli NOT DETECTED NOT DETECTED Final   Klebsiella aerogenes NOT DETECTED NOT DETECTED Final   Klebsiella oxytoca NOT DETECTED NOT DETECTED Final   Klebsiella pneumoniae NOT DETECTED NOT DETECTED Final   Proteus species NOT DETECTED NOT DETECTED Final   Salmonella species NOT DETECTED NOT DETECTED Final   Serratia marcescens NOT DETECTED NOT DETECTED Final   Haemophilus influenzae NOT DETECTED NOT DETECTED Final   Neisseria meningitidis NOT DETECTED NOT DETECTED Final   Pseudomonas aeruginosa NOT DETECTED NOT DETECTED Final   Stenotrophomonas maltophilia NOT DETECTED NOT DETECTED Final   Candida albicans NOT DETECTED NOT DETECTED Final   Candida auris NOT DETECTED NOT DETECTED Final   Candida glabrata NOT DETECTED NOT DETECTED Final   Candida krusei NOT DETECTED NOT DETECTED Final   Candida parapsilosis NOT DETECTED NOT DETECTED Final   Candida tropicalis NOT DETECTED NOT DETECTED Final   Cryptococcus neoformans/gattii NOT DETECTED NOT DETECTED Final    Comment: Performed at [facility], [place] [address], [place], [place] [address]     Medications:    vitamin C  500 mg Oral Daily   baricitinib  4 mg Oral Daily   dexamethasone (DECADRON) injection  8 mg Intravenous Q12H   enoxaparin (LOVENOX) injection  40 mg Subcutaneous Daily   guaiFENesin  600 mg Oral BID   Ipratropium-Albuterol  1 puff Inhalation Q6H   zinc sulfate  220 mg Oral Daily   Continuous Infusions:  remdesivir 100 mg in NS 100 mL 100 mg (07/05/20 0818)      LOS: 1 day   Charlynne Cousins  [facility]  07/06/2020, 7:13 AM

## 2020-07-06 NOTE — Plan of Care (Signed)
  Problem: Education: Goal: Knowledge of General Education information will improve Description: Including pain rating scale, medication(s)/side effects and non-pharmacologic comfort measures Outcome: Progressing   Problem: Clinical Measurements: Goal: Ability to maintain clinical measurements within normal limits will improve Outcome: Progressing   Problem: Clinical Measurements: Goal: Diagnostic test results will improve Outcome: Progressing   Problem: Clinical Measurements: Goal: Respiratory complications will improve Outcome: Progressing   Problem: Activity: Goal: Risk for activity intolerance will decrease Outcome: Progressing   Problem: Education: Goal: Knowledge of risk factors and measures for prevention of condition will improve Outcome: Progressing   Problem: Respiratory: Goal: Complications related to the disease process, condition or treatment will be avoided or minimized Outcome: Progressing

## 2020-07-07 DIAGNOSIS — F17200 Nicotine dependence, unspecified, uncomplicated: Secondary | ICD-10-CM | POA: Diagnosis not present

## 2020-07-07 DIAGNOSIS — U071 COVID-19: Secondary | ICD-10-CM | POA: Diagnosis not present

## 2020-07-07 DIAGNOSIS — J449 Chronic obstructive pulmonary disease, unspecified: Secondary | ICD-10-CM

## 2020-07-07 DIAGNOSIS — J9621 Acute and chronic respiratory failure with hypoxia: Secondary | ICD-10-CM | POA: Diagnosis not present

## 2020-07-07 LAB — D-DIMER, QUANTITATIVE: D-Dimer, Quant: 0.79 ug/mL-FEU — ABNORMAL HIGH (ref 0.00–0.50)

## 2020-07-07 LAB — MAGNESIUM: Magnesium: 2.2 mg/dL (ref 1.7–2.4)

## 2020-07-07 LAB — FERRITIN: Ferritin: 343 ng/mL — ABNORMAL HIGH (ref 24–336)

## 2020-07-07 LAB — PHOSPHORUS: Phosphorus: 2.5 mg/dL (ref 2.5–4.6)

## 2020-07-07 LAB — C-REACTIVE PROTEIN: CRP: 6.2 mg/dL — ABNORMAL HIGH (ref <1.0)

## 2020-07-07 MED ORDER — DEXAMETHASONE SODIUM PHOSPHATE 10 MG/ML IJ SOLN
6.0000 mg | INTRAMUSCULAR | Status: DC
  Administered 2020-07-08: 6 mg via INTRAVENOUS
  Filled 2020-07-07 (×2): qty 1

## 2020-07-07 NOTE — Plan of Care (Signed)

## 2020-07-07 NOTE — Progress Notes (Signed)
PROGRESS NOTE  Zachary Chambers  PRF:[id number] DOB: 09/11/1950 DOA: 07/04/2020 PCP: [name], MD   Brief Narrative: Neev OMA Chambers is an 69 y.o. male past medical history significant for COPD on 2 L of oxygen ongoing tobacco abuse comes in with 2 weeks of increased shortness of breath and cough, he is unvaccinated chest x-ray shows possible right lower lobe pneumonia started empirically on Rocephin and azithromycin.  Assessment & Plan: Principal Problem:   Pneumonia due to COVID-19 virus Active Problems:   TOBACCO ABUSE   COPD (chronic obstructive pulmonary disease) (HCC)   Acute on chronic respiratory failure with hypoxia (HCC)   Transient atrial fibrillation (HCC)  Acute on chronic hypoxemic respiratory failure due to covid-19 pneumonia on COPD: SARS-CoV-2 PCR positive on 07/04/2020.  - Wean oxygen as tolerated. Pt not on 24/7 oxygen at home, but has been on it about 2 years and using it more frequently as of late. - Continue remdesivir x5 days (11/18 - 11/22) - Continue steroids x10 days  - Continue baricitinib, due to continued inflammatory marker elevation.  - Encourage OOB, IS, FV, and awake proning if able - Continue airborne, contact precautions for 21 days from positive testing. - Monitor CMP and inflammatory markers - Enoxaparin prophylactic dose.   S. capitis in 1 of 4 blood cultures: Suspected to be contaminant. Monitoring off abx for now. PCT negative.   COPD: No exacerbation currently.  - BDs  Involuntary weight loss, suspected protein calorie malnutrition:  - Dietitian consult - PT/OT consulted. Pt reports he drives, uses walker and/or wheelchair to get around at home where he helps his wife. Unclear to what extent covid has compromised his functional mobility.   Tobacco use:  - Cessation counseling  Transient atrial fibrillation: Reported. Telemetry review this morning reveals only occasional sinus bradycardia.   DVT prophylaxis: Lovenox Code Status:  Partial Family Communication: None at bedside Disposition Plan:  Status is: Inpatient  Remains inpatient appropriate because:Inpatient level of care appropriate due to severity of illness  Dispo: The patient is from: Home              Anticipated d/c is to: Home (pending PT/OT evaluations)              Anticipated d/c date is: 1 day              Patient currently is not medically stable to d/c.  Consultants:   None  Procedures:   None  Antimicrobials:  Remdesivir, ceftriaxone, azithromycin   Subjective: Breathing is better than admission, not getting up much at all. No chest pain or N/V/D, leg swelling.   Objective: Vitals:   07/06/20 1336 07/06/20 2040 07/07/20 0354 07/07/20 1358  BP: 105/70 112/64 123/69 118/79  Pulse: (!) 59 (!) 58 (!) 57 (!) 58  Resp: 14 19 19 14   Temp: 97.6 F (36.4 C) 97.7 F (36.5 C) (!) 97.4 F (36.3 C) 98.1 F (36.7 C)  TempSrc:   Oral   SpO2: (!) 88% 95% 97% 98%  Weight:      Height:        Intake/Output Summary (Last 24 hours) at 07/07/2020 1540 Last data filed at 07/07/2020 0949 Gross per 24 hour  Intake 480 ml  Output 800 ml  Net -320 ml   Filed Weights   07/04/20 0719  Weight: 53.1 kg    Gen: Thin, frail-appearing male in no distress. Pulm: Non-labored breathing supplemental oxygen. Diminished throughout, crackles at bases.  CV: Regular rate  and rhythm. No murmur, rub, or gallop. No JVD, no pedal edema. GI: Abdomen soft, non-tender, non-distended, with normoactive bowel sounds. No organomegaly or masses felt. Ext: Warm, no deformities Skin: Diffusely very dry without rashes, lesions or ulcers Neuro: Alert and oriented. No focal neurological deficits. Psych: Judgement and insight appear normal. Mood & affect appropriate.   Data Reviewed: I have personally reviewed following labs and imaging studies  CBC: Recent Labs  Lab 07/04/20 0759 07/05/20 0447  WBC 5.5 5.2  NEUTROABS 4.1  --   HGB 15.4 14.5  HCT 44.2 43.4    MCV 89.8 92.1  PLT 226 009   Basic Metabolic Panel: Recent Labs  Lab 07/04/20 0759 07/05/20 0447 07/06/20 0425 07/07/20 0349  NA 129* 132*  --   --   K 3.7 3.7  --   --   CL 92* 101  --   --   CO2 26 22  --   --   GLUCOSE 128* 109*  --   --   BUN 22 19  --   --   CREATININE 0.95 0.65  --   --   CALCIUM 8.5* 7.8*  --   --   MG  --  1.9 2.1 2.2  PHOS  --  2.8 2.8 2.5   GFR: Estimated Creatinine Clearance: 63.6 mL/min (by C-G formula based on SCr of 0.65 mg/dL). Liver Function Tests: Recent Labs  Lab 07/04/20 0759 07/05/20 0447  AST 55* 56*  ALT 23 23  ALKPHOS 55 44  BILITOT 1.1 0.6  PROT 7.0 5.8*  ALBUMIN 3.8 3.1*   No results for input(s): LIPASE, AMYLASE in the last 168 hours. No results for input(s): AMMONIA in the last 168 hours. Coagulation Profile: No results for input(s): INR, PROTIME in the last 168 hours. Cardiac Enzymes: No results for input(s): CKTOTAL, CKMB, CKMBINDEX, TROPONINI in the last 168 hours. BNP (last 3 results) No results for input(s): PROBNP in the last 8760 hours. HbA1C: No results for input(s): HGBA1C in the last 72 hours. CBG: No results for input(s): GLUCAP in the last 168 hours. Lipid Profile: No results for input(s): CHOL, HDL, LDLCALC, TRIG, CHOLHDL, LDLDIRECT in the last 72 hours. Thyroid Function Tests: No results for input(s): TSH, T4TOTAL, FREET4, T3FREE, THYROIDAB in the last 72 hours. Anemia Panel: Recent Labs    07/06/20 0425 07/07/20 0349  FERRITIN 333 343*   Urine analysis: No results found for: COLORURINE, APPEARANCEUR, LABSPEC, PHURINE, GLUCOSEU, HGBUR, BILIRUBINUR, KETONESUR, PROTEINUR, UROBILINOGEN, NITRITE, LEUKOCYTESUR Recent Results (from the past 240 hour(s))  Respiratory Panel by RT PCR (Flu A&B, Covid) - Nasopharyngeal Swab     Status: Abnormal   Collection Time: 07/04/20  7:52 AM   Specimen: Nasopharyngeal Swab  Result Value Ref Range Status   SARS Coronavirus 2 by RT PCR POSITIVE (A) NEGATIVE Final     Comment: RESULT CALLED TO, READ BACK BY AND VERIFIED WITH: ED RN AT 3818 ON 07/04/20 BY S.VANHOORNE (NOTE) SARS-CoV-2 target nucleic acids are DETECTED.  SARS-CoV-2 RNA is generally detectable in upper respiratory specimens  during the acute phase of infection. Positive results are indicative of the presence of the identified virus, but do not rule out bacterial infection or co-infection with other pathogens not detected by the test. Clinical correlation with patient history and other diagnostic information is necessary to determine patient infection status. The expected result is Negative.  Fact Sheet for Patients:  PinkCheek.be  Fact Sheet for Healthcare Providers: GravelBags.it  This test is not yet  approved or cleared by the [place] and  has been authorized for detection and/or diagnosis of SARS-CoV-2 by FDA under an Emergency Use Authorization (EUA).  This EUA will remain in effect (meaning this test c an be used) for the duration of  the COVID-19 declaration under Section 564(b)(1) of the Act, 21 U.S.C. section 360bbb-3(b)(1), unless the authorization is terminated or revoked sooner.      Influenza A by PCR NEGATIVE NEGATIVE Final   Influenza B by PCR NEGATIVE NEGATIVE Final    Comment: (NOTE) The Xpert Xpress SARS-CoV-2/FLU/RSV assay is intended as an aid in  the diagnosis of influenza from Nasopharyngeal swab specimens and  should not be used as a sole basis for treatment. Nasal washings and  aspirates are unacceptable for Xpert Xpress SARS-CoV-2/FLU/RSV  testing.  Fact Sheet for Patients: PinkCheek.be  Fact Sheet for Healthcare Providers: GravelBags.it  This test is not yet approved or cleared by the [place] FDA and  has been authorized for detection and/or diagnosis of SARS-CoV-2 by  FDA under an Emergency Use Authorization (EUA).  This EUA will remain  in effect (meaning this test can be used) for the duration of the  Covid-19 declaration under Section 564(b)(1) of the Act, 21  U.S.C. section 360bbb-3(b)(1), unless the authorization is  terminated or revoked. Performed at [facility], [place] [address], [place], [place] [address]   Blood Culture (routine x 2)     Status: None (Preliminary result)   Collection Time: 07/04/20  7:59 AM   Specimen: BLOOD  Result Value Ref Range Status   Specimen Description   Final    BLOOD LEFT ANTECUBITAL Performed at [facility] [address] [place], [place] [address]    Special Requests   Final    BOTTLES DRAWN AEROBIC AND ANAEROBIC Blood Culture adequate volume Performed at [facility] [address] [place], [place] [address]    Culture   Final    NO GROWTH 3 DAYS Performed at [facility], [place] [address], [place], [place] [address]    Report Status PENDING  Incomplete  Blood Culture (routine x 2)     Status: Abnormal   Collection Time: 07/04/20  8:45 AM   Specimen: BLOOD  Result Value Ref Range Status   Specimen Description   Final    BLOOD LEFT WRIST Performed at [facility] [address], [place], [place] [address]    Special Requests   Final    BOTTLES DRAWN AEROBIC AND ANAEROBIC Blood Culture adequate volume Performed at [facility] [address] [place], [place] [address]    Culture  Setup Time   Final    AEROBIC BOTTLE ONLY GRAM POSITIVE COCCI Organism ID to follow CRITICAL RESULT CALLED TO, READ BACK BY AND VERIFIED WITH: Shelda Jakes PharmD 8:35 07/05/20 ([email or web address])    Culture (A)  Final    STAPHYLOCOCCUS CAPITIS THE SIGNIFICANCE OF ISOLATING THIS ORGANISM FROM A SINGLE SET OF BLOOD CULTURES WHEN MULTIPLE SETS ARE DRAWN IS UNCERTAIN. PLEASE NOTIFY THE MICROBIOLOGY DEPARTMENT WITHIN ONE WEEK IF SPECIATION AND SENSITIVITIES ARE REQUIRED. Performed  at [facility], [place] [address] [place],  [address]    Report Status 07/06/2020 FINAL  Final  Blood Culture ID Panel (Reflexed)     Status: Abnormal   Collection Time: 07/04/20  8:45 AM  Result Value Ref Range Status   Enterococcus faecalis NOT DETECTED NOT DETECTED Final  Enterococcus Faecium NOT DETECTED NOT DETECTED Final   Listeria monocytogenes NOT DETECTED NOT DETECTED Final   Staphylococcus species DETECTED (A) NOT DETECTED Final    Comment: CRITICAL RESULT CALLED TO, READ BACK BY AND VERIFIED WITH: Shelda Jakes PharmD 8:35 07/05/20 ([email or web address])    Staphylococcus aureus (BCID) NOT DETECTED NOT DETECTED Final   Staphylococcus epidermidis NOT DETECTED NOT DETECTED Final   Staphylococcus lugdunensis NOT DETECTED NOT DETECTED Final   Streptococcus species NOT DETECTED NOT DETECTED Final   Streptococcus agalactiae NOT DETECTED NOT DETECTED Final   Streptococcus pneumoniae NOT DETECTED NOT DETECTED Final   Streptococcus pyogenes NOT DETECTED NOT DETECTED Final   A.calcoaceticus-baumannii NOT DETECTED NOT DETECTED Final   Bacteroides fragilis NOT DETECTED NOT DETECTED Final   Enterobacterales NOT DETECTED NOT DETECTED Final   Enterobacter cloacae complex NOT DETECTED NOT DETECTED Final   Escherichia coli NOT DETECTED NOT DETECTED Final   Klebsiella aerogenes NOT DETECTED NOT DETECTED Final   Klebsiella oxytoca NOT DETECTED NOT DETECTED Final   Klebsiella pneumoniae NOT DETECTED NOT DETECTED Final   Proteus species NOT DETECTED NOT DETECTED Final   Salmonella species NOT DETECTED NOT DETECTED Final   Serratia marcescens NOT DETECTED NOT DETECTED Final   Haemophilus influenzae NOT DETECTED NOT DETECTED Final   Neisseria meningitidis NOT DETECTED NOT DETECTED Final   Pseudomonas aeruginosa NOT DETECTED NOT DETECTED Final   Stenotrophomonas maltophilia NOT DETECTED NOT DETECTED Final   Candida albicans NOT DETECTED NOT DETECTED Final   Candida auris NOT DETECTED NOT DETECTED  Final   Candida glabrata NOT DETECTED NOT DETECTED Final   Candida krusei NOT DETECTED NOT DETECTED Final   Candida parapsilosis NOT DETECTED NOT DETECTED Final   Candida tropicalis NOT DETECTED NOT DETECTED Final   Cryptococcus neoformans/gattii NOT DETECTED NOT DETECTED Final    Comment: Performed at [facility], [address], [place], [place] [address]      Radiology Studies: No results found.  Scheduled Meds:  vitamin C  500 mg Oral Daily   baricitinib  4 mg Oral Daily   dexamethasone (DECADRON) injection  8 mg Intravenous Q12H   enoxaparin (LOVENOX) injection  40 mg Subcutaneous Daily   guaiFENesin  600 mg Oral BID   Ipratropium-Albuterol  1 puff Inhalation Q6H   zinc sulfate  220 mg Oral Daily   Continuous Infusions:  remdesivir 100 mg in NS 100 mL 100 mg (07/07/20 1030)     LOS: 2 days   Time spent: 25 minutes.  Patrecia Pour, MD [facility] [email or web address] 07/07/2020, 3:40 PM

## 2020-07-08 DIAGNOSIS — J9621 Acute and chronic respiratory failure with hypoxia: Secondary | ICD-10-CM | POA: Diagnosis not present

## 2020-07-08 DIAGNOSIS — U071 COVID-19: Secondary | ICD-10-CM | POA: Diagnosis not present

## 2020-07-08 DIAGNOSIS — J449 Chronic obstructive pulmonary disease, unspecified: Secondary | ICD-10-CM | POA: Diagnosis not present

## 2020-07-08 DIAGNOSIS — F17200 Nicotine dependence, unspecified, uncomplicated: Secondary | ICD-10-CM | POA: Diagnosis not present

## 2020-07-08 LAB — COMPREHENSIVE METABOLIC PANEL
ALT: 28 U/L (ref 0–44)
AST: 42 U/L — ABNORMAL HIGH (ref 15–41)
Albumin: 3.1 g/dL — ABNORMAL LOW (ref 3.5–5.0)
Alkaline Phosphatase: 42 U/L (ref 38–126)
Anion gap: 9 (ref 5–15)
BUN: 17 mg/dL (ref 8–23)
CO2: 27 mmol/L (ref 22–32)
Calcium: 8.2 mg/dL — ABNORMAL LOW (ref 8.9–10.3)
Chloride: 95 mmol/L — ABNORMAL LOW (ref 98–111)
Creatinine, Ser: 0.57 mg/dL — ABNORMAL LOW (ref 0.61–1.24)
GFR, Estimated: >60 mL/min (ref >60)
Glucose, Bld: 102 mg/dL — ABNORMAL HIGH (ref 70–99)
Potassium: 3.5 mmol/L (ref 3.5–5.1)
Sodium: 131 mmol/L — ABNORMAL LOW (ref 135–145)
Total Bilirubin: 1 mg/dL (ref 0.3–1.2)
Total Protein: 5.8 g/dL — ABNORMAL LOW (ref 6.5–8.1)

## 2020-07-08 LAB — C-REACTIVE PROTEIN: CRP: 4.2 mg/dL — ABNORMAL HIGH (ref <1.0)

## 2020-07-08 MED ORDER — ALBUTEROL SULFATE HFA 108 (90 BASE) MCG/ACT IN AERS: 2.0000 | INHALATION_SPRAY | RESPIRATORY_TRACT | Status: DC | PRN

## 2020-07-08 MED ORDER — METHYLPREDNISOLONE SODIUM SUCC 40 MG IJ SOLR
40.0000 mg | Freq: Two times a day (BID) | INTRAMUSCULAR | Status: DC
  Administered 2020-07-08 – 2020-07-10 (×5): 40 mg via INTRAVENOUS
  Filled 2020-07-08 (×5): qty 1

## 2020-07-08 NOTE — Evaluation (Signed)
Physical Therapy Evaluation Patient Details Name: Zachary Chambers MRN: [id number] DOB: September 02, 1950 Today's Date: 07/08/2020   History of Present Illness  Zachary Chambers is an 69 y.o. male past medical history significant for COPD on 2 L of oxygen ongoing tobacco abuse comes in with 2 weeks of increased shortness of breath and cough, he is unvaccinated chest x-ray shows possible right lower lobe pneumonia started empirically on Rocephin and azithromycin.  Clinical Impression  The patient resting in bed, no readings on sat monitor, on 2 L. Placed  Sensor on right ear with SPO2 95%, HR 65. Sensor did not stay in place, placed another one on  Left finger with readings of 88%. Patient required much encouragement to mobilize. Patient repeatedly stating that he wants to go home. Patient stood x 2, transferred to recliner on 2 L  with SPO2 down to 85%. Encouraged to perform IS x 5 with an increase in SPO2 to90%.   Pt admitted with above diagnosis.  Pt currently with functional limitations due to the deficits listed below (see PT Problem List). Pt will benefit from skilled PT to increase their independence and safety with mobility to allow discharge to the venue listed below.       Follow Up Recommendations Home health PT    Equipment Recommendations  None recommended by PT    Recommendations for Other Services       Precautions / Restrictions Precautions Precautions: Fall Precaution Comments: monitor sats, on home O2      Mobility  Bed Mobility Overal bed mobility: Needs Assistance Bed Mobility: Supine to Sit     Supine to sit: Supervision;HOB elevated     General bed mobility comments: much encouragement and extra time to initiate and sit up on bed edge.    Transfers Overall transfer level: Needs assistance Equipment used: 1 person hand held assist Transfers: Sit to/from [facility] Sit to Stand: Min assist Stand pivot transfers: Min assist       General transfer  comment: min steady assistance to stnad, reaches for recloiner to pivot. Stood from Psychologist, occupational  , held [employer] bed rail to wash up briefly with left hand.  Ambulation/Gait             General Gait Details: NT  Stairs            Wheelchair Mobility    Modified Rankin (Stroke Patients Only)       Balance Overall balance assessment: Needs assistance Sitting-balance support: No upper extremity supported;Feet supported Sitting balance-Leahy Scale: Good Sitting balance - Comments: prop sits in recliner   Standing balance support: Single extremity supported;During functional activity Standing balance-Leahy Scale: Fair Standing balance comment: only stood for ` 15 secs                             Pertinent Vitals/Pain Pain Assessment: Faces Faces Pain Scale: No hurt    Home Living Family/patient expects to be discharged to:: Private residence Living Arrangements: Spouse/significant other Available Help at Discharge: Family Type of Home: House Home Access: Ramped entrance     Home Layout: One level Home Equipment: Clinical cytogeneticist - 2 wheels;Wheelchair - manual      Prior Function Level of Independence: Independent               Hand Dominance   Dominant Hand: Right    Extremity/Trunk Assessment   Upper Extremity Assessment Upper Extremity Assessment: Defer to OT  evaluation    Lower Extremity Assessment Lower Extremity Assessment: Generalized weakness    Cervical / Trunk Assessment Cervical / Trunk Assessment: Kyphotic  Communication   Communication: No difficulties  Cognition Arousal/Alertness: Awake/alert Behavior During Therapy: WFL for tasks assessed/performed;Flat affect Overall Cognitive Status: No family/caregiver present to determine baseline cognitive functioning Area of Impairment: Orientation                 Orientation Level: Time                    General Comments      Exercises Other  Exercises Other Exercises: IS   Assessment/Plan    PT Assessment Patient needs continued PT services  PT Problem List Decreased strength;Decreased knowledge of use of DME;Decreased activity tolerance;Decreased knowledge of precautions;Decreased mobility;Cardiopulmonary status limiting activity       PT Treatment Interventions DME instruction;Gait training;Functional mobility training;Therapeutic activities;Therapeutic exercise;Patient/family education    PT Goals (Current goals can be found in the Care Plan section)  Acute Rehab PT Goals Patient Stated Goal: to go today PT Goal Formulation: With patient Time For Goal Achievement: 07/22/20 Potential to Achieve Goals: Fair    Frequency Min 3X/week   Barriers to discharge        Co-evaluation               AM-PAC PT "6 Clicks" Mobility  Outcome Measure Help needed turning from your back to your side while in a flat bed without using bedrails?: A Little Help needed moving from lying on your back to sitting on the side of a flat bed without using bedrails?: A Little Help needed moving to and from a bed to a chair (including a wheelchair)?: A Little Help needed standing up from a chair using your arms (e.g., wheelchair or bedside chair)?: A Little Help needed to walk in hospital room?: A Lot Help needed climbing 3-5 steps with a railing? : A Lot 6 Click Score: 16    End of Session Equipment Utilized During Treatment: Oxygen Activity Tolerance: Patient limited by fatigue Patient left: in chair;with call bell/phone within reach;with chair alarm set Nurse Communication: Mobility status PT Visit Diagnosis: Unsteadiness on feet (R26.81);Difficulty in walking, not elsewhere classified (R26.2)    Time: 4709-2957 PT Time Calculation (min) (ACUTE ONLY): 55 min   Charges:   PT Evaluation $PT Eval Low Complexity: 1 Low PT Treatments $Therapeutic Exercise: 8-22 mins $Therapeutic Activity: 23-37 mins        Tresa Endo  PT Acute Rehabilitation Services Pager [phone] Office [phone]   Claretha Cooper 07/08/2020, 12:53 PM

## 2020-07-08 NOTE — Progress Notes (Signed)
OT Cancellation Note  Patient Details Name: Zachary Chambers MRN: [id number] DOB: 28-Mar-1951   Cancelled Treatment:    Reason Eval/Treat Not Completed: Medical issues which prohibited therapy: Nursing hold this morning due to pt desaturating to 85 at rest and pt reporting feeling as if he cannot breath. Will continue efforts as able.   Julien Girt 07/08/2020, 9:42 AM

## 2020-07-08 NOTE — Care Management Important Message (Signed)
Important Message  Patient Details IM Letter given to the Patient. Name: Zachary Chambers MRN: [id number] Date of Birth: Oct 28, 1950   Medicare Important Message Given:  Yes     Kerin Salen 07/08/2020, 10:54 AM

## 2020-07-08 NOTE — Progress Notes (Signed)
PROGRESS NOTE  Zachary Chambers  HGD:[id number] DOB: 11-May-1951 DOA: 07/04/2020 PCP: [name], MD   Brief Narrative: Zachary Chambers is an 69 y.o. male past medical history significant for COPD on 2 L of oxygen ongoing tobacco abuse comes in with 2 weeks of increased shortness of breath and cough, he is unvaccinated chest x-ray shows possible right lower lobe pneumonia started empirically on Rocephin and azithromycin.  Assessment & Plan: Principal Problem:   Pneumonia due to COVID-19 virus Active Problems:   TOBACCO ABUSE   COPD (chronic obstructive pulmonary disease) (HCC)   Acute on chronic respiratory failure with hypoxia (HCC)   Transient atrial fibrillation (HCC)  Acute on chronic hypoxemic respiratory failure due to covid-19 pneumonia on COPD: SARS-CoV-2 PCR positive on 07/04/2020.  - Wean oxygen as tolerated. Pt not on 24/7 oxygen at home, but has been on it about 2 years and using it more frequently as of late. Hypoxia continues to be severe, limiting ability to mobilize and ability to discharge. - Complete remdesivir x5 days (11/18 - 11/22) - Continue steroids x10 days, augmenting as below. CRP remains >4.  - Continue baricitinib, due to continued inflammatory marker elevation.  - Encourage OOB, IS, FV, and awake proning if able - Continue airborne, contact precautions for 21 days from positive testing. - Monitor CMP and inflammatory markers - Enoxaparin prophylactic dose.   S. capitis in 1 of 4 blood cultures: Suspected to be contaminant. Monitoring off abx for now. PCT negative.   COPD with acute exacerbation: Having some wheezing, increased dyspnea, prolonged expiration.  - Increase steroids - Continue bronchodilators combivent and add prn albuterol.  Involuntary weight loss, suspected protein calorie malnutrition:  - Dietitian consult - PT/OT consulted, SpO2 low and pt tachypneic when attempted to work with him earlier. Pt reports he drives, uses walker and/or  wheelchair to get around at home where he helps his wife. Unclear to what extent covid has compromised his functional mobility.   AKI: With sarcopenia, SCr baseline is lower than normal limits, has improved from 0.96 >> 0.57 with treatment. - Avoid nephrotoxins.  Tobacco use:  - Cessation counseling  Transient atrial fibrillation: Reported. Telemetry review this morning reveals only occasional sinus bradycardia.   DVT prophylaxis: Lovenox Code Status: Partial Family Communication: None at bedside Disposition Plan:  Status is: Inpatient  Remains inpatient appropriate because:Inpatient level of care appropriate due to severity of illness  Dispo: The patient is from: Home              Anticipated d/c is to: Home (pending PT/OT evaluations)              Anticipated d/c date is: 2 days              Patient currently is not medically stable to d/c.  Consultants:   None  Procedures:   None  Antimicrobials:  Remdesivir, ceftriaxone, azithromycin   Subjective: Desperately wants to go home, but is not surprised he needs to stay for now. He reports current moderate hypoxia at rest that is constant, gradually improving after recently moving around in bed. No current chest pain or leg swelling.   Objective: Vitals:   07/07/20 0354 07/07/20 1358 07/07/20 2037 07/08/20 0433  BP: 123/69 118/79 134/78 125/71  Pulse: (!) 57 (!) 58 (!) 57 (!) 50  Resp: 19 14 17 19   Temp: (!) 97.4 F (36.3 C) 98.1 F (36.7 C) 98 F (36.7 C) 97.8 F (36.6 C)  TempSrc: Oral  Oral  SpO2: 97% 98% 94% 96%  Weight:      Height:        Intake/Output Summary (Last 24 hours) at 07/08/2020 1118 Last data filed at 07/07/2020 1700 Gross per 24 hour  Intake 0 ml  Output --  Net 0 ml   Filed Weights   07/04/20 0719  Weight: 53.1 kg   Gen: 69 y.o. male in no distress Pulm: Mildly labored with supplemental oxygen. Poor pleth, though SpO2 on continuous monitor hovers in 70%'s up to 92%, usually in  80%'s at rest. + prolonged expiration with mild end-expiratory wheeze. CV: Regular rate and rhythm. No murmur, rub, or gallop. No JVD, no dependent edema. GI: Abdomen soft, non-tender, non-distended, with normoactive bowel sounds.  Ext: Warm, no deformities, decreased muscle bulk. Skin: No rashes, lesions or ulcers on visualized skin. Neuro: Alert and oriented. No focal neurological deficits. Psych: Judgement and insight appear fair. Mood euthymic & affect congruent. Behavior is appropriate.    Data Reviewed: I have personally reviewed following labs and imaging studies  CBC: Recent Labs  Lab 07/04/20 0759 07/05/20 0447  WBC 5.5 5.2  NEUTROABS 4.1  --   HGB 15.4 14.5  HCT 44.2 43.4  MCV 89.8 92.1  PLT 226 470   Basic Metabolic Panel: Recent Labs  Lab 07/04/20 0759 07/05/20 0447 07/06/20 0425 07/07/20 0349 07/08/20 0405  NA 129* 132*  --   --  131*  K 3.7 3.7  --   --  3.5  CL 92* 101  --   --  95*  CO2 26 22  --   --  27  GLUCOSE 128* 109*  --   --  102*  BUN 22 19  --   --  17  CREATININE 0.95 0.65  --   --  0.57*  CALCIUM 8.5* 7.8*  --   --  8.2*  MG  --  1.9 2.1 2.2  --   PHOS  --  2.8 2.8 2.5  --    GFR: Estimated Creatinine Clearance: 63.6 mL/min (A) (by C-G formula based on SCr of 0.57 mg/dL (L)). Liver Function Tests: Recent Labs  Lab 07/04/20 0759 07/05/20 0447 07/08/20 0405  AST 55* 56* 42*  ALT 23 23 28   ALKPHOS 55 44 42  BILITOT 1.1 0.6 1.0  PROT 7.0 5.8* 5.8*  ALBUMIN 3.8 3.1* 3.1*   No results for input(s): LIPASE, AMYLASE in the last 168 hours. No results for input(s): AMMONIA in the last 168 hours. Coagulation Profile: No results for input(s): INR, PROTIME in the last 168 hours. Cardiac Enzymes: No results for input(s): CKTOTAL, CKMB, CKMBINDEX, TROPONINI in the last 168 hours. BNP (last 3 results) No results for input(s): PROBNP in the last 8760 hours. HbA1C: No results for input(s): HGBA1C in the last 72 hours. CBG: No results for  input(s): GLUCAP in the last 168 hours. Lipid Profile: No results for input(s): CHOL, HDL, LDLCALC, TRIG, CHOLHDL, LDLDIRECT in the last 72 hours. Thyroid Function Tests: No results for input(s): TSH, T4TOTAL, FREET4, T3FREE, THYROIDAB in the last 72 hours. Anemia Panel: Recent Labs    07/06/20 0425 07/07/20 0349  FERRITIN 333 343*   Urine analysis: No results found for: COLORURINE, APPEARANCEUR, LABSPEC, PHURINE, GLUCOSEU, HGBUR, BILIRUBINUR, KETONESUR, PROTEINUR, UROBILINOGEN, NITRITE, LEUKOCYTESUR Recent Results (from the past 240 hour(s))  Respiratory Panel by RT PCR (Flu A&B, Covid) - Nasopharyngeal Swab     Status: Abnormal   Collection Time: 07/04/20  7:52 AM  Specimen: Nasopharyngeal Swab  Result Value Ref Range Status   SARS Coronavirus 2 by RT PCR POSITIVE (A) NEGATIVE Final    Comment: RESULT CALLED TO, READ BACK BY AND VERIFIED WITH: ED RN AT 3474 ON 07/04/20 BY S.VANHOORNE (NOTE) SARS-CoV-2 target nucleic acids are DETECTED.  SARS-CoV-2 RNA is generally detectable in upper respiratory specimens  during the acute phase of infection. Positive results are indicative of the presence of the identified virus, but do not rule out bacterial infection or co-infection with other pathogens not detected by the test. Clinical correlation with patient history and other diagnostic information is necessary to determine patient infection status. The expected result is Negative.  Fact Sheet for Patients:  PinkCheek.be  Fact Sheet for Healthcare Providers: GravelBags.it  This test is not yet approved or cleared by the [place] FDA and  has been authorized for detection and/or diagnosis of SARS-CoV-2 by FDA under an Emergency Use Authorization (EUA).  This EUA will remain in effect (meaning this test c an be used) for the duration of  the COVID-19 declaration under Section 564(b)(1) of the Act, 21 U.S.C. section  360bbb-3(b)(1), unless the authorization is terminated or revoked sooner.      Influenza A by PCR NEGATIVE NEGATIVE Final   Influenza B by PCR NEGATIVE NEGATIVE Final    Comment: (NOTE) The Xpert Xpress SARS-CoV-2/FLU/RSV assay is intended as an aid in  the diagnosis of influenza from Nasopharyngeal swab specimens and  should not be used as a sole basis for treatment. Nasal washings and  aspirates are unacceptable for Xpert Xpress SARS-CoV-2/FLU/RSV  testing.  Fact Sheet for Patients: PinkCheek.be  Fact Sheet for Healthcare Providers: GravelBags.it  This test is not yet approved or cleared by the [place] FDA and  has been authorized for detection and/or diagnosis of SARS-CoV-2 by  FDA under an Emergency Use Authorization (EUA). This EUA will remain  in effect (meaning this test can be used) for the duration of the  Covid-19 declaration under Section 564(b)(1) of the Act, 21  U.S.C. section 360bbb-3(b)(1), unless the authorization is  terminated or revoked. Performed at [facility], [place] [address], [place], [place] [address]   Blood Culture (routine x 2)     Status: None (Preliminary result)   Collection Time: 07/04/20  7:59 AM   Specimen: BLOOD  Result Value Ref Range Status   Specimen Description   Final    BLOOD LEFT ANTECUBITAL Performed at [facility] [address] [place], [place] [address]    Special Requests   Final    BOTTLES DRAWN AEROBIC AND ANAEROBIC Blood Culture adequate volume Performed at [facility] [address], [place], [place] [address]    Culture   Final    NO GROWTH 4 DAYS Performed at [facility], [place] [address] [place], [place] [address]    Report Status PENDING  Incomplete  Blood Culture (routine x 2)     Status: Abnormal   Collection Time: 07/04/20  8:45 AM   Specimen: BLOOD  Result Value Ref Range Status    Specimen Description   Final    BLOOD LEFT WRIST Performed at [facility] [address], [place], [place] [address]    Special Requests   Final    BOTTLES DRAWN AEROBIC AND ANAEROBIC Blood Culture adequate volume Performed at [facility] [address] [place], [place] [address]    Culture  Setup Time  Final    AEROBIC BOTTLE ONLY GRAM POSITIVE COCCI Organism ID to follow CRITICAL RESULT CALLED TO, READ BACK BY AND VERIFIED WITH: Shelda Jakes PharmD 8:35 07/05/20 ([email or web address])    Culture (A)  Final    STAPHYLOCOCCUS CAPITIS THE SIGNIFICANCE OF ISOLATING THIS ORGANISM FROM A SINGLE SET OF BLOOD CULTURES WHEN MULTIPLE SETS ARE DRAWN IS UNCERTAIN. PLEASE NOTIFY THE MICROBIOLOGY DEPARTMENT WITHIN ONE WEEK IF SPECIATION AND SENSITIVITIES ARE REQUIRED. Performed at [facility], [place] [address], [place], [place] [address]    Report Status 07/06/2020 FINAL  Final  Blood Culture ID Panel (Reflexed)     Status: Abnormal   Collection Time: 07/04/20  8:45 AM  Result Value Ref Range Status   Enterococcus faecalis NOT DETECTED NOT DETECTED Final   Enterococcus Faecium NOT DETECTED NOT DETECTED Final   Listeria monocytogenes NOT DETECTED NOT DETECTED Final   Staphylococcus species DETECTED (A) NOT DETECTED Final    Comment: CRITICAL RESULT CALLED TO, READ BACK BY AND VERIFIED WITH: Shelda Jakes PharmD 8:35 07/05/20 ([email or web address])    Staphylococcus aureus (BCID) NOT DETECTED NOT DETECTED Final   Staphylococcus epidermidis NOT DETECTED NOT DETECTED Final   Staphylococcus lugdunensis NOT DETECTED NOT DETECTED Final   Streptococcus species NOT DETECTED NOT DETECTED Final   Streptococcus agalactiae NOT DETECTED NOT DETECTED Final   Streptococcus pneumoniae NOT DETECTED NOT DETECTED Final   Streptococcus pyogenes NOT DETECTED NOT DETECTED Final   A.calcoaceticus-baumannii NOT DETECTED NOT DETECTED Final   Bacteroides fragilis NOT DETECTED NOT DETECTED Final    Enterobacterales NOT DETECTED NOT DETECTED Final   Enterobacter cloacae complex NOT DETECTED NOT DETECTED Final   Escherichia coli NOT DETECTED NOT DETECTED Final   Klebsiella aerogenes NOT DETECTED NOT DETECTED Final   Klebsiella oxytoca NOT DETECTED NOT DETECTED Final   Klebsiella pneumoniae NOT DETECTED NOT DETECTED Final   Proteus species NOT DETECTED NOT DETECTED Final   Salmonella species NOT DETECTED NOT DETECTED Final   Serratia marcescens NOT DETECTED NOT DETECTED Final   Haemophilus influenzae NOT DETECTED NOT DETECTED Final   Neisseria meningitidis NOT DETECTED NOT DETECTED Final   Pseudomonas aeruginosa NOT DETECTED NOT DETECTED Final   Stenotrophomonas maltophilia NOT DETECTED NOT DETECTED Final   Candida albicans NOT DETECTED NOT DETECTED Final   Candida auris NOT DETECTED NOT DETECTED Final   Candida glabrata NOT DETECTED NOT DETECTED Final   Candida krusei NOT DETECTED NOT DETECTED Final   Candida parapsilosis NOT DETECTED NOT DETECTED Final   Candida tropicalis NOT DETECTED NOT DETECTED Final   Cryptococcus neoformans/gattii NOT DETECTED NOT DETECTED Final    Comment: Performed at [facility], [address], [place], [place] [address]      Radiology Studies: No results found.  Scheduled Meds:  vitamin C  500 mg Oral Daily   baricitinib  4 mg Oral Daily   dexamethasone (DECADRON) injection  6 mg Intravenous Q24H   enoxaparin (LOVENOX) injection  40 mg Subcutaneous Daily   guaiFENesin  600 mg Oral BID   Ipratropium-Albuterol  1 puff Inhalation Q6H   zinc sulfate  220 mg Oral Daily   Continuous Infusions:    LOS: 3 days   Time spent: 35 minutes.  Patrecia Pour, MD [facility] [email or web address] 07/08/2020, 11:18 AM

## 2020-07-09 DIAGNOSIS — F17200 Nicotine dependence, unspecified, uncomplicated: Secondary | ICD-10-CM | POA: Diagnosis not present

## 2020-07-09 DIAGNOSIS — J9621 Acute and chronic respiratory failure with hypoxia: Secondary | ICD-10-CM | POA: Diagnosis not present

## 2020-07-09 DIAGNOSIS — U071 COVID-19: Secondary | ICD-10-CM | POA: Diagnosis not present

## 2020-07-09 DIAGNOSIS — J449 Chronic obstructive pulmonary disease, unspecified: Secondary | ICD-10-CM | POA: Diagnosis not present

## 2020-07-09 LAB — BASIC METABOLIC PANEL
Anion gap: 10 (ref 5–15)
BUN: 20 mg/dL (ref 8–23)
CO2: 29 mmol/L (ref 22–32)
Calcium: 8.5 mg/dL — ABNORMAL LOW (ref 8.9–10.3)
Chloride: 93 mmol/L — ABNORMAL LOW (ref 98–111)
Creatinine, Ser: 0.53 mg/dL — ABNORMAL LOW (ref 0.61–1.24)
GFR, Estimated: >60 mL/min (ref >60)
Glucose, Bld: 131 mg/dL — ABNORMAL HIGH (ref 70–99)
Potassium: 3.9 mmol/L (ref 3.5–5.1)
Sodium: 132 mmol/L — ABNORMAL LOW (ref 135–145)

## 2020-07-09 LAB — CULTURE, BLOOD (ROUTINE X 2)
Culture: NO GROWTH
Special Requests: ADEQUATE

## 2020-07-09 LAB — PREALBUMIN: Prealbumin: 11 mg/dL — ABNORMAL LOW (ref 18–38)

## 2020-07-09 LAB — C-REACTIVE PROTEIN: CRP: 3.1 mg/dL — ABNORMAL HIGH (ref <1.0)

## 2020-07-09 NOTE — TOC Initial Note (Signed)
Transition of Care [facility]) - Initial/Assessment Note    Patient Details  Name: Zachary Chambers MRN: [id number] Date of Birth: July 11, 1951  [facility] CM/SW Contact:    Trish Mage, LCSW Phone Number: 07/09/2020, 2:51 PM  Clinical Narrative:    Called patient in follow up to PT recommendation of [facility].  He did not answer room phone, so I reached out to his SO Ms Zachary Chambers.  She states that they are in the process of moving some adult children out, and their home is not "visitor ready," so she would prefer he go to outpatient PT.  "He is basically lazy, and this way he will have to get up, get dressed and go." She will provide transportation.  She also asked for hospital bed, specifically for delivery on Friday, and rollator and bedside commode. TOC will continue to follow during the course of hospitalization.               Expected Discharge Plan: Home/Self Care Barriers to Discharge: No Barriers Identified   Patient Goals and CMS Choice     Choice offered to / list presented to : Spouse  Expected Discharge Plan and Services Expected Discharge Plan: Home/Self Care   Discharge Planning Services: CM Consult Post Acute Care Choice: Durable Medical Equipment Living arrangements for the past 2 months: Single Family Home                 DME Arranged: Bedside commode, Hospital bed, Walker rolling with seat DME Agency: [employer] Date DME Agency Contacted: 07/09/20 Time DME Agency Contacted: 1450 Representative spoke with at DME Agency: [place] Arrangements/Services Living arrangements for the past 2 months: [place] with:: Significant Other Patient language and need for interpreter reviewed:: Yes        Need for Family Participation in Patient Care: Yes (Comment) Care giver support system in place?: Yes (comment)   Criminal Activity/Legal Involvement Pertinent to Current Situation/Hospitalization: No - Comment as  needed  Activities of Daily Living Home Assistive Devices/Equipment: Eyeglasses, Nebulizer, Other (Comment), Built-in shower seat (ramp entrance, tub/shower unit, walk-in shower) ADL Screening (condition at time of admission) Patient's cognitive ability adequate to safely complete daily activities?: Yes Is the patient deaf or have difficulty hearing?: No Does the patient have difficulty seeing, even when wearing glasses/contacts?: No Does the patient have difficulty concentrating, remembering, or making decisions?: No Patient able to express need for assistance with ADLs?: Yes Does the patient have difficulty dressing or bathing?: No Independently performs ADLs?: Yes (appropriate for developmental age) Does the patient have difficulty walking or climbing stairs?: Yes (secondary to weakness and shortness of breath) Weakness of Legs: Both Weakness of Arms/Hands: None  Permission Sought/Granted Permission sought to share information with : Family Supports Permission granted to share information with : Yes, Verbal Permission Granted  Share Information with NAME: Zachary Chambers (Significant other) [phone]           Emotional Assessment Appearance:: Appears stated age     Orientation: : Oriented to Self, Oriented to Place, Oriented to Situation Alcohol / Substance Use: Not Applicable Psych Involvement: No (comment)  Admission diagnosis:  Dyspnea [R06.00] Atrial fibrillation with rapid ventricular response ([place]) [I48.91] Community acquired pneumonia of right lower lobe of lung [J18.9] Pneumonia due to COVID-19 virus [U07.1, J12.82] Patient Active Problem List   Diagnosis Date Noted  . Pneumonia due to COVID-19 virus 07/05/2020  .  Acute on chronic respiratory failure with hypoxia ([place]) 07/05/2020  . Transient atrial fibrillation ([place]) 07/05/2020  . Dyspnea 07/04/2020  . Protein-calorie malnutrition, severe 03/07/2020  . S/P coronary artery stent placement   . Chest pain  03/06/2020  . Dyslipidemia 03/06/2020  . Healthcare maintenance 03/05/2020  . Heavy alcohol use 03/04/2018  . Memory changes 03/04/2018  . Leukopenia 05/04/2017  . Bruit 01/22/2014  . Muscle spasm of back 09/20/2013  . COPD (chronic obstructive pulmonary disease) ([place]) 01/14/2011  . CAD (coronary artery disease) 01/14/2011  . GERD (gastroesophageal reflux disease) 01/14/2011  . TOBACCO ABUSE 07/25/2009  . Osteoarthritis 07/25/2009  . ROTATOR CUFF SYNDROME, RIGHT 07/25/2009   PCP:  [name], MD Pharmacy:   [place], [place] - [address] [place] [address] Phone: [phone] Fax: [phone]  [facility] - [place], [place] [address] Phone: [phone] Fax: [phone]     Social Determinants of Health (SDOH) Interventions    Readmission Risk Interventions No flowsheet data found.

## 2020-07-09 NOTE — Progress Notes (Signed)
PROGRESS NOTE  Zachary Chambers  HYW:[id number] DOB: 02-26-1951 DOA: 07/04/2020 PCP: [name], MD   Brief Narrative: Zachary Chambers is an 69 y.o. male past medical history significant for COPD on 2 L of oxygen ongoing tobacco abuse comes in with 2 weeks of increased shortness of breath and cough, he is unvaccinated chest x-ray shows possible right lower lobe pneumonia started empirically on Rocephin and azithromycin.  Assessment & Plan: Principal Problem:   Pneumonia due to COVID-19 virus Active Problems:   TOBACCO ABUSE   COPD (chronic obstructive pulmonary disease) (HCC)   Acute on chronic respiratory failure with hypoxia (HCC)   Transient atrial fibrillation (HCC)  Acute on chronic hypoxemic respiratory failure due to covid-19 pneumonia on COPD: SARS-CoV-2 PCR positive on 07/04/2020.  - Will repeat ambulatory pulse oximetry 11/24 in preparation for discharge. Pt not on 24/7 oxygen at home, but has been on it about 2 years and using it more frequently as of late. Hypoxia continues to be severe, limiting ability to mobilize and ability to discharge. - Completed remdesivir x5 days (11/18 - 11/22) - Continue steroids x10 days, augmenting as below. CRP remains elevated. - Continue baricitinib, due to continued inflammatory marker elevation.  - Encourage OOB, IS, FV, and awake proning if able - Continue airborne, contact precautions for 21 days from positive testing. - Monitor CMP and inflammatory markers - Enoxaparin prophylactic dose.   S. capitis in 1 of 4 blood cultures: Suspected to be contaminant. Monitoring off abx for now. PCT negative.   COPD with acute exacerbation: Having some wheezing, increased dyspnea, prolonged expiration.  - Increased steroids, will continue that dose. - Continue bronchodilators combivent and added prn albuterol.  Protein calorie malnutrition, NOS: Prealbumin low at 11. - Dietitian consult - Patient is up to date for colon CA screening, last  colonoscopy 2017 by Dr. Henrene Pastor with 5 year f/u recommended.  AKI: With sarcopenia, SCr baseline is lower than normal limits, has improved from 0.96 >> 0.57 with treatment. - Avoid nephrotoxins.  Tobacco use:  - Cessation counseling  Transient atrial fibrillation: Reported. Telemetry review this morning reveals only occasional sinus bradycardia.   DVT prophylaxis: Lovenox Code Status: Partial Family Communication: None at bedside Disposition Plan:  Status is: Inpatient  Remains inpatient appropriate because:Inpatient level of care appropriate due to severity of illness  Dispo: The patient is from: Home              Anticipated d/c is to: Home (per PT/OT evaluations)              Anticipated d/c date is: 1 day              Patient currently is not medically stable to d/c.  Consultants:   None  Procedures:   None  Antimicrobials:  Remdesivir, ceftriaxone, azithromycin   Subjective: Shortness of breath waxes and wanes, worse with exertion, still more hypoxic than baseline. No chest pain, wheezing improved from yesterday. Declined OT evaluation yesterday due to dyspnea.  Objective: Vitals:   07/08/20 0433 07/08/20 1530 07/08/20 2100 07/09/20 0519  BP: 125/71 114/76 127/80 126/71  Pulse: (!) 50 63 (!) 54 (!) 53  Resp: 19 18 16 18   Temp: 97.8 F (36.6 C) 98.1 F (36.7 C) 98.4 F (36.9 C) 97.8 F (36.6 C)  TempSrc: Oral  Oral Oral  SpO2: 96% 94% 100% 96%  Weight:      Height:        Intake/Output Summary (Last 24 hours)  at 07/09/2020 5681 Last data filed at 07/09/2020 2751 Gross per 24 hour  Intake 240 ml  Output 2350 ml  Net -2110 ml   Filed Weights   07/04/20 0719  Weight: 53.1 kg   Gen: 69 y.o. male in no distress Pulm: Nonlabored breathing supplemental oxygen at rest, rate is 20/min, diminished diffusely with prolonged expiration, no wheezes or crackles this AM CV: Regular rate and rhythm. No murmur, rub, or gallop. No JVD, no dependent edema. GI:  Abdomen soft, non-tender, non-distended, with normoactive bowel sounds.  Ext: Warm, no deformities, decreased muscle bulk. Skin: No rashes, lesions or ulcers on visualized skin. Neuro: Alert and oriented. No focal neurological deficits. Psych: Judgement and insight appear fair. Mood euthymic & affect congruent. Behavior is appropriate.    Data Reviewed: I have personally reviewed following labs and imaging studies  CBC: Recent Labs  Lab 07/04/20 0759 07/05/20 0447  WBC 5.5 5.2  NEUTROABS 4.1  --   HGB 15.4 14.5  HCT 44.2 43.4  MCV 89.8 92.1  PLT 226 700   Basic Metabolic Panel: Recent Labs  Lab 07/04/20 0759 07/05/20 0447 07/06/20 0425 07/07/20 0349 07/08/20 0405 07/09/20 0404  NA 129* 132*  --   --  131* 132*  K 3.7 3.7  --   --  3.5 3.9  CL 92* 101  --   --  95* 93*  CO2 26 22  --   --  27 29  GLUCOSE 128* 109*  --   --  102* 131*  BUN 22 19  --   --  17 20  CREATININE 0.95 0.65  --   --  0.57* 0.53*  CALCIUM 8.5* 7.8*  --   --  8.2* 8.5*  MG  --  1.9 2.1 2.2  --   --   PHOS  --  2.8 2.8 2.5  --   --    GFR: Estimated Creatinine Clearance: 63.6 mL/min (A) (by C-G formula based on SCr of 0.53 mg/dL (L)). Liver Function Tests: Recent Labs  Lab 07/04/20 0759 07/05/20 0447 07/08/20 0405  AST 55* 56* 42*  ALT 23 23 28   ALKPHOS 55 44 42  BILITOT 1.1 0.6 1.0  PROT 7.0 5.8* 5.8*  ALBUMIN 3.8 3.1* 3.1*   Recent Results (from the past 240 hour(s))  Respiratory Panel by RT PCR (Flu A&B, Covid) - Nasopharyngeal Swab     Status: Abnormal   Collection Time: 07/04/20  7:52 AM   Specimen: Nasopharyngeal Swab  Result Value Ref Range Status   SARS Coronavirus 2 by RT PCR POSITIVE (A) NEGATIVE Final    Comment: RESULT CALLED TO, READ BACK BY AND VERIFIED WITH: ED RN AT 1145 ON 07/04/20 BY S.VANHOORNE (NOTE) SARS-CoV-2 target nucleic acids are DETECTED.  SARS-CoV-2 RNA is generally detectable in upper respiratory specimens  during the acute phase of infection.  Positive results are indicative of the presence of the identified virus, but do not rule out bacterial infection or co-infection with other pathogens not detected by the test. Clinical correlation with patient history and other diagnostic information is necessary to determine patient infection status. The expected result is Negative.  Fact Sheet for Patients:  PinkCheek.be  Fact Sheet for Healthcare Providers: GravelBags.it  This test is not yet approved or cleared by the [place] FDA and  has been authorized for detection and/or diagnosis of SARS-CoV-2 by FDA under an Emergency Use Authorization (EUA).  This EUA will remain in effect (meaning this test c an  be used) for the duration of  the COVID-19 declaration under Section 564(b)(1) of the Act, 21 U.S.C. section 360bbb-3(b)(1), unless the authorization is terminated or revoked sooner.      Influenza A by PCR NEGATIVE NEGATIVE Final   Influenza B by PCR NEGATIVE NEGATIVE Final    Comment: (NOTE) The Xpert Xpress SARS-CoV-2/FLU/RSV assay is intended as an aid in  the diagnosis of influenza from Nasopharyngeal swab specimens and  should not be used as a sole basis for treatment. Nasal washings and  aspirates are unacceptable for Xpert Xpress SARS-CoV-2/FLU/RSV  testing.  Fact Sheet for Patients: PinkCheek.be  Fact Sheet for Healthcare Providers: GravelBags.it  This test is not yet approved or cleared by the [place] FDA and  has been authorized for detection and/or diagnosis of SARS-CoV-2 by  FDA under an Emergency Use Authorization (EUA). This EUA will remain  in effect (meaning this test can be used) for the duration of the  Covid-19 declaration under Section 564(b)(1) of the Act, 21  U.S.C. section 360bbb-3(b)(1), unless the authorization is  terminated or revoked. Performed at [facility], [place] [address] [place], [place] [address]   Blood Culture (routine x 2)     Status: None   Collection Time: 07/04/20  7:59 AM   Specimen: BLOOD  Result Value Ref Range Status   Specimen Description   Final    BLOOD LEFT ANTECUBITAL Performed at [facility] [address], [place], [place] [address]    Special Requests   Final    BOTTLES DRAWN AEROBIC AND ANAEROBIC Blood Culture adequate volume Performed at [place] [address] [place], [place] [address]    Culture   Final    NO GROWTH 5 DAYS Performed at [facility], [place] [address], [place], [place] [address]    Report Status 07/09/2020 FINAL  Final  Blood Culture (routine x 2)     Status: Abnormal   Collection Time: 07/04/20  8:45 AM   Specimen: BLOOD  Result Value Ref Range Status   Specimen Description   Final    BLOOD LEFT WRIST Performed at [place] [address], [place], [place] [address]    Special Requests   Final    BOTTLES DRAWN AEROBIC AND ANAEROBIC Blood Culture adequate volume Performed at [facility] [address], [place], [place] [address]    Culture  Setup Time   Final    AEROBIC BOTTLE ONLY GRAM POSITIVE COCCI Organism ID to follow CRITICAL RESULT CALLED TO, READ BACK BY AND VERIFIED WITH: Shelda Jakes PharmD 8:35 07/05/20 ([email or web address])    Culture (A)  Final    STAPHYLOCOCCUS CAPITIS THE SIGNIFICANCE OF ISOLATING THIS ORGANISM FROM A SINGLE SET OF BLOOD CULTURES WHEN MULTIPLE SETS ARE DRAWN IS UNCERTAIN. PLEASE NOTIFY THE MICROBIOLOGY DEPARTMENT WITHIN ONE WEEK IF SPECIATION AND SENSITIVITIES ARE REQUIRED. Performed at [facility], [place] [address], [place], [place] [address]    Report Status 07/06/2020 FINAL  Final  Blood Culture ID Panel (Reflexed)     Status: Abnormal   Collection Time: 07/04/20  8:45 AM  Result Value Ref Range Status   Enterococcus faecalis NOT DETECTED NOT  DETECTED Final   Enterococcus Faecium NOT DETECTED NOT DETECTED Final   Listeria monocytogenes NOT DETECTED NOT DETECTED Final   Staphylococcus species DETECTED (A) NOT DETECTED Final    Comment: CRITICAL RESULT CALLED TO, READ BACK BY AND VERIFIED WITH: M.  Swayne PharmD 8:35 07/05/20 ([email or web address])    Staphylococcus aureus (BCID) NOT DETECTED NOT DETECTED Final   Staphylococcus epidermidis NOT DETECTED NOT DETECTED Final   Staphylococcus lugdunensis NOT DETECTED NOT DETECTED Final   Streptococcus species NOT DETECTED NOT DETECTED Final   Streptococcus agalactiae NOT DETECTED NOT DETECTED Final   Streptococcus pneumoniae NOT DETECTED NOT DETECTED Final   Streptococcus pyogenes NOT DETECTED NOT DETECTED Final   A.calcoaceticus-baumannii NOT DETECTED NOT DETECTED Final   Bacteroides fragilis NOT DETECTED NOT DETECTED Final   Enterobacterales NOT DETECTED NOT DETECTED Final   Enterobacter cloacae complex NOT DETECTED NOT DETECTED Final   Escherichia coli NOT DETECTED NOT DETECTED Final   Klebsiella aerogenes NOT DETECTED NOT DETECTED Final   Klebsiella oxytoca NOT DETECTED NOT DETECTED Final   Klebsiella pneumoniae NOT DETECTED NOT DETECTED Final   Proteus species NOT DETECTED NOT DETECTED Final   Salmonella species NOT DETECTED NOT DETECTED Final   Serratia marcescens NOT DETECTED NOT DETECTED Final   Haemophilus influenzae NOT DETECTED NOT DETECTED Final   Neisseria meningitidis NOT DETECTED NOT DETECTED Final   Pseudomonas aeruginosa NOT DETECTED NOT DETECTED Final   Stenotrophomonas maltophilia NOT DETECTED NOT DETECTED Final   Candida albicans NOT DETECTED NOT DETECTED Final   Candida auris NOT DETECTED NOT DETECTED Final   Candida glabrata NOT DETECTED NOT DETECTED Final   Candida krusei NOT DETECTED NOT DETECTED Final   Candida parapsilosis NOT DETECTED NOT DETECTED Final   Candida tropicalis NOT DETECTED NOT DETECTED Final   Cryptococcus neoformans/gattii NOT DETECTED NOT DETECTED  Final    Comment: Performed at [facility], [address] [place], [place] [address]      Radiology Studies: No results found.  Scheduled Meds:  vitamin C  500 mg Oral Daily   baricitinib  4 mg Oral Daily   enoxaparin (LOVENOX) injection  40 mg Subcutaneous Daily   guaiFENesin  600 mg Oral BID   Ipratropium-Albuterol  1 puff Inhalation Q6H   methylPREDNISolone (SOLU-MEDROL) injection  40 mg Intravenous Q12H   zinc sulfate  220 mg Oral Daily   Continuous Infusions:    LOS: 4 days   Time spent: 35 minutes.  Patrecia Pour, MD [facility] [email or web address] 07/09/2020, 8:52 AM

## 2020-07-09 NOTE — Evaluation (Signed)
Occupational Therapy Evaluation Patient Details Name: Zachary Chambers MRN: [id number] DOB: [birth year] Today's Date: 07/09/2020    History of Present Illness Zachary Chambers is an 69 y.o. male past medical history significant for COPD on 2 L of oxygen ongoing tobacco abuse comes in with 2 weeks of increased shortness of breath and cough, he is unvaccinated chest x-ray shows possible right lower lobe pneumonia started empirically on Rocephin and azithromycin.   Clinical Impression   Patient is currently requiring assistance with ADLs and functional mobility including Minimal hand held assistance for standing and pivoting to recliner or BSC, moderate assist for LE dressing and bathing and full setup for UE dressing and grooming in seated position, all of which is below patient's typical baseline report of being Independent, however unsure as to reliability of pt report at this time as he was vague with prior level of function.  During this evaluation, patient was limited by impaired activity tolerance with need of supplemental O2 at 2L and desaturation to 85% after pivot to chair with need of ~2-3 min of PLB and use of IS and flutter valve to recover back to 90%. Current limitations have the potential to impact patient's safety and independence during functional mobility, as well as performance for ADLs. [place] "6-clicks" Daily Activity Inpatient Short Form score of 18/24 indicates 46.65% ADL impairment this session. Patient lives with his spouse, who is able to provide 24/7 supervision but questionable assistance.  Patient demonstrates fair rehab potential, and should benefit from continued skilled occupational therapy services while in acute care to maximize safety, independence and quality of life at home.  Continued occupational therapy services in the home is recommended.  ?   Follow Up Recommendations  Home health OT;Supervision - Intermittent    Equipment Recommendations        Recommendations for Other Services       Precautions / Restrictions Precautions Precautions: Fall Precaution Comments: monitor sats, on home O2 Restrictions Weight Bearing Restrictions: No      Mobility Bed Mobility Overal bed mobility: Needs Assistance Bed Mobility: Supine to Sit     Supine to sit: Supervision;HOB elevated     General bed mobility comments: Increased time/effort and use of bed rails to sit EOB.    Transfers Overall transfer level: Needs assistance Equipment used: 1 person hand held assist Transfers: Sit to/from [facility] Sit to Stand: Min assist Stand pivot transfers: Min assist       General transfer comment: Pt stood from EOB and reached for furniture outside COG with loss of balance and need to lower back to EOB for safety.  2nd stand with RT HHA and increased verbal cues for safety. Pt able to pivot, reaching with LT UE for arms of recliner for increased UE support.    Balance Overall balance assessment: Needs assistance Sitting-balance support: No upper extremity supported;Feet supported Sitting balance-Leahy Scale: Good     Standing balance support: Single extremity supported;During functional activity Standing balance-Leahy Scale: Poor Standing balance comment: Unsafe reaching outside COG, need of Min As for balance.                           ADL either performed or assessed with clinical judgement   ADL Overall ADL's : Needs assistance/impaired Eating/Feeding: Independent;Sitting   Grooming: Sitting;Set up;Wash/dry hands   Upper Body Bathing: Set up;Sitting Upper Body Bathing Details (indicate cue type and reason): Based on general assessment. Lower  Body Bathing: Moderate assistance;Sitting/lateral leans;Sit to/from stand Lower Body Bathing Details (indicate cue type and reason): Based on general assessment. Upper Body Dressing : Set up;Sitting   Lower Body Dressing: Moderate assistance;Sitting/lateral  leans;Sit to/from stand   Toilet Transfer: Minimal assistance;BSC;Cueing for safety;Stand-pivot Toilet Transfer Details (indicate cue type and reason): Min HHA with unilatearl UE support. Toileting- Clothing Manipulation and Hygiene: Minimal assistance;Sitting/lateral lean;Sit to/from stand Toileting - Clothing Manipulation Details (indicate cue type and reason): Based on general assessment.     Functional mobility during ADLs: Minimal assistance General ADL Comments: Min As with gait belt with RT HHA.     Vision   Vision Assessment?: No apparent visual deficits     Perception     Praxis      Pertinent Vitals/Pain Pain Assessment: No/denies pain     Hand Dominance Right   Extremity/Trunk Assessment Upper Extremity Assessment Upper Extremity Assessment: RUE deficits/detail RUE Deficits / Details: Old RTC injury with ROM to ~85 degrees with compensatory movements at scapula.   Lower Extremity Assessment Lower Extremity Assessment: Generalized weakness   Cervical / Trunk Assessment Cervical / Trunk Assessment: Kyphotic (Very stooped in standing.)   Communication Communication Communication: No difficulties   Cognition Arousal/Alertness: Awake/alert Behavior During Therapy: WFL for tasks assessed/performed;Flat affect Overall Cognitive Status: No family/caregiver present to determine baseline cognitive functioning Area of Impairment: Orientation;Awareness                 Orientation Level: Time         Awareness: Intellectual       General Comments       Exercises     Shoulder Instructions      Home Living Family/patient expects to be discharged to:: Private residence Living Arrangements: Spouse/significant other Available Help at Discharge: Family Type of Home: House Home Access: Ramped entrance     Home Layout: One level     Bathroom Shower/Tub: Tub/shower unit;Walk-in shower (Pt reports mostly sponge baths as showers cause him anxiety.)    Bathroom Toilet: Standard     Home Equipment: Clinical cytogeneticist - 2 wheels;Wheelchair - manual;Bedside commode          Prior Functioning/Environment Level of Independence: Independent        Comments: Pt somewhat vague with prior level, stating initially that he has been completely independent, but then relaying some difficulty with self care and IADLs.        OT Problem List: Decreased strength;Decreased range of motion;Decreased activity tolerance;Decreased knowledge of use of DME or AE;Impaired balance (sitting and/or standing);Decreased knowledge of precautions      OT Treatment/Interventions: Self-care/ADL training;Therapeutic exercise;Therapeutic activities;Energy conservation;DME and/or AE instruction;Patient/family education;Balance training    OT Goals(Current goals can be found in the care plan section) Acute Rehab OT Goals Patient Stated Goal: to go home today OT Goal Formulation: With patient Potential to Achieve Goals: Fair (OT goals) ADL Goals Pt Will Perform Lower Body Dressing: with adaptive equipment;with set-up;sit to/from stand;sitting/lateral leans Pt Will Transfer to Toilet: with modified independence;ambulating Pt Will Perform Toileting - Clothing Manipulation and hygiene: with modified independence;with adaptive equipment;sitting/lateral leans;sit to/from stand Additional ADL Goal #1: Pt will identify at least 3 energy conservation stragies to employ at home in order to avoid relapse and rehospitalization. Additional ADL Goal #2: Pt will engage in a 5 min standing activity such as grooming with unilateral support on RW or sink and supervision with SpO2 at 88% or above.  OT Frequency: Min 2X/week   Barriers to D/C:  Unsure of support system.  Pt stated his wife is "in batter shape than me" but did not specify whether she can assist as needed after discharge.       Co-evaluation              AM-PAC OT "6 Clicks" Daily Activity     Outcome  Measure Help from another person eating meals?: None Help from another person taking care of personal grooming?: A Little Help from another person toileting, which includes using toliet, bedpan, or urinal?: A Little Help from another person bathing (including washing, rinsing, drying)?: A Little Help from another person to put on and taking off regular upper body clothing?: A Little Help from another person to put on and taking off regular lower body clothing?: A Lot 6 Click Score: 18   End of Session Equipment Utilized During Treatment: Gait belt;Oxygen Nurse Communication: Mobility status  Activity Tolerance: Patient tolerated treatment well Patient left: in chair;with call bell/phone within reach;with chair alarm set  OT Visit Diagnosis: Unsteadiness on feet (R26.81)                Time: 3382-5053 OT Time Calculation (min): 25 min Charges:  OT General Charges $OT Visit: 1 Visit OT Evaluation $OT Eval Low Complexity: 1 Low OT Treatments $Self Care/Home Management : 8-22 mins  Anderson [place], [place] Office: [phone] 07/09/2020  Julien Girt 07/09/2020, 9:37 AM

## 2020-07-09 NOTE — Clinical Social Work Note (Signed)
    Durable Medical Equipment  (From admission, onward)         Start     Ordered   07/09/20 1455  For home use only DME 3 n 1  Once        07/09/20 1454   07/09/20 1454  For home use only DME 4 wheeled rolling walker with seat  Once       Question:  Patient needs a walker to treat with the following condition  Answer:  Gait instability   07/09/20 1454   07/09/20 1454  For home use only DME Hospital bed  Once       Question Answer Comment  Length of Need 6 Months   Patient has (list medical condition): Covid-19 pneumonia, acute on chronic hypoxic respiratory failure, malnutrition, severe deconditioning/generalized weakness   The above medical condition requires: Patient requires the ability to reposition frequently   Bed type Semi-electric      07/09/20 1454

## 2020-07-10 DIAGNOSIS — I4891 Unspecified atrial fibrillation: Secondary | ICD-10-CM | POA: Diagnosis not present

## 2020-07-10 DIAGNOSIS — J9601 Acute respiratory failure with hypoxia: Secondary | ICD-10-CM | POA: Diagnosis not present

## 2020-07-10 DIAGNOSIS — J9621 Acute and chronic respiratory failure with hypoxia: Secondary | ICD-10-CM | POA: Diagnosis not present

## 2020-07-10 DIAGNOSIS — U071 COVID-19: Secondary | ICD-10-CM | POA: Diagnosis not present

## 2020-07-10 MED ORDER — DEXAMETHASONE 6 MG PO TABS: 6.0000 mg | ORAL_TABLET | Freq: Every day | ORAL | 0 refills | Status: AC

## 2020-07-10 NOTE — Progress Notes (Signed)
Physical Therapy Treatment Patient Details Name: Zachary Chambers MRN: [id number] DOB: [birth year] Today's Date: 07/10/2020    History of Present Illness Zachary Chambers is an 69 y.o. male past medical history significant for COPD on 2 L of oxygen ongoing tobacco abuse comes in with 2 weeks of increased shortness of breath and cough, he is unvaccinated chest x-ray shows possible right lower lobe pneumonia started empirically on Rocephin and azithromycin.    PT Comments    Patient  Required encouragement and extra time to mobilize. Patient did ambulate with rollator but  Demonstrated difficulty gripping right hand, hand kept sliding off. SPO2 post ambulation on 2 L 87%.  PATIENT REPORTS NUMBNESS OF RIGHT FINGERS. PATIENT UNABLE TO GRIP RW HANDGRIP.  Marland KitchenProvided rube tube to build up utensil handle due to right hand weakness.    Patient is to Dc home today.   Home health PT     Equipment Recommendations   (rollator)    Recommendations for Other Services       Precautions / Restrictions Precautions Precautions: Fall Precaution Comments: monitor sats, on home O2    Mobility  Bed Mobility   Bed Mobility: Supine to Sit     Supine to sit: Supervision;HOB elevated     General bed mobility comments: Increased time/effort and use of bed rails to sit EOB.  Transfers Overall transfer level: Needs assistance Equipment used: 4-wheeled walker Transfers: Sit to/from Stand Sit to Stand: Min assist         General transfer comment: multimodal cues for safety using 4 wheeled Rw, lock and unlock brakes. Patient had much difficult gripping right hand onto RW, it tended to slide off.  Ambulation/Gait Ambulation/Gait assistance: Min Web designer (Feet): 15 Feet Assistive device: 4-wheeled walker Gait Pattern/deviations: Drifts right/left;Staggering right;Staggering left Gait velocity: decr   General Gait Details: much assistance to guide Rw, R hand did not remain  gripped to handle,  patient reaching down to upright support, assisted hand back onto RW.   Stairs             Wheelchair Mobility    Modified Rankin (Stroke Patients Only)       Balance Overall balance assessment: Needs assistance   Sitting balance-Leahy Scale: Good Sitting balance - Comments: prop sits on bed edge   Standing balance support: Bilateral upper extremity supported;During functional activity Standing balance-Leahy Scale: Poor Standing balance comment: decreased right hand support on RW caused imbalance                            Cognition Arousal/Alertness: Awake/alert Behavior During Therapy: WFL for tasks assessed/performed;Flat affect                                   General Comments: requires much encouragement      Exercises      General Comments        Pertinent Vitals/Pain Pain Assessment: No/denies pain    Home Living                      Prior Function            PT Goals (current goals can now be found in the care plan section) Progress towards PT goals: Progressing toward goals    Frequency    Min 3X/week      PT Plan Current  plan remains appropriate    Co-evaluation              AM-PAC PT "6 Clicks" Mobility   Outcome Measure  Help needed turning from your back to your side while in a flat bed without using bedrails?: None Help needed moving from lying on your back to sitting on the side of a flat bed without using bedrails?: None Help needed moving to and from a bed to a chair (including a wheelchair)?: A Little Help needed standing up from a chair using your arms (e.g., wheelchair or bedside chair)?: A Little Help needed to walk in hospital room?: A Lot Help needed climbing 3-5 steps with a railing? : A Lot 6 Click Score: 18    End of Session Equipment Utilized During Treatment: Oxygen Activity Tolerance: Patient limited by fatigue Patient left: in bed;with call bell/phone within  reach;with chair alarm set Nurse Communication: Mobility status PT Visit Diagnosis: Unsteadiness on feet (R26.81);Difficulty in walking, not elsewhere classified (R26.2)     Time: 1308-6578 PT Time Calculation (min) (ACUTE ONLY): 21 min  Charges:  $Gait Training: 8-22 mins                     Zachary Chambers Pager [phone] Office [phone]    Zachary Chambers 07/10/2020, 1:14 PM

## 2020-07-10 NOTE — Progress Notes (Signed)
     Show:Clear all [x] Manual[] Template[] Copied  Added by: [x] McIntyre-Williams, Farris Geiman, RN  [] Hover for details Patient has been discharge instructions. Patient has verbalized understanding.   Telemetry has been discontinued.  DME at bedside and will be taken home with the the patient when significant other picks him up in private vehicle.  Will continue to monitor.

## 2020-07-10 NOTE — TOC Transition Note (Signed)
Transition of Care [facility]) - CM/SW Discharge Note   Patient Details  Name: Zachary Chambers MRN: [id number] Date of Birth: 08/07/1951  Transition of Care [facility] CM/SW Contact:  Trish Mage, LCSW Phone Number: 07/10/2020, 12:13 PM   Clinical Narrative:  Patient who is discharging today has been set up with travel O2, DME as requested by wife, [name].  She also requested portable concentrator unit.  I let her know insurance has to approve that, and they will not know prior to his d/c.  As for [facility] PT recommendation, she does not want someone coming into the home, but would prefer he go to outpatient.  Told her he is to be isolated for 21 days, and they will not see him until after the 9th.  She voiced understanding. Wife will transport home.  TOC sign off.     Final next level of care: Home/Self Care Barriers to Discharge: No Barriers Identified   Patient Goals and CMS Choice     Choice offered to / list presented to : Spouse  Discharge Placement                       Discharge Plan and Services   Discharge Planning Services: CM Consult Post Acute Care Choice: Durable Medical Equipment          DME Arranged: Bedside commode, Hospital bed, Walker rolling with seat DME Agency: [employer] Date DME Agency Contacted: 07/09/20 Time DME Agency Contacted: [phone] Representative spoke with at DME Agency: [place] ([place]) Interventions     Readmission Risk Interventions No flowsheet data found.

## 2020-07-10 NOTE — Discharge Summary (Signed)
Physician Discharge Summary  Zachary Chambers NOM:[id number] DOB: 02-Apr-1951 DOA: 07/04/2020  PCP: [name], MD  Admit date: 07/04/2020 Discharge date: 07/10/2020  Admitted From: home Disposition:  home  Recommendations for Outpatient Follow-up:  1. Follow up with PCP in 1-2 weeks  Home Health: PT, OT Equipment/Devices: 3 in 1, walker, hospital bed, home O2  Discharge Condition: stable CODE STATUS: Partial  Diet recommendation: regular  HPI: Per admitting MD, Zachary Chambers is a 69 y.o. male with medical history significant of COPD on 2L home O2 at night, HLD, tobacco abuse. Presenting with 2 weeks of increasing shortness of breath and non-productive cough for just as long. He reports that he was found it more and more difficult to catch his breath with or without activity. He has not tried increasing inhalers or his O2 use. He has not visited his PCP about this problem. He denies F/N/V. He reports sick contact (family with COVID) and reports that he is unvaccinated for [place]. He decided this morning that he was tired of dealing with this and came to the ED.    Hospital Course / Discharge diagnoses: Principal problem Acute on chronic hypoxemic respiratory failure due to covid-19 pneumonia on COPD exacerbation- SARS-CoV-2 PCR positive on 07/04/2020.  Patient was started on remdesivir, baricitinib, steroids and completed 5 days of remdesivir in the hospital.  His respiratory status improved, returned back to baseline, he is wheezing has resolved and will be discharged home in stable condition with 4 additional days of steroids to complete a 10-day course.  Active problems S. capitis in 1 of 4 blood cultures - Suspected to be contaminant. PCT negative.  COPD with acute exacerbation-initially having some wheezing, dyspnea, but this has now resolved and his respiratory status is back to baseline Protein calorie malnutrition, NOS - Prealbumin low at 11. AKI - With sarcopenia, SCr  baseline is lower than normal limits, creatinine has now normalized Tobacco use -quit 4 months ago Transient atrial fibrillation-transient, resolved, likely in the setting of acute illness.  Has remained in sinus.  If it recurs may need anticoagulation.  Discharge Instructions   Allergies as of 07/10/2020   No Known Allergies     Medication List    STOP taking these medications   predniSONE 10 MG tablet Commonly known as: DELTASONE     TAKE these medications   albuterol (2.5 MG/3ML) 0.083% nebulizer solution Commonly known as: PROVENTIL Take 3 mLs (2.5 mg total) by nebulization every 4 (four) hours as needed for wheezing or shortness of breath. What changed: Another medication with the same name was changed. Make sure you understand how and when to take each.   albuterol 108 (90 Base) MCG/ACT inhaler Commonly known as: VENTOLIN HFA INHALE 2 PUFFS INTO THE LUNGS EVERY 4 HOURS AS NEEDED FOR WHEEZING AND SHORTNESS OF BREATH What changed:   how much to take  how to take this  when to take this  reasons to take this  additional instructions   aspirin 81 MG tablet Take 81 mg by mouth at bedtime.   atorvastatin 40 MG tablet Commonly known as: LIPITOR TAKE 1 TABLET BY MOUTH NIGHTLY AT BEDTIME What changed: See the new instructions.   Breztri Aerosphere 160-9-4.8 MCG/ACT Aero Generic drug: Budeson-Glycopyrrol-Formoterol Inhale 2 puffs into the lungs 2 (two) times daily. What changed: Another medication with the same name was removed. Continue taking this medication, and follow the directions you see here.   budesonide-formoterol 160-4.5 MCG/ACT inhaler Commonly known as:  Symbicort Inhale 2 puffs into the lungs 2 (two) times daily.   celecoxib 100 MG capsule Commonly known as: CELEBREX Take 100 mg by mouth in the morning and at bedtime.   dexamethasone 6 MG tablet Commonly known as: DECADRON Take 1 tablet (6 mg total) by mouth daily for 4 days.   fluticasone 50  MCG/ACT nasal spray Commonly known as: FLONASE USE 2 SPRAYS in EACH NOSTRIL EVERY DAY   folic acid 1 MG tablet Commonly known as: FOLVITE TAKE 1 TABLET BY MOUTH EVERY DAY   GLUCOSAMINE PO Take 1,500 mg by mouth daily.   hydrOXYzine 10 MG tablet Commonly known as: ATARAX/VISTARIL TAKE 1 TABLET BY MOUTH 3 TIMES DAILY AS NEEDED What changed:   when to take this  reasons to take this   multivitamin with minerals Tabs tablet Take 1 tablet by mouth daily.   nitroGLYCERIN 0.4 MG SL tablet Commonly known as: Nitrostat dissolve 1 TABLET UNDER THE TONGUE every 5 minutes AS NEEDED CHEST PAIN What changed:   how much to take  how to take this  when to take this  reasons to take this  additional instructions   omeprazole 40 MG capsule Commonly known as: PRILOSEC TAKE 1 CAPSULE BY MOUTH EVERY DAY What changed: how much to take   Spiriva HandiHaler 18 MCG inhalation capsule Generic drug: tiotropium Place 1 capsule (18 mcg total) into inhaler and inhale daily.   tiZANidine 4 MG tablet Commonly known as: ZANAFLEX TAKE 1 TABLET BY MOUTH EVERY EIGHT HOURS AS NEEDED FOR MUSCLE SPASMS What changed: See the new instructions.   traMADol 50 MG tablet Commonly known as: ULTRAM TAKE 1 TABLET BY MOUTH EVERY 6 HOURS AS NEEDED FOR moderate PAIN What changed:   how much to take  how to take this  when to take this  reasons to take this  additional instructions   zinc gluconate 50 MG tablet Take 50 mg by mouth daily.            Durable Medical Equipment  (From admission, onward)         Start     Ordered   07/09/20 1455  For home use only DME 3 n 1  Once        07/09/20 1454   07/09/20 1454  For home use only DME 4 wheeled rolling walker with seat  Once       Question:  Patient needs a walker to treat with the following condition  Answer:  Gait instability   07/09/20 1454   07/09/20 1454  For home use only DME Hospital bed  Once       Question Answer Comment    Length of Need 6 Months   Patient has (list medical condition): Covid-19 pneumonia, acute on chronic hypoxic respiratory failure, malnutrition, severe deconditioning/generalized weakness   The above medical condition requires: Patient requires the ability to reposition frequently   Bed type Semi-electric      07/09/20 1454          Follow-up Information    [place], Modena Nunnery, MD. Schedule an appointment as soon as possible for a visit in 1 week(s).   Specialty: Family Medicine Contact information: [address] [place] [address] [phone]        Josue Hector, MD .   Specialty: Cardiology Contact information: [phone] [address]. [address] [place] [address] [phone]  Consultations:  None   Procedures/Studies:  CT Angio Chest PE W/Cm &/Or Wo Cm  Result Date: 07/04/2020 CLINICAL DATA:  Cough, shortness of breath and fever. COVID-19 positive. Elevated D-dimer. EXAM: CT ANGIOGRAPHY CHEST WITH CONTRAST TECHNIQUE: Multidetector CT imaging of the chest was performed using the standard protocol during bolus administration of intravenous contrast. Multiplanar CT image reconstructions and MIPs were obtained to evaluate the vascular anatomy. CONTRAST:  132mL OMNIPAQUE IOHEXOL 350 MG/ML SOLN COMPARISON:  Low dose chest CT dated 07/20/2019. Chest radiographs obtained earlier today FINDINGS: Cardiovascular: Satisfactory opacification of the pulmonary arteries to the segmental level. No evidence of pulmonary embolism. Normal heart size. No pericardial effusion. Atheromatous calcifications, including the coronary arteries and aorta. Mediastinum/Nodes: No enlarged mediastinal, hilar, or axillary lymph nodes. Thyroid gland, trachea, and esophagus demonstrate no significant findings. Lungs/Pleura: The lungs are hyperexpanded with stable scarring at the lung bases and posterior upper lobe. Interval patchy interstitial and airspace opacities in the  posterolateral right middle lobe and patchy interstitial opacities in the lateral aspect of the right upper lobe inferiorly. No pleural fluid. No lung nodules. Upper Abdomen: Unremarkable. Musculoskeletal: Thoracic and lower cervical spine degenerative changes and mild scoliosis. Review of the MIP images confirms the above findings. IMPRESSION: 1. No pulmonary emboli. 2. Interval patchy interstitial and airspace opacities in the posterolateral right middle lobe and patchy interstitial opacities in the lateral aspect of the right upper lobe. This is compatible with pneumonia/pneumonitis. 3. Stable changes of COPD with stable scarring. 4. Calcific coronary artery and aortic atherosclerosis. Aortic Atherosclerosis (ICD10-I70.0) and Emphysema (ICD10-J43.9). Electronically Signed   By: Claudie Revering M.D.   On: 07/04/2020 11:44   DG Chest Port 1 View  Result Date: 07/04/2020 CLINICAL DATA:  Shortness of breath and weakness. EXAM: PORTABLE CHEST 1 VIEW COMPARISON:  03/06/2020 FINDINGS: Normal sized heart. Coronary artery stent. The lungs remain hyperexpanded. No significant change in pleural fluid or thickening at the right lung base. Interval mild patchy opacity at the right lung base. Stable healing left 8th rib fracture laterally IMPRESSION: 1. Interval mild patchy atelectasis or pneumonia at the right lung base. 2. Stable changes of COPD. Electronically Signed   By: Claudie Revering M.D.   On: 07/04/2020 08:11     Subjective: - no chest pain, shortness of breath, no abdominal pain, nausea or vomiting.   Discharge Exam: BP 140/83 (BP Location: Right Arm)   Pulse 60   Temp 97.6 F (36.4 C)   Resp 12   Ht 5\' 8"  (1.727 m)   Wt 53.1 kg   SpO2 96%   BMI 17.79 kg/m   General: Pt is alert, awake, not in acute distress Cardiovascular: RRR, S1/S2 +, no rubs, no gallops Respiratory: CTA bilaterally, no wheezing, no rhonchi Abdominal: Soft, NT, ND, bowel sounds + Extremities: no edema, no cyanosis   The  results of significant diagnostics from this hospitalization (including imaging, microbiology, ancillary and laboratory) are listed below for reference.     Microbiology: Recent Results (from the past 240 hour(s))  Respiratory Panel by RT PCR (Flu A&B, Covid) - Nasopharyngeal Swab     Status: Abnormal   Collection Time: 07/04/20  7:52 AM   Specimen: Nasopharyngeal Swab  Result Value Ref Range Status   SARS Coronavirus 2 by RT PCR POSITIVE (A) NEGATIVE Final    Comment: RESULT CALLED TO, READ BACK BY AND VERIFIED WITH: ED RN AT 1145 ON 07/04/20 BY S.VANHOORNE (NOTE) SARS-CoV-2 target nucleic acids are DETECTED.  SARS-CoV-2 RNA is generally  detectable in upper respiratory specimens  during the acute phase of infection. Positive results are indicative of the presence of the identified virus, but do not rule out bacterial infection or co-infection with other pathogens not detected by the test. Clinical correlation with patient history and other diagnostic information is necessary to determine patient infection status. The expected result is Negative.  Fact Sheet for Patients:  PinkCheek.be  Fact Sheet for Healthcare Providers: GravelBags.it  This test is not yet approved or cleared by the [place] FDA and  has been authorized for detection and/or diagnosis of SARS-CoV-2 by FDA under an Emergency Use Authorization (EUA).  This EUA will remain in effect (meaning this test c an be used) for the duration of  the COVID-19 declaration under Section 564(b)(1) of the Act, 21 U.S.C. section 360bbb-3(b)(1), unless the authorization is terminated or revoked sooner.      Influenza A by PCR NEGATIVE NEGATIVE Final   Influenza B by PCR NEGATIVE NEGATIVE Final    Comment: (NOTE) The Xpert Xpress SARS-CoV-2/FLU/RSV assay is intended as an aid in  the diagnosis of influenza from Nasopharyngeal swab specimens and  should not be used  as a sole basis for treatment. Nasal washings and  aspirates are unacceptable for Xpert Xpress SARS-CoV-2/FLU/RSV  testing.  Fact Sheet for Patients: PinkCheek.be  Fact Sheet for Healthcare Providers: GravelBags.it  This test is not yet approved or cleared by the [place] FDA and  has been authorized for detection and/or diagnosis of SARS-CoV-2 by  FDA under an Emergency Use Authorization (EUA). This EUA will remain  in effect (meaning this test can be used) for the duration of the  Covid-19 declaration under Section 564(b)(1) of the Act, 21  U.S.C. section 360bbb-3(b)(1), unless the authorization is  terminated or revoked. Performed at [facility], [place] [address] [place], [place] [address]   Blood Culture (routine x 2)     Status: None   Collection Time: 07/04/20  7:59 AM   Specimen: BLOOD  Result Value Ref Range Status   Specimen Description   Final    BLOOD LEFT ANTECUBITAL Performed at [facility] [address] [place], [place] [address]    Special Requests   Final    BOTTLES DRAWN AEROBIC AND ANAEROBIC Blood Culture adequate volume Performed at [facility] [address], [place], [place] [address]    Culture   Final    NO GROWTH 5 DAYS Performed at [facility], [place] [address], [place], [place] [address]    Report Status 07/09/2020 FINAL  Final  Blood Culture (routine x 2)     Status: Abnormal   Collection Time: 07/04/20  8:45 AM   Specimen: BLOOD  Result Value Ref Range Status   Specimen Description   Final    BLOOD LEFT WRIST Performed at [facility] [address] [place], [place] [address]    Special Requests   Final    BOTTLES DRAWN AEROBIC AND ANAEROBIC Blood Culture adequate volume Performed at [facility] [address] [place],  [address]    Culture  Setup Time   Final     AEROBIC BOTTLE ONLY GRAM POSITIVE COCCI Organism ID to follow CRITICAL RESULT CALLED TO, READ BACK BY AND VERIFIED WITH: Shelda Jakes PharmD 8:35 07/05/20 ([email or web address])    Culture (A)  Final    STAPHYLOCOCCUS CAPITIS THE SIGNIFICANCE OF ISOLATING THIS ORGANISM FROM A SINGLE SET OF BLOOD CULTURES  WHEN MULTIPLE SETS ARE DRAWN IS UNCERTAIN. PLEASE NOTIFY THE MICROBIOLOGY DEPARTMENT WITHIN ONE WEEK IF SPECIATION AND SENSITIVITIES ARE REQUIRED. Performed at [facility], [place] [address] [place], [place] [address]    Report Status 07/06/2020 FINAL  Final  Blood Culture ID Panel (Reflexed)     Status: Abnormal   Collection Time: 07/04/20  8:45 AM  Result Value Ref Range Status   Enterococcus faecalis NOT DETECTED NOT DETECTED Final   Enterococcus Faecium NOT DETECTED NOT DETECTED Final   Listeria monocytogenes NOT DETECTED NOT DETECTED Final   Staphylococcus species DETECTED (A) NOT DETECTED Final    Comment: CRITICAL RESULT CALLED TO, READ BACK BY AND VERIFIED WITH: Shelda Jakes PharmD 8:35 07/05/20 ([email or web address])    Staphylococcus aureus (BCID) NOT DETECTED NOT DETECTED Final   Staphylococcus epidermidis NOT DETECTED NOT DETECTED Final   Staphylococcus lugdunensis NOT DETECTED NOT DETECTED Final   Streptococcus species NOT DETECTED NOT DETECTED Final   Streptococcus agalactiae NOT DETECTED NOT DETECTED Final   Streptococcus pneumoniae NOT DETECTED NOT DETECTED Final   Streptococcus pyogenes NOT DETECTED NOT DETECTED Final   A.calcoaceticus-baumannii NOT DETECTED NOT DETECTED Final   Bacteroides fragilis NOT DETECTED NOT DETECTED Final   Enterobacterales NOT DETECTED NOT DETECTED Final   Enterobacter cloacae complex NOT DETECTED NOT DETECTED Final   Escherichia coli NOT DETECTED NOT DETECTED Final   Klebsiella aerogenes NOT DETECTED NOT DETECTED Final   Klebsiella oxytoca NOT DETECTED NOT DETECTED Final   Klebsiella pneumoniae NOT DETECTED NOT DETECTED Final   Proteus species NOT DETECTED NOT  DETECTED Final   Salmonella species NOT DETECTED NOT DETECTED Final   Serratia marcescens NOT DETECTED NOT DETECTED Final   Haemophilus influenzae NOT DETECTED NOT DETECTED Final   Neisseria meningitidis NOT DETECTED NOT DETECTED Final   Pseudomonas aeruginosa NOT DETECTED NOT DETECTED Final   Stenotrophomonas maltophilia NOT DETECTED NOT DETECTED Final   Candida albicans NOT DETECTED NOT DETECTED Final   Candida auris NOT DETECTED NOT DETECTED Final   Candida glabrata NOT DETECTED NOT DETECTED Final   Candida krusei NOT DETECTED NOT DETECTED Final   Candida parapsilosis NOT DETECTED NOT DETECTED Final   Candida tropicalis NOT DETECTED NOT DETECTED Final   Cryptococcus neoformans/gattii NOT DETECTED NOT DETECTED Final    Comment: Performed at [facility], [address] [place], [place] [address]     Labs: Basic Metabolic Panel: Recent Labs  Lab 07/04/20 0759 07/05/20 0447 07/06/20 0425 07/07/20 0349 07/08/20 0405 07/09/20 0404  NA 129* 132*  --   --  131* 132*  K 3.7 3.7  --   --  3.5 3.9  CL 92* 101  --   --  95* 93*  CO2 26 22  --   --  27 29  GLUCOSE 128* 109*  --   --  102* 131*  BUN 22 19  --   --  17 20  CREATININE 0.95 0.65  --   --  0.57* 0.53*  CALCIUM 8.5* 7.8*  --   --  8.2* 8.5*  MG  --  1.9 2.1 2.2  --   --   PHOS  --  2.8 2.8 2.5  --   --    Liver Function Tests: Recent Labs  Lab 07/04/20 0759 07/05/20 0447 07/08/20 0405  AST 55* 56* 42*  ALT 23 23 28   ALKPHOS 55 44 42  BILITOT 1.1 0.6 1.0  PROT 7.0 5.8* 5.8*  ALBUMIN 3.8 3.1* 3.1*  CBC: Recent Labs  Lab 07/04/20 0759 07/05/20 0447  WBC 5.5 5.2  NEUTROABS 4.1  --   HGB 15.4 14.5  HCT 44.2 43.4  MCV 89.8 92.1  PLT 226 237   CBG: No results for input(s): GLUCAP in the last 168 hours. Hgb A1c No results for input(s): HGBA1C in the last 72 hours. Lipid Profile No results for input(s): CHOL, HDL, LDLCALC, TRIG, CHOLHDL, LDLDIRECT in the last 72 hours. Thyroid function studies No  results for input(s): TSH, T4TOTAL, T3FREE, THYROIDAB in the last 72 hours.  Invalid input(s): FREET3 Urinalysis No results found for: COLORURINE, APPEARANCEUR, LABSPEC, PHURINE, GLUCOSEU, HGBUR, BILIRUBINUR, KETONESUR, PROTEINUR, UROBILINOGEN, NITRITE, LEUKOCYTESUR  FURTHER DISCHARGE INSTRUCTIONS:   Get Medicines reviewed and adjusted: Please take all your medications with you for your next visit with your Primary MD   Laboratory/radiological data: Please request your Primary MD to go over all hospital tests and procedure/radiological results at the follow up, please ask your Primary MD to get all Hospital records sent to his/her office.   In some cases, they will be blood work, cultures and biopsy results pending at the time of your discharge. Please request that your primary care M.D. goes through all the records of your hospital data and follows up on these results.   Also Note the following: If you experience worsening of your admission symptoms, develop shortness of breath, life threatening emergency, suicidal or homicidal thoughts you must seek medical attention immediately by calling 911 or calling your MD immediately  if symptoms less severe.   You must read complete instructions/literature along with all the possible adverse reactions/side effects for all the Medicines you take and that have been prescribed to you. Take any new Medicines after you have completely understood and accpet all the possible adverse reactions/side effects.    Do not drive when taking Pain medications or sleeping medications (Benzodaizepines)   Do not take more than prescribed Pain, Sleep and Anxiety Medications. It is not advisable to combine anxiety,sleep and pain medications without talking with your primary care practitioner   Special Instructions: If you have smoked or chewed Tobacco  in the last 2 yrs please stop smoking, stop any regular Alcohol  and or any Recreational drug use.   Wear Seat belts  while driving.   Please note: You were cared for by a hospitalist during your hospital stay. Once you are discharged, your primary care physician will handle any further medical issues. Please note that NO REFILLS for any discharge medications will be authorized once you are discharged, as it is imperative that you return to your primary care physician (or establish a relationship with a primary care physician if you do not have one) for your post hospital discharge needs so that they can reassess your need for medications and monitor your lab values.  Time coordinating discharge: 35 minutes  SIGNED:  Marzetta Board, MD, PhD 07/10/2020, 10:49 AM

## 2020-07-12 DIAGNOSIS — U071 COVID-19: Secondary | ICD-10-CM | POA: Diagnosis not present

## 2020-07-12 DIAGNOSIS — J9621 Acute and chronic respiratory failure with hypoxia: Secondary | ICD-10-CM | POA: Diagnosis not present

## 2020-07-12 DIAGNOSIS — I4891 Unspecified atrial fibrillation: Secondary | ICD-10-CM | POA: Diagnosis not present

## 2020-07-12 DIAGNOSIS — J449 Chronic obstructive pulmonary disease, unspecified: Secondary | ICD-10-CM | POA: Diagnosis not present

## 2020-07-12 DIAGNOSIS — J1282 Pneumonia due to coronavirus disease 2019: Secondary | ICD-10-CM | POA: Diagnosis not present

## 2020-07-12 DIAGNOSIS — R0600 Dyspnea, unspecified: Secondary | ICD-10-CM | POA: Diagnosis not present

## 2020-07-15 ENCOUNTER — Telehealth: Payer: Self-pay | Admitting: *Deleted

## 2020-07-15 DIAGNOSIS — J449 Chronic obstructive pulmonary disease, unspecified: Secondary | ICD-10-CM | POA: Diagnosis not present

## 2020-07-15 NOTE — Telephone Encounter (Signed)
Orders placed via [employer] website.

## 2020-07-15 NOTE — Telephone Encounter (Signed)
Okay to send orders, send discharge from hospital

## 2020-07-15 NOTE — Telephone Encounter (Signed)
Call placed to patient and patient made aware.  

## 2020-07-15 NOTE — Telephone Encounter (Signed)
Received call from patient.   Reports that he has been released from hospital with Dx of COVID PNA. States that he has hospital bed, but requires new topper for mattress as it is very uncomfortable and is also requesting over bed table. Reports that DME company is [employer].   Ok to order?

## 2020-07-16 DIAGNOSIS — J449 Chronic obstructive pulmonary disease, unspecified: Secondary | ICD-10-CM | POA: Diagnosis not present

## 2020-07-16 DIAGNOSIS — M6281 Muscle weakness (generalized): Secondary | ICD-10-CM | POA: Diagnosis not present

## 2020-07-22 ENCOUNTER — Inpatient Hospital Stay: Payer: Self-pay | Admitting: Family Medicine

## 2020-08-02 ENCOUNTER — Inpatient Hospital Stay: Payer: Self-pay | Admitting: Family Medicine

## 2020-08-05 ENCOUNTER — Encounter: Payer: Self-pay | Admitting: Family Medicine

## 2020-08-05 ENCOUNTER — Ambulatory Visit (INDEPENDENT_AMBULATORY_CARE_PROVIDER_SITE_OTHER): Payer: Medicare HMO | Admitting: Family Medicine

## 2020-08-05 ENCOUNTER — Other Ambulatory Visit: Payer: Self-pay

## 2020-08-05 VITALS — BP 126/84 | HR 76 | Temp 98.4°F | Resp 18

## 2020-08-05 DIAGNOSIS — R29898 Other symptoms and signs involving the musculoskeletal system: Secondary | ICD-10-CM

## 2020-08-05 DIAGNOSIS — E43 Unspecified severe protein-calorie malnutrition: Secondary | ICD-10-CM | POA: Diagnosis not present

## 2020-08-05 DIAGNOSIS — E785 Hyperlipidemia, unspecified: Secondary | ICD-10-CM | POA: Diagnosis not present

## 2020-08-05 DIAGNOSIS — R32 Unspecified urinary incontinence: Secondary | ICD-10-CM | POA: Diagnosis not present

## 2020-08-05 DIAGNOSIS — R2990 Unspecified symptoms and signs involving the nervous system: Secondary | ICD-10-CM | POA: Diagnosis not present

## 2020-08-05 DIAGNOSIS — J9611 Chronic respiratory failure with hypoxia: Secondary | ICD-10-CM | POA: Diagnosis not present

## 2020-08-05 DIAGNOSIS — Z23 Encounter for immunization: Secondary | ICD-10-CM

## 2020-08-05 DIAGNOSIS — I4891 Unspecified atrial fibrillation: Secondary | ICD-10-CM

## 2020-08-05 DIAGNOSIS — J449 Chronic obstructive pulmonary disease, unspecified: Secondary | ICD-10-CM

## 2020-08-05 DIAGNOSIS — M6281 Muscle weakness (generalized): Secondary | ICD-10-CM

## 2020-08-05 MED ORDER — LORAZEPAM 0.5 MG PO TABS: 0.5000 mg | ORAL_TABLET | Freq: Two times a day (BID) | ORAL | 1 refills | Status: DC | PRN

## 2020-08-05 NOTE — Patient Instructions (Addendum)
Lung appointment to be scheduled Try the beztri We will call with lab results  Higher Bedside commode sent  Flu shot given  MRI to be done Ativan as needed twice a day for anxiety F/U 2 months

## 2020-08-05 NOTE — Progress Notes (Signed)
Subjective:    Patient ID: Zachary Chambers, male    DOB: 1951-01-26, 69 y.o.   MRN: [id number]  Patient presents for Hospital FU (COVID PNA)  Patient here for hospital follow-up.  GF Zachary Chambers with him today  He was admitted to the hospital in November for about a week secondary he acute respiratory failure.  He has underlying COPD on 2 L of oxygen.  Been at home with 2 For symptoms but has not alerted pulmonary or myself.  At the time of the admission was also unvaccinated for Covid.  He was Covid positive on 11/18.  He was started on antivirals along with steroids.  His respiratory status further returned back to his baseline. Protein malnutrition his prealbumin was low was recommended he try to increase his protein intake.  He eats one good meal a day, doesn't like ensure He has been so weak that he can barely make it to the bathroom before he starts to urinate on himself.  He needs higher bedside commode as it is too low for him to push off of with his breathing.  He is also finding himself being very anxious.  He feels claustrophobic even with trying to take a shower.  When his breathing starts to accept he also gets very anxious.  He and his girlfriend also noted that he has had right sided weakness since he went to the hospital.  He is unable to eat use his right arm like he did before he also has weakness in his right leg.  He is unable to grasp or feed himself with the right side.  I did not see any notation of this while he was in the hospital for Covid.  Atrial fibrillation - he had transient A. fib noted on monitor stop if this is secondary to his acute illness.   He was Discharged home with PT/OT.  He was also sent home with a hospital bed and a walker.  He has not smoked in 4 months  , only 2 beers since discharge home   He has not had Flu shot    Review Of Systems:  GEN- denies fatigue, fever, weight loss,weakness, recent illness HEENT- denies eye drainage, change in  vision, nasal discharge, CVS- denies chest pain, palpitations RESP- denies SOB, cough, wheeze ABD- denies N/V, change in stools, abd pain GU- denies dysuria, hematuria, dribbling, incontinence MSK- denies joint pain, muscle aches, injury Neuro- denies headache, dizziness, syncope, seizure activity        Objective:    BP 126/84   Pulse 76   Temp 98.4 F (36.9 C) (Temporal)   Resp 18   SpO2 94% Comment: 2Lmin via [place] GEN- NAD, alert and oriented x3, thin elderly male, weak appearing, sitting wheelchair  HEENT- PERRL, EOMI, non injected sclera, pink conjunctiva, MMM, oropharynx clear Neck- Supple, no thryomegaly CVS- RRR, no murmur, distant sounds RESP-CTAB ABD-NABS,soft,NT,ND EXT- pedal  Edema bilat, above ankles  NEURO-CNII-XII in tact, decreased strength RUE compared to left, decreased grasp right hand, able to make fist, fair ROM bilat LE, decreased strength in right hip flexor compared to left Pulses- Radial 2+       Assessment & Plan:      Problem List Items Addressed This Visit      Unprioritized   Chronic respiratory failure (HCC)   Relevant Orders   CBC with Differential/Platelet   COPD (chronic obstructive pulmonary disease) ([place]) - Primary    He is still on 2 L of  oxygen.  I have given her samples of [name], since his insurance is not covering this.  He will continue his other medications as prescribed.  Follow-up to be scheduled with his pulmonologist.      Relevant Orders   CBC with Differential/Platelet   Dyslipidemia (Chronic)   Protein-calorie malnutrition, severe    He has lost weight with some muscle wasting noted.  He is does not like to drink Ensure or boost.  Discussed other ways he can get protein in.  Unfortunately this recent COVID-19 infection has taken a significant amount of his energy and calories.  I am also concerned about strokelike symptoms with weakness in his right upper extremity and lower extremity.  He had transient episodes of  atrial fibrillation with which could have been a nidus for this.  Will obtain MRI of the brain  Situational anxiety noted.  We will give him lorazepam 0.5 mg twice a day to help calm his breathing he will also use this prior to his MRI   He is scheduled for home health physical therapy occupational therapy.  Urinary incontinence no UTI symptoms.  Patient is not able to get to the bathroom fast enough.  Will obtain bedside commode with extension      Relevant Orders   CBC with Differential/Platelet   Comprehensive metabolic panel   Transient atrial fibrillation (HCC)   Relevant Orders   MR Brain Wo Contrast    Other Visit Diagnoses    Urinary incontinence, unspecified type       Stroke-like symptom       Relevant Orders   MR Brain Wo Contrast   Muscle weakness of right upper extremity       Relevant Orders   MR Brain Wo Contrast   Need for immunization against influenza       Relevant Orders   Flu Vaccine QUAD High Dose(Fluad) (Completed)   Weakness of right lower extremity       Relevant Orders   MR Brain Wo Contrast      Note: This dictation was prepared with Dragon dictation along with smaller phrase technology. Any transcriptional errors that result from this process are unintentional.

## 2020-08-05 NOTE — Assessment & Plan Note (Signed)
He has lost weight with some muscle wasting noted.  He is does not like to drink Ensure or boost.  Discussed other ways he can get protein in.  Unfortunately this recent COVID-19 infection has taken a significant amount of his energy and calories.  I am also concerned about strokelike symptoms with weakness in his right upper extremity and lower extremity.  He had transient episodes of atrial fibrillation with which could have been a nidus for this.  Will obtain MRI of the brain  Situational anxiety noted.  We will give him lorazepam 0.5 mg twice a day to help calm his breathing he will also use this prior to his MRI   He is scheduled for home health physical therapy occupational therapy.  Urinary incontinence no UTI symptoms.  Patient is not able to get to the bathroom fast enough.  Will obtain bedside commode with extension

## 2020-08-05 NOTE — Assessment & Plan Note (Signed)
He is still on 2 L of oxygen.  I have given her samples of Zachary Chambers, since his insurance is not covering this.  He will continue his other medications as prescribed.  Follow-up to be scheduled with his pulmonologist.

## 2020-08-06 LAB — CBC WITH DIFFERENTIAL/PLATELET
Absolute Monocytes: 653 cells/uL (ref 200–950)
Basophils Absolute: 61 cells/uL (ref 0–200)
Basophils Relative: 1 %
Eosinophils Absolute: 122 cells/uL (ref 15–500)
Eosinophils Relative: 2 %
HCT: 36.7 % — ABNORMAL LOW (ref 38.5–50.0)
Hemoglobin: 12.2 g/dL — ABNORMAL LOW (ref 13.2–17.1)
Lymphs Abs: 866 cells/uL (ref 850–3900)
MCH: 29.3 pg (ref 27.0–33.0)
MCHC: 33.2 g/dL (ref 32.0–36.0)
MCV: 88 fL (ref 80.0–100.0)
MPV: 10.1 fL (ref 7.5–12.5)
Monocytes Relative: 10.7 %
Neutro Abs: 4398 cells/uL (ref 1500–7800)
Neutrophils Relative %: 72.1 %
Platelets: 372 10*3/uL (ref 140–400)
RBC: 4.17 10*6/uL — ABNORMAL LOW (ref 4.20–5.80)
RDW: 13.3 % (ref 11.0–15.0)
Total Lymphocyte: 14.2 %
WBC: 6.1 10*3/uL (ref 3.8–10.8)

## 2020-08-06 LAB — COMPREHENSIVE METABOLIC PANEL
AG Ratio: 1.4 (calc) (ref 1.0–2.5)
ALT: 9 U/L (ref 9–46)
AST: 16 U/L (ref 10–35)
Albumin: 3.6 g/dL (ref 3.6–5.1)
Alkaline phosphatase (APISO): 94 U/L (ref 35–144)
BUN/Creatinine Ratio: 11 (calc) (ref 6–22)
BUN: 7 mg/dL (ref 7–25)
CO2: 31 mmol/L (ref 20–32)
Calcium: 8.7 mg/dL (ref 8.6–10.3)
Chloride: 99 mmol/L (ref 98–110)
Creat: 0.61 mg/dL — ABNORMAL LOW (ref 0.70–1.18)
Globulin: 2.5 g/dL (calc) (ref 1.9–3.7)
Glucose, Bld: 84 mg/dL (ref 65–99)
Potassium: 3.6 mmol/L (ref 3.5–5.3)
Sodium: 141 mmol/L (ref 135–146)
Total Bilirubin: 0.6 mg/dL (ref 0.2–1.2)
Total Protein: 6.1 g/dL (ref 6.1–8.1)

## 2020-08-11 DIAGNOSIS — I4891 Unspecified atrial fibrillation: Secondary | ICD-10-CM | POA: Diagnosis not present

## 2020-08-11 DIAGNOSIS — R0600 Dyspnea, unspecified: Secondary | ICD-10-CM | POA: Diagnosis not present

## 2020-08-11 DIAGNOSIS — U071 COVID-19: Secondary | ICD-10-CM | POA: Diagnosis not present

## 2020-08-11 DIAGNOSIS — J449 Chronic obstructive pulmonary disease, unspecified: Secondary | ICD-10-CM | POA: Diagnosis not present

## 2020-08-11 DIAGNOSIS — J1282 Pneumonia due to coronavirus disease 2019: Secondary | ICD-10-CM | POA: Diagnosis not present

## 2020-08-11 DIAGNOSIS — J9621 Acute and chronic respiratory failure with hypoxia: Secondary | ICD-10-CM | POA: Diagnosis not present

## 2020-08-14 DIAGNOSIS — J449 Chronic obstructive pulmonary disease, unspecified: Secondary | ICD-10-CM | POA: Diagnosis not present

## 2020-08-15 DIAGNOSIS — J449 Chronic obstructive pulmonary disease, unspecified: Secondary | ICD-10-CM | POA: Diagnosis not present

## 2020-08-15 DIAGNOSIS — M6281 Muscle weakness (generalized): Secondary | ICD-10-CM | POA: Diagnosis not present

## 2020-08-21 ENCOUNTER — Ambulatory Visit: Payer: Medicare HMO | Admitting: Physical Therapy

## 2020-08-29 ENCOUNTER — Other Ambulatory Visit: Payer: Self-pay

## 2020-08-29 ENCOUNTER — Telehealth: Payer: Self-pay | Admitting: *Deleted

## 2020-08-29 ENCOUNTER — Ambulatory Visit
Admission: RE | Admit: 2020-08-29 | Discharge: 2020-08-29 | Disposition: A | Discharge status: Home / Self Care | Payer: Medicare HMO | Source: Ambulatory Visit | Attending: Family Medicine | Admitting: Family Medicine

## 2020-08-29 ENCOUNTER — Other Ambulatory Visit: Payer: Self-pay | Admitting: Family Medicine

## 2020-08-29 DIAGNOSIS — R29898 Other symptoms and signs involving the musculoskeletal system: Secondary | ICD-10-CM

## 2020-08-29 DIAGNOSIS — I6389 Other cerebral infarction: Secondary | ICD-10-CM | POA: Diagnosis not present

## 2020-08-29 DIAGNOSIS — I6782 Cerebral ischemia: Secondary | ICD-10-CM | POA: Diagnosis not present

## 2020-08-29 DIAGNOSIS — M6281 Muscle weakness (generalized): Secondary | ICD-10-CM

## 2020-08-29 DIAGNOSIS — I4891 Unspecified atrial fibrillation: Secondary | ICD-10-CM

## 2020-08-29 DIAGNOSIS — R2990 Unspecified symptoms and signs involving the nervous system: Secondary | ICD-10-CM

## 2020-08-29 MED ORDER — DIAZEPAM 10 MG PO TABS: 10.0000 mg | ORAL_TABLET | Freq: Two times a day (BID) | ORAL | 0 refills | Status: DC | PRN

## 2020-08-29 NOTE — Telephone Encounter (Signed)
I sent in valium

## 2020-08-29 NOTE — Telephone Encounter (Signed)
Received call from patient.   Reports that he has imaging of brain today and is requesting anxiety medication for claustrophobia.   Reports that appointment for imaging has been scheduled and re-scheduled so he apologizes for the short notice.   Routed to fellow MD as PCP is out of office.

## 2020-08-29 NOTE — Telephone Encounter (Signed)
Call placed to patient and patient made aware.  

## 2020-08-30 ENCOUNTER — Other Ambulatory Visit: Payer: Self-pay | Admitting: Family Medicine

## 2020-08-30 ENCOUNTER — Encounter: Payer: Self-pay | Admitting: Neurology

## 2020-08-30 DIAGNOSIS — R29898 Other symptoms and signs involving the musculoskeletal system: Secondary | ICD-10-CM

## 2020-08-30 DIAGNOSIS — I4891 Unspecified atrial fibrillation: Secondary | ICD-10-CM

## 2020-08-30 DIAGNOSIS — M6281 Muscle weakness (generalized): Secondary | ICD-10-CM

## 2020-08-30 DIAGNOSIS — I639 Cerebral infarction, unspecified: Secondary | ICD-10-CM

## 2020-08-30 MED ORDER — APIXABAN 2.5 MG PO TABS: 2.5000 mg | ORAL_TABLET | Freq: Two times a day (BID) | ORAL | 2 refills | Status: DC

## 2020-08-30 NOTE — Progress Notes (Signed)
MRI results from recent CVA symptoms IMPRESSION: Background pattern of age related volume loss. Moderate to severe chronic small-vessel ischemic changes throughout the brain. 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 No evidence of hemorrhage or mass effect.  The patient and his family had not noted any recent clinical change. Therefore, I made the decision to call this report in the morning rather than after hours, as the finding does not appear critical.  Since pt had AFIB transient suspect emobolic stroke, start eliquis 2.5mg  BID weight  < 60kg Referral to neurology

## 2020-09-09 ENCOUNTER — Ambulatory Visit: Payer: Medicare HMO

## 2020-09-10 ENCOUNTER — Encounter: Payer: Self-pay | Admitting: Physical Therapy

## 2020-09-10 ENCOUNTER — Ambulatory Visit: Payer: Medicare HMO | Attending: Internal Medicine | Admitting: Physical Therapy

## 2020-09-10 ENCOUNTER — Other Ambulatory Visit: Payer: Self-pay

## 2020-09-10 DIAGNOSIS — M6281 Muscle weakness (generalized): Secondary | ICD-10-CM | POA: Insufficient documentation

## 2020-09-10 NOTE — Therapy (Signed)
[facility] [address], [place], [place], [address] Phone: [phone]   Fax:  [phone]  Physical Therapy Evaluation and Discharge Summary  Patient Details  Name: Zachary Chambers MRN: [id number] Date of Birth: 06/06/1951 Referring Provider (PT): Marzetta Board MD   Encounter Date: 09/10/2020   PT End of Session - 09/10/20 1112    Visit Number 1    Number of Visits --    Date for PT Re-Evaluation 09/10/20    Authorization Type [employer] MCR    Authorization - Visit Number 1    Authorization - Number of Visits --    Progress Note Due on Visit --    PT Start Time 1115    PT Stop Time 1149    PT Time Calculation (min) 34 min    Equipment Utilized During Treatment Oxygen    Activity Tolerance Patient tolerated treatment well    Behavior During Therapy [facility] for tasks assessed/performed           Past Medical History:  Diagnosis Date  . COPD (chronic obstructive pulmonary disease) (HCC)    not on home O2  . DJD (degenerative joint disease)    diffusely  . GERD (gastroesophageal reflux disease)   . Hyperlipidemia   . MI (myocardial infarction) ([place]) 2006   small MI and stents x 3  Dr. Doreatha Lew  . PUD (peptic ulcer disease)    with bleeding  . Rotator cuff disorder    has been evaluated by Dr Clifton James and Koleen Distance    Past Surgical History:  Procedure Laterality Date  . COLONOSCOPY    . EYE SURGERY     right eye removed at age 3  . heart stent     x3- 2006    There were no vitals filed for this visit.    Subjective Assessment - 09/10/20 1119    Subjective Patient was 15 min late to appt. CVA 08/05/20. Weakness in right hand, Rt arm tingles. Legs feel weak. Patient says he's basically bedridden. He has no motivation to get up because he can't go anywhere. He gets up 3-4 x/day to walk 30 feet to kitchen. He states if he take a second to steady himself when he stands up he can walk safely without AD around  his home.    Pertinent History CVA 07/2019, COPD, 2 L O2, RC tear right shoulder (with limited use)    Limitations Lifting;Writing    Patient Stated Goals to write his name, open a bottle    Currently in Pain? No/denies              [facility] PT Assessment - 09/10/20 0001      Assessment   Medical Diagnosis other abnormalities of gait and mobility R2.89; Physical Deconditioning R53.81    Referring Provider (PT) Marzetta Board MD    Onset Date/Surgical Date 06/17/20    Hand Dominance Right      Precautions   Precautions Fall    Precaution Comments on 2L O2      Restrictions   Weight Bearing Restrictions No      Balance Screen   Has the patient fallen in the past 6 months Yes    How many times? 1    Has the patient had a decrease in activity level because of a fear of falling?  No    Is the patient reluctant to leave their home because of a fear of falling?  No  Home Environment   Living Environment Private residence    Living Arrangements Spouse/significant other    Home Access Ramped entrance      Prior Function   Level of Bernie device for independence;Needs assistance with ADLs    Dressing Unable to assess    Grooming Unaable to assess    Comments self reported      Cognition   Overall Cognitive Status Within Functional Limits for tasks assessed      Coordination   Gross Motor Movements are Fluid and Coordinated Yes    Fine Motor Movements are Fluid and Coordinated Yes   can approximate fingers; difficulty to digit 5     Functional Tests   Functional tests Sit to Stand      Sit to Stand   Comments 15 sec      Posture/Postural Control   Posture/Postural Control Postural limitations    Postural Limitations Rounded Shoulders;Forward head;Increased thoracic kyphosis;Posterior pelvic tilt      ROM / Strength   AROM / PROM / Strength AROM;Strength      AROM   Overall AROM Comments Left shoulder WFL; Rt shoulder flex A/PROM 75/95  deg; elbow WNL; Bil LE WFL      Strength   Overall Strength Comments Rt shoulder flex 2+/5, ext 5/5; elbow flex 4+/5, ext 5/5; wrist flex/ext 5/5; Left shoulder flex/ABD 4+/5, else Lt UE grossly 5/5    Strength Assessment Site Hand    Right/Left hand Right;Left    Right Hand Grip (lbs) 19    Left Hand Grip (lbs) 34      Ambulation/Gait   Ambulation/Gait Yes    Ambulation/Gait Assistance 6: Modified independent (Device/Increase time)   also required assist with O2 tank   Ambulation Distance (Feet) 50 Feet    Assistive device Straight cane    Gait Pattern Step-through pattern;Decreased dorsiflexion - right;Decreased dorsiflexion - left    Ambulation Surface Level      Balance   Balance Assessed Yes      Standardized Balance Assessment   Standardized Balance Assessment Berg Balance Test      Berg Balance Test   Sit to Stand Able to stand without using hands and stabilize independently    Standing Unsupported Able to stand safely 2 minutes    Sitting with Back Unsupported but Feet Supported on Floor or Stool Able to sit safely and securely 2 minutes    Stand to Sit Sits safely with minimal use of hands    Transfers Able to transfer safely, minor use of hands    Standing Unsupported with Eyes Closed Able to stand 3 seconds    Standing Unsupported with Feet Together Able to place feet together independently and stand 1 minute safely    From Standing, Reach Forward with Outstretched Arm Can reach forward >12 cm safely (5")    From Standing Position, Pick up Object from Floor Able to pick up shoe safely and easily    From Standing Position, Turn to Look Behind Over each Shoulder Looks behind from both sides and weight shifts well    Turn 360 Degrees Able to turn 360 degrees safely in 4 seconds or less    Standing Unsupported, Alternately Place Feet on Step/Stool Needs assistance to keep from falling or unable to try    Berg comment: unable to finish test due to breathing difficulties and  limited time  Objective measurements completed on examination: See above findings.                            Plan - 09/10/20 1742    Clinical Impression Statement Patient arrived to his appt 15 min late. He uses 2.0 L of O2 and amb with a SPC requiring assist of PT to pull O2. Patient's main complaint today was that he cannot write and use his hand to open bottles and other ADLs. His grip strenth in his right dominant hand is 19# compared to 34# in the left hand.  He reports some general weakness in bil LEs but is grossly 5/5 with MMT. He has marked weakness and limited use of his right shoulder above 60-70 degrees due to a RC tear which he was told requires a shoulder replacement. His overall strength in his upper and lower extremities is quite good otherwise. His 5x sit to stand is 15 sec which does indicate fall risk and he reports one fall in the past 6 months.  He reports that he spends most of his day in bed because he can't go anywhere alone. He does get up 3-4 x/daily to walk to the kitchen which he can do without AD per his report. He does need assist with bathing and dressing. After discussing patients goals for therapy, I feel he would be more appropriate for OT at this time. He may also be more appropriate for HHOT due to the effort involved to get to therapy. He did require frequent rests due to COPD during the short eval and I was unable to complete the BERG Balance Assessment.    Personal Factors and Comorbidities Comorbidity 3+    Comorbidities CVA 07/2019, COPD, 2 L O2, RC tear right shoulder (with limited use)    Examination-Activity Limitations Bathing;Lift;Dressing    Stability/Clinical Decision Making Evolving/Moderate complexity    Clinical Decision Making Low    Rehab Potential Good    PT Frequency One time visit    PT Next Visit Plan Pt discharged due to personal goals for therapy being more appropriate for OT.            Patient will benefit from skilled therapeutic intervention in order to improve the following deficits and impairments:  Decreased strength,Postural dysfunction,Impaired UE functional use,Decreased balance,Decreased range of motion,Cardiopulmonary status limiting activity  Visit Diagnosis: Muscle weakness (generalized)     Problem List Patient Active Problem List   Diagnosis Date Noted  . Pneumonia due to COVID-19 virus 07/05/2020  . Chronic respiratory failure ([place]) 07/05/2020  . Transient atrial fibrillation ([place]) 07/05/2020  . Dyspnea 07/04/2020  . Protein-calorie malnutrition, severe 03/07/2020  . S/P coronary artery stent placement   . Chest pain 03/06/2020  . Dyslipidemia 03/06/2020  . Healthcare maintenance 03/05/2020  . Heavy alcohol use 03/04/2018  . Memory changes 03/04/2018  . Leukopenia 05/04/2017  . Bruit 01/22/2014  . Muscle spasm of back 09/20/2013  . COPD (chronic obstructive pulmonary disease) ([place]) 01/14/2011  . CAD (coronary artery disease) 01/14/2011  . GERD (gastroesophageal reflux disease) 01/14/2011  . TOBACCO ABUSE 07/25/2009  . Osteoarthritis 07/25/2009  . ROTATOR CUFF SYNDROME, RIGHT 07/25/2009    Madelyn Flavors PT 09/10/2020, 6:13 PM  [facility] [address], [place], [place], [address] Phone: [phone]   Fax:  [phone]  Name: Zachary Chambers MRN: [id number] Date of Birth: Jun 17, 1951

## 2020-09-11 ENCOUNTER — Telehealth: Payer: Self-pay | Admitting: Family Medicine

## 2020-09-11 DIAGNOSIS — R0600 Dyspnea, unspecified: Secondary | ICD-10-CM | POA: Diagnosis not present

## 2020-09-11 DIAGNOSIS — J449 Chronic obstructive pulmonary disease, unspecified: Secondary | ICD-10-CM | POA: Diagnosis not present

## 2020-09-11 DIAGNOSIS — I4891 Unspecified atrial fibrillation: Secondary | ICD-10-CM | POA: Diagnosis not present

## 2020-09-11 DIAGNOSIS — I639 Cerebral infarction, unspecified: Secondary | ICD-10-CM

## 2020-09-11 DIAGNOSIS — U071 COVID-19: Secondary | ICD-10-CM | POA: Diagnosis not present

## 2020-09-11 DIAGNOSIS — M6281 Muscle weakness (generalized): Secondary | ICD-10-CM

## 2020-09-11 DIAGNOSIS — J9621 Acute and chronic respiratory failure with hypoxia: Secondary | ICD-10-CM | POA: Diagnosis not present

## 2020-09-11 DIAGNOSIS — J1282 Pneumonia due to coronavirus disease 2019: Secondary | ICD-10-CM | POA: Diagnosis not present

## 2020-09-11 NOTE — Telephone Encounter (Signed)
Pt had a referral to see a Doctor Caren Griffins office call pt need OT instead of PT for what Cru wants to start writing his name along with using his hands so with that been said Esdras need a referral to see a OT

## 2020-09-11 NOTE — Progress Notes (Unsigned)
NEUROLOGY CONSULTATION NOTE  RACHID PARHAM MRN: [id number] DOB: 1950/10/27  Referring provider: Vic Blackbird, MD Primary care provider: Vic Blackbird, MD  Reason for consult:  stroke   Subjective:  Zachary Chambers is a 70 year old right-handed male with COPD, CAD s/p MI and HLD who presents for stroke.  History supplemented by hospital and referring provider's notes.  He is accompanied by his significant other who also provides collateral history.    H was admitted to [facility] in November for acute respiratory failure secondary to COVID pneumonia.  D-dimer was 4.02 which trended down to 0.79 by discharge.  He had transient atrial fibrillation noted on telemetry believed to be secondary to acute illness.  Since hospitalization, he has had right sided weakness.  He reports difficulty lifting his right arm and leg.  He followed up with his PCP.  MRI of brain was performed on 08/29/2020, which showed a 1.5 cm acute to early subacute infarct affecting the cortical and subcortical left vertex frontoparietal junction.  Moderate to severe chronic small vessel ischemic changes also noted.  Given that he had a fib in the hospital, his PCP started him on Eliquis.  He takes atorvastatin 40mg  daily for hyperlipidemia.  LDL from July 2021 was 61.  Hgb A1c at that time was 5.4.  2D echocardiogram from July 2021 showed EF 55-60% with no cardiac source of emboli.  Since discharge from the hospital, he reports recurrent bowel incontinence.     PAST MEDICAL HISTORY: Past Medical History:  Diagnosis Date  . COPD (chronic obstructive pulmonary disease) (HCC)    not on home O2  . DJD (degenerative joint disease)    diffusely  . GERD (gastroesophageal reflux disease)   . Hyperlipidemia   . MI (myocardial infarction) ([place]) 2006   small MI and stents x 3  Dr. Doreatha Lew  . PUD (peptic ulcer disease)    with bleeding  . Rotator cuff disorder    has been evaluated by Dr Clifton James and [facility]    PAST  SURGICAL HISTORY: Past Surgical History:  Procedure Laterality Date  . COLONOSCOPY    . EYE SURGERY     right eye removed at age 5  . heart stent     x3- 2006    MEDICATIONS: Current Outpatient Medications on File Prior to Visit  Medication Sig Dispense Refill  . albuterol (PROVENTIL) (2.5 MG/3ML) 0.083% nebulizer solution Take 3 mLs (2.5 mg total) by nebulization every 4 (four) hours as needed for wheezing or shortness of breath. 180 mL 4  . albuterol (VENTOLIN HFA) 108 (90 Base) MCG/ACT inhaler INHALE 2 PUFFS INTO THE LUNGS EVERY 4 HOURS AS NEEDED FOR WHEEZING AND SHORTNESS OF BREATH (Patient taking differently: Inhale 2 puffs into the lungs every 4 (four) hours as needed for wheezing or shortness of breath.) 18 g 3  . apixaban (ELIQUIS) 2.5 MG TABS tablet Take 1 tablet (2.5 mg total) by mouth 2 (two) times daily. 60 tablet 2  . aspirin 81 MG tablet Take 81 mg by mouth at bedtime.    Marland Kitchen atorvastatin (LIPITOR) 40 MG tablet TAKE 1 TABLET BY MOUTH NIGHTLY AT BEDTIME (Patient taking differently: Take 40 mg by mouth daily.) 90 tablet 0  . Budeson-Glycopyrrol-Formoterol (BREZTRI AEROSPHERE) 160-9-4.8 MCG/ACT AERO Inhale 2 puffs into the lungs 2 (two) times daily. 10.7 g 6  . budesonide-formoterol (SYMBICORT) 160-4.5 MCG/ACT inhaler Inhale 2 puffs into the lungs 2 (two) times daily. 1 Inhaler 5  . celecoxib (  CELEBREX) 100 MG capsule Take 100 mg by mouth in the morning and at bedtime.    . diazepam (VALIUM) 10 MG tablet Take 1 tablet (10 mg total) by mouth every 12 (twelve) hours as needed for anxiety (take 30 minutes before mri.). 1 tablet 0  . fluticasone (FLONASE) 50 MCG/ACT nasal spray USE 2 SPRAYS in EACH NOSTRIL EVERY DAY (Patient taking differently: Place 2 sprays into both nostrils daily.) 16 g 6  . folic acid (FOLVITE) 1 MG tablet TAKE 1 TABLET BY MOUTH EVERY DAY (Patient taking differently: Take 1 mg by mouth daily.) 90 tablet 0  . Glucosamine HCl (GLUCOSAMINE PO) Take 1,500 mg by mouth  daily.    . hydrOXYzine (ATARAX/VISTARIL) 10 MG tablet TAKE 1 TABLET BY MOUTH 3 TIMES DAILY AS NEEDED (Patient taking differently: Take 10 mg by mouth as needed for anxiety.) 30 tablet 1  . LORazepam (ATIVAN) 0.5 MG tablet Take 1 tablet (0.5 mg total) by mouth 2 (two) times daily as needed for anxiety. 60 tablet 1  . Multiple Vitamin (MULTIVITAMIN WITH MINERALS) TABS tablet Take 1 tablet by mouth daily. 90 tablet 0  . nitroGLYCERIN (NITROSTAT) 0.4 MG SL tablet dissolve 1 TABLET UNDER THE TONGUE every 5 minutes AS NEEDED CHEST PAIN (Patient taking differently: Place 0.4 mg under the tongue every 5 (five) minutes as needed for chest pain.) 25 tablet 0  . omeprazole (PRILOSEC) 40 MG capsule TAKE 1 CAPSULE BY MOUTH EVERY DAY (Patient taking differently: Take 40 mg by mouth daily.) 90 capsule 3  . tiotropium (SPIRIVA HANDIHALER) 18 MCG inhalation capsule Place 1 capsule (18 mcg total) into inhaler and inhale daily. 30 capsule 5  . tiZANidine (ZANAFLEX) 4 MG tablet TAKE 1 TABLET BY MOUTH EVERY EIGHT HOURS AS NEEDED FOR MUSCLE SPASMS (Patient taking differently: Take 4 mg by mouth every 8 (eight) hours as needed for muscle spasms.) 90 tablet 0  . traMADol (ULTRAM) 50 MG tablet TAKE 1 TABLET BY MOUTH EVERY 6 HOURS AS NEEDED FOR moderate PAIN (Patient taking differently: Take 50 mg by mouth every 6 (six) hours as needed for moderate pain.) 30 tablet 0  . zinc gluconate 50 MG tablet Take 50 mg by mouth daily.     No current facility-administered medications on file prior to visit.    ALLERGIES: No Known Allergies  FAMILY HISTORY: Family History  Problem Relation Age of Onset  . Ovarian cancer Mother   . Diabetes Maternal Grandfather   . COPD Father   . Diabetes Paternal Grandfather   . Colon cancer Paternal Grandfather   . Esophageal cancer Neg Hx   . Rectal cancer Neg Hx   . Stomach cancer Neg Hx     SOCIAL HISTORY: Social History   Socioeconomic History  . Marital status: Single    Spouse  name: Not on file  . Number of children: Not on file  . Years of education: Not on file  . Highest education level: Not on file  Occupational History  . Occupation: retired  Tobacco Use  . Smoking status: Former Smoker    Packs/day: 1.00    Years: 40.00    Pack years: 40.00    Types: Cigarettes    Quit date: 1945    Years since quitting: 77.1  . Smokeless tobacco: Never Used  . Tobacco comment: declines patch  Vaping Use  . Vaping Use: Never used  Substance and Sexual Activity  . Alcohol use: Yes    Alcohol/week: 30.0 standard drinks  Types: 30 Cans of beer per week    Comment: h/o heavy use, now 4 beers per day  . Drug use: Not Currently  . Sexual activity: Yes  Other Topics Concern  . Not on file  Social History Narrative  . Not on file   Social Determinants of Health   Financial Resource Strain: Not on file  Food Insecurity: Not on file  Transportation Needs: Not on file  Physical Activity: Not on file  Stress: Not on file  Social Connections: Not on file  Intimate Partner Violence: Not on file    Objective:  Blood pressure (!) 166/82, pulse (!) 117, height 5\' 8"  (1.727 m), weight 129 lb (58.5 kg), SpO2 (!) 85 %. General: No acute distress.  Gaunt.  Head:  Normocephalic/atraumatic Eyes:  fundi examined but not visualized Neck: supple, no paraspinal tenderness, full range of motion Back: No paraspinal tenderness Heart: regular rate and rhythm Lungs: Clear to auscultation bilaterally. Vascular: No carotid bruits. Neurological Exam: Mental status: alert and oriented to person, place, and time, recent and remote memory intact, fund of knowledge intact, attention and concentration intact, speech fluent and not dysarthric, language intact. Cranial nerves: CN I: not tested CN II: Prosthetic right eye.  left eye pupil round and reactive to light, visual field intact CN III, IV, VI:  full range of motion of left eye, no nystagmus, no ptosis CN V: facial sensation  intact. CN VII: upper and lower face symmetric CN VIII: hearing intact CN IX, X: gag intact, uvula midline CN XI: sternocleidomastoid and trapezius muscles intact CN XII: tongue midline Bulk & Tone: generalized muscle atrophy, no fasciculations. Motor:  muscle strength 4+/5 right upper extremity and right proximal lower extremity, otherwise 5/5 throughout Sensation:  Pinprick sensation reduced in right upper and lower extremities, vibratory sensation reduced in lower extremities Deep Tendon Reflexes:  3+ right upper and lower extremities, 2+ left upper and lower extremities,  toes downgoing.   Finger to nose testing:  Without dysmetria.   Gait:  Unsteady gait.  Requires walker.  Romberg with sway.  Assessment/Plan:   1.  Left hemispheric stroke, likely cardioembolic secondary to atrial fibrillation 2.  Hyperlipidemia 3.  Tobacco use disorder - currently not smoking.  1.  Secondary stroke prevention as per PCP: -  Agree with anticoagulation for secondary stroke prevention -  Continue statin therapy.  LDL goal less than 70 -  Glycemic control. Hgb A1c goal less than 7 -  Blood pressure control 2.  Check CTA of head and neck 3.  Encouraged continuing tobacco cessation 4.  Supposed to start PT/OT.  Agree with plan 5.  Follow up 4 to 6 months.    Thank you for allowing me to take part in the care of this patient.  Metta Clines, DO  CC: Vic Blackbird, MD

## 2020-09-11 NOTE — Telephone Encounter (Signed)
Orders for OT placed for RUE weakness from recent CVA

## 2020-09-12 ENCOUNTER — Encounter: Payer: Self-pay | Admitting: Neurology

## 2020-09-12 ENCOUNTER — Other Ambulatory Visit: Payer: Self-pay

## 2020-09-12 ENCOUNTER — Telehealth: Payer: Self-pay | Admitting: *Deleted

## 2020-09-12 ENCOUNTER — Other Ambulatory Visit: Payer: Self-pay | Admitting: *Deleted

## 2020-09-12 ENCOUNTER — Ambulatory Visit (INDEPENDENT_AMBULATORY_CARE_PROVIDER_SITE_OTHER): Payer: Medicare HMO | Admitting: Neurology

## 2020-09-12 VITALS — BP 166/82 | HR 117 | Ht 68.0 in | Wt 129.0 lb

## 2020-09-12 DIAGNOSIS — E785 Hyperlipidemia, unspecified: Secondary | ICD-10-CM

## 2020-09-12 DIAGNOSIS — I63412 Cerebral infarction due to embolism of left middle cerebral artery: Secondary | ICD-10-CM

## 2020-09-12 DIAGNOSIS — I4891 Unspecified atrial fibrillation: Secondary | ICD-10-CM

## 2020-09-12 MED ORDER — NITROGLYCERIN 0.4 MG SL SUBL: SUBLINGUAL_TABLET | SUBLINGUAL | 0 refills | Status: DC

## 2020-09-12 NOTE — Telephone Encounter (Signed)
Received call from patient and patient SO [name].   Reports that patient is having multiple incontinent stool episodes with no warning. Reports that neurology feels nerves may have been damaged from CVA and thus patient cannot feel when he needs to go.   Requested prescription for incontinence supplies (pull ups/ bed pads). Patient has Building services engineer. Advised to contact insurance to determine if they will cover supplies and if so, which company they prefer.   Inquired as to if there is medication that could help bowel control.   Please advise.

## 2020-09-12 NOTE — Patient Instructions (Addendum)
1.  Continue Eliquis 2.  Continue atorvastatin 40mg  daily 3.  Continue not smoking 4.  Will check CTA of head and neck. We have sent a referral to [facility] for your CTA and they will call you directly to schedule your appointment. They are located at [place]. If you need to contact them directly please call [phone].  5.  Follow up with Dr. Buelah Manis regarding high blood pressure 6.  Will need occupational therapy and physical therapy 7.  Follow up 4 to 6 months.

## 2020-09-13 ENCOUNTER — Telehealth: Payer: Self-pay

## 2020-09-13 NOTE — Telephone Encounter (Signed)
Okay to give Trelegy, whatever we have available is fine for now

## 2020-09-13 NOTE — Telephone Encounter (Signed)
There is no medication for the stool incontinence If he has multiple diarrheal stools then he can try immodium If he is constipated he may be leaking stool. So Try Miralax   He would have to get supplies himself he doesn't have [employer]  If needed schedule OV

## 2020-09-13 NOTE — Telephone Encounter (Signed)
Called pt to address concerns, left message for call bk

## 2020-09-13 NOTE — Telephone Encounter (Signed)
Patient requested samples of Breztri.   We do not have samples at this time, but we do have Trelegy 239mcg. Is this comparable?

## 2020-09-14 DIAGNOSIS — J449 Chronic obstructive pulmonary disease, unspecified: Secondary | ICD-10-CM | POA: Diagnosis not present

## 2020-09-15 DIAGNOSIS — J449 Chronic obstructive pulmonary disease, unspecified: Secondary | ICD-10-CM | POA: Diagnosis not present

## 2020-09-15 DIAGNOSIS — M6281 Muscle weakness (generalized): Secondary | ICD-10-CM | POA: Diagnosis not present

## 2020-09-16 NOTE — Telephone Encounter (Signed)
[name] made aware and will pick up samples.

## 2020-10-04 ENCOUNTER — Ambulatory Visit
Admission: RE | Admit: 2020-10-04 | Discharge: 2020-10-04 | Disposition: A | Discharge status: Home / Self Care | Payer: Medicare HMO | Source: Ambulatory Visit | Attending: Neurology | Admitting: Neurology

## 2020-10-04 DIAGNOSIS — I6782 Cerebral ischemia: Secondary | ICD-10-CM | POA: Diagnosis not present

## 2020-10-04 DIAGNOSIS — I6523 Occlusion and stenosis of bilateral carotid arteries: Secondary | ICD-10-CM | POA: Diagnosis not present

## 2020-10-04 DIAGNOSIS — I63412 Cerebral infarction due to embolism of left middle cerebral artery: Secondary | ICD-10-CM

## 2020-10-04 DIAGNOSIS — I672 Cerebral atherosclerosis: Secondary | ICD-10-CM | POA: Diagnosis not present

## 2020-10-04 DIAGNOSIS — I4891 Unspecified atrial fibrillation: Secondary | ICD-10-CM

## 2020-10-04 DIAGNOSIS — I639 Cerebral infarction, unspecified: Secondary | ICD-10-CM | POA: Diagnosis not present

## 2020-10-04 DIAGNOSIS — E785 Hyperlipidemia, unspecified: Secondary | ICD-10-CM

## 2020-10-04 MED ORDER — IOPAMIDOL (ISOVUE-370) INJECTION 76%
75.0000 mL | Freq: Once | INTRAVENOUS | Status: AC | PRN
  Administered 2020-10-04: 75 mL via INTRAVENOUS

## 2020-10-07 ENCOUNTER — Encounter: Payer: Self-pay | Admitting: Family Medicine

## 2020-10-07 ENCOUNTER — Other Ambulatory Visit: Payer: Self-pay

## 2020-10-07 ENCOUNTER — Ambulatory Visit (INDEPENDENT_AMBULATORY_CARE_PROVIDER_SITE_OTHER): Payer: Medicare HMO | Admitting: Family Medicine

## 2020-10-07 VITALS — BP 148/80 | HR 86 | Temp 98.1°F | Resp 18

## 2020-10-07 DIAGNOSIS — F419 Anxiety disorder, unspecified: Secondary | ICD-10-CM

## 2020-10-07 DIAGNOSIS — I25110 Atherosclerotic heart disease of native coronary artery with unstable angina pectoris: Secondary | ICD-10-CM | POA: Diagnosis not present

## 2020-10-07 DIAGNOSIS — I6930 Unspecified sequelae of cerebral infarction: Secondary | ICD-10-CM

## 2020-10-07 DIAGNOSIS — J449 Chronic obstructive pulmonary disease, unspecified: Secondary | ICD-10-CM | POA: Diagnosis not present

## 2020-10-07 DIAGNOSIS — E43 Unspecified severe protein-calorie malnutrition: Secondary | ICD-10-CM | POA: Diagnosis not present

## 2020-10-07 DIAGNOSIS — I4891 Unspecified atrial fibrillation: Secondary | ICD-10-CM

## 2020-10-07 MED ORDER — LORAZEPAM 0.5 MG PO TABS: 0.5000 mg | ORAL_TABLET | Freq: Two times a day (BID) | ORAL | 2 refills | Status: DC | PRN

## 2020-10-07 MED ORDER — BENZONATATE 100 MG PO CAPS: 100.0000 mg | ORAL_CAPSULE | Freq: Two times a day (BID) | ORAL | 0 refills | Status: DC | PRN

## 2020-10-07 NOTE — Assessment & Plan Note (Signed)
He has history of coronary artery disease.  Blood pressure mildly elevated today.  He did not check it at home.  He is on Lipitor.  He is also on Eliquis for history of stroke and transient atrial fibrillation.

## 2020-10-07 NOTE — Assessment & Plan Note (Signed)
Unable to weigh today

## 2020-10-07 NOTE — Assessment & Plan Note (Signed)
Neurology has evaluated pt Given info for neurorehab

## 2020-10-07 NOTE — Patient Instructions (Addendum)
Call Physical therapy  [phone] Look for ativan (lorazepam) 0.5mg   bottle   Dr. Lamonte Sakai- 03/03 @3 :15 PM Changed to Dr. Dennard Schaumann F/U 4 months - 30 minute slot

## 2020-10-07 NOTE — Progress Notes (Signed)
Subjective:    Patient ID: Zachary Chambers, male    DOB: 1950-10-01, 70 y.o.   MRN: [id number]  Patient presents for Follow-up and SOB  Pt here for follow-up chronic medical problems.  He is here today with his girlfriend again.  Since our last visit he has not had an appointment with the lung doctor.  He continues have difficulty with his breathing. Sits most of day and laying around Getting bathroom orkitchen gets SOB, using 2L all day Using symbicort and Breztri he also requires his albuterol every 4-6 hours.   He has a chronic cough and sometimes gets a tickle in his throat.  Requesting something for cough that he can use as well.  History of stroke he did see neurology.  He had a CTA done which did not show any blockages or abnormalities.  He was advised to continue with his current regimen.  Has not had any abnormal bruising with Eliquis.  Also recommended that he go ahead and start physical therapy and Occupational Therapy.  They have been contacted about this but the late the appointment.  HTN taking meds he has already scheduled with cardiology.-Blood pressure at home but it does increase when he gets into the office as it takes a lot for him to get settled  Meds reviewed  Review Of Systems:  GEN- denies fatigue, fever, weight loss,weakness, recent illness HEENT- denies eye drainage, change in vision, nasal discharge, CVS- denies chest pain, palpitations RESP- + SOB, +cough, wheeze ABD- denies N/V, change in stools, abd pain GU- denies dysuria, hematuria, dribbling, incontinence MSK- denies joint pain, muscle aches, injury Neuro- denies headache, dizziness, syncope, seizure activity       Objective:    BP (!) 148/80   Pulse 86   Temp 98.1 F (36.7 C) (Temporal)   Resp 18   SpO2 96% Comment: 2L/min via [place] GEN- NAD, alert and oriented x3, thin elderly male, weak appearing, sitting wheelchair  HEENT- PERRL, EOMI, non injected sclera, pink conjunctiva, MMM, oropharynx  clear Neck- Supple, no thryomegaly CVS- RRR, no murmur, distant sounds RESP-CTAB ABD-NABS,soft,NT,ND EXT- pedal  Edema bilat, above ankles  NEURO-CNII-XII in tact, decreased strength RUE compared to left, decreased grasp right hand,  Pulses- Radial 2+      Assessment & Plan:   Approx 30 minutes spent with pt,reviewing meds, scheduling appointments, reviewing last visit notes    Problem List Items Addressed This Visit      Unprioritized   Anxiety    He has some underlying anxiety and also some insomnia issues.  He does take the lorazepam advised he can take up to twice a day.  He gets worked up sometimes with his breathing and this can help calm him.      Relevant Medications   LORazepam (ATIVAN) 0.5 MG tablet   CAD (coronary artery disease)    He has history of coronary artery disease.  Blood pressure mildly elevated today.  He did not check it at home.  He is on Lipitor.  He is also on Eliquis for history of stroke and transient atrial fibrillation.      COPD (chronic obstructive pulmonary disease) ([place])    Please schedule appointment with his pulmonologist.  At this time he Breztri/Symbicort  needed albuterol and oxygen therapy Tessalon given for cough       Relevant Medications   benzonatate (TESSALON) 100 MG capsule   History of CVA with residual deficit    Neurology has evaluated pt Given  info for neurorehab      Protein-calorie malnutrition, severe    Unable to weigh today      Transient atrial fibrillation ([place]) - Primary      Note: This dictation was prepared with Dragon dictation along with smaller phrase technology. Any transcriptional errors that result from this process are unintentional.

## 2020-10-07 NOTE — Assessment & Plan Note (Signed)
Please schedule appointment with his pulmonologist.  At this time he Breztri/Symbicort  needed albuterol and oxygen therapy Tessalon given for cough

## 2020-10-07 NOTE — Assessment & Plan Note (Signed)
He has some underlying anxiety and also some insomnia issues.  He does take the lorazepam advised he can take up to twice a day.  He gets worked up sometimes with his breathing and this can help calm him.

## 2020-10-12 DIAGNOSIS — I4891 Unspecified atrial fibrillation: Secondary | ICD-10-CM | POA: Diagnosis not present

## 2020-10-12 DIAGNOSIS — U071 COVID-19: Secondary | ICD-10-CM | POA: Diagnosis not present

## 2020-10-12 DIAGNOSIS — R0600 Dyspnea, unspecified: Secondary | ICD-10-CM | POA: Diagnosis not present

## 2020-10-12 DIAGNOSIS — J449 Chronic obstructive pulmonary disease, unspecified: Secondary | ICD-10-CM | POA: Diagnosis not present

## 2020-10-12 DIAGNOSIS — J9621 Acute and chronic respiratory failure with hypoxia: Secondary | ICD-10-CM | POA: Diagnosis not present

## 2020-10-12 DIAGNOSIS — J1282 Pneumonia due to coronavirus disease 2019: Secondary | ICD-10-CM | POA: Diagnosis not present

## 2020-10-14 ENCOUNTER — Other Ambulatory Visit: Payer: Self-pay | Admitting: Family Medicine

## 2020-10-14 ENCOUNTER — Other Ambulatory Visit: Payer: Self-pay | Admitting: *Deleted

## 2020-10-14 DIAGNOSIS — M6281 Muscle weakness (generalized): Secondary | ICD-10-CM | POA: Diagnosis not present

## 2020-10-14 DIAGNOSIS — J449 Chronic obstructive pulmonary disease, unspecified: Secondary | ICD-10-CM | POA: Diagnosis not present

## 2020-10-14 MED ORDER — OMEPRAZOLE 40 MG PO CPDR: 40.0000 mg | DELAYED_RELEASE_CAPSULE | Freq: Every day | ORAL | 3 refills | Status: DC

## 2020-10-14 MED ORDER — ATORVASTATIN CALCIUM 40 MG PO TABS: 40.0000 mg | ORAL_TABLET | Freq: Every day | ORAL | 3 refills | Status: DC

## 2020-10-14 MED ORDER — LORAZEPAM 0.5 MG PO TABS: 0.5000 mg | ORAL_TABLET | Freq: Two times a day (BID) | ORAL | 0 refills | Status: DC | PRN

## 2020-10-14 NOTE — Telephone Encounter (Signed)
Received fax requesting refill on Ativan to mail order.   Ok to refill??  Last office visit/ refill 10/07/2020, #2 refills to retail pharmacy.  Of note, if approved, please review quantity.

## 2020-10-17 ENCOUNTER — Ambulatory Visit: Payer: Medicare HMO | Admitting: Emergency Medicine

## 2020-10-17 ENCOUNTER — Other Ambulatory Visit: Payer: Self-pay

## 2020-10-17 ENCOUNTER — Telehealth: Payer: Self-pay | Admitting: Emergency Medicine

## 2020-10-17 DIAGNOSIS — J449 Chronic obstructive pulmonary disease, unspecified: Secondary | ICD-10-CM

## 2020-10-17 NOTE — Telephone Encounter (Signed)
Called and spoke with pt's wife Zachary Chambers. Zachary Chambers states that pt was supposed to have had an appt today 3/3 with [facility] but they were not able to come to that appt due to pt having an issue with O2 tank leaking.  Pt was in hospital 11/18-11/24 due to having covid pneumonia and when pt was discharged from the hospital, pt was discharged on 2L O2.  Zachary Chambers states that pt is wanting to try to get a POC as the tanks are too heavy for them to lug around when they go out and I stated to Zachary Chambers what we needed to do is reschedule pt's appt and that we would need to do a qualifying walk using the POC due to it being pulsed flow vs the tanks which is continuous flow.  [name] verbalized understanding and had me explain all this to pt as well. appt has been scheduled for pt to see [name] Monday, 3/7. Nothing further needed.

## 2020-10-21 ENCOUNTER — Ambulatory Visit (INDEPENDENT_AMBULATORY_CARE_PROVIDER_SITE_OTHER): Payer: Medicare HMO | Admitting: Acute Care

## 2020-10-21 ENCOUNTER — Encounter: Payer: Self-pay | Admitting: Acute Care

## 2020-10-21 ENCOUNTER — Other Ambulatory Visit: Payer: Self-pay

## 2020-10-21 VITALS — HR 98

## 2020-10-21 DIAGNOSIS — Z681 Body mass index (BMI) 19 or less, adult: Secondary | ICD-10-CM

## 2020-10-21 DIAGNOSIS — J449 Chronic obstructive pulmonary disease, unspecified: Secondary | ICD-10-CM

## 2020-10-21 DIAGNOSIS — U071 COVID-19: Secondary | ICD-10-CM

## 2020-10-21 DIAGNOSIS — J1282 Pneumonia due to coronavirus disease 2019: Secondary | ICD-10-CM | POA: Diagnosis not present

## 2020-10-21 MED ORDER — BREZTRI AEROSPHERE 160-9-4.8 MCG/ACT IN AERO: 2.0000 | INHALATION_SPRAY | Freq: Two times a day (BID) | RESPIRATORY_TRACT | 0 refills | Status: DC

## 2020-10-21 NOTE — Patient Instructions (Addendum)
It is good to see you today. You do not qualify for a POC small tank, as you need > 5 L oxygen to maintain your oxygen saturations at > 88%. Please continue using your Breztri twice daily. Continue using your rescue inhaler as needed for breakthrough shortness of breath. Wear oxygen at 2.5 L with rest, and increase as needed with activity. Saturation goal is 88-92%  We will order PFT's. Follow up with Dr. Lamonte Sakai ( Pt. Requesting to see Dr. Lamonte Sakai next) after PFT's Note your daily symptoms > remember "red flags" for COPD:  Increase in cough, increase in sputum production, increase in shortness of breath or activity intolerance. If you notice these symptoms, please call to be seen.   Please contact office for sooner follow up if symptoms do not improve or worsen or seek emergency care  Work on increasing activity. Consider nutritional supplements for weight gain and protein.Marland Kitchen  Please contact office for sooner follow up if symptoms do not improve or worsen or seek emergency care  Follow up in 1 month after PFT's with Dr. Lamonte Sakai

## 2020-10-21 NOTE — Progress Notes (Signed)
History of Present Illness Zachary Chambers is a 70 y.o. male former smoker ( Quit 06/2020) admitted top the  hospital 11/18-11/24 2/2  covid pneumonia.  Pt was discharged from the hospital on 2L O2.He is followed by Dr. Lamonte Sakai for COPD.   10/21/2020  Pt. Presents for POC qualifying walk. He did not qualify based on his walk today. His oxygen saturations on the POV were 83% on 5 L. I have explained that he needs continuous flow oxygen, and that the pulsed oxygen available through the POC is inadequate for his oxygen needs.. He  understands.  He will need to continue his  Breztri and rescue inhalers as he has been doing.Oxygen saturation goals are > 88%. Patient presents today in a wheelchair, and per his wife he is minimally active.  Gets out of bed/chair to go to the bathroom with a kitchen.  Patient is compliant with his oxygen and his respiratory and rescue inhalers. Patient has not had pulmonary function tests since 2014.  PFTs were scheduled in 2021, but never completed. Patient's wife is asking about the degree of patient's COPD. I explained that the PFTs done in 2014 confirmed severe COPD at that time. F/F ratio was 45% with a DLCO of 47%. Wife would like PFTs repeated, I am unsure if patient has the respiratory capacity to complete them. She states patient has had some weight gain, but is very thin. She is working hard to improve his diet and calorie intake. Test Results: PFT's 2014     CBC Latest Ref Rng & Units 08/05/2020 07/05/2020 07/04/2020  WBC 3.8 - 10.8 Thousand/uL 6.1 5.2 5.5  Hemoglobin 13.2 - 17.1 g/dL 12.2(L) 14.5 15.4  Hematocrit 38.5 - 50.0 % 36.7(L) 43.4 44.2  Platelets 140 - 400 Thousand/uL 372 237 226    BMP Latest Ref Rng & Units 08/05/2020 07/09/2020 07/08/2020  Glucose 65 - 99 mg/dL 84 131(H) 102(H)  BUN 7 - 25 mg/dL 7 20 17   Creatinine 0.70 - 1.18 mg/dL 0.61(L) 0.53(L) 0.57(L)  BUN/Creat Ratio 6 - 22 (calc) 11 - -  Sodium 135 - 146 mmol/L 141 132(L) 131(L)   Potassium 3.5 - 5.3 mmol/L 3.6 3.9 3.5  Chloride 98 - 110 mmol/L 99 93(L) 95(L)  CO2 20 - 32 mmol/L 31 29 27   Calcium 8.6 - 10.3 mg/dL 8.7 8.5(L) 8.2(L)    BNP No results found for: BNP  ProBNP No results found for: PROBNP  PFT    Component Value Date/Time   FEV1PRE 1.39 07/18/2013 1112   FEV1POST 1.42 07/18/2013 1112   FVCPOST 3.05 07/18/2013 1112   TLC 5.13 07/18/2013 1112   DLCOUNC 14.22 07/18/2013 1112   PREFEV1FVCRT 46 07/18/2013 1112   PSTFEV1FVCRT 47 07/18/2013 1112    CT ANGIO HEAD W OR WO CONTRAST  Result Date: 10/04/2020 CLINICAL DATA:  Right hand numbness. TIA suspected. Left frontoparietal vertex infarction seen 1 month ago. EXAM: CT ANGIOGRAPHY HEAD TECHNIQUE: Multidetector CT imaging of the head was performed using the standard protocol during bolus administration of intravenous contrast. Multiplanar CT image reconstructions and MIPs were obtained to evaluate the vascular anatomy. CONTRAST:  64mL ISOVUE-370 IOPAMIDOL (ISOVUE-370) INJECTION 76% COMPARISON:  MRI 08/29/2020 FINDINGS: CT HEAD Brain: Generalized atrophy. Chronic small-vessel ischemic changes of the cerebral hemispheric white matter. 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 No sign of acute infarction, mass lesion, hemorrhage, hydrocephalus or extra-axial collection. Vascular: There is atherosclerotic calcification of the major vessels at the  base of the brain. 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 CTA HEAD Anterior circulation: Both internal carotid arteries are patent through the skull base and siphon regions. There is ordinary siphon atherosclerotic calcification but no stenosis greater than 30%. The anterior and middle cerebral vessels are patent without proximal stenosis, aneurysm or vascular malformation. Focal calcification in the right proximal M1 region but without stenosis. No visible large or medium vessel occlusion.  Posterior circulation: Both vertebral arteries are patent to the basilar. No basilar stenosis. Posterior circulation branch vessels are patent without significant proximal stenosis or visible branch vessel occlusion. Venous sinuses: Patent and normal. Anatomic variants: None significant. IMPRESSION: 1. No acute finding by CT. Atrophy and chronic small-vessel ischemic changes. 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 2. No intracranial large or medium vessel occlusion or correctable proximal stenosis. Electronically Signed   By: Nelson Chimes M.D.   On: 10/04/2020 20:21     Past medical hx Past Medical History:  Diagnosis Date  . COPD (chronic obstructive pulmonary disease) (HCC)    not on home O2  . DJD (degenerative joint disease)    diffusely  . GERD (gastroesophageal reflux disease)   . Hyperlipidemia   . MI (myocardial infarction) ([place]) 2006   small MI and stents x 3  Dr. Doreatha Lew  . PUD (peptic ulcer disease)    with bleeding  . Rotator cuff disorder    has been evaluated by Dr Clifton James and [place] Use  . Smoking status: Former Smoker    Packs/day: 1.00    Years: 40.00    Pack years: 40.00    Types: Cigarettes  . Smokeless tobacco: Never Used  . Tobacco comment: declines patch  Vaping Use  . Vaping Use: Never used  Substance Use Topics  . Alcohol use: Yes    Alcohol/week: 30.0 standard drinks    Types: 30 Cans of beer per week    Comment: h/o heavy use, now 4 beers per day  . Drug use: Not Currently    Tobacco Cessation: Pt. Quit smoking 5 months ago, with a 40 pack year smoking history Past surgical hx, Family hx, Social hx all reviewed.  Current Outpatient Medications on File Prior to Visit  Medication Sig  . albuterol (PROVENTIL) (2.5 MG/3ML) 0.083% nebulizer solution Take 3 mLs (2.5 mg total) by nebulization every 4 (four) hours as needed for wheezing or shortness of breath.  Marland Kitchen albuterol (VENTOLIN HFA)  108 (90 Base) MCG/ACT inhaler INHALE 2 PUFFS INTO THE LUNGS EVERY 4 HOURS AS NEEDED FOR WHEEZING AND SHORTNESS OF BREATH (Patient taking differently: Inhale 2 puffs into the lungs every 4 (four) hours as needed for wheezing or shortness of breath.)  . apixaban (ELIQUIS) 2.5 MG TABS tablet Take 1 tablet (2.5 mg total) by mouth 2 (two) times daily.  Marland Kitchen aspirin 81 MG tablet Take 81 mg by mouth at bedtime.  Marland Kitchen atorvastatin (LIPITOR) 40 MG tablet Take 1 tablet (40 mg total) by mouth at bedtime.  . benzonatate (TESSALON) 100 MG capsule Take 1 capsule (100 mg total) by mouth 2 (two) times daily as needed for cough.  . Budeson-Glycopyrrol-Formoterol (BREZTRI AEROSPHERE) 160-9-4.8 MCG/ACT AERO Inhale 2 puffs into the lungs 2 (two) times daily.  . budesonide-formoterol (SYMBICORT) 160-4.5 MCG/ACT inhaler Inhale 2 puffs into the lungs 2 (two) times daily.  . celecoxib (CELEBREX) 100 MG capsule Take 100 mg by mouth in the  morning and at bedtime.  . fluticasone (FLONASE) 50 MCG/ACT nasal spray USE 2 SPRAYS in EACH NOSTRIL EVERY DAY (Patient taking differently: Place 2 sprays into both nostrils daily.)  . folic acid (FOLVITE) 1 MG tablet TAKE 1 TABLET BY MOUTH EVERY DAY (Patient taking differently: Take 1 mg by mouth daily.)  . Glucosamine HCl (GLUCOSAMINE PO) Take 1,500 mg by mouth daily.  . hydrOXYzine (ATARAX/VISTARIL) 10 MG tablet TAKE 1 TABLET BY MOUTH 3 TIMES DAILY AS NEEDED (Patient taking differently: Take 10 mg by mouth as needed for anxiety.)  . LORazepam (ATIVAN) 0.5 MG tablet Take 1 tablet (0.5 mg total) by mouth 2 (two) times daily as needed for anxiety.  . Multiple Vitamin (MULTIVITAMIN WITH MINERALS) TABS tablet Take 1 tablet by mouth daily.  . nitroGLYCERIN (NITROSTAT) 0.4 MG SL tablet dissolve 1 TABLET UNDER THE TONGUE every 5 minutes AS NEEDED CHEST PAIN  . omeprazole (PRILOSEC) 40 MG capsule Take 1 capsule (40 mg total) by mouth daily.  Marland Kitchen tiZANidine (ZANAFLEX) 4 MG tablet TAKE 1 TABLET BY MOUTH  EVERY EIGHT HOURS AS NEEDED FOR MUSCLE SPASMS (Patient taking differently: Take 4 mg by mouth every 8 (eight) hours as needed for muscle spasms.)  . traMADol (ULTRAM) 50 MG tablet TAKE 1 TABLET BY MOUTH EVERY 6 HOURS AS NEEDED FOR moderate PAIN (Patient taking differently: Take 50 mg by mouth every 6 (six) hours as needed for moderate pain.)  . zinc gluconate 50 MG tablet Take 50 mg by mouth daily.   No current facility-administered medications on file prior to visit.     No Known Allergies  Review Of Systems:  Constitutional:   No  weight loss, night sweats,  Fevers, chills, fatigue, or  lassitude.  HEENT:   No headaches,  Difficulty swallowing,  Tooth/dental problems, or  Sore throat,                No sneezing, itching, ear ache, nasal congestion, post nasal drip,   CV:  No chest pain,  Orthopnea, PND, swelling in lower extremities, anasarca, dizziness, palpitations, syncope.   GI  No heartburn, indigestion, abdominal pain, nausea, vomiting, diarrhea, change in bowel habits, loss of appetite, bloody stools.   Resp: + shortness of breath with exertion or at rest.  + excess mucus, + productive cough,  No non-productive cough,  No coughing up of blood.  No change in color of mucus.  Occasional  wheezing.  No chest wall deformity  Skin: no rash or lesions.  GU: no dysuria, change in color of urine, no urgency or frequency.  No flank pain, no hematuria   MS:  No joint pain or swelling.  No decreased range of motion.  No back pain.  Psych:  No change in mood or affect. No depression or anxiety.  No memory loss.   Vital Signs Pulse 98   SpO2 (!) 83%    Physical Exam:  General- No distress,  A&Ox3, pleasant , thin and frail, in a wheelchair ENT: No sinus tenderness, TM clear, pale nasal mucosa, no oral exudate,no post nasal drip, no LAN Cardiac: S1, S2, regular rate and rhythm, no murmur Chest: No wheeze/ rales/ dullness; no accessory muscle use, no nasal flaring, no sternal  retractions, diminished per bases. Abd.: Soft Non-tender, ND, BS +, thin and frail Ext: No clubbing cyanosis, edema Neuro:  Deconditioned , frail, in wheelchair Skin: No rashes, warm and dry Psych: normal mood and behavior   Assessment/Plan  COPD with chronic hypoxic respiratory failure  Does not maintain sats of > 88% on POC with pulsed flow on 5 L Plan You do not qualify for a POC small tank, as you need > 5 L oxygen to maintain your oxygen saturations at > 88%. Please continue using your Breztri twice daily. Continue using your rescue inhaler as needed for breakthrough shortness of breath. Wear oxygen at 2.5 L with rest, and increase as needed with activity. Saturation goal is 88-92%  We will order PFT's. Follow up with Dr. Lamonte Sakai ( Pt. Requesting to see Dr. Lamonte Sakai next) after PFT's Note your daily symptoms > remember "red flags" for COPD:  Increase in cough, increase in sputum production, increase in shortness of breath or activity intolerance. If you notice these symptoms, please call to be seen.   Please contact office for sooner follow up if symptoms do not improve or worsen or seek emergency care  Work on increasing activity. Consider nutritional supplements for weight gain and protein.Marland Kitchen  Please contact office for sooner follow up if symptoms do not improve or worsen or seek emergency care  Follow up in 1 month after PFT's with Dr. Lamonte Sakai  BMI of 19.6 Plan Continue to eat small frequent meals Consider nutritional supplements like boost   Magdalen Spatz, NP 10/21/2020  7:38 PM

## 2020-11-09 DIAGNOSIS — U071 COVID-19: Secondary | ICD-10-CM | POA: Diagnosis not present

## 2020-11-09 DIAGNOSIS — J1282 Pneumonia due to coronavirus disease 2019: Secondary | ICD-10-CM | POA: Diagnosis not present

## 2020-11-09 DIAGNOSIS — J9621 Acute and chronic respiratory failure with hypoxia: Secondary | ICD-10-CM | POA: Diagnosis not present

## 2020-11-09 DIAGNOSIS — R0600 Dyspnea, unspecified: Secondary | ICD-10-CM | POA: Diagnosis not present

## 2020-11-09 DIAGNOSIS — J449 Chronic obstructive pulmonary disease, unspecified: Secondary | ICD-10-CM | POA: Diagnosis not present

## 2020-11-09 DIAGNOSIS — I4891 Unspecified atrial fibrillation: Secondary | ICD-10-CM | POA: Diagnosis not present

## 2020-11-11 ENCOUNTER — Other Ambulatory Visit: Payer: Self-pay | Admitting: Family Medicine

## 2020-11-12 DIAGNOSIS — J449 Chronic obstructive pulmonary disease, unspecified: Secondary | ICD-10-CM | POA: Diagnosis not present

## 2020-11-13 DIAGNOSIS — J449 Chronic obstructive pulmonary disease, unspecified: Secondary | ICD-10-CM | POA: Diagnosis not present

## 2020-11-13 DIAGNOSIS — M6281 Muscle weakness (generalized): Secondary | ICD-10-CM | POA: Diagnosis not present

## 2020-11-15 ENCOUNTER — Other Ambulatory Visit: Payer: Self-pay | Admitting: Family Medicine

## 2020-11-21 ENCOUNTER — Telehealth: Payer: Self-pay | Admitting: Acute Care

## 2020-11-21 NOTE — Progress Notes (Deleted)
CARDIOLOGY CONSULT NOTE       Patient ID: Zachary Chambers MRN: [id number] DOB/AGE: Apr 15, 1951 70 y.o.   Primary Physician: [name], MD Primary Cardiologist: [name] F/U CAD  HPI:  70 y.o. I have note seen in a while. Last seen in consult by Dr [name] in hospital 03/06/20 History of CAD with PCI circumflex and then LAD last 2003 Has COPD still smoking Has vascular disease with diagnosis of AAA but diameter < 3.0 on duplex 2018 Presented with SSCP He r/o with no acute ECG changes Myovue 03/08/20 no ischemia low risk study EF 63% d/c home   He follows with [facility] Quit smoking 06/2020 after COVID pneumonia d/c on 2L oxygen He is very inactive and in wheel chair mostly Severe COPD F/F ratio 45% DLCO 47% Needs up to 5L with activity   History of CVA 08/29/20 seen by neurology on low dose eliquis  Carotids plaque no stenosis 08/19/18  CTA head 10/04/20 no vascular stenosis old left frontoparietal infarct He does have a history of PAF   ***  ROS All other systems reviewed and negative except as noted above  Past Medical History:  Diagnosis Date  . COPD (chronic obstructive pulmonary disease) (HCC)    not on home O2  . DJD (degenerative joint disease)    diffusely  . GERD (gastroesophageal reflux disease)   . Hyperlipidemia   . MI (myocardial infarction) ([place]) 2006   small MI and stents x 3  Dr. Doreatha Lew  . PUD (peptic ulcer disease)    with bleeding  . Rotator cuff disorder    has been evaluated by Dr Clifton James and Koleen Distance    Family History  Problem Relation Age of Onset  . Ovarian cancer Mother   . Diabetes Maternal Grandfather   . COPD Father   . Diabetes Paternal Grandfather   . Colon cancer Paternal Grandfather   . Esophageal cancer Neg Hx   . Rectal cancer Neg Hx   . Stomach cancer Neg Hx     Social History   Socioeconomic History  . Marital status: Single    Spouse name: Not on file  . Number of children: Not on file  . Years of education: Not on  file  . Highest education level: Not on file  Occupational History  . Occupation: retired  Tobacco Use  . Smoking status: Former Smoker    Packs/day: 1.00    Years: 40.00    Pack years: 40.00    Types: Cigarettes  . Smokeless tobacco: Never Used  . Tobacco comment: declines patch  Vaping Use  . Vaping Use: Never used  Substance and Sexual Activity  . Alcohol use: Yes    Alcohol/week: 30.0 standard drinks    Types: 30 Cans of beer per week    Comment: h/o heavy use, now 4 beers per day  . Drug use: Not Currently  . Sexual activity: Yes  Other Topics Concern  . Not on file  Social History Narrative   Right Handed   Lives in one story home       Patient has not smoked for 3 months   Patient on 2L of Oxygen    Social Determinants of Health   Financial Resource Strain: Not on file  Food Insecurity: Not on file  Transportation Needs: Not on file  Physical Activity: Not on file  Stress: Not on file  Social Connections: Not on file  Intimate Partner Violence: Not on file  Past Surgical History:  Procedure Laterality Date  . COLONOSCOPY    . EYE SURGERY     right eye removed at age 55  . heart stent     x3- 2006      Current Outpatient Medications:  .  albuterol (PROVENTIL) (2.5 MG/3ML) 0.083% nebulizer solution, Take 3 mLs (2.5 mg total) by nebulization every 4 (four) hours as needed for wheezing or shortness of breath., Disp: 180 mL, Rfl: 4 .  albuterol (VENTOLIN HFA) 108 (90 Base) MCG/ACT inhaler, INHALE 2 PUFFS INTO THE LUNGS EVERY 4 HOURS AS NEEDED FOR WHEEZING AND SHORTNESS OF BREATH, Disp: 18 g, Rfl: 0 .  apixaban (ELIQUIS) 2.5 MG TABS tablet, Take 1 tablet (2.5 mg total) by mouth 2 (two) times daily., Disp: 60 tablet, Rfl: 2 .  aspirin 81 MG tablet, Take 81 mg by mouth at bedtime., Disp: , Rfl:  .  atorvastatin (LIPITOR) 40 MG tablet, Take 1 tablet (40 mg total) by mouth at bedtime., Disp: 90 tablet, Rfl: 3 .  benzonatate (TESSALON) 100 MG capsule, Take 1  capsule (100 mg total) by mouth 2 (two) times daily as needed for cough., Disp: 20 capsule, Rfl: 0 .  Budeson-Glycopyrrol-Formoterol (BREZTRI AEROSPHERE) 160-9-4.8 MCG/ACT AERO, Inhale 2 puffs into the lungs 2 (two) times daily., Disp: 10.7 g, Rfl: 6 .  Budeson-Glycopyrrol-Formoterol (BREZTRI AEROSPHERE) 160-9-4.8 MCG/ACT AERO, Inhale 2 puffs into the lungs in the morning and at bedtime., Disp: 4.8 g, Rfl: 0 .  budesonide-formoterol (SYMBICORT) 160-4.5 MCG/ACT inhaler, Inhale 2 puffs into the lungs 2 (two) times daily., Disp: 1 Inhaler, Rfl: 5 .  celecoxib (CELEBREX) 100 MG capsule, Take 100 mg by mouth in the morning and at bedtime., Disp: , Rfl:  .  fluticasone (FLONASE) 50 MCG/ACT nasal spray, USE 2 SPRAYS in EACH NOSTRIL EVERY DAY (Patient taking differently: Place 2 sprays into both nostrils daily.), Disp: 16 g, Rfl: 6 .  folic acid (FOLVITE) 1 MG tablet, TAKE 1 TABLET BY MOUTH EVERY DAY, Disp: 30 tablet, Rfl: 0 .  Glucosamine HCl (GLUCOSAMINE PO), Take 1,500 mg by mouth daily., Disp: , Rfl:  .  hydrOXYzine (ATARAX/VISTARIL) 10 MG tablet, TAKE 1 TABLET BY MOUTH 3 TIMES DAILY AS NEEDED (Patient taking differently: Take 10 mg by mouth as needed for anxiety.), Disp: 30 tablet, Rfl: 1 .  LORazepam (ATIVAN) 0.5 MG tablet, Take 1 tablet (0.5 mg total) by mouth 2 (two) times daily as needed for anxiety., Disp: 180 tablet, Rfl: 0 .  Multiple Vitamin (MULTIVITAMIN WITH MINERALS) TABS tablet, Take 1 tablet by mouth daily., Disp: 90 tablet, Rfl: 0 .  nitroGLYCERIN (NITROSTAT) 0.4 MG SL tablet, dissolve 1 TABLET UNDER THE TONGUE every 5 minutes AS NEEDED CHEST PAIN, Disp: 25 tablet, Rfl: 0 .  omeprazole (PRILOSEC) 40 MG capsule, Take 1 capsule (40 mg total) by mouth daily., Disp: 90 capsule, Rfl: 3 .  tiZANidine (ZANAFLEX) 4 MG tablet, TAKE 1 TABLET BY MOUTH EVERY EIGHT HOURS AS NEEDED FOR MUSCLE SPASMS (Patient taking differently: Take 4 mg by mouth every 8 (eight) hours as needed for muscle spasms.), Disp:  90 tablet, Rfl: 0 .  traMADol (ULTRAM) 50 MG tablet, TAKE 1 TABLET BY MOUTH EVERY 6 HOURS AS NEEDED FOR moderate PAIN (Patient taking differently: Take 50 mg by mouth every 6 (six) hours as needed for moderate pain.), Disp: 30 tablet, Rfl: 0 .  zinc gluconate 50 MG tablet, Take 50 mg by mouth daily., Disp: , Rfl:     Physical Exam: There  were no vitals taken for this visit.    Affect appropriate Chronically ill male  HEENT: normal Neck supple with no adenopathy JVP normal no bruits no thyromegaly Lungs  COPD exp wheezing  Heart:  S1/S2 no murmur, no rub, gallop or click PMI normal Abdomen: benighn, BS positve, no tenderness, no AAA no bruit.  No HSM or HJR Distal pulses intact with no bruits No edema Neuro non-focal Skin warm and dry No muscular weakness   Labs:   Lab Results  Component Value Date   WBC 6.1 08/05/2020   HGB 12.2 (L) 08/05/2020   HCT 36.7 (L) 08/05/2020   MCV 88.0 08/05/2020   PLT 372 08/05/2020   No results for input(s): NA, K, CL, CO2, BUN, CREATININE, CALCIUM, PROT, BILITOT, ALKPHOS, ALT, AST, GLUCOSE in the last 168 hours.  Invalid input(s): LABALBU No results found for: CKTOTAL, CKMB, CKMBINDEX, TROPONINI  Lab Results  Component Value Date   CHOL 118 03/08/2020   CHOL 137 11/06/2019   CHOL 137 02/01/2019   Lab Results  Component Value Date   HDL 49 03/08/2020   HDL 64 11/06/2019   HDL 68 02/01/2019   Lab Results  Component Value Date   LDLCALC 61 03/08/2020   LDLCALC 60 11/06/2019   LDLCALC 58 02/01/2019   Lab Results  Component Value Date   TRIG 46 07/04/2020   TRIG 41 03/08/2020   TRIG 51 11/06/2019   Lab Results  Component Value Date   CHOLHDL 2.4 03/08/2020   CHOLHDL 2.1 11/06/2019   CHOLHDL 2.0 02/01/2019   No results found for: LDLDIRECT    Radiology: No results found.  EKG: afib rapid rate 07/05/20    ASSESSMENT AND PLAN:   1. CAD:  Stable no angina distant PCI circumflex /LAD non ischemic myovue July 2021  continue medical Rx given co morbidities  2. COPD: chronic made worse by COVID pneumonia January f/u [facility] continue oxygen nebs Stopped smoking 06/2020   3. PAF:  Not well documented on eliquis   4. CVA: f/u neurology likely related above carotids CTA neck normal   5. HLD:  On statin labs with primary   Signed: [name] 11/21/2020, 5:35 PM

## 2020-11-21 NOTE — Telephone Encounter (Signed)
Called and spoke with pt and he stated that he did find a place that he could get the POC and they told him that these POC go up to 6 liters.  He stated that they told him that it would work for either pulsed or continuous.  He is waiting on them to send him the information on this POC.  He wanted to make SG aware of this.   He is to call back and let us know the name of the company as he said there is one local and one near [place].  FYI for SG.

## 2020-11-22 NOTE — Telephone Encounter (Signed)
As long as the device can go up to 6L continuous or pulsed. I would like to know the name of the device and the company. We have multiple patient's who could benefit from this.  Once he can provide more info we can went a prescription if needed.

## 2020-12-02 ENCOUNTER — Ambulatory Visit: Payer: Medicare HMO | Admitting: Cardiovascular Disease

## 2020-12-02 NOTE — Progress Notes (Signed)
CARDIOLOGY CONSULT NOTE      Virtual Visit via Video Note   This visit type was conducted due to national recommendations for restrictions regarding the COVID-19 Pandemic (e.g. social distancing) in an effort to limit this patient's exposure and mitigate transmission in our community.  Due to her co-morbid illnesses, this patient is at least at moderate risk for complications without adequate follow up.  This format is felt to be most appropriate for this patient at this time.  All issues noted in this document were discussed and addressed.  A limited physical exam was performed with this format.  Please refer to the patient's chart for her consent to telehealth for [facility].   Patient Location Home Physician Location Office  Primary Physician: [name], MD Primary Cardiologist: [name]  F/U CAD  HPI:  70 y.o. I have note seen in a while. Last seen in consult by Dr [name] in hospital 03/06/20 History of CAD with PCI circumflex and then LAD last 2003 Has COPD still smoking Has vascular disease with diagnosis of AAA but diameter < 3.0 on duplex 2018 Presented with SSCP He r/o with no acute ECG changes Myovue 03/08/20 no ischemia low risk study EF 63% d/c home   He follows with [facility] Quit smoking 06/2020 after COVID pneumonia d/c on 2L oxygen He is very inactive and in wheel chair mostly Severe COPD F/F ratio 45% DLCO 47% Needs up to 5L with activity   History of CVA 08/29/20 seen by neurology on low dose eliquis  Carotids plaque no stenosis 08/19/18  CTA head 10/04/20 no vascular stenosis old left frontoparietal infarct He does have a history of PAF    ROS All other systems reviewed and negative except as noted above  Past Medical History:  Diagnosis Date  . COPD (chronic obstructive pulmonary disease) (HCC)    not on home O2  . DJD (degenerative joint disease)    diffusely  . GERD (gastroesophageal reflux disease)   . Hyperlipidemia   . MI (myocardial  infarction) ([place]) 2006   small MI and stents x 3  Dr. Doreatha Lew  . PUD (peptic ulcer disease)    with bleeding  . Rotator cuff disorder    has been evaluated by Dr Clifton James and Koleen Distance    Family History  Problem Relation Age of Onset  . Ovarian cancer Mother   . Diabetes Maternal Grandfather   . COPD Father   . Diabetes Paternal Grandfather   . Colon cancer Paternal Grandfather   . Esophageal cancer Neg Hx   . Rectal cancer Neg Hx   . Stomach cancer Neg Hx     Social History   Socioeconomic History  . Marital status: Single    Spouse name: Not on file  . Number of children: Not on file  . Years of education: Not on file  . Highest education level: Not on file  Occupational History  . Occupation: retired  Tobacco Use  . Smoking status: Former Smoker    Packs/day: 1.00    Years: 40.00    Pack years: 40.00    Types: Cigarettes  . Smokeless tobacco: Never Used  . Tobacco comment: declines patch  Vaping Use  . Vaping Use: Never used  Substance and Sexual Activity  . Alcohol use: Yes    Alcohol/week: 30.0 standard drinks    Types: 30 Cans of beer per week    Comment: h/o heavy use, now 4 beers per day  . Drug use:  Not Currently  . Sexual activity: Yes  Other Topics Concern  . Not on file  Social History Narrative   Right Handed   Lives in one story home       Patient has not smoked for 3 months   Patient on 2L of Oxygen    Social Determinants of Health   Financial Resource Strain: Not on file  Food Insecurity: Not on file  Transportation Needs: Not on file  Physical Activity: Not on file  Stress: Not on file  Social Connections: Not on file  Intimate Partner Violence: Not on file    Past Surgical History:  Procedure Laterality Date  . COLONOSCOPY    . EYE SURGERY     right eye removed at age 49  . heart stent     x3- 2006      Current Outpatient Medications:  .  albuterol (PROVENTIL) (2.5 MG/3ML) 0.083% nebulizer solution, Take 3 mLs (2.5 mg total)  by nebulization every 4 (four) hours as needed for wheezing or shortness of breath., Disp: 180 mL, Rfl: 4 .  albuterol (VENTOLIN HFA) 108 (90 Base) MCG/ACT inhaler, INHALE 2 PUFFS INTO THE LUNGS EVERY 4 HOURS AS NEEDED FOR WHEEZING AND SHORTNESS OF BREATH, Disp: 18 g, Rfl: 0 .  apixaban (ELIQUIS) 2.5 MG TABS tablet, Take 1 tablet (2.5 mg total) by mouth 2 (two) times daily., Disp: 60 tablet, Rfl: 2 .  aspirin 81 MG tablet, Take 81 mg by mouth at bedtime., Disp: , Rfl:  .  atorvastatin (LIPITOR) 40 MG tablet, Take 1 tablet (40 mg total) by mouth at bedtime., Disp: 90 tablet, Rfl: 3 .  benzonatate (TESSALON) 100 MG capsule, Take 1 capsule (100 mg total) by mouth 2 (two) times daily as needed for cough., Disp: 20 capsule, Rfl: 0 .  Budeson-Glycopyrrol-Formoterol (BREZTRI AEROSPHERE) 160-9-4.8 MCG/ACT AERO, Inhale 2 puffs into the lungs 2 (two) times daily., Disp: 10.7 g, Rfl: 6 .  Budeson-Glycopyrrol-Formoterol (BREZTRI AEROSPHERE) 160-9-4.8 MCG/ACT AERO, Inhale 2 puffs into the lungs in the morning and at bedtime., Disp: 4.8 g, Rfl: 0 .  budesonide-formoterol (SYMBICORT) 160-4.5 MCG/ACT inhaler, Inhale 2 puffs into the lungs 2 (two) times daily., Disp: 1 Inhaler, Rfl: 5 .  celecoxib (CELEBREX) 100 MG capsule, Take 100 mg by mouth in the morning and at bedtime., Disp: , Rfl:  .  fluticasone (FLONASE) 50 MCG/ACT nasal spray, USE 2 SPRAYS in EACH NOSTRIL EVERY DAY, Disp: 16 g, Rfl: 6 .  folic acid (FOLVITE) 1 MG tablet, TAKE 1 TABLET BY MOUTH EVERY DAY, Disp: 30 tablet, Rfl: 0 .  Glucosamine HCl (GLUCOSAMINE PO), Take 1,500 mg by mouth daily., Disp: , Rfl:  .  hydrOXYzine (ATARAX/VISTARIL) 10 MG tablet, TAKE 1 TABLET BY MOUTH 3 TIMES DAILY AS NEEDED, Disp: 30 tablet, Rfl: 1 .  LORazepam (ATIVAN) 0.5 MG tablet, Take 1 tablet (0.5 mg total) by mouth 2 (two) times daily as needed for anxiety., Disp: 180 tablet, Rfl: 0 .  Multiple Vitamin (MULTIVITAMIN WITH MINERALS) TABS tablet, Take 1 tablet by mouth daily.,  Disp: 90 tablet, Rfl: 0 .  nitroGLYCERIN (NITROSTAT) 0.4 MG SL tablet, dissolve 1 TABLET UNDER THE TONGUE every 5 minutes AS NEEDED CHEST PAIN, Disp: 25 tablet, Rfl: 0 .  omeprazole (PRILOSEC) 40 MG capsule, Take 1 capsule (40 mg total) by mouth daily., Disp: 90 capsule, Rfl: 3 .  tiZANidine (ZANAFLEX) 4 MG tablet, TAKE 1 TABLET BY MOUTH EVERY EIGHT HOURS AS NEEDED FOR MUSCLE SPASMS, Disp: 90 tablet, Rfl:  0 .  traMADol (ULTRAM) 50 MG tablet, TAKE 1 TABLET BY MOUTH EVERY 6 HOURS AS NEEDED FOR moderate PAIN, Disp: 30 tablet, Rfl: 0 .  zinc gluconate 50 MG tablet, Take 50 mg by mouth daily., Disp: , Rfl:     Physical Exam: Height 5\' 8"  (1.727 m), weight 55.8 kg.    Chronically ill No distress No tachypnea No JVP elevation    Labs:   Lab Results  Component Value Date   WBC 6.1 08/05/2020   HGB 12.2 (L) 08/05/2020   HCT 36.7 (L) 08/05/2020   MCV 88.0 08/05/2020   PLT 372 08/05/2020   No results for input(s): NA, K, CL, CO2, BUN, CREATININE, CALCIUM, PROT, BILITOT, ALKPHOS, ALT, AST, GLUCOSE in the last 168 hours.  Invalid input(s): LABALBU No results found for: CKTOTAL, CKMB, CKMBINDEX, TROPONINI  Lab Results  Component Value Date   CHOL 118 03/08/2020   CHOL 137 11/06/2019   CHOL 137 02/01/2019   Lab Results  Component Value Date   HDL 49 03/08/2020   HDL 64 11/06/2019   HDL 68 02/01/2019   Lab Results  Component Value Date   LDLCALC 61 03/08/2020   LDLCALC 60 11/06/2019   LDLCALC 58 02/01/2019   Lab Results  Component Value Date   TRIG 46 07/04/2020   TRIG 41 03/08/2020   TRIG 51 11/06/2019   Lab Results  Component Value Date   CHOLHDL 2.4 03/08/2020   CHOLHDL 2.1 11/06/2019   CHOLHDL 2.0 02/01/2019   No results found for: LDLDIRECT    Radiology: No results found.  EKG: afib rapid rate 07/05/20    ASSESSMENT AND PLAN:   1. CAD:  Stable no angina distant PCI circumflex /LAD non ischemic myovue July 2021 continue medical Rx given co morbidities  2.  COPD: chronic made worse by COVID pneumonia January f/u [facility] continue oxygen nebs Stopped smoking 06/2020   3. PAF:  Not well documented on eliquis   4. CVA: f/u neurology likely related above carotids CTA neck normal   5. HLD:  On statin labs with primary   F/U in a year  Time spent reviewing chart myovue July 2021, notes from [facility] direct patient interview and composing note 20 minutes   Signed: [name] 12/06/2020, 9:01 AM

## 2020-12-06 ENCOUNTER — Other Ambulatory Visit: Payer: Self-pay

## 2020-12-06 ENCOUNTER — Encounter: Payer: Self-pay | Admitting: Cardiovascular Disease

## 2020-12-06 ENCOUNTER — Telehealth (INDEPENDENT_AMBULATORY_CARE_PROVIDER_SITE_OTHER): Payer: Medicare HMO | Admitting: Cardiovascular Disease

## 2020-12-06 VITALS — Ht 68.0 in | Wt 123.0 lb

## 2020-12-06 DIAGNOSIS — E782 Mixed hyperlipidemia: Secondary | ICD-10-CM

## 2020-12-06 DIAGNOSIS — I480 Paroxysmal atrial fibrillation: Secondary | ICD-10-CM | POA: Diagnosis not present

## 2020-12-06 DIAGNOSIS — R0600 Dyspnea, unspecified: Secondary | ICD-10-CM | POA: Diagnosis not present

## 2020-12-06 DIAGNOSIS — I2510 Atherosclerotic heart disease of native coronary artery without angina pectoris: Secondary | ICD-10-CM | POA: Diagnosis not present

## 2020-12-06 DIAGNOSIS — R0609 Other forms of dyspnea: Secondary | ICD-10-CM

## 2020-12-06 NOTE — Patient Instructions (Signed)
Medication Instructions:  Your physician recommends that you continue on your current medications as directed. Please refer to the Current Medication list given to you today.   *If you need a refill on your cardiac medications before your next appointment, please call your pharmacy*   Lab Work: None ordered   If you have labs (blood work) drawn today and your tests are completely normal, you will receive your results only by: Marland Kitchen MyChart Message (if you have MyChart) OR . A paper copy in the mail If you have any lab test that is abnormal or we need to change your treatment, we will call you to review the results.   Testing/Procedures: None ordered    Follow-Up: At [facility], you and your health needs are our priority.  As part of our continuing mission to provide you with exceptional heart care, we have created designated Provider Care Teams.  These Care Teams include your primary Cardiologist (physician) and Advanced Practice Providers (APPs -  Physician Assistants and Nurse Practitioners) who all work together to provide you with the care you need, when you need it.  We recommend signing up for the patient portal called "MyChart".  Sign up information is provided on this After Visit Summary.  MyChart is used to connect with patients for Virtual Visits (Telemedicine).  Patients are able to view lab/test results, encounter notes, upcoming appointments, etc.  Non-urgent messages can be sent to your provider as well.   To learn more about what you can do with MyChart, go to [email or web address].    Your next appointment:   6 month(s)  The format for your next appointment:   In Person  Provider:   You may see [name], MD or one of the following Advanced Practice Providers on your designated Care Team:    Kathyrn Drown, NP    Other Instructions None

## 2020-12-09 ENCOUNTER — Telehealth: Payer: Self-pay | Admitting: Emergency Medicine

## 2020-12-09 ENCOUNTER — Inpatient Hospital Stay (HOSPITAL_COMMUNITY)
Admission: EM | Admit: 2020-12-09 | Discharge: 2020-12-12 | DRG: 190 | Disposition: A | Discharge status: Home Health | Payer: Medicare HMO | Attending: Family Medicine | Admitting: Family Medicine

## 2020-12-09 ENCOUNTER — Emergency Department (HOSPITAL_COMMUNITY): Payer: Medicare HMO

## 2020-12-09 ENCOUNTER — Other Ambulatory Visit: Payer: Self-pay

## 2020-12-09 DIAGNOSIS — Z87891 Personal history of nicotine dependence: Secondary | ICD-10-CM

## 2020-12-09 DIAGNOSIS — I6930 Unspecified sequelae of cerebral infarction: Secondary | ICD-10-CM

## 2020-12-09 DIAGNOSIS — Z7951 Long term (current) use of inhaled steroids: Secondary | ICD-10-CM | POA: Diagnosis not present

## 2020-12-09 DIAGNOSIS — I214 Non-ST elevation (NSTEMI) myocardial infarction: Secondary | ICD-10-CM

## 2020-12-09 DIAGNOSIS — J9621 Acute and chronic respiratory failure with hypoxia: Secondary | ICD-10-CM | POA: Diagnosis present

## 2020-12-09 DIAGNOSIS — K219 Gastro-esophageal reflux disease without esophagitis: Secondary | ICD-10-CM | POA: Diagnosis present

## 2020-12-09 DIAGNOSIS — F419 Anxiety disorder, unspecified: Secondary | ICD-10-CM | POA: Diagnosis present

## 2020-12-09 DIAGNOSIS — I2510 Atherosclerotic heart disease of native coronary artery without angina pectoris: Secondary | ICD-10-CM | POA: Diagnosis present

## 2020-12-09 DIAGNOSIS — Z9981 Dependence on supplemental oxygen: Secondary | ICD-10-CM | POA: Diagnosis not present

## 2020-12-09 DIAGNOSIS — Z955 Presence of coronary angioplasty implant and graft: Secondary | ICD-10-CM | POA: Diagnosis not present

## 2020-12-09 DIAGNOSIS — I25110 Atherosclerotic heart disease of native coronary artery with unstable angina pectoris: Secondary | ICD-10-CM | POA: Diagnosis not present

## 2020-12-09 DIAGNOSIS — Z20822 Contact with and (suspected) exposure to covid-19: Secondary | ICD-10-CM | POA: Diagnosis present

## 2020-12-09 DIAGNOSIS — Z825 Family history of asthma and other chronic lower respiratory diseases: Secondary | ICD-10-CM | POA: Diagnosis not present

## 2020-12-09 DIAGNOSIS — I69351 Hemiplegia and hemiparesis following cerebral infarction affecting right dominant side: Secondary | ICD-10-CM | POA: Diagnosis not present

## 2020-12-09 DIAGNOSIS — Z8616 Personal history of COVID-19: Secondary | ICD-10-CM

## 2020-12-09 DIAGNOSIS — R002 Palpitations: Secondary | ICD-10-CM | POA: Diagnosis not present

## 2020-12-09 DIAGNOSIS — J441 Chronic obstructive pulmonary disease with (acute) exacerbation: Principal | ICD-10-CM | POA: Diagnosis present

## 2020-12-09 DIAGNOSIS — J9811 Atelectasis: Secondary | ICD-10-CM | POA: Diagnosis not present

## 2020-12-09 DIAGNOSIS — U071 COVID-19: Secondary | ICD-10-CM | POA: Diagnosis not present

## 2020-12-09 DIAGNOSIS — R0602 Shortness of breath: Secondary | ICD-10-CM | POA: Diagnosis not present

## 2020-12-09 DIAGNOSIS — Z789 Other specified health status: Secondary | ICD-10-CM | POA: Diagnosis not present

## 2020-12-09 DIAGNOSIS — E785 Hyperlipidemia, unspecified: Secondary | ICD-10-CM | POA: Diagnosis present

## 2020-12-09 DIAGNOSIS — J1282 Pneumonia due to coronavirus disease 2019: Secondary | ICD-10-CM | POA: Diagnosis not present

## 2020-12-09 DIAGNOSIS — M159 Polyosteoarthritis, unspecified: Secondary | ICD-10-CM | POA: Diagnosis present

## 2020-12-09 DIAGNOSIS — Z9001 Acquired absence of eye: Secondary | ICD-10-CM | POA: Diagnosis not present

## 2020-12-09 DIAGNOSIS — J449 Chronic obstructive pulmonary disease, unspecified: Secondary | ICD-10-CM | POA: Diagnosis not present

## 2020-12-09 DIAGNOSIS — I252 Old myocardial infarction: Secondary | ICD-10-CM | POA: Diagnosis not present

## 2020-12-09 DIAGNOSIS — Z8711 Personal history of peptic ulcer disease: Secondary | ICD-10-CM | POA: Diagnosis not present

## 2020-12-09 DIAGNOSIS — Z7901 Long term (current) use of anticoagulants: Secondary | ICD-10-CM

## 2020-12-09 DIAGNOSIS — I248 Other forms of acute ischemic heart disease: Secondary | ICD-10-CM | POA: Diagnosis not present

## 2020-12-09 DIAGNOSIS — I10 Essential (primary) hypertension: Secondary | ICD-10-CM | POA: Diagnosis present

## 2020-12-09 DIAGNOSIS — Z79899 Other long term (current) drug therapy: Secondary | ICD-10-CM | POA: Diagnosis not present

## 2020-12-09 DIAGNOSIS — R7989 Other specified abnormal findings of blood chemistry: Secondary | ICD-10-CM | POA: Diagnosis not present

## 2020-12-09 DIAGNOSIS — F1010 Alcohol abuse, uncomplicated: Secondary | ICD-10-CM | POA: Diagnosis present

## 2020-12-09 DIAGNOSIS — R0902 Hypoxemia: Secondary | ICD-10-CM | POA: Diagnosis present

## 2020-12-09 DIAGNOSIS — R0609 Other forms of dyspnea: Secondary | ICD-10-CM | POA: Diagnosis not present

## 2020-12-09 DIAGNOSIS — Z7982 Long term (current) use of aspirin: Secondary | ICD-10-CM | POA: Diagnosis not present

## 2020-12-09 DIAGNOSIS — I313 Pericardial effusion (noninflammatory): Secondary | ICD-10-CM | POA: Diagnosis not present

## 2020-12-09 DIAGNOSIS — I4891 Unspecified atrial fibrillation: Secondary | ICD-10-CM | POA: Diagnosis present

## 2020-12-09 DIAGNOSIS — F1090 Alcohol use, unspecified, uncomplicated: Secondary | ICD-10-CM | POA: Diagnosis present

## 2020-12-09 DIAGNOSIS — I480 Paroxysmal atrial fibrillation: Secondary | ICD-10-CM | POA: Diagnosis present

## 2020-12-09 DIAGNOSIS — R0600 Dyspnea, unspecified: Secondary | ICD-10-CM | POA: Diagnosis not present

## 2020-12-09 LAB — CBC WITH DIFFERENTIAL/PLATELET
Abs Immature Granulocytes: 0.25 10*3/uL — ABNORMAL HIGH (ref 0.00–0.07)
Basophils Absolute: 0.1 10*3/uL (ref 0.0–0.1)
Basophils Relative: 0 %
Eosinophils Absolute: 0.1 10*3/uL (ref 0.0–0.5)
Eosinophils Relative: 0 %
HCT: 52.3 % — ABNORMAL HIGH (ref 39.0–52.0)
Hemoglobin: 16.4 g/dL (ref 13.0–17.0)
Immature Granulocytes: 1 %
Lymphocytes Relative: 3 %
Lymphs Abs: 0.5 10*3/uL — ABNORMAL LOW (ref 0.7–4.0)
MCH: 26.9 pg (ref 26.0–34.0)
MCHC: 31.4 g/dL (ref 30.0–36.0)
MCV: 85.9 fL (ref 80.0–100.0)
Monocytes Absolute: 1.6 10*3/uL — ABNORMAL HIGH (ref 0.1–1.0)
Monocytes Relative: 9 %
Neutro Abs: 15.9 10*3/uL — ABNORMAL HIGH (ref 1.7–7.7)
Neutrophils Relative %: 87 %
Platelets: 325 10*3/uL (ref 150–400)
RBC: 6.09 MIL/uL — ABNORMAL HIGH (ref 4.22–5.81)
RDW: 14.1 % (ref 11.5–15.5)
WBC: 18.3 10*3/uL — ABNORMAL HIGH (ref 4.0–10.5)
nRBC: 0 % (ref 0.0–0.2)

## 2020-12-09 LAB — I-STAT ARTERIAL BLOOD GAS, ED
Acid-Base Excess: 4 mmol/L — ABNORMAL HIGH (ref 0.0–2.0)
Bicarbonate: 29.9 mmol/L — ABNORMAL HIGH (ref 20.0–28.0)
Calcium, Ion: 1.26 mmol/L (ref 1.15–1.40)
HCT: 49 % (ref 39.0–52.0)
Hemoglobin: 16.7 g/dL (ref 13.0–17.0)
O2 Saturation: 97 %
Patient temperature: 98.3
Potassium: 3.8 mmol/L (ref 3.5–5.1)
Sodium: 136 mmol/L (ref 135–145)
TCO2: 31 mmol/L (ref 22–32)
pCO2 arterial: 48.5 mmHg — ABNORMAL HIGH (ref 32.0–48.0)
pH, Arterial: 7.398 (ref 7.350–7.450)
pO2, Arterial: 88 mmHg (ref 83.0–108.0)

## 2020-12-09 LAB — BASIC METABOLIC PANEL
Anion gap: 14 (ref 5–15)
BUN: 16 mg/dL (ref 8–23)
CO2: 25 mmol/L (ref 22–32)
Calcium: 9.5 mg/dL (ref 8.9–10.3)
Chloride: 95 mmol/L — ABNORMAL LOW (ref 98–111)
Creatinine, Ser: 1.02 mg/dL (ref 0.61–1.24)
GFR, Estimated: >60 mL/min (ref >60)
Glucose, Bld: 112 mg/dL — ABNORMAL HIGH (ref 70–99)
Potassium: 3.9 mmol/L (ref 3.5–5.1)
Sodium: 134 mmol/L — ABNORMAL LOW (ref 135–145)

## 2020-12-09 LAB — TROPONIN I (HIGH SENSITIVITY)
Troponin I (High Sensitivity): 333 ng/L (ref <18)
Troponin I (High Sensitivity): 272 ng/L (ref <18)

## 2020-12-09 LAB — D-DIMER, QUANTITATIVE: D-Dimer, Quant: 0.93 ug/mL-FEU — ABNORMAL HIGH (ref 0.00–0.50)

## 2020-12-09 LAB — RESP PANEL BY RT-PCR (FLU A&B, COVID) ARPGX2
Influenza A by PCR: NEGATIVE
Influenza B by PCR: NEGATIVE
SARS Coronavirus 2 by RT PCR: NEGATIVE

## 2020-12-09 MED ORDER — AZITHROMYCIN 250 MG PO TABS
250.0000 mg | ORAL_TABLET | Freq: Every day | ORAL | Status: DC
  Administered 2020-12-10 – 2020-12-12 (×3): 250 mg via ORAL
  Filled 2020-12-09 (×3): qty 1

## 2020-12-09 MED ORDER — FLUTICASONE FUROATE-VILANTEROL 200-25 MCG/INH IN AEPB: 1.0000 | INHALATION_SPRAY | Freq: Every day | RESPIRATORY_TRACT | Status: DC

## 2020-12-09 MED ORDER — ATORVASTATIN CALCIUM 40 MG PO TABS
40.0000 mg | ORAL_TABLET | Freq: Every day | ORAL | Status: DC
  Administered 2020-12-10 – 2020-12-11 (×3): 40 mg via ORAL
  Filled 2020-12-09 (×2): qty 1
  Filled 2020-12-09: qty 4

## 2020-12-09 MED ORDER — DEXAMETHASONE SODIUM PHOSPHATE 10 MG/ML IJ SOLN
10.0000 mg | Freq: Once | INTRAMUSCULAR | Status: AC
  Administered 2020-12-09: 10 mg via INTRAVENOUS
  Filled 2020-12-09: qty 1

## 2020-12-09 MED ORDER — NITROGLYCERIN 0.4 MG SL SUBL: 0.4000 mg | SUBLINGUAL_TABLET | SUBLINGUAL | Status: DC | PRN

## 2020-12-09 MED ORDER — POLYETHYLENE GLYCOL 3350 17 G PO PACK: 17.0000 g | PACK | Freq: Every day | ORAL | Status: DC | PRN

## 2020-12-09 MED ORDER — ALBUTEROL SULFATE (2.5 MG/3ML) 0.083% IN NEBU: 2.5000 mg | INHALATION_SOLUTION | RESPIRATORY_TRACT | Status: DC | PRN

## 2020-12-09 MED ORDER — BUDESON-GLYCOPYRROL-FORMOTEROL 160-9-4.8 MCG/ACT IN AERO: 2.0000 | INHALATION_SPRAY | Freq: Two times a day (BID) | RESPIRATORY_TRACT | Status: DC

## 2020-12-09 MED ORDER — IPRATROPIUM-ALBUTEROL 0.5-2.5 (3) MG/3ML IN SOLN
3.0000 mL | Freq: Once | RESPIRATORY_TRACT | Status: AC
  Administered 2020-12-09: 3 mL via RESPIRATORY_TRACT
  Filled 2020-12-09: qty 3

## 2020-12-09 MED ORDER — PANTOPRAZOLE SODIUM 40 MG PO TBEC
40.0000 mg | DELAYED_RELEASE_TABLET | Freq: Every day | ORAL | Status: DC
  Administered 2020-12-10 – 2020-12-12 (×3): 40 mg via ORAL
  Filled 2020-12-09 (×3): qty 1

## 2020-12-09 MED ORDER — PREDNISONE 20 MG PO TABS
40.0000 mg | ORAL_TABLET | Freq: Every day | ORAL | Status: DC
  Administered 2020-12-10: 40 mg via ORAL
  Filled 2020-12-09: qty 2

## 2020-12-09 MED ORDER — AZITHROMYCIN 250 MG PO TABS
500.0000 mg | ORAL_TABLET | Freq: Every day | ORAL | Status: AC
  Administered 2020-12-10: 500 mg via ORAL
  Filled 2020-12-09: qty 2

## 2020-12-09 MED ORDER — ASPIRIN EC 81 MG PO TBEC
81.0000 mg | DELAYED_RELEASE_TABLET | Freq: Every day | ORAL | Status: DC
  Administered 2020-12-10 – 2020-12-11 (×3): 81 mg via ORAL
  Filled 2020-12-09 (×3): qty 1

## 2020-12-09 MED ORDER — ACETAMINOPHEN 325 MG PO TABS: 650.0000 mg | ORAL_TABLET | Freq: Four times a day (QID) | ORAL | Status: DC | PRN

## 2020-12-09 MED ORDER — SODIUM CHLORIDE 0.9% FLUSH
3.0000 mL | Freq: Two times a day (BID) | INTRAVENOUS | Status: DC
  Administered 2020-12-09 – 2020-12-12 (×6): 3 mL via INTRAVENOUS

## 2020-12-09 MED ORDER — IPRATROPIUM-ALBUTEROL 0.5-2.5 (3) MG/3ML IN SOLN
3.0000 mL | Freq: Four times a day (QID) | RESPIRATORY_TRACT | Status: DC
  Administered 2020-12-10: 3 mL via RESPIRATORY_TRACT
  Filled 2020-12-09: qty 3

## 2020-12-09 MED ORDER — ACETAMINOPHEN 650 MG RE SUPP: 650.0000 mg | Freq: Four times a day (QID) | RECTAL | Status: DC | PRN

## 2020-12-09 MED ORDER — LORAZEPAM 0.5 MG PO TABS
0.5000 mg | ORAL_TABLET | Freq: Two times a day (BID) | ORAL | Status: DC | PRN
  Administered 2020-12-10: 0.5 mg via ORAL
  Filled 2020-12-09: qty 1

## 2020-12-09 MED ORDER — IOHEXOL 350 MG/ML SOLN
75.0000 mL | Freq: Once | INTRAVENOUS | Status: AC | PRN
  Administered 2020-12-09: 75 mL via INTRAVENOUS

## 2020-12-09 NOTE — ED Triage Notes (Signed)
Pt with intermittent spells of shob from Friday night until yesterday, and then constant symptoms since last night. Endorses palpitations and feeling hot. Hx of COPD and normally wears 2.5L at home. On arrival SpO2 83% on 3L. Increased to 6L with improvement to 90%.

## 2020-12-09 NOTE — Telephone Encounter (Signed)
Called and spoke with pt's significant other Lelon Frohlich (is on [place]) who stated that pt is really struggling to breathe. She said that pt has been very SOB for 2 days, is breathing very hard, and also is having chest pains.  Lelon Frohlich said that pt is using 2.5L O2 but even on the O2 he is really struggling.  Stated to her if pt is having that hard of a time breathing that he needs to seek emergent care either at [facility] or ED and she verbalized understanding. Nothing further needed.

## 2020-12-09 NOTE — ED Provider Notes (Signed)
[facility] Provider Note   CSN: [id number] Arrival date & time: 12/09/20  1723     History Chief Complaint  Patient presents with  . Shortness of Breath    Zachary Chambers is a 70 y.o. male.  History of COPD on home oxygen of 2 L.  He does have exertional hypoxia even on 2 L and will require up to 5 L.  He also has a history of CAD and CVA.  He presents with increased O2 requirement and dyspnea.  The history is provided by the patient.  Shortness of Breath Severity:  Severe Onset quality:  Gradual Duration:  1 day Timing:  Constant Progression:  Worsening Chronicity:  Recurrent Context: not URI   Relieved by:  Oxygen Worsened by:  Activity Ineffective treatments:  Inhaler Associated symptoms: cough   Associated symptoms: no abdominal pain, no chest pain, no ear pain, no fever, no rash, no sore throat and no vomiting        Past Medical History:  Diagnosis Date  . COPD (chronic obstructive pulmonary disease) (HCC)    not on home O2  . DJD (degenerative joint disease)    diffusely  . GERD (gastroesophageal reflux disease)   . Hyperlipidemia   . MI (myocardial infarction) ([place]) 2006   small MI and stents x 3  Dr. Doreatha Lew  . PUD (peptic ulcer disease)    with bleeding  . Rotator cuff disorder    has been evaluated by Dr Clifton James and Koleen Distance    Patient Active Problem List   Diagnosis Date Noted  . Anxiety 10/07/2020  . History of CVA with residual deficit 10/07/2020  . Pneumonia due to COVID-19 virus 07/05/2020  . Chronic respiratory failure ([place]) 07/05/2020  . Transient atrial fibrillation ([place]) 07/05/2020  . Dyspnea 07/04/2020  . Protein-calorie malnutrition, severe 03/07/2020  . S/P coronary artery stent placement   . Chest pain 03/06/2020  . Dyslipidemia 03/06/2020  . Healthcare maintenance 03/05/2020  . Heavy alcohol use 03/04/2018  . Memory changes 03/04/2018  . Leukopenia 05/04/2017  . Bruit 01/22/2014  .  Muscle spasm of back 09/20/2013  . COPD (chronic obstructive pulmonary disease) ([place]) 01/14/2011  . CAD (coronary artery disease) 01/14/2011  . GERD (gastroesophageal reflux disease) 01/14/2011  . TOBACCO ABUSE 07/25/2009  . Osteoarthritis 07/25/2009  . ROTATOR CUFF SYNDROME, RIGHT 07/25/2009    Past Surgical History:  Procedure Laterality Date  . COLONOSCOPY    . EYE SURGERY     right eye removed at age 38  . heart stent     x3- 2006       Family History  Problem Relation Age of Onset  . Ovarian cancer Mother   . Diabetes Maternal Grandfather   . COPD Father   . Diabetes Paternal Grandfather   . Colon cancer Paternal Grandfather   . Esophageal cancer Neg Hx   . Rectal cancer Neg Hx   . Stomach cancer Neg Hx     Social History   Tobacco Use  . Smoking status: Former Smoker    Packs/day: 1.00    Years: 40.00    Pack years: 40.00    Types: Cigarettes  . Smokeless tobacco: Never Used  . Tobacco comment: declines patch  Vaping Use  . Vaping Use: Never used  Substance Use Topics  . Alcohol use: Yes    Alcohol/week: 30.0 standard drinks    Types: 30 Cans of beer per week    Comment: h/o heavy  use, now 4 beers per day  . Drug use: Not Currently    Home Medications Prior to Admission medications   Medication Sig Start Date End Date Taking? Authorizing Provider  albuterol (PROVENTIL) (2.5 MG/3ML) 0.083% nebulizer solution Take 3 mLs (2.5 mg total) by nebulization every 4 (four) hours as needed for wheezing or shortness of breath. 01/17/19   [place], Fonnie Mu, NP  albuterol (VENTOLIN HFA) 108 (90 Base) MCG/ACT inhaler INHALE 2 PUFFS INTO THE LUNGS EVERY 4 HOURS AS NEEDED FOR WHEEZING AND SHORTNESS OF BREATH 11/18/20   [place], Modena Nunnery, MD  apixaban (ELIQUIS) 2.5 MG TABS tablet Take 1 tablet (2.5 mg total) by mouth 2 (two) times daily. 08/30/20   [name], MD  aspirin 81 MG tablet Take 81 mg by mouth at bedtime.    [provider]  atorvastatin (LIPITOR)  40 MG tablet Take 1 tablet (40 mg total) by mouth at bedtime. 10/14/20   [name], MD  benzonatate (TESSALON) 100 MG capsule Take 1 capsule (100 mg total) by mouth 2 (two) times daily as needed for cough. 10/07/20   [name], MD  Budeson-Glycopyrrol-Formoterol (BREZTRI AEROSPHERE) 160-9-4.8 MCG/ACT AERO Inhale 2 puffs into the lungs 2 (two) times daily. 03/12/20   Lauraine Rinne, NP  Budeson-Glycopyrrol-Formoterol (BREZTRI AEROSPHERE) 160-9-4.8 MCG/ACT AERO Inhale 2 puffs into the lungs in the morning and at bedtime. 10/21/20   Magdalen Spatz, NP  budesonide-formoterol (SYMBICORT) 160-4.5 MCG/ACT inhaler Inhale 2 puffs into the lungs 2 (two) times daily. 11/01/19   Lauraine Rinne, NP  celecoxib (CELEBREX) 100 MG capsule Take 100 mg by mouth in the morning and at bedtime. 06/03/20   [provider]  fluticasone (FLONASE) 50 MCG/ACT nasal spray USE 2 SPRAYS in [facility] NOSTRIL EVERY DAY 04/17/20   [place], Modena Nunnery, MD  folic acid (FOLVITE) 1 MG tablet TAKE 1 TABLET BY MOUTH EVERY DAY 11/11/20   [place], Modena Nunnery, MD  Glucosamine HCl (GLUCOSAMINE PO) Take 1,500 mg by mouth daily.    [provider]  hydrOXYzine (ATARAX/VISTARIL) 10 MG tablet TAKE 1 TABLET BY MOUTH 3 TIMES DAILY AS NEEDED 12/22/18   [place], Modena Nunnery, MD  LORazepam (ATIVAN) 0.5 MG tablet Take 1 tablet (0.5 mg total) by mouth 2 (two) times daily as needed for anxiety. 10/14/20   [name], MD  Multiple Vitamin (MULTIVITAMIN WITH MINERALS) TABS tablet Take 1 tablet by mouth daily. 03/09/20   Kayleen Memos, DO  nitroGLYCERIN (NITROSTAT) 0.4 MG SL tablet dissolve 1 TABLET UNDER THE TONGUE every 5 minutes AS NEEDED CHEST PAIN 09/12/20   [name], MD  omeprazole (PRILOSEC) 40 MG capsule Take 1 capsule (40 mg total) by mouth daily. 10/14/20   [name], MD  tiZANidine (ZANAFLEX) 4 MG tablet TAKE 1 TABLET BY MOUTH EVERY EIGHT HOURS AS NEEDED FOR MUSCLE SPASMS 11/27/19   [place], Modena Nunnery, MD  traMADol  (ULTRAM) 50 MG tablet TAKE 1 TABLET BY MOUTH EVERY 6 HOURS AS NEEDED FOR moderate PAIN 07/07/19   [name], MD  zinc gluconate 50 MG tablet Take 50 mg by mouth daily.    [provider]    Allergies    Patient has no known allergies.  Review of Systems   Review of Systems  Constitutional: Negative for chills and fever.  HENT: Negative for ear pain and sore throat.   Eyes: Negative for pain and visual disturbance.  Respiratory: Positive for cough and shortness of  breath.   Cardiovascular: Negative for chest pain and palpitations.  Gastrointestinal: Negative for abdominal pain and vomiting.  Genitourinary: Negative for dysuria and hematuria.  Musculoskeletal: Negative for arthralgias and back pain.  Skin: Negative for color change and rash.  Neurological: Negative for seizures and syncope.  All other systems reviewed and are negative.   Physical Exam Updated Vital Signs BP (!) 200/120 (BP Location: Left Arm)   Pulse (!) 134   Temp 98.3 F (36.8 C) (Oral)   Resp (!) 26   SpO2 91%   Physical Exam Vitals and nursing note reviewed.  Constitutional:      General: He is in acute distress.     Appearance: He is well-developed. He is ill-appearing.  HENT:     Head: Normocephalic and atraumatic.  Eyes:     Conjunctiva/sclera: Conjunctivae normal.  Cardiovascular:     Rate and Rhythm: Regular rhythm. Tachycardia present.     Heart sounds: No murmur heard.   Pulmonary:     Effort: Tachypnea, accessory muscle usage and respiratory distress present.     Breath sounds: Examination of the right-upper field reveals decreased breath sounds. Examination of the left-upper field reveals decreased breath sounds. Examination of the right-middle field reveals decreased breath sounds. Examination of the left-middle field reveals decreased breath sounds. Examination of the right-lower field reveals decreased breath sounds. Examination of the left-lower field reveals decreased  breath sounds. Decreased breath sounds present. No wheezing, rhonchi or rales.  Chest:     Chest wall: No crepitus.     Comments: Barrel shaped Abdominal:     Palpations: Abdomen is soft.     Tenderness: There is no abdominal tenderness.  Musculoskeletal:     Cervical back: Normal range of motion and neck supple.     Right lower leg: No edema.     Left lower leg: No edema.  Skin:    General: Skin is warm and dry.  Neurological:     Mental Status: He is alert.     ED Results / Procedures / Treatments   Labs (all labs ordered are listed, but only abnormal results are displayed) Labs Reviewed  CBC WITH DIFFERENTIAL/PLATELET - Abnormal; Notable for the following components:      Result Value   WBC 18.3 (*)    RBC 6.09 (*)    HCT 52.3 (*)    Neutro Abs 15.9 (*)    Lymphs Abs 0.5 (*)    Monocytes Absolute 1.6 (*)    Abs Immature Granulocytes 0.25 (*)    All other components within normal limits  BASIC METABOLIC PANEL - Abnormal; Notable for the following components:   Sodium 134 (*)    Chloride 95 (*)    Glucose, Bld 112 (*)    All other components within normal limits  D-DIMER, QUANTITATIVE - Abnormal; Notable for the following components:   D-Dimer, Quant 0.93 (*)    All other components within normal limits  I-STAT ARTERIAL BLOOD GAS, ED - Abnormal; Notable for the following components:   pCO2 arterial 48.5 (*)    Bicarbonate 29.9 (*)    Acid-Base Excess 4.0 (*)    All other components within normal limits  TROPONIN I (HIGH SENSITIVITY) - Abnormal; Notable for the following components:   Troponin I (High Sensitivity) 333 (*)    All other components within normal limits  TROPONIN I (HIGH SENSITIVITY) - Abnormal; Notable for the following components:   Troponin I (High Sensitivity) 272 (*)    All other  components within normal limits  RESP PANEL BY RT-PCR (FLU A&B, COVID) ARPGX2  BLOOD GAS, ARTERIAL    EKG EKG Interpretation  Date/Time:  Monday December 09 2020  17:32:36 EDT Ventricular Rate:  126 PR Interval:  138 QRS Duration: 74 QT Interval:  294 QTC Calculation: 425 R Axis:   81 Text Interpretation: Sinus tachycardia Nonspecific ST abnormality Abnormal ECG no acute ischemia Confirmed by Lorre Munroe ([id number]) on 12/09/2020 6:22:27 PM   Radiology CT Angio Chest PE W and/or Wo Contrast  Result Date: 12/09/2020 CLINICAL DATA:  Pulmonary embolus suspected with low to intermediate probability. Positive D-dimer. Shortness of breath and palpitations. EXAM: CT ANGIOGRAPHY CHEST WITH CONTRAST TECHNIQUE: Multidetector CT imaging of the chest was performed using the standard protocol during bolus administration of intravenous contrast. Multiplanar CT image reconstructions and MIPs were obtained to evaluate the vascular anatomy. CONTRAST:  20mL OMNIPAQUE IOHEXOL 350 MG/ML SOLN COMPARISON:  07/04/2020 FINDINGS: Cardiovascular: Good opacification of the central and segmental pulmonary arteries. No focal filling defects. No evidence of significant pulmonary embolus. Normal heart size. Small pericardial effusion. Coronary artery and aortic calcifications. No aortic aneurysm. Great vessel origins are patent. Mediastinum/Nodes: Esophagus is decompressed. No significant lymphadenopathy. Lungs/Pleura: Emphysematous changes in the lungs. Bilateral basilar atelectasis. No pleural effusions. No pneumothorax. Airways are patent. Upper Abdomen: No acute abnormalities demonstrated in the visualized upper abdomen. Musculoskeletal: No chest wall abnormality. No acute or significant osseous findings. Review of the MIP images confirms the above findings. IMPRESSION: 1. No evidence of significant pulmonary embolus. 2. Small pericardial effusion. 3. Emphysematous changes in the lungs. 4. Bilateral basilar atelectasis. 5. Emphysema and aortic atherosclerosis. Aortic Atherosclerosis (ICD10-I70.0) and Emphysema (ICD10-J43.9). Electronically Signed   By: Lucienne Capers M.D.   On: 12/09/2020  21:25   DG Chest Port 1 View  Result Date: 12/09/2020 CLINICAL DATA:  Dyspnea EXAM: PORTABLE CHEST 1 VIEW COMPARISON:  July 04, 2020 FINDINGS: The heart size and mediastinal contours are within normal limits. Scarring in the lung bases. No focal consolidation. No significant pleural effusion or visible pneumothorax. The visualized skeletal structures are unremarkable. IMPRESSION: No active disease. Electronically Signed   By: Dahlia Bailiff MD   On: 12/09/2020 18:39    Procedures Procedures   Medications Ordered in ED Medications  dexamethasone (DECADRON) injection 10 mg (10 mg Intravenous Given 12/09/20 1754)  ipratropium-albuterol (DUONEB) 0.5-2.5 (3) MG/3ML nebulizer solution 3 mL (3 mLs Nebulization Given 12/09/20 1812)  iohexol (OMNIPAQUE) 350 MG/ML injection 75 mL (75 mLs Intravenous Contrast Given 12/09/20 2117)    ED Course  I have reviewed the triage vital signs and the nursing notes.  Pertinent labs & imaging results that were available during my care of the patient were reviewed by me and considered in my medical decision making (see chart for details).  Clinical Course as of 12/09/20 2312  Mon Dec 09, 2020  2240 I spoke with Dr. Alfred Levins. Burtis Junes demand ischemia. Trend troponins. Admit to [facility]. [AW]  2310 TRH consulted and will admit.  [AW]    Clinical Course User Index [AW] Arnaldo Natal, MD   MDM Rules/Calculators/A&P                          Roberta Elspeth Cho presented to the ED and respiratory distress.  He was also quite hypertensive and tachycardic.  I placed him on BiPAP immediately, and this produced dramatic resolution of his symptoms.  Blood gas recorded in the chart was actually performed  after he had been on BiPAP for some time.  He is currently back to 4 L which is greater than his 2 m typical requirement.  ED evaluation failed to show pneumonia, PE, or other underlying pulmonary process aside from his COPD.  Troponin is slightly elevated but downtrending.  Likely demand  ischemia though I did consider hypertensive emergency and NSTEMI. No anti-coagulation given the likely pulmonary etiology of his troponin elevation.  The patient will be admitted to the hospital for further observation. Final Clinical Impression(s) / ED Diagnoses Final diagnoses:  COPD exacerbation ([place])  NSTEMI (non-ST elevated myocardial infarction) [facility])    Rx / DC Orders ED Discharge Orders    None       Arnaldo Natal, MD 12/09/20 2312

## 2020-12-09 NOTE — ED Notes (Signed)
RT at bedside. Pt to go on BiPap

## 2020-12-09 NOTE — H&P (Addendum)
History and Physical   CARDER BELOW [id number] DOB: 1950/10/17 DOA: 12/09/2020  PCP: [name], MD   Patient coming from: Home  Chief Complaint: Shortness of breath and chest pain  HPI: Zachary Chambers is a 70 y.o. male with medical history significant of anxiety, CAD, COPD, hyperlipidemia, GERD, alcohol use, CVA, A. fib presenting with shortness of breath.  Patient has had 2 days of shortness of breath; initially was intermittent and has become persistent at this time.  Also had some associated palpitations and feeling hot.  He uses 2.5 L at baseline for his chronic respiratory failure due to COPD.  He had no relief even on his oxygen.  He was initially saturating 83% when seen on 3 L.  This improved to 90% with 6 L.  He reports some increased sputum production.  He states he did not try his nebulizer.  He denies fevers, chills, abdominal pain, constipation, diarrhea, nausea, vomiting.   ED Course: Vital signs in the ED significant for initial blood pressure in the 123456 systolic with improvement while treated in the ED now in the Q000111Q 0000000 systolic.  Heart rate initially in the 110s and now in the 90s.  Patient initially required 6 to 15 L of oxygen and then was placed on BiPAP and now has been weaned to 4 L by nasal cannula.  Lab work-up showed sodium 134, chloride 95, glucose 112.  CBC with leukocytosis to 18.3.  Rest were panel for flu and COVID-negative.  Troponin elevated at 333 with repeat pending.  D-dimer elevated to 0.93.  Chest x-ray without acute abnormality.  CT PE showed no PE, small pericardial effusions, emphysematous changes, basilar atelectasis.  ABG with pH 7.39, PCO2 48.5.  Cardiology was consulted in the ED due to elevated troponin and suspect his elevation is due to demand ischemia.  Patient received Decadron and duo nebs in the ED in addition to his BiPAP.  Review of Systems: As per HPI otherwise all other systems reviewed and are negative.  Past Medical History:   Diagnosis Date  . COPD (chronic obstructive pulmonary disease) (HCC)    not on home O2  . DJD (degenerative joint disease)    diffusely  . GERD (gastroesophageal reflux disease)   . Hyperlipidemia   . MI (myocardial infarction) ([place]) 2006   small MI and stents x 3  Dr. Doreatha Lew  . PUD (peptic ulcer disease)    with bleeding  . Rotator cuff disorder    has been evaluated by Dr Clifton James and Koleen Distance    Past Surgical History:  Procedure Laterality Date  . COLONOSCOPY    . EYE SURGERY     right eye removed at age 64  . heart stent     x3- 2006    Social History  reports that he has quit smoking. His smoking use included cigarettes. He has a 40.00 pack-year smoking history. He has never used smokeless tobacco. He reports current alcohol use of about 30.0 standard drinks of alcohol per week. He reports previous drug use.  No Known Allergies  Family History  Problem Relation Age of Onset  . Ovarian cancer Mother   . Diabetes Maternal Grandfather   . COPD Father   . Diabetes Paternal Grandfather   . Colon cancer Paternal Grandfather   . Esophageal cancer Neg Hx   . Rectal cancer Neg Hx   . Stomach cancer Neg Hx   Reviewed on admission  Prior to Admission medications   Medication  Sig Start Date End Date Taking? Authorizing Provider  albuterol (PROVENTIL) (2.5 MG/3ML) 0.083% nebulizer solution Take 3 mLs (2.5 mg total) by nebulization every 4 (four) hours as needed for wheezing or shortness of breath. 01/17/19   Parrett, Fonnie Mu, NP  albuterol (VENTOLIN HFA) 108 (90 Base) MCG/ACT inhaler INHALE 2 PUFFS INTO THE LUNGS EVERY 4 HOURS AS NEEDED FOR WHEEZING AND SHORTNESS OF BREATH 11/18/20   Millard, Modena Nunnery, MD  apixaban (ELIQUIS) 2.5 MG TABS tablet Take 1 tablet (2.5 mg total) by mouth 2 (two) times daily. 08/30/20   [name], MD  aspirin 81 MG tablet Take 81 mg by mouth at bedtime.    [provider]  atorvastatin (LIPITOR) 40 MG tablet Take 1 tablet (40 mg total)  by mouth at bedtime. 10/14/20   [name], MD  benzonatate (TESSALON) 100 MG capsule Take 1 capsule (100 mg total) by mouth 2 (two) times daily as needed for cough. 10/07/20   [name], MD  Budeson-Glycopyrrol-Formoterol (BREZTRI AEROSPHERE) 160-9-4.8 MCG/ACT AERO Inhale 2 puffs into the lungs 2 (two) times daily. 03/12/20   Lauraine Rinne, NP  Budeson-Glycopyrrol-Formoterol (BREZTRI AEROSPHERE) 160-9-4.8 MCG/ACT AERO Inhale 2 puffs into the lungs in the morning and at bedtime. 10/21/20   Magdalen Spatz, NP  budesonide-formoterol (SYMBICORT) 160-4.5 MCG/ACT inhaler Inhale 2 puffs into the lungs 2 (two) times daily. 11/01/19   Lauraine Rinne, NP  celecoxib (CELEBREX) 100 MG capsule Take 100 mg by mouth in the morning and at bedtime. 06/03/20   [provider]  fluticasone (FLONASE) 50 MCG/ACT nasal spray USE 2 SPRAYS in [facility] NOSTRIL EVERY DAY 04/17/20   Naknek, Modena Nunnery, MD  folic acid (FOLVITE) 1 MG tablet TAKE 1 TABLET BY MOUTH EVERY DAY 11/11/20   Graves, Modena Nunnery, MD  Glucosamine HCl (GLUCOSAMINE PO) Take 1,500 mg by mouth daily.    [provider]  hydrOXYzine (ATARAX/VISTARIL) 10 MG tablet TAKE 1 TABLET BY MOUTH 3 TIMES DAILY AS NEEDED 12/22/18   Catano, Modena Nunnery, MD  LORazepam (ATIVAN) 0.5 MG tablet Take 1 tablet (0.5 mg total) by mouth 2 (two) times daily as needed for anxiety. 10/14/20   [name], MD  Multiple Vitamin (MULTIVITAMIN WITH MINERALS) TABS tablet Take 1 tablet by mouth daily. 03/09/20   Kayleen Memos, DO  nitroGLYCERIN (NITROSTAT) 0.4 MG SL tablet dissolve 1 TABLET UNDER THE TONGUE every 5 minutes AS NEEDED CHEST PAIN 09/12/20   [name], MD  omeprazole (PRILOSEC) 40 MG capsule Take 1 capsule (40 mg total) by mouth daily. 10/14/20   [name], MD  tiZANidine (ZANAFLEX) 4 MG tablet TAKE 1 TABLET BY MOUTH EVERY EIGHT HOURS AS NEEDED FOR MUSCLE SPASMS 11/27/19   Vermillion, Modena Nunnery, MD  traMADol (ULTRAM) 50 MG tablet TAKE 1 TABLET BY MOUTH  EVERY 6 HOURS AS NEEDED FOR moderate PAIN 07/07/19   [name], MD  zinc gluconate 50 MG tablet Take 50 mg by mouth daily.    [provider]    Physical Exam: Vitals:   12/09/20 2130 12/09/20 2145 12/09/20 2200 12/09/20 2215  BP: (!) 141/76 138/72 138/72 130/84  Pulse: 99 98 96 95  Resp: 15 19 20 17   Temp:      TempSrc:      SpO2: 96% 97% 96% 96%  Weight:      Height:       Physical Exam Constitutional:      General: He is  not in acute distress.    Appearance: Normal appearance.  HENT:     Head: Normocephalic and atraumatic.     Mouth/Throat:     Mouth: Mucous membranes are moist.     Pharynx: Oropharynx is clear.  Eyes:     Extraocular Movements: Extraocular movements intact.     Pupils: Pupils are equal, round, and reactive to light.  Cardiovascular:     Rate and Rhythm: Normal rate and regular rhythm.     Pulses: Normal pulses.     Heart sounds: Normal heart sounds.  Pulmonary:     Effort: Pulmonary effort is normal. No respiratory distress.     Breath sounds: Rhonchi present.  Abdominal:     General: Bowel sounds are normal. There is no distension.     Palpations: Abdomen is soft.     Tenderness: There is no abdominal tenderness.  Musculoskeletal:        General: No swelling or deformity.  Skin:    General: Skin is warm and dry.  Neurological:     General: No focal deficit present.     Mental Status: Mental status is at baseline.    Labs on Admission: I have personally reviewed following labs and imaging studies  CBC: Recent Labs  Lab 12/09/20 1729 12/09/20 1848  WBC 18.3*  --   NEUTROABS 15.9*  --   HGB 16.4 16.7  HCT 52.3* 49.0  MCV 85.9  --   PLT 325  --     Basic Metabolic Panel: Recent Labs  Lab 12/09/20 1729 12/09/20 1848  NA 134* 136  K 3.9 3.8  CL 95*  --   CO2 25  --   GLUCOSE 112*  --   BUN 16  --   CREATININE 1.02  --   CALCIUM 9.5  --     GFR: Estimated Creatinine Clearance: 52.4 mL/min (by C-G formula  based on SCr of 1.02 mg/dL).  Liver Function Tests: No results for input(s): AST, ALT, ALKPHOS, BILITOT, PROT, ALBUMIN in the last 168 hours.  Urine analysis: No results found for: COLORURINE, APPEARANCEUR, LABSPEC, PHURINE, GLUCOSEU, HGBUR, BILIRUBINUR, KETONESUR, PROTEINUR, UROBILINOGEN, NITRITE, LEUKOCYTESUR  Radiological Exams on Admission: CT Angio Chest PE W and/or Wo Contrast  Result Date: 12/09/2020 CLINICAL DATA:  Pulmonary embolus suspected with low to intermediate probability. Positive D-dimer. Shortness of breath and palpitations. EXAM: CT ANGIOGRAPHY CHEST WITH CONTRAST TECHNIQUE: Multidetector CT imaging of the chest was performed using the standard protocol during bolus administration of intravenous contrast. Multiplanar CT image reconstructions and MIPs were obtained to evaluate the vascular anatomy. CONTRAST:  76mL OMNIPAQUE IOHEXOL 350 MG/ML SOLN COMPARISON:  07/04/2020 FINDINGS: Cardiovascular: Good opacification of the central and segmental pulmonary arteries. No focal filling defects. No evidence of significant pulmonary embolus. Normal heart size. Small pericardial effusion. Coronary artery and aortic calcifications. No aortic aneurysm. Great vessel origins are patent. Mediastinum/Nodes: Esophagus is decompressed. No significant lymphadenopathy. Lungs/Pleura: Emphysematous changes in the lungs. Bilateral basilar atelectasis. No pleural effusions. No pneumothorax. Airways are patent. Upper Abdomen: No acute abnormalities demonstrated in the visualized upper abdomen. Musculoskeletal: No chest wall abnormality. No acute or significant osseous findings. Review of the MIP images confirms the above findings. IMPRESSION: 1. No evidence of significant pulmonary embolus. 2. Small pericardial effusion. 3. Emphysematous changes in the lungs. 4. Bilateral basilar atelectasis. 5. Emphysema and aortic atherosclerosis. Aortic Atherosclerosis (ICD10-I70.0) and Emphysema (ICD10-J43.9). Electronically  Signed   By: Lucienne Capers M.D.   On: 12/09/2020 21:25   DG  Chest Port 1 View  Result Date: 12/09/2020 CLINICAL DATA:  Dyspnea EXAM: PORTABLE CHEST 1 VIEW COMPARISON:  July 04, 2020 FINDINGS: The heart size and mediastinal contours are within normal limits. Scarring in the lung bases. No focal consolidation. No significant pleural effusion or visible pneumothorax. The visualized skeletal structures are unremarkable. IMPRESSION: No active disease. Electronically Signed   By: Dahlia Bailiff MD   On: 12/09/2020 18:39   EKG: Independently reviewed.  Sinus tachycardia at 126 bpm.  Nonspecific T wave abnormalities in V1, lead III.  Baseline artifact and baseline wander.  Assessment/Plan Principal Problem:   COPD exacerbation (HCC) Active Problems:   CAD (coronary artery disease)   GERD (gastroesophageal reflux disease)   Heavy alcohol use   Dyslipidemia   S/P coronary artery stent placement   Transient atrial fibrillation (HCC)   Anxiety   History of CVA with residual deficit   Acute on chronic respiratory failure with hypoxia (HCC)  COPD exacerbation Acute on chronic respiratory failure with hypoxia > Patient with 2 days of intermittent shortness of breath that has become persistent and with associated chest pain. > On arrival patient noted to have increasing oxygen requirements.  Uses 2 L at baseline and up to 5 with exertion.  He was at 83% on 3 L initially with improved to 90% on 6 L and eventually needed to be placed on BiPAP.  Weaned off BiPAP on 4 L now. > ABG looks okay but this was obtained after an hour or so of therapy. > Chest x-ray without acute unreality and CT PE without any signs of PE did show small pericardial effusion and emphysematous changes with basilar atelectasis. > Leukocytosis to 18.3 although this may be reactive because there is no signs of infection at this time. > Received dose of steroids and DuoNeb in addition to BiPAP in the ED. > States he takes both  [place] and Symbicort at home but it is usually 1 the other.  He is not sure which he is supposed to take or if he is supposed to be taking both. - Monitor in progressive with as needed BiPAP - Continue daily steroids - Scheduled duo nebs - As needed albuterol - Azithromycin due to increased sputum production - BiPAP as needed - Continue home Breztri - Add on RVP  CAD Chest pain Demand ischemia > Patient has history of CAD status post stenting > He had chest pain along with his shortness of breath recently. > Troponin elevated to 333 in the ED with repeat pending.  Suspected to be demand ischemia in the setting of his significant COPD exacerbation possibility of some hypertensive urgency/emergency given he was initially hypertensive to the 200s on presentation but this has improved significantly (this seems less likely as his chest x-ray showed no sign of pulmonary edema). > Cardiology was consulted by EDP who suspected demand ischemia as well. > Unclear if cardiology was planning to see the patient, if so appreciate recommendations - Trend troponin - Continue home aspirin - Continue home as needed nitro - Continue home atorvastatin  Hypertension > Severely hypertensive on arrival to the 123456 systolic with improvement in the ED down to the Q000111Q to 0000000 systolic. - No antihypertensives prescribed outpatient.  Hyperlipidemia - Continue home atorvastatin  CVA > With residual right sided deficits - Continue home aspirin and atorvastatin  GERD - Continue home PPI  Paroxysmal A. Fib - Continue home Eliquis  Anxiety - Continue home as needed Ativan  Alcohol Use > Just  occasional now  DVT prophylaxis: Lovenox  Code Status:   Partial code.  No CPR/ACLS, but would want BiPAP and intubation if needed  Family Communication:  Spoke with fiance who is POA and updated her.  Disposition Plan:   Patient is from:  Home  Anticipated DC to:  Home  Anticipated DC date:  1 to 5  days  Anticipated DC barriers: None  Consults called:  Cardiology consulted by EDP.  Unclear if they are following.   Admission status:  Observation, progressive  Severity of Illness: The appropriate patient status for this patient is OBSERVATION. Observation status is judged to be reasonable and necessary in order to provide the required intensity of service to ensure the patient's safety. The patient's presenting symptoms, physical exam findings, and initial radiographic and laboratory data in the context of their medical condition is felt to place them at decreased risk for further clinical deterioration. Furthermore, it is anticipated that the patient will be medically stable for discharge from the hospital within 2 midnights of admission. The following factors support the patient status of observation.   " The patient's presenting symptoms include shortness of breath, chest pain. " The physical exam findings include rhonchi, otherwise stable. " The initial radiographic and laboratory data are leukocytosis to 18.3.  Troponin 333.  D-dimer 0.93.  CT PE with small pericardial effusion, emphysematous changes, basilar atelectasis, no PE.  ABG pH 7.39, PCO2 48.5.Marland Kitchen   Marcelyn Bruins MD [facility]  How to contact the [facility] Attending or Consulting provider [place] or covering provider during after hours [place], for this patient?   1. Check the care team in [facility] and look for a) attending/consulting [facility] provider listed and b) the [facility] team listed 2. Log into [email or web address] and use [place]'s universal password to access. If you do not have the password, please contact the hospital operator. 3. Locate the [facility] provider you are looking for under [facility] and page to a number that you can be directly reached. 4. If you still have difficulty reaching the provider, please page the [facility] (Director on Call) for the Hospitalists listed on amion for assistance.  12/09/2020, 11:12 PM

## 2020-12-09 NOTE — Progress Notes (Signed)
RT took patient off BIPAP and placed on 4L [place] at this time. VSS. No respiratory distress noted at this time.  MD and RN are aware. RT will monitor as needed.

## 2020-12-10 ENCOUNTER — Observation Stay (HOSPITAL_COMMUNITY): Payer: Medicare HMO

## 2020-12-10 DIAGNOSIS — Z9981 Dependence on supplemental oxygen: Secondary | ICD-10-CM | POA: Diagnosis not present

## 2020-12-10 DIAGNOSIS — E785 Hyperlipidemia, unspecified: Secondary | ICD-10-CM

## 2020-12-10 DIAGNOSIS — I6930 Unspecified sequelae of cerebral infarction: Secondary | ICD-10-CM | POA: Diagnosis not present

## 2020-12-10 DIAGNOSIS — Z7901 Long term (current) use of anticoagulants: Secondary | ICD-10-CM | POA: Diagnosis not present

## 2020-12-10 DIAGNOSIS — J9621 Acute and chronic respiratory failure with hypoxia: Secondary | ICD-10-CM | POA: Diagnosis not present

## 2020-12-10 DIAGNOSIS — I25110 Atherosclerotic heart disease of native coronary artery with unstable angina pectoris: Secondary | ICD-10-CM

## 2020-12-10 DIAGNOSIS — K219 Gastro-esophageal reflux disease without esophagitis: Secondary | ICD-10-CM | POA: Diagnosis not present

## 2020-12-10 DIAGNOSIS — Z79899 Other long term (current) drug therapy: Secondary | ICD-10-CM | POA: Diagnosis not present

## 2020-12-10 DIAGNOSIS — I69351 Hemiplegia and hemiparesis following cerebral infarction affecting right dominant side: Secondary | ICD-10-CM | POA: Diagnosis not present

## 2020-12-10 DIAGNOSIS — Z7982 Long term (current) use of aspirin: Secondary | ICD-10-CM | POA: Diagnosis not present

## 2020-12-10 DIAGNOSIS — I480 Paroxysmal atrial fibrillation: Secondary | ICD-10-CM | POA: Diagnosis present

## 2020-12-10 DIAGNOSIS — R0609 Other forms of dyspnea: Secondary | ICD-10-CM

## 2020-12-10 DIAGNOSIS — Z955 Presence of coronary angioplasty implant and graft: Secondary | ICD-10-CM | POA: Diagnosis not present

## 2020-12-10 DIAGNOSIS — F419 Anxiety disorder, unspecified: Secondary | ICD-10-CM

## 2020-12-10 DIAGNOSIS — I252 Old myocardial infarction: Secondary | ICD-10-CM | POA: Diagnosis not present

## 2020-12-10 DIAGNOSIS — I4891 Unspecified atrial fibrillation: Secondary | ICD-10-CM | POA: Diagnosis not present

## 2020-12-10 DIAGNOSIS — Z8711 Personal history of peptic ulcer disease: Secondary | ICD-10-CM | POA: Diagnosis not present

## 2020-12-10 DIAGNOSIS — Z87891 Personal history of nicotine dependence: Secondary | ICD-10-CM | POA: Diagnosis not present

## 2020-12-10 DIAGNOSIS — I2510 Atherosclerotic heart disease of native coronary artery without angina pectoris: Secondary | ICD-10-CM | POA: Diagnosis present

## 2020-12-10 DIAGNOSIS — M159 Polyosteoarthritis, unspecified: Secondary | ICD-10-CM | POA: Diagnosis present

## 2020-12-10 DIAGNOSIS — Z20822 Contact with and (suspected) exposure to covid-19: Secondary | ICD-10-CM | POA: Diagnosis present

## 2020-12-10 DIAGNOSIS — Z825 Family history of asthma and other chronic lower respiratory diseases: Secondary | ICD-10-CM | POA: Diagnosis not present

## 2020-12-10 DIAGNOSIS — I10 Essential (primary) hypertension: Secondary | ICD-10-CM | POA: Diagnosis present

## 2020-12-10 DIAGNOSIS — Z7951 Long term (current) use of inhaled steroids: Secondary | ICD-10-CM | POA: Diagnosis not present

## 2020-12-10 DIAGNOSIS — R0902 Hypoxemia: Secondary | ICD-10-CM | POA: Diagnosis present

## 2020-12-10 DIAGNOSIS — Z9001 Acquired absence of eye: Secondary | ICD-10-CM | POA: Diagnosis not present

## 2020-12-10 DIAGNOSIS — Z8616 Personal history of COVID-19: Secondary | ICD-10-CM | POA: Diagnosis not present

## 2020-12-10 DIAGNOSIS — J441 Chronic obstructive pulmonary disease with (acute) exacerbation: Secondary | ICD-10-CM | POA: Diagnosis present

## 2020-12-10 DIAGNOSIS — F1010 Alcohol abuse, uncomplicated: Secondary | ICD-10-CM | POA: Diagnosis present

## 2020-12-10 LAB — CBC
HCT: 49.1 % (ref 39.0–52.0)
Hemoglobin: 16 g/dL (ref 13.0–17.0)
MCH: 27.1 pg (ref 26.0–34.0)
MCHC: 32.6 g/dL (ref 30.0–36.0)
MCV: 83.1 fL (ref 80.0–100.0)
Platelets: 321 10*3/uL (ref 150–400)
RBC: 5.91 MIL/uL — ABNORMAL HIGH (ref 4.22–5.81)
RDW: 14 % (ref 11.5–15.5)
WBC: 12.4 10*3/uL — ABNORMAL HIGH (ref 4.0–10.5)
nRBC: 0 % (ref 0.0–0.2)

## 2020-12-10 LAB — ECHOCARDIOGRAM COMPLETE
AR max vel: 3.14 cm2
AV Area VTI: 4.5 cm2
AV Area mean vel: 3.62 cm2
AV Mean grad: 1 mmHg
AV Peak grad: 2.9 mmHg
Ao pk vel: 0.85 m/s
Area-P 1/2: 2.99 cm2
Height: 68 in
MV VTI: 2.91 cm2
S' Lateral: 2.8 cm
Weight: 1967.98 oz

## 2020-12-10 LAB — COMPREHENSIVE METABOLIC PANEL
ALT: 21 U/L (ref 0–44)
AST: 36 U/L (ref 15–41)
Albumin: 3.7 g/dL (ref 3.5–5.0)
Alkaline Phosphatase: 93 U/L (ref 38–126)
Anion gap: 11 (ref 5–15)
BUN: 18 mg/dL (ref 8–23)
CO2: 26 mmol/L (ref 22–32)
Calcium: 9.6 mg/dL (ref 8.9–10.3)
Chloride: 97 mmol/L — ABNORMAL LOW (ref 98–111)
Creatinine, Ser: 1.03 mg/dL (ref 0.61–1.24)
GFR, Estimated: >60 mL/min (ref >60)
Glucose, Bld: 166 mg/dL — ABNORMAL HIGH (ref 70–99)
Potassium: 3.7 mmol/L (ref 3.5–5.1)
Sodium: 134 mmol/L — ABNORMAL LOW (ref 135–145)
Total Bilirubin: 1.2 mg/dL (ref 0.3–1.2)
Total Protein: 7.2 g/dL (ref 6.5–8.1)

## 2020-12-10 MED ORDER — METOPROLOL TARTRATE 25 MG PO TABS
25.0000 mg | ORAL_TABLET | Freq: Two times a day (BID) | ORAL | Status: DC
  Administered 2020-12-10 – 2020-12-12 (×4): 25 mg via ORAL
  Filled 2020-12-10 (×4): qty 1

## 2020-12-10 MED ORDER — FLUTICASONE FUROATE-VILANTEROL 200-25 MCG/INH IN AEPB
1.0000 | INHALATION_SPRAY | Freq: Every day | RESPIRATORY_TRACT | Status: DC
  Administered 2020-12-12: 1 via RESPIRATORY_TRACT
  Filled 2020-12-10 (×2): qty 28

## 2020-12-10 MED ORDER — ALBUTEROL SULFATE (2.5 MG/3ML) 0.083% IN NEBU: 2.5000 mg | INHALATION_SOLUTION | RESPIRATORY_TRACT | Status: DC | PRN

## 2020-12-10 MED ORDER — IPRATROPIUM-ALBUTEROL 0.5-2.5 (3) MG/3ML IN SOLN: 3.0000 mL | Freq: Four times a day (QID) | RESPIRATORY_TRACT | Status: DC | PRN

## 2020-12-10 MED ORDER — FLUTICASONE PROPIONATE 50 MCG/ACT NA SUSP
2.0000 | Freq: Every day | NASAL | Status: DC
  Administered 2020-12-10 – 2020-12-12 (×3): 2 via NASAL
  Filled 2020-12-10: qty 16

## 2020-12-10 MED ORDER — TRAMADOL HCL 50 MG PO TABS: 50.0000 mg | ORAL_TABLET | Freq: Four times a day (QID) | ORAL | Status: DC | PRN

## 2020-12-10 MED ORDER — MOMETASONE FURO-FORMOTEROL FUM 200-5 MCG/ACT IN AERO: 2.0000 | INHALATION_SPRAY | Freq: Two times a day (BID) | RESPIRATORY_TRACT | Status: DC

## 2020-12-10 MED ORDER — HYDROXYZINE HCL 10 MG PO TABS: 10.0000 mg | ORAL_TABLET | Freq: Three times a day (TID) | ORAL | Status: DC | PRN

## 2020-12-10 MED ORDER — METOPROLOL TARTRATE 5 MG/5ML IV SOLN: 5.0000 mg | INTRAVENOUS | Status: DC | PRN

## 2020-12-10 MED ORDER — IPRATROPIUM-ALBUTEROL 0.5-2.5 (3) MG/3ML IN SOLN: 3.0000 mL | Freq: Four times a day (QID) | RESPIRATORY_TRACT | Status: DC

## 2020-12-10 MED ORDER — IPRATROPIUM-ALBUTEROL 0.5-2.5 (3) MG/3ML IN SOLN
3.0000 mL | Freq: Four times a day (QID) | RESPIRATORY_TRACT | Status: DC
  Administered 2020-12-10 – 2020-12-11 (×4): 3 mL via RESPIRATORY_TRACT
  Filled 2020-12-10 (×3): qty 3

## 2020-12-10 MED ORDER — APIXABAN 2.5 MG PO TABS
2.5000 mg | ORAL_TABLET | Freq: Two times a day (BID) | ORAL | Status: DC
  Administered 2020-12-10 – 2020-12-11 (×3): 2.5 mg via ORAL
  Filled 2020-12-10 (×3): qty 1

## 2020-12-10 MED ORDER — TIZANIDINE HCL 4 MG PO TABS
4.0000 mg | ORAL_TABLET | Freq: Four times a day (QID) | ORAL | Status: DC | PRN
  Filled 2020-12-10: qty 1

## 2020-12-10 MED ORDER — METHYLPREDNISOLONE SODIUM SUCC 40 MG IJ SOLR
40.0000 mg | Freq: Two times a day (BID) | INTRAMUSCULAR | Status: DC
  Administered 2020-12-10 – 2020-12-12 (×5): 40 mg via INTRAVENOUS
  Filled 2020-12-10 (×5): qty 1

## 2020-12-10 MED ORDER — UMECLIDINIUM BROMIDE 62.5 MCG/INH IN AEPB
1.0000 | INHALATION_SPRAY | Freq: Every day | RESPIRATORY_TRACT | Status: DC
  Filled 2020-12-10: qty 7

## 2020-12-10 NOTE — Progress Notes (Incomplete)
  Echocardiogram 2D Echocardiogram has been attempted. Patient with PT. Will reattempt at later time.  Randa Lynn Mateya Torti 12/10/2020, 12:25 PM

## 2020-12-10 NOTE — Progress Notes (Addendum)
PROGRESS NOTE    Zachary Chambers  SEG:[id number] DOB: 06/04/1951 DOA: 12/09/2020 PCP: Zachary Rossetti, MD    Brief Narrative:  Zachary Chambers was admitted to the hospital with the working diagnosis of acute on chronic hypoxemic respiratory failure due to COPD exacerbation.   70 year old male past medical history for anxiety, coronary artery disease, COPD, dyslipidemia, GERD, history of CVA, alcohol abuse, atrial fibrillation and COVID-19 infection 06/2020, who presented with dyspnea.   He reported 2 days of dyspnea, slowly progressing and worsening, associated with palpitations and  increased sputum production.  His symptoms were refractory to increased flow of his home oxygen.  On his initial physical examination his systolic blood pressure was 200, repeat 141/76, heart rate 99, respiratory rate 15, oxygen saturation 96% on supplemental oxygen.  His lungs have bilateral rhonchi, no wheezing, heart S1-S2, present, regular, abdomen soft, no lower extremity edema.  Sodium 134, potassium 3.9, chloride 95, bicarb 25, glucose 112, BUN 16, creatinine 1.0, high sensitive troponin 333-272, arterial blood gas pH 7.39, PCO2 48.5, PO2 88, bicarb 31, oxygen saturation 97%.  White count 18.3, hemoglobin 16.4, hematocrit 52.3, platelets 325. SARS COVID-19 negative.  Chest radiograph with increased interstitial vascular infiltrates at bases bilaterally, homogeneous. CT chest with emphysematous changes, positive fluid in left fissure, bilateral atelectasis at bases.  No pulmonary embolism.  EKG 126 bpm, normal axis, normal intervals, sinus rhythm, no significant ST segment or T wave changes, noisy baseline.  Assessment & Plan:   Principal Problem:   COPD exacerbation (Zachary Chambers) Active Problems:   CAD (coronary artery disease)   GERD (gastroesophageal reflux disease)   Heavy alcohol use   Dyslipidemia   S/P coronary artery stent placement   Transient atrial fibrillation (HCC)   Anxiety   History of CVA with  residual deficit   Acute on chronic respiratory failure with hypoxia (Zachary Chambers)   Demand ischemia (Zachary Chambers)   1. Acute on chronic hypoxemic respiratory failure due to COPD exacerbation.  Oxymetry is 97% on 4 L/min per Zachary Chambers, continue to have dyspnea, worse than his baseline. At home uses walker for ambulation and 2.5 L/min 02 per Zachary Chambers.   Continue medical therapy with systemic corticosteroids and aggressive bronchodilator therapy. Supplemental 02 per Zachary Chambers to target 02 saturation 88% or greater, and inhaled corticosteroids.  Azithromycin for 5 doses total.   Out of bed to chair tid with meals, PT/OT and airway clearing techniques with flutter valve and incentive spirometer.  Nutrition consultation.   2. Paroxysmal atrial fibrillation.  Old records personally reviewed with echocardiogram from 02/2020 with preserved LV systolic function 55 to 73%, with preserved RV systolic pressure.   Continue rate control with  Anticoagulation with apixaban.   Elevated troponin due to demand ischemia, will follow up on repeat echocardiogram, patient with no chest pain. Ruled out acute coronary syndrome.   3. HTN Uncontrolled, dyslipidemia. Blood pressure this am is   4. Hx CVA. Continue statin therapy and aspirin.   5. Alcohol abuse, anxiety. No clinical signs of withdrawal, continue with as needed lorazepam.  Resume hydroxyzine, tizanidine, and tramadol.   6. Reactive leukocytosis. Wbc trending dow, no signs of bacterial infection, check cell count in 48 hrs.    Patient continue to be at high risk for worsening respiratory failure   Status is: Observation  The patient will require care spanning > 2 midnights and should be moved to inpatient because: IV treatments appropriate due to intensity of illness or inability to take PO  Dispo: The patient is  from: Home              Anticipated d/c is to: Home              Patient currently is not medically stable to d/c.   Difficult to place patient No  DVT  prophylaxis: apixaban  Code Status:   full  Family Communication:  No family at the bedside     Subjective: Patient continue to have dyspnea and generalized weakness not yet back to his baseline. Continue to have cough and congestion, no chest pain or lower extremity edema,   Objective: Vitals:   12/10/20 0411 12/10/20 0732 12/10/20 0745 12/10/20 0900  BP: (!) 159/87 128/74    Pulse: 96 87    Resp: (!) 23 16    Temp: 98.1 F (36.7 C)     TempSrc: Oral     SpO2: 95% 98% 98% 97%  Weight:      Height:       No intake or output data in the 24 hours ending 12/10/20 1057 Filed Weights   12/09/20 1758  Weight: 55.8 kg    Examination:   General: Not in pain or dyspnea, deconditioned  Neurology: Awake and alert, non focal  E ENT: mild pallor, no icterus, oral mucosa moist Cardiovascular: No JVD. S1-S2 present, rhythmic, no gallops, rubs, or murmurs. No lower extremity edema. Pulmonary: positive breath sounds bilaterally, decreased air movement, no wheezing, scattered rhonchi and rales. Gastrointestinal. Abdomen soft and non tender Skin. No rashes Musculoskeletal: no joint deformities     Data Reviewed: I have personally reviewed following labs and imaging studies  CBC: Recent Labs  Lab 12/09/20 1729 12/09/20 1848 12/10/20 0417  WBC 18.3*  --  12.4*  NEUTROABS 15.9*  --   --   HGB 16.4 16.7 16.0  HCT 52.3* 49.0 49.1  MCV 85.9  --  83.1  PLT 325  --  875   Basic Metabolic Panel: Recent Labs  Lab 12/09/20 1729 12/09/20 1848 12/10/20 0417  NA 134* 136 134*  K 3.9 3.8 3.7  CL 95*  --  97*  CO2 25  --  26  GLUCOSE 112*  --  166*  BUN 16  --  18  CREATININE 1.02  --  1.03  CALCIUM 9.5  --  9.6   GFR: Estimated Creatinine Clearance: 51.9 mL/min (by C-G formula based on SCr of 1.03 mg/dL). Liver Function Tests: Recent Labs  Lab 12/10/20 0417  AST 36  ALT 21  ALKPHOS 93  BILITOT 1.2  PROT 7.2  ALBUMIN 3.7   No results for input(s): LIPASE, AMYLASE in  the last 168 hours. No results for input(s): AMMONIA in the last 168 hours. Coagulation Profile: No results for input(s): INR, PROTIME in the last 168 hours. Cardiac Enzymes: No results for input(s): CKTOTAL, CKMB, CKMBINDEX, TROPONINI in the last 168 hours. BNP (last 3 results) No results for input(s): PROBNP in the last 8760 hours. HbA1C: No results for input(s): HGBA1C in the last 72 hours. CBG: No results for input(s): GLUCAP in the last 168 hours. Lipid Profile: No results for input(s): CHOL, HDL, LDLCALC, TRIG, CHOLHDL, LDLDIRECT in the last 72 hours. Thyroid Function Tests: No results for input(s): TSH, T4TOTAL, FREET4, T3FREE, THYROIDAB in the last 72 hours. Anemia Panel: No results for input(s): VITAMINB12, FOLATE, FERRITIN, TIBC, IRON, RETICCTPCT in the last 72 hours.    Radiology Studies: I have reviewed all of the imaging during this hospital visit personally  Scheduled Meds: . apixaban  2.5 mg Oral BID  . aspirin EC  81 mg Oral QHS  . atorvastatin  40 mg Oral QHS  . azithromycin  250 mg Oral Daily  . fluticasone furoate-vilanterol  1 puff Inhalation Daily  . pantoprazole  40 mg Oral Daily  . predniSONE  40 mg Oral Q breakfast  . sodium chloride flush  3 mL Intravenous Q12H  . umeclidinium bromide  1 puff Inhalation Daily   Continuous Infusions:   LOS: 0 days        Talynn Lebon Gerome Apley, MD

## 2020-12-10 NOTE — TOC Initial Note (Signed)
Transition of Care [facility]) - Initial/Assessment Note    Patient Details  Name: Zachary Chambers MRN: [id number] Date of Birth: [birth year]  Transition of Care [facility]) CM/SW Contact:    Joanne Chars, LCSW Phone Number: 12/10/2020, 4:04 PM  Clinical Narrative:  CSW met with pt regarding discharge plan.  Pt reports he is not very interested in SNF placement for several reasons, would be more open to [facility].  Permission given for CSW to speak with Kirkland Hun and son Leroy Sea.  Choice document given.  Pt does have home O2 services from [place].  Current equipment in home: walker, wheelchair.  PCP in place.    CSW attempted to contact fiancee [name], LM. CSW was able to contact son Leroy Sea who reports that Lelon Frohlich is an Therapist, sports who has been caring for pt currently and things have been working pretty well.  Leroy Sea would support HH if pt does not want to go to SNF.                 Expected Discharge Plan: [place] Barriers to Discharge: Continued Medical Work up   Patient Goals and CMS Choice Patient states their goals for this hospitalization and ongoing recovery are:: "be able to mow the grass, take care of my property" CMS Medicare.gov Compare Post Acute Care list provided to:: Patient Choice offered to / list presented to : Patient  Expected Discharge Plan and Services Expected Discharge Plan: [place] Choice: [place] arrangements for the past 2 months: Single Family Home                                      Prior Living Arrangements/Services Living arrangements for the past 2 months: Single Family Home Lives with:: Significant Other Patient language and need for interpreter reviewed:: Yes Do you feel safe going back to the place where you live?: Yes      Need for Family Participation in Patient Care: Yes (Comment) Care giver support system in place?: Yes (comment) Current home services: Other (comment) (none) Criminal  Activity/Legal Involvement Pertinent to Current Situation/Hospitalization: No - Comment as needed  Activities of Daily Living      Permission Sought/Granted Permission sought to share information with : Family Supports Permission granted to share information with : Yes, Verbal Permission Granted  Share Information with NAME: Kirkland Hun, son Leroy Sea  Permission granted to share info w AGENCY: HH        Emotional Assessment Appearance:: Appears stated age Attitude/Demeanor/Rapport: Engaged Affect (typically observed): Appropriate,Pleasant Orientation: : Oriented to Self,Oriented to Place,Oriented to  Time,Oriented to Situation Alcohol / Substance Use: Alcohol Use Psych Involvement: No (comment)  Admission diagnosis:  COPD exacerbation ([place]) [J44.1] NSTEMI (non-ST elevated myocardial infarction) ([place]) [I21.4] Hypoxemia [R09.02] Patient Active Problem List   Diagnosis Date Noted  . Hypoxemia 12/10/2020  . COPD exacerbation ([place]) 12/09/2020  . Acute on chronic respiratory failure with hypoxia ([place]) 12/09/2020  . Demand ischemia ([place]) 12/09/2020  . Anxiety 10/07/2020  . History of CVA with residual deficit 10/07/2020  . Pneumonia due to COVID-19 virus 07/05/2020  . Chronic respiratory failure ([place]) 07/05/2020  . Transient atrial fibrillation ([place]) 07/05/2020  . Dyspnea 07/04/2020  . Protein-calorie malnutrition, severe 03/07/2020  . S/P coronary artery stent placement   . Chest pain 03/06/2020  . Dyslipidemia 03/06/2020  . Healthcare  maintenance 03/05/2020  . Heavy alcohol use 03/04/2018  . Memory changes 03/04/2018  . Leukopenia 05/04/2017  . Bruit 01/22/2014  . Muscle spasm of back 09/20/2013  . COPD (chronic obstructive pulmonary disease) ([place]) 01/14/2011  . CAD (coronary artery disease) 01/14/2011  . GERD (gastroesophageal reflux disease) 01/14/2011  . TOBACCO ABUSE 07/25/2009  . Osteoarthritis 07/25/2009  . ROTATOR CUFF SYNDROME, RIGHT 07/25/2009   PCP:  [name], MD Pharmacy:   [place], [place] - [address] [place] [address] Phone: [phone] Fax: [phone]  [facility] - [place], [place] [address] Phone: [phone] Fax: [phone]     Social Determinants of Health (SDOH) Interventions    Readmission Risk Interventions No flowsheet data found.

## 2020-12-10 NOTE — Evaluation (Signed)
Physical Therapy Evaluation Patient Details Name: Zachary Chambers MRN: [id number] DOB: 23-Sep-1950 Today's Date: 12/10/2020   History of Present Illness  70 yo admitted 4/25 with SOB and COPD exacerbation with demand ischemia. PMhx; CAD, COPD on 2.5L at home, HLD, GERD, anxiety, ETOH use, CVA, Afib  Clinical Impression  Pt pleasant and reports living with fiancee and being able to care for himself and move around without AD at home. Pt then also stating he has a fear of falling and water on his head and has required significant assist for ADLs and iADLs from fiancee. Pt on 5L with transition to EOB sats dropping from 94% to 87%, transition to 6L for mobility with sats 90-94% with transition bed<>chair. Pt unsteady, anxious, and SOB with limited mobility with pt unable to ambulate at this time. Pt with decreased strength, function, balance and cardiopulmonary function who will benefit from acute therapy to maximize mobility, safety and independence.     Follow Up Recommendations SNF;Supervision for mobility/OOB    Equipment Recommendations  None recommended by PT    Recommendations for Other Services OT consult     Precautions / Restrictions Precautions Precautions: Fall Precaution Comments: watch sats      Mobility  Bed Mobility Overal bed mobility: Needs Assistance Bed Mobility: Supine to Sit;Sit to Supine     Supine to sit: Min guard;HOB elevated Sit to supine: Min guard;HOB elevated   General bed mobility comments: HOB 25 degrees with use of rail    Transfers Overall transfer level: Needs assistance   Transfers: Sit to/from [employer] Transfers Sit to Stand: Min guard Stand pivot transfers: Min assist       General transfer comment: guarding to stand with pt reliant on UE support to stand. Unsteady on his feet with assist to pivot from bed<>chair with maintained bil UE contact on surfaces throughout and pt SOB with minimal activity.  Ambulation/Gait              General Gait Details: unable  Stairs            Wheelchair Mobility    Modified Rankin (Stroke Patients Only)       Balance Overall balance assessment: Needs assistance Sitting-balance support: No upper extremity supported;Feet supported Sitting balance-Leahy Scale: Fair Sitting balance - Comments: EOB able to sit and use urinal without UE support   Standing balance support: Bilateral upper extremity supported Standing balance-Leahy Scale: Poor Standing balance comment: with all standing pt required bil UE support                             Pertinent Vitals/Pain Pain Assessment: No/denies pain    Home Living Family/patient expects to be discharged to:: Private residence Living Arrangements: Spouse/significant other Available Help at Discharge: Family;Available PRN/intermittently Type of Home: House Home Access: Ramped entrance     Home Layout: One level Home Equipment: Clinical cytogeneticist - 2 wheels;Wheelchair - manual;Bedside commode Additional Comments: pt reports fiancee is a Administrator and not always there. House is not WC accessible    Prior Function Level of Independence: Needs assistance   Gait / Transfers Assistance Needed: walks without assist  ADL's / Homemaking Assistance Needed: fiancee is doing the homemaking and providing sponge bathes (pt is afraid of showers and water on his head), uses briefs or urinal for toileting (afraid of using BSC)  Comments: pt stating he can walk in the house and care for himself but in  other statements sounds like he is sedentary and unable to walk household distance with difficulty getting to the bathroom     Hand Dominance        Extremity/Trunk Assessment   Upper Extremity Assessment Upper Extremity Assessment: Generalized weakness    Lower Extremity Assessment Lower Extremity Assessment: Generalized weakness    Cervical / Trunk Assessment Cervical / Trunk Assessment: Kyphotic   Communication   Communication: No difficulties  Cognition Arousal/Alertness: Awake/alert Behavior During Therapy: WFL for tasks assessed/performed Overall Cognitive Status: Impaired/Different from baseline Area of Impairment: Memory;Safety/judgement                     Memory: Decreased short-term memory   Safety/Judgement: Decreased awareness of deficits     General Comments: Pt at times stating anxiety and fear over mobility with increased time to move based on anxiety. pt with difficulty with recalling home situation at times and some word finding deficits      General Comments      Exercises     Assessment/Plan    PT Assessment Patient needs continued PT services  PT Problem List Decreased strength;Decreased mobility;Decreased safety awareness;Decreased activity tolerance;Decreased balance;Decreased knowledge of use of DME;Decreased cognition;Cardiopulmonary status limiting activity       PT Treatment Interventions Gait training;Balance training;Functional mobility training;Therapeutic activities;Patient/family education;Cognitive remediation;Neuromuscular re-education;DME instruction;Therapeutic exercise    PT Goals (Current goals can be found in the Care Plan section)  Acute Rehab PT Goals Patient Stated Goal: be able to return home PT Goal Formulation: With patient Time For Goal Achievement: 12/24/20 Potential to Achieve Goals: Fair    Frequency Min 3X/week   Barriers to discharge Decreased caregiver support      Co-evaluation               AM-PAC PT "6 Clicks" Mobility  Outcome Measure Help needed turning from your back to your side while in a flat bed without using bedrails?: A Little Help needed moving from lying on your back to sitting on the side of a flat bed without using bedrails?: A Little Help needed moving to and from a bed to a chair (including a wheelchair)?: A Little Help needed standing up from a chair using your arms (e.g.,  wheelchair or bedside chair)?: A Little Help needed to walk in hospital room?: A Lot Help needed climbing 3-5 steps with a railing? : Total 6 Click Score: 15    End of Session   Activity Tolerance: Patient limited by fatigue Patient left: in bed;with call bell/phone within reach;with bed alarm set Nurse Communication: Mobility status PT Visit Diagnosis: Other abnormalities of gait and mobility (R26.89);Difficulty in walking, not elsewhere classified (R26.2);Muscle weakness (generalized) (M62.81);Unsteadiness on feet (R26.81)    Time: 8242-3536 PT Time Calculation (min) (ACUTE ONLY): 31 min   Charges:   PT Evaluation $PT Eval Moderate Complexity: 1 Mod PT Treatments $Therapeutic Activity: 8-22 mins        Javarian Jakubiak P, PT Acute Rehabilitation Services Pager: [phone] Office: [phone]   Trenell Moxey B Sharica Roedel 12/10/2020, 1:26 PM

## 2020-12-10 NOTE — Plan of Care (Signed)
  Problem: Coping: Goal: Level of anxiety will decrease Outcome: Not Progressing  Pt slightly confused. Calling for fiance Problem: Safety: Goal: Ability to remain free from injury will improve Outcome: Progressing

## 2020-12-10 NOTE — Progress Notes (Signed)
  Echocardiogram 2D Echocardiogram has been performed.  Zachary Chambers 12/10/2020, 6:18 PM

## 2020-12-11 ENCOUNTER — Ambulatory Visit: Payer: Medicare HMO | Admitting: Emergency Medicine

## 2020-12-11 DIAGNOSIS — J441 Chronic obstructive pulmonary disease with (acute) exacerbation: Principal | ICD-10-CM

## 2020-12-11 LAB — BASIC METABOLIC PANEL
Anion gap: 12 (ref 5–15)
BUN: 22 mg/dL (ref 8–23)
CO2: 28 mmol/L (ref 22–32)
Calcium: 9.4 mg/dL (ref 8.9–10.3)
Chloride: 94 mmol/L — ABNORMAL LOW (ref 98–111)
Creatinine, Ser: 1 mg/dL (ref 0.61–1.24)
GFR, Estimated: >60 mL/min (ref >60)
Glucose, Bld: 126 mg/dL — ABNORMAL HIGH (ref 70–99)
Potassium: 3.9 mmol/L (ref 3.5–5.1)
Sodium: 134 mmol/L — ABNORMAL LOW (ref 135–145)

## 2020-12-11 MED ORDER — ADULT MULTIVITAMIN W/MINERALS CH
1.0000 | ORAL_TABLET | Freq: Every day | ORAL | Status: DC
  Administered 2020-12-11 – 2020-12-12 (×2): 1 via ORAL
  Filled 2020-12-11 (×2): qty 1

## 2020-12-11 MED ORDER — APIXABAN 5 MG PO TABS
5.0000 mg | ORAL_TABLET | Freq: Two times a day (BID) | ORAL | Status: DC
  Administered 2020-12-11 – 2020-12-12 (×2): 5 mg via ORAL
  Filled 2020-12-11 (×2): qty 1

## 2020-12-11 MED ORDER — BOOST / RESOURCE BREEZE PO LIQD CUSTOM
1.0000 | Freq: Three times a day (TID) | ORAL | Status: DC
  Administered 2020-12-11: 1 via ORAL

## 2020-12-11 NOTE — Evaluation (Signed)
Occupational Therapy Evaluation Patient Details Name: Zachary Chambers MRN: [id number] DOB: 06/19/1951 Today's Date: 12/11/2020    History of Present Illness 70 yo admitted 4/25 with SOB and COPD exacerbation with demand ischemia. PMhx; CAD, COPD on 2.5L at home, HLD, GERD, anxiety, ETOH use, CVA, Afib   Clinical Impression   Pt PTA: Pt independent and spouse is on the road a lot. Pt currently, appears to take increased time for tasks and cognitively following most commands, but at times, not always aware of safety. Pt limited by decreased cardiopulmonary exertion, decreased strength and decreased ability to care for self. 3 LPM O2 >95%  and pt turned down from 5L O2 to 3L and tolerating well. Pt minA overall for ADL and mobility with minguardA and use of RW. Pt would greatly benefit from continued OT skilled services. OT following acutely.     Follow Up Recommendations  SNF;Supervision - Intermittent    Equipment Recommendations  3 in 1 bedside commode    Recommendations for Other Services       Precautions / Restrictions Precautions Precautions: Fall Precaution Comments: watch sats Restrictions Weight Bearing Restrictions: No      Mobility Bed Mobility Overal bed mobility: Needs Assistance Bed Mobility: Supine to Sit     Supine to sit: Min guard;HOB elevated     General bed mobility comments: no physical assist    Transfers Overall transfer level: Needs assistance Equipment used: Rolling walker (2 wheeled) Transfers: Sit to/from Stand Sit to Stand: Min guard         General transfer comment: Cues for hand placement and controlled descent    Balance Overall balance assessment: Needs assistance Sitting-balance support: No upper extremity supported;Feet supported Sitting balance-Leahy Scale: Good     Standing balance support: Bilateral upper extremity supported Standing balance-Leahy Scale: Poor Standing balance comment: RW for stability                            ADL either performed or assessed with clinical judgement   ADL Overall ADL's : Needs assistance/impaired Eating/Feeding: Set up;Sitting   Grooming: Minimal assistance;Standing Grooming Details (indicate cue type and reason): requires assist to open containers and remind him that there is water on the sink instead of using his hand for water. Upper Body Bathing: Minimal assistance;Standing   Lower Body Bathing: Moderate assistance;Sit to/from stand   Upper Body Dressing : Minimal assistance;Standing   Lower Body Dressing: Moderate assistance;Sit to/from stand   Toilet Transfer: Minimal assistance;Ambulation;BSC;RW;Cueing for safety;Cueing for sequencing   Toileting- Clothing Manipulation and Hygiene: Moderate assistance;Cueing for safety;Sitting/lateral lean;Sit to/from stand       Functional mobility during ADLs: Minimal assistance;Rolling walker;Cueing for safety;Cueing for sequencing General ADL Comments: Pt appears to take increased time for tasks and cognitively following most commands, but at times, not always aware of safety. Pt limited by decreased cardiopulmonary exertion, decreased strength and decreased ability to care for self.     Vision Baseline Vision/History: Wears glasses Patient Visual Report: No change from baseline Vision Assessment?: No apparent visual deficits     Perception     Praxis      Pertinent Vitals/Pain Pain Assessment: No/denies pain     Hand Dominance Right   Extremity/Trunk Assessment Upper Extremity Assessment Upper Extremity Assessment: Generalized weakness   Lower Extremity Assessment Lower Extremity Assessment: Generalized weakness   Cervical / Trunk Assessment Cervical / Trunk Assessment: Kyphotic   Communication Communication Communication: No difficulties  Cognition Arousal/Alertness: Awake/alert Behavior During Therapy: WFL for tasks assessed/performed Overall Cognitive Status: Impaired/Different from  baseline Area of Impairment: Memory;Safety/judgement                     Memory: Decreased short-term memory   Safety/Judgement: Decreased awareness of deficits;Decreased awareness of safety     General Comments: At times pt appears speaking tangentially, but other times was making full sense. Pt did not show anxiety today, but did have word finding difficulties.   General Comments  3 LPM O2 >95%  and pt turned down from 5L O2 to 3L and tolerating well.    Exercises Other Exercises Other Exercises: Pt declined exercises after ambulation   Shoulder Instructions      Home Living Family/patient expects to be discharged to:: Private residence Living Arrangements: Spouse/significant other Available Help at Discharge: Family;Available PRN/intermittently Type of Home: House Home Access: Ramped entrance     Home Layout: One level     Bathroom Shower/Tub: Walk-in shower;Tub/shower unit   Bathroom Toilet: Standard Bathroom Accessibility: No   Home Equipment: Clinical cytogeneticist - 2 wheels;Wheelchair - manual;Bedside commode   Additional Comments: pt reports fiancee is a Administrator and not always there. House is not WC accessible      Prior Functioning/Environment Level of Independence: Needs assistance  Gait / Transfers Assistance Needed: walks without assist ADL's / Homemaking Assistance Needed: fiancee is doing the homemaking and providing sponge bathes (pt is afraid of showers and water on his head), uses briefs or urinal for toileting (afraid of using BSC)            OT Problem List: Decreased activity tolerance;Decreased cognition;Impaired vision/perception;Decreased safety awareness;Pain;Cardiopulmonary status limiting activity;Impaired balance (sitting and/or standing)      OT Treatment/Interventions: Therapeutic exercise;Self-care/ADL training;Energy conservation;DME and/or AE instruction;Therapeutic activities;Patient/family education;Balance training     OT Goals(Current goals can be found in the care plan section) Acute Rehab OT Goals Patient Stated Goal: be able to return home OT Goal Formulation: With patient Time For Goal Achievement: 12/25/20 Potential to Achieve Goals: Good ADL Goals Pt Will Transfer to Toilet: with supervision;ambulating;bedside commode Pt/caregiver will Perform Home Exercise Program: Increased strength;Both right and left upper extremity;With minimal assist;With written HEP provided Additional ADL Goal #1: Pt will state 3 energy conservation techniques in order to increase activity tolerance. Additional ADL Goal #2: Pt will follow mutli-step commands with miminal cues to problem solve through task.  OT Frequency: Min 2X/week   Barriers to D/C:            Co-evaluation              AM-PAC OT "6 Clicks" Daily Activity     Outcome Measure Help from another person eating meals?: None Help from another person taking care of personal grooming?: A Little Help from another person toileting, which includes using toliet, bedpan, or urinal?: A Little Help from another person bathing (including washing, rinsing, drying)?: A Little Help from another person to put on and taking off regular upper body clothing?: None Help from another person to put on and taking off regular lower body clothing?: A Little 6 Click Score: 20   End of Session Equipment Utilized During Treatment: Gait belt;Rolling walker Nurse Communication: Mobility status  Activity Tolerance: Patient tolerated treatment well Patient left: in chair;with call bell/phone within reach;with chair alarm set  OT Visit Diagnosis: Unsteadiness on feet (R26.81);Muscle weakness (generalized) (M62.81);Cognitive communication deficit (R41.841) Symptoms and signs involving cognitive functions: Other cerebrovascular  disease                Time: 2703-5009 OT Time Calculation (min): 30 min Charges:  OT General Charges $OT Visit: 1 Visit OT Evaluation $OT Eval  Moderate Complexity: 1 Mod OT Treatments $Self Care/Home Management : 8-22 mins  Jefferey Pica, OTR/L Acute Rehabilitation Services Pager: [phone] Office: [phone]  Vineeth Fell C 12/11/2020, 5:34 PM

## 2020-12-11 NOTE — Progress Notes (Signed)
Physical Therapy Treatment Patient Details Name: Zachary Chambers MRN: [id number] DOB: 1951-06-17 Today's Date: 12/11/2020    History of Present Illness 70 yo admitted 4/25 with SOB and COPD exacerbation with demand ischemia. PMhx; CAD, COPD on 2.5L at home, HLD, GERD, anxiety, ETOH use, CVA, Afib    PT Comments    Pt making gradual progress.  Continues to fatigue easily and is unsteady with mobility requiring min guard to min A.  Pt is at home alone frequently and is fall risk- continue to recommend SNF.    Follow Up Recommendations  SNF;Supervision for mobility/OOB     Equipment Recommendations  None recommended by PT    Recommendations for Other Services       Precautions / Restrictions Precautions Precautions: Fall Precaution Comments: watch sats    Mobility  Bed Mobility               General bed mobility comments: in chair at arrival    Transfers Overall transfer level: Needs assistance Equipment used: Rolling walker (2 wheeled) Transfers: Sit to/from Stand Sit to Stand: Min guard         General transfer comment: Cues for hand placement and controlled descent  Ambulation/Gait Ambulation/Gait assistance: Min guard Gait Distance (Feet): 60 Feet Assistive device: Rolling walker (2 wheeled) Gait Pattern/deviations: Step-through pattern;Decreased stride length;Shuffle Gait velocity: decreased   General Gait Details: cues for RW proximity particularly with turns   [employer] Rankin (Stroke Patients Only)       Balance Overall balance assessment: Needs assistance Sitting-balance support: No upper extremity supported;Feet supported Sitting balance-Leahy Scale: Good     Standing balance support: Bilateral upper extremity supported Standing balance-Leahy Scale: Poor Standing balance comment: requiring RW - stable with RW                            Cognition Arousal/Alertness:  Awake/alert Behavior During Therapy: WFL for tasks assessed/performed Overall Cognitive Status: Impaired/Different from baseline Area of Impairment: Memory;Safety/judgement                     Memory: Decreased short-term memory   Safety/Judgement: Decreased awareness of deficits;Decreased awareness of safety            Exercises Other Exercises Other Exercises: Pt declined exercises after ambulation    General Comments General comments (skin integrity, edema, etc.): Pt on 3 LPM O2 with sats 95% rest 93% activity but with DOE of 3/4 requiring 3 mins to recover.      Pertinent Vitals/Pain Pain Assessment: No/denies pain    Home Living                      Prior Function            PT Goals (current goals can now be found in the care plan section) Acute Rehab PT Goals Patient Stated Goal: be able to return home PT Goal Formulation: With patient Time For Goal Achievement: 12/24/20 Potential to Achieve Goals: Fair Progress towards PT goals: Progressing toward goals    Frequency    Min 3X/week      PT Plan Current plan remains appropriate    Co-evaluation              AM-PAC PT "6 Clicks" Mobility   Outcome Measure  Help needed turning from  your back to your side while in a flat bed without using bedrails?: A Little Help needed moving from lying on your back to sitting on the side of a flat bed without using bedrails?: A Little Help needed moving to and from a bed to a chair (including a wheelchair)?: A Little Help needed standing up from a chair using your arms (e.g., wheelchair or bedside chair)?: A Little Help needed to walk in hospital room?: A Little Help needed climbing 3-5 steps with a railing? : A Lot 6 Click Score: 17    End of Session Equipment Utilized During Treatment: Gait belt Activity Tolerance: Patient limited by fatigue Patient left: with call bell/phone within reach;with chair alarm set;in chair Nurse Communication:  Mobility status PT Visit Diagnosis: Other abnormalities of gait and mobility (R26.89);Difficulty in walking, not elsewhere classified (R26.2);Muscle weakness (generalized) (M62.81);Unsteadiness on feet (R26.81)     Time: 6387-5643 PT Time Calculation (min) (ACUTE ONLY): 20 min  Charges:  $Gait Training: 8-22 mins                     Zachary Chambers, PT Acute Rehab Services Pager [phone] Zachary Chambers [facility] [place] 12/11/2020, 2:13 PM

## 2020-12-11 NOTE — Progress Notes (Signed)
PROGRESS NOTE    Zachary Chambers  KDT:[id number] DOB: May 04, 1951 DOA: 12/09/2020 PCP: [name], MD    Brief Narrative:  Mr. Zachary Chambers was admitted to the hospital with the working diagnosis of acute on chronic hypoxemic respiratory failure due to COPD exacerbation.   70 year old male past medical history for anxiety, coronary artery disease, COPD, dyslipidemia, GERD, history of CVA, alcohol abuse, atrial fibrillation and COVID-19 infection 06/2020, who presented with dyspnea.   He reported 2 days of dyspnea, slowly progressing and worsening, associated with palpitations and  increased sputum production.  His symptoms were refractory to increased flow of his home oxygen.  On his initial physical examination his systolic blood pressure was 200, repeat 141/76, heart rate 99, respiratory rate 15, oxygen saturation 96% on supplemental oxygen.  His lungs have bilateral rhonchi, no wheezing, heart S1-S2, present, regular, abdomen soft, no lower extremity edema.  Sodium 134, potassium 3.9, chloride 95, bicarb 25, glucose 112, BUN 16, creatinine 1.0, high sensitive troponin 333-272, arterial blood gas pH 7.39, PCO2 48.5, PO2 88, bicarb 31, oxygen saturation 97%.  White count 18.3, hemoglobin 16.4, hematocrit 52.3, platelets 325. SARS COVID-19 negative.  Chest radiograph with increased interstitial vascular infiltrates at bases bilaterally, homogeneous. CT chest with emphysematous changes, positive fluid in left fissure, bilateral atelectasis at bases.  No pulmonary embolism.  EKG 126 bpm, normal axis, normal intervals, sinus rhythm, no significant ST segment or T wave changes, noisy baseline.  Assessment & Plan:   Principal Problem:   COPD exacerbation ([place]) Active Problems:   CAD (coronary artery disease)   GERD (gastroesophageal reflux disease)   Heavy alcohol use   Dyslipidemia   S/P coronary artery stent placement   Transient atrial fibrillation (HCC)   Anxiety   History of CVA with  residual deficit   Acute on chronic respiratory failure with hypoxia (HCC)   Demand ischemia (HCC)   Hypoxemia   1. Acute on chronic hypoxemic respiratory failure due to COPD exacerbation.  At home uses walker for ambulation and 2.5 L/min 02 per [place].  Currently requiring 4 to 5 L oxygen.  Denies any shortness of breath.  Still has diminished breath sounds with end expiratory wheezes.  Continue steroids, bronchodilators and azithromycin.  Try to wean oxygen.  2. Paroxysmal atrial fibrillation.  Old records personally reviewed with echocardiogram from 02/2020 with preserved LV systolic function 55 to 99%, with preserved RV systolic pressure.  Rate controlled.  Continue metoprolol and Eliquis.   3. HTN Uncontrolled, dyslipidemia.  Blood pressure controlled.  Continue blood pressure.  Continue atorvastatin.  4. Hx CVA. Continue statin therapy and aspirin.   5. Alcohol abuse, anxiety. No clinical signs of withdrawal, continue with as needed lorazepam.  Continue hydroxyzine, tizanidine, and tramadol.   6. Reactive leukocytosis. Wbc trending dow, no signs of bacterial infection, repeat CBC tomorrow.  Status is: Inpatient  Remains inpatient appropriate because:Inpatient level of care appropriate due to severity of illness   Dispo: The patient is from: Home              Anticipated d/c is to: Home              Patient currently is not medically stable to d/c.   Difficult to place patient No       DVT prophylaxis: apixaban  Code Status:   full  Family Communication:  No family at the bedside     Subjective: Seen and examined.  Feels better.  No other complaint.  Objective:  Vitals:   12/10/20 2027 12/11/20 0348 12/11/20 0730 12/11/20 0820  BP: (!) 152/92 129/73 118/75   Pulse: 88 79 71   Resp: 19 20 15    Temp: 97.6 F (36.4 C) 97.8 F (36.6 C) 97.6 F (36.4 C)   TempSrc: Oral Oral Oral   SpO2: 98% 96% 98% 97%  Weight:      Height:       No intake or output data in the 24  hours ending 12/11/20 1157 Filed Weights   12/09/20 1758  Weight: 55.8 kg    Examination:   General exam: Appears calm and comfortable  Respiratory system: Diminished breath sounds with end expiratory wheezes bilaterally. Respiratory effort normal. Cardiovascular system: S1 & S2 heard, RRR. No JVD, murmurs, rubs, gallops or clicks. No pedal edema. Gastrointestinal system: Abdomen is nondistended, soft and nontender. No organomegaly or masses felt. Normal bowel sounds heard. Central nervous system: Alert and oriented. No focal neurological deficits. Extremities: Symmetric 5 x 5 power. Skin: No rashes, lesions or ulcers.  Psychiatry: Judgement and insight appear normal. Mood & affect appropriate.     Data Reviewed: I have personally reviewed following labs and imaging studies  CBC: Recent Labs  Lab 12/09/20 1729 12/09/20 1848 12/10/20 0417  WBC 18.3*  --  12.4*  NEUTROABS 15.9*  --   --   HGB 16.4 16.7 16.0  HCT 52.3* 49.0 49.1  MCV 85.9  --  83.1  PLT 325  --  053   Basic Metabolic Panel: Recent Labs  Lab 12/09/20 1729 12/09/20 1848 12/10/20 0417 12/11/20 0042  NA 134* 136 134* 134*  K 3.9 3.8 3.7 3.9  CL 95*  --  97* 94*  CO2 25  --  26 28  GLUCOSE 112*  --  166* 126*  BUN 16  --  18 22  CREATININE 1.02  --  1.03 1.00  CALCIUM 9.5  --  9.6 9.4   GFR: Estimated Creatinine Clearance: 53.5 mL/min (by C-G formula based on SCr of 1 mg/dL). Liver Function Tests: Recent Labs  Lab 12/10/20 0417  AST 36  ALT 21  ALKPHOS 93  BILITOT 1.2  PROT 7.2  ALBUMIN 3.7   No results for input(s): LIPASE, AMYLASE in the last 168 hours. No results for input(s): AMMONIA in the last 168 hours. Coagulation Profile: No results for input(s): INR, PROTIME in the last 168 hours. Cardiac Enzymes: No results for input(s): CKTOTAL, CKMB, CKMBINDEX, TROPONINI in the last 168 hours. BNP (last 3 results) No results for input(s): PROBNP in the last 8760 hours. HbA1C: No results for  input(s): HGBA1C in the last 72 hours. CBG: No results for input(s): GLUCAP in the last 168 hours. Lipid Profile: No results for input(s): CHOL, HDL, LDLCALC, TRIG, CHOLHDL, LDLDIRECT in the last 72 hours. Thyroid Function Tests: No results for input(s): TSH, T4TOTAL, FREET4, T3FREE, THYROIDAB in the last 72 hours. Anemia Panel: No results for input(s): VITAMINB12, FOLATE, FERRITIN, TIBC, IRON, RETICCTPCT in the last 72 hours.    Radiology Studies: I have reviewed all of the imaging during this hospital visit personally     Scheduled Meds: . apixaban  5 mg Oral BID  . aspirin EC  81 mg Oral QHS  . atorvastatin  40 mg Oral QHS  . azithromycin  250 mg Oral Daily  . fluticasone  2 spray Each Nare Daily  . fluticasone furoate-vilanterol  1 puff Inhalation Daily  . methylPREDNISolone (SOLU-MEDROL) injection  40 mg Intravenous Q12H  . metoprolol  tartrate  25 mg Oral BID  . pantoprazole  40 mg Oral Daily  . sodium chloride flush  3 mL Intravenous Q12H   Continuous Infusions:   LOS: 1 day  Total time spent in minutes: 34  Darliss Cheney, MD

## 2020-12-11 NOTE — Progress Notes (Signed)
Initial Nutrition Assessment  DOCUMENTATION CODES:   Severe malnutrition in context of chronic illness  INTERVENTION:   Downgrade to dysphagia 3 diet at pt's request due to issues with dentition  Boost Breeze po TID, each supplement provides 250 kcal and 9 grams of protein  Magic cup daily with lunch (all pt would agree to), each supplement provides 290 kcal and 9 grams of protein  HS snack daily  MVI with minerals daily  NUTRITION DIAGNOSIS:   Severe Malnutrition related to chronic illness (COPD; EtOH abuse) as evidenced by severe fat depletion,severe muscle depletion.  GOAL:   Patient will meet greater than or equal to 90% of their needs  MONITOR:   PO intake,Supplement acceptance,Weight trends,Labs,I & O's  REASON FOR ASSESSMENT:   Consult Assessment of nutrition requirement/status  ASSESSMENT:   Pt admitted with acute on chronic hypoxemic respiratory failure 2/2 COPD exacerbation. PMH includes anxiety, CAD, COPD, dyslipidemia, GERD, h/o CVA, EtOH abuse, Afib, and COVID-19 06/2020.   Discussed pt with RN.  Pt does not appear to be a reliable historian, often providing contradictory statements and became easily disctracted during conversation, often going on unrelated tangents. Pt does report that he does not eat structured meals throughout the day but instead snacks. Pt refusing Ensure due to "chalky" taste, but is agreeable to [employer], one Magic Cup with lunch, and an evening snack. Will monitor for adequacy of intake as RD observed untouched breakfast tray and pt reported not being interested in lunch. If intake does not improve, recommend discussion regarding GOC, specifically in regards to nutrition support as pt is severely malnourished.    Reviewed weight history. No significant weight changes noted.   UOP: 352ml documented x24 hours  Medications: solu-medrol, protonix Labs: Na 134 (L)  NUTRITION - FOCUSED PHYSICAL EXAM:  Flowsheet Row Most Recent  Value  Orbital Region Severe depletion  Upper Arm Region Severe depletion  Thoracic and Lumbar Region Severe depletion  Buccal Region Severe depletion  Temple Region Severe depletion  Clavicle Bone Region Severe depletion  Clavicle and Acromion Bone Region Severe depletion  Scapular Bone Region Severe depletion  Dorsal Hand Severe depletion  Patellar Region Severe depletion  Anterior Thigh Region Severe depletion  Posterior Calf Region Severe depletion  Edema (RD Assessment) None  Hair Other (Comment)  [thinning]  Eyes Reviewed  Mouth Other (Comment)  [several missing and broken teeth]  Skin Other (Comment)  [dry, flaking]  Nails Other (Comment)  [overgrown, yellowing]       Diet Order:   Diet Order            DIET DYS 3 Room service appropriate? Yes with Assist; Fluid consistency: Thin  Diet effective now                 EDUCATION NEEDS:   No education needs have been identified at this time  Skin:  Skin Assessment: Reviewed RN Assessment  Last BM:  PTA  Height:   Ht Readings from Last 1 Encounters:  12/09/20 5\' 8"  (1.727 m)    Weight:   Wt Readings from Last 1 Encounters:  12/09/20 55.8 kg    BMI:  Body mass index is 18.7 kg/m.  Estimated Nutritional Needs:   Kcal:  1700-1900  Protein:  85-95 grams  Fluid:  >1.7L/d    Larkin Ina, MS, RD, LDN RD pager number and weekend/on-call pager number located in [place].

## 2020-12-11 NOTE — Progress Notes (Signed)
Pt has been on apixaban 2.5mg  BID for hx CVA/PAF. He is <70 yo, scr<1.5. He meets the criteria for standard dose. Ok to increase to 5mg  BID per Dr Doristine Bosworth.  Onnie Boer, PharmD, BCIDP, AAHIVP, CPP Infectious Disease Pharmacist 12/11/2020 8:30 AM

## 2020-12-11 NOTE — Plan of Care (Signed)
  Problem: Education: Goal: Knowledge of General Education information will improve Description: Including pain rating scale, medication(s)/side effects and non-pharmacologic comfort measures Outcome: Progressing   Problem: Clinical Measurements: Goal: Will remain free from infection Outcome: Progressing   Problem: Activity: Goal: Risk for activity intolerance will decrease Outcome: Progressing   Problem: Nutrition: Goal: Adequate nutrition will be maintained Outcome: Progressing   Problem: Coping: Goal: Level of anxiety will decrease Outcome: Progressing   Problem: Safety: Goal: Ability to remain free from injury will improve Outcome: Progressing   

## 2020-12-12 LAB — CBC WITH DIFFERENTIAL/PLATELET
Abs Immature Granulocytes: 0.09 10*3/uL — ABNORMAL HIGH (ref 0.00–0.07)
Basophils Absolute: 0 10*3/uL (ref 0.0–0.1)
Basophils Relative: 0 %
Eosinophils Absolute: 0 10*3/uL (ref 0.0–0.5)
Eosinophils Relative: 0 %
HCT: 46.2 % (ref 39.0–52.0)
Hemoglobin: 15.2 g/dL (ref 13.0–17.0)
Immature Granulocytes: 1 %
Lymphocytes Relative: 2 %
Lymphs Abs: 0.3 10*3/uL — ABNORMAL LOW (ref 0.7–4.0)
MCH: 27.3 pg (ref 26.0–34.0)
MCHC: 32.9 g/dL (ref 30.0–36.0)
MCV: 82.9 fL (ref 80.0–100.0)
Monocytes Absolute: 0.2 10*3/uL (ref 0.1–1.0)
Monocytes Relative: 2 %
Neutro Abs: 9.7 10*3/uL — ABNORMAL HIGH (ref 1.7–7.7)
Neutrophils Relative %: 95 %
Platelets: 476 10*3/uL — ABNORMAL HIGH (ref 150–400)
RBC: 5.57 MIL/uL (ref 4.22–5.81)
RDW: 13.8 % (ref 11.5–15.5)
WBC: 10.2 10*3/uL (ref 4.0–10.5)
nRBC: 0 % (ref 0.0–0.2)

## 2020-12-12 LAB — BASIC METABOLIC PANEL
Anion gap: 10 (ref 5–15)
BUN: 27 mg/dL — ABNORMAL HIGH (ref 8–23)
CO2: 30 mmol/L (ref 22–32)
Calcium: 9.3 mg/dL (ref 8.9–10.3)
Chloride: 93 mmol/L — ABNORMAL LOW (ref 98–111)
Creatinine, Ser: 0.98 mg/dL (ref 0.61–1.24)
GFR, Estimated: >60 mL/min (ref >60)
Glucose, Bld: 117 mg/dL — ABNORMAL HIGH (ref 70–99)
Potassium: 3.8 mmol/L (ref 3.5–5.1)
Sodium: 133 mmol/L — ABNORMAL LOW (ref 135–145)

## 2020-12-12 MED ORDER — PREDNISONE 50 MG PO TABS: 50.0000 mg | ORAL_TABLET | Freq: Every day | ORAL | 0 refills | Status: AC

## 2020-12-12 MED ORDER — APIXABAN 5 MG PO TABS: 5.0000 mg | ORAL_TABLET | Freq: Two times a day (BID) | ORAL | 0 refills | Status: DC

## 2020-12-12 NOTE — Discharge Instructions (Signed)

## 2020-12-12 NOTE — Care Management Important Message (Signed)
Important Message  Patient Details  Name: Zachary Chambers MRN: [id number] Date of Birth: 1950-10-01   Medicare Important Message Given:  Yes     [name] Montine Circle 12/12/2020, 11:57 AM

## 2020-12-12 NOTE — Consult Note (Addendum)
   [facility] CM Inpatient Consult   12/12/2020  KAZUKI INGLE 1951-07-12 [id number]   [place] Organization [ACO] Patient: [employer] Medicare  Risk Score for unplanned readmission:  13%  Patient was screened for [place] Management services and found that patient is in an [facility] which has a chronic disease management Embedded Care Management team.  Primary Care Provider:  Notes reflect patient has changed to [name], MD but listed with Vic Blackbird, MD.  Plan: Attempts to reach out to patient by hospital phone without success to follow up on primary care follow up.   Please contact for further questions,  Natividad Brood, RN BSN [facility] [facility] Liaison  [phone] business mobile phone Toll free office [phone]  Fax number: [phone] [email or web address]

## 2020-12-12 NOTE — TOC Progression Note (Signed)
Transition of Care [facility]) - Progression Note    Patient Details  Name: Zachary Chambers MRN: [id number] Date of Birth: 10/06/1950  [facility]) CM/SW Contact  Joanne Chars, LCSW Phone Number: 12/12/2020, 9:31 AM  Clinical Narrative:   CSW spoke with pt about choice of [place] providers.  Pt asking about out of pocket cost, CSW contacted [facility] and [facility] to ask about out of pocket cost, both report there is none with [employer].  This information was given to pt but he remains skeptical.  Pt is willing to work with [facility], "not everyday" and is requesting to speak with rep from [facility] on the phone.  Pt reports he already has bedside commode.  [place] Hun will pick him up today.  CSW confirmed that she will bring home O2 for the ride home.     Expected Discharge Plan: [place] Barriers to Discharge: Continued Medical Work up  Expected Discharge Plan and Services Expected Discharge Plan: [place] Choice: [place] arrangements for the past 2 months: Single Family Home Expected Discharge Date: 12/12/20                                     Social Determinants of Health (SDOH) Interventions    Readmission Risk Interventions No flowsheet data found.

## 2020-12-12 NOTE — TOC Transition Note (Addendum)
Transition of Care [facility]) - CM/SW Discharge Note   Patient Details  Name: Zachary Chambers MRN: [id number] Date of Birth: 09-02-1950  Transition of Care [facility]) CM/SW Contact:  Joanne Chars, LCSW Phone Number: 12/12/2020, 10:30 AM   Clinical Narrative:  Pt discharging home with [facility].  No equipment needs.  Fiancee to transport.  No other needs identified.    1500: RN received call from pt fiancee about needing bigger O2 tanks.  CSW called [facility], pt is not a client there.  CSW called [employer] and pt is active there, they explained options if O2 is running out too quickly.  Also mentioned pt account was on hold, does have a balance, but that they could still work with him.  CSW waiting for fiancee to arrive to take pt home to share this information but she has not yet arrived.  CSW spoke with pt about options with [employer], CSW also spoke with pt son and updated him on the above.    Final next level of care: [place] Barriers to Discharge: Barriers Resolved   Patient Goals and CMS Choice Patient states their goals for this hospitalization and ongoing recovery are:: "be able to mow the grass, take care of my property" CMS Medicare.gov Compare Post Acute Care list provided to:: Patient Choice offered to / list presented to : Patient  Discharge Placement                       Discharge Plan and Services     Post Acute Care Choice: Home Health          DME Arranged: N/A (Pt has all needed equipment)         HH Arranged: PT,OT [place] Agency: [facility] [place] Date [facility] Agency Contacted: 12/12/20 Time [place]: [id number] Representative spoke with at [place]: [place] ([place]) Interventions     Readmission Risk Interventions No flowsheet data found.

## 2020-12-12 NOTE — Discharge Summary (Signed)
Physician Discharge Summary  Zachary Chambers [id number] DOB: [birth year] DOA: 12/09/2020  PCP: [name], MD  Admit date: 12/09/2020 Discharge date: 12/12/2020 30 Day Unplanned Readmission Risk Score   Flowsheet Row ED to Hosp-Admission (Current) from 12/09/2020 in [facility]  30 Day Unplanned Readmission Risk Score (%) 12.66 Filed at 12/12/2020 0801     This score is the patient's risk of an unplanned readmission within 30 days of being discharged (0 -100%). The score is based on dignosis, age, lab data, medications, orders, and past utilization.   Low:  0-14.9   Medium: 15-21.9   High: 22-29.9   Extreme: 30 and above         Admitted From: Home Disposition: Home  Recommendations for Outpatient Follow-up:  1. Follow up with PCP in 1-2 weeks 2. Please obtain BMP/CBC in one week 3. Please follow up with your PCP on the following pending results: Unresulted Labs (From admission, onward)         None        Home Health: Yes Equipment/Devices: None  Discharge Condition: Stable CODE STATUS: Partial code Diet recommendation: Cardiac  Subjective: Seen and examined.  Feels much better.  Down to 3 L of oxygen which is very close to his baseline.  Ready to go home.  Brief/Interim Summary: 70 year old male past medical history for anxiety, coronary artery disease, COPD, dyslipidemia, GERD, history of CVA, alcohol abuse, atrial fibrillation and COVID-19 infection 06/2020 presented with 2 days of dyspnea, slowly progressing and worsening, associated with palpitations and  increased sputum production.  His symptoms were refractory to increased flow of his home oxygen.    Upon arrival to ED, on his initial physical examination his systolic blood pressure was 200, repeat 141/76, heart rate 99, respiratory rate 15, oxygen saturation 96% on supplemental oxygen.  high sensitive troponin 333-272, arterial blood gas pH 7.39, PCO2 48.5, PO2 88, bicarb 31, oxygen  saturation 97%. White count 18.3, hemoglobin 16.4, hematocrit 52.3, platelets 325. SARS COVID-19 negative. Chest radiograph with increased interstitial vascular infiltrates at bases bilaterally, homogeneous.CT chest with emphysematous changes, positive fluid in left fissure, bilateral atelectasis at bases.  No pulmonary embolism.  Patient was admitted under hospital service with acute on chronic hypoxic respiratory failure secondary to acute COPD exacerbation.  He was started on steroids, bronchodilators as well as azithromycin.  Over the course of last few days, patient has improved significantly.  He uses 2 to 2.5 L oxygen at home.  Currently he is requiring 3 L.  Patient was evaluated by PT OT who recommended SNF.  Patient refused offers for SNF and he instead wants to go home with home health which has been ordered for him.  Now that he has improved with no symptoms and back to his baseline oxygen requirement, he is being discharged in stable condition.  We will prescribe him few more days of oral prednisone.  Discharge Diagnoses:  Principal Problem:   COPD exacerbation ([place]) Active Problems:   CAD (coronary artery disease)   GERD (gastroesophageal reflux disease)   Heavy alcohol use   Dyslipidemia   S/P coronary artery stent placement   Transient atrial fibrillation (HCC)   Anxiety   History of CVA with residual deficit   Acute on chronic respiratory failure with hypoxia ([place])   Demand ischemia (HCC)   Hypoxemia    Discharge Instructions   Allergies as of 12/12/2020   No Known Allergies     Medication List  TAKE these medications   albuterol (2.5 MG/3ML) 0.083% nebulizer solution Commonly known as: PROVENTIL Take 3 mLs (2.5 mg total) by nebulization every 4 (four) hours as needed for wheezing or shortness of breath. What changed: Another medication with the same name was changed. Make sure you understand how and when to take each.   albuterol 108 (90 Base) MCG/ACT  inhaler Commonly known as: VENTOLIN HFA INHALE 2 PUFFS INTO THE LUNGS EVERY 4 HOURS AS NEEDED FOR WHEEZING AND SHORTNESS OF BREATH What changed: See the new instructions.   apixaban 5 MG Tabs tablet Commonly known as: ELIQUIS Take 1 tablet (5 mg total) by mouth 2 (two) times daily. What changed:   medication strength  how much to take  when to take this   aspirin 81 MG tablet Take 81 mg by mouth at bedtime.   atorvastatin 40 MG tablet Commonly known as: LIPITOR Take 1 tablet (40 mg total) by mouth at bedtime.   benzonatate 100 MG capsule Commonly known as: TESSALON Take 1 capsule (100 mg total) by mouth 2 (two) times daily as needed for cough.   Breztri Aerosphere 160-9-4.8 MCG/ACT Aero Generic drug: Budeson-Glycopyrrol-Formoterol Inhale 2 puffs into the lungs 2 (two) times daily.   budesonide-formoterol 160-4.5 MCG/ACT inhaler Commonly known as: Symbicort Inhale 2 puffs into the lungs 2 (two) times daily.   celecoxib 100 MG capsule Commonly known as: CELEBREX Take 100 mg by mouth in the morning and at bedtime.   fluticasone 50 MCG/ACT nasal spray Commonly known as: FLONASE USE 2 SPRAYS in EACH NOSTRIL EVERY DAY   folic acid 1 MG tablet Commonly known as: FOLVITE TAKE 1 TABLET BY MOUTH EVERY DAY   GLUCOSAMINE PO Take 1,500 mg by mouth daily.   hydrOXYzine 10 MG tablet Commonly known as: ATARAX/VISTARIL TAKE 1 TABLET BY MOUTH 3 TIMES DAILY AS NEEDED What changed: reasons to take this   LORazepam 0.5 MG tablet Commonly known as: ATIVAN Take 1 tablet (0.5 mg total) by mouth 2 (two) times daily as needed for anxiety.   multivitamin with minerals Tabs tablet Take 1 tablet by mouth daily.   nitroGLYCERIN 0.4 MG SL tablet Commonly known as: Nitrostat dissolve 1 TABLET UNDER THE TONGUE every 5 minutes AS NEEDED CHEST PAIN   omeprazole 40 MG capsule Commonly known as: PRILOSEC Take 1 capsule (40 mg total) by mouth daily.   predniSONE 50 MG tablet Commonly  known as: DELTASONE Take 1 tablet (50 mg total) by mouth daily with breakfast for 3 days.   tiZANidine 4 MG tablet Commonly known as: ZANAFLEX TAKE 1 TABLET BY MOUTH EVERY EIGHT HOURS AS NEEDED FOR MUSCLE SPASMS What changed: See the new instructions.   traMADol 50 MG tablet Commonly known as: ULTRAM TAKE 1 TABLET BY MOUTH EVERY 6 HOURS AS NEEDED FOR moderate PAIN What changed:   how much to take  how to take this  when to take this  reasons to take this  additional instructions   VITAMIN B-12 PO Take 1 tablet by mouth daily.   VITAMIN B-3 PO Take 1 tablet by mouth daily.   zinc gluconate 50 MG tablet Take 50 mg by mouth daily.       Follow-up Information    [place], Modena Nunnery, MD Follow up in 1 week(s).   Specialty: Family Medicine Contact information: [address] [place] [address] [phone]        Josue Hector, MD .   Specialty: Cardiology Contact information: [phone]  [address] [place] [address] [phone]              No Known Allergies  Consultations: NONE   Procedures/Studies: CT Angio Chest PE W and/or Wo Contrast  Result Date: 12/09/2020 CLINICAL DATA:  Pulmonary embolus suspected with low to intermediate probability. Positive D-dimer. Shortness of breath and palpitations. EXAM: CT ANGIOGRAPHY CHEST WITH CONTRAST TECHNIQUE: Multidetector CT imaging of the chest was performed using the standard protocol during bolus administration of intravenous contrast. Multiplanar CT image reconstructions and MIPs were obtained to evaluate the vascular anatomy. CONTRAST:  34mL OMNIPAQUE IOHEXOL 350 MG/ML SOLN COMPARISON:  07/04/2020 FINDINGS: Cardiovascular: Good opacification of the central and segmental pulmonary arteries. No focal filling defects. No evidence of significant pulmonary embolus. Normal heart size. Small pericardial effusion. Coronary artery and aortic calcifications. No aortic aneurysm. Great vessel origins  are patent. Mediastinum/Nodes: Esophagus is decompressed. No significant lymphadenopathy. Lungs/Pleura: Emphysematous changes in the lungs. Bilateral basilar atelectasis. No pleural effusions. No pneumothorax. Airways are patent. Upper Abdomen: No acute abnormalities demonstrated in the visualized upper abdomen. Musculoskeletal: No chest wall abnormality. No acute or significant osseous findings. Review of the MIP images confirms the above findings. IMPRESSION: 1. No evidence of significant pulmonary embolus. 2. Small pericardial effusion. 3. Emphysematous changes in the lungs. 4. Bilateral basilar atelectasis. 5. Emphysema and aortic atherosclerosis. Aortic Atherosclerosis (ICD10-I70.0) and Emphysema (ICD10-J43.9). Electronically Signed   By: Lucienne Capers M.D.   On: 12/09/2020 21:25   DG Chest Port 1 View  Result Date: 12/09/2020 CLINICAL DATA:  Dyspnea EXAM: PORTABLE CHEST 1 VIEW COMPARISON:  July 04, 2020 FINDINGS: The heart size and mediastinal contours are within normal limits. Scarring in the lung bases. No focal consolidation. No significant pleural effusion or visible pneumothorax. The visualized skeletal structures are unremarkable. IMPRESSION: No active disease. Electronically Signed   By: Dahlia Bailiff MD   On: 12/09/2020 18:39   ECHOCARDIOGRAM COMPLETE  Result Date: 12/10/2020    ECHOCARDIOGRAM REPORT   Patient Name:   JACORRI CROMBIE Date of Exam: 12/10/2020 Medical Rec #:  [id number]   Height:       68.0 in Accession #:    [id number]  Weight:       123.0 lb Date of Birth:  03-03-1951  BSA:          1.662 m Patient Age:    61 years    BP:           130/87 mmHg Patient Gender: M           HR:           93 bpm. Exam Location:  Inpatient Procedure: 2D Echo, Cardiac Doppler and Color Doppler Indications:    Dyspnea  History:        Patient has prior history of Echocardiogram examinations, most                 recent 03/07/2020. Previous Myocardial Infarction, COPD; Risk                  Factors:Former Smoker and Dyslipidemia. GERD. H/O PCI circumflex                 and LAD. H/O atrial fibrillation. AAA.  Sonographer:    Clayton Lefort RDCS (AE) Referring Phys: [id number] [place]  1. Left ventricular ejection fraction, by estimation, is 60 to 65%. The left ventricle has normal function. The left ventricle has no regional wall motion abnormalities.  Left ventricular diastolic parameters are consistent with Grade II diastolic dysfunction (pseudonormalization). Elevated left atrial pressure.  2. Right ventricular systolic function is mildly reduced. The right ventricular size is normal. There is mildly elevated pulmonary artery systolic pressure.  3. Moderate pericardial effusion. The pericardial effusion is anterior to the right ventricle and surrounding the apex. There is equivocal evidence of cardiac tamponade.  4. The mitral valve is normal in structure. No evidence of mitral valve regurgitation.  5. The aortic valve is tricuspid. Aortic valve regurgitation is not visualized. Mild aortic valve sclerosis is present, with no evidence of aortic valve stenosis.  6. The inferior vena cava is dilated in size with >50% respiratory variability, suggesting right atrial pressure of 8 mmHg.  7. There is enhanced respiratory displacement of the ventricular septum and mitral inflow respiratory variation in early filling velocities. There is no RA or RV collapse. The inferior vena cava is mildly dilated, but not plethoric.  8. The overall impression is that the above abnormalities are due to increased work of breathing (e.g. COPD exacerbation), rather than pericardial tamponade. Comparison(s): Prior images reviewed side by side. The left ventricular function is unchanged. The right ventricular systolic function is unchanged. The pericardial effusion is only slightly larger compared to last year's study. Increased respiratory flow variation on the mitral inflow was also seen at that time. The  inferior vena cava is more dilated on the current study. Conclusion(s)/Recommendation(s): Consider serial studies for the pericardial effusion, but pericardial tamponade is felt to be unlikely. FINDINGS  Left Ventricle: Left ventricular ejection fraction, by estimation, is 60 to 65%. The left ventricle has normal function. The left ventricle has no regional wall motion abnormalities. The left ventricular internal cavity size was normal in size. There is  no left ventricular hypertrophy. Left ventricular diastolic parameters are consistent with Grade II diastolic dysfunction (pseudonormalization). Elevated left atrial pressure. Right Ventricle: The right ventricular size is normal. No increase in right ventricular wall thickness. Right ventricular systolic function is mildly reduced. There is mildly elevated pulmonary artery systolic pressure. The tricuspid regurgitant velocity  is 2.85 m/s, and with an assumed right atrial pressure of 8 mmHg, the estimated right ventricular systolic pressure is [id number] mmHg. Left Atrium: Left atrial size was normal in size. Right Atrium: Right atrial size was normal in size. Pericardium: A moderately sized pericardial effusion is present. The pericardial effusion is anterior to the right ventricle and surrounding the apex. There is no evidence of cardiac tamponade. Mitral Valve: The mitral valve is normal in structure. No evidence of mitral valve regurgitation. MV peak gradient, 2.9 mmHg. The mean mitral valve gradient is 1.0 mmHg. Tricuspid Valve: The tricuspid valve is normal in structure. Tricuspid valve regurgitation is trivial. Aortic Valve: The aortic valve is tricuspid. Aortic valve regurgitation is not visualized. Mild aortic valve sclerosis is present, with no evidence of aortic valve stenosis. Aortic valve mean gradient measures 1.0 mmHg. Aortic valve peak gradient measures 2.9 mmHg. Aortic valve area, by VTI measures 4.50 cm. Pulmonic Valve: The pulmonic valve was normal in  structure. Pulmonic valve regurgitation is not visualized. Aorta: The aortic root is normal in size and structure. Venous: The inferior vena cava is dilated in size with greater than 50% respiratory variability, suggesting right atrial pressure of 8 mmHg. IAS/Shunts: The interatrial septum was not well visualized.  LEFT VENTRICLE PLAX 2D LVIDd:         3.80 cm  Diastology LVIDs:         2.80  cm  LV e' medial:    6.64 cm/s LV PW:         1.10 cm  LV E/e' medial:  13.2 LV IVS:        1.10 cm  LV e' lateral:   7.72 cm/s LVOT diam:     2.10 cm  LV E/e' lateral: 11.4 LV SV:         59 LV SV Index:   35 LVOT Area:     3.46 cm  RIGHT VENTRICLE             IVC RV Basal diam:  2.90 cm     IVC diam: 1.90 cm RV S prime:     10.30 cm/s TAPSE (M-mode): 2.2 cm LEFT ATRIUM             Index       RIGHT ATRIUM           Index LA diam:        3.00 cm 1.80 cm/m  RA Area:     11.90 cm LA Vol (A2C):   23.5 ml 14.14 ml/m RA Volume:   24.70 ml  14.86 ml/m LA Vol (A4C):   25.6 ml 15.40 ml/m LA Biplane Vol: 24.7 ml 14.86 ml/m  AORTIC VALVE AV Area (Vmax):    3.14 cm AV Area (Vmean):   3.62 cm AV Area (VTI):     4.50 cm AV Vmax:           85.20 cm/s AV Vmean:          48.700 cm/s AV VTI:            0.130 m AV Peak Grad:      2.9 mmHg AV Mean Grad:      1.0 mmHg LVOT Vmax:         77.20 cm/s LVOT Vmean:        50.900 cm/s LVOT VTI:          0.169 m LVOT/AV VTI ratio: 1.30  AORTA Ao Root diam: 3.60 cm MITRAL VALVE               TRICUSPID VALVE MV Area (PHT): 2.99 cm    TR Peak grad:   32.5 mmHg MV Area VTI:   2.91 cm    TR Vmax:        285.00 cm/s MV Peak grad:  2.9 mmHg MV Mean grad:  1.0 mmHg    SHUNTS MV Vmax:       0.85 m/s    Systemic VTI:  0.17 m MV Vmean:      52.1 cm/s   Systemic Diam: 2.10 cm MV Decel Time: 254 msec MV E velocity: 87.80 cm/s MV A velocity: 75.00 cm/s MV E/A ratio:  1.17 Mihai Croitoru MD Electronically signed by Sanda Klein MD Signature Date/Time: 12/10/2020/6:45:02 PM    Final       Discharge  Exam: Vitals:   12/12/20 0741 12/12/20 0806  BP:  135/73  Pulse: 66   Resp: 16 18  Temp:  97.8 F (36.6 C)  SpO2: 95% 97%   Vitals:   12/12/20 0000 12/12/20 0600 12/12/20 0741 12/12/20 0806  BP: 90/60 118/77  135/73  Pulse: 68 74 66   Resp: 20 10 16 18   Temp: 97.7 F (36.5 C) 97.6 F (36.4 C)  97.8 F (36.6 C)  TempSrc: Oral Oral    SpO2: 98% 97% 95% 97%  Weight:      Height:  General: Pt is alert, awake, not in acute distress Cardiovascular: RRR, S1/S2 +, no rubs, no gallops Respiratory: Diminished breath sounds bilaterally, no wheezing, no rhonchi Abdominal: Soft, NT, ND, bowel sounds + Extremities: no edema, no cyanosis    The results of significant diagnostics from this hospitalization (including imaging, microbiology, ancillary and laboratory) are listed below for reference.     Microbiology: Recent Results (from the past 240 hour(s))  Resp Panel by RT-PCR (Flu A&B, Covid) Nasopharyngeal Swab     Status: None   Collection Time: 12/09/20  5:46 PM   Specimen: Nasopharyngeal Swab; Nasopharyngeal(NP) swabs in vial transport medium  Result Value Ref Range Status   SARS Coronavirus 2 by RT PCR NEGATIVE NEGATIVE Final    Comment: (NOTE) SARS-CoV-2 target nucleic acids are NOT DETECTED.  The SARS-CoV-2 RNA is generally detectable in upper respiratory specimens during the acute phase of infection. The lowest concentration of SARS-CoV-2 viral copies this assay can detect is 138 copies/mL. A negative result does not preclude SARS-Cov-2 infection and should not be used as the sole basis for treatment or other patient management decisions. A negative result may occur with  improper specimen collection/handling, submission of specimen other than nasopharyngeal swab, presence of viral mutation(s) within the areas targeted by this assay, and inadequate number of viral copies(<138 copies/mL). A negative result must be combined with clinical observations, patient  history, and epidemiological information. The expected result is Negative.  Fact Sheet for Patients:  EntrepreneurPulse.com.au  Fact Sheet for Healthcare Providers:  IncredibleEmployment.be  This test is no t yet approved or cleared by the [place] FDA and  has been authorized for detection and/or diagnosis of SARS-CoV-2 by FDA under an Emergency Use Authorization (EUA). This EUA will remain  in effect (meaning this test can be used) for the duration of the COVID-19 declaration under Section 564(b)(1) of the Act, 21 U.S.C.section 360bbb-3(b)(1), unless the authorization is terminated  or revoked sooner.       Influenza A by PCR NEGATIVE NEGATIVE Final   Influenza B by PCR NEGATIVE NEGATIVE Final    Comment: (NOTE) The Xpert Xpress SARS-CoV-2/FLU/RSV plus assay is intended as an aid in the diagnosis of influenza from Nasopharyngeal swab specimens and should not be used as a sole basis for treatment. Nasal washings and aspirates are unacceptable for Xpert Xpress SARS-CoV-2/FLU/RSV testing.  Fact Sheet for Patients: EntrepreneurPulse.com.au  Fact Sheet for Healthcare Providers: IncredibleEmployment.be  This test is not yet approved or cleared by the [place] FDA and has been authorized for detection and/or diagnosis of SARS-CoV-2 by FDA under an Emergency Use Authorization (EUA). This EUA will remain in effect (meaning this test can be used) for the duration of the COVID-19 declaration under Section 564(b)(1) of the Act, 21 U.S.C. section 360bbb-3(b)(1), unless the authorization is terminated or revoked.  Performed at [facility], [place] [address] [place], [place] [address]      Labs: BNP (last 3 results) No results for input(s): BNP in the last 8760 hours. Basic Metabolic Panel: Recent Labs  Lab 12/09/20 1729 12/09/20 1848 12/10/20 0417 12/11/20 0042 12/12/20 0208  NA 134* 136  134* 134* 133*  K 3.9 3.8 3.7 3.9 3.8  CL 95*  --  97* 94* 93*  CO2 25  --  26 28 30   GLUCOSE 112*  --  166* 126* 117*  BUN 16  --  18 22 27*  CREATININE 1.02  --  1.03 1.00 0.98  CALCIUM 9.5  --  9.6 9.4 9.3   Liver Function Tests: Recent Labs  Lab 12/10/20 0417  AST 36  ALT 21  ALKPHOS 93  BILITOT 1.2  PROT 7.2  ALBUMIN 3.7   No results for input(s): LIPASE, AMYLASE in the last 168 hours. No results for input(s): AMMONIA in the last 168 hours. CBC: Recent Labs  Lab 12/09/20 1729 12/09/20 1848 12/10/20 0417 12/12/20 0208  WBC 18.3*  --  12.4* 10.2  NEUTROABS 15.9*  --   --  9.7*  HGB 16.4 16.7 16.0 15.2  HCT 52.3* 49.0 49.1 46.2  MCV 85.9  --  83.1 82.9  PLT 325  --  321 476*   Cardiac Enzymes: No results for input(s): CKTOTAL, CKMB, CKMBINDEX, TROPONINI in the last 168 hours. BNP: Invalid input(s): POCBNP CBG: No results for input(s): GLUCAP in the last 168 hours. D-Dimer Recent Labs    12/09/20 1745  DDIMER 0.93*   Hgb A1c No results for input(s): HGBA1C in the last 72 hours. Lipid Profile No results for input(s): CHOL, HDL, LDLCALC, TRIG, CHOLHDL, LDLDIRECT in the last 72 hours. Thyroid function studies No results for input(s): TSH, T4TOTAL, T3FREE, THYROIDAB in the last 72 hours.  Invalid input(s): FREET3 Anemia work up No results for input(s): VITAMINB12, FOLATE, FERRITIN, TIBC, IRON, RETICCTPCT in the last 72 hours. Urinalysis No results found for: COLORURINE, APPEARANCEUR, [place], [place], GLUCOSEU, [place], [place], [place], PROTEINUR, UROBILINOGEN, NITRITE, LEUKOCYTESUR Sepsis Labs Invalid input(s): PROCALCITONIN,  WBC,  LACTICIDVEN Microbiology Recent Results (from the past 240 hour(s))  Resp Panel by RT-PCR (Flu A&B, Covid) Nasopharyngeal Swab     Status: None   Collection Time: 12/09/20  5:46 PM   Specimen: Nasopharyngeal Swab; Nasopharyngeal(NP) swabs in vial transport medium  Result Value Ref Range Status   SARS Coronavirus 2 by  RT PCR NEGATIVE NEGATIVE Final    Comment: (NOTE) SARS-CoV-2 target nucleic acids are NOT DETECTED.  The SARS-CoV-2 RNA is generally detectable in upper respiratory specimens during the acute phase of infection. The lowest concentration of SARS-CoV-2 viral copies this assay can detect is 138 copies/mL. A negative result does not preclude SARS-Cov-2 infection and should not be used as the sole basis for treatment or other patient management decisions. A negative result may occur with  improper specimen collection/handling, submission of specimen other than nasopharyngeal swab, presence of viral mutation(s) within the areas targeted by this assay, and inadequate number of viral copies(<138 copies/mL). A negative result must be combined with clinical observations, patient history, and epidemiological information. The expected result is Negative.  Fact Sheet for Patients:  EntrepreneurPulse.com.au  Fact Sheet for Healthcare Providers:  IncredibleEmployment.be  This test is no t yet approved or cleared by the [place] FDA and  has been authorized for detection and/or diagnosis of SARS-CoV-2 by FDA under an Emergency Use Authorization (EUA). This EUA will remain  in effect (meaning this test can be used) for the duration of the COVID-19 declaration under Section 564(b)(1) of the Act, 21 U.S.C.section 360bbb-3(b)(1), unless the authorization is terminated  or revoked sooner.       Influenza A by PCR NEGATIVE NEGATIVE Final   Influenza B by PCR NEGATIVE NEGATIVE Final    Comment: (NOTE) The Xpert Xpress SARS-CoV-2/FLU/RSV plus assay is intended as an aid in the diagnosis of influenza from Nasopharyngeal swab specimens and should not be used as a sole basis for treatment. Nasal washings and aspirates are unacceptable for Xpert Xpress SARS-CoV-2/FLU/RSV testing.  Fact Sheet for Patients: EntrepreneurPulse.com.au  Fact Sheet  for Healthcare Providers: IncredibleEmployment.be  This test is not yet approved or cleared by the [place] and has been authorized for detection and/or diagnosis of SARS-CoV-2 by FDA under an Emergency Use Authorization (EUA). This EUA will remain in effect (meaning this test can be used) for the duration of the COVID-19 declaration under Section 564(b)(1) of the Act, 21 U.S.C. section 360bbb-3(b)(1), unless the authorization is terminated or revoked.  Performed at [facility], [place] [address], [place], [place] [address]      Time coordinating discharge: Over 30 minutes  SIGNED:   Darliss Cheney, MD  [facility] 12/12/2020, 8:32 AM  If 7PM-7AM, please contact night-coverage [email or web address]

## 2020-12-13 ENCOUNTER — Telehealth: Payer: Self-pay | Admitting: Family Medicine

## 2020-12-13 DIAGNOSIS — J449 Chronic obstructive pulmonary disease, unspecified: Secondary | ICD-10-CM | POA: Diagnosis not present

## 2020-12-13 NOTE — Telephone Encounter (Signed)
Transition Care Management Follow-up Telephone Call  Date of discharge and from where: 12/12/2020 from [facility]   How have you been since you were released from the hospital? Patient states he is doing ok having issues off and on   Any questions or concerns? No  Items Reviewed:  Did the pt receive and understand the discharge instructions provided? Yes   Medications obtained and verified? Yes   Other? No   Any new allergies since your discharge? No   Dietary orders reviewed? Yes  Do you have support at home? Yes   Home Care and Equipment/Supplies: Were home health services ordered? yes If so, what is the name of the agency? [facility]  Has the agency set up a time to come to the patient's home? yes Were any new equipment or medical supplies ordered?  No What is the name of the medical supply agency? N/A Were you able to get the supplies/equipment? not applicable Do you have any questions related to the use of the equipment or supplies? No  Functional Questionnaire: (I = Independent and D = Dependent) ADLs: D  Bathing/Dressing- D  Meal Prep- D  Eating- I  Maintaining continence- I  Transferring/Ambulation- I with assistive device (Cane, walker)  Managing Meds- D  Follow up appointments reviewed:   PCP Hospital f/u appt confirmed? Yes  Scheduled to see Dr.Pickard on 12/19/2021 @ 11:30 am .  [facility] f/u appt confirmed? No  .  Are transportation arrangements needed? No   If their condition worsens, is the pt aware to call PCP or go to the Emergency Dept.? Yes  Was the patient provided with contact information for the PCP's office or ED? Yes  Was to pt encouraged to call back with questions or concerns? Yes

## 2020-12-14 DIAGNOSIS — M6281 Muscle weakness (generalized): Secondary | ICD-10-CM | POA: Diagnosis not present

## 2020-12-14 DIAGNOSIS — J449 Chronic obstructive pulmonary disease, unspecified: Secondary | ICD-10-CM | POA: Diagnosis not present

## 2020-12-20 ENCOUNTER — Inpatient Hospital Stay: Payer: Medicare HMO | Admitting: Family Medicine

## 2020-12-25 ENCOUNTER — Other Ambulatory Visit: Payer: Self-pay | Admitting: Family Medicine

## 2020-12-26 ENCOUNTER — Telehealth (INDEPENDENT_AMBULATORY_CARE_PROVIDER_SITE_OTHER): Payer: Medicare HMO | Admitting: Family Medicine

## 2020-12-26 ENCOUNTER — Other Ambulatory Visit: Payer: Self-pay

## 2020-12-26 DIAGNOSIS — J449 Chronic obstructive pulmonary disease, unspecified: Secondary | ICD-10-CM

## 2020-12-26 DIAGNOSIS — I4891 Unspecified atrial fibrillation: Secondary | ICD-10-CM | POA: Diagnosis not present

## 2020-12-26 DIAGNOSIS — J441 Chronic obstructive pulmonary disease with (acute) exacerbation: Secondary | ICD-10-CM | POA: Diagnosis not present

## 2020-12-26 DIAGNOSIS — I6930 Unspecified sequelae of cerebral infarction: Secondary | ICD-10-CM | POA: Diagnosis not present

## 2020-12-27 ENCOUNTER — Telehealth: Payer: Self-pay | Admitting: *Deleted

## 2020-12-27 DIAGNOSIS — E43 Unspecified severe protein-calorie malnutrition: Secondary | ICD-10-CM

## 2020-12-27 DIAGNOSIS — I6930 Unspecified sequelae of cerebral infarction: Secondary | ICD-10-CM

## 2020-12-27 DIAGNOSIS — F419 Anxiety disorder, unspecified: Secondary | ICD-10-CM

## 2020-12-27 DIAGNOSIS — J449 Chronic obstructive pulmonary disease, unspecified: Secondary | ICD-10-CM

## 2020-12-27 DIAGNOSIS — I4891 Unspecified atrial fibrillation: Secondary | ICD-10-CM

## 2020-12-27 NOTE — Telephone Encounter (Signed)
Referral orders placed

## 2020-12-27 NOTE — Telephone Encounter (Signed)
-----   Message from Edythe Clarity, [facility] sent at 12/27/2020  8:56 AM EDT ----- Marykay Lex, this patient is not on our original list to schedule so he will need a referral.  Would you mind putting that in for Dr. Dennard Schaumann?  Thanks!  ----- Message ----- From: [name], MD Sent: 12/26/2020   4:54 PM EDT To: Edythe Clarity, [facility]  Can you schedule to see this patient.  He can't afford his copd med.  Basically using samples of breztri or trelegy or whatever.  When he stops he winds up in hospital.  I need to try to get him on something consistently.

## 2020-12-27 NOTE — Progress Notes (Signed)
Subjective:    Patient ID: Zachary Chambers, male    DOB: Apr 03, 1951, 70 y.o.   MRN: [id number]  HPI Patient is being seen today as a telephone visit.  Phone call began around 415.  Phone call concluded around 432.  Patient consents and request to be seen by telephone.  He is currently at home.  I am currently in my office.  The appointment was originally scheduled to be a hospital follow-up in the office.  However the patient developed nausea today and did not feel like he could come to the office.  Admit date: 12/09/2020 Discharge date: 12/12/2020    30 Day Unplanned Readmission Risk Score   Flowsheet Row ED to Hosp-Admission (Current) from 12/09/2020 in [place] [facility]  30 Day Unplanned Readmission Risk Score (%) 12.66 Filed at 12/12/2020 0801              This score is the patient's risk of an unplanned readmission within 30 days of being discharged (0 -100%). The score is based on dignosis, age, lab data, medications, orders, and past utilization.   Low:  0-14.9   Medium: 15-21.9   High: 22-29.9   Extreme: 30 and above         Admitted From: Home Disposition: Home  Recommendations for Outpatient Follow-up:  1. Follow up with PCP in 1-2 weeks 2. Please obtain BMP/CBC in one week 3. Please follow up with your PCP on the following pending results:    Unresulted Labs (From admission, onward)         None        Home Health: Yes Equipment/Devices: None  Discharge Condition: Stable CODE STATUS: Partial code Diet recommendation: Cardiac  Subjective: Seen and examined.  Feels much better.  Down to 3 L of oxygen which is very close to his baseline.  Ready to go home.  Brief/Interim Summary: 70 year old male past medical history for anxiety, coronary artery disease, COPD, dyslipidemia, GERD, history of CVA, alcohol abuse, atrial fibrillation and COVID-19 infection 06/2020 presented with 2 days of dyspnea, slowly progressing and worsening,  associated with palpitations and increased sputum production. His symptoms were refractory to increased flow of his home oxygen.   Upon arrival to ED, on his initial physical examination his systolic blood pressure was 200, repeat 141/76, heart rate 99, respiratory rate 15, oxygen saturation 96% on supplemental oxygen.  high sensitive troponin 333-272, arterial blood gas pH 7.39, PCO2 48.5, PO2 88, bicarb 31, oxygen saturation 97%. White count 18.3, hemoglobin 16.4, hematocrit 52.3, platelets 325. SARS COVID-19 negative. Chest radiograph with increased interstitial vascular infiltrates at bases bilaterally, homogeneous.CT chest with emphysematous changes, positive fluid in left fissure, bilateral atelectasis at bases. No pulmonary embolism.  Patient was admitted under hospital service with acute on chronic hypoxic respiratory failure secondary to acute COPD exacerbation.  He was started on steroids, bronchodilators as well as azithromycin.  Over the course of last few days, patient has improved significantly.  He uses 2 to 2.5 L oxygen at home.  Currently he is requiring 3 L.  Patient was evaluated by PT OT who recommended SNF.  Patient refused offers for SNF and he instead wants to go home with home health which has been ordered for him.  Now that he has improved with no symptoms and back to his baseline oxygen requirement, he is being discharged in stable condition.  We will prescribe him few more days of oral prednisone.  Discharge Diagnoses:  Principal Problem:  COPD exacerbation (HCC) Active Problems:   CAD (coronary artery disease)   GERD (gastroesophageal reflux disease)   Heavy alcohol use   Dyslipidemia   S/P coronary artery stent placement   Transient atrial fibrillation (HCC)   Anxiety   History of CVA with residual deficit   Acute on chronic respiratory failure with hypoxia ([place])   Demand ischemia ([place])   Hypoxemia    Patient states that his breathing is back to his  baseline.  He states that he is using between 2 and 3 L at home.  He denies any chest pain.  He denies any palpitations.  He denies any elevated heart rates.  He states that he is using his albuterol 2 times a day perhaps 3 times a day.  However there is definite confusion regarding his maintenance medicine.  Discharge summary lists Breztri.  He mentions that he has Symbicort at home.  However he is using Trelegy today.  There is uncertainty when I repeatedly asked him what he does on a daily basis.  From what I gather from our conversation, he essentially uses these medications only when he gets samples and then he stops them.  Therefore at the present time he is on Trelegy 1 inhalation a day however he only has 1 inhaler at home.  He states that he cannot afford any of these inhalers.  He denies any smoking.  I asked him if he needed something for nausea.  He states that he has not thrown up.  He just felt a little queasy this afternoon and did not feel like coming in.  He denies any abdominal pain.  He denies any diarrhea.  He denies any leg swelling.  His significant other then joined the conversation stating that he needed something to help him sleep and that he could not sleep despite taking lorazepam at night.  I explained that I am hesitant to add any sedating medications to a patient already on lorazepam and requiring oxygen especially having not met him personally or examined him.  Therefore I declined to add any additional sleeping medication at this time.  She then states that he is only taking his blood thinner once a day because he wakes up too late in the morning.  Therefore he only takes it once a day.  I was very adamant that he should be taking the Eliquis twice a day for stroke prevention.  His medicine list then included Celebrex and he admits to taking Celebrex every day.  His past medical history includes a history of myocardial infarction with stents, peptic ulcer disease, alcohol abuse, and  he is on chronic anticoagulation with Eliquis.  Therefore I emphatically recommended that he stop Celebrex due to potential risk of bleeding despite being a Cox 2 specific inhibitor. Past Medical History:  Diagnosis Date  . COPD (chronic obstructive pulmonary disease) (HCC)    not on home O2  . DJD (degenerative joint disease)    diffusely  . GERD (gastroesophageal reflux disease)   . Hyperlipidemia   . MI (myocardial infarction) ([place]) 2006   small MI and stents x 3  Dr. Doreatha Lew  . PUD (peptic ulcer disease)    with bleeding  . Rotator cuff disorder    has been evaluated by Dr Clifton James and Koleen Distance   Past Surgical History:  Procedure Laterality Date  . COLONOSCOPY    . EYE SURGERY     right eye removed at age 23  . heart stent  x3- 2006   Current Outpatient Medications on File Prior to Visit  Medication Sig Dispense Refill  . albuterol (VENTOLIN HFA) 108 (90 Base) MCG/ACT inhaler INHALE 2 PUFFS INTO THE LUNGS EVERY 4 HOURS AS NEEDED FOR WHEEZING AND SHORTNESS OF BREATH 18 g 0  . apixaban (ELIQUIS) 5 MG TABS tablet Take 1 tablet (5 mg total) by mouth 2 (two) times daily. 60 tablet 0  . aspirin 81 MG tablet Take 81 mg by mouth at bedtime.    Marland Kitchen atorvastatin (LIPITOR) 40 MG tablet Take 1 tablet (40 mg total) by mouth at bedtime. 90 tablet 3  . Cyanocobalamin (VITAMIN B-12 PO) Take 1 tablet by mouth daily.    . fluticasone (FLONASE) 50 MCG/ACT nasal spray USE 2 SPRAYS in EACH NOSTRIL EVERY DAY (Patient taking differently: Place 2 sprays into both nostrils daily.) 16 g 6  . folic acid (FOLVITE) 1 MG tablet TAKE 1 TABLET BY MOUTH EVERY DAY. make appt WITH new pcp 30 tablet 0  . LORazepam (ATIVAN) 0.5 MG tablet Take 1 tablet (0.5 mg total) by mouth 2 (two) times daily as needed for anxiety. 180 tablet 0  . Multiple Vitamin (MULTIVITAMIN WITH MINERALS) TABS tablet Take 1 tablet by mouth daily. 90 tablet 0  . Niacin (VITAMIN B-3 PO) Take 1 tablet by mouth daily.    Marland Kitchen omeprazole  (PRILOSEC) 40 MG capsule Take 1 capsule (40 mg total) by mouth daily. 90 capsule 3  . traMADol (ULTRAM) 50 MG tablet TAKE 1 TABLET BY MOUTH EVERY 6 HOURS AS NEEDED FOR moderate PAIN (Patient taking differently: Take 50 mg by mouth every 6 (six) hours as needed for moderate pain.) 30 tablet 0  . zinc gluconate 50 MG tablet Take 50 mg by mouth daily.    Marland Kitchen albuterol (PROVENTIL) (2.5 MG/3ML) 0.083% nebulizer solution Take 3 mLs (2.5 mg total) by nebulization every 4 (four) hours as needed for wheezing or shortness of breath. 180 mL 4  . Budeson-Glycopyrrol-Formoterol (BREZTRI AEROSPHERE) 160-9-4.8 MCG/ACT AERO Inhale 2 puffs into the lungs 2 (two) times daily. (Patient not taking: Reported on 12/27/2020) 10.7 g 6  . nitroGLYCERIN (NITROSTAT) 0.4 MG SL tablet dissolve 1 TABLET UNDER THE TONGUE every 5 minutes AS NEEDED CHEST PAIN (Patient not taking: Reported on 12/27/2020) 25 tablet 0   No current facility-administered medications on file prior to visit.   No Known Allergies Social History   Socioeconomic History  . Marital status: Single    Spouse name: Not on file  . Number of children: Not on file  . Years of education: Not on file  . Highest education level: Not on file  Occupational History  . Occupation: retired  Tobacco Use  . Smoking status: Former Smoker    Packs/day: 1.00    Years: 40.00    Pack years: 40.00    Types: Cigarettes  . Smokeless tobacco: Never Used  . Tobacco comment: declines patch  Vaping Use  . Vaping Use: Never used  Substance and Sexual Activity  . Alcohol use: Yes    Alcohol/week: 30.0 standard drinks    Types: 30 Cans of beer per week    Comment: h/o heavy use, now 4 beers per day  . Drug use: Not Currently  . Sexual activity: Yes  Other Topics Concern  . Not on file  Social History Narrative   Right Handed   Lives in one story home       Patient has not smoked for 3 months   Patient  on 2L of Oxygen    Social Determinants of Health   Financial  Resource Strain: Not on file  Food Insecurity: Not on file  Transportation Needs: Not on file  Physical Activity: Not on file  Stress: Not on file  Social Connections: Not on file  Intimate Partner Violence: Not on file      Review of Systems  All other systems reviewed and are negative.      Objective:   Physical Exam        Assessment & Plan:  Chronic obstructive pulmonary disease, unspecified COPD type ([place])  COPD exacerbation ([place])  History of CVA with residual deficit  Transient atrial fibrillation ([place])  Having only met the patient today over the telephone, what I gather from our conversation, his biggest risk for hospital readmission is due to medication noncompliance.  I feel that some of this is due to cost.  He is unable to afford his maintenance medication.  Therefore I have contacted our onsite pharmacist to arrange a follow-up with the patient to see if he can get him applied for any financial assistance programs.  I do believe the patient would benefit from taking daily triple therapy whether it be Breztri or Trelegy.  I emphasized to the patient that the best way to stay out of the hospital is to consistently take this medication and avoid smoking.  Second I emphasized to the patient that he needs to take Eliquis twice a day.  Third I recommended he stop Celebrex due to the potential risk of bleeding given his past medical history and medical comorbidities.  I declined to add any additional sleeping medication at this time.  However per the patient's report he is back to his baseline.  I would like to see the patient in the office when he feels better to perform a thorough physical exam

## 2020-12-31 ENCOUNTER — Telehealth: Payer: Self-pay | Admitting: *Deleted

## 2020-12-31 NOTE — Chronic Care Management (AMB) (Signed)
  Chronic Care Management   Note  12/31/2020 Name: Zachary Chambers MRN: [id number] DOB: September 14, 1950  Zachary Chambers is a 70 y.o. year old male who is a primary care patient of Pickard, Cammie Mcgee, MD. I reached out to Janeann Merl by phone today in response to a referral sent by Zachary Chambers's PCP, Dr. Dennard Schaumann.     [name] was given information about Chronic Care Management services today including:  1. CCM service includes personalized support from designated clinical staff supervised by his physician, including individualized plan of care and coordination with other care providers 2. 24/7 contact phone numbers for assistance for urgent and routine care needs. 3. Service will only be billed when office clinical staff spend 20 minutes or more in a month to coordinate care. 4. Only one practitioner may furnish and bill the service in a calendar month. 5. The patient may stop CCM services at any time (effective at the end of the month) by phone call to the office staff. 6. The patient will be responsible for cost sharing (co-pay) of up to 20% of the service fee (after annual deductible is met).  Patient agreed to services and verbal consent obtained.   Follow up plan: Telephone appointment with care management team member scheduled HKI:SNGX on 01/06/2021 and SW 01/09/2021, and PharmD 02/14/2021  [place] [facility]

## 2021-01-06 ENCOUNTER — Telehealth: Payer: Self-pay | Admitting: *Deleted

## 2021-01-06 ENCOUNTER — Telehealth: Payer: Medicare HMO

## 2021-01-06 NOTE — Telephone Encounter (Signed)
  Care Management   Follow Up Note   01/06/2021 Name: Zachary Chambers MRN: [id number] DOB: [birth year]   Referred by: [name], MD Reason for referral : Chronic Care Management (COPD, CAD, CVA, A-Fib)   An unsuccessful telephone outreach was attempted today. The patient was referred to the case management team for assistance with care management and care coordination.   Follow Up Plan: Telephone follow up appointment with care management team member scheduled for:  [facility]guide will contact patient to reschedule.  Jacqlyn Larsen RNC, BSN RN Case Manager [facility] [phone]   [facility]

## 2021-01-07 ENCOUNTER — Telehealth: Payer: Self-pay | Admitting: *Deleted

## 2021-01-07 NOTE — Chronic Care Management (AMB) (Signed)
  Care Management   Note  01/07/2021 Name: Zachary Chambers MRN: [id number] DOB: 15-Jul-1951  [name] is a 70 y.o. year old male who is a primary care patient of Dennard Schaumann, Cammie Mcgee, MD and is actively engaged with the care management team. I reached out to Janeann Merl by phone today to assist with re-scheduling an initial visit with the RN Case Manager  Follow up plan: Unsuccessful telephone outreach attempt made. A HIPAA compliant phone message was left for the patient providing contact information and requesting a return call.  The care management team will reach out to the patient again over the next 7 days.  If patient returns call to provider office, please advise to call [place] at [phone].  [place] Management

## 2021-01-08 NOTE — Chronic Care Management (AMB) (Signed)
  Care Management   Note  01/08/2021 Name: Zachary Chambers MRN: [id number] DOB: September 25, 1950  [name] is a 70 y.o. year old male who is a primary care patient of Dennard Schaumann, Cammie Mcgee, MD and is actively engaged with the care management team. I reached out to Janeann Merl by phone today to assist with re-scheduling an initial visit with the RN Case Manager.  Follow up plan: Telephone appointment with care management team member scheduled for:01/17/2021  [place] Management

## 2021-01-09 ENCOUNTER — Ambulatory Visit (INDEPENDENT_AMBULATORY_CARE_PROVIDER_SITE_OTHER): Payer: Medicare HMO | Admitting: *Deleted

## 2021-01-09 DIAGNOSIS — I25110 Atherosclerotic heart disease of native coronary artery with unstable angina pectoris: Secondary | ICD-10-CM | POA: Diagnosis not present

## 2021-01-09 DIAGNOSIS — U071 COVID-19: Secondary | ICD-10-CM | POA: Diagnosis not present

## 2021-01-09 DIAGNOSIS — J1282 Pneumonia due to coronavirus disease 2019: Secondary | ICD-10-CM | POA: Diagnosis not present

## 2021-01-09 DIAGNOSIS — J449 Chronic obstructive pulmonary disease, unspecified: Secondary | ICD-10-CM

## 2021-01-09 DIAGNOSIS — E43 Unspecified severe protein-calorie malnutrition: Secondary | ICD-10-CM

## 2021-01-09 DIAGNOSIS — I639 Cerebral infarction, unspecified: Secondary | ICD-10-CM

## 2021-01-09 DIAGNOSIS — I4891 Unspecified atrial fibrillation: Secondary | ICD-10-CM | POA: Diagnosis not present

## 2021-01-09 DIAGNOSIS — J9621 Acute and chronic respiratory failure with hypoxia: Secondary | ICD-10-CM | POA: Diagnosis not present

## 2021-01-09 DIAGNOSIS — F419 Anxiety disorder, unspecified: Secondary | ICD-10-CM

## 2021-01-09 DIAGNOSIS — R0600 Dyspnea, unspecified: Secondary | ICD-10-CM | POA: Diagnosis not present

## 2021-01-12 DIAGNOSIS — J449 Chronic obstructive pulmonary disease, unspecified: Secondary | ICD-10-CM | POA: Diagnosis not present

## 2021-01-13 DIAGNOSIS — J449 Chronic obstructive pulmonary disease, unspecified: Secondary | ICD-10-CM | POA: Diagnosis not present

## 2021-01-13 DIAGNOSIS — M6281 Muscle weakness (generalized): Secondary | ICD-10-CM | POA: Diagnosis not present

## 2021-01-14 NOTE — Patient Instructions (Signed)
Visit Information  PATIENT GOALS: Goals Addressed              This Visit's Progress   .  Find Help in My Community to maximize pt's independence and overal health and QOL (pt-stated)        Timeframe:  Long-Range Goal Priority:  High Start Date:    01/09/2021                         Expected End Date:         03/15/2021              Follow Up Date 01/23/2021   -expect call from Nurse Case Manager and [place]- and/or return their calls - begin a notebook of services in my neighborhood or community - call 211 when I need some help - follow-up on any referrals for help I am given - think ahead to make sure my need does not become an emergency - make a list of family or friends that I can call    Why is this important?    Knowing how and where to find help for yourself or family in your neighborhood and community is an important skill.   You will want to take some steps to learn how.    Notes:        The patient verbalized understanding of instructions, educational materials, and care plan provided today and declined offer to receive copy of patient instructions, educational materials, and care plan.   Telephone follow up appointment with care management team member scheduled for:01/23/2021  Eduard Clos MSW, [place] Licensed Clinical Social Worker [facility] [phone]

## 2021-01-14 NOTE — Chronic Care Management (AMB) (Signed)
Chronic Care Management    Clinical Social Work Note  01/14/2021 Name: Zachary Chambers MRN: [id number] DOB: [birth year]  Zachary Chambers is a 70 y.o. year old male who is a primary care patient of Pickard, Cammie Mcgee, MD. The CCM team was consulted to assist the patient with chronic disease management and/or care coordination needs related to: [employer] , Food Insecurity and Caregiver Stress.   Engaged with patient by telephone for initial visit in response to provider referral for social work chronic care management and care coordination services.   Consent to Services:  The patient was given information about Chronic Care Management services, agreed to services, and gave verbal consent prior to initiation of services.  Please see initial visit note for detailed documentation.   Patient agreed to services and consent obtained.   Assessment: Review of patient past medical history, allergies, medications, and health status, including review of relevant consultants reports was performed today as part of a comprehensive evaluation and provision of chronic care management and care coordination services.     SDOH (Social Determinants of Health) assessments and interventions performed:    Advanced Directives Status: Not addressed in this encounter.  CCM Care Plan  No Known Allergies  Outpatient Encounter Medications as of 01/09/2021  Medication Sig Note  . albuterol (PROVENTIL) (2.5 MG/3ML) 0.083% nebulizer solution Take 3 mLs (2.5 mg total) by nebulization every 4 (four) hours as needed for wheezing or shortness of breath.   Marland Kitchen albuterol (VENTOLIN HFA) 108 (90 Base) MCG/ACT inhaler INHALE 2 PUFFS INTO THE LUNGS EVERY 4 HOURS AS NEEDED FOR WHEEZING AND SHORTNESS OF BREATH   . apixaban (ELIQUIS) 5 MG TABS tablet Take 1 tablet (5 mg total) by mouth 2 (two) times daily.   Marland Kitchen aspirin 81 MG tablet Take 81 mg by mouth at bedtime.   Marland Kitchen atorvastatin (LIPITOR) 40 MG tablet Take 1 tablet (40 mg total) by  mouth at bedtime. 12/11/2020: Lf 4.3.22 #90/90  . Budeson-Glycopyrrol-Formoterol (BREZTRI AEROSPHERE) 160-9-4.8 MCG/ACT AERO Inhale 2 puffs into the lungs 2 (two) times daily. (Patient not taking: Reported on 12/27/2020)   . Cyanocobalamin (VITAMIN B-12 PO) Take 1 tablet by mouth daily.   . fluticasone (FLONASE) 50 MCG/ACT nasal spray USE 2 SPRAYS in EACH NOSTRIL EVERY DAY (Patient taking differently: Place 2 sprays into both nostrils daily.) 12/11/2020: LF 4.22.22 #1/30  . folic acid (FOLVITE) 1 MG tablet TAKE 1 TABLET BY MOUTH EVERY DAY. make appt WITH new pcp   . LORazepam (ATIVAN) 0.5 MG tablet Take 1 tablet (0.5 mg total) by mouth 2 (two) times daily as needed for anxiety. 12/11/2020: LF 3.1.22 #60/30  . Multiple Vitamin (MULTIVITAMIN WITH MINERALS) TABS tablet Take 1 tablet by mouth daily.   . Niacin (VITAMIN B-3 PO) Take 1 tablet by mouth daily.   . nitroGLYCERIN (NITROSTAT) 0.4 MG SL tablet dissolve 1 TABLET UNDER THE TONGUE every 5 minutes AS NEEDED CHEST PAIN (Patient not taking: Reported on 12/27/2020) 12/11/2020: LF 1.2.22 #25  . omeprazole (PRILOSEC) 40 MG capsule Take 1 capsule (40 mg total) by mouth daily. 12/11/2020: LF 4.3.22 #90/90  . traMADol (ULTRAM) 50 MG tablet TAKE 1 TABLET BY MOUTH EVERY 6 HOURS AS NEEDED FOR moderate PAIN (Patient taking differently: Take 50 mg by mouth every 6 (six) hours as needed for moderate pain.)   . zinc gluconate 50 MG tablet Take 50 mg by mouth daily.    No facility-administered encounter medications on file as of 01/09/2021.    Patient  Active Problem List   Diagnosis Date Noted  . Hypoxemia 12/10/2020  . COPD exacerbation ([place]) 12/09/2020  . Acute on chronic respiratory failure with hypoxia ([place]) 12/09/2020  . Demand ischemia ([place]) 12/09/2020  . Anxiety 10/07/2020  . History of CVA with residual deficit 10/07/2020  . Pneumonia due to COVID-19 virus 07/05/2020  . Chronic respiratory failure ([place]) 07/05/2020  . Transient atrial fibrillation ([place])  07/05/2020  . Dyspnea 07/04/2020  . Protein-calorie malnutrition, severe 03/07/2020  . S/P coronary artery stent placement   . Chest pain 03/06/2020  . Dyslipidemia 03/06/2020  . Healthcare maintenance 03/05/2020  . Heavy alcohol use 03/04/2018  . Memory changes 03/04/2018  . Leukopenia 05/04/2017  . Bruit 01/22/2014  . Muscle spasm of back 09/20/2013  . COPD (chronic obstructive pulmonary disease) ([place]) 01/14/2011  . CAD (coronary artery disease) 01/14/2011  . GERD (gastroesophageal reflux disease) 01/14/2011  . TOBACCO ABUSE 07/25/2009  . Osteoarthritis 07/25/2009  . ROTATOR CUFF SYNDROME, RIGHT 07/25/2009    Conditions to be addressed/monitored: Atrial Fibrillation, COPD and Anxiety; Medication procurement, Lacks knowledge of community resource: caregiver stress,  patient frustrated with limitations of being homebound and *wanting to his "oxygen tank portable"  Care Plan : LCSW Plan of Care  Updates made by Deirdre Peer, LCSW since 01/14/2021 12:00 AM    Problem: Limited Mobility and Independence due to physical and breathing barriers   Priority: High    Long-Range Goal: Link pt with resources to assist with removal of barriers limiting his ability/independence   Start Date: 01/09/2021  Expected End Date: 03/15/2021  This Visit's Progress: On track  Priority: High  Note:   Current Barriers:   Ineffective Self Health Maintenance   Unable to independently manage self care  Does not adhere to provider recommendations re: home health (pt declined due to home conditions-clutter)  Oxygen tank limitations- per family, has to remain plugged in and limits him to not being able to get outside or go away for long (portable tanks have been unreliable per their report)  Clinical Goal(s):  Marland Kitchen Collaboration with [name], MD regarding development and update of comprehensive plan of care as evidenced by provider attestation and co-signature . Inter-disciplinary care team  collaboration (see longitudinal plan of care)  patient will work with care management team to address care coordination and chronic disease management needs related to Care Coordination  Psychosocial Support  Caregiver Stress support  Other community resources     Interventions:  CSW made initial contact with pt and his significant other/housemate/caregiver- [name].  CSW advised pt reason for referral; Caregiver Stress Counseling. Per pt and Lelon Frohlich, pt is at home after being hospitalized and released to home. After review of pt's hospital stay and recommendation for SNF; CSW inquired with pt as to why he did not go to SNF as well as why he did not have [place] care (PT/OT); per pt and Lelon Frohlich; they chose to go home and declined [facility] because of the "condition of the home"- clutter and no room for the therapy, etc. [name], pt's caregiver, is wanting to know if she can get paid to provide care to pt- CSW assessed pt's finances and advised that he is over the limit to apply for Medicaid and thus would not be eligible for the Medicaid programs that provide in home care.  Pt is not a Veteran and does not have a [place] has advised pt and Lelon Frohlich they can consider private pay for caregivers  if they desire.   Pt voiced a strong desire to be able to get outside, ride his riding lawn mower and not be stuck inside with the oxygen tank- per pt the tank has to be plugged in.  Furthermore, pt is in need of regaining his mobility/strength and needs therapy-  CSW discussed possible outpatient therapy if he is able to go by car or to clear a room out to where therapy can be done with HHPT/OT.  CSW attempted to guide them to make priorities in order to see the outcomes and results they want- for pt to be more independent, able to get out, ride his mower, etc and for [name] to have less caregiving responsibilities, pt needs to be able to receive PT and OT in some setting.  CSW will discuss this further with RNCM as  well as the Oxygen/DME issue.     Evaluation of current treatment plan related to COPD and Anxiety, Housing barriers, Social Isolation, Inability to perform ADL's independently, Lacks knowledge of community resource: oxygen tank issues and need for physical therapy/occupational therapy, and self-management and patient's adherence to plan as established by provider.  Collaboration with [name], MD regarding development and update of comprehensive plan of care as evidenced by provider attestation       and co-signature  Inter-disciplinary care team collaboration (see longitudinal plan of care)  Discussed plans with patient for ongoing care management follow up and provided patient with direct contact information for care management team  Patient Goals: -expect call from Nurse Case Manager and [place]- and/or return their calls - begin a notebook of services in my neighborhood or community - call 211 when I need some help - follow-up on any referrals for help I am given - think ahead to make sure my need does not become an emergency - make a list of family or friends that I can call     Follow Up Plan: Telephone follow up appointment with care management team member scheduled for:  01/23/2021      Follow Up Plan: SW will follow up with patient by phone over the next 2 weeks      Eduard Clos MSW, [place] Licensed Clinical Social Worker [place] [phone]

## 2021-01-17 ENCOUNTER — Telehealth: Payer: Self-pay | Admitting: Family Medicine

## 2021-01-17 ENCOUNTER — Ambulatory Visit (INDEPENDENT_AMBULATORY_CARE_PROVIDER_SITE_OTHER): Payer: Medicare HMO | Admitting: *Deleted

## 2021-01-17 DIAGNOSIS — I25110 Atherosclerotic heart disease of native coronary artery with unstable angina pectoris: Secondary | ICD-10-CM

## 2021-01-17 DIAGNOSIS — J449 Chronic obstructive pulmonary disease, unspecified: Secondary | ICD-10-CM

## 2021-01-17 DIAGNOSIS — J441 Chronic obstructive pulmonary disease with (acute) exacerbation: Secondary | ICD-10-CM | POA: Diagnosis not present

## 2021-01-17 NOTE — Patient Instructions (Signed)
Visit Information   PATIENT GOALS:  Goals Addressed            This Visit's Progress   . Improve My Heart Health-Coronary Artery Disease       Timeframe:  Long-Range Goal Priority:  High Start Date:    01/17/2021                        Expected End Date:     07/19/2021                  Follow Up Date 02/14/2021   - be open to making changes such as doing light exercise and walking short distances - know and watch for signs of a heart attack - if I have chest pain, use nitroglycerin and call for help - take all medications as prescribed - learn about small changes that will make a big difference such as improvement in diet - choose fresh/ frozen fruits and vegetables, lean meats - follow up with primary care doctor on 02/04/21   Why is this important?    Lifestyle changes are key to improving the blood flow to your heart. Think about the things you can change and set a goal to live healthy.   Remember, when the blood vessels to your heart start to get clogged you may not have any symptoms.   Over time, they can get worse.   Don't ignore the signs, like chest pain, and get help right away.     Notes:     . Track and Manage My Symptoms-COPD       Timeframe:  Long Range Goal Priority:  High Start Date:   01/17/2021                          Expected End Date:        07/19/2021               Follow Up Date 02/14/2021   - develop a COPD rescue / action plan - eliminate symptom triggers at home - follow rescue plan if symptoms flare-up - read over action plan mailed to you - keep follow-up appointments  - use inhalers and oxygen as prescribed - consider pulmonary rehab   Why is this important?    Tracking your symptoms and other information about your health helps your doctor plan your care.   Write down the symptoms, the time of day, what you were doing and what medicine you are taking.   You will soon learn how to manage your symptoms.     Notes:        Consent to CCM  Services: Mr. Ahlgren was given information about Chronic Care Management services today including:  1. CCM service includes personalized support from designated clinical staff supervised by his physician, including individualized plan of care and coordination with other care providers 2. 24/7 contact phone numbers for assistance for urgent and routine care needs. 3. Service will only be billed when office clinical staff spend 20 minutes or more in a month to coordinate care. 4. Only one practitioner may furnish and bill the service in a calendar month. 5. The patient may stop CCM services at any time (effective at the end of the month) by phone call to the office staff. 6. The patient will be responsible for cost sharing (co-pay) of up to 20% of the service fee (after annual deductible is met).  Patient agreed to services and verbal consent obtained.   The patient verbalized understanding of instructions, educational materials, and care plan provided today and agreed to receive a mailed copy of patient instructions, educational materials, and care plan.   Telephone follow up appointment with care management team member scheduled for:  02/14/2021  Jacqlyn Larsen [facility], BSN RN Case Manager [facility] [phone]   CLINICAL CARE PLAN:               Patient Care Plan: COPD (Adult)    Problem Identified: Symptom Exacerbation (COPD)     Long-Range Goal: Symptom Exacerbation Prevented or Minimized   Start Date: 01/17/2021  Expected End Date: 07/19/2021  This Visit's Progress: On track  Priority: High  Note:   Current Barriers:  Marland Kitchen Knowledge deficits related to basic understanding of COPD disease process- can benefit from teaching/ reinforcement COPD action plan, management of anxiety, has ativan and uses as prescribed. Pt reports he does not do very well with pursed lip breathing.  Pt reports he is not smoking. . Knowledge deficit related to importance of energy  conservation- caregiver would like patient to get up and walk, move around more. . Unable to self administer medications as prescribed- caregiver provides oversight with medications . Does not adhere to provider recommendations- patient refused SNF and home health PT/ OT, reports there is clutter in the home that prohibits home health services. . Unable to perform ADLs independently- has DME and caregiver assists as needed, pt is able to fix food in the kitchen, able to bathe and ambulate, is able to drive but does not, caregiver transports patient to appointments, errands, etc. . Does not contact provider office for questions/concerns - pt wants portable oxygen tank and per notes, pulmonologist reports pt is not candidate for this type of oxygen as this is inadequate for patient's needs, pt needs continuous flow oxygen with goal of 02 saturation 88-92%.  Pt needs reinforcement for COPD action plan if in yellow zone to call MD. . Patient is working with [employer] for financial constraints, anxiety.  Care guide provided resources. Case Manager Clinical Goal(s):  patient will report using inhalers as prescribed including rinsing mouth after use  patient will report utilizing pursed lip breathing for shortness of breath  patient will verbalize understanding of COPD action plan and when to seek appropriate levels of medical care  patient will engage in lite exercise as tolerated to build/regain stamina and strength and reduce shortness of breath through activity tolerance  patient will verbalize basic understanding of COPD disease process and self care activities  Interventions:  . Collaboration with [name], MD regarding development and update of comprehensive plan of care as evidenced by provider attestation and co-signature . Inter-disciplinary care team collaboration (see longitudinal plan of care)  Provided patient with verbal COPD education on self care/management/and exacerbation  prevention   Provided patient with COPD action plan and reinforced importance of daily self assessment- COPD action plan mailed to patient  Provided verbal instructions on pursed lip breathing  Advised patient to self assesses COPD action plan zone and make appointment with provider if in the yellow zone for 48 hours without improvement.  Advised patient to engage in light exercise as tolerated 3-5 days a week  Reviewed importance of using oxygen as prescribed, ask patient to talk with pulmonologist about any further options for oxygen as pt is not candidate for portable oxygen.  Reviewed medications with patient and importance of using inhalers as  prescribed  Reinforced symptom management and calling MD early on for changes in health status/ symptoms  Reviewed importance of utilizing home health PT/ OT and ridding home of clutter. Self-Care Activities:  Attends all scheduled provider appointments Attends church or other social activities Calls provider office for new concerns or questions Patient Goals: - eat healthy - get outdoors every day (weather permitting) - practice relaxation or meditation daily - identify and remove indoor air pollutants - develop a COPD rescue / action plan - eliminate symptom triggers at home - follow rescue plan if symptoms flare-up - read over action plan mailed to you - keep follow-up appointments  - use inhalers and oxygen as prescribed - consider pulmonary rehab Follow Up Plan: Telephone follow up appointment with care management team member scheduled for:  02/14/2021    Patient Care Plan: Coronary Artery Disease    Problem Identified: Health Promotion or Disease Self-Management for CAD (General Plan of Care)   Priority: High    Long-Range Goal: Self-Management Plan Developed for management of CAD   Start Date: 01/17/2021  Expected End Date: 07/19/2021  This Visit's Progress: On track  Priority: High  Note:   Current Barriers:   Ineffective  Self Health Maintenance - patient needs reinforcement for management of CAD, diet  Unable to self administer medications as prescribed- significant other (caregiver) assists with and provides oversight of medications  Does not adhere to provider recommendations re: exercise, home health PT/ OT was ordered but patient declined due to clutter in the home that he and caregiver plan on removing, not interested in outpatient PT/ OT at present, attended previously and felt it was not helpful.  Unable to perform ADLs independently-  has DME and uses as needed, WC, walker, cane, shower seat.  Pt reports he is walking currently without using DME but has on hand in case he needs, pt reports barrier to ambulating far distances is dyspnea. Clinical Goal(s):  Marland Kitchen Collaboration with [name], MD regarding development and update of comprehensive plan of care as evidenced by provider attestation and co-signature . Inter-disciplinary care team collaboration (see longitudinal plan of care)  patient will work with care management team to address care coordination and chronic disease management needs related to Disease Management  Educational Needs  Medication Management and Education  Psychosocial Support   Interventions:   Evaluation of current treatment plan related to coronary artery disease.  Self-management and patient's adherence to plan as established by provider.  Collaboration with [name], MD regarding development and update of comprehensive plan of care as evidenced by provider attestation       and co-signature  Inter-disciplinary care team collaboration (see longitudinal plan of care)  Discussed plans with patient for ongoing care management follow up and provided patient with direct contact information for care management team  Reviewed medications with patient and importance of taking as prescribed  Reviewed signs/ symptoms MI and CVA and actions to take  Reviewed  importance of using nitroglycerin as prescribed and call 911 for unrelieved chest pain  Encouraged patient (and caregiver) to try light activity and some walking daily Self Care Activities:  . Attends all scheduled provider appointments . Attends church or other social activities . Calls provider office for new concerns or questions Patient Goals: - be open to making changes such as doing light exercise and walking short distances - know and watch for signs of a heart attack - if I have chest pain, use nitroglycerin and call for  help - take all medications as prescribed - learn about small changes that will make a big difference such as improvement in diet - choose fresh/ frozen fruits and vegetables, lean meats - follow up with primary care doctor on 02/04/21 Follow Up Plan: Telephone follow up appointment with care management team member scheduled for:  02/14/2021       COPD Action Plan A COPD action plan is a description of what to do when you have a flare (exacerbation) of chronic obstructive pulmonary disease (COPD). Your action plan is a color-coded plan that lists the symptoms that indicate whether your condition is under control and what actions to take.  If you have symptoms in the green zone, it means you are doing well that day.  If you have symptoms in the yellow zone, it means you are having a bad day or an exacerbation.  If you have symptoms in the red zone, you need urgent medical care. Follow the plan that you and your health care provider developed. Review your plan with your health care provider at each visit. Red zone Symptoms in this zone mean that you should get medical help right away. They include:  Feeling very short of breath, even when you are resting.  Not being able to do any activities because of poor breathing.  Not being able to sleep because of poor breathing.  Fever or shaking chills.  Feeling confused or very sleepy.  Chest pain.  Coughing up  blood. If you have any of these symptoms, call emergency services (911 in the [place] or go to the nearest emergency room.   Yellow zone Symptoms in this zone mean that your condition may be getting worse. They include:  Feeling more short of breath than usual.  Having less energy for daily activities than usual.  Phlegm or mucus that is thicker than usual.  Needing to use your rescue inhaler or nebulizer more often than usual.  More ankle swelling than usual.  Coughing more than usual.  Feeling like you have a chest cold.  Trouble sleeping due to COPD symptoms.  Decreased appetite.  COPD medicines not helping as much as usual. If you experience any "yellow" symptoms:  Keep taking your daily medicines as directed.  Use your quick-relief inhaler as told by your health care provider.  If you were prescribed steroid medicine to take by mouth (oral medicine), start taking it as told by your health care provider.  If you were prescribed an antibiotic medicine, start taking it as told by your health care provider. Do not stop taking the antibiotic even if you start to feel better.  Use oxygen as told by your health care provider.  Get more rest.  Do your pursed-lip breathing exercises.  Do not smoke. Avoid any irritants in the air. If your signs and symptoms do not improve after taking these steps, call your health care provider right away.   Green zone Symptoms in this zone mean that you are doing well. They include:  Being able to do your usual activities and exercise.  Having the usual amount of coughing, including the same amount of phlegm or mucus.  Being able to sleep well.  Having a good appetite.   Where to find more information: You can find more information about COPD from:  American Lung Association, My COPD Action Plan: www.lung.org  COPD Foundation: www.copdfoundation.[place]: https://wilson-eaton.com/ Follow these  instructions at home:  Continue taking your daily medicines  as told by your health care provider.  Make sure you receive all the immunizations that your health care provider recommends, especially the pneumococcal and influenza vaccines.  Wash your hands often with soap and water. Have family members wash their hands too. Regular hand washing can help prevent infections.  Follow your usual exercise and diet plan.  Avoid irritants in the air, such as smoke.  Do not use any products that contain nicotine or tobacco. These products include cigarettes, chewing tobacco, and vaping devices, such as e-cigarettes. If you need help quitting, ask your health care provider. Summary  A COPD action plan tells you what to do when you have a flare (exacerbation) of chronic obstructive pulmonary disease (COPD).  Follow each action plan for your symptoms. If you have any symptoms in the red zone, call emergency services (911 in the [place] or go to the nearest emergency room. This information is not intended to replace advice given to you by your health care provider. Make sure you discuss any questions you have with your health care provider. Document Revised: 06/11/2020 Document Reviewed: 06/11/2020 Elsevier Patient Education  2021 [employer].

## 2021-01-17 NOTE — Telephone Encounter (Signed)
   Telephone encounter was:  Successful.  01/17/2021 Name: Zachary Chambers MRN: [id number] DOB: [birth year]  Zachary Chambers is a 70 y.o. year old male who is a primary care patient of Pickard, Cammie Mcgee, MD . The community resource team was consulted for assistance with [place] guide performed the following interventions: Patient provided with information about care guide support team and interviewed to confirm resource needs Discussed resources to assist with food resources. Spoke to pt about his food concerns and he said with the cost of food prices going up he can't afford the groceries these days especially the ones that require just heat and eat since he doesn't cook every day. I will mail him a list of food pantries in his area and place him on MOW.  Placed referral to MOW via email/calling. .  Follow Up Plan:  No further follow up planned at this time. The patient has been provided with needed resources.  [place], Care Management Phone: [phone] Email: [email or web address]

## 2021-01-17 NOTE — Chronic Care Management (AMB) (Signed)
Chronic Care Management   CCM RN Visit Note  01/17/2021 Name: Zachary Chambers MRN: [id number] DOB: October 10, 1950  Subjective: Zachary Chambers is a 70 y.o. year old male who is a primary care patient of Pickard, Cammie Mcgee, MD. The care management team was consulted for assistance with disease management and care coordination needs.    Engaged with patient by telephone for initial visit in response to provider referral for case management and/or care coordination services.   Consent to Services:  The patient was given the following information about Chronic Care Management services today, agreed to services, and gave verbal consent: 1. CCM service includes personalized support from designated clinical staff supervised by the primary care provider, including individualized plan of care and coordination with other care providers 2. 24/7 contact phone numbers for assistance for urgent and routine care needs. 3. Service will only be billed when office clinical staff spend 20 minutes or more in a month to coordinate care. 4. Only one practitioner may furnish and bill the service in a calendar month. 5.The patient may stop CCM services at any time (effective at the end of the month) by phone call to the office staff. 6. The patient will be responsible for cost sharing (co-pay) of up to 20% of the service fee (after annual deductible is met). Patient agreed to services and consent obtained.  Patient agreed to services and verbal consent obtained.   Assessment: Review of patient past medical history, allergies, medications, health status, including review of consultants reports, laboratory and other test data, was performed as part of comprehensive evaluation and provision of chronic care management services.   SDOH (Social Determinants of Health) assessments and interventions performed:  SDOH Interventions   Flowsheet Row Most Recent Value  SDOH Interventions   Food Insecurity Interventions Intervention Not  Indicated  Physical Activity Interventions Intervention Not Indicated  Transportation Interventions Intervention Not Indicated       CCM Care Plan  No Known Allergies  Outpatient Encounter Medications as of 01/17/2021  Medication Sig Note  . albuterol (PROVENTIL) (2.5 MG/3ML) 0.083% nebulizer solution Take 3 mLs (2.5 mg total) by nebulization every 4 (four) hours as needed for wheezing or shortness of breath.   Marland Kitchen albuterol (VENTOLIN HFA) 108 (90 Base) MCG/ACT inhaler INHALE 2 PUFFS INTO THE LUNGS EVERY 4 HOURS AS NEEDED FOR WHEEZING AND SHORTNESS OF BREATH   . aspirin 81 MG tablet Take 81 mg by mouth at bedtime.   Marland Kitchen atorvastatin (LIPITOR) 40 MG tablet Take 1 tablet (40 mg total) by mouth at bedtime. 12/11/2020: Lf 4.3.22 #90/90  . Budeson-Glycopyrrol-Formoterol (BREZTRI AEROSPHERE) 160-9-4.8 MCG/ACT AERO Inhale 2 puffs into the lungs 2 (two) times daily.   . Cyanocobalamin (VITAMIN B-12 PO) Take 1 tablet by mouth daily.   . fluticasone (FLONASE) 50 MCG/ACT nasal spray USE 2 SPRAYS in EACH NOSTRIL EVERY DAY (Patient taking differently: Place 2 sprays into both nostrils daily.) 12/11/2020: LF 4.22.22 #1/30  . folic acid (FOLVITE) 1 MG tablet TAKE 1 TABLET BY MOUTH EVERY DAY. make appt WITH new pcp   . LORazepam (ATIVAN) 0.5 MG tablet Take 1 tablet (0.5 mg total) by mouth 2 (two) times daily as needed for anxiety. 12/11/2020: LF 3.1.22 #60/30  . Multiple Vitamin (MULTIVITAMIN WITH MINERALS) TABS tablet Take 1 tablet by mouth daily.   . Niacin (VITAMIN B-3 PO) Take 1 tablet by mouth daily.   . nitroGLYCERIN (NITROSTAT) 0.4 MG SL tablet dissolve 1 TABLET UNDER THE TONGUE every 5 minutes AS  NEEDED CHEST PAIN 12/11/2020: LF 1.2.22 #25  . omeprazole (PRILOSEC) 40 MG capsule Take 1 capsule (40 mg total) by mouth daily. 12/11/2020: LF 4.3.22 #90/90  . zinc gluconate 50 MG tablet Take 50 mg by mouth daily.   Marland Kitchen apixaban (ELIQUIS) 5 MG TABS tablet Take 1 tablet (5 mg total) by mouth 2 (two) times daily.   .  traMADol (ULTRAM) 50 MG tablet TAKE 1 TABLET BY MOUTH EVERY 6 HOURS AS NEEDED FOR moderate PAIN (Patient not taking: Reported on 01/17/2021)    No facility-administered encounter medications on file as of 01/17/2021.    Patient Active Problem List   Diagnosis Date Noted  . Hypoxemia 12/10/2020  . COPD exacerbation ([place]) 12/09/2020  . Acute on chronic respiratory failure with hypoxia ([place]) 12/09/2020  . Demand ischemia ([place]) 12/09/2020  . Anxiety 10/07/2020  . History of CVA with residual deficit 10/07/2020  . Pneumonia due to COVID-19 virus 07/05/2020  . Chronic respiratory failure ([place]) 07/05/2020  . Transient atrial fibrillation ([place]) 07/05/2020  . Dyspnea 07/04/2020  . Protein-calorie malnutrition, severe 03/07/2020  . S/P coronary artery stent placement   . Chest pain 03/06/2020  . Dyslipidemia 03/06/2020  . Healthcare maintenance 03/05/2020  . Heavy alcohol use 03/04/2018  . Memory changes 03/04/2018  . Leukopenia 05/04/2017  . Bruit 01/22/2014  . Muscle spasm of back 09/20/2013  . COPD (chronic obstructive pulmonary disease) ([place]) 01/14/2011  . CAD (coronary artery disease) 01/14/2011  . GERD (gastroesophageal reflux disease) 01/14/2011  . TOBACCO ABUSE 07/25/2009  . Osteoarthritis 07/25/2009  . ROTATOR CUFF SYNDROME, RIGHT 07/25/2009    Conditions to be addressed/monitored:CAD and COPD  Care Plan : COPD (Adult)  Updates made by Kassie Mends, RN since 01/17/2021 12:00 AM    Problem: Symptom Exacerbation (COPD)     Long-Range Goal: Symptom Exacerbation Prevented or Minimized   Start Date: 01/17/2021  Expected End Date: 07/19/2021  This Visit's Progress: On track  Priority: High  Note:   Current Barriers:  Marland Kitchen Knowledge deficits related to basic understanding of COPD disease process- can benefit from teaching/ reinforcement COPD action plan, management of anxiety, has ativan and uses as prescribed. Pt reports he does not do very well with pursed lip breathing.  Pt reports  he is not smoking. . Knowledge deficit related to importance of energy conservation- caregiver would like patient to get up and walk, move around more. . Unable to self administer medications as prescribed- caregiver provides oversight with medications . Does not adhere to provider recommendations- patient refused SNF and home health PT/ OT, reports there is clutter in the home that prohibits home health services. . Unable to perform ADLs independently- has DME and caregiver assists as needed, pt is able to fix food in the kitchen, able to bathe and ambulate, is able to drive but does not, caregiver transports patient to appointments, errands, etc. . Does not contact provider office for questions/concerns - pt wants portable oxygen tank and per notes, pulmonologist reports pt is not candidate for this type of oxygen as this is inadequate for patient's needs, pt needs continuous flow oxygen with goal of 02 saturation 88-92%.  Pt needs reinforcement for COPD action plan if in yellow zone to call MD. . Patient is working with [employer] for financial constraints, anxiety.  Care guide provided resources. Case Manager Clinical Goal(s):  patient will report using inhalers as prescribed including rinsing mouth after use  patient will report utilizing pursed lip breathing for shortness of breath  patient  will verbalize understanding of COPD action plan and when to seek appropriate levels of medical care  patient will engage in lite exercise as tolerated to build/regain stamina and strength and reduce shortness of breath through activity tolerance  patient will verbalize basic understanding of COPD disease process and self care activities  Interventions:  . Collaboration with [name], MD regarding development and update of comprehensive plan of care as evidenced by provider attestation and co-signature . Inter-disciplinary care team collaboration (see longitudinal plan of care)  Provided patient  with verbal COPD education on self care/management/and exacerbation prevention   Provided patient with COPD action plan and reinforced importance of daily self assessment- COPD action plan mailed to patient  Provided verbal instructions on pursed lip breathing  Advised patient to self assesses COPD action plan zone and make appointment with provider if in the yellow zone for 48 hours without improvement.  Advised patient to engage in light exercise as tolerated 3-5 days a week  Reviewed importance of using oxygen as prescribed, ask patient to talk with pulmonologist about any further options for oxygen as pt is not candidate for portable oxygen.  Reviewed medications with patient and importance of using inhalers as prescribed  Reinforced symptom management and calling MD early on for changes in health status/ symptoms  Reviewed importance of utilizing home health PT/ OT and ridding home of clutter. Self-Care Activities:  Attends all scheduled provider appointments Attends church or other social activities Calls provider office for new concerns or questions Patient Goals: - eat healthy - get outdoors every day (weather permitting) - practice relaxation or meditation daily - identify and remove indoor air pollutants - develop a COPD rescue / action plan - eliminate symptom triggers at home - follow rescue plan if symptoms flare-up - read over action plan mailed to you - keep follow-up appointments  - use inhalers and oxygen as prescribed - consider pulmonary rehab Follow Up Plan: Telephone follow up appointment with care management team member scheduled for:  02/14/2021    Care Plan : Coronary Artery Disease  Updates made by Kassie Mends, RN since 01/17/2021 12:00 AM    Problem: Health Promotion or Disease Self-Management for CAD (General Plan of Care)   Priority: High    Long-Range Goal: Self-Management Plan Developed for management of CAD   Start Date: 01/17/2021  Expected End  Date: 07/19/2021  This Visit's Progress: On track  Priority: High  Note:   Current Barriers:   Ineffective Self Health Maintenance - patient needs reinforcement for management of CAD, diet  Unable to self administer medications as prescribed- significant other (caregiver) assists with and provides oversight of medications  Does not adhere to provider recommendations re: exercise, home health PT/ OT was ordered but patient declined due to clutter in the home that he and caregiver plan on removing, not interested in outpatient PT/ OT at present, attended previously and felt it was not helpful.  Unable to perform ADLs independently-  has DME and uses as needed, WC, walker, cane, shower seat.  Pt reports he is walking currently without using DME but has on hand in case he needs, pt reports barrier to ambulating far distances is dyspnea. Clinical Goal(s):  Marland Kitchen Collaboration with [name], MD regarding development and update of comprehensive plan of care as evidenced by provider attestation and co-signature . Inter-disciplinary care team collaboration (see longitudinal plan of care)  patient will work with care management team to address care coordination and chronic disease  management needs related to Disease Management  Educational Needs  Medication Management and Education  Psychosocial Support   Interventions:   Evaluation of current treatment plan related to coronary artery disease.  Self-management and patient's adherence to plan as established by provider.  Collaboration with [name], MD regarding development and update of comprehensive plan of care as evidenced by provider attestation       and co-signature  Inter-disciplinary care team collaboration (see longitudinal plan of care)  Discussed plans with patient for ongoing care management follow up and provided patient with direct contact information for care management team  Reviewed medications with patient  and importance of taking as prescribed  Reviewed signs/ symptoms MI and CVA and actions to take  Reviewed importance of using nitroglycerin as prescribed and call 911 for unrelieved chest pain  Encouraged patient (and caregiver) to try light activity and some walking daily Self Care Activities:  . Attends all scheduled provider appointments . Attends church or other social activities . Calls provider office for new concerns or questions Patient Goals: - be open to making changes such as doing light exercise and walking short distances - know and watch for signs of a heart attack - if I have chest pain, use nitroglycerin and call for help - take all medications as prescribed - learn about small changes that will make a big difference such as improvement in diet - choose fresh/ frozen fruits and vegetables, lean meats - follow up with primary care doctor on 02/04/21 Follow Up Plan: Telephone follow up appointment with care management team member scheduled for:  02/14/2021      Plan:Telephone follow up appointment with care management team member scheduled for:  02/14/2021  Jacqlyn Larsen [facility], BSN RN Case Manager [facility] [phone]

## 2021-01-22 ENCOUNTER — Other Ambulatory Visit: Payer: Self-pay | Admitting: Emergency Medicine

## 2021-01-22 ENCOUNTER — Telehealth: Payer: Medicare HMO

## 2021-01-23 ENCOUNTER — Other Ambulatory Visit: Payer: Self-pay | Admitting: *Deleted

## 2021-01-23 ENCOUNTER — Telehealth: Payer: Medicare HMO

## 2021-01-23 MED ORDER — APIXABAN 5 MG PO TABS: 5.0000 mg | ORAL_TABLET | Freq: Two times a day (BID) | ORAL | 3 refills | Status: DC

## 2021-01-29 ENCOUNTER — Other Ambulatory Visit: Payer: Self-pay | Admitting: *Deleted

## 2021-01-29 MED ORDER — BREZTRI AEROSPHERE 160-9-4.8 MCG/ACT IN AERO
2.0000 | INHALATION_SPRAY | Freq: Two times a day (BID) | RESPIRATORY_TRACT | 3 refills | Status: DC
Start: 2021-01-29 — End: 2021-04-16

## 2021-02-03 ENCOUNTER — Ambulatory Visit: Payer: Medicare HMO | Admitting: *Deleted

## 2021-02-03 ENCOUNTER — Telehealth: Payer: Medicare HMO

## 2021-02-03 ENCOUNTER — Other Ambulatory Visit: Payer: Self-pay | Admitting: Family Medicine

## 2021-02-03 DIAGNOSIS — I6930 Unspecified sequelae of cerebral infarction: Secondary | ICD-10-CM

## 2021-02-03 DIAGNOSIS — J441 Chronic obstructive pulmonary disease with (acute) exacerbation: Secondary | ICD-10-CM

## 2021-02-03 DIAGNOSIS — I25110 Atherosclerotic heart disease of native coronary artery with unstable angina pectoris: Secondary | ICD-10-CM

## 2021-02-03 DIAGNOSIS — R29898 Other symptoms and signs involving the musculoskeletal system: Secondary | ICD-10-CM

## 2021-02-03 DIAGNOSIS — J449 Chronic obstructive pulmonary disease, unspecified: Secondary | ICD-10-CM

## 2021-02-03 DIAGNOSIS — M6281 Muscle weakness (generalized): Secondary | ICD-10-CM

## 2021-02-03 NOTE — Chronic Care Management (AMB) (Signed)
Chronic Care Management    Clinical Social Work Note  02/03/2021 Name: Zachary Chambers MRN: [id number] DOB: 1950-08-21  Zachary Chambers is a 70 y.o. year old male who is a primary care patient of Pickard, Cammie Mcgee, MD. The CCM team was consulted to assist the patient with chronic disease management and/or care coordination needs related to: [employer] , Caregiver Stress, and Financial Difficulties related to cost of RX and limited income .   Engaged with patient by telephone for follow up visit in response to provider referral for social work chronic care management and care coordination services.   Consent to Services:  The patient was given information about Chronic Care Management services, agreed to services, and gave verbal consent prior to initiation of services.  Please see initial visit note for detailed documentation.   Patient agreed to services and consent obtained.   Assessment: Review of patient past medical history, allergies, medications, and health status, including review of relevant consultants reports was performed today as part of a comprehensive evaluation and provision of chronic care management and care coordination services.     SDOH (Social Determinants of Health) assessments and interventions performed:    Advanced Directives Status: Not addressed in this encounter.  CCM Care Plan  No Known Allergies  Outpatient Encounter Medications as of 02/03/2021  Medication Sig Note   albuterol (PROVENTIL) (2.5 MG/3ML) 0.083% nebulizer solution Take 3 mLs (2.5 mg total) by nebulization every 4 (four) hours as needed for wheezing or shortness of breath.    albuterol (VENTOLIN HFA) 108 (90 Base) MCG/ACT inhaler INHALE 2 PUFFS INTO THE LUNGS EVERY 4 HOURS AS NEEDED FOR WHEEZING AND SHORTNESS OF BREATH    apixaban (ELIQUIS) 5 MG TABS tablet Take 1 tablet (5 mg total) by mouth 2 (two) times daily.    aspirin 81 MG tablet Take 81 mg by mouth at bedtime.    atorvastatin  (LIPITOR) 40 MG tablet Take 1 tablet (40 mg total) by mouth at bedtime. 12/11/2020: Lf 4.3.22 #90/90   Budeson-Glycopyrrol-Formoterol (BREZTRI AEROSPHERE) 160-9-4.8 MCG/ACT AERO Inhale 2 puffs into the lungs 2 (two) times daily.    Cyanocobalamin (VITAMIN B-12 PO) Take 1 tablet by mouth daily.    fluticasone (FLONASE) 50 MCG/ACT nasal spray USE 2 SPRAYS in EACH NOSTRIL EVERY DAY (Patient taking differently: Place 2 sprays into both nostrils daily.) 12/11/2020: LF 6.30.16 #0/10   folic acid (FOLVITE) 1 MG tablet TAKE 1 TABLET BY MOUTH EVERY DAY Please make appointment WITH new pcp    LORazepam (ATIVAN) 0.5 MG tablet Take 1 tablet (0.5 mg total) by mouth 2 (two) times daily as needed for anxiety. 12/11/2020: LF 3.1.22 #60/30   Multiple Vitamin (MULTIVITAMIN WITH MINERALS) TABS tablet Take 1 tablet by mouth daily.    Niacin (VITAMIN B-3 PO) Take 1 tablet by mouth daily.    nitroGLYCERIN (NITROSTAT) 0.4 MG SL tablet dissolve 1 TABLET UNDER THE TONGUE every 5 minutes AS NEEDED CHEST PAIN 12/11/2020: LF 1.2.22 #25   omeprazole (PRILOSEC) 40 MG capsule Take 1 capsule (40 mg total) by mouth daily. 12/11/2020: LF 4.3.22 #90/90   traMADol (ULTRAM) 50 MG tablet TAKE 1 TABLET BY MOUTH EVERY 6 HOURS AS NEEDED FOR moderate PAIN (Patient not taking: Reported on 01/17/2021)    zinc gluconate 50 MG tablet Take 50 mg by mouth daily.    No facility-administered encounter medications on file as of 02/03/2021.    Patient Active Problem List   Diagnosis Date Noted   Hypoxemia 12/10/2020  COPD exacerbation ([place]) 12/09/2020   Acute on chronic respiratory failure with hypoxia ([place]) 12/09/2020   Demand ischemia ([place]) 12/09/2020   Anxiety 10/07/2020   History of CVA with residual deficit 10/07/2020   Pneumonia due to COVID-19 virus 07/05/2020   Chronic respiratory failure ([place]) 07/05/2020   Transient atrial fibrillation ([place]) 07/05/2020   Dyspnea 07/04/2020   Protein-calorie malnutrition, severe 03/07/2020   S/P coronary  artery stent placement    Chest pain 03/06/2020   Dyslipidemia 03/06/2020   Healthcare maintenance 03/05/2020   Heavy alcohol use 03/04/2018   Memory changes 03/04/2018   Leukopenia 05/04/2017   Bruit 01/22/2014   Muscle spasm of back 09/20/2013   COPD (chronic obstructive pulmonary disease) ([place]) 01/14/2011   CAD (coronary artery disease) 01/14/2011   GERD (gastroesophageal reflux disease) 01/14/2011   TOBACCO ABUSE 07/25/2009   Osteoarthritis 07/25/2009   ROTATOR CUFF SYNDROME, RIGHT 07/25/2009    Conditions to be addressed/monitored: COPD, DMII, and Pulmonary Disease; Financial constraints related to limited income and Lacks knowledge of community resources  Care Plan : LCSW Plan of Care  Updates made by Deirdre Peer, LCSW since 02/03/2021 12:00 AM     Problem: Limited Mobility and Independence due to physical and breathing barriers   Priority: High     Long-Range Goal: Link pt with resources to assist with removal of barriers limiting his ability/independence   Start Date: 01/09/2021  Expected End Date: 03/15/2021  This Visit's Progress: On track  Recent Progress: On track  Priority: High  Note:   Current Barriers:  Ineffective Self Health Maintenance  Unable to independently manage self care Does not adhere to provider recommendations re: home health (pt declined due to home conditions-clutter) Oxygen tank limitations- per family, has to remain plugged in and limits him to not being able to get outside or go away for long (portable tanks have been unreliable per their report)  Clinical Goal(s):  Collaboration with Zachary Frizzle, MD regarding development and update of comprehensive plan of care as evidenced by provider attestation and co-signature Inter-disciplinary care team collaboration (see longitudinal plan of care) patient will work with care management team to address care coordination and chronic disease management needs related to [place] support Other community resources     Interventions:  CSW made follow up contact with pt and his significant other/housemate/caregiver- Zachary Chambers.  They report they have just gotten the house cleared for Home Health PT to begin. Pt also shared that he is seeing his PCP tomorrow- will ask for [facility] orders to be re-submitted to resume or restart what had been previously ordered. Pt is still not clear as to why he cannot get the portable o2 tank he prefers- CSW encouraged pt to discuss further with PCP.  Pt's caregiver reports things are better and "moving in the right direction". Both patient and caregiver indicate that pt had a $700 bill for medications recently- they state they have not heard from Pharmacist with the [facility] team- CSW will look into this and also make PCP aware.   Evaluation of current treatment plan related to COPD and Anxiety, Housing barriers, Social Isolation, Inability to perform ADL's independently, Lacks knowledge of community resource: oxygen tank issues and need for physical therapy/occupational therapy, and other (TBD)  self-management and patient's adherence to plan as established by provider. Collaboration with Zachary Frizzle, MD regarding development and update of comprehensive plan of care as evidenced by provider attestation  and co-signature Inter-disciplinary care team collaboration (see longitudinal plan of care) Discussed plans with patient for ongoing care management follow up and provided patient with direct contact information for care management team  Patient Goals: -expect call from Pharmacist - begin a notebook of services in my neighborhood or community - call 211 when I need some help - follow-up on any referrals for help I am given - think ahead to make sure my need does not become an emergency - make a list of family or friends that I can call     Follow Up Plan: Telephone follow up  appointment with care management team member scheduled for:  02/14/2021      Follow Up Plan: Appointment scheduled for SW follow up with client by phone on:  02/14/21 and Client will discuss oxygen options with PCP, as well as costs of RX (samples available?)       Eduard Clos MSW, [place] Licensed Clinical Social Worker [facility] [phone]

## 2021-02-03 NOTE — Patient Instructions (Signed)
Visit Information  PATIENT GOALS:  Goals Addressed               This Visit's Progress     Find Help in My Community to maximize pt's independence and overal health and QOL (pt-stated)        Timeframe:  Long-Range Goal Priority:  High Start Date:    01/09/2021                         Expected End Date:         03/15/2021              Follow Up Date 02/14/2021   -expect call from Pharmacist  - begin a notebook of services in my neighborhood or community - call 211 when I need some help - follow-up on any referrals for help I am given - think ahead to make sure my need does not become an emergency - make a list of family or friends that I can call    Why is this important?   Knowing how and where to find help for yourself or family in your neighborhood and community is an important skill.  You will want to take some steps to learn how.    Notes:          The patient verbalized understanding of instructions, educational materials, and care plan provided today and declined offer to receive copy of patient instructions, educational materials, and care plan.   Telephone follow up appointment with care management team member scheduled for:02/14/21  Eduard Clos MSW, [place] Licensed Clinical Social Worker [facility] [phone]

## 2021-02-04 ENCOUNTER — Ambulatory Visit: Payer: Medicare HMO | Admitting: Family Medicine

## 2021-02-09 DIAGNOSIS — I4891 Unspecified atrial fibrillation: Secondary | ICD-10-CM | POA: Diagnosis not present

## 2021-02-09 DIAGNOSIS — J449 Chronic obstructive pulmonary disease, unspecified: Secondary | ICD-10-CM | POA: Diagnosis not present

## 2021-02-09 DIAGNOSIS — J1282 Pneumonia due to coronavirus disease 2019: Secondary | ICD-10-CM | POA: Diagnosis not present

## 2021-02-09 DIAGNOSIS — U071 COVID-19: Secondary | ICD-10-CM | POA: Diagnosis not present

## 2021-02-09 DIAGNOSIS — J9621 Acute and chronic respiratory failure with hypoxia: Secondary | ICD-10-CM | POA: Diagnosis not present

## 2021-02-09 DIAGNOSIS — R0600 Dyspnea, unspecified: Secondary | ICD-10-CM | POA: Diagnosis not present

## 2021-02-12 DIAGNOSIS — J449 Chronic obstructive pulmonary disease, unspecified: Secondary | ICD-10-CM | POA: Diagnosis not present

## 2021-02-13 DIAGNOSIS — M6281 Muscle weakness (generalized): Secondary | ICD-10-CM | POA: Diagnosis not present

## 2021-02-13 DIAGNOSIS — J449 Chronic obstructive pulmonary disease, unspecified: Secondary | ICD-10-CM | POA: Diagnosis not present

## 2021-02-14 ENCOUNTER — Telehealth: Payer: Medicare HMO

## 2021-02-14 ENCOUNTER — Telehealth: Payer: Self-pay | Admitting: *Deleted

## 2021-02-14 ENCOUNTER — Ambulatory Visit: Payer: Medicare HMO | Admitting: Pharmacist

## 2021-02-14 ENCOUNTER — Telehealth: Payer: Self-pay | Admitting: Pharmacist

## 2021-02-14 ENCOUNTER — Ambulatory Visit (INDEPENDENT_AMBULATORY_CARE_PROVIDER_SITE_OTHER): Payer: Medicare HMO | Admitting: *Deleted

## 2021-02-14 DIAGNOSIS — J449 Chronic obstructive pulmonary disease, unspecified: Secondary | ICD-10-CM

## 2021-02-14 DIAGNOSIS — J441 Chronic obstructive pulmonary disease with (acute) exacerbation: Secondary | ICD-10-CM

## 2021-02-14 DIAGNOSIS — I4891 Unspecified atrial fibrillation: Secondary | ICD-10-CM | POA: Diagnosis not present

## 2021-02-14 DIAGNOSIS — I25110 Atherosclerotic heart disease of native coronary artery with unstable angina pectoris: Secondary | ICD-10-CM

## 2021-02-14 DIAGNOSIS — E785 Hyperlipidemia, unspecified: Secondary | ICD-10-CM

## 2021-02-14 NOTE — Patient Instructions (Signed)
Visit Information  PATIENT GOALS:  Goals Addressed             This Visit's Progress    Improve My Heart Health-Coronary Artery Disease       Timeframe:  Long-Range Goal Priority:  High Start Date:    01/17/2021                        Expected End Date:     07/19/2021                  Follow Up Date 03/17/2021   - try to do light exercise and walking short distances - know and watch for signs of a heart attack (e.g. chest pain, arm pain, nausea) - if I have chest pain, use nitroglycerin and call for help - take all medications as prescribed and continue working with pharmacist - learn about small changes that will make a big difference such as improvement in diet - choose fresh/ frozen fruits and vegetables, lean meats - follow up with primary care doctor on 7/11 and Dr. Tomi Likens on 8/4.   Why is this important?   Lifestyle changes are key to improving the blood flow to your heart. Think about the things you can change and set a goal to live healthy.  Remember, when the blood vessels to your heart start to get clogged you may not have any symptoms.  Over time, they can get worse.  Don't ignore the signs, like chest pain, and get help right away.     Notes:       Track and Manage My Symptoms-COPD       Timeframe:  Long Range Goal Priority:  High Start Date:   01/17/2021                          Expected End Date:        07/19/2021               Follow Up Date 03/17/2021   - continue to follow COPD rescue / action plan - eliminate symptom triggers at home- avoid being outside during heat of the day - follow rescue plan if symptoms flare-up - keep follow-up appointments  - use inhalers and oxygen as prescribed- no smoking near oxygen - be careful with oxygen tubing so you do not fall - call doctor early on for change in health status/ symptoms   Why is this important?   Tracking your symptoms and other information about your health helps your doctor plan your care.  Write down  the symptoms, the time of day, what you were doing and what medicine you are taking.  You will soon learn how to manage your symptoms.     Notes:          The patient verbalized understanding of instructions, educational materials, and care plan provided today and declined offer to receive copy of patient instructions, educational materials, and care plan.   Telephone follow up appointment with care management team member scheduled for:  Jacqlyn Larsen [facility], BSN RN Case Manager [facility] [phone]

## 2021-02-14 NOTE — Telephone Encounter (Signed)
  Care Management   Follow Up Note   02/14/2021 Name: Zachary Chambers MRN: [id number] DOB: 09-01-1950   Referred by: [name], MD Reason for referral : No chief complaint on file.   An unsuccessful telephone outreach was attempted today. The patient was referred to the case management team for assistance with care management and care coordination.   Follow Up Plan: Telephone follow up appointment with care management team member scheduled for: next week   Eduard Clos MSW, [place] Licensed Clinical Social Worker [place] [phone]

## 2021-02-14 NOTE — Progress Notes (Addendum)
Chronic Care Management Pharmacy Assistant   Name: RASHAWN RAYMAN  MRN: [id number] DOB: 1951-04-15  Zachary Chambers is an 70 y.o. year old male who presents for his initial CCM visit with the clinical pharmacist.  Reason for Encounter: Chart Prep   Conditions to be addressed/monitored: COPD, GERD, CAD, Anxiety.  Primary concerns for visit include: COPD  Recent office visits:  12/26/20 Dr. Dennard Schaumann For COPD/Hospital Follow-up. Per note:The doctor recommended he stop Celebrex due to the potential risk of bleeding given his past medical history and medical comorbidities. 10/07/20 Dr. Buelah Manis For follow-up. STARTED Benzonatate 100 mg 2 times daily PRN. STOPPED Diazepam and Tiotropium.  Recent consult visits:  12/06/20 Cardiology Josue Hector, MD. For CAD/follow-up. No medication changes. 10/21/20 Pulmonology Magdalen Spatz, NP. For COPD. No medication changes.   09/12/20 Neurology Pieter Partridge, DO. For CVA/follow-up. No medication changes.  Hospital visits:  12/09/20 [facility] ( 3 Days) Darliss Cheney, MD. For COPD Exacerbation. No medication changes.  Medication History: Atorvastatin 40 mg  90 DS 11/17/20  Medications: Outpatient Encounter Medications as of 02/14/2021  Medication Sig Note   albuterol (PROVENTIL) (2.5 MG/3ML) 0.083% nebulizer solution Take 3 mLs (2.5 mg total) by nebulization every 4 (four) hours as needed for wheezing or shortness of breath.    albuterol (VENTOLIN HFA) 108 (90 Base) MCG/ACT inhaler INHALE 2 PUFFS INTO THE LUNGS EVERY 4 HOURS AS NEEDED FOR WHEEZING AND SHORTNESS OF BREATH    apixaban (ELIQUIS) 5 MG TABS tablet Take 1 tablet (5 mg total) by mouth 2 (two) times daily.    aspirin 81 MG tablet Take 81 mg by mouth at bedtime.    atorvastatin (LIPITOR) 40 MG tablet Take 1 tablet (40 mg total) by mouth at bedtime. 12/11/2020: Lf 4.3.22 #90/90   Budeson-Glycopyrrol-Formoterol (BREZTRI AEROSPHERE) 160-9-4.8 MCG/ACT AERO Inhale 2 puffs into the lungs 2  (two) times daily.    Cyanocobalamin (VITAMIN B-12 PO) Take 1 tablet by mouth daily.    fluticasone (FLONASE) 50 MCG/ACT nasal spray USE 2 SPRAYS in EACH NOSTRIL EVERY DAY (Patient taking differently: Place 2 sprays into both nostrils daily.) 12/11/2020: LF 5.73.22 #0/25   folic acid (FOLVITE) 1 MG tablet TAKE 1 TABLET BY MOUTH EVERY DAY Please make appointment WITH new pcp    LORazepam (ATIVAN) 0.5 MG tablet Take 1 tablet (0.5 mg total) by mouth 2 (two) times daily as needed for anxiety. 12/11/2020: LF 3.1.22 #60/30   Multiple Vitamin (MULTIVITAMIN WITH MINERALS) TABS tablet Take 1 tablet by mouth daily.    Niacin (VITAMIN B-3 PO) Take 1 tablet by mouth daily.    nitroGLYCERIN (NITROSTAT) 0.4 MG SL tablet dissolve 1 TABLET UNDER THE TONGUE every 5 minutes AS NEEDED CHEST PAIN 12/11/2020: LF 1.2.22 #25   omeprazole (PRILOSEC) 40 MG capsule Take 1 capsule (40 mg total) by mouth daily. 12/11/2020: LF 4.3.22 #90/90   traMADol (ULTRAM) 50 MG tablet TAKE 1 TABLET BY MOUTH EVERY 6 HOURS AS NEEDED FOR moderate PAIN (Patient not taking: Reported on 01/17/2021)    zinc gluconate 50 MG tablet Take 50 mg by mouth daily.    No facility-administered encounter medications on file as of 02/14/2021.   Have you seen any other providers since your last visit? Patients wife stated no.  Any changes in your medications or health? Patients wife stated no.  Any side effects from any medications? Patients wife stated no.  Do you have an symptoms or problems not managed by your medications?  Patients wife stated no.  Any concerns about your health right now? Patients wife stated his appetite is low.   Has your provider asked that you check blood pressure, blood sugar, or follow special diet at home? Patients wife stated he checks his blood pressure.  Do you get any type of exercise on a regular basis? Patients wife stated no.  Can you think of a goal you would like to reach for your health? Patients wife stated for him to  move around more.   Do you have any problems getting your medications? Patients wife stated the Eliquis is expensive.  Is there anything that you would like to discuss during the appointment? Patients wife stated no.  Please bring medications and supplements to appointment,patients wife reminded of her husbands OTP appointment on 02/14/21 at 11 am.  [place], [place] Pharmacist Assistant [phone]

## 2021-02-14 NOTE — Chronic Care Management (AMB) (Signed)
Chronic Care Management   CCM RN Visit Note  02/14/2021 Name: Zachary Chambers MRN: [id number] DOB: [birth year]  Subjective: Zachary Chambers is a 70 y.o. year old male who is a primary care patient of Pickard, Cammie Mcgee, MD. The care management team was consulted for assistance with disease management and care coordination needs.    Engaged with patient by telephone for follow up visit in response to provider referral for case management and/or care coordination services.   Consent to Services:  The patient was given information about Chronic Care Management services, agreed to services, and gave verbal consent prior to initiation of services.  Please see initial visit note for detailed documentation.   Patient agreed to services and verbal consent obtained.   Assessment: Review of patient past medical history, allergies, medications, health status, including review of consultants reports, laboratory and other test data, was performed as part of comprehensive evaluation and provision of chronic care management services.   SDOH (Social Determinants of Health) assessments and interventions performed:    CCM Care Plan  No Known Allergies  Outpatient Encounter Medications as of 02/14/2021  Medication Sig Note   albuterol (PROVENTIL) (2.5 MG/3ML) 0.083% nebulizer solution Take 3 mLs (2.5 mg total) by nebulization every 4 (four) hours as needed for wheezing or shortness of breath.    albuterol (VENTOLIN HFA) 108 (90 Base) MCG/ACT inhaler INHALE 2 PUFFS INTO THE LUNGS EVERY 4 HOURS AS NEEDED FOR WHEEZING AND SHORTNESS OF BREATH    apixaban (ELIQUIS) 5 MG TABS tablet Take 1 tablet (5 mg total) by mouth 2 (two) times daily.    aspirin 81 MG tablet Take 81 mg by mouth at bedtime.    atorvastatin (LIPITOR) 40 MG tablet Take 1 tablet (40 mg total) by mouth at bedtime. 12/11/2020: Lf 4.3.22 #90/90   Budeson-Glycopyrrol-Formoterol (BREZTRI AEROSPHERE) 160-9-4.8 MCG/ACT AERO Inhale 2 puffs into the lungs 2 (two)  times daily.    Cyanocobalamin (VITAMIN B-12 PO) Take 1 tablet by mouth daily.    fluticasone (FLONASE) 50 MCG/ACT nasal spray USE 2 SPRAYS in EACH NOSTRIL EVERY DAY (Patient taking differently: Place 2 sprays into both nostrils daily.) 12/11/2020: LF 1.02.58 #5/27   folic acid (FOLVITE) 1 MG tablet TAKE 1 TABLET BY MOUTH EVERY DAY Please make appointment WITH new pcp    LORazepam (ATIVAN) 0.5 MG tablet Take 1 tablet (0.5 mg total) by mouth 2 (two) times daily as needed for anxiety. 12/11/2020: LF 3.1.22 #60/30   Multiple Vitamin (MULTIVITAMIN WITH MINERALS) TABS tablet Take 1 tablet by mouth daily.    Niacin (VITAMIN B-3 PO) Take 1 tablet by mouth daily.    nitroGLYCERIN (NITROSTAT) 0.4 MG SL tablet dissolve 1 TABLET UNDER THE TONGUE every 5 minutes AS NEEDED CHEST PAIN 12/11/2020: LF 1.2.22 #25   omeprazole (PRILOSEC) 40 MG capsule Take 1 capsule (40 mg total) by mouth daily. 12/11/2020: LF 4.3.22 #90/90   traMADol (ULTRAM) 50 MG tablet TAKE 1 TABLET BY MOUTH EVERY 6 HOURS AS NEEDED FOR moderate PAIN    zinc gluconate 50 MG tablet Take 50 mg by mouth daily.    No facility-administered encounter medications on file as of 02/14/2021.    Patient Active Problem List   Diagnosis Date Noted   Hypoxemia 12/10/2020   COPD exacerbation ([place]) 12/09/2020   Acute on chronic respiratory failure with hypoxia ([place]) 12/09/2020   Demand ischemia ([place]) 12/09/2020   Anxiety 10/07/2020   History of CVA with residual deficit 10/07/2020   Pneumonia due to  COVID-19 virus 07/05/2020   Chronic respiratory failure ([place]) 07/05/2020   Transient atrial fibrillation ([place]) 07/05/2020   Dyspnea 07/04/2020   Protein-calorie malnutrition, severe 03/07/2020   S/P coronary artery stent placement    Chest pain 03/06/2020   Dyslipidemia 03/06/2020   Healthcare maintenance 03/05/2020   Heavy alcohol use 03/04/2018   Memory changes 03/04/2018   Leukopenia 05/04/2017   Bruit 01/22/2014   Muscle spasm of back 09/20/2013    COPD (chronic obstructive pulmonary disease) ([place]) 01/14/2011   CAD (coronary artery disease) 01/14/2011   GERD (gastroesophageal reflux disease) 01/14/2011   TOBACCO ABUSE 07/25/2009   Osteoarthritis 07/25/2009   ROTATOR CUFF SYNDROME, RIGHT 07/25/2009    Conditions to be addressed/monitored:CAD and COPD  Care Plan : COPD (Adult)  Updates made by Kassie Mends, RN since 02/14/2021 12:00 AM     Problem: Symptom Exacerbation (COPD)   Priority: High     Long-Range Goal: Symptom Exacerbation Prevented or Minimized   Start Date: 01/17/2021  Expected End Date: 07/19/2021  This Visit's Progress: On track  Recent Progress: On track  Priority: High  Note:   Current Barriers:  Knowledge deficits related to basic understanding of COPD disease process- continues to need reinforcement of teaching/ reinforcement COPD action plan, management of anxiety, has ativan and uses as prescribed. Pt reports he does not do very well with pursed lip breathing.  Pt reports he is not smoking. Reports dyspnea with exertion. Knowledge deficit related to importance of energy conservation- caregiver would like patient to get up and walk, move around more, would like pt to try and exercise his right hand/ wrist and pt is not doing this. Unable to self administer medications as prescribed- caregiver provides oversight with medications, pt reports he has all medications and taking as prescribed. 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 Pt reports he will not consider going to any outpatient services. Unable to perform ADLs independently- has DME and caregiver assists as needed, pt is able to fix food in the kitchen, able to bathe  and ambulate, is able to drive but does not, caregiver transports patient to appointments, errands, etc. Does not contact provider office for questions/concerns - pt wants portable oxygen tank and per notes, pulmonologist reports pt is not candidate for this type of oxygen as this is inadequate for patient's needs, pt needs continuous flow oxygen with goal of 02 saturation 88-92%.  Pt reports he has not contacted pulmonologist to speak any further about portable oxygen. Pt needs reinforcement for COPD action plan if in yellow zone to call MD. Patient is working with [employer] for financial constraints, anxiety.  Care guide provided resources. Case Manager Clinical Goal(s): patient will report using inhalers as prescribed including rinsing mouth after use patient will report utilizing pursed lip breathing for shortness of breath patient will verbalize understanding of COPD action plan and when to seek appropriate levels of medical care patient will engage in lite exercise as tolerated to build/regain stamina and strength and reduce shortness of breath through activity tolerance patient will verbalize basic understanding of COPD disease process and self care activities  Interventions:  Collaboration with [name], MD regarding development and update of comprehensive plan of care  as evidenced by provider attestation and co-signature Inter-disciplinary care team collaboration (see longitudinal plan of care) Provided patient with verbal COPD education on self care/management/and exacerbation prevention  Reinforced COPD action plan and reinforced importance of daily self assessment Reviewed pursed lip breathing and importance  Reinforced with patient to self assesses COPD action plan zone and make appointment with provider if in the yellow zone for 48 hours without improvement. Encouraged patient to engage in light exercise as tolerated 3-5 days a week Reviewed importance of using oxygen as  prescribed, reinforced with patient and significant other to talk with pulmonologist about any further options for oxygen as pt is not candidate for portable oxygen. Reviewed medications with patient and importance of using inhalers as prescribed Reinforced symptom management and calling MD early on for changes in health status/ symptoms Reviewed importance of utilizing home health PT/ OT and continue to rid home of clutter, in basket sent to primary care provider requesting home health PT/ OT if MD feels appropriate. Self-Care Activities:  Attends all scheduled provider appointments Attends church or other social activities Calls provider office for new concerns or questions Patient Goals: - continue to follow COPD rescue / action plan - eliminate symptom triggers at home- avoid being outside during heat of the day - follow rescue plan if symptoms flare-up - keep follow-up appointments  - use inhalers and oxygen as prescribed- no smoking near oxygen - be careful with oxygen tubing so you do not fall - call doctor early on for change in health status/ symptoms Follow Up Plan: Telephone follow up appointment with care management team member scheduled for:  03/17/2021    Care Plan : Coronary Artery Disease  Updates made by Kassie Mends, RN since 02/14/2021 12:00 AM     Problem: Health Promotion or Disease Self-Management for CAD (General Plan of Care)   Priority: High     Long-Range Goal: Self-Management Plan Developed for management of CAD   Start Date: 01/17/2021  Expected End Date: 07/19/2021  This Visit's Progress: On track  Recent Progress: On track  Priority: High  Note:   Current Barriers:  Ineffective Self Health Maintenance - patient needs reinforcement for management of CAD, diet Unable to self administer medications as prescribed- significant other (caregiver)is a nurse) assists with and provides oversight of medications Does not adhere to provider recommendations re:  exercise, home health PT/ OT was ordered but patient declined due to clutter in the home that he and caregiver plan on removing, reports now interested and would like home health ordered, not interested in outpatient PT/ OT at present, attended previously and felt it was not helpful. Unable to perform ADLs independently-  has DME and uses as needed, WC, walker, cane, shower seat.  Pt reports he is walking currently without using DME but has on hand in case he needs, pt reports barrier to ambulating far distances is dyspnea. Clinical Goal(s):  Collaboration with [name], MD regarding development and update of comprehensive plan of care as evidenced by provider attestation and co-signature Inter-disciplinary care team collaboration (see longitudinal plan of care) patient will work with care management team to address care coordination and chronic disease management needs related to Disease Management Educational Needs Medication Management and Education Psychosocial Support   Interventions:  Evaluation of current treatment plan related to coronary artery disease. Self-management and patient's adherence to plan as established by provider. Collaboration with [name], MD regarding development and update of comprehensive plan of care as evidenced  by provider attestation       and co-signature Inter-disciplinary care team collaboration (see longitudinal plan of care) Reviewed plans with patient for ongoing care management follow up and provided patient with direct contact information for care management team Reviewed medications with patient and importance of taking as prescribed Reinforced signs/ symptoms MI and CVA and actions to take Reinforced importance of using nitroglycerin as prescribed and call 911 for unrelieved chest pain Encouraged patient (and caregiver) to try light activity and some walking daily Message sent to primary care provider for home health PT/ OT orders Self  Care Activities:  Attends all scheduled provider appointments Attends church or other social activities Calls provider office for new concerns or questions Patient Goals: - try to do light exercise and walking short distances - know and watch for signs of a heart attack (e.g. chest pain, arm pain, nausea) - if I have chest pain, use nitroglycerin and call for help - take all medications as prescribed and continue working with pharmacist - learn about small changes that will make a big difference such as improvement in diet - choose fresh/ frozen fruits and vegetables, lean meats - follow up with primary care doctor on 7/11 and Dr. Tomi Likens on 8/4 Follow Up Plan: Telephone follow up appointment with care management team member scheduled for:  03/17/2021     Plan:Telephone follow up appointment with care management team member scheduled for:  03/17/2021  Jacqlyn Larsen [facility], BSN RN Case Manager [place] [facility] [phone]

## 2021-02-14 NOTE — Progress Notes (Signed)
Chronic Care Management Pharmacy Note  02/14/2021 Name:  Zachary Chambers MRN:  [id number] DOB:  02-09-1951  Summary: Initial visit with PharmD.  Medication access is a major factor in adherence.  Have started PAP applications for Eliquis and Breztri.  Stressed importance of medication adherence.  Also patient needs new nebulizer machine from [employer].  Recommendations/Changes made from today's visit: PAP for Eliquis/Breztri Take omeprazole in the AM before food/drink.  Plan: FU in 3 months OTP   Subjective: Zachary Chambers is an 70 y.o. year old male who is a primary patient of Pickard, Cammie Mcgee, MD.  The CCM team was consulted for assistance with disease management and care coordination needs.    Engaged with patient by telephone for initial visit in response to provider referral for pharmacy case management and/or care coordination services.   Consent to Services:  The patient was given the following information about Chronic Care Management services today, agreed to services, and gave verbal consent: 1. CCM service includes personalized support from designated clinical staff supervised by the primary care provider, including individualized plan of care and coordination with other care providers 2. 24/7 contact phone numbers for assistance for urgent and routine care needs. 3. Service will only be billed when office clinical staff spend 20 minutes or more in a month to coordinate care. 4. Only one practitioner may furnish and bill the service in a calendar month. 5.The patient may stop CCM services at any time (effective at the end of the month) by phone call to the office staff. 6. The patient will be responsible for cost sharing (co-pay) of up to 20% of the service fee (after annual deductible is met). Patient agreed to services and consent obtained.  Patient Care Team: [name], MD as PCP - General (Family Medicine) Josue Hector, MD as PCP - Cardiology  (Cardiology) Edythe Clarity, [facility] as Pharmacist (Pharmacist) Pieter Partridge, DO as Consulting Physician (Neurology) Deirdre Peer, LCSW as Social Worker (Licensed Clinical Social Worker) Kassie Mends, RN as [place] [facility]  Recent office visits:  12/26/20 Dr. Dennard Schaumann For COPD/Hospital Follow-up. Per note:The doctor recommended he stop Celebrex due to the potential risk of bleeding given his past medical history and medical comorbidities. 10/07/20 Dr. Buelah Manis For follow-up. STARTED Benzonatate 100 mg 2 times daily PRN. STOPPED Diazepam and Tiotropium.   Recent consult visits:  12/06/20 Cardiology Josue Hector, MD. For CAD/follow-up. No medication changes. 10/21/20 Pulmonology Magdalen Spatz, NP. For COPD. No medication changes.   09/12/20 Neurology Pieter Partridge, DO. For CVA/follow-up. No medication changes.   Hospital visits:  12/09/20 [facility] ( 3 Days) Darliss Cheney, MD. For COPD Exacerbation. No medication changes.   Medication History: Atorvastatin 40 mg  90 DS 11/17/20   Objective:  Lab Results  Component Value Date   CREATININE 0.98 12/12/2020   BUN 27 (H) 12/12/2020   GFR 108.57 01/28/2012   GFRNONAA >60 12/12/2020   GFRAA >60 03/08/2020   NA 133 (L) 12/12/2020   K 3.8 12/12/2020   CALCIUM 9.3 12/12/2020   CO2 30 12/12/2020   GLUCOSE 117 (H) 12/12/2020    Lab Results  Component Value Date/Time   HGBA1C 5.4 03/06/2020 02:24 AM   HGBA1C 5.2 09/03/2017 02:55 PM   GFR 108.57 01/28/2012 02:53 PM    Last diabetic Eye exam: No results found for: HMDIABEYEEXA  Last diabetic Foot exam: No results found for: HMDIABFOOTEX   Lab  Results  Component Value Date   CHOL 118 03/08/2020   HDL 49 03/08/2020   LDLCALC 61 03/08/2020   TRIG 46 07/04/2020   CHOLHDL 2.4 03/08/2020    Hepatic Function Latest Ref Rng & Units 12/10/2020 08/05/2020 07/08/2020  Total Protein 6.5 - 8.1 g/dL 7.2 6.1 5.8(L)  Albumin 3.5 - 5.0 g/dL 3.7 -  3.1(L)  AST 15 - 41 U/L 36 16 42(H)  ALT 0 - 44 U/L _0 Alk Phosphatase 38 - 126 U/L 93 - 42  Total Bilirubin 0.3 - 1.2 mg/dL 1.2 0.6 1.0  Bilirubin, Direct 0.0 - 0.3 mg/dL - - -    Lab Results  Component Value Date/Time   TSH 2.191 03/06/2020 08:19 PM    CBC Latest Ref Rng & Units 12/12/2020 12/10/2020 12/09/2020  WBC 4.0 - 10.5 K/uL 10.2 12.4(H) -  Hemoglobin 13.0 - 17.0 g/dL 15.2 16.0 16.7  Hematocrit 39.0 - 52.0 % 46.2 49.1 49.0  Platelets 150 - 400 K/uL 476(H) 321 -    No results found for: VD25OH  Clinical ASCVD: Yes  The ASCVD Risk score Mikey Bussing DC Jr., et al., 2013) failed to calculate for the following reasons:   The patient has a prior MI or stroke diagnosis    Depression screen [facility] 2/9 01/17/2021 11/06/2019 07/07/2019  Decreased Interest 0 0 0  Down, Depressed, Hopeless 0 0 0  PHQ - 2 Score 0 0 0  Altered sleeping - - -  Tired, decreased energy - - -  Change in appetite - - -  Feeling bad or failure about yourself  - - -  Trouble concentrating - - -  Moving slowly or fidgety/restless - - -  Suicidal thoughts - - -  PHQ-9 Score - - -  Difficult doing work/chores - - -     Social History   Tobacco Use  Smoking Status Former   Packs/day: 1.00   Years: 40.00   Pack years: 40.00   Types: Cigarettes  Smokeless Tobacco Never  Tobacco Comments   declines patch   BP Readings from Last 3 Encounters:  12/12/20 135/73  10/07/20 (!) 148/80  09/12/20 (!) 166/82   Pulse Readings from Last 3 Encounters:  12/12/20 66  10/21/20 98  10/07/20 86   Wt Readings from Last 3 Encounters:  12/09/20 123 lb (55.8 kg)  12/06/20 123 lb (55.8 kg)  09/12/20 129 lb (58.5 kg)   BMI Readings from Last 3 Encounters:  12/09/20 18.70 kg/m  12/06/20 18.70 kg/m  09/12/20 19.61 kg/m    Assessment/Interventions: Review of patient past medical history, allergies, medications, health status, including review of consultants reports, laboratory and other test data, was  performed as part of comprehensive evaluation and provision of chronic care management services.   SDOH:  (Social Determinants of Health) assessments and interventions performed: Yes  Financial Resource Strain: Medium Risk   Difficulty of Paying Living Expenses: Somewhat hard    SDOH Screenings   Alcohol Screen: Not on file  Depression (PHQ2-9): Low Risk    PHQ-2 Score: 0  Financial Resource Strain: Medium Risk   Difficulty of Paying Living Expenses: Somewhat hard  Food Insecurity: No Food Insecurity   Worried About Charity fundraiser in the Last Year: Never true   Ran Out of Food in the Last Year: Never true  Housing: Not on file  Physical Activity: Inactive   Days of Exercise per Week: 0 days   Minutes of Exercise per Session: 0 min  Social Connections: Not on file  Stress: Not on file  Tobacco Use: Medium Risk   Smoking Tobacco Use: Former   Smokeless Tobacco Use: Never  Transportation Needs: No Transportation Needs   Lack of Transportation (Medical): No   Lack of Transportation (Non-Medical): No    CCM Care Plan  No Known Allergies  Medications Reviewed Today     Reviewed by Edythe Clarity, [facility] (Pharmacist) on 02/14/21 at 1226  Med List Status: <None>   Medication Order Taking? Sig Documenting Provider Last Dose Status Informant  albuterol (PROVENTIL) (2.5 MG/3ML) 0.083% nebulizer solution [id number] Yes Take 3 mLs (2.5 mg total) by nebulization every 4 (four) hours as needed for wheezing or shortness of breath. Parrett, Fonnie Mu, NP Taking Active Spouse/Significant Other           Med Note Corky Mull   BOF Jul 06, 2020  8:18 AM)    albuterol (VENTOLIN HFA) 108 (90 Base) MCG/ACT inhaler [id number] Yes INHALE 2 PUFFS INTO THE LUNGS EVERY 4 HOURS AS NEEDED FOR WHEEZING AND SHORTNESS OF Montel Clock, MD Taking Active   apixaban (ELIQUIS) 5 MG TABS tablet [id number] Yes Take 1 tablet (5 mg total) by mouth 2 (two) times daily. [name], MD  Taking Active   aspirin 81 MG tablet [id number] Yes Take 81 mg by mouth at bedtime. [provider] Taking Active Spouse/Significant Other  atorvastatin (LIPITOR) 40 MG tablet [id number] Yes Take 1 tablet (40 mg total) by mouth at bedtime. [name], MD Taking Active Spouse/Significant Other           Med Note Luana Shu, MONCHELL R   Wed Dec 11, 2020  8:59 AM) Lf 4.3.22 #90/90  Budeson-Glycopyrrol-Formoterol (BREZTRI AEROSPHERE) 160-9-4.8 MCG/ACT AERO [id number] Yes Inhale 2 puffs into the lungs 2 (two) times daily. [name], MD Taking Active   Cyanocobalamin (VITAMIN B-12 PO) [id number] Yes Take 1 tablet by mouth daily. [provider] Taking Active Spouse/Significant Other  fluticasone (FLONASE) 50 MCG/ACT nasal spray [id number]  USE 2 SPRAYS in EACH NOSTRIL [place]  Patient taking differently: Place 2 sprays into both nostrils daily.   Buelah Manis, Modena Nunnery, MD  Active            Med Note Luana Shu, MONCHELL R   Wed Dec 11, 2020  9:00 AM) LF 3.41.93 #7/90  folic acid (FOLVITE) 1 MG tablet [id number] Yes TAKE 1 TABLET BY MOUTH EVERY DAY Please make appointment WITH new pcp [name], MD Taking Active   LORazepam (ATIVAN) 0.5 MG tablet [id number] Yes Take 1 tablet (0.5 mg total) by mouth 2 (two) times daily as needed for anxiety. [name], MD Taking Active Spouse/Significant Other           Med Note Luana Shu, MONCHELL R   Wed Dec 11, 2020  9:01 AM) LF 3.1.22 #60/30  Multiple Vitamin (MULTIVITAMIN WITH MINERALS) TABS tablet [id number] Yes Take 1 tablet by mouth daily. Kayleen Memos, DO Taking Active Spouse/Significant Other  Niacin (VITAMIN B-3 PO) [id number]  Take 1 tablet by mouth daily. [provider]  Active Spouse/Significant Other  nitroGLYCERIN (NITROSTAT) 0.4 MG SL tablet [id number]  dissolve 1 TABLET UNDER THE TONGUE every 5 minutes AS NEEDED CHEST PAIN [place], Modena Nunnery, MD  Active            Med Note Luana Shu, MONCHELL R   Wed Dec 11, 2020  9:01  AM) LF 1.2.22 #25  omeprazole (PRILOSEC) 40 MG capsule  [id number] Yes Take 1 capsule (40 mg total) by mouth daily. [name], MD Taking Active Spouse/Significant Other           Med Note Luana Shu, MONCHELL R   Wed Dec 11, 2020  9:01 AM) LF 4.3.22 #90/90  traMADol (ULTRAM) 50 MG tablet [id number]  TAKE 1 TABLET BY MOUTH EVERY 6 HOURS AS NEEDED FOR moderate PAIN [place], Modena Nunnery, MD  Active            Med Note Fransico Him Jul 04, 2020 12:46 PM)    zinc gluconate 50 MG tablet [id number] Yes Take 50 mg by mouth daily. [provider] Taking Active Spouse/Significant Other            Patient Active Problem List   Diagnosis Date Noted   Hypoxemia 12/10/2020   COPD exacerbation ([place]) 12/09/2020   Acute on chronic respiratory failure with hypoxia ([place]) 12/09/2020   Demand ischemia ([place]) 12/09/2020   Anxiety 10/07/2020   History of CVA with residual deficit 10/07/2020   Pneumonia due to COVID-19 virus 07/05/2020   Chronic respiratory failure ([place]) 07/05/2020   Transient atrial fibrillation ([place]) 07/05/2020   Dyspnea 07/04/2020   Protein-calorie malnutrition, severe 03/07/2020   S/P coronary artery stent placement    Chest pain 03/06/2020   Dyslipidemia 03/06/2020   Healthcare maintenance 03/05/2020   Heavy alcohol use 03/04/2018   Memory changes 03/04/2018   Leukopenia 05/04/2017   Bruit 01/22/2014   Muscle spasm of back 09/20/2013   COPD (chronic obstructive pulmonary disease) ([place]) 01/14/2011   CAD (coronary artery disease) 01/14/2011   GERD (gastroesophageal reflux disease) 01/14/2011   TOBACCO ABUSE 07/25/2009   Osteoarthritis 07/25/2009   ROTATOR CUFF SYNDROME, RIGHT 07/25/2009    Immunization History  Administered Date(s) Administered   Fluad Quad(high Dose 65+) 07/07/2019, 08/05/2020   Influenza Whole 07/25/2009   Influenza, High Dose Seasonal PF 09/03/2017, 08/05/2018   Influenza,inj,Quad PF,6+ Mos 06/19/2013, 08/15/2014, 07/17/2015,  07/15/2016   Pneumococcal Conjugate-13 08/15/2014   Pneumococcal Polysaccharide-23 08/26/2016   Zoster Recombinat (Shingrix) 03/07/2018, 06/15/2018    Conditions to be addressed/monitored:  Afib, CAD, COPD, GERD, HLD  Care Plan : General Pharmacy (Adult)  Updates made by Edythe Clarity, RPH since 02/14/2021 12:00 AM     Problem: Afib, GERD, COPD, HLD   Priority: High  Onset Date: 02/14/2021     Long-Range Goal: Patient-Specific Goal   Start Date: 02/14/2021  Expected End Date: 08/17/2021  This Visit's Progress: On track  Priority: High  Note:   Current Barriers:  Unable to independently afford treatment regimen Unable to achieve control of COPD  Unable to self administer medications as prescribed Does not adhere to prescribed medication regimen  Pharmacist Clinical Goal(s):  Patient will verbalize ability to afford treatment regimen achieve control of COPD as evidenced by symptoms adhere to prescribed medication regimen as evidenced by fill dates through collaboration with PharmD and provider.   Interventions: 1:1 collaboration with [name], MD regarding development and update of comprehensive plan of care as evidenced by provider attestation and co-signature Inter-disciplinary care team collaboration (see longitudinal plan of care) Comprehensive medication review performed; medication list updated in electronic medical record  Hyperlipidemia: (LDL goal < 100) -Controlled -Current treatment: Atorvastatin 49m  -Medications previously tried: simvastatin  -Current dietary patterns: patients caretaker admits it is sometimes hard to get hime to eat -Current exercise habits: minimal to none -Educated on Cholesterol goals;  Benefits of statin for ASCVD risk reduction;  Importance of limiting foods high in cholesterol; -Recommended to continue current medication  Transient Atrial Fibrillation (Goal: prevent stroke and major bleeding) -Controlled -Current  treatment: Rate control: N/A Anticoagulation: Eliquis 26m bid -Medications previously tried: none noted -Home BP and HR readings: monitors HR at home using pulse oximeter, has BP cuff at home also.  No logs of readings today, informed them to keep an eye on it and reach out if anything was out of range.  -Counseled on increased risk of stroke due to Afib and benefits of anticoagulation for stroke prevention; bleeding risk associated with Eliquis and importance of self-monitoring for signs/symptoms of bleeding; seeking medical attention after a head injury or if there is blood in the urine/stool; -Recommended to continue current medication Assessed patient finances. They are currently having a difficult time paying for Eliquis.  [place] based [place].  Reviewed steps to application, patient will return once complete.  COPD (Goal: control symptoms and prevent exacerbations) -Uncontrolled -Current treatment  Breztri Aerosphere 160-9-4.8 mcg 2 puffs bid Albuterol HFA 90 mcg prn  Albuterol HFA 0.083% nebs prn -Medications previously tried: Trelegy, Spiriva, Symbicort  Pulmonary Functions Testing Results:  TLC  Date Value Ref Range Status  07/18/2013 5.13 L Final  -Exacerbations requiring treatment in last 6 months: one -Patient reports consistent use of maintenance inhaler, although he reports switching back and for the between his Breztri and Trelegy based on what he has in stock? -Frequency of rescue inhaler use: daily -Counseled on Proper inhaler technique; Benefits of consistent maintenance inhaler use Differences between maintenance and rescue inhalers -Recommended to continue current medication Assessed patient finances. [facility]  Really emphasized adherence twice daily every day as he now will be getting the medication for free. He also needs a new nebulizer machine as his last one  broke.  He gets this from [employer], will contact nurse to see about ordering a new one.  GERD (Goal: Control symptoms) -Not ideally controlled -Current treatment  Omeprazole 262mdaily -Medications previously tried: none noted -Noted some symptoms of increasing acid reflux/heartburn lately.  He was taking this medication at lunch time with a few other medications.  -Recommended to continue current medication Recommended he try to take medication first thing in the morning at least 30 to 60 minutes before food or drink so that it has time to work as directed.  Anxiety (Goal: Minimize symptoms) -Controlled -Current treatment: Lorazepam 0.54m49malf tablet bid prn -Medications previously tried/failed: Wellbutrin, hydroxyzine -PHQ9:  PHQ9 SCORE ONLY 01/17/2021 11/06/2019 07/07/2019  PHQ-9 Total Score 0 0 0   -GAD7: No flowsheet data found.  -Patient using for sleep, reports sleep is off and on.  Sometimes does not go to bed until 5 to 6 am, caregiver stays up with him. -Educated on Benefits of medication for symptom control -Recommended to continue current medication  Patient Goals/Self-Care Activities Patient will:  - take medications as prescribed focus on medication adherence by fill date/pill count check blood pressure when symptomatic, document, and provide at future appointments collaborate with provider on medication access solutions  Follow Up Plan: The care management team will reach out to the patient again over the next 90 days.         Medication Assistance: Application for Breztri/Eliquis  medication assistance program. in process.  Anticipated assistance start date unkonwn.  See plan of care for additional detail.  Compliance/Adherence/Medication fill history:  Care Gaps: None at this time  Star-Rating Drugs: Atorvastatin 40 mg  90 DS 11/17/20  Patient's preferred pharmacy is:  [place], [place] - [address]  [address] [place] [address] Phone: [phone] Fax: [phone]  [facility] ([facility] - [place], [place] [address] Phone: [phone] Fax: [phone]  Uses pill box? No - caregiver gives him meds as scheduled Pt endorses 100% compliance  We discussed: Benefits of medication synchronization, packaging and delivery as well as enhanced pharmacist oversight with [employer]. Patient decided to: Continue current medication management strategy  Care Plan and Follow Up Patient Decision:  Patient agrees to Care Plan and Follow-up.  Plan: The care management team will reach out to the patient again over the next 90 days.  Beverly Milch, PharmD Clinical Pharmacist [place] [phone]

## 2021-02-14 NOTE — Patient Instructions (Addendum)
Visit Information   Goals Addressed             This Visit's Progress    Track and Manage My Symptoms-COPD       Timeframe:  Long Range Goal Priority:  High Start Date:   01/17/2021                          Expected End Date:        07/19/2021               Follow Up Date 03/17/2021   - continue to follow COPD rescue / action plan - eliminate symptom triggers at home- avoid being outside during heat of the day - follow rescue plan if symptoms flare-up - keep follow-up appointments  - use inhalers and oxygen as prescribed- no smoking near oxygen - be careful with oxygen tubing so you do not fall - call doctor early on for change in health status/ symptoms   Why is this important?   Tracking your symptoms and other information about your health helps your doctor plan your care.  Write down the symptoms, the time of day, what you were doing and what medicine you are taking.  You will soon learn how to manage your symptoms.     Notes:   02/14/21 PharmD - working on PAP for Breztri to improve adherence.           Patient Care Plan: General Pharmacy (Adult)        Long-Range Goal: Patient-Specific Goal   Start Date: 02/14/2021  Expected End Date: 08/17/2021  This Visit's Progress: On track  Priority: High  Note:   Current Barriers:  Unable to independently afford treatment regimen Unable to achieve control of COPD  Unable to self administer medications as prescribed Does not adhere to prescribed medication regimen  Pharmacist Clinical Goal(s):  Patient will verbalize ability to afford treatment regimen achieve control of COPD as evidenced by symptoms adhere to prescribed medication regimen as evidenced by fill dates through collaboration with PharmD and provider.   Interventions: 1:1 collaboration with [name], MD regarding development and update of comprehensive plan of care as evidenced by provider attestation and co-signature Inter-disciplinary care team  collaboration (see longitudinal plan of care) Comprehensive medication review performed; medication list updated in electronic medical record  Hyperlipidemia: (LDL goal < 100) -Controlled -Current treatment: Atorvastatin 40mg   -Medications previously tried: simvastatin  -Current dietary patterns: patients caretaker admits it is sometimes hard to get hime to eat -Current exercise habits: minimal to none -Educated on Cholesterol goals;  Benefits of statin for ASCVD risk reduction; Importance of limiting foods high in cholesterol; -Recommended to continue current medication  Transient Atrial Fibrillation (Goal: prevent stroke and major bleeding) -Controlled -Current treatment: Rate control: N/A Anticoagulation: Eliquis 5mg  bid -Medications previously tried: none noted -Home BP and HR readings: monitors HR at home using pulse oximeter, has BP cuff at home also.  No logs of readings today, informed them to keep an eye on it and reach out if anything was out of range.  -Counseled on increased risk of stroke due to Afib and benefits of anticoagulation for stroke prevention; bleeding risk associated with Eliquis and importance of self-monitoring for signs/symptoms of bleeding; seeking medical attention after a head injury or if there is blood in the urine/stool; -Recommended to continue current medication Assessed patient finances. They are currently having a difficult time paying for Eliquis.  We will  start PAP for [name] program as I believe he qualifies based on income.  Reviewed steps to application, patient will return once complete.  COPD (Goal: control symptoms and prevent exacerbations) -Uncontrolled -Current treatment  Breztri Aerosphere 160-9-4.8 mcg 2 puffs bid Albuterol HFA 90 mcg prn  Albuterol HFA 0.083% nebs prn -Medications previously tried: Trelegy, Spiriva, Symbicort  Pulmonary Functions Testing Results:  TLC  Date Value Ref Range Status  07/18/2013  5.13 L Final  -Exacerbations requiring treatment in last 6 months: one -Patient reports consistent use of maintenance inhaler, although he reports switching back and for the between his Breztri and Trelegy based on what he has in stock? -Frequency of rescue inhaler use: daily -Counseled on Proper inhaler technique; Benefits of consistent maintenance inhaler use Differences between maintenance and rescue inhalers -Recommended to continue current medication Assessed patient finances. Will also begin PAP for [name] program.  Really emphasized adherence twice daily every day as he now will be getting the medication for free. He also needs a new nebulizer machine as his last one broke.  He gets this from [facility] [employer], will contact nurse to see about ordering a new one.  GERD (Goal: Control symptoms) -Not ideally controlled -Current treatment  Omeprazole 20mg  daily -Medications previously tried: none noted -Noted some symptoms of increasing acid reflux/heartburn lately.  He was taking this medication at lunch time with a few other medications.  -Recommended to continue current medication Recommended he try to take medication first thing in the morning at least 30 to 60 minutes before food or drink so that it has time to work as directed.  Anxiety (Goal: Minimize symptoms) -Controlled -Current treatment: Lorazepam 0.5mg  half tablet bid prn -Medications previously tried/failed: Wellbutrin, hydroxyzine -PHQ9:  PHQ9 SCORE ONLY 01/17/2021 11/06/2019 07/07/2019  PHQ-9 Total Score 0 0 0   -GAD7: No flowsheet data found.  -Patient using for sleep, reports sleep is off and on.  Sometimes does not go to bed until 5 to 6 am, caregiver stays up with him. -Educated on Benefits of medication for symptom control -Recommended to continue current medication  Patient Goals/Self-Care Activities Patient will:  - take medications as prescribed focus on medication adherence by fill date/pill count check  blood pressure when symptomatic, document, and provide at future appointments collaborate with provider on medication access solutions  Follow Up Plan: The care management team will reach out to the patient again over the next 90 days.        Zachary Chambers was given information about Chronic Care Management services today including:  CCM service includes personalized support from designated clinical staff supervised by his physician, including individualized plan of care and coordination with other care providers 24/7 contact phone numbers for assistance for urgent and routine care needs. Standard insurance, coinsurance, copays and deductibles apply for chronic care management only during months in which we provide at least 20 minutes of these services. Most insurances cover these services at 100%, however patients may be responsible for any copay, coinsurance and/or deductible if applicable. This service may help you avoid the need for more expensive face-to-face services. Only one practitioner may furnish and bill the service in a calendar month. The patient may stop CCM services at any time (effective at the end of the month) by phone call to the office staff.  Patient agreed to services and verbal consent obtained.   The patient verbalized understanding of instructions, educational materials, and care plan provided today and agreed to receive a mailed copy  of patient instructions, educational materials, and care plan.  The pharmacy team will reach out to the patient again over the next 90 days.   Edythe Clarity, [place]

## 2021-02-19 ENCOUNTER — Ambulatory Visit: Payer: Medicare HMO | Admitting: *Deleted

## 2021-02-19 DIAGNOSIS — I4891 Unspecified atrial fibrillation: Secondary | ICD-10-CM

## 2021-02-19 DIAGNOSIS — J441 Chronic obstructive pulmonary disease with (acute) exacerbation: Secondary | ICD-10-CM

## 2021-02-19 NOTE — Chronic Care Management (AMB) (Signed)
Chronic Care Management    Clinical Social Work Note  02/19/2021 Name: Zachary Chambers MRN: [id number] DOB: [birth year]  Zachary Chambers is a 70 y.o. year old male who is a primary care patient of Pickard, Cammie Mcgee, MD. The CCM team was consulted to assist the patient with chronic disease management and/or care coordination needs related to: [employer]  and Caregiver Stress.   Engaged with patient by telephone for follow up visit in response to provider referral for social work chronic care management and care coordination services.   Consent to Services:  The patient was given information about Chronic Care Management services, agreed to services, and gave verbal consent prior to initiation of services.  Please see initial visit note for detailed documentation.   Patient agreed to services and consent obtained.   Assessment: Review of patient past medical history, allergies, medications, and health status, including review of relevant consultants reports was performed today as part of a comprehensive evaluation and provision of chronic care management and care coordination services.     SDOH (Social Determinants of Health) assessments and interventions performed:    Advanced Directives Status: Not addressed in this encounter.  CCM Care Plan  No Known Allergies  Outpatient Encounter Medications as of 02/19/2021  Medication Sig Note   albuterol (PROVENTIL) (2.5 MG/3ML) 0.083% nebulizer solution Take 3 mLs (2.5 mg total) by nebulization every 4 (four) hours as needed for wheezing or shortness of breath.    albuterol (VENTOLIN HFA) 108 (90 Base) MCG/ACT inhaler INHALE 2 PUFFS INTO THE LUNGS EVERY 4 HOURS AS NEEDED FOR WHEEZING AND SHORTNESS OF BREATH    apixaban (ELIQUIS) 5 MG TABS tablet Take 1 tablet (5 mg total) by mouth 2 (two) times daily.    aspirin 81 MG tablet Take 81 mg by mouth at bedtime.    atorvastatin (LIPITOR) 40 MG tablet Take 1 tablet (40 mg total) by mouth at bedtime.  12/11/2020: Lf 4.3.22 #90/90   Budeson-Glycopyrrol-Formoterol (BREZTRI AEROSPHERE) 160-9-4.8 MCG/ACT AERO Inhale 2 puffs into the lungs 2 (two) times daily.    Cyanocobalamin (VITAMIN B-12 PO) Take 1 tablet by mouth daily.    fluticasone (FLONASE) 50 MCG/ACT nasal spray USE 2 SPRAYS in EACH NOSTRIL EVERY DAY (Patient taking differently: Place 2 sprays into both nostrils daily.) 12/11/2020: LF 9.56.21 #3/08   folic acid (FOLVITE) 1 MG tablet TAKE 1 TABLET BY MOUTH EVERY DAY Please make appointment WITH new pcp    LORazepam (ATIVAN) 0.5 MG tablet Take 1 tablet (0.5 mg total) by mouth 2 (two) times daily as needed for anxiety. 12/11/2020: LF 3.1.22 #60/30   Multiple Vitamin (MULTIVITAMIN WITH MINERALS) TABS tablet Take 1 tablet by mouth daily.    Niacin (VITAMIN B-3 PO) Take 1 tablet by mouth daily.    nitroGLYCERIN (NITROSTAT) 0.4 MG SL tablet dissolve 1 TABLET UNDER THE TONGUE every 5 minutes AS NEEDED CHEST PAIN 12/11/2020: LF 1.2.22 #25   omeprazole (PRILOSEC) 40 MG capsule Take 1 capsule (40 mg total) by mouth daily. 12/11/2020: LF 4.3.22 #90/90   traMADol (ULTRAM) 50 MG tablet TAKE 1 TABLET BY MOUTH EVERY 6 HOURS AS NEEDED FOR moderate PAIN    zinc gluconate 50 MG tablet Take 50 mg by mouth daily.    No facility-administered encounter medications on file as of 02/19/2021.    Patient Active Problem List   Diagnosis Date Noted   Hypoxemia 12/10/2020   COPD exacerbation ([place]) 12/09/2020   Acute on chronic respiratory failure with hypoxia ([place]) 12/09/2020  Demand ischemia ([place]) 12/09/2020   Anxiety 10/07/2020   History of CVA with residual deficit 10/07/2020   Pneumonia due to COVID-19 virus 07/05/2020   Chronic respiratory failure ([place]) 07/05/2020   Transient atrial fibrillation ([place]) 07/05/2020   Dyspnea 07/04/2020   Protein-calorie malnutrition, severe 03/07/2020   S/P coronary artery stent placement    Chest pain 03/06/2020   Dyslipidemia 03/06/2020   Healthcare maintenance 03/05/2020    Heavy alcohol use 03/04/2018   Memory changes 03/04/2018   Leukopenia 05/04/2017   Bruit 01/22/2014   Muscle spasm of back 09/20/2013   COPD (chronic obstructive pulmonary disease) ([place]) 01/14/2011   CAD (coronary artery disease) 01/14/2011   GERD (gastroesophageal reflux disease) 01/14/2011   TOBACCO ABUSE 07/25/2009   Osteoarthritis 07/25/2009   ROTATOR CUFF SYNDROME, RIGHT 07/25/2009    Conditions to be addressed/monitored: COPD; Lacks knowledge of community resource: s  Care Plan : LCSW Plan of Care  Updates made by Deirdre Peer, LCSW since 02/19/2021 12:00 AM     Problem: Limited Mobility and Independence due to physical and breathing barriers   Priority: High     Long-Range Goal: Link pt with resources to assist with removal of barriers limiting his ability/independence Completed 02/19/2021  Start Date: 01/09/2021  Expected End Date: 03/15/2021  This Visit's Progress: On track  Recent Progress: On track  Priority: High  Note:   Current Barriers:  Ineffective Self Health Maintenance  Unable to independently manage self care Does not adhere to provider recommendations re: home health (pt declined due to home conditions-clutter) Oxygen tank limitations- per family, has to remain plugged in and limits him to not being able to get outside or go away for long (portable tanks have been unreliable per their report)  Clinical Goal(s):  Collaboration with [name], MD regarding development and update of comprehensive plan of care as evidenced by provider attestation and co-signature Inter-disciplinary care team collaboration (see longitudinal plan of care) patient will work with care management team to address care coordination and chronic disease management needs related to [place] support Other community resources     Interventions:  CSW made follow up contact with pt and his significant other/housemate/caregiver- Zachary Chambers.  They report they are doing ok; pt reports having a stomach virus today.  Per pt, nothing more with the home health services; says he will be seeing PCP next week- encouraged him to ask about this if nothing is heard by then. CSW will also alert RNCM.   CSW has provided and discussed resources as well as placed referral to Care Guide.  CSW will sign off seeing no current needs for LCSW.   Evaluation of current treatment plan related to COPD and Anxiety, Housing barriers, Social Isolation, Inability to perform ADL's independently, Lacks knowledge of community resource: oxygen tank issues and need for physical therapy/occupational therapy, and other (TBD)  self-management and patient's adherence to plan as established by provider. Collaboration with [name], MD regarding development and update of comprehensive plan of care as evidenced by provider attestation       and co-signature Inter-disciplinary care team collaboration (see longitudinal plan of care) Discussed plans with patient for ongoing care management follow up and provided patient with direct contact information for care management team  Patient Goals: -expect call from Pharmacist - begin a notebook of services in my neighborhood or community - call 211 when I need some help - follow-up on any referrals for help I  am given - think ahead to make sure my need does not become an emergency - make a list of family or friends that I can call     Follow Up Plan: CSW is signing off- please re-consult if LCSW needs arise.   Eduard Clos MSW, LCSW Licensed Clinical Social Worker [place] [phone]       Follow Up Plan: Client will contact CSW if needs arise      Eduard Clos MSW, LCSW Licensed Clinical Social Worker [facility] [phone]

## 2021-02-19 NOTE — Patient Instructions (Signed)
Visit Information  PATIENT GOALS:  Goals Addressed               This Visit's Progress     COMPLETED: Find Help in My Community to maximize pt's independence and overal health and QOL (pt-stated)        Timeframe:  Long-Range Goal Priority:  High Start Date:    01/09/2021                         Expected End Date:         03/15/2021              Follow Up Date 02/14/2021   -expect call from Pharmacist  - begin a notebook of services in my neighborhood or community - call 211 when I need some help - follow-up on any referrals for help I am given - think ahead to make sure my need does not become an emergency - make a list of family or friends that I can call    Why is this important?   Knowing how and where to find help for yourself or family in your neighborhood and community is an important skill.  You will want to take some steps to learn how.    Notes:          The patient verbalized understanding of instructions, educational materials, and care plan provided today and declined offer to receive copy of patient instructions, educational materials, and care plan.   No further follow up required for LCSW  Eduard Clos MSW, LCSW Licensed Clinical Social Worker [place] [phone]

## 2021-02-20 ENCOUNTER — Encounter: Payer: Self-pay | Admitting: Internal Medicine

## 2021-02-21 ENCOUNTER — Other Ambulatory Visit: Payer: Self-pay | Admitting: *Deleted

## 2021-02-24 ENCOUNTER — Ambulatory Visit (INDEPENDENT_AMBULATORY_CARE_PROVIDER_SITE_OTHER): Payer: Medicare HMO | Admitting: Family Medicine

## 2021-02-24 ENCOUNTER — Other Ambulatory Visit: Payer: Self-pay

## 2021-02-24 ENCOUNTER — Encounter: Payer: Self-pay | Admitting: Family Medicine

## 2021-02-24 VITALS — BP 160/100 | Temp 98.2°F

## 2021-02-24 DIAGNOSIS — I6930 Unspecified sequelae of cerebral infarction: Secondary | ICD-10-CM

## 2021-02-24 DIAGNOSIS — I2510 Atherosclerotic heart disease of native coronary artery without angina pectoris: Secondary | ICD-10-CM

## 2021-02-24 DIAGNOSIS — J449 Chronic obstructive pulmonary disease, unspecified: Secondary | ICD-10-CM | POA: Diagnosis not present

## 2021-02-24 DIAGNOSIS — J441 Chronic obstructive pulmonary disease with (acute) exacerbation: Secondary | ICD-10-CM

## 2021-02-24 DIAGNOSIS — I4891 Unspecified atrial fibrillation: Secondary | ICD-10-CM

## 2021-02-24 MED ORDER — PREDNISONE 20 MG PO TABS: 40.0000 mg | ORAL_TABLET | Freq: Every day | ORAL | 0 refills | Status: DC

## 2021-02-24 NOTE — Progress Notes (Signed)
Subjective:    Patient ID: Zachary Chambers, male    DOB: 18-Oct-1950, 70 y.o.   MRN: [id number]  HPI Patient has end-stage COPD.  He presents today reporting shortness of breath.  Has been struggling to breathe all weekend.  He states that he has no "rescue medicine at home".  History is difficult due to different stories from the patient and his caregiver however it sounds like he is used his Breztri 4 times today and 5 times yesterday as a rescue medicine.  He denies using any nebulizer.  He denies using any albuterol.  Today on exam, he has markedly diminished breath sounds in all 4 lung fields.  There is very little air movement appreciated on exam.  I cannot even appreciate any wheezes.  He has increased work of breathing and accessory muscle use.  He is able to answer my questions but has to take a deep breath after every sentence.  He denies any fevers or chills. Past Medical History:  Diagnosis Date   COPD (chronic obstructive pulmonary disease) ([place])    not on home O2   DJD (degenerative joint disease)    diffusely   GERD (gastroesophageal reflux disease)    Hyperlipidemia    MI (myocardial infarction) ([place]) 2006   small MI and stents x 3  Dr. Doreatha Lew   Paroxysmal atrial fibrillation ([place])    PUD (peptic ulcer disease)    with bleeding   Rotator cuff disorder    has been evaluated by Dr Clifton James and Koleen Distance    Past Surgical History:  Procedure Laterality Date   COLONOSCOPY     EYE SURGERY     right eye removed at age 2   heart stent     x3- 2006   Current Outpatient Medications on File Prior to Visit  Medication Sig Dispense Refill   albuterol (PROVENTIL) (2.5 MG/3ML) 0.083% nebulizer solution Take 3 mLs (2.5 mg total) by nebulization every 4 (four) hours as needed for wheezing or shortness of breath. 180 mL 4   albuterol (VENTOLIN HFA) 108 (90 Base) MCG/ACT inhaler INHALE 2 PUFFS INTO THE LUNGS EVERY 4 HOURS AS NEEDED FOR WHEEZING AND SHORTNESS OF BREATH 18 g 0   apixaban  (ELIQUIS) 5 MG TABS tablet Take 1 tablet (5 mg total) by mouth 2 (two) times daily. 60 tablet 3   aspirin 81 MG tablet Take 81 mg by mouth at bedtime.     atorvastatin (LIPITOR) 40 MG tablet Take 1 tablet (40 mg total) by mouth at bedtime. 90 tablet 3   Budeson-Glycopyrrol-Formoterol (BREZTRI AEROSPHERE) 160-9-4.8 MCG/ACT AERO Inhale 2 puffs into the lungs 2 (two) times daily. 32.1 g 3   Cyanocobalamin (VITAMIN B-12 PO) Take 1 tablet by mouth daily.     fluticasone (FLONASE) 50 MCG/ACT nasal spray USE 2 SPRAYS in EACH NOSTRIL EVERY DAY (Patient taking differently: Place 2 sprays into both nostrils daily.) 16 g 6   folic acid (FOLVITE) 1 MG tablet TAKE 1 TABLET BY MOUTH EVERY DAY Please make appointment WITH new pcp 30 tablet 0   LORazepam (ATIVAN) 0.5 MG tablet Take 1 tablet (0.5 mg total) by mouth 2 (two) times daily as needed for anxiety. 180 tablet 0   Multiple Vitamin (MULTIVITAMIN WITH MINERALS) TABS tablet Take 1 tablet by mouth daily. 90 tablet 0   Niacin (VITAMIN B-3 PO) Take 1 tablet by mouth daily.     nitroGLYCERIN (NITROSTAT) 0.4 MG SL tablet dissolve 1 TABLET UNDER THE TONGUE every  5 minutes AS NEEDED CHEST PAIN 25 tablet 0   omeprazole (PRILOSEC) 40 MG capsule Take 1 capsule (40 mg total) by mouth daily. 90 capsule 3   traMADol (ULTRAM) 50 MG tablet TAKE 1 TABLET BY MOUTH EVERY 6 HOURS AS NEEDED FOR moderate PAIN 30 tablet 0   zinc gluconate 50 MG tablet Take 50 mg by mouth daily.     No current facility-administered medications on file prior to visit.   No Known Allergies Social History   Socioeconomic History   Marital status: Single    Spouse name: Not on file   Number of children: Not on file   Years of education: Not on file   Highest education level: Not on file  Occupational History   Occupation: retired  Tobacco Use   Smoking status: Former    Packs/day: 1.00    Years: 40.00    Pack years: 40.00    Types: Cigarettes   Smokeless tobacco: Never   Tobacco  comments:    declines patch  Vaping Use   Vaping Use: Never used  Substance and Sexual Activity   Alcohol use: Yes    Alcohol/week: 30.0 standard drinks    Types: 30 Cans of beer per week    Comment: h/o heavy use, now 4 beers per day   Drug use: Not Currently   Sexual activity: Yes  Other Topics Concern   Not on file  Social History Narrative   Right Handed   Lives in one story home       Patient has not smoked for 3 months   Patient on 2L of Oxygen    Social Determinants of Health   Financial Resource Strain: Medium Risk   Difficulty of Paying Living Expenses: Somewhat hard  Food Insecurity: No Food Insecurity   Worried About Charity fundraiser in the Last Year: Never true   Ran Out of Food in the Last Year: Never true  Transportation Needs: No Transportation Needs   Lack of Transportation (Medical): No   Lack of Transportation (Non-Medical): No  Physical Activity: Inactive   Days of Exercise per Week: 0 days   Minutes of Exercise per Session: 0 min  Stress: Not on file  Social Connections: Not on file  Intimate Partner Violence: Not on file      Review of Systems  All other systems reviewed and are negative.     Objective:   Physical Exam Vitals reviewed.  Constitutional:      General: He is not in acute distress.    Appearance: He is not toxic-appearing.  Cardiovascular:     Rate and Rhythm: Normal rate and regular rhythm.     Heart sounds: Normal heart sounds. No murmur heard.   No friction rub. No gallop.  Pulmonary:     Effort: Pulmonary effort is normal.     Breath sounds: Decreased air movement present. Decreased breath sounds and wheezing present.  Abdominal:     General: Abdomen is flat. Bowel sounds are normal.     Palpations: Abdomen is soft.  Musculoskeletal:     Right lower leg: No edema.     Left lower leg: No edema.  Neurological:     General: No focal deficit present.     Mental Status: He is alert and oriented to person, place, and  time. Mental status is at baseline.     Cranial Nerves: No cranial nerve deficit.          Assessment & Plan:  COPD exacerbation (HCC)  Transient atrial fibrillation (HCC)  Chronic obstructive pulmonary disease, unspecified COPD type ([place])  History of CVA with residual deficit  CAD in native artery Patient is clearly having a COPD exacerbation.  I immediately gave the patient albuterol 2.5 mg neb and Atrovent 0.5 mg neb x1 immediately.  This was at 310.  I will also give him Depo-Medrol 60 mg IM x1 now.  He will need outpatient prednisone and rescue medication along with a thorough explanation of how it is to be used.  Reassess the patient in 15 minutes. Breathing improved after the nebulizer treatment.  Patient was reeducated.  I want him to use Breztri 2 puffs inhaled twice daily.  This is a maintenance medicine and not a rescue medicine.  I want him to use albuterol 2.5 mg nebs every 6 hours as he is in a COPD attack.  I want him to use prednisone 40 mg daily for 1 week and then recheck in 1 week.  Patient has not been using his rescue medication.  His caregiver verifies that he has the albuterol at home he was just confused as to how to use it.  If his breathing worsens I want him to go to the emergency room.

## 2021-03-04 ENCOUNTER — Other Ambulatory Visit: Payer: Self-pay

## 2021-03-04 ENCOUNTER — Ambulatory Visit (INDEPENDENT_AMBULATORY_CARE_PROVIDER_SITE_OTHER): Payer: Medicare HMO | Admitting: Family Medicine

## 2021-03-04 ENCOUNTER — Encounter: Payer: Self-pay | Admitting: Family Medicine

## 2021-03-04 VITALS — BP 140/68 | HR 90 | Temp 99.2°F | Resp 18 | Ht 68.0 in | Wt 125.8 lb

## 2021-03-04 DIAGNOSIS — J441 Chronic obstructive pulmonary disease with (acute) exacerbation: Secondary | ICD-10-CM | POA: Diagnosis not present

## 2021-03-04 NOTE — Progress Notes (Signed)
Subjective:    Patient ID: Zachary Chambers, male    DOB: [birth year], 70 y.o.   MRN: [id number]  HPI 02/24/21 Patient has end-stage COPD.  He presents today reporting shortness of breath.  Has been struggling to breathe all weekend.  He states that he has no "rescue medicine at home".  History is difficult due to different stories from the patient and his caregiver however it sounds like he is used his Breztri 4 times today and 5 times yesterday as a rescue medicine.  He denies using any nebulizer.  He denies using any albuterol.  Today on exam, he has markedly diminished breath sounds in all 4 lung fields.  There is very little air movement appreciated on exam.  I cannot even appreciate any wheezes.  He has increased work of breathing and accessory muscle use.  He is able to answer my questions but has to take a deep breath after every sentence.  He denies any fevers or chills.  At that time, my plan was:  Patient is clearly having a COPD exacerbation.  I immediately gave the patient albuterol 2.5 mg neb and Atrovent 0.5 mg neb x1 immediately.  This was at 310.  I will also give him Depo-Medrol 60 mg IM x1 now.  He will need outpatient prednisone and rescue medication along with a thorough explanation of how it is to be used.  Reassess the patient in 15 minutes. Breathing improved after the nebulizer treatment.  Patient was reeducated.  I want him to use Breztri 2 puffs inhaled twice daily.  This is a maintenance medicine and not a rescue medicine.  I want him to use albuterol 2.5 mg nebs every 6 hours as he is in a COPD attack.  I want him to use prednisone 40 mg daily for 1 week and then recheck in 1 week.  Patient has not been using his rescue medication.  His caregiver verifies that he has the albuterol at home he was just confused as to how to use it.  If his breathing worsens I want him to go to the emergency room.  03/04/21 Patient is here today for follow-up.  His breathing has improved  dramatically.  He is still confused about how to use Breztri.  I repeatedly explained to him he should use it 2 puffs twice daily.  He is using albuterol 2-3 times a day.  His wheezing has improved dramatically.  His pulmonary exam today is significant for reduced breath sounds in all 4 lung fields but there are no crackles or rails or wheezing appreciated.  He still requires oxygen.  He is on 2 L via nasal cannula even sitting in a wheelchair.  Even sitting on oxygen he is 95%.  He is unable to come off oxygen.  He has drastic drops in his oxygen if he stops at.  He benefits dramatically from oxygen and will be in the hospital if he does not continue his oxygen at home.  Therefore he qualifies for continuous oxygen at home.  He requires 2 L via nasal cannula continuously. Past Medical History:  Diagnosis Date   COPD (chronic obstructive pulmonary disease) ([place])    not on home O2   DJD (degenerative joint disease)    diffusely   GERD (gastroesophageal reflux disease)    Hyperlipidemia    MI (myocardial infarction) ([place]) 2006   small MI and stents x 3  Dr. Doreatha Lew   Paroxysmal atrial fibrillation ([place])    PUD (peptic  ulcer disease)    with bleeding   Rotator cuff disorder    has been evaluated by Dr Clifton James and Koleen Distance    Past Surgical History:  Procedure Laterality Date   COLONOSCOPY     EYE SURGERY     right eye removed at age 44   heart stent     x3- 2006   Current Outpatient Medications on File Prior to Visit  Medication Sig Dispense Refill   albuterol (PROVENTIL) (2.5 MG/3ML) 0.083% nebulizer solution Take 3 mLs (2.5 mg total) by nebulization every 4 (four) hours as needed for wheezing or shortness of breath. 180 mL 4   albuterol (VENTOLIN HFA) 108 (90 Base) MCG/ACT inhaler INHALE 2 PUFFS INTO THE LUNGS EVERY 4 HOURS AS NEEDED FOR WHEEZING AND SHORTNESS OF BREATH 18 g 0   apixaban (ELIQUIS) 5 MG TABS tablet Take 1 tablet (5 mg total) by mouth 2 (two) times daily. 60 tablet 3    aspirin 81 MG tablet Take 81 mg by mouth at bedtime.     atorvastatin (LIPITOR) 40 MG tablet Take 1 tablet (40 mg total) by mouth at bedtime. 90 tablet 3   Budeson-Glycopyrrol-Formoterol (BREZTRI AEROSPHERE) 160-9-4.8 MCG/ACT AERO Inhale 2 puffs into the lungs 2 (two) times daily. 32.1 g 3   Cyanocobalamin (VITAMIN B-12 PO) Take 1 tablet by mouth daily.     fluticasone (FLONASE) 50 MCG/ACT nasal spray USE 2 SPRAYS in EACH NOSTRIL EVERY DAY (Patient taking differently: Place 2 sprays into both nostrils daily.) 16 g 6   folic acid (FOLVITE) 1 MG tablet TAKE 1 TABLET BY MOUTH EVERY DAY Please make appointment WITH new pcp 30 tablet 0   LORazepam (ATIVAN) 0.5 MG tablet Take 1 tablet (0.5 mg total) by mouth 2 (two) times daily as needed for anxiety. 180 tablet 0   Multiple Vitamin (MULTIVITAMIN WITH MINERALS) TABS tablet Take 1 tablet by mouth daily. 90 tablet 0   Niacin (VITAMIN B-3 PO) Take 1 tablet by mouth daily.     nitroGLYCERIN (NITROSTAT) 0.4 MG SL tablet dissolve 1 TABLET UNDER THE TONGUE every 5 minutes AS NEEDED CHEST PAIN 25 tablet 0   omeprazole (PRILOSEC) 40 MG capsule Take 1 capsule (40 mg total) by mouth daily. 90 capsule 3   traMADol (ULTRAM) 50 MG tablet TAKE 1 TABLET BY MOUTH EVERY 6 HOURS AS NEEDED FOR moderate PAIN 30 tablet 0   zinc gluconate 50 MG tablet Take 50 mg by mouth daily.     No current facility-administered medications on file prior to visit.   No Known Allergies Social History   Socioeconomic History   Marital status: Single    Spouse name: Not on file   Number of children: Not on file   Years of education: Not on file   Highest education level: Not on file  Occupational History   Occupation: retired  Tobacco Use   Smoking status: Former    Packs/day: 1.00    Years: 40.00    Pack years: 40.00    Types: Cigarettes   Smokeless tobacco: Never   Tobacco comments:    declines patch  Vaping Use   Vaping Use: Never used  Substance and Sexual Activity    Alcohol use: Yes    Alcohol/week: 30.0 standard drinks    Types: 30 Cans of beer per week    Comment: h/o heavy use, now 4 beers per day   Drug use: Not Currently   Sexual activity: Yes  Other Topics Concern  Not on file  Social History Narrative   Right Handed   Lives in one story home       Patient has not smoked for 3 months   Patient on 2L of Oxygen    Social Determinants of Health   Financial Resource Strain: Medium Risk   Difficulty of Paying Living Expenses: Somewhat hard  Food Insecurity: No Food Insecurity   Worried About Charity fundraiser in the Last Year: Never true   Ran Out of Food in the Last Year: Never true  Transportation Needs: No Transportation Needs   Lack of Transportation (Medical): No   Lack of Transportation (Non-Medical): No  Physical Activity: Inactive   Days of Exercise per Week: 0 days   Minutes of Exercise per Session: 0 min  Stress: Not on file  Social Connections: Not on file  Intimate Partner Violence: Not on file      Review of Systems  All other systems reviewed and are negative.     Objective:   Physical Exam Vitals reviewed.  Constitutional:      General: He is not in acute distress.    Appearance: He is not toxic-appearing.  Cardiovascular:     Rate and Rhythm: Normal rate and regular rhythm.     Heart sounds: Normal heart sounds. No murmur heard.   No friction rub. No gallop.  Pulmonary:     Effort: Pulmonary effort is normal.     Breath sounds: Decreased air movement present. Decreased breath sounds and wheezing present.  Abdominal:     General: Abdomen is flat. Bowel sounds are normal.     Palpations: Abdomen is soft.  Musculoskeletal:     Right lower leg: No edema.     Left lower leg: No edema.  Neurological:     General: No focal deficit present.     Mental Status: He is alert and oriented to person, place, and time. Mental status is at baseline.     Cranial Nerves: No cranial nerve deficit.           Assessment & Plan:  COPD exacerbation ([place]) Patient COPD exacerbation has improved.  He is back to his baseline.  Patient benefits dramatically from oxygen.  He must continue 2 L via nasal cannula continuously.  He is unable to stop oxygen.  He cannot tolerate discontinuing oxygen even for short periods of time due to hypoxia and shortness of breath.  He would benefit from a portable oxygen concentrator is essentially now he is "tethered" to a large oxygen tank which prevents him from leaving his home.  He would like to try to get approved for an oxygen concentrator.  He will continue Breztri 2 puffs inhaled twice daily as maintenance therapy and use albuterol every 4-6 hours as needed.  Reassess in 3 months or sooner if worsening

## 2021-03-10 ENCOUNTER — Telehealth: Payer: Self-pay

## 2021-03-10 NOTE — Telephone Encounter (Signed)
Pt sgo called in  to let dr/nurse know that he is almost out of the new prescription of Eloquis and is having trouble purchasing more as this med is too expensive. Pt states if there another type of med than he can take that would cost less he would like to get that instead of Eloquis. Please advise  Cb#: [phone]

## 2021-03-10 NOTE — Telephone Encounter (Signed)
Zachary Chambers, patient SO states that she is working on completing prescription assistance for Eliquis.   Please advise.

## 2021-03-11 ENCOUNTER — Emergency Department (HOSPITAL_COMMUNITY): Payer: Medicare HMO

## 2021-03-11 ENCOUNTER — Telehealth: Payer: Self-pay | Admitting: *Deleted

## 2021-03-11 ENCOUNTER — Inpatient Hospital Stay (HOSPITAL_COMMUNITY)
Admission: EM | Admit: 2021-03-11 | Discharge: 2021-03-16 | DRG: 064 | Disposition: A | Discharge status: Home Health | Payer: Medicare HMO | Attending: Internal Medicine | Admitting: Internal Medicine

## 2021-03-11 ENCOUNTER — Other Ambulatory Visit: Payer: Self-pay

## 2021-03-11 DIAGNOSIS — R778 Other specified abnormalities of plasma proteins: Secondary | ICD-10-CM | POA: Diagnosis present

## 2021-03-11 DIAGNOSIS — E876 Hypokalemia: Secondary | ICD-10-CM

## 2021-03-11 DIAGNOSIS — Z9001 Acquired absence of eye: Secondary | ICD-10-CM

## 2021-03-11 DIAGNOSIS — R443 Hallucinations, unspecified: Secondary | ICD-10-CM | POA: Diagnosis not present

## 2021-03-11 DIAGNOSIS — G9349 Other encephalopathy: Secondary | ICD-10-CM | POA: Diagnosis present

## 2021-03-11 DIAGNOSIS — I4891 Unspecified atrial fibrillation: Secondary | ICD-10-CM | POA: Diagnosis present

## 2021-03-11 DIAGNOSIS — U071 COVID-19: Secondary | ICD-10-CM | POA: Diagnosis not present

## 2021-03-11 DIAGNOSIS — I25110 Atherosclerotic heart disease of native coronary artery with unstable angina pectoris: Secondary | ICD-10-CM | POA: Diagnosis not present

## 2021-03-11 DIAGNOSIS — J9621 Acute and chronic respiratory failure with hypoxia: Secondary | ICD-10-CM | POA: Diagnosis not present

## 2021-03-11 DIAGNOSIS — Z8673 Personal history of transient ischemic attack (TIA), and cerebral infarction without residual deficits: Secondary | ICD-10-CM

## 2021-03-11 DIAGNOSIS — Y92009 Unspecified place in unspecified non-institutional (private) residence as the place of occurrence of the external cause: Secondary | ICD-10-CM

## 2021-03-11 DIAGNOSIS — Z20822 Contact with and (suspected) exposure to covid-19: Secondary | ICD-10-CM | POA: Diagnosis present

## 2021-03-11 DIAGNOSIS — Z8041 Family history of malignant neoplasm of ovary: Secondary | ICD-10-CM | POA: Diagnosis not present

## 2021-03-11 DIAGNOSIS — R000 Tachycardia, unspecified: Secondary | ICD-10-CM | POA: Diagnosis not present

## 2021-03-11 DIAGNOSIS — Z87891 Personal history of nicotine dependence: Secondary | ICD-10-CM

## 2021-03-11 DIAGNOSIS — Z9181 History of falling: Secondary | ICD-10-CM

## 2021-03-11 DIAGNOSIS — I6389 Other cerebral infarction: Principal | ICD-10-CM | POA: Diagnosis present

## 2021-03-11 DIAGNOSIS — E785 Hyperlipidemia, unspecified: Secondary | ICD-10-CM | POA: Diagnosis present

## 2021-03-11 DIAGNOSIS — K219 Gastro-esophageal reflux disease without esophagitis: Secondary | ICD-10-CM | POA: Diagnosis not present

## 2021-03-11 DIAGNOSIS — M6281 Muscle weakness (generalized): Secondary | ICD-10-CM | POA: Diagnosis not present

## 2021-03-11 DIAGNOSIS — R4182 Altered mental status, unspecified: Secondary | ICD-10-CM | POA: Diagnosis not present

## 2021-03-11 DIAGNOSIS — I471 Supraventricular tachycardia: Secondary | ICD-10-CM | POA: Diagnosis present

## 2021-03-11 DIAGNOSIS — Z8711 Personal history of peptic ulcer disease: Secondary | ICD-10-CM

## 2021-03-11 DIAGNOSIS — R079 Chest pain, unspecified: Secondary | ICD-10-CM | POA: Diagnosis present

## 2021-03-11 DIAGNOSIS — G9341 Metabolic encephalopathy: Secondary | ICD-10-CM | POA: Diagnosis not present

## 2021-03-11 DIAGNOSIS — Z7901 Long term (current) use of anticoagulants: Secondary | ICD-10-CM

## 2021-03-11 DIAGNOSIS — R0600 Dyspnea, unspecified: Secondary | ICD-10-CM | POA: Diagnosis not present

## 2021-03-11 DIAGNOSIS — I4892 Unspecified atrial flutter: Secondary | ICD-10-CM | POA: Diagnosis present

## 2021-03-11 DIAGNOSIS — Z9981 Dependence on supplemental oxygen: Secondary | ICD-10-CM

## 2021-03-11 DIAGNOSIS — Z7982 Long term (current) use of aspirin: Secondary | ICD-10-CM | POA: Diagnosis not present

## 2021-03-11 DIAGNOSIS — J1282 Pneumonia due to coronavirus disease 2019: Secondary | ICD-10-CM | POA: Diagnosis not present

## 2021-03-11 DIAGNOSIS — I639 Cerebral infarction, unspecified: Secondary | ICD-10-CM | POA: Diagnosis not present

## 2021-03-11 DIAGNOSIS — J441 Chronic obstructive pulmonary disease with (acute) exacerbation: Secondary | ICD-10-CM | POA: Diagnosis present

## 2021-03-11 DIAGNOSIS — J9610 Chronic respiratory failure, unspecified whether with hypoxia or hypercapnia: Secondary | ICD-10-CM | POA: Diagnosis present

## 2021-03-11 DIAGNOSIS — J9611 Chronic respiratory failure with hypoxia: Secondary | ICD-10-CM | POA: Diagnosis present

## 2021-03-11 DIAGNOSIS — R441 Visual hallucinations: Secondary | ICD-10-CM | POA: Diagnosis present

## 2021-03-11 DIAGNOSIS — Z79899 Other long term (current) drug therapy: Secondary | ICD-10-CM

## 2021-03-11 DIAGNOSIS — R0602 Shortness of breath: Secondary | ICD-10-CM | POA: Diagnosis not present

## 2021-03-11 DIAGNOSIS — Z800 Family history of malignant neoplasm of digestive organs: Secondary | ICD-10-CM

## 2021-03-11 DIAGNOSIS — I252 Old myocardial infarction: Secondary | ICD-10-CM

## 2021-03-11 DIAGNOSIS — J449 Chronic obstructive pulmonary disease, unspecified: Secondary | ICD-10-CM | POA: Diagnosis present

## 2021-03-11 DIAGNOSIS — I480 Paroxysmal atrial fibrillation: Secondary | ICD-10-CM | POA: Diagnosis present

## 2021-03-11 DIAGNOSIS — Z8616 Personal history of COVID-19: Secondary | ICD-10-CM | POA: Diagnosis not present

## 2021-03-11 DIAGNOSIS — W19XXXA Unspecified fall, initial encounter: Secondary | ICD-10-CM | POA: Diagnosis present

## 2021-03-11 DIAGNOSIS — R059 Cough, unspecified: Secondary | ICD-10-CM | POA: Diagnosis not present

## 2021-03-11 DIAGNOSIS — Z825 Family history of asthma and other chronic lower respiratory diseases: Secondary | ICD-10-CM

## 2021-03-11 DIAGNOSIS — Z955 Presence of coronary angioplasty implant and graft: Secondary | ICD-10-CM

## 2021-03-11 DIAGNOSIS — I2510 Atherosclerotic heart disease of native coronary artery without angina pectoris: Secondary | ICD-10-CM | POA: Diagnosis present

## 2021-03-11 DIAGNOSIS — Z833 Family history of diabetes mellitus: Secondary | ICD-10-CM

## 2021-03-11 DIAGNOSIS — R296 Repeated falls: Secondary | ICD-10-CM | POA: Diagnosis present

## 2021-03-11 HISTORY — DX: COVID-19: U07.1

## 2021-03-11 HISTORY — DX: Chronic respiratory failure, unspecified whether with hypoxia or hypercapnia: J96.10

## 2021-03-11 HISTORY — DX: Atherosclerotic heart disease of native coronary artery without angina pectoris: I25.10

## 2021-03-11 HISTORY — DX: Cerebral infarction, unspecified: I63.9

## 2021-03-11 HISTORY — DX: Alcohol abuse, uncomplicated: F10.10

## 2021-03-11 HISTORY — DX: Pneumonia due to coronavirus disease 2019: J12.82

## 2021-03-11 HISTORY — DX: Atherosclerosis of aorta: I70.0

## 2021-03-11 HISTORY — DX: Dependence on supplemental oxygen: Z99.81

## 2021-03-11 HISTORY — DX: Other pericardial effusion (noninflammatory): I31.39

## 2021-03-11 HISTORY — DX: Pericardial effusion (noninflammatory): I31.3

## 2021-03-11 LAB — CBC WITH DIFFERENTIAL/PLATELET
Abs Immature Granulocytes: 0.23 10*3/uL — ABNORMAL HIGH (ref 0.00–0.07)
Basophils Absolute: 0.1 10*3/uL (ref 0.0–0.1)
Basophils Relative: 1 %
Eosinophils Absolute: 0.1 10*3/uL (ref 0.0–0.5)
Eosinophils Relative: 1 %
HCT: 51.7 % (ref 39.0–52.0)
Hemoglobin: 16.4 g/dL (ref 13.0–17.0)
Immature Granulocytes: 3 %
Lymphocytes Relative: 9 %
Lymphs Abs: 0.8 10*3/uL (ref 0.7–4.0)
MCH: 26.6 pg (ref 26.0–34.0)
MCHC: 31.7 g/dL (ref 30.0–36.0)
MCV: 83.8 fL (ref 80.0–100.0)
Monocytes Absolute: 1.2 10*3/uL — ABNORMAL HIGH (ref 0.1–1.0)
Monocytes Relative: 14 %
Neutro Abs: 5.8 10*3/uL (ref 1.7–7.7)
Neutrophils Relative %: 72 %
Platelets: 318 10*3/uL (ref 150–400)
RBC: 6.17 MIL/uL — ABNORMAL HIGH (ref 4.22–5.81)
RDW: 19.6 % — ABNORMAL HIGH (ref 11.5–15.5)
WBC: 8.1 10*3/uL (ref 4.0–10.5)
nRBC: 0 % (ref 0.0–0.2)

## 2021-03-11 LAB — COMPREHENSIVE METABOLIC PANEL
ALT: 63 U/L — ABNORMAL HIGH (ref 0–44)
AST: 61 U/L — ABNORMAL HIGH (ref 15–41)
Albumin: 4.3 g/dL (ref 3.5–5.0)
Alkaline Phosphatase: 101 U/L (ref 38–126)
Anion gap: 10 (ref 5–15)
BUN: 17 mg/dL (ref 8–23)
CO2: 31 mmol/L (ref 22–32)
Calcium: 9.2 mg/dL (ref 8.9–10.3)
Chloride: 97 mmol/L — ABNORMAL LOW (ref 98–111)
Creatinine, Ser: 0.85 mg/dL (ref 0.61–1.24)
GFR, Estimated: >60 mL/min (ref >60)
Glucose, Bld: 127 mg/dL — ABNORMAL HIGH (ref 70–99)
Potassium: 3.3 mmol/L — ABNORMAL LOW (ref 3.5–5.1)
Sodium: 138 mmol/L (ref 135–145)
Total Bilirubin: 1.1 mg/dL (ref 0.3–1.2)
Total Protein: 7.5 g/dL (ref 6.5–8.1)

## 2021-03-11 LAB — BLOOD GAS, ARTERIAL
Acid-Base Excess: 5.1 mmol/L — ABNORMAL HIGH (ref 0.0–2.0)
Bicarbonate: 28.2 mmol/L — ABNORMAL HIGH (ref 20.0–28.0)
FIO2: 28
O2 Saturation: 94.2 %
Patient temperature: 37
pCO2 arterial: 47.8 mmHg (ref 32.0–48.0)
pH, Arterial: 7.409 (ref 7.350–7.450)
pO2, Arterial: 75.7 mmHg — ABNORMAL LOW (ref 83.0–108.0)

## 2021-03-11 LAB — ETHANOL: Alcohol, Ethyl (B): <10 mg/dL (ref <10)

## 2021-03-11 LAB — CBG MONITORING, ED: Glucose-Capillary: 105 mg/dL — ABNORMAL HIGH (ref 70–99)

## 2021-03-11 LAB — AMMONIA: Ammonia: 14 umol/L (ref 9–35)

## 2021-03-11 MED ORDER — IPRATROPIUM-ALBUTEROL 0.5-2.5 (3) MG/3ML IN SOLN
3.0000 mL | Freq: Once | RESPIRATORY_TRACT | Status: AC
  Administered 2021-03-12: 3 mL via RESPIRATORY_TRACT
  Filled 2021-03-11: qty 3

## 2021-03-11 MED ORDER — METHYLPREDNISOLONE SODIUM SUCC 125 MG IJ SOLR
80.0000 mg | Freq: Once | INTRAMUSCULAR | Status: AC
  Administered 2021-03-12: 80 mg via INTRAVENOUS
  Filled 2021-03-11: qty 2

## 2021-03-11 MED ORDER — ALBUTEROL SULFATE HFA 108 (90 BASE) MCG/ACT IN AERS
INHALATION_SPRAY | RESPIRATORY_TRACT | Status: AC
  Administered 2021-03-11: 4
  Filled 2021-03-11: qty 6.7

## 2021-03-11 NOTE — ED Triage Notes (Signed)
Pt. States "something is messed up with their system". Per pt. They started on a new job site and have been having hallucinations.

## 2021-03-11 NOTE — ED Provider Notes (Signed)
[facility] EMERGENCY DEPARTMENT Provider Note   CSN: [id number] Arrival date & time: 03/11/21  1813     History Chief Complaint  Patient presents with   Hallucinations    Zachary Chambers is a 70 y.o. male.  Patient here with a 4 to 5-day history of visual and auditory hallucinations.  Wife at bedside states he is seeing people in the room who are not there and having full conversations with them.  They are not there currently.  This is never happened before.  Patient states he is seeing people that are not there and the wife says he is having full conversations with people in the room.  He denies any suicidal thoughts or homicidal thoughts.  No recent changes to his medications.  Has been out of his Eliquis for the past 2 days.  No history of psychiatric illness.  No illicit drug or alcohol use.  He is having increased difficulty breathing at home as well as chest pain.  States his COPD has been acting up and he is intermittent chest pain lasting for 15 to 20 minutes at a time that comes and goes.  Cough is at baseline.  No fever.  No abdominal pain, nausea or vomiting.  Has had several falls including yesterday.  Is uncertain if he hit his head. Level 5 caveat for altered mental status Has been increasingly ornery at home and agitated. Past medical history includes COPD on home oxygen, previous MI, A. fib on Eliquis, rotator cuff disease and previous ulcers  The history is provided by the patient and a relative.      Past Medical History:  Diagnosis Date   COPD (chronic obstructive pulmonary disease) ([place])    not on home O2   DJD (degenerative joint disease)    diffusely   GERD (gastroesophageal reflux disease)    Hyperlipidemia    MI (myocardial infarction) ([place]) 2006   small MI and stents x 3  Dr. Doreatha Lew   Paroxysmal atrial fibrillation ([place])    PUD (peptic ulcer disease)    with bleeding   Rotator cuff disorder    has been evaluated by Dr Clifton James and [facility]    Patient  Active Problem List   Diagnosis Date Noted   Hypoxemia 12/10/2020   COPD exacerbation ([place]) 12/09/2020   Acute on chronic respiratory failure with hypoxia ([place]) 12/09/2020   Demand ischemia ([place]) 12/09/2020   Anxiety 10/07/2020   History of CVA with residual deficit 10/07/2020   Pneumonia due to COVID-19 virus 07/05/2020   Chronic respiratory failure ([place]) 07/05/2020   Transient atrial fibrillation ([place]) 07/05/2020   Dyspnea 07/04/2020   Protein-calorie malnutrition, severe 03/07/2020   S/P coronary artery stent placement    Chest pain 03/06/2020   Dyslipidemia 03/06/2020   Healthcare maintenance 03/05/2020   Heavy alcohol use 03/04/2018   Memory changes 03/04/2018   Leukopenia 05/04/2017   Bruit 01/22/2014   Muscle spasm of back 09/20/2013   COPD (chronic obstructive pulmonary disease) ([place]) 01/14/2011   CAD (coronary artery disease) 01/14/2011   GERD (gastroesophageal reflux disease) 01/14/2011   TOBACCO ABUSE 07/25/2009   Osteoarthritis 07/25/2009   ROTATOR CUFF SYNDROME, RIGHT 07/25/2009    Past Surgical History:  Procedure Laterality Date   COLONOSCOPY     EYE SURGERY     right eye removed at age 67   heart stent     x3- 2006       Family History  Problem Relation Age of Onset  Ovarian cancer Mother    Diabetes Maternal Grandfather    COPD Father    Diabetes Paternal Grandfather    Colon cancer Paternal Grandfather    Esophageal cancer Neg Hx    Rectal cancer Neg Hx    Stomach cancer Neg Hx     Social History   Tobacco Use   Smoking status: Former    Packs/day: 1.00    Years: 40.00    Pack years: 40.00    Types: Cigarettes   Smokeless tobacco: Never   Tobacco comments:    declines patch  Vaping Use   Vaping Use: Never used  Substance Use Topics   Alcohol use: Yes    Alcohol/week: 30.0 standard drinks    Types: 30 Cans of beer per week    Comment: h/o heavy use, now 4 beers per day   Drug use: Not Currently    Home Medications Prior to  Admission medications   Medication Sig Start Date End Date Taking? Authorizing Provider  albuterol (PROVENTIL) (2.5 MG/3ML) 0.083% nebulizer solution Take 3 mLs (2.5 mg total) by nebulization every 4 (four) hours as needed for wheezing or shortness of breath. 01/17/19   Parrett, Fonnie Mu, NP  albuterol (VENTOLIN HFA) 108 (90 Base) MCG/ACT inhaler INHALE 2 PUFFS INTO THE LUNGS EVERY 4 HOURS AS NEEDED FOR WHEEZING AND SHORTNESS OF BREATH 12/25/20   [name], MD  apixaban (ELIQUIS) 5 MG TABS tablet Take 1 tablet (5 mg total) by mouth 2 (two) times daily. 01/23/21   [name], MD  aspirin 81 MG tablet Take 81 mg by mouth at bedtime.    [provider]  atorvastatin (LIPITOR) 40 MG tablet Take 1 tablet (40 mg total) by mouth at bedtime. 10/14/20   [name], MD  Budeson-Glycopyrrol-Formoterol (BREZTRI AEROSPHERE) 160-9-4.8 MCG/ACT AERO Inhale 2 puffs into the lungs 2 (two) times daily. 01/29/21   [name], MD  Cyanocobalamin (VITAMIN B-12 PO) Take 1 tablet by mouth daily.    [provider]  fluticasone (FLONASE) 50 MCG/ACT nasal spray USE 2 SPRAYS in EACH NOSTRIL EVERY DAY Patient taking differently: Place 2 sprays into both nostrils daily. 04/17/20   [name], MD  folic acid (FOLVITE) 1 MG tablet TAKE 1 TABLET BY MOUTH EVERY DAY Please make appointment WITH new pcp 02/03/21   [name], MD  LORazepam (ATIVAN) 0.5 MG tablet Take 1 tablet (0.5 mg total) by mouth 2 (two) times daily as needed for anxiety. 10/14/20   [name], MD  Multiple Vitamin (MULTIVITAMIN WITH MINERALS) TABS tablet Take 1 tablet by mouth daily. 03/09/20   Kayleen Memos, DO  Niacin (VITAMIN B-3 PO) Take 1 tablet by mouth daily.    [provider]  nitroGLYCERIN (NITROSTAT) 0.4 MG SL tablet dissolve 1 TABLET UNDER THE TONGUE every 5 minutes AS NEEDED CHEST PAIN 09/12/20   [name], MD  omeprazole (PRILOSEC) 40 MG capsule Take 1 capsule (40 mg total) by  mouth daily. 10/14/20   [name], MD  traMADol (ULTRAM) 50 MG tablet TAKE 1 TABLET BY MOUTH EVERY 6 HOURS AS NEEDED FOR moderate PAIN 07/07/19   [name], MD  zinc gluconate 50 MG tablet Take 50 mg by mouth daily.    [provider]    Allergies    Patient has no known allergies.  Review of Systems   Review of Systems  Constitutional:  Negative for activity change, appetite change and fatigue.  HENT:  Negative for congestion.   Respiratory:  Positive for chest tightness and shortness of breath.   Cardiovascular:  Negative for chest pain.  Gastrointestinal:  Negative for abdominal pain and nausea.  Genitourinary:  Negative for dysuria and hematuria.  Musculoskeletal:  Negative for myalgias.  Skin:  Negative for rash.  Neurological:  Positive for headaches. Negative for dizziness and weakness.  Psychiatric/Behavioral:  Positive for agitation, behavioral problems, dysphoric mood and hallucinations. Negative for suicidal ideas.    Physical Exam Updated Vital Signs BP (!) 153/80 (BP Location: Right Arm)   Pulse 94   Temp 98.2 F (36.8 C) (Oral)   Resp 18   Ht '5\' 9"'$  (1.753 m)   Wt 54.9 kg   SpO2 94%   BMI 17.87 kg/m   Physical Exam Vitals and nursing note reviewed.  Constitutional:      General: He is in acute distress.     Appearance: He is well-developed.     Comments: Moderate increased work of breathing, moist cough  HENT:     Head: Normocephalic and atraumatic.     Mouth/Throat:     Pharynx: No oropharyngeal exudate.  Eyes:     Conjunctiva/sclera: Conjunctivae normal.     Comments: Right eye prosthesis  Neck:     Comments: No meningismus. Cardiovascular:     Rate and Rhythm: Normal rate and regular rhythm.     Heart sounds: Normal heart sounds. No murmur heard. Pulmonary:     Effort: Respiratory distress present.     Breath sounds: Wheezing present.     Comments: Diminished breath sounds with expiratory wheezing bilateral Abdominal:      Palpations: Abdomen is soft.     Tenderness: There is no abdominal tenderness. There is no guarding or rebound.  Musculoskeletal:        General: No tenderness. Normal range of motion.     Cervical back: Normal range of motion and neck supple.  Skin:    General: Skin is warm.  Neurological:     Mental Status: He is alert.     Cranial Nerves: No cranial nerve deficit.     Motor: No abnormal muscle tone.     Coordination: Coordination normal.     Comments: Difficulty raising right arm secondary to previous rotator cuff surgery.  No facial droop.  Tongue is midline.  5/5 strength throughout, follows commands. Oriented x3  Psychiatric:        Behavior: Behavior normal.    ED Results / Procedures / Treatments   Labs (all labs ordered are listed, but only abnormal results are displayed) Labs Reviewed  COMPREHENSIVE METABOLIC PANEL - Abnormal; Notable for the following components:      Result Value   Potassium 3.3 (*)    Chloride 97 (*)    Glucose, Bld 127 (*)    AST 61 (*)    ALT 63 (*)    All other components within normal limits  CBC WITH DIFFERENTIAL/PLATELET - Abnormal; Notable for the following components:   RBC 6.17 (*)    RDW 19.6 (*)    Monocytes Absolute 1.2 (*)    Abs Immature Granulocytes 0.23 (*)    All other components within normal limits  RAPID URINE DRUG SCREEN, HOSP PERFORMED - Abnormal; Notable for the following components:   Benzodiazepines POSITIVE (*)    All other components within normal limits  URINALYSIS, ROUTINE W REFLEX MICROSCOPIC - Abnormal; Notable for the following components:   Color, Urine AMBER (*)  Protein, ur 100 (*)    All other components within normal limits  BLOOD GAS, ARTERIAL - Abnormal; Notable for the following components:   pO2, Arterial 75.7 (*)    Bicarbonate 28.2 (*)    Acid-Base Excess 5.1 (*)    Allens test (pass/fail) BRACHIAL ARTERY (*)    All other components within normal limits  BRAIN NATRIURETIC PEPTIDE - Abnormal;  Notable for the following components:   B Natriuretic Peptide 466.0 (*)    All other components within normal limits  CBC - Abnormal; Notable for the following components:   RDW 19.2 (*)    All other components within normal limits  COMPREHENSIVE METABOLIC PANEL - Abnormal; Notable for the following components:   Potassium 3.2 (*)    Chloride 97 (*)    Glucose, Bld 148 (*)    Calcium 8.8 (*)    AST 55 (*)    ALT 58 (*)    All other components within normal limits  MAGNESIUM - Abnormal; Notable for the following components:   Magnesium 1.6 (*)    All other components within normal limits  CBG MONITORING, ED - Abnormal; Notable for the following components:   Glucose-Capillary 105 (*)    All other components within normal limits  TROPONIN I (HIGH SENSITIVITY) - Abnormal; Notable for the following components:   Troponin I (High Sensitivity) 443 (*)    All other components within normal limits  TROPONIN I (HIGH SENSITIVITY) - Abnormal; Notable for the following components:   Troponin I (High Sensitivity) 534 (*)    All other components within normal limits  TROPONIN I (HIGH SENSITIVITY) - Abnormal; Notable for the following components:   Troponin I (High Sensitivity) 645 (*)    All other components within normal limits  TROPONIN I (HIGH SENSITIVITY) - Abnormal; Notable for the following components:   Troponin I (High Sensitivity) 577 (*)    All other components within normal limits  RESP PANEL BY RT-PCR (FLU A&B, COVID) ARPGX2  URINE CULTURE  ETHANOL  AMMONIA  TSH  LIPID PANEL    EKG EKG Interpretation  Date/Time:  Tuesday March 11 2021 23:40:03 EDT Ventricular Rate:  90 PR Interval:  118 QRS Duration: 76 QT Interval:  320 QTC Calculation: 392 R Axis:   66 Text Interpretation: Sinus rhythm Borderline short PR interval No significant change was found Confirmed by Ezequiel Essex [id number]) on 03/11/2021 11:53:29 PM  Radiology CT Head Wo Contrast  Result Date:  03/11/2021 CLINICAL DATA:  Altered mental status. EXAM: CT HEAD WITHOUT CONTRAST TECHNIQUE: Contiguous axial images were obtained from the base of the skull through the vertex without intravenous contrast. COMPARISON:  October 04, 2020 FINDINGS: Brain: There is mild cerebral atrophy with widening of the extra-axial spaces and ventricular dilatation. There are areas of decreased attenuation within the white matter tracts of the supratentorial brain, consistent with microvascular disease changes. Small, chronic bilateral basal ganglia lacunar infarcts are seen. Vascular: No hyperdense vessel or unexpected calcification. Skull: Normal. Negative for fracture or focal lesion. 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 Other: None. IMPRESSION: 1. No acute intracranial abnormality. 2. Generalized cerebral atrophy. Electronically Signed   By: Virgina Norfolk M.D.   On: 03/11/2021 21:17   DG Chest Portable 1 View  Result Date: 03/11/2021 CLINICAL DATA:  Cough and shortness of breath x 4 days. EXAM: PORTABLE CHEST 1 VIEW COMPARISON:  December 09, 2020 FINDINGS: Mild linear scarring is seen within the bilateral lung bases. There is no evidence of a  pleural effusion or pneumothorax. The heart size and mediastinal contours are within normal limits. The visualized skeletal structures are unremarkable. IMPRESSION: Stable exam without active cardiopulmonary disease. Electronically Signed   By: Virgina Norfolk M.D.   On: 03/11/2021 21:19    Procedures .Critical Care  Date/Time: 03/12/2021 10:32 AM Performed by: Ezequiel Essex, MD Authorized by: Ezequiel Essex, MD   Critical care provider statement:    Critical care time (minutes):  45   Critical care was necessary to treat or prevent imminent or life-threatening deterioration of the following conditions:  CNS failure or compromise   Critical care was time spent personally by me on the following activities:  Discussions with consultants, evaluation of  patient's response to treatment, examination of patient, ordering and performing treatments and interventions, ordering and review of laboratory studies, ordering and review of radiographic studies, pulse oximetry, re-evaluation of patient's condition, obtaining history from patient or surrogate and review of old charts   Medications Ordered in ED Medications  ipratropium-albuterol (DUONEB) 0.5-2.5 (3) MG/3ML nebulizer solution 3 mL (has no administration in time range)  methylPREDNISolone sodium succinate (SOLU-MEDROL) 125 mg/2 mL injection 80 mg (has no administration in time range)    ED Course  I have reviewed the triage vital signs and the nursing notes.  Pertinent labs & imaging results that were available during my care of the patient were reviewed by me and considered in my medical decision making (see chart for details).    MDM Rules/Calculators/A&P                          4 to 5 days of hallucinations, increased difficulty breathing, confusion and recurrent falls.  No hypoxia on home oxygen.  Does have wheezing and increased work of breathing.  CT head is negative, chest x-ray is negative.  EKG without acute ST changes.  He did have a brief run of narrow complex tachycardia in the 160 range appearing to be consistent with atrial flutter versus SVT.  He converted back on his own to sinus rhythm.  Troponin slightly elevated at 443.  Did have elevated troponins in April but no catheterization.  States he does not have any chest pain currently. LFTs and lipase are stable.  Creatinine is stable. ASA given.  Unclear etiology of hallucinations.  Urinalysis and drug screen still pending.  No CO2 retention on ABG. CT head stable. No obvious evidence of infection.  Admission d/w Dr. Clearence Ped. MRI in AM, serial troponins, cardiology evaluation.  Final Clinical Impression(s) / ED Diagnoses Final diagnoses:  Hallucinations  Elevated troponin  COPD exacerbation [facility])    Rx / DC  Orders ED Discharge Orders     None        Edgel Degnan, Annie Main, MD 03/12/21 [phone]

## 2021-03-11 NOTE — Telephone Encounter (Signed)
Received call from patient Zachary Chambers, [name].   Reports that patient is confused, having visual and auditory hallucinations. States that he is also having incontinent episodes.   Advised that patient requires evaluation. Reports that patient is refusing to go to [facility] and is screaming in background "nothing is wrong with me".   Advised to have EMS called to attempt to transport.   Verbalized understanding.

## 2021-03-11 NOTE — Telephone Encounter (Signed)
Hey I think we are already looking into this for him.  I will follow up.

## 2021-03-11 NOTE — ED Provider Notes (Signed)
Emergency Medicine Provider Triage Evaluation Note  Zachary Chambers , a 70 y.o. male  was evaluated in triage.  Pt complains of auditory and visual hallucinations.  These hallucinations have been present over the last 4 days.  Patient reports that he hears people talking to him.  Patient also reports that he sees children.  Patient has had no change to his medications.  No reported past medical history of schizophrenia.  Denies any illicit drug use or alcohol use.  Patient did suffer a fall yesterday.   Review of Systems  Positive: Visual hallucinations, auditory hallucinations, cough Negative: Suicidal ideations, homicidal ideations, fever chills   Physical Exam  BP (!) 153/80 (BP Location: Right Arm)   Pulse 94   Temp 98.2 F (36.8 C) (Oral)   Resp 18   Ht '5\' 9"'$  (1.753 m)   Wt 54.9 kg   SpO2 94%   BMI 17.87 kg/m  Gen:   Awake, no distress   Resp:  Normal effort  MSK:   Moves extremities without difficulty  Other:  Patient is on oxygen via nasal cannula, on oxygen continuously due to COPD  Medical Decision Making  Medically screening exam initiated at 8:31 PM.  Appropriate orders placed.  Damien T Renna was informed that the remainder of the evaluation will be completed by another provider, this initial triage assessment does not replace that evaluation, and the importance of remaining in the ED until their evaluation is complete.  The patient appears stable so that the remainder of the work up may be completed by another provider.      Dyann Ruddle 03/11/21 2033    Fredia Sorrow, MD 03/14/21 [phone]

## 2021-03-11 NOTE — Telephone Encounter (Signed)
Call placed to drug rep and samples requested.   Will place at front desk for pick up.   Call placed to patient and patient SO Zachary Chambers made aware.

## 2021-03-12 ENCOUNTER — Observation Stay (HOSPITAL_BASED_OUTPATIENT_CLINIC_OR_DEPARTMENT_OTHER): Payer: Medicare HMO

## 2021-03-12 ENCOUNTER — Observation Stay (HOSPITAL_COMMUNITY): Payer: Medicare HMO

## 2021-03-12 ENCOUNTER — Encounter (HOSPITAL_COMMUNITY): Payer: Self-pay | Admitting: Family Medicine

## 2021-03-12 DIAGNOSIS — G9341 Metabolic encephalopathy: Secondary | ICD-10-CM

## 2021-03-12 DIAGNOSIS — I25110 Atherosclerotic heart disease of native coronary artery with unstable angina pectoris: Secondary | ICD-10-CM | POA: Diagnosis not present

## 2021-03-12 DIAGNOSIS — I4891 Unspecified atrial fibrillation: Secondary | ICD-10-CM

## 2021-03-12 DIAGNOSIS — R778 Other specified abnormalities of plasma proteins: Secondary | ICD-10-CM

## 2021-03-12 DIAGNOSIS — E876 Hypokalemia: Secondary | ICD-10-CM

## 2021-03-12 DIAGNOSIS — R079 Chest pain, unspecified: Secondary | ICD-10-CM | POA: Diagnosis not present

## 2021-03-12 DIAGNOSIS — R443 Hallucinations, unspecified: Secondary | ICD-10-CM

## 2021-03-12 DIAGNOSIS — K219 Gastro-esophageal reflux disease without esophagitis: Secondary | ICD-10-CM

## 2021-03-12 DIAGNOSIS — I639 Cerebral infarction, unspecified: Secondary | ICD-10-CM | POA: Diagnosis not present

## 2021-03-12 DIAGNOSIS — J9611 Chronic respiratory failure with hypoxia: Secondary | ICD-10-CM

## 2021-03-12 LAB — URINALYSIS, ROUTINE W REFLEX MICROSCOPIC
Bacteria, UA: NONE SEEN
Bilirubin Urine: NEGATIVE
Glucose, UA: NEGATIVE mg/dL
Hgb urine dipstick: NEGATIVE
Ketones, ur: NEGATIVE mg/dL
Leukocytes,Ua: NEGATIVE
Nitrite: NEGATIVE
Protein, ur: 100 mg/dL — AB
Specific Gravity, Urine: 1.023 (ref 1.005–1.030)
pH: 6 (ref 5.0–8.0)

## 2021-03-12 LAB — ECHOCARDIOGRAM COMPLETE
AR max vel: 3.04 cm2
AV Area VTI: 2.97 cm2
AV Area mean vel: 3.26 cm2
AV Mean grad: 2 mmHg
AV Peak grad: 4.2 mmHg
Ao pk vel: 1.03 m/s
Area-P 1/2: 3.85 cm2
Height: 69 in
MV VTI: 2.33 cm2
S' Lateral: 2.29 cm
Weight: 1936 oz

## 2021-03-12 LAB — RAPID URINE DRUG SCREEN, HOSP PERFORMED
Amphetamines: NOT DETECTED
Barbiturates: NOT DETECTED
Benzodiazepines: POSITIVE — AB
Cocaine: NOT DETECTED
Opiates: NOT DETECTED
Tetrahydrocannabinol: NOT DETECTED

## 2021-03-12 LAB — LIPID PANEL
Cholesterol: 154 mg/dL (ref 0–200)
HDL: 55 mg/dL (ref >40)
LDL Cholesterol: 92 mg/dL (ref 0–99)
Total CHOL/HDL Ratio: 2.8 RATIO
Triglycerides: 35 mg/dL (ref <150)
VLDL: 7 mg/dL (ref 0–40)

## 2021-03-12 LAB — COMPREHENSIVE METABOLIC PANEL
ALT: 58 U/L — ABNORMAL HIGH (ref 0–44)
AST: 55 U/L — ABNORMAL HIGH (ref 15–41)
Albumin: 3.8 g/dL (ref 3.5–5.0)
Alkaline Phosphatase: 93 U/L (ref 38–126)
Anion gap: 10 (ref 5–15)
BUN: 15 mg/dL (ref 8–23)
CO2: 29 mmol/L (ref 22–32)
Calcium: 8.8 mg/dL — ABNORMAL LOW (ref 8.9–10.3)
Chloride: 97 mmol/L — ABNORMAL LOW (ref 98–111)
Creatinine, Ser: 0.73 mg/dL (ref 0.61–1.24)
GFR, Estimated: >60 mL/min (ref >60)
Glucose, Bld: 148 mg/dL — ABNORMAL HIGH (ref 70–99)
Potassium: 3.2 mmol/L — ABNORMAL LOW (ref 3.5–5.1)
Sodium: 136 mmol/L (ref 135–145)
Total Bilirubin: 1.2 mg/dL (ref 0.3–1.2)
Total Protein: 6.7 g/dL (ref 6.5–8.1)

## 2021-03-12 LAB — CBC
HCT: 47.6 % (ref 39.0–52.0)
Hemoglobin: 15.1 g/dL (ref 13.0–17.0)
MCH: 26.5 pg (ref 26.0–34.0)
MCHC: 31.7 g/dL (ref 30.0–36.0)
MCV: 83.5 fL (ref 80.0–100.0)
Platelets: 281 10*3/uL (ref 150–400)
RBC: 5.7 MIL/uL (ref 4.22–5.81)
RDW: 19.2 % — ABNORMAL HIGH (ref 11.5–15.5)
WBC: 7.3 10*3/uL (ref 4.0–10.5)
nRBC: 0 % (ref 0.0–0.2)

## 2021-03-12 LAB — RESP PANEL BY RT-PCR (FLU A&B, COVID) ARPGX2
Influenza A by PCR: NEGATIVE
Influenza B by PCR: NEGATIVE
SARS Coronavirus 2 by RT PCR: NEGATIVE

## 2021-03-12 LAB — TROPONIN I (HIGH SENSITIVITY)
Troponin I (High Sensitivity): 443 ng/L (ref <18)
Troponin I (High Sensitivity): 534 ng/L (ref <18)
Troponin I (High Sensitivity): 645 ng/L (ref <18)
Troponin I (High Sensitivity): 577 ng/L (ref <18)

## 2021-03-12 LAB — MAGNESIUM: Magnesium: 1.6 mg/dL — ABNORMAL LOW (ref 1.7–2.4)

## 2021-03-12 LAB — BRAIN NATRIURETIC PEPTIDE: B Natriuretic Peptide: 466 pg/mL — ABNORMAL HIGH (ref 0.0–100.0)

## 2021-03-12 LAB — TSH: TSH: 0.904 u[IU]/mL (ref 0.350–4.500)

## 2021-03-12 MED ORDER — ATORVASTATIN CALCIUM 40 MG PO TABS
40.0000 mg | ORAL_TABLET | Freq: Every day | ORAL | Status: DC
  Administered 2021-03-12 – 2021-03-15 (×4): 40 mg via ORAL
  Filled 2021-03-12 (×4): qty 1

## 2021-03-12 MED ORDER — BUDESON-GLYCOPYRROL-FORMOTEROL 160-9-4.8 MCG/ACT IN AERO: 2.0000 | INHALATION_SPRAY | Freq: Two times a day (BID) | RESPIRATORY_TRACT | Status: DC

## 2021-03-12 MED ORDER — APIXABAN 5 MG PO TABS
5.0000 mg | ORAL_TABLET | Freq: Two times a day (BID) | ORAL | Status: DC
  Administered 2021-03-12 – 2021-03-16 (×9): 5 mg via ORAL
  Filled 2021-03-12 (×9): qty 1

## 2021-03-12 MED ORDER — UMECLIDINIUM BROMIDE 62.5 MCG/INH IN AEPB
1.0000 | INHALATION_SPRAY | Freq: Every day | RESPIRATORY_TRACT | Status: DC
  Administered 2021-03-12 – 2021-03-16 (×5): 1 via RESPIRATORY_TRACT
  Filled 2021-03-12 (×2): qty 7

## 2021-03-12 MED ORDER — DILTIAZEM LOAD VIA INFUSION
15.0000 mg | Freq: Once | INTRAVENOUS | Status: DC
  Filled 2021-03-12: qty 15

## 2021-03-12 MED ORDER — FOLIC ACID 1 MG PO TABS
1.0000 mg | ORAL_TABLET | Freq: Every day | ORAL | Status: DC
  Administered 2021-03-12 – 2021-03-16 (×5): 1 mg via ORAL
  Filled 2021-03-12 (×5): qty 1

## 2021-03-12 MED ORDER — DILTIAZEM HCL-DEXTROSE 125-5 MG/125ML-% IV SOLN (PREMIX): 5.0000 mg/h | INTRAVENOUS | Status: DC

## 2021-03-12 MED ORDER — ONDANSETRON HCL 4 MG/2ML IJ SOLN: 4.0000 mg | Freq: Four times a day (QID) | INTRAMUSCULAR | Status: DC | PRN

## 2021-03-12 MED ORDER — VITAMIN B-12 100 MCG PO TABS
100.0000 ug | ORAL_TABLET | Freq: Every day | ORAL | Status: DC
  Administered 2021-03-12 – 2021-03-16 (×5): 100 ug via ORAL
  Filled 2021-03-12 (×5): qty 1

## 2021-03-12 MED ORDER — MOMETASONE FURO-FORMOTEROL FUM 100-5 MCG/ACT IN AERO
2.0000 | INHALATION_SPRAY | Freq: Two times a day (BID) | RESPIRATORY_TRACT | Status: DC
  Administered 2021-03-12 – 2021-03-16 (×9): 2 via RESPIRATORY_TRACT
  Filled 2021-03-12 (×2): qty 8.8

## 2021-03-12 MED ORDER — ACETAMINOPHEN 325 MG PO TABS: 650.0000 mg | ORAL_TABLET | ORAL | Status: DC | PRN

## 2021-03-12 MED ORDER — PANTOPRAZOLE SODIUM 40 MG PO TBEC
40.0000 mg | DELAYED_RELEASE_TABLET | Freq: Every day | ORAL | Status: DC
  Administered 2021-03-12 – 2021-03-16 (×5): 40 mg via ORAL
  Filled 2021-03-12 (×5): qty 1

## 2021-03-12 MED ORDER — ASPIRIN 81 MG PO CHEW
324.0000 mg | CHEWABLE_TABLET | Freq: Once | ORAL | Status: AC
  Administered 2021-03-12: 324 mg via ORAL
  Filled 2021-03-12: qty 4

## 2021-03-12 MED ORDER — THIAMINE HCL 100 MG PO TABS
500.0000 mg | ORAL_TABLET | Freq: Every day | ORAL | Status: DC
  Administered 2021-03-12 – 2021-03-16 (×5): 500 mg via ORAL
  Filled 2021-03-12 (×5): qty 5

## 2021-03-12 MED ORDER — ASPIRIN 81 MG PO CHEW
81.0000 mg | CHEWABLE_TABLET | Freq: Every day | ORAL | Status: DC
  Administered 2021-03-12 – 2021-03-15 (×4): 81 mg via ORAL
  Filled 2021-03-12 (×4): qty 1

## 2021-03-12 MED ORDER — MAGNESIUM SULFATE 2 GM/50ML IV SOLN
2.0000 g | Freq: Once | INTRAVENOUS | Status: AC
  Administered 2021-03-12: 2 g via INTRAVENOUS
  Filled 2021-03-12: qty 50

## 2021-03-12 MED ORDER — ALBUTEROL SULFATE (2.5 MG/3ML) 0.083% IN NEBU: 2.5000 mg | INHALATION_SOLUTION | RESPIRATORY_TRACT | Status: DC | PRN

## 2021-03-12 MED ORDER — POTASSIUM CHLORIDE 20 MEQ PO PACK
40.0000 meq | PACK | Freq: Once | ORAL | Status: AC
  Administered 2021-03-12: 40 meq via ORAL
  Filled 2021-03-12: qty 2

## 2021-03-12 NOTE — Progress Notes (Signed)
Same day note  Patient seen and examined at bedside.  Patient was admitted to the hospital for altered mental status and chest pain  At the time of my evaluation, patient complains of mild shortness of breath but denies current chest pain.  Physical examination reveals thinly built male, on nasal cannula oxygen, diminished breath sounds bilaterally.  Alert awake and oriented to place and time  Laboratory data and imaging was reviewed  Assessment and Plan.  Chest pain, CAD, elevated troponin. Elevated troponins.  EKG with sinus tachycardia.  Received aspirin.  Continue aspirin and statins.  Cardiology has been consulted.  Follow recommendations.  Lipid profile reviewed.  Acute metabolic encephalopathy CT without any acute findings.  Patient denies any current alcohol ingestion.  History of mild cognitive decline.  Continue thiamine.  No significant CO2 retention.  Ammonia was 14.  CO2 of 47.  Has history of chronic respiratory failure on supplemental oxygen at home.  No signs of infection so far.  Hold benzodiazepines due to hallucinations and confusion.  CT was negative.  No leukocytosis.  History of falls.  We will get PT evaluation.  Chronic respiratory failure, COPD No acute exacerbation.  On 2 L of oxygen.  Continue albuterol as needed.  Avoid steroids.  Paroxysmal atrial fibrillation.  Cardiology has been consulted.  Transient.  Controlled at this time.  Has been started on apixaban.  Continue aspirin.  Hypokalemia Replenished.  Replace magnesium as well.  Check levels in a.m.  Hypomagnesemia.  Will replace with IV magnesium sulfate.  GERD Continue PPI.  No Charge  Signed,  Delila Pereyra, MD [facility]

## 2021-03-12 NOTE — Consult Note (Addendum)
Cardiology Consultation:   Patient ID: QUINT CHESTNUT MRN: [id number]; DOB: [birth year]  Admit date: 03/11/2021 Date of Consult: 03/12/2021  PCP:  [name], MD   [facility] Providers Cardiologist:  [name], MD        Patient Profile:   MOLLY MASELLI is a 70 y.o. male with a hx of CAD (inferolateral MI 2002 s/p PCI to LCx 2002 with staged PCI to LAD 2003) COPD/severe emphsema, chronic hypoxic respiratory failure (on O2 after Covid PNA 06/2020), HLD, ETOH use, cognitive decline. 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, PAF, aortic atherosclerosis, moderate pericardial effusion by echo 11/2020, physical deconditioning who is being seen 03/12/2021 for the evaluation of elevated troponin/atrial flutter at the request of Dr. Louanne Belton.  Of note, chart carries prior hx of AAA by duplex 2018 but study reported normal size aorta at that time.  History of Present Illness:   Mr. Huebert is followed by Dr. [name] with above history. He had remote evolving inferolateral MI 2002 treated with angioplasty/stent to the LCx. He had residual disease with subsequent He subsequently had repeat cath 2003 with staged PCI/DES to the LAD with residual mild RCA disease, EF normal. (This was his last cath.) He was seen by our team as during 02/2020 admission for chest pain and negative troponins. Nuclear stress test was normal. There was mention of cognitive decline at that time. In 06/2020 he was admitted for Covid PNA and required O2 at discharge. He had reports of transient afib during that admission. He was subsequently started on low dose Eliquis as OP by primary care. Cardiology did not see at that time. He was seen as OP in 08/2020 with right sided weakness and imaging showed stroke. CT angio head showed no intracranial stenosis. In 11/2020 he was admitted with COPD exacerbation. 2D Echo showed EF 60-65%, grade 2 DD, mildly reduced RVF, mildly elevated PASP, moderate pericardial effusion with equivocal evidence of  tamponade but overall impression was that abnormalities were due to increased WOB rather than tamponade. At last tele-health O 11/2020 with Dr. [name], he was reported to be generally debilitated spending most of his time in a wheelchair.  Per chart, he was brought to [facility] yesterday with reports of visual and auditory hallucinations and cough. Most of history is obtained from chart as patient is poor historian and family not currently present. Per H/P, patient has been seeing things, holding full blown conversations with people who are not there, wandeirng outside the home and potentially overdosing on his Bretzi inhaler due to some cough and SOB. He has also had several falls and had not been sleeping for the last 5 days. His whole right arm is bruised. He had also had complaints of some chest pain and headache about 4 days ago. He vaguely recalls having a "muscle spasm" in his chest but beyond that is not able to give any other details. States his breathing is better and cannot tell me where he is or what the date is. Overnight in the ED he briefly went into narrow complex tachycardia for about 14 minutes but spontaneously converted before intervention (reported as atrial flutter but appears possibly SVT).  Labs notable for elevated but flat troponin [phone], hypokalemia down to 3.2, hypomagnesemia 1.6, mildly elevated transaminases, normal Hgb, UDS + benzos which are reported as prescribed, ETOH <10. CT head nonacute. CXR nonacute. 2D echo pending.  Past Medical History:  Diagnosis Date   Alcohol abuse    Aortic atherosclerosis ([place])  CAD in native artery    Chronic respiratory failure (HCC)    COPD (chronic obstructive pulmonary disease) (HCC)    DJD (degenerative joint disease)    diffusely   GERD (gastroesophageal reflux disease)    Hyperlipidemia    On home O2    Paroxysmal atrial fibrillation (HCC)    Pericardial effusion    Pneumonia due to COVID-19 virus    PUD (peptic ulcer  disease)    with bleeding   Rotator cuff disorder    has been evaluated by Dr Clifton James and [facility]   Stroke (cerebrum) [facility])     Past Surgical History:  Procedure Laterality Date   COLONOSCOPY     EYE SURGERY     right eye removed at age 49   heart stent     x3- 2006     Home Medications:  Prior to Admission medications   Medication Sig Start Date End Date Taking? Authorizing Provider  albuterol (PROVENTIL) (2.5 MG/3ML) 0.083% nebulizer solution Take 3 mLs (2.5 mg total) by nebulization every 4 (four) hours as needed for wheezing or shortness of breath. 01/17/19  Yes Parrett, Tammy S, NP  albuterol (VENTOLIN HFA) 108 (90 Base) MCG/ACT inhaler INHALE 2 PUFFS INTO THE LUNGS EVERY 4 HOURS AS NEEDED FOR WHEEZING AND SHORTNESS OF BREATH Patient taking differently: Inhale 1-2 puffs into the lungs every 4 (four) hours as needed for shortness of breath or wheezing. 12/25/20  Yes [name], MD  apixaban (ELIQUIS) 5 MG TABS tablet Take 1 tablet (5 mg total) by mouth 2 (two) times daily. 01/23/21  Yes [name], MD  aspirin 81 MG tablet Take 81 mg by mouth at bedtime.   Yes [provider]  atorvastatin (LIPITOR) 40 MG tablet Take 1 tablet (40 mg total) by mouth at bedtime. 10/14/20  Yes [place], Modena Nunnery, MD  Budeson-Glycopyrrol-Formoterol (BREZTRI AEROSPHERE) 160-9-4.8 MCG/ACT AERO Inhale 2 puffs into the lungs 2 (two) times daily. 01/29/21  Yes [name], MD  Cholecalciferol (D3 PO) Take 1 tablet by mouth daily.   Yes [provider]  Cyanocobalamin (VITAMIN B-12 PO) Take 1 tablet by mouth daily.   Yes [provider]  fluticasone (FLONASE) 50 MCG/ACT nasal spray USE 2 SPRAYS in EACH NOSTRIL EVERY DAY Patient taking differently: Place 2 sprays into both nostrils daily. 04/17/20  Yes [place], Modena Nunnery, MD  folic acid (FOLVITE) 1 MG tablet TAKE 1 TABLET BY MOUTH EVERY DAY Please make appointment WITH new pcp Patient taking differently: Take 1 mg by  mouth daily. 02/03/21  Yes [name], MD  LORazepam (ATIVAN) 0.5 MG tablet Take 1 tablet (0.5 mg total) by mouth 2 (two) times daily as needed for anxiety. 10/14/20  Yes [place], Modena Nunnery, MD  Multiple Vitamin (MULTIVITAMIN WITH MINERALS) TABS tablet Take 1 tablet by mouth daily. 03/09/20  Yes Kayleen Memos, DO  omeprazole (PRILOSEC) 40 MG capsule Take 1 capsule (40 mg total) by mouth daily. 10/14/20  Yes , Modena Nunnery, MD  zinc gluconate 50 MG tablet Take 50 mg by mouth daily.   Yes [provider]  nitroGLYCERIN (NITROSTAT) 0.4 MG SL tablet dissolve 1 TABLET UNDER THE TONGUE every 5 minutes AS NEEDED CHEST PAIN Patient taking differently: Place 0.4 mg under the tongue every 5 (five) minutes as needed for chest pain. 09/12/20   , Modena Nunnery, MD  traMADol (ULTRAM) 50 MG tablet TAKE 1 TABLET BY MOUTH EVERY 6 HOURS AS NEEDED FOR moderate PAIN  Patient not taking: No sig reported 07/07/19   [name], MD    Inpatient Medications: Scheduled Meds:  apixaban  5 mg Oral BID   aspirin  81 mg Oral QHS   atorvastatin  40 mg Oral QHS   folic acid  1 mg Oral Daily   mometasone-formoterol  2 puff Inhalation BID   pantoprazole  40 mg Oral Daily   thiamine  500 mg Oral Daily   umeclidinium bromide  1 puff Inhalation Daily   vitamin B-12  100 mcg Oral Daily   Continuous Infusions:  magnesium sulfate bolus IVPB 2 g (03/12/21 0851)   PRN Meds: acetaminophen, albuterol, ondansetron (ZOFRAN) IV  Allergies:   No Known Allergies  Social History:   Social History   Socioeconomic History   Marital status: Single    Spouse name: Not on file   Number of children: Not on file   Years of education: Not on file   Highest education level: Not on file  Occupational History   Occupation: retired  Tobacco Use   Smoking status: Former    Packs/day: 1.00    Years: 40.00    Pack years: 40.00    Types: Cigarettes   Smokeless tobacco: Never   Tobacco comments:    declines  patch  Vaping Use   Vaping Use: Never used  Substance and Sexual Activity   Alcohol use: Yes    Alcohol/week: 30.0 standard drinks    Types: 30 Cans of beer per week    Comment: h/o heavy use, now 4 beers per day   Drug use: Not Currently   Sexual activity: Yes  Other Topics Concern   Not on file  Social History Narrative   Right Handed   Lives in one story home       Patient has not smoked for 3 months   Patient on 2L of Oxygen    Social Determinants of Health   Financial Resource Strain: Medium Risk   Difficulty of Paying Living Expenses: Somewhat hard  Food Insecurity: No Food Insecurity   Worried About Charity fundraiser in the Last Year: Never true   Ran Out of Food in the Last Year: Never true  Transportation Needs: No Transportation Needs   Lack of Transportation (Medical): No   Lack of Transportation (Non-Medical): No  Physical Activity: Inactive   Days of Exercise per Week: 0 days   Minutes of Exercise per Session: 0 min  Stress: Not on file  Social Connections: Not on file  Intimate Partner Violence: Not on file    Family History:    Family History  Problem Relation Age of Onset   Ovarian cancer Mother    Diabetes Maternal Grandfather    COPD Father    Diabetes Paternal Grandfather    Colon cancer Paternal Grandfather    Esophageal cancer Neg Hx    Rectal cancer Neg Hx    Stomach cancer Neg Hx      ROS:  Unable to fully assess due to unreliable historian  Physical Exam/Data:   Vitals:   03/12/21 0530 03/12/21 0545 03/12/21 0600 03/12/21 0755  BP: (!) 143/77 (!) 145/82 (!) 152/93   Pulse: 91 85 91   Resp: 16 14 (!) 23   Temp:      TempSrc:      SpO2: 96% 96% 94% 95%  Weight:      Height:        Intake/Output Summary (Last 24 hours) at 03/12/2021 0946  Last data filed at 03/12/2021 0505 Gross per 24 hour  Intake 200 ml  Output 300 ml  Net -100 ml   Last 3 Weights 03/11/2021 03/04/2021 12/09/2020  Weight (lbs) 121 lb 125 lb 12.8 oz 123 lb   Weight (kg) 54.885 kg 57.063 kg 55.792 kg     Body mass index is 17.87 kg/m.  General: Frail cachetic appearing WM in no acute distress Head: Normocephalic, atraumatic, sclera non-icteric, no xanthomas, nares are without discharge. Neck: Negative for carotid bruits. JVP not elevated. Lungs: Diminished BS throughout without without wheezes, rales, or rhonchi. Breathing is unlabored. Heart: RRR S1 S2 without murmurs, rubs, or gallops.  Abdomen: Soft, non-tender, non-distended with normoactive bowel sounds. No rebound/guarding. Extremities: No clubbing or cyanosis. No edema. Distal pedal pulses are 2+ and equal bilaterally. Generalized ecchymosis particularly R upper arm Neuro: Alert and oriented X 3. Moves all extremities spontaneously. Psych:  Responds to questions appropriately with a normal affect.   EKG:  The EKG was personally reviewed and demonstrates:   1st: NSR 90bpm no acute STT Changes 2nd: ? SVT 167bpm, minimal ST sagging V4-V5 3rd: sinus tach 103bpm, nonspecific TW change V6 otherwise nonacute  Telemetry:  Telemetry was personally reviewed and demonstrates:  NSR otherwise SVT as reported above briefly overnight (0008)  Relevant CV Studies: 08/2001                         Cardiac Catheterization   HISTORY:  [name] presented with an inferolateral myocardial infarction on November 25th and had stents placed in the left circumflex sequentially.  He had a severe stenosis in the mid left anterior descending, as well as moderate distal disease in the right coronary artery.  He is referred for cardiac catheterization and probable angioplasty.   PROCEDURES PERFORMED:  Left heart catheterization with selective coronary angiography and left ventricular angiography with angioplasty and stenting of the left anterior descending.   TYPE AND SITE OF ENTRY:  Percutaneous right femoral artery.   CATHETERS:  Six [place] #4 curved Judkins right and left coronary catheters, 6-French  pigtail ventriculography catheter, 7-French JL-4 guide, a Forte guide wire, BX Velocity 3.0 x 18 mm stent for primary stenting.   MEDICATIONS GIVEN DURING PROCEDURE:  Versed 4 mg IV, Integrilin, heparin, and IV nitroglycerin.   CONTRAST MATERIAL:  Omnipaque.   COMMENTS:  The patient tolerated the procedure well.   HEMODYNAMIC DATA: 1. Aortic pressure was 128/80. 2. LV pressure was 110/13. 3. There was no aortic valve gradient noted on pullback.   ANGIOGRAPHIC DATA: 1. Left main coronary artery:  Normal. 2. Left anterior descending:  The left anterior descending has irregularities    proximally and gives off a moderate sized second diagonal vessel.  Just    beyond the origin of the diagonal vessel there is a segmental 75% stenosis. 3. Left circumflex:  The left circumflex has the presence of two stents that    are radiopaque and easily located.  There is mild narrowing in between the    stents at the level of the origin of a continuation Shariya Gaster.  The stents    only had mild intimal hyperplasia and are widely patent with excellent    flow. 4. Right coronary artery:  The right coronary artery initially had ostial    spasm.  IV nitroglycerin was given with resolution of most of the spasm,    but with a residual 20-30% ostial narrowing and a 30-50% narrowing before  the crux.  The distal right coronary artery was widely patent.   LEFT VENTRICULAR ANGIOGRAM:  The left ventricular angiogram was performed in the RAO position.  Overall cardiac size and silhouette are normal.  Left ventricular function is normal, with a global ejection fraction of 65%.  There is no mitral regurgitation, intracardiac calcification, or intracavitary filling defect.   ANGIOPLASTY PROCEDURE:  We used a JL-4 guide and used a Forte guide wire with marker wires.  We elected to use an 18 mm x 3.0 BX Velocity stent.  This was placed primarily and inflated initially to 14 atm and then in followup to 12 atm.  The  final angiographic result was excellent with no residual stenosis, no dissection, and preservation of all side branches.   OVERALL IMPRESSION: 1. Persistent patency of the stent to the left circumflex coronary artery. 2. Successful stent placement in the left anterior descending coronary. 3. Mild to moderate disease in the right coronary artery with ostial spasm    present. 4. Normal left ventricular function. Dictated by:   Ludwig Lean Doreatha Lew, M.D. Attending Physician:  Worthy Flank DD:  09/07/01 TD:  09/07/01 Job: [id number]  2D Echo 11/2020 IMPRESSIONS     1. Left ventricular ejection fraction, by estimation, is 60 to 65%. The  left ventricle has normal function. The left ventricle has no regional  wall motion abnormalities. Left ventricular diastolic parameters are  consistent with Grade II diastolic  dysfunction (pseudonormalization). Elevated left atrial pressure.   2. Right ventricular systolic function is mildly reduced. The right  ventricular size is normal. There is mildly elevated pulmonary artery  systolic pressure.   3. Moderate pericardial effusion. The pericardial effusion is anterior to  the right ventricle and surrounding the apex. There is equivocal evidence  of cardiac tamponade.   4. The mitral valve is normal in structure. No evidence of mitral valve  regurgitation.   5. The aortic valve is tricuspid. Aortic valve regurgitation is not  visualized. Mild aortic valve sclerosis is present, with no evidence of  aortic valve stenosis.   6. The inferior vena cava is dilated in size with >50% respiratory  variability, suggesting right atrial pressure of 8 mmHg.   7. There is enhanced respiratory displacement of the ventricular septum  and mitral inflow respiratory variation in early filling velocities. There  is no RA or RV collapse. The inferior vena cava is mildly dilated, but not  plethoric.   8. The overall impression is that the above  abnormalities are due to  increased work of breathing (e.g. COPD exacerbation), rather than  pericardial tamponade.   Comparison(s): Prior images reviewed side by side. The left ventricular  function is unchanged. The right ventricular systolic function is  unchanged. The pericardial effusion is only slightly larger compared to  last year's study. Increased respiratory  flow variation on the mitral inflow was also seen at that time. The  inferior vena cava is more dilated on the current study.   Conclusion(s)/Recommendation(s): Consider serial studies for the  pericardial effusion, but pericardial tamponade is felt to be unlikely.     Laboratory Data:  High Sensitivity Troponin:   Recent Labs  Lab 03/11/21 2050 03/12/21 0101 03/12/21 0336 03/12/21 0546  TROPONINIHS 443* 534* 645* 577*     Chemistry Recent Labs  Lab 03/11/21 2050 03/12/21 0546  NA 138 136  K 3.3* 3.2*  CL 97* 97*  CO2 31 29  GLUCOSE 127* 148*  BUN 17 15  CREATININE  0.85 0.73  CALCIUM 9.2 8.8*  GFRNONAA >60 >60  ANIONGAP 10 10    Recent Labs  Lab 03/11/21 2050 03/12/21 0546  PROT 7.5 6.7  ALBUMIN 4.3 3.8  AST 61* 55*  ALT 63* 58*  ALKPHOS 101 93  BILITOT 1.1 1.2   Hematology Recent Labs  Lab 03/11/21 2050 03/12/21 0546  WBC 8.1 7.3  RBC 6.17* 5.70  HGB 16.4 15.1  HCT 51.7 47.6  MCV 83.8 83.5  MCH 26.6 26.5  MCHC 31.7 31.7  RDW 19.6* 19.2*  PLT 318 281   BNP Recent Labs  Lab 03/11/21 2050  BNP 466.0*    DDimer No results for input(s): DDIMER in the last 168 hours.   Radiology/Studies:  CT Head Wo Contrast  Result Date: 03/11/2021 CLINICAL DATA:  Altered mental status. EXAM: CT HEAD WITHOUT CONTRAST TECHNIQUE: Contiguous axial images were obtained from the base of the skull through the vertex without intravenous contrast. COMPARISON:  October 04, 2020 FINDINGS: Brain: There is mild cerebral atrophy with widening of the extra-axial spaces and ventricular dilatation. There  are areas of decreased attenuation within the white matter tracts of the supratentorial brain, consistent with microvascular disease changes. Small, chronic bilateral basal ganglia lacunar infarcts are seen. Vascular: No hyperdense vessel or unexpected calcification. Skull: Normal. Negative for fracture or focal lesion. 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 Other: None. IMPRESSION: 1. No acute intracranial abnormality. 2. Generalized cerebral atrophy. Electronically Signed   By: Virgina Norfolk M.D.   On: 03/11/2021 21:17   DG Chest Portable 1 View  Result Date: 03/11/2021 CLINICAL DATA:  Cough and shortness of breath x 4 days. EXAM: PORTABLE CHEST 1 VIEW COMPARISON:  December 09, 2020 FINDINGS: Mild linear scarring is seen within the bilateral lung bases. There is no evidence of a pleural effusion or pneumothorax. The heart size and mediastinal contours are within normal limits. The visualized skeletal structures are unremarkable. IMPRESSION: Stable exam without active cardiopulmonary disease. Electronically Signed   By: Virgina Norfolk M.D.   On: 03/11/2021 21:19     Assessment and Plan:   1. Acute encephalopathy superimposed on previously reported cognitive decline - this is presenting complaint, associated with poor sleep, wandering, confusion, incontinence, and poor appetite - frequent falls noted  2. Elevated troponin with known history of CAD - history is difficult to elicit, so challenging to know if there has been true angina - troponins are elevated but relatively flat in the 400-600 range (previously 333 during 11/2020 admission) - given his generalized frailty, frequent falling, worsening cognitive decline he seems to be a poor candidate for aggressive medical intervention and would therefore pursue conservative, symptom-based medical management - 2D echo pending - will review with MD, including plans for anticoag/ASA (is on both ASA + Eliquis but also fall risk), see below  re: BB, continue statin if OK with primary team given LFTs  3. History of paroxysmal atrial fibrillation during prior admisison - had episode of narrow complex tachycardia overnight - looks possibly SVT, cannot exclude atrial flutter as previously suggested - was on low dose Eliquis as OP- technically qualifies for 7m BID dosing but with h/o ETOH abuse, frailty and falls, his candidacy for continued anticoagulation is in question - although carries high CHADSVASC score due to prior CVA - recommend to replete lytes as below - has not previously been on BB due to hypotension per notes, consideration could be given to low dose diltiazem versus low dose selective BB for arrhythmia control  4. Hypokalemia/hypomagnesemia - will defer lyte management to primary team - recommend to keep K 4.0 or greater and Mg 2.0 or greater  5. Pericardial effusion 11/2020 - repeat echo in progress   Risk Assessment/Risk Scores:     TIMI Risk Score for Unstable Angina or Non-ST Elevation MI:   The patient's TIMI risk score is 5, which indicates a 26% risk of all cause mortality, new or recurrent myocardial infarction or need for urgent revascularization in the next 14 days.   CHA2DS2-VASc Score = 5  This indicates a 7.2% annual risk of stroke. The patient's score is based upon: CHF History: No HTN History: Yes Diabetes History: No Stroke History: Yes Vascular Disease History: Yes Age Score: 1 Gender Score: 0     For questions or updates, please contact [place] Please consult [email or web address] for contact info under    Signed, Charlie Pitter, PA-C  03/12/2021 9:46 AM  Attending note Patient seen and discussed with PA Dunn, I agree with her documentation. 70 yo male history of CAD as reported above, COPD, EtoH, CVA PAF presents with AMS/hallucinations and chest pain. Due to altered mental status patient is a poor historian, unable to collect history. On my interview he actually denies ever having  any chest pain.      K 3.3 Cr 0.85 BUN 17 WBC 8.1 Plt 318 Hgb 16.4 BNP 466 UDS + benzos TSH 0.9  ABG 7.4/48/75/28 COVID neg Trop 433-->534-->645-->577 Echo pending EKG NSR, no ischemic changes. F/u EKG aflutter 160   11/2020 echo LVEF 60-65%, grade II dd, no WMAs, mild RV dysfunction, mod pericardial effusion 02/2020 nuclear stress: no ischemia.   Patient with history of CAD presents with chest pain, elevated troponin. Significant AMS with active visual/auditory hallucinations, reported chest pain to other providers but on my interview denies. Given comorbidities and active mental state he is not a cath candidate and thus would not pursue ischemic testing. Could perhaps be demand ischemia in setting of tachyarrhythmias though low burden here so far. Depending on clincal course of his mental status and chest symptosm could reconsider ischemic testing as outpatient over time, but no plans for inpatient testing. Will f/u echo.May try low dose beta blocker pending telemetry review this admission.   Defer anticoag/antiplatelet regimen to his regular cardiologist Dr Holland Falling his home regimen of eliquis and ASA.   Carlyle Dolly MD

## 2021-03-12 NOTE — ED Notes (Signed)
Report received from [facility]. Pt resting comfortably, sitter outside room for pt safety. Pt O2 increased to 3L d/t O2 at 88% on 2l. Pt denies hallucinations currently but does report having them for the last 4 weeks. Pt denies SI?HI

## 2021-03-12 NOTE — ED Notes (Signed)
Patient transported to MRI 

## 2021-03-12 NOTE — ED Notes (Signed)
Date and time results received: 03/12/21 5:09 AM  Test: Troponin Critical Value: 645  Name of Provider Notified: Dr. Clearence Ped  Orders Received? Or Actions Taken?:

## 2021-03-12 NOTE — H&P (Signed)
TRH H&P    Patient Demographics:    Zachary Chambers, is a 70 y.o. male  MRN: [id number]  DOB - September 23, 1950  Admit Date - 03/11/2021  Referring MD/NP/PA: Rancour  Outpatient Primary MD for the patient is [name], MD  Patient coming from: Home  Chief complaint- altered mental status and chest pain   HPI:    Zachary Chambers  is a 70 y.o. male, with history of COPD, GERD, hyperlipidemia, myocardial infarction, paroxysmal atrial fibrillation, and more presents the ED with a chief complaint of hallucinations and chest pain.  Patient is confused and wife provides most of the history.  She reports that he has been seeing things, and holding full-blown conversations with people who are not there.  She thinks it is because he has been overdosing on his Breztri inhaler.  She reports that he uses it like a rescue inhaler even though its only supposed to be every 12 hours.  She reports that he is also demonstrated his confusion by scooting down in the bed and spinning off the end, and hitting the floor.  She reports has had 2 falls in the last 2 nights.  Is also been going to the door in the middle of the night and wandering around outside.  She reports this all started 1 week ago.  He has associated urinary incontinence, and bowel incontinence.  She reports he has been complaining of chest pain and headache.  That was about 4 days ago.  Wife gave him meds for sinuses, she thought that was likely what was causing his chest pain and headache.  Wife monitors all of his meds, and used to be an Therapist, sports.  She thinks he has had increased shortness of breath, and has been giving him his nebulizer more often.  She reports he has had a decrease in appetite, which is resulted in him missing some doses of medications.  She does not want him to have the medications when he has an empty stomach.  Lastly wife reports that patient has not been sleeping for  about 5 days.  Wife provides no other history at this time.  Patient reports that he does not smoke, quit drinking in the end of 2021, does not use illicit drugs.  He has had his COVID-vaccine.  He would like to be full code at this time.  In the ED Temp 98.2, heart rate 89-94, respiratory rate 18-24, blood pressure 164/92, maintaining saturations on 3 L nasal cannula Wife reports baseline oxygen is 2.5 L nasal cannula EKG shows a heart rate of 103, QTc 418 and appears to be sinus tach. Patient did briefly go into a flutter which was caught on EKG, but resolved spontaneously.  Cardizem was ordered but never given. Patient has negative respiratory panel UA is not indicative of UTI, urine culture pending UDS is positive for benzos which are prescribed Patient has a pH of 7.4, CO2 of 47 BNP is 466, troponin 443 and then 534 No leukocytosis, hemoglobin 16.4 Slight hypokalemia at 3.3 AST 61, ALT 63 with  history of drinking Alcohol is less than 10 Chest x-ray is stable exam without acute cardiopulmonary disease CT head shows no acute changes Admission requested for acute metabolic encephalopathy and chest pain     Review of systems:    Full review of systems could not be obtained secondary to patient's confusion    Past History of the following :    Past Medical History:  Diagnosis Date   COPD (chronic obstructive pulmonary disease) (HCC)    not on home O2   DJD (degenerative joint disease)    diffusely   GERD (gastroesophageal reflux disease)    Hyperlipidemia    MI (myocardial infarction) ([place]) 2006   small MI and stents x 3  Dr. Doreatha Lew   Paroxysmal atrial fibrillation ([place])    PUD (peptic ulcer disease)    with bleeding   Rotator cuff disorder    has been evaluated by Dr Clifton James and Koleen Distance      Past Surgical History:  Procedure Laterality Date   COLONOSCOPY     EYE SURGERY     right eye removed at age 61   heart stent     x3- 2006      Social History:       Social History   Tobacco Use   Smoking status: Former    Packs/day: 1.00    Years: 40.00    Pack years: 40.00    Types: Cigarettes   Smokeless tobacco: Never   Tobacco comments:    declines patch  Substance Use Topics   Alcohol use: Yes    Alcohol/week: 30.0 standard drinks    Types: 30 Cans of beer per week    Comment: h/o heavy use, now 4 beers per day       Family History :     Family History  Problem Relation Age of Onset   Ovarian cancer Mother    Diabetes Maternal Grandfather    COPD Father    Diabetes Paternal Grandfather    Colon cancer Paternal Grandfather    Esophageal cancer Neg Hx    Rectal cancer Neg Hx    Stomach cancer Neg Hx       Home Medications:   Prior to Admission medications   Medication Sig Start Date End Date Taking? Authorizing Provider  albuterol (PROVENTIL) (2.5 MG/3ML) 0.083% nebulizer solution Take 3 mLs (2.5 mg total) by nebulization every 4 (four) hours as needed for wheezing or shortness of breath. 01/17/19   Parrett, Fonnie Mu, NP  albuterol (VENTOLIN HFA) 108 (90 Base) MCG/ACT inhaler INHALE 2 PUFFS INTO THE LUNGS EVERY 4 HOURS AS NEEDED FOR WHEEZING AND SHORTNESS OF BREATH 12/25/20   [name], MD  apixaban (ELIQUIS) 5 MG TABS tablet Take 1 tablet (5 mg total) by mouth 2 (two) times daily. 01/23/21   [name], MD  aspirin 81 MG tablet Take 81 mg by mouth at bedtime.    [provider]  atorvastatin (LIPITOR) 40 MG tablet Take 1 tablet (40 mg total) by mouth at bedtime. 10/14/20   [name], MD  Budeson-Glycopyrrol-Formoterol (BREZTRI AEROSPHERE) 160-9-4.8 MCG/ACT AERO Inhale 2 puffs into the lungs 2 (two) times daily. 01/29/21   [name], MD  Cyanocobalamin (VITAMIN B-12 PO) Take 1 tablet by mouth daily.    [provider]  fluticasone (FLONASE) 50 MCG/ACT nasal spray USE 2 SPRAYS in EACH NOSTRIL EVERY DAY Patient taking differently: Place 2 sprays into both nostrils daily. 04/17/20  Gratis, Modena Nunnery, MD  folic acid (FOLVITE) 1 MG tablet TAKE 1 TABLET BY MOUTH EVERY DAY Please make appointment WITH new pcp 02/03/21   [name], MD  LORazepam (ATIVAN) 0.5 MG tablet Take 1 tablet (0.5 mg total) by mouth 2 (two) times daily as needed for anxiety. 10/14/20   [name], MD  Multiple Vitamin (MULTIVITAMIN WITH MINERALS) TABS tablet Take 1 tablet by mouth daily. 03/09/20   Kayleen Memos, DO  Niacin (VITAMIN B-3 PO) Take 1 tablet by mouth daily.    [provider]  nitroGLYCERIN (NITROSTAT) 0.4 MG SL tablet dissolve 1 TABLET UNDER THE TONGUE every 5 minutes AS NEEDED CHEST PAIN 09/12/20   [name], MD  omeprazole (PRILOSEC) 40 MG capsule Take 1 capsule (40 mg total) by mouth daily. 10/14/20   [name], MD  traMADol (ULTRAM) 50 MG tablet TAKE 1 TABLET BY MOUTH EVERY 6 HOURS AS NEEDED FOR moderate PAIN 07/07/19   [name], MD  zinc gluconate 50 MG tablet Take 50 mg by mouth daily.    [provider]     Allergies:    No Known Allergies   Physical Exam:   Vitals  Blood pressure 132/76, pulse 83, temperature 98.2 F (36.8 C), temperature source Oral, resp. rate 17, height '5\' 9"'$  (1.753 m), weight 54.9 kg, SpO2 94 %.  1.  General: Patient lying supine in bed, head of bed elevated, no acute distress   2. Psychiatric: Alert and oriented x 3, mood is odd, patient witnessed to have conversation with children in the room that were not really there, pleasant and cooperative with exam   3. 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 HEENMT:  Head is atraumatic, erythema over face, normocephalic, pupils reactive to light, neck is supple, trachea is midline, mucous membranes are moist   5. Respiratory : Lungs are clear to auscultation  bilaterally without wheezing, rhonchi, rales, no cyanosis, no increase in work of breathing or accessory muscle use   6. Cardiovascular : Heart rate slightly tachycardic, rhythm is regular, no murmurs, rubs or gallops, no peripheral edema, peripheral pulses palpated   7. Gastrointestinal:  Abdomen is soft, nondistended, nontender to palpation bowel sounds active, no masses or organomegaly palpated   8. Skin:  Skin is warm, dry and intact without rashes, acute lesions, or ulcers on limited exam   9.Musculoskeletal:  No acute deformities or trauma, no asymmetry in tone, no peripheral edema, peripheral pulses palpated, no tenderness to palpation in the extremities     Data Review:    CBC Recent Labs  Lab 03/11/21 2050  WBC 8.1  HGB 16.4  HCT 51.7  PLT 318  MCV 83.8  MCH 26.6  MCHC 31.7  RDW 19.6*  LYMPHSABS 0.8  MONOABS 1.2*  EOSABS 0.1  BASOSABS 0.1   ------------------------------------------------------------------------------------------------------------------  Results for orders placed or performed during the hospital encounter of 03/11/21 (from the past 48 hour(s))  Comprehensive metabolic panel     Status: Abnormal   Collection Time: 03/11/21  8:50 PM  Result Value Ref Range   Sodium 138 135 - 145 mmol/L   Potassium 3.3 (L) 3.5 - 5.1 mmol/L   Chloride 97 (L) 98 - 111 mmol/L   CO2 31 22 - 32 mmol/L   Glucose, Bld 127 (H) 70 -  99 mg/dL    Comment: Glucose reference range applies only to samples taken after fasting for at least 8 hours.   BUN 17 8 - 23 mg/dL   Creatinine, Ser 0.85 0.61 - 1.24 mg/dL   Calcium 9.2 8.9 - 10.3 mg/dL   Total Protein 7.5 6.5 - 8.1 g/dL   Albumin 4.3 3.5 - 5.0 g/dL   AST 61 (H) 15 - 41 U/L   ALT 63 (H) 0 - 44 U/L   Alkaline Phosphatase 101 38 - 126 U/L   Total Bilirubin 1.1 0.3 - 1.2 mg/dL   GFR, Estimated >60 >60 mL/min    Comment: (NOTE) Calculated using the CKD-EPI Creatinine Equation (2021)    Anion gap 10 5 - 15     Comment: Performed at [facility], [address], [place], [place] [address]  CBC with Differential     Status: Abnormal   Collection Time: 03/11/21  8:50 PM  Result Value Ref Range   WBC 8.1 4.0 - 10.5 K/uL   RBC 6.17 (H) 4.22 - 5.81 MIL/uL   Hemoglobin 16.4 13.0 - 17.0 g/dL   HCT 51.7 39.0 - 52.0 %   MCV 83.8 80.0 - 100.0 fL   MCH 26.6 26.0 - 34.0 pg   MCHC 31.7 30.0 - 36.0 g/dL   RDW 19.6 (H) 11.5 - 15.5 %   Platelets 318 150 - 400 K/uL   nRBC 0.0 0.0 - 0.2 %   Neutrophils Relative % 72 %   Neutro Abs 5.8 1.7 - 7.7 K/uL   Lymphocytes Relative 9 %   Lymphs Abs 0.8 0.7 - 4.0 K/uL   Monocytes Relative 14 %   Monocytes Absolute 1.2 (H) 0.1 - 1.0 K/uL   Eosinophils Relative 1 %   Eosinophils Absolute 0.1 0.0 - 0.5 K/uL   Basophils Relative 1 %   Basophils Absolute 0.1 0.0 - 0.1 K/uL   Immature Granulocytes 3 %   Abs Immature Granulocytes 0.23 (H) 0.00 - 0.07 K/uL    Comment: Performed at [facility], [address], [place],  [place] [address]  Ethanol     Status: None   Collection Time: 03/11/21  8:50 PM  Result Value Ref Range   Alcohol, Ethyl (B) <10 <10 mg/dL    Comment: (NOTE) Lowest detectable limit for serum alcohol is 10 mg/dL.  For medical purposes only. Performed at [facility], [address], [place], [place] [address]   Ammonia     Status: None   Collection Time: 03/11/21  8:50 PM  Result Value Ref Range   Ammonia 14 9 - 35 umol/L    Comment: Performed at [facility], [address], [place], [place] [address]  Troponin I (High Sensitivity)     Status: Abnormal   Collection Time: 03/11/21  8:50 PM  Result Value Ref Range   Troponin I (High Sensitivity) 443 (HH) <18 ng/L    Comment: CRITICAL RESULT CALLED TO, READ BACK BY AND VERIFIED WITH: GESSELL,K '@0004'$  BY MATTHEWS, B 7.27.22 (NOTE) Elevated high sensitivity troponin I (hsTnI) values and significant  changes across serial measurements may suggest ACS but many other  chronic and acute conditions are  known to elevate hsTnI results.  Refer to the Links section for chest pain algorithms and additional  guidance. Performed at [facility], [address] [place], [place] [address]   Brain natriuretic peptide     Status: Abnormal   Collection Time: 03/11/21  8:50 PM  Result Value Ref Range   B  Natriuretic Peptide 466.0 (H) 0.0 - 100.0 pg/mL    Comment: Performed at [facility], [address] [place], [place] [address]  Blood gas, arterial (at [facility])     Status: Abnormal   Collection Time: 03/11/21 11:17 PM  Result Value Ref Range   FIO2 28.00    pH, Arterial 7.409 7.350 - 7.450   pCO2 arterial 47.8 32.0 - 48.0 mmHg   pO2, Arterial 75.7 (L) 83.0 - 108.0 mmHg   Bicarbonate 28.2 (H) 20.0 - 28.0 mmol/L   Acid-Base Excess 5.1 (H) 0.0 - 2.0 mmol/L   O2 Saturation 94.2 %   Patient temperature 37.0    Allens test (pass/fail) BRACHIAL ARTERY (A) PASS    Comment: Performed at [facility], [address], [place], [place] [address]  POC CBG, ED     Status: Abnormal   Collection Time: 03/11/21 11:31 PM  Result Value Ref Range   Glucose-Capillary 105 (H) 70 - 99 mg/dL    Comment: Glucose reference range applies only to samples taken after fasting for at least 8 hours.  Rapid urine drug screen (hospital performed)     Status: Abnormal   Collection Time: 03/12/21 12:01 AM  Result Value Ref Range   Opiates NONE DETECTED NONE DETECTED   Cocaine NONE DETECTED NONE DETECTED   Benzodiazepines POSITIVE (A) NONE DETECTED   Amphetamines NONE DETECTED NONE DETECTED   Tetrahydrocannabinol NONE DETECTED NONE DETECTED   Barbiturates NONE DETECTED NONE DETECTED    Comment: (NOTE) DRUG SCREEN FOR MEDICAL PURPOSES ONLY.  IF CONFIRMATION IS NEEDED FOR ANY PURPOSE, NOTIFY LAB WITHIN 5 DAYS.  LOWEST DETECTABLE LIMITS FOR URINE DRUG SCREEN Drug Class                     Cutoff (ng/mL) Amphetamine and metabolites    1000 Barbiturate and metabolites    200 Benzodiazepine                  [id number] Tricyclics and metabolites     300 Opiates and metabolites        300 Cocaine and metabolites        300 THC                            50 Performed at [facility], [address], [place], [place] [address]   Urinalysis, Routine w reflex microscopic Urine, Clean Catch     Status: Abnormal   Collection Time: 03/12/21 12:01 AM  Result Value Ref Range   Color, Urine AMBER (A) YELLOW    Comment: BIOCHEMICALS MAY BE AFFECTED BY COLOR   APPearance CLEAR CLEAR   Specific Gravity, Urine 1.023 1.005 - 1.030   pH 6.0 5.0 - 8.0   Glucose, UA NEGATIVE NEGATIVE mg/dL   Hgb urine dipstick NEGATIVE NEGATIVE   Bilirubin Urine NEGATIVE NEGATIVE   Ketones, ur NEGATIVE NEGATIVE mg/dL   Protein, ur 100 (A) NEGATIVE mg/dL   Nitrite NEGATIVE NEGATIVE   Leukocytes,Ua NEGATIVE NEGATIVE   RBC / HPF 0-5 0 - 5 RBC/hpf   WBC, UA 0-5 0 - 5 WBC/hpf   Bacteria, UA NONE SEEN NONE SEEN   Squamous Epithelial / LPF 0-5 0 - 5   Mucus PRESENT     Comment: Performed at [facility], [address] [place], [place] [address]  Resp Panel by RT-PCR (Flu A&B, Covid) Nasopharyngeal Swab     Status: None   Collection Time:  03/12/21 12:01 AM   Specimen: Nasopharyngeal Swab; Nasopharyngeal(NP) swabs in vial transport medium  Result Value Ref Range   SARS Coronavirus 2 by RT PCR NEGATIVE NEGATIVE    Comment: (NOTE) SARS-CoV-2 target nucleic acids are NOT DETECTED.  The SARS-CoV-2 RNA is generally detectable in upper respiratory specimens during the acute phase of infection. The lowest concentration of SARS-CoV-2 viral copies this assay can detect is 138 copies/mL. A negative result does not preclude SARS-Cov-2 infection and should not be used as the sole basis for treatment or other patient management decisions. A negative result may occur with  improper specimen collection/handling, submission of specimen other than nasopharyngeal swab, presence of viral mutation(s) within the areas targeted by this assay, and  inadequate number of viral copies(<138 copies/mL). A negative result must be combined with clinical observations, patient history, and epidemiological information. The expected result is Negative.  Fact Sheet for Patients:  EntrepreneurPulse.com.au  Fact Sheet for Healthcare Providers:  IncredibleEmployment.be  This test is no t yet approved or cleared by the [place] FDA and  has been authorized for detection and/or diagnosis of SARS-CoV-2 by FDA under an Emergency Use Authorization (EUA). This EUA will remain  in effect (meaning this test can be used) for the duration of the COVID-19 declaration under Section 564(b)(1) of the Act, 21 U.S.C.section 360bbb-3(b)(1), unless the authorization is terminated  or revoked sooner.       Influenza A by PCR NEGATIVE NEGATIVE   Influenza B by PCR NEGATIVE NEGATIVE    Comment: (NOTE) The Xpert Xpress SARS-CoV-2/FLU/RSV plus assay is intended as an aid in the diagnosis of influenza from Nasopharyngeal swab specimens and should not be used as a sole basis for treatment. Nasal washings and aspirates are unacceptable for Xpert Xpress SARS-CoV-2/FLU/RSV testing.  Fact Sheet for Patients: EntrepreneurPulse.com.au  Fact Sheet for Healthcare Providers: IncredibleEmployment.be  This test is not yet approved or cleared by the [place] FDA and has been authorized for detection and/or diagnosis of SARS-CoV-2 by FDA under an Emergency Use Authorization (EUA). This EUA will remain in effect (meaning this test can be used) for the duration of the COVID-19 declaration under Section 564(b)(1) of the Act, 21 U.S.C. section 360bbb-3(b)(1), unless the authorization is terminated or revoked.  Performed at [facility], [address] [place], [place] [address]   Troponin I (High Sensitivity)     Status: Abnormal   Collection Time: 03/12/21  1:01 AM  Result Value Ref Range    Troponin I (High Sensitivity) 534 (HH) <18 ng/L    Comment: DELTA CHECK NOTED CRITICAL RESULT CALLED TO, READ BACK BY AND VERIFIED WITH: spence,h'@0233'$  by matthews, b 7.27.22 (NOTE) Elevated high sensitivity troponin I (hsTnI) values and significant  changes across serial measurements may suggest ACS but many other  chronic and acute conditions are known to elevate hsTnI results.  Refer to the Links section for chest pain algorithms and additional  guidance. Performed at [facility], [address], [place], [place] [address]     Chemistries  Recent Labs  Lab 03/11/21 2050  NA 138  K 3.3*  CL 97*  CO2 31  GLUCOSE 127*  BUN 17  CREATININE 0.85  CALCIUM 9.2  AST 61*  ALT 63*  ALKPHOS 101  BILITOT 1.1   ------------------------------------------------------------------------------------------------------------------  ------------------------------------------------------------------------------------------------------------------ GFR: Estimated Creatinine Clearance: 61.9 mL/min (by C-G formula based on SCr of 0.85 mg/dL). Liver Function Tests: Recent Labs  Lab 03/11/21 2050  AST 61*  ALT 63*  ALKPHOS 101  BILITOT 1.1  PROT 7.5  ALBUMIN 4.3   No results for input(s): LIPASE, AMYLASE in the last 168 hours. Recent Labs  Lab 03/11/21 2050  AMMONIA 14   Coagulation Profile: No results for input(s): INR, PROTIME in the last 168 hours. Cardiac Enzymes: No results for input(s): CKTOTAL, CKMB, CKMBINDEX, TROPONINI in the last 168 hours. BNP (last 3 results) No results for input(s): PROBNP in the last 8760 hours. HbA1C: No results for input(s): HGBA1C in the last 72 hours. CBG: Recent Labs  Lab 03/11/21 2331  GLUCAP 105*   Lipid Profile: No results for input(s): CHOL, HDL, LDLCALC, TRIG, CHOLHDL, LDLDIRECT in the last 72 hours. Thyroid Function Tests: No results for input(s): TSH, T4TOTAL, FREET4, T3FREE, THYROIDAB in the last 72 hours. Anemia Panel: No  results for input(s): VITAMINB12, FOLATE, FERRITIN, TIBC, IRON, RETICCTPCT in the last 72 hours.  --------------------------------------------------------------------------------------------------------------- Urine analysis:    Component Value Date/Time   COLORURINE AMBER (A) 03/12/2021 0001   APPEARANCEUR CLEAR 03/12/2021 0001   LABSPEC 1.023 03/12/2021 0001   PHURINE 6.0 03/12/2021 0001   GLUCOSEU NEGATIVE 03/12/2021 0001   HGBUR NEGATIVE 03/12/2021 0001   BILIRUBINUR NEGATIVE 03/12/2021 0001   KETONESUR NEGATIVE 03/12/2021 0001   PROTEINUR 100 (A) 03/12/2021 0001   NITRITE NEGATIVE 03/12/2021 0001   LEUKOCYTESUR NEGATIVE 03/12/2021 0001      Imaging Results:    CT Head Wo Contrast  Result Date: 03/11/2021 CLINICAL DATA:  Altered mental status. EXAM: CT HEAD WITHOUT CONTRAST TECHNIQUE: Contiguous axial images were obtained from the base of the skull through the vertex without intravenous contrast. COMPARISON:  October 04, 2020 FINDINGS: Brain: There is mild cerebral atrophy with widening of the extra-axial spaces and ventricular dilatation. There are areas of decreased attenuation within the white matter tracts of the supratentorial brain, consistent with microvascular disease changes. Small, chronic bilateral basal ganglia lacunar infarcts are seen. Vascular: No hyperdense vessel or unexpected calcification. Skull: Normal. Negative for fracture or focal lesion. 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 Other: None. IMPRESSION: 1. No acute intracranial abnormality. 2. Generalized cerebral atrophy. Electronically Signed   By: Virgina Norfolk M.D.   On: 03/11/2021 21:17   DG Chest Portable 1 View  Result Date: 03/11/2021 CLINICAL DATA:  Cough and shortness of breath x 4 days. EXAM: PORTABLE CHEST 1 VIEW COMPARISON:  December 09, 2020 FINDINGS: Mild linear scarring is seen within the bilateral lung bases. There is no evidence of a pleural effusion or pneumothorax. The heart size  and mediastinal contours are within normal limits. The visualized skeletal structures are unremarkable. IMPRESSION: Stable exam without active cardiopulmonary disease. Electronically Signed   By: Virgina Norfolk M.D.   On: 03/11/2021 21:19       Assessment & Plan:    Principal Problem:   Chest pain Active Problems:   COPD (chronic obstructive pulmonary disease) (HCC)   CAD (coronary artery disease)   GERD (gastroesophageal reflux disease)   Chronic respiratory failure (HCC)   Transient atrial fibrillation (HCC)   Hypokalemia   Acute metabolic encephalopathy   Chest pain, CAD Troponins increasing from 443-534 BNP slightly elevated at 466 however chest x-ray showed stable exam EKG showed sinus tach without specific ST changes Patient given aspirin in the ED Continue patient's daily aspirin and statin Monitor on telemetry Cards consult for the a.m. Acute metabolic encephalopathy Meets the triad of wet, wacky, wobbly -CT head shows no acute findings UDS is positive for benzos which are prescribed Holding benzos in the setting  of acute metabolic encephalopathy with hallucinations and confusion Alcohol level less than 10 With patient's history of drinking, start thiamine Continue folic acid and [id number] No CO2 retention with a CO2 of 47 No signs of infection thus far Continue to monitor Chronic respiratory failure, COPD Patient maintaining sats on 3 L nasal cannula Continue inhalers from home Chest x-ray shows stable exam Patient was given albuterol and Solu-Medrol in the ED, will not continue as patient is not in COPD exacerbation Continue to monitor Elevated troponins Likely demand ischemia with patient having gone into atrial flutter Continue to trend Cardiology consult Hypokalemia Replace and recheck with morning labs GERD Continue PPI Transient atrial fibrillation Continue Eliquis  DVT Prophylaxis-   Eliquis- SCDs   AM Labs Ordered, also please review Full  Orders  Family Communication: Admission, patients condition and plan of care including tests being ordered have been discussed with the patient and wife who indicate understanding and agree with the plan and Code Status.  Code Status:  Full  Admission status: Observation  Time spent in minutes : [place]

## 2021-03-12 NOTE — ED Notes (Signed)
Critical Result: High Sensitivity Troponin: 534 Reported to Dr. Darrell Jewel

## 2021-03-12 NOTE — ED Notes (Signed)
Date and time results received: 03/12/21 0005  Test: Troponin Critical Value: 443  Name of Provider Notified: Rancour, MD

## 2021-03-13 ENCOUNTER — Telehealth: Payer: Self-pay | Admitting: Pharmacist

## 2021-03-13 DIAGNOSIS — I2510 Atherosclerotic heart disease of native coronary artery without angina pectoris: Secondary | ICD-10-CM | POA: Diagnosis present

## 2021-03-13 DIAGNOSIS — Z79899 Other long term (current) drug therapy: Secondary | ICD-10-CM | POA: Diagnosis not present

## 2021-03-13 DIAGNOSIS — J441 Chronic obstructive pulmonary disease with (acute) exacerbation: Secondary | ICD-10-CM | POA: Diagnosis present

## 2021-03-13 DIAGNOSIS — G9349 Other encephalopathy: Secondary | ICD-10-CM | POA: Diagnosis present

## 2021-03-13 DIAGNOSIS — I480 Paroxysmal atrial fibrillation: Secondary | ICD-10-CM | POA: Diagnosis present

## 2021-03-13 DIAGNOSIS — I6389 Other cerebral infarction: Secondary | ICD-10-CM | POA: Diagnosis present

## 2021-03-13 DIAGNOSIS — R778 Other specified abnormalities of plasma proteins: Secondary | ICD-10-CM | POA: Diagnosis present

## 2021-03-13 DIAGNOSIS — Z7982 Long term (current) use of aspirin: Secondary | ICD-10-CM | POA: Diagnosis not present

## 2021-03-13 DIAGNOSIS — Z7901 Long term (current) use of anticoagulants: Secondary | ICD-10-CM | POA: Diagnosis not present

## 2021-03-13 DIAGNOSIS — I25110 Atherosclerotic heart disease of native coronary artery with unstable angina pectoris: Secondary | ICD-10-CM | POA: Diagnosis not present

## 2021-03-13 DIAGNOSIS — Z8616 Personal history of COVID-19: Secondary | ICD-10-CM | POA: Diagnosis not present

## 2021-03-13 DIAGNOSIS — R443 Hallucinations, unspecified: Secondary | ICD-10-CM | POA: Diagnosis present

## 2021-03-13 DIAGNOSIS — Z8041 Family history of malignant neoplasm of ovary: Secondary | ICD-10-CM | POA: Diagnosis not present

## 2021-03-13 DIAGNOSIS — R079 Chest pain, unspecified: Secondary | ICD-10-CM | POA: Diagnosis not present

## 2021-03-13 DIAGNOSIS — I471 Supraventricular tachycardia: Secondary | ICD-10-CM | POA: Diagnosis present

## 2021-03-13 DIAGNOSIS — I4892 Unspecified atrial flutter: Secondary | ICD-10-CM | POA: Diagnosis present

## 2021-03-13 DIAGNOSIS — Z9181 History of falling: Secondary | ICD-10-CM | POA: Diagnosis not present

## 2021-03-13 DIAGNOSIS — Z87891 Personal history of nicotine dependence: Secondary | ICD-10-CM | POA: Diagnosis not present

## 2021-03-13 DIAGNOSIS — G9341 Metabolic encephalopathy: Secondary | ICD-10-CM | POA: Diagnosis present

## 2021-03-13 DIAGNOSIS — K219 Gastro-esophageal reflux disease without esophagitis: Secondary | ICD-10-CM | POA: Diagnosis present

## 2021-03-13 DIAGNOSIS — Z20822 Contact with and (suspected) exposure to covid-19: Secondary | ICD-10-CM | POA: Diagnosis present

## 2021-03-13 DIAGNOSIS — R441 Visual hallucinations: Secondary | ICD-10-CM | POA: Diagnosis present

## 2021-03-13 DIAGNOSIS — E876 Hypokalemia: Secondary | ICD-10-CM | POA: Diagnosis present

## 2021-03-13 DIAGNOSIS — Y92009 Unspecified place in unspecified non-institutional (private) residence as the place of occurrence of the external cause: Secondary | ICD-10-CM | POA: Diagnosis not present

## 2021-03-13 DIAGNOSIS — Z9981 Dependence on supplemental oxygen: Secondary | ICD-10-CM | POA: Diagnosis not present

## 2021-03-13 DIAGNOSIS — W19XXXA Unspecified fall, initial encounter: Secondary | ICD-10-CM | POA: Diagnosis present

## 2021-03-13 DIAGNOSIS — J9611 Chronic respiratory failure with hypoxia: Secondary | ICD-10-CM | POA: Diagnosis present

## 2021-03-13 LAB — BASIC METABOLIC PANEL
Anion gap: 10 (ref 5–15)
BUN: 15 mg/dL (ref 8–23)
CO2: 29 mmol/L (ref 22–32)
Calcium: 8.9 mg/dL (ref 8.9–10.3)
Chloride: 97 mmol/L — ABNORMAL LOW (ref 98–111)
Creatinine, Ser: 0.78 mg/dL (ref 0.61–1.24)
GFR, Estimated: >60 mL/min (ref >60)
Glucose, Bld: 89 mg/dL (ref 70–99)
Potassium: 3.2 mmol/L — ABNORMAL LOW (ref 3.5–5.1)
Sodium: 136 mmol/L (ref 135–145)

## 2021-03-13 LAB — URINE CULTURE: Culture: <10000 — AB

## 2021-03-13 LAB — PHOSPHORUS: Phosphorus: 2.4 mg/dL — ABNORMAL LOW (ref 2.5–4.6)

## 2021-03-13 LAB — CBC
HCT: 48.2 % (ref 39.0–52.0)
Hemoglobin: 15 g/dL (ref 13.0–17.0)
MCH: 25.8 pg — ABNORMAL LOW (ref 26.0–34.0)
MCHC: 31.1 g/dL (ref 30.0–36.0)
MCV: 83 fL (ref 80.0–100.0)
Platelets: 347 10*3/uL (ref 150–400)
RBC: 5.81 MIL/uL (ref 4.22–5.81)
RDW: 19.1 % — ABNORMAL HIGH (ref 11.5–15.5)
WBC: 8.8 10*3/uL (ref 4.0–10.5)
nRBC: 0 % (ref 0.0–0.2)

## 2021-03-13 LAB — MAGNESIUM: Magnesium: 2.1 mg/dL (ref 1.7–2.4)

## 2021-03-13 MED ORDER — METOPROLOL TARTRATE 25 MG PO TABS
25.0000 mg | ORAL_TABLET | Freq: Two times a day (BID) | ORAL | Status: DC
  Administered 2021-03-13 – 2021-03-16 (×7): 25 mg via ORAL
  Filled 2021-03-13 (×7): qty 1

## 2021-03-13 MED ORDER — POTASSIUM & SODIUM PHOSPHATES 280-160-250 MG PO PACK
1.0000 | PACK | Freq: Three times a day (TID) | ORAL | Status: DC
  Administered 2021-03-13 – 2021-03-16 (×12): 1 via ORAL
  Filled 2021-03-13 (×12): qty 1

## 2021-03-13 MED ORDER — LORAZEPAM 1 MG PO TABS
1.0000 mg | ORAL_TABLET | ORAL | Status: DC | PRN
  Administered 2021-03-13: 1 mg via ORAL
  Filled 2021-03-13 (×2): qty 1

## 2021-03-13 MED ORDER — MAGNESIUM OXIDE -MG SUPPLEMENT 400 (240 MG) MG PO TABS
400.0000 mg | ORAL_TABLET | Freq: Two times a day (BID) | ORAL | Status: DC
  Administered 2021-03-13 – 2021-03-16 (×7): 400 mg via ORAL
  Filled 2021-03-13 (×7): qty 1

## 2021-03-13 MED ORDER — HYDRALAZINE HCL 10 MG PO TABS
10.0000 mg | ORAL_TABLET | Freq: Four times a day (QID) | ORAL | Status: DC | PRN
  Administered 2021-03-14: 10 mg via ORAL
  Filled 2021-03-13: qty 1

## 2021-03-13 MED ORDER — LORAZEPAM 2 MG/ML IJ SOLN
1.0000 mg | INTRAMUSCULAR | Status: DC | PRN
  Administered 2021-03-13 (×2): 2 mg via INTRAVENOUS
  Filled 2021-03-13 (×2): qty 1

## 2021-03-13 MED ORDER — ADULT MULTIVITAMIN W/MINERALS CH
1.0000 | ORAL_TABLET | Freq: Every day | ORAL | Status: DC
  Administered 2021-03-13 – 2021-03-16 (×4): 1 via ORAL
  Filled 2021-03-13 (×4): qty 1

## 2021-03-13 NOTE — Progress Notes (Signed)
Patient has increased confusion, not cooperative with staff removing tele monitor, condom cath and oxygen. Patient climbing out of bed, ambulating without assistance. MD Pokhrel aware.

## 2021-03-13 NOTE — Progress Notes (Addendum)
Chronic Care Management Pharmacy Assistant   Name: Zachary Chambers  MRN: [id number] DOB: 07-08-1951  Reason for Encounter: Adherence Review  Medications: Facility-Administered Encounter Medications as of 03/13/2021  Medication   acetaminophen (TYLENOL) tablet 650 mg   albuterol (PROVENTIL) (2.5 MG/3ML) 0.083% nebulizer solution 2.5 mg   apixaban (ELIQUIS) tablet 5 mg   aspirin chewable tablet 81 mg   atorvastatin (LIPITOR) tablet 40 mg   folic acid (FOLVITE) tablet 1 mg   LORazepam (ATIVAN) tablet 1-4 mg   Or   LORazepam (ATIVAN) injection 1-4 mg   magnesium oxide (MAG-OX) tablet 400 mg   metoprolol tartrate (LOPRESSOR) tablet 25 mg   mometasone-formoterol (DULERA) 100-5 MCG/ACT inhaler 2 puff   multivitamin with minerals tablet 1 tablet   ondansetron (ZOFRAN) injection 4 mg   pantoprazole (PROTONIX) EC tablet 40 mg   potassium & sodium phosphates (PHOS-NAK) 280-160-250 MG packet 1 packet   thiamine tablet 500 mg   umeclidinium bromide (INCRUSE ELLIPTA) 62.5 MCG/INH 1 puff   vitamin B-12 (CYANOCOBALAMIN) tablet 100 mcg   Outpatient Encounter Medications as of 03/13/2021  Medication Sig Note   albuterol (PROVENTIL) (2.5 MG/3ML) 0.083% nebulizer solution Take 3 mLs (2.5 mg total) by nebulization every 4 (four) hours as needed for wheezing or shortness of breath.    albuterol (VENTOLIN HFA) 108 (90 Base) MCG/ACT inhaler INHALE 2 PUFFS INTO THE LUNGS EVERY 4 HOURS AS NEEDED FOR WHEEZING AND SHORTNESS OF BREATH (Patient taking differently: Inhale 1-2 puffs into the lungs every 4 (four) hours as needed for shortness of breath or wheezing.)    apixaban (ELIQUIS) 5 MG TABS tablet Take 1 tablet (5 mg total) by mouth 2 (two) times daily. 03/12/2021: Pt hasn't taken in 2 days, he is out of medication per spouse and unsure if they will be able to pick up due to cost   aspirin 81 MG tablet Take 81 mg by mouth at bedtime.    atorvastatin (LIPITOR) 40 MG tablet Take 1 tablet (40 mg total) by mouth  at bedtime.    Budeson-Glycopyrrol-Formoterol (BREZTRI AEROSPHERE) 160-9-4.8 MCG/ACT AERO Inhale 2 puffs into the lungs 2 (two) times daily.    Cholecalciferol (D3 PO) Take 1 tablet by mouth daily.    Cyanocobalamin (VITAMIN B-12 PO) Take 1 tablet by mouth daily. 03/12/2021: Pt ran out   fluticasone (FLONASE) 50 MCG/ACT nasal spray USE 2 SPRAYS in EACH NOSTRIL EVERY DAY (Patient taking differently: Place 2 sprays into both nostrils daily.)    folic acid (FOLVITE) 1 MG tablet TAKE 1 TABLET BY MOUTH EVERY DAY Please make appointment WITH new pcp (Patient taking differently: Take 1 mg by mouth daily.)    LORazepam (ATIVAN) 0.5 MG tablet Take 1 tablet (0.5 mg total) by mouth 2 (two) times daily as needed for anxiety.    Multiple Vitamin (MULTIVITAMIN WITH MINERALS) TABS tablet Take 1 tablet by mouth daily. 03/12/2021: Centrum for men   nitroGLYCERIN (NITROSTAT) 0.4 MG SL tablet dissolve 1 TABLET UNDER THE TONGUE every 5 minutes AS NEEDED CHEST PAIN (Patient taking differently: Place 0.4 mg under the tongue every 5 (five) minutes as needed for chest pain.)    omeprazole (PRILOSEC) 40 MG capsule Take 1 capsule (40 mg total) by mouth daily.    traMADol (ULTRAM) 50 MG tablet TAKE 1 TABLET BY MOUTH EVERY 6 HOURS AS NEEDED FOR moderate PAIN (Patient not taking: No sig reported)    zinc gluconate 50 MG tablet Take 50 mg by mouth daily.  Reviewed the chart for any medical/health and/or medication changes:  Office Visits: 03/04/21  Dr. Dennard Schaumann For follow-up. No medication changes. 02/24/21 Dr. Dennard Schaumann For COPD. STARTED Prednisone 40 mg daily.   Hospital Visits: 03/11/21 [facility] (2 days-Current) For Chest pain/Hallucinations. Medication changes are not complete yet.  Follow-Up:/Pharmacist Review   Charlann Lange, [place] Clinical Pharmacist Assistant [phone]

## 2021-03-13 NOTE — Progress Notes (Signed)
Patient Blood pressure 171/91 with movement, rechecked blood pressure 161/83. MD Pokhrel aware.

## 2021-03-13 NOTE — Evaluation (Signed)
Physical Therapy Evaluation Patient Details Name: Zachary Chambers MRN: [id number] DOB: 1951/07/13 Today's Date: 03/13/2021   History of Present Illness  Zachary Chambers  is a 70 y.o. male, with history of COPD, GERD, hyperlipidemia, myocardial infarction, paroxysmal atrial fibrillation, and more presents the ED with a chief complaint of hallucinations and chest pain.  Patient is confused and wife provides most of the history.  She reports that he has been seeing things, and holding full-blown conversations with people who are not there.  She thinks it is because he has been overdosing on his Breztri inhaler.  She reports that he uses it like a rescue inhaler even though its only supposed to be every 12 hours.  She reports that he is also demonstrated his confusion by scooting down in the bed and spinning off the end, and hitting the floor.  She reports has had 2 falls in the last 2 nights.  Is also been going to the door in the middle of the night and wandering around outside.  She reports this all started 1 week ago.  He has associated urinary incontinence, and bowel incontinence.  She reports he has been complaining of chest pain and headache.  That was about 4 days ago.  Wife gave him meds for sinuses, she thought that was likely what was causing his chest pain and headache.  Wife monitors all of his meds, and used to be an Therapist, sports.  She thinks he has had increased shortness of breath, and has been giving him his nebulizer more often.  She reports he has had a decrease in appetite, which is resulted in him missing some doses of medications.  She does not want him to have the medications when he has an empty stomach.  Lastly wife reports that patient has not been sleeping for about 5 days.  Wife provides no other history at this time.   Clinical Impression  Patient demonstrates slightly labored movement for sitting up at bedside, unsteady on feet and required use of RW for safety, ambulated in room/hallway without loss  of balance, but required standing rest break due to c/o SOB before returning to room.  Patient tolerated sitting up in chair after therapy - nursing staff notified.  Patient will benefit from continued physical therapy in hospital and recommended venue below to increase strength, balance, endurance for safe ADLs and gait.       Follow Up Recommendations Home health PT;Supervision for mobility/OOB;Supervision - Intermittent    Equipment Recommendations  None recommended by PT    Recommendations for Other Services       Precautions / Restrictions Precautions Precautions: Fall Restrictions Weight Bearing Restrictions: No      Mobility  Bed Mobility Overal bed mobility: Needs Assistance Bed Mobility: Supine to Sit     Supine to sit: Supervision     General bed mobility comments: increased time, slightly labored movement    Transfers Overall transfer level: Needs assistance Equipment used: None;Rolling walker (2 wheeled) Transfers: Sit to/from [facility] Sit to Stand: Min guard Stand pivot transfers: Min guard       General transfer comment: unsteady on feet without AD, required use of RW for safety  Ambulation/Gait Ambulation/Gait assistance: Min guard Gait Distance (Feet): 45 Feet Assistive device: Rolling walker (2 wheeled) Gait Pattern/deviations: Decreased step length - right;Decreased step length - left;Decreased stride length Gait velocity: decreased   General Gait Details: slow labored cadence without loss of balance, required standing rest break due to SOB  before making it back to room, on 2 LPM with SpO2 at 91-93%  Stairs            Wheelchair Mobility    Modified Rankin (Stroke Patients Only)       Balance Overall balance assessment: Needs assistance Sitting-balance support: Feet supported;No upper extremity supported Sitting balance-Leahy Scale: Good Sitting balance - Comments: seated at EOB   Standing balance support:  During functional activity;No upper extremity supported Standing balance-Leahy Scale: Poor Standing balance comment: fair using RW                             Pertinent Vitals/Pain Pain Assessment: No/denies pain    Home Living Family/patient expects to be discharged to:: Private residence Living Arrangements: Spouse/significant other Available Help at Discharge: Family;Available PRN/intermittently Type of Home: House Home Access: Stairs to enter Entrance Stairs-Rails: Right Entrance Stairs-Number of Steps: 5 Home Layout: Two level;Able to live on main level with bedroom/bathroom Home Equipment: Gilford Rile - 2 wheels;Cane - single point;Grab bars - tub/shower;Wheelchair - Education officer, community - power;Bedside commode;Shower seat Additional Comments: usually takes sponge baths    Prior Function Level of Independence: Needs assistance   Gait / Transfers Assistance Needed: household ambulator without AD  ADL's / Homemaking Assistance Needed: assisted by family        Hand Dominance   Dominant Hand: Right    Extremity/Trunk Assessment   Upper Extremity Assessment Upper Extremity Assessment: Generalized weakness    Lower Extremity Assessment Lower Extremity Assessment: Generalized weakness    Cervical / Trunk Assessment Cervical / Trunk Assessment: Normal  Communication   Communication: No difficulties  Cognition Arousal/Alertness: Awake/alert Behavior During Therapy: WFL for tasks assessed/performed Overall Cognitive Status: Within Functional Limits for tasks assessed                                        General Comments      Exercises     Assessment/Plan    PT Assessment Patient needs continued PT services  PT Problem List Decreased strength;Decreased activity tolerance;Decreased balance;Decreased mobility       PT Treatment Interventions DME instruction;Gait training;Stair training;Functional mobility training;Therapeutic  activities;Therapeutic exercise;Patient/family education;Balance training    PT Goals (Current goals can be found in the Care Plan section)  Acute Rehab PT Goals Patient Stated Goal: return home with family to assist PT Goal Formulation: With patient Time For Goal Achievement: 03/17/21 Potential to Achieve Goals: Good    Frequency Min 3X/week   Barriers to discharge        Co-evaluation               AM-PAC PT "6 Clicks" Mobility  Outcome Measure Help needed turning from your back to your side while in a flat bed without using bedrails?: None Help needed moving from lying on your back to sitting on the side of a flat bed without using bedrails?: A Little Help needed moving to and from a bed to a chair (including a wheelchair)?: A Little Help needed standing up from a chair using your arms (e.g., wheelchair or bedside chair)?: A Little Help needed to walk in hospital room?: A Little Help needed climbing 3-5 steps with a railing? : A Lot 6 Click Score: 18    End of Session Equipment Utilized During Treatment: Oxygen Activity Tolerance: Patient tolerated treatment well;Patient limited by fatigue  Patient left: in chair;with call bell/phone within reach;with chair alarm set Nurse Communication: Mobility status PT Visit Diagnosis: Unsteadiness on feet (R26.81);Other abnormalities of gait and mobility (R26.89);Muscle weakness (generalized) (M62.81)    Time: [id number] PT Time Calculation (min) (ACUTE ONLY): 22 min   Charges:   PT Evaluation $PT Eval Moderate Complexity: 1 Mod PT Treatments $Therapeutic Activity: 8-22 mins        12:05 PM, 03/13/21 Lonell Grandchild, MPT Physical Therapist with [facility] [phone] office [phone] mobile phone

## 2021-03-13 NOTE — TOC Initial Note (Addendum)
Transition of Care [facility]) - Initial/Assessment Note    Patient Details  Name: Zachary Chambers MRN: [id number] Date of Birth: Oct 06, 1950  [facility]) CM/SW Contact:    Iona Beard, [place] Phone Number: 03/13/2021, 1:38 PM  Clinical Narrative:                 CSW spoke to [name] (S/O) listed in chart due to pts confusion. Pt and [name] live together. Lelon Frohlich states that she assists pt with ADLs when needed. Pt does not drive but Lelon Frohlich provides transportation. Pt is agreeable to [facility] services as it has been recommended by PT. Pt has a walker and cane to use when needed. Per Lelon Frohlich pt has not drank or smoked since he had COVID last November. Pt is chronic on 2L O2 supplied through [place]. CSW gave referral to Marjory Lies with [facility] who accepts referral. CSW asked MD to place [facility] PT orders. TOC to follow.   Expected Discharge Plan: [place] Barriers to Discharge: Continued Medical Work up   Patient Goals and CMS Choice Patient states their goals for this hospitalization and ongoing recovery are:: Home with [facility] CMS Medicare.gov Compare Post Acute Care list provided to:: Patient Represenative (must comment) Choice offered to / list presented to : Spouse  Expected Discharge Plan and Services Expected Discharge Plan: [place] In-house Referral: Clinical Social Work Discharge Planning Services: CM Consult Post Acute Care Choice: [place] arrangements for the past 2 months: Single Family Home                                      Prior Living Arrangements/Services Living arrangements for the past 2 months: Single Family Home Lives with:: Spouse Patient language and need for interpreter reviewed:: Yes Do you feel safe going back to the place where you live?: Yes      Need for Family Participation in Patient Care: Yes (Comment) Care giver support system in place?: Yes (comment)   Criminal Activity/Legal Involvement Pertinent to Current  Situation/Hospitalization: No - Comment as needed  Activities of Daily Living   ADL Screening (condition at time of admission) Patient's cognitive ability adequate to safely complete daily activities?: Yes Is the patient deaf or have difficulty hearing?: No Does the patient have difficulty seeing, even when wearing glasses/contacts?: No Does the patient have difficulty concentrating, remembering, or making decisions?: Yes Patient able to express need for assistance with ADLs?: Yes Does the patient have difficulty dressing or bathing?: No Independently performs ADLs?: No Communication: Independent Dressing (OT): Needs assistance Is this a change from baseline?: Pre-admission baseline Grooming: Needs assistance Is this a change from baseline?: Pre-admission baseline Feeding: Independent Bathing: Needs assistance Is this a change from baseline?: Pre-admission baseline Toileting: Needs assistance Is this a change from baseline?: Pre-admission baseline In/Out Bed: Needs assistance Is this a change from baseline?: Pre-admission baseline Walks in Home: Needs assistance Is this a change from baseline?: Pre-admission baseline Does the patient have difficulty walking or climbing stairs?: Yes Weakness of Legs: Both Weakness of Arms/Hands: None  Permission Sought/Granted                  Emotional Assessment Appearance:: Appears stated age Attitude/Demeanor/Rapport: Unable to Assess Affect (typically observed): Unable to Assess Orientation: : Fluctuating Orientation (Suspected and/or reported Sundowners) Alcohol / Substance Use: Not Applicable Psych Involvement: No (comment)  Admission diagnosis:  Hallucinations [R44.3] Elevated troponin [R77.8] COPD exacerbation (HCC) [J44.1] Chest pain 123456 Metabolic encephalopathy [id number] Patient Active Problem List   Diagnosis Date Noted   Metabolic encephalopathy 0000000   Hypokalemia 123456   Acute metabolic encephalopathy  123456   Hypoxemia 12/10/2020   COPD exacerbation ([place]) 12/09/2020   Acute on chronic respiratory failure with hypoxia ([place]) 12/09/2020   Demand ischemia ([place]) 12/09/2020   Anxiety 10/07/2020   History of CVA with residual deficit 10/07/2020   Pneumonia due to COVID-19 virus 07/05/2020   Chronic respiratory failure ([place]) 07/05/2020   Transient atrial fibrillation ([place]) 07/05/2020   Dyspnea 07/04/2020   Protein-calorie malnutrition, severe 03/07/2020   S/P coronary artery stent placement    Chest pain 03/06/2020   Dyslipidemia 03/06/2020   Healthcare maintenance 03/05/2020   Heavy alcohol use 03/04/2018   Memory changes 03/04/2018   Leukopenia 05/04/2017   Bruit 01/22/2014   Muscle spasm of back 09/20/2013   COPD (chronic obstructive pulmonary disease) ([place]) 01/14/2011   CAD (coronary artery disease) 01/14/2011   GERD (gastroesophageal reflux disease) 01/14/2011   TOBACCO ABUSE 07/25/2009   Osteoarthritis 07/25/2009   ROTATOR CUFF SYNDROME, RIGHT 07/25/2009   PCP:  [name], MD Pharmacy:   [place], [place] - [address] [place] [address] Phone: [phone] Fax: [phone]  [facility] - [place], [place] [address] Phone: [phone] Fax: [phone]     Social Determinants of Health (SDOH) Interventions    Readmission Risk Interventions Readmission Risk Prevention Plan 03/13/2021  Transportation Screening Complete  Home Care Screening Complete  Medication Review (RN CM) Complete  Some recent data might be hidden

## 2021-03-13 NOTE — Progress Notes (Signed)
Patient had been very confused and agitated throughout the shift. Removed IV to left forearm as well as continuing to remove heart monitor despite redirecting from staff. Multiple attempts to get OOB without assistance despite bed alarm and frequent rounds by staff. This RN restarted IV and wrapped with kerlix as well as applied mitts. PRN Ativan at 1341 per CIWA protocol given and telemetry reapplied on posterior chest. Patient moved to room closer to nurse's desk, Ativan effective. Patient currently sleeping, bed alarm remains in place. Will continue to monitor.

## 2021-03-13 NOTE — Plan of Care (Signed)
  Problem: Acute Rehab PT Goals(only PT should resolve) Goal: Pt Will Go Supine/Side To Sit Outcome: Progressing Flowsheets (Taken 03/13/2021 1206) Pt will go Supine/Side to Sit: with modified independence Goal: Patient Will Transfer Sit To/From Stand Outcome: Progressing Flowsheets (Taken 03/13/2021 1206) Patient will transfer sit to/from stand: with supervision Goal: Pt Will Transfer Bed To Chair/Chair To Bed Outcome: Progressing Flowsheets (Taken 03/13/2021 1206) Pt will Transfer Bed to Chair/Chair to Bed: with supervision Goal: Pt Will Ambulate Outcome: Progressing Flowsheets (Taken 03/13/2021 1206) Pt will Ambulate:  50 feet  with supervision  with rolling walker   12:06 PM, 03/13/21 Lonell Grandchild, MPT Physical Therapist with [facility] [phone] office [phone] mobile phone

## 2021-03-13 NOTE — Progress Notes (Signed)
Please see full consult note from yesterday. Presented with AMS/halluciations. Very poor historian, at times has both reported and denied chest pains. Found to have elvated trop to 645 trending down. Given his significant cognitive decline and active hallucinations not a cath candidate, no plans for ischemic testing. His echo shows hyperdynamical LV without WMAs, EKG nonacute. Would continue medical management alone. Short runs of SVT on tele, start lopressor '25mg'$  bid. WE will follow up telemetry tomorrow if still admitted.    Carlyle Dolly MD

## 2021-03-13 NOTE — Progress Notes (Addendum)
PROGRESS NOTE  [name] [id number] DOB: 1950-11-07 DOA: 03/11/2021 PCP: [name], MD   LOS: 0 days   Brief narrative:  Zachary Chambers  is a 70 y.o. male, with history of COPD, GERD, hyperlipidemia, myocardial infarction, paroxysmal atrial fibrillation, presented to the ED with  the ED w complaints of chest pain and hallucinations.  As per the patient's wife he was confused and was seeing things and having conversations with people who were not there.  Patient also had fall at home and was wandering around.  Patient's wife stated that it could be because of him using too much of his inhalers.  Patient reports that he does not smoke, quit drinking in the end of 2021, does not use illicit drugs.  In the ED, blood pressure was mildly elevated with elevated respiratory rate on 3 L of nasal cannula usually uses 2.5 L of nasal cannula.  EKG showed sinus tachycardia.  He did have a brief atrial flutter but resolved spontaneously.  Urinalysis was negative.  Urine drug screen was positive for benzos which he was prescribed.  Arterial blood gas showed PCO2 of 47 but pH of 7.4.  No leukocytosis.  He had mild hypokalemia and mildly elevated AST and ALT but alcohol level was less than 10.  Chest x-ray did not show any acute cardiopulmonary disease.  CT head scan was negative for acute findings. Admission requested for acute metabolic encephalopathy and chest pain    Assessment/Plan:  Principal Problem:   Chest pain Active Problems:   COPD (chronic obstructive pulmonary disease) (HCC)   CAD (coronary artery disease)   GERD (gastroesophageal reflux disease)   Chronic respiratory failure (HCC)   Transient atrial fibrillation (HCC)   Hypokalemia   Acute metabolic encephalopathy   Chest pain, CAD, elevated troponin. Elevated troponins.  EKG with sinus tachycardia.  Received aspirin.  Continue aspirin and statins.  Cardiology on board.  Patient is a poor candidate for cardiac intervention.  Denies  current chest pain.  Patient has been started on p.o. metoprolol.  Acute metabolic encephalopathy with episodes of confusion and hallucinations. CT head without any acute findings.    History of mild cognitive decline.  Continue thiamine and folic acid.  No significant CO2 retention.  Ammonia was 14.  Has history of chronic respiratory failure on supplemental oxygen at home.  No signs of infection.  We will continue to hold benzodiazepines due to hallucinations and confusion.  No leukocytosis.  We will initiate the patient on CIWA protocol.   History of falls.  PT evaluation.   Chronic respiratory failure, COPD No acute exacerbation.  On 2 L of oxygen.  Continue albuterol as needed.  Avoid steroids.   Paroxysmal atrial fibrillation.  Cardiology on board.  Transient.  Controlled at this time.  Has been started on apixaban.  Continue aspirin.  2D echocardiogram shows LV ejection fraction of 65 to 70%.   Hypokalemia We will continue to replenish.  Check levels in a.m.   Hypomagnesemia.  Improved after replacement.  Hypophosphatemia. Will replenish.  Check levels in a.m.   GERD Continue PPI.  DVT prophylaxis: SCDs Start: 03/12/21 0452 apixaban (ELIQUIS) tablet 5 mg    Code Status:  Full code  Family Communication: none   Status is: Observation  The patient will require care spanning > 2 midnights and should be moved to inpatient because: IV treatments appropriate due to intensity of illness or inability to take PO and Inpatient level of care appropriate due to severity  of illness, encephalopathy,  Dispo: The patient is from: Home              Anticipated d/c is to:  undetermined              Patient currently is not medically stable to d/c.   Difficult to place patient No       Consultants: Cardiology  Procedures: None  Anti-infectives:  None  Anti-infectives (From admission, onward)    None        Subjective: Today, patient was seen and examined at bedside.  Taking about seeing trees and stuff. Had cough, mild chest pain on deep breathing.   Objective: Vitals:   03/13/21 0315 03/13/21 0725  BP: (!) 171/88   Pulse: 80   Resp: 18   Temp: 98 F (36.7 C)   SpO2: 92% 93%    Intake/Output Summary (Last 24 hours) at 03/13/2021 0806 Last data filed at 03/12/2021 2300 Gross per 24 hour  Intake 287 ml  Output --  Net 287 ml   Filed Weights   03/11/21 1834 03/12/21 1515  Weight: 54.9 kg 55.4 kg   Body mass index is 18.57 kg/m.   Physical Exam: GENERAL: Patient is alert awake and oriented to place and person.  Appears to be hallucinating at times.. Not in obvious distress. Thinly built, on nasal canula. HENT: No scleral pallor or icterus. Pupils equally reactive to light. Oral mucosa is moist NECK: is supple, no gross swelling noted. CHEST: Clear to auscultation. No crackles or wheezes.  Diminished breath sounds bilaterally. CVS: S1 and S2 heard, no murmur. Regular rate and rhythm.  ABDOMEN: Soft, non-tender, bowel sounds are present. EXTREMITIES: No edema. CNS: Cranial nerves are intact. No focal motor deficits. SKIN: warm and dry without rashes.  Data Review: I have personally reviewed the following laboratory data and studies,  CBC: Recent Labs  Lab 03/11/21 2050 03/12/21 0546 03/13/21 0341  WBC 8.1 7.3 8.8  NEUTROABS 5.8  --   --   HGB 16.4 15.1 15.0  HCT 51.7 47.6 48.2  MCV 83.8 83.5 83.0  PLT 318 281 [id number]   Basic Metabolic Panel: Recent Labs  Lab 03/11/21 2050 03/12/21 0546 03/13/21 0341  NA 138 136 136  K 3.3* 3.2* 3.2*  CL 97* 97* 97*  CO2 '31 29 29  '$ GLUCOSE 127* 148* 89  BUN '17 15 15  '$ CREATININE 0.85 0.73 0.78  CALCIUM 9.2 8.8* 8.9  MG  --  1.6* 2.1  PHOS  --   --  2.4*   Liver Function Tests: Recent Labs  Lab 03/11/21 2050 03/12/21 0546  AST 61* 55*  ALT 63* 58*  ALKPHOS 101 93  BILITOT 1.1 1.2  PROT 7.5 6.7  ALBUMIN 4.3 3.8   No results for input(s): LIPASE, AMYLASE in the last 168  hours. Recent Labs  Lab 03/11/21 2050  AMMONIA 14   Cardiac Enzymes: No results for input(s): CKTOTAL, CKMB, CKMBINDEX, TROPONINI in the last 168 hours. BNP (last 3 results) Recent Labs    03/11/21 2050  BNP 466.0*    ProBNP (last 3 results) No results for input(s): PROBNP in the last 8760 hours.  CBG: Recent Labs  Lab 03/11/21 2331  GLUCAP 105*   Recent Results (from the past 240 hour(s))  Resp Panel by RT-PCR (Flu A&B, Covid) Nasopharyngeal Swab     Status: None   Collection Time: 03/12/21 12:01 AM   Specimen: Nasopharyngeal Swab; Nasopharyngeal(NP) swabs in vial transport medium  Result Value  Ref Range Status   SARS Coronavirus 2 by RT PCR NEGATIVE NEGATIVE Final    Comment: (NOTE) SARS-CoV-2 target nucleic acids are NOT DETECTED.  The SARS-CoV-2 RNA is generally detectable in upper respiratory specimens during the acute phase of infection. The lowest concentration of SARS-CoV-2 viral copies this assay can detect is 138 copies/mL. A negative result does not preclude SARS-Cov-2 infection and should not be used as the sole basis for treatment or other patient management decisions. A negative result may occur with  improper specimen collection/handling, submission of specimen other than nasopharyngeal swab, presence of viral mutation(s) within the areas targeted by this assay, and inadequate number of viral copies(<138 copies/mL). A negative result must be combined with clinical observations, patient history, and epidemiological information. The expected result is Negative.  Fact Sheet for Patients:  EntrepreneurPulse.com.au  Fact Sheet for Healthcare Providers:  IncredibleEmployment.be  This test is no t yet approved or cleared by the [place] FDA and  has been authorized for detection and/or diagnosis of SARS-CoV-2 by FDA under an Emergency Use Authorization (EUA). This EUA will remain  in effect (meaning this test can  be used) for the duration of the COVID-19 declaration under Section 564(b)(1) of the Act, 21 U.S.C.section 360bbb-3(b)(1), unless the authorization is terminated  or revoked sooner.       Influenza A by PCR NEGATIVE NEGATIVE Final   Influenza B by PCR NEGATIVE NEGATIVE Final    Comment: (NOTE) The Xpert Xpress SARS-CoV-2/FLU/RSV plus assay is intended as an aid in the diagnosis of influenza from Nasopharyngeal swab specimens and should not be used as a sole basis for treatment. Nasal washings and aspirates are unacceptable for Xpert Xpress SARS-CoV-2/FLU/RSV testing.  Fact Sheet for Patients: EntrepreneurPulse.com.au  Fact Sheet for Healthcare Providers: IncredibleEmployment.be  This test is not yet approved or cleared by the [place] FDA and has been authorized for detection and/or diagnosis of SARS-CoV-2 by FDA under an Emergency Use Authorization (EUA). This EUA will remain in effect (meaning this test can be used) for the duration of the COVID-19 declaration under Section 564(b)(1) of the Act, 21 U.S.C. section 360bbb-3(b)(1), unless the authorization is terminated or revoked.  Performed at [facility], [address] [place], [place] [address]      Studies: CT Head Wo Contrast  Result Date: 03/11/2021 CLINICAL DATA:  Altered mental status. EXAM: CT HEAD WITHOUT CONTRAST TECHNIQUE: Contiguous axial images were obtained from the base of the skull through the vertex without intravenous contrast. COMPARISON:  October 04, 2020 FINDINGS: Brain: There is mild cerebral atrophy with widening of the extra-axial spaces and ventricular dilatation. There are areas of decreased attenuation within the white matter tracts of the supratentorial brain, consistent with microvascular disease changes. Small, chronic bilateral basal ganglia lacunar infarcts are seen. Vascular: No hyperdense vessel or unexpected calcification. Skull: Normal. Negative for  fracture or focal lesion. 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 Other: None. IMPRESSION: 1. No acute intracranial abnormality. 2. Generalized cerebral atrophy. Electronically Signed   By: Virgina Norfolk M.D.   On: 03/11/2021 21:17   MR BRAIN WO CONTRAST  Result Date: 03/12/2021 CLINICAL DATA:  Delirium EXAM: MRI HEAD WITHOUT CONTRAST TECHNIQUE: Multiplanar, multiecho pulse sequences of the brain and surrounding structures were obtained without intravenous contrast. COMPARISON:  08/29/2020 FINDINGS: Brain: Few small foci of cortical/subcortical diffusion hyperintensity in the left frontoparietal lobes with isointensity to hyperintensity on ADC. This is superimposed on chronic infarcts of the left precentral and postcentral gyri with chronic  blood products. There is no intracranial mass or mass effect. There is no hydrocephalus or extra-axial fluid collection. Additional patchy and confluent areas of T2 hyperintensity in the supratentorial and pontine white matter are nonspecific but probably reflect moderate chronic microvascular ischemic changes. Prominence of the ventricles and sulci reflects generalized parenchymal volume loss. Vascular: Major vessel flow voids at the skull base are preserved. Skull and upper cervical spine: Normal marrow signal is preserved. Sinuses/Orbits: Minor mucosal thickening.  Right ocular prosthesis. Other: Sella is unremarkable.  No significant mastoid opacification. IMPRESSION: Few small subacute infarcts in the left frontoparietal lobes. Moderate chronic microvascular ischemic changes. Chronic left frontoparietal infarcts. Electronically Signed   By: Macy Mis M.D.   On: 03/12/2021 14:05   DG Chest Portable 1 View  Result Date: 03/11/2021 CLINICAL DATA:  Cough and shortness of breath x 4 days. EXAM: PORTABLE CHEST 1 VIEW COMPARISON:  December 09, 2020 FINDINGS: Mild linear scarring is seen within the bilateral lung bases. There is no evidence of a pleural  effusion or pneumothorax. The heart size and mediastinal contours are within normal limits. The visualized skeletal structures are unremarkable. IMPRESSION: Stable exam without active cardiopulmonary disease. Electronically Signed   By: Virgina Norfolk M.D.   On: 03/11/2021 21:19   ECHOCARDIOGRAM COMPLETE  Result Date: 03/12/2021    ECHOCARDIOGRAM REPORT   Patient Name:   JEYCOB CHARETTE Date of Exam: 03/12/2021 Medical Rec #:  [id number]   Height:       69.0 in Accession #:    [id number]  Weight:       121.0 lb Date of Birth:  1951-08-03  BSA:          1.669 m Patient Age:    89 years    BP:           152/93 mmHg Patient Gender: M           HR:           91 bpm. Exam Location:  [facility] Procedure: 2D Echo, Cardiac Doppler and Color Doppler Indications:    Chest Pain  History:        Patient has prior history of Echocardiogram examinations, most                 recent 12/10/2020. Previous Myocardial Infarction and CAD, TIA                 and COPD, Arrythmias:Atrial Fibrillation,                 Signs/Symptoms:Dyspnea; Risk Factors:Current Smoker.  Sonographer:    Wenda Low Referring Phys: [id number] ASIA B Patterson  1. Left ventricular ejection fraction, by estimation, is 65 to 70%. The left ventricle has normal function. The left ventricle has no regional wall motion abnormalities. There is mild left ventricular hypertrophy. Left ventricular diastolic parameters are indeterminate.  2. Right ventricular systolic function was not well visualized. The right ventricular size is not well visualized. Tricuspid regurgitation signal is inadequate for assessing PA pressure.  3. A small pericardial effusion is present. The pericardial effusion is circumferential.  4. The mitral valve is normal in structure. No evidence of mitral valve regurgitation. No evidence of mitral stenosis.  5. The aortic valve was not well visualized. There is mild calcification of the aortic valve. There is mild thickening of  the aortic valve. Aortic valve regurgitation is not visualized. No aortic stenosis is present.  6. The inferior vena cava is normal in  size with greater than 50% respiratory variability, suggesting right atrial pressure of 3 mmHg. FINDINGS  Left Ventricle: Left ventricular ejection fraction, by estimation, is 65 to 70%. The left ventricle has normal function. The left ventricle has no regional wall motion abnormalities. The left ventricular internal cavity size was normal in size. There is  mild left ventricular hypertrophy. Left ventricular diastolic parameters are indeterminate. Right Ventricle: RV not well visualized, grossly appears normal in size and function. The right ventricular size is not well visualized. Right vetricular wall thickness was not well visualized. Right ventricular systolic function was not well visualized.  Tricuspid regurgitation signal is inadequate for assessing PA pressure. Left Atrium: Left atrial size was normal in size. Right Atrium: Right atrial size was normal in size. Pericardium: A small pericardial effusion is present. The pericardial effusion is circumferential. Mitral Valve: The mitral valve is normal in structure. No evidence of mitral valve regurgitation. No evidence of mitral valve stenosis. MV peak gradient, 3.8 mmHg. The mean mitral valve gradient is 2.0 mmHg. Tricuspid Valve: The tricuspid valve is not well visualized. Tricuspid valve regurgitation is not demonstrated. No evidence of tricuspid stenosis. Aortic Valve: The aortic valve was not well visualized. There is mild calcification of the aortic valve. There is mild thickening of the aortic valve. There is mild aortic valve annular calcification. Aortic valve regurgitation is not visualized. No aortic stenosis is present. Aortic valve mean gradient measures 2.0 mmHg. Aortic valve peak gradient measures 4.2 mmHg. Aortic valve area, by VTI measures 2.97 cm. Pulmonic Valve: The pulmonic valve was not well visualized.  Pulmonic valve regurgitation is not visualized. No evidence of pulmonic stenosis. Aorta: The aortic root is normal in size and structure. Venous: The inferior vena cava is normal in size with greater than 50% respiratory variability, suggesting right atrial pressure of 3 mmHg. IAS/Shunts: No atrial level shunt detected by color flow Doppler.  LEFT VENTRICLE PLAX 2D LVIDd:         3.85 cm LVIDs:         2.29 cm LV PW:         1.17 cm LV IVS:        1.28 cm LVOT diam:     2.10 cm LV SV:         63 LV SV Index:   38 LVOT Area:     3.46 cm  LEFT ATRIUM             Index LA diam:        3.60 cm 2.16 cm/m LA Vol (A2C):   32.3 ml 19.36 ml/m LA Vol (A4C):   34.3 ml 20.56 ml/m LA Biplane Vol: 35.3 ml 21.15 ml/m  AORTIC VALVE AV Area (Vmax):    3.04 cm AV Area (Vmean):   3.26 cm AV Area (VTI):     2.97 cm AV Vmax:           103.00 cm/s AV Vmean:          61.200 cm/s AV VTI:            0.212 m AV Peak Grad:      4.2 mmHg AV Mean Grad:      2.0 mmHg LVOT Vmax:         90.30 cm/s LVOT Vmean:        57.600 cm/s LVOT VTI:          0.182 m LVOT/AV VTI ratio: 0.86  AORTA Ao Root diam: 3.30 cm MITRAL VALVE  MV Area (PHT): 3.85 cm    SHUNTS MV Area VTI:   2.33 cm    Systemic VTI:  0.18 m MV Peak grad:  3.8 mmHg    Systemic Diam: 2.10 cm MV Mean grad:  2.0 mmHg MV Vmax:       0.97 m/s MV Vmean:      57.5 cm/s MV Decel Time: 197 msec MV E velocity: 64.80 cm/s MV A velocity: 68.40 cm/s MV E/A ratio:  0.95 Carlyle Dolly MD Electronically signed by Carlyle Dolly MD Signature Date/Time: 03/12/2021/12:28:24 PM    Final       Flora Lipps, MD  [facility] 03/13/2021  If 7PM-7AM, please contact night-coverage

## 2021-03-14 DIAGNOSIS — I25110 Atherosclerotic heart disease of native coronary artery with unstable angina pectoris: Secondary | ICD-10-CM | POA: Diagnosis not present

## 2021-03-14 DIAGNOSIS — R443 Hallucinations, unspecified: Secondary | ICD-10-CM | POA: Diagnosis not present

## 2021-03-14 DIAGNOSIS — R079 Chest pain, unspecified: Secondary | ICD-10-CM | POA: Diagnosis not present

## 2021-03-14 DIAGNOSIS — G9341 Metabolic encephalopathy: Secondary | ICD-10-CM | POA: Diagnosis not present

## 2021-03-14 LAB — CBC
HCT: 50.2 % (ref 39.0–52.0)
Hemoglobin: 15.5 g/dL (ref 13.0–17.0)
MCH: 25.7 pg — ABNORMAL LOW (ref 26.0–34.0)
MCHC: 30.9 g/dL (ref 30.0–36.0)
MCV: 83.3 fL (ref 80.0–100.0)
Platelets: 390 10*3/uL (ref 150–400)
RBC: 6.03 MIL/uL — ABNORMAL HIGH (ref 4.22–5.81)
RDW: 19 % — ABNORMAL HIGH (ref 11.5–15.5)
WBC: 6.7 10*3/uL (ref 4.0–10.5)
nRBC: 0 % (ref 0.0–0.2)

## 2021-03-14 LAB — COMPREHENSIVE METABOLIC PANEL
ALT: 64 U/L — ABNORMAL HIGH (ref 0–44)
AST: 56 U/L — ABNORMAL HIGH (ref 15–41)
Albumin: 3.7 g/dL (ref 3.5–5.0)
Alkaline Phosphatase: 89 U/L (ref 38–126)
Anion gap: 11 (ref 5–15)
BUN: 16 mg/dL (ref 8–23)
CO2: 28 mmol/L (ref 22–32)
Calcium: 9 mg/dL (ref 8.9–10.3)
Chloride: 98 mmol/L (ref 98–111)
Creatinine, Ser: 0.79 mg/dL (ref 0.61–1.24)
GFR, Estimated: >60 mL/min (ref >60)
Glucose, Bld: 75 mg/dL (ref 70–99)
Potassium: 3.7 mmol/L (ref 3.5–5.1)
Sodium: 137 mmol/L (ref 135–145)
Total Bilirubin: 1.6 mg/dL — ABNORMAL HIGH (ref 0.3–1.2)
Total Protein: 6.4 g/dL — ABNORMAL LOW (ref 6.5–8.1)

## 2021-03-14 LAB — MAGNESIUM: Magnesium: 2.1 mg/dL (ref 1.7–2.4)

## 2021-03-14 LAB — PHOSPHORUS: Phosphorus: 3.5 mg/dL (ref 2.5–4.6)

## 2021-03-14 MED ORDER — LORAZEPAM 1 MG PO TABS
1.0000 mg | ORAL_TABLET | Freq: Four times a day (QID) | ORAL | Status: DC | PRN
  Administered 2021-03-15: 1 mg via ORAL
  Filled 2021-03-14: qty 1

## 2021-03-14 MED ORDER — QUETIAPINE FUMARATE 25 MG PO TABS
25.0000 mg | ORAL_TABLET | Freq: Two times a day (BID) | ORAL | Status: DC
  Administered 2021-03-14 – 2021-03-16 (×5): 25 mg via ORAL
  Filled 2021-03-14 (×5): qty 1

## 2021-03-14 NOTE — Progress Notes (Signed)
Started lopressor yesterday for short runs of SVT, tele review looks fine, rates are fine. No changes. We will sign off inpatient care    Carlyle Dolly MD

## 2021-03-14 NOTE — Care Management Important Message (Signed)
Important Message  Patient Details  Name: Zachary Chambers MRN: [id number] Date of Birth: 12/21/1950   Medicare Important Message Given:  Yes     [name] 03/14/2021, 11:47 AM

## 2021-03-14 NOTE — Progress Notes (Signed)
PROGRESS NOTE  [name] [id number] DOB: January 27, 1951 DOA: 03/11/2021 PCP: [name], MD   LOS: 1 day   Brief narrative:  Zachary Chambers  is a 70 y.o. male, with history of COPD, GERD, hyperlipidemia, myocardial infarction, paroxysmal atrial fibrillation, presented to the ED with  the ED w complaints of chest pain and hallucinations.  As per the patient's wife he was confused and was seeing things and having conversations with people who were not there.  Patient also had fall at home and was wandering around.  Patient's wife stated that it could be because of him using too much of his inhalers.  Patient reports that he does not smoke, quit drinking in the end of 2021, does not use illicit drugs.  In the ED, blood pressure was mildly elevated with elevated respiratory rate on 3 L of nasal cannula usually uses 2.5 L of nasal cannula.  EKG showed sinus tachycardia.  He did have a brief atrial flutter but resolved spontaneously.  Urinalysis was negative.  Urine drug screen was positive for benzos which he was prescribed.  Arterial blood gas showed PCO2 of 47 but pH of 7.4.  No leukocytosis.  He had mild hypokalemia and mildly elevated AST and ALT but alcohol level was less than 10.  Chest x-ray did not show any acute cardiopulmonary disease.  CT head scan was negative for acute findings. Admission requested for acute metabolic encephalopathy and chest pain.   Assessment/Plan:  Principal Problem:   Chest pain Active Problems:   COPD (chronic obstructive pulmonary disease) (HCC)   CAD (coronary artery disease)   GERD (gastroesophageal reflux disease)   Chronic respiratory failure (HCC)   Transient atrial fibrillation (HCC)   Hypokalemia   Acute metabolic encephalopathy   Metabolic encephalopathy   Chest pain, CAD, elevated troponin. Elevated troponins.  EKG with sinus tachycardia.  Received aspirin.  Continue aspirin and statins.  Cardiology on board.  Patient is a poor candidate for  cardiac intervention.  Denies current chest pain.  Patient has been started on p.o. metoprolol.  Acute metabolic encephalopathy with episodes of confusion and hallucinations with subacute stroke. CT head without any acute findings.    MRI  head with subacute infarcts. History of mild cognitive decline.  Continue thiamine and folic acid.  No significant CO2 retention.  Ammonia was 14.  Has history of chronic respiratory failure on supplemental oxygen at home.  No signs of infection.  Will add seroquel, dc ativan.   History of falls.  PT evaluation recommend home Health PT   Chronic respiratory failure, COPD No acute exacerbation.  On 2 L of oxygen.  Continue albuterol as needed.  Avoid steroids.   Paroxysmal atrial fibrillation.  Cardiology on board.  Transient.  Controlled at this time.  Has been started on apixaban.  Continue aspirin.  2D echocardiogram shows LV ejection fraction of 65 to 70%.   Hypokalemia Improved.    Hypomagnesemia.  Improved after replacement.  Hypophosphatemia. Replenished. Check levels in am.   GERD Continue PPI.  DVT prophylaxis: SCDs Start: 03/12/21 0452 apixaban (ELIQUIS) tablet 5 mg    Code Status:  Full code  Family Communication: spoke with the significant other.   Status is: Observation  The patient will require care spanning > 2 midnights and should be moved to inpatient because: IV treatments appropriate due to intensity of illness or inability to take PO and Inpatient level of care appropriate due to severity of illness, encephalopathy,  Dispo: The patient is  from: Home              Anticipated d/c is to:  undetermined              Patient currently is not medically stable to d/c.   Difficult to place patient No   Consultants: Cardiology  Procedures: None  Anti-infectives:  None  Anti-infectives (From admission, onward)    None      Subjective:  Today, patient was seen and examined at bedside. Brother at bedside. Appears to  hallucinating yesterday. Patient significant other believes that he is using too much puffs.    Objective: Vitals:   03/14/21 0752 03/14/21 1241  BP:  (!) 157/72  Pulse:  78  Resp:  18  Temp:  97.7 F (36.5 C)  SpO2: 91% 96%    Intake/Output Summary (Last 24 hours) at 03/14/2021 1244 Last data filed at 03/14/2021 0927 Gross per 24 hour  Intake 840 ml  Output 1150 ml  Net -310 ml    Filed Weights   03/11/21 1834 03/12/21 1515  Weight: 54.9 kg 55.4 kg   Body mass index is 18.57 kg/m.   Physical Exam:  GENERAL: Patient is alert awake and oriented to time but disoriented to   Appears to be hallucinating at times.. Not in obvious distress. Thinly built, on nasal canula. HENT: No scleral pallor or icterus. Pupils equally reactive to light. Oral mucosa is moist NECK: is supple, no gross swelling noted. CHEST: Clear to auscultation. No crackles or wheezes.  Diminished breath sounds bilaterally. CVS: S1 and S2 heard, no murmur. Regular rate and rhythm.  ABDOMEN: Soft, non-tender, bowel sounds are present. EXTREMITIES: No edema. CNS: Cranial nerves are intact. No focal motor deficits. SKIN: warm and dry without rashes.  Data Review: I have personally reviewed the following laboratory data and studies,  CBC: Recent Labs  Lab 03/11/21 2050 03/12/21 0546 03/13/21 0341 03/14/21 0621  WBC 8.1 7.3 8.8 6.7  NEUTROABS 5.8  --   --   --   HGB 16.4 15.1 15.0 15.5  HCT 51.7 47.6 48.2 50.2  MCV 83.8 83.5 83.0 83.3  PLT 318 281 347 [id number]    Basic Metabolic Panel: Recent Labs  Lab 03/11/21 2050 03/12/21 0546 03/13/21 0341 03/14/21 0621  NA 138 136 136 137  K 3.3* 3.2* 3.2* 3.7  CL 97* 97* 97* 98  CO2 '31 29 29 28  '$ GLUCOSE 127* 148* 89 75  BUN '17 15 15 16  '$ CREATININE 0.85 0.73 0.78 0.79  CALCIUM 9.2 8.8* 8.9 9.0  MG  --  1.6* 2.1 2.1  PHOS  --   --  2.4* 3.5    Liver Function Tests: Recent Labs  Lab 03/11/21 2050 03/12/21 0546 03/14/21 0621  AST 61* 55* 56*  ALT  63* 58* 64*  ALKPHOS 101 93 89  BILITOT 1.1 1.2 1.6*  PROT 7.5 6.7 6.4*  ALBUMIN 4.3 3.8 3.7    No results for input(s): LIPASE, AMYLASE in the last 168 hours. Recent Labs  Lab 03/11/21 2050  AMMONIA 14    Cardiac Enzymes: No results for input(s): CKTOTAL, CKMB, CKMBINDEX, TROPONINI in the last 168 hours. BNP (last 3 results) Recent Labs    03/11/21 2050  BNP 466.0*     ProBNP (last 3 results) No results for input(s): PROBNP in the last 8760 hours.  CBG: Recent Labs  Lab 03/11/21 2331  GLUCAP 105*    Recent Results (from the past 240 hour(s))  Urine Culture  Status: Abnormal   Collection Time: 03/12/21 12:01 AM   Specimen: Urine, Clean Catch  Result Value Ref Range Status   Specimen Description   Final    URINE, CLEAN CATCH Performed at [facility], [address], [place], [place] [address]    Special Requests   Final    NONE Performed at [facility], [address], [place], [place] [address]    Culture (A)  Final    <10,000 COLONIES/mL INSIGNIFICANT GROWTH Performed at [facility] [address] [place], [place] [address]    Report Status 03/13/2021 FINAL  Final  Resp Panel by RT-PCR (Flu A&B, Covid) Nasopharyngeal Swab     Status: None   Collection Time: 03/12/21 12:01 AM   Specimen: Nasopharyngeal Swab; Nasopharyngeal(NP) swabs in vial transport medium  Result Value Ref Range Status   SARS Coronavirus 2 by RT PCR NEGATIVE NEGATIVE Final    Comment: (NOTE) SARS-CoV-2 target nucleic acids are NOT DETECTED.  The SARS-CoV-2 RNA is generally detectable in upper respiratory specimens during the acute phase of infection. The lowest concentration of SARS-CoV-2 viral copies this assay can detect is 138 copies/mL. A negative result does not preclude SARS-Cov-2 infection and should not be used as the sole basis for treatment or other patient management decisions. A negative result may occur with  improper specimen collection/handling, submission  of specimen other than nasopharyngeal swab, presence of viral mutation(s) within the areas targeted by this assay, and inadequate number of viral copies(<138 copies/mL). A negative result must be combined with clinical observations, patient history, and epidemiological information. The expected result is Negative.  Fact Sheet for Patients:  EntrepreneurPulse.com.au  Fact Sheet for Healthcare Providers:  IncredibleEmployment.be  This test is no t yet approved or cleared by the [place] FDA and  has been authorized for detection and/or diagnosis of SARS-CoV-2 by FDA under an Emergency Use Authorization (EUA). This EUA will remain  in effect (meaning this test can be used) for the duration of the COVID-19 declaration under Section 564(b)(1) of the Act, 21 U.S.C.section 360bbb-3(b)(1), unless the authorization is terminated  or revoked sooner.       Influenza A by PCR NEGATIVE NEGATIVE Final   Influenza B by PCR NEGATIVE NEGATIVE Final    Comment: (NOTE) The Xpert Xpress SARS-CoV-2/FLU/RSV plus assay is intended as an aid in the diagnosis of influenza from Nasopharyngeal swab specimens and should not be used as a sole basis for treatment. Nasal washings and aspirates are unacceptable for Xpert Xpress SARS-CoV-2/FLU/RSV testing.  Fact Sheet for Patients: EntrepreneurPulse.com.au  Fact Sheet for Healthcare Providers: IncredibleEmployment.be  This test is not yet approved or cleared by the [place] FDA and has been authorized for detection and/or diagnosis of SARS-CoV-2 by FDA under an Emergency Use Authorization (EUA). This EUA will remain in effect (meaning this test can be used) for the duration of the COVID-19 declaration under Section 564(b)(1) of the Act, 21 U.S.C. section 360bbb-3(b)(1), unless the authorization is terminated or revoked.  Performed at [facility], [address],  [place], [place] [address]       Studies: MR BRAIN WO CONTRAST  Result Date: 03/12/2021 CLINICAL DATA:  Delirium EXAM: MRI HEAD WITHOUT CONTRAST TECHNIQUE: Multiplanar, multiecho pulse sequences of the brain and surrounding structures were obtained without intravenous contrast. COMPARISON:  08/29/2020 FINDINGS: Brain: Few small foci of cortical/subcortical diffusion hyperintensity in the left frontoparietal lobes with isointensity to hyperintensity on ADC. This is superimposed on chronic infarcts of the left precentral  and postcentral gyri with chronic blood products. There is no intracranial mass or mass effect. There is no hydrocephalus or extra-axial fluid collection. Additional patchy and confluent areas of T2 hyperintensity in the supratentorial and pontine white matter are nonspecific but probably reflect moderate chronic microvascular ischemic changes. Prominence of the ventricles and sulci reflects generalized parenchymal volume loss. Vascular: Major vessel flow voids at the skull base are preserved. Skull and upper cervical spine: Normal marrow signal is preserved. Sinuses/Orbits: Minor mucosal thickening.  Right ocular prosthesis. Other: Sella is unremarkable.  No significant mastoid opacification. IMPRESSION: Few small subacute infarcts in the left frontoparietal lobes. Moderate chronic microvascular ischemic changes. Chronic left frontoparietal infarcts. Electronically Signed   By: Macy Mis M.D.   On: 03/12/2021 14:05      Flora Lipps, MD  [facility] 03/14/2021  If 7PM-7AM, please contact night-coverage

## 2021-03-14 NOTE — Progress Notes (Signed)
Pt removed tele and refuses to allow this nurse to put it back on. MD notified

## 2021-03-15 ENCOUNTER — Encounter (HOSPITAL_COMMUNITY): Payer: Self-pay | Admitting: Family Medicine

## 2021-03-15 DIAGNOSIS — R079 Chest pain, unspecified: Secondary | ICD-10-CM | POA: Diagnosis not present

## 2021-03-15 DIAGNOSIS — G9341 Metabolic encephalopathy: Secondary | ICD-10-CM | POA: Diagnosis not present

## 2021-03-15 DIAGNOSIS — R443 Hallucinations, unspecified: Secondary | ICD-10-CM | POA: Diagnosis not present

## 2021-03-15 DIAGNOSIS — I25110 Atherosclerotic heart disease of native coronary artery with unstable angina pectoris: Secondary | ICD-10-CM | POA: Diagnosis not present

## 2021-03-15 NOTE — Progress Notes (Signed)
PROGRESS NOTE  HONG NIEWINSKI [id number] DOB: 1950-10-30 DOA: 03/11/2021 PCP: [name], MD   LOS: 2 days   Brief narrative:  Zachary Chambers  is a 70 y.o. male, with history of COPD, GERD, hyperlipidemia, myocardial infarction, paroxysmal atrial fibrillation, presented to the ED with  the ED w complaints of chest pain and hallucinations.  As per the patient's wife he was confused and was seeing things and having conversations with people who were not there.  Patient also had fall at home and was wandering around.  Patient's wife stated that it could be because of him using too much of his inhalers.  Patient reports that he does not smoke, quit drinking in the end of 2021, does not use illicit drugs.  In the ED, blood pressure was mildly elevated with elevated respiratory rate on 3 L of nasal cannula usually uses 2.5 L of nasal cannula.  EKG showed sinus tachycardia.  He did have a brief atrial flutter but resolved spontaneously.  Urinalysis was negative.  Urine drug screen was positive for benzos which he was prescribed.  Arterial blood gas showed PCO2 of 47 but pH of 7.4.  No leukocytosis.  He had mild hypokalemia and mildly elevated AST and ALT but alcohol level was less than 10.  Chest x-ray did not show any acute cardiopulmonary disease.  CT head scan was negative for acute findings. Admission requested for acute metabolic encephalopathy and chest pain.   Assessment/Plan:  Principal Problem:   Chest pain Active Problems:   COPD (chronic obstructive pulmonary disease) (HCC)   CAD (coronary artery disease)   GERD (gastroesophageal reflux disease)   Chronic respiratory failure (HCC)   Transient atrial fibrillation (HCC)   Hypokalemia   Acute metabolic encephalopathy   Metabolic encephalopathy  Chest pain, CAD, elevated troponin. Elevated troponins.  EKG with sinus tachycardia.  Seen by cardiology.  Continue aspirin and statins.  Patient is a poor candidate for cardiac intervention.   Has been started on metoprolol.    Acute metabolic encephalopathy with episodes of confusion and hallucinations with subacute stroke.  CT head without any acute findings.    MRI  head with subacute infarcts. History of mild cognitive decline.  Continue thiamine and folic acid.  No significant CO2 retention.  Ammonia was 14.  Has history of chronic respiratory failure on supplemental oxygen at home.  No signs of infection.  Seroquel has been initiated.  Discontinued Ativan.  Patient has been started on Eliquis.  Patient used to be on Eliquis but was not taking it due to financial constraints.  I had a prolonged discussion with the patient's significant other yesterday she requested medication assistance on discharge.  We will consult transition of care.   History of falls.  PT evaluation recommend home Health PT on discharge.   Chronic respiratory failure, COPD No acute exacerbation.  On 2 L of oxygen.  Continue albuterol as needed.  Avoid steroids.   Paroxysmal atrial fibrillation.  Cardiology on board.  Transient.  Controlled at this time.  Continue apixaban.  Continue aspirin.  2D echocardiogram shows LV ejection fraction of 65 to 70%.   Hypokalemia Improved.    Hypomagnesemia.  Improved after replacement.  Hypophosphatemia. Replenished. Check levels in am.   GERD Continue PPI.  DVT prophylaxis: SCDs Start: 03/12/21 0452 apixaban (ELIQUIS) tablet 5 mg    Code Status:  Full code  Family Communication: spoke with the significant other yesterday..   Status is: Observation  The patient will require  care spanning > 2 midnights and should be moved to inpatient because: IV treatments appropriate due to intensity of illness or inability to take PO and Inpatient level of care appropriate due to severity of illness, encephalopathy,  Dispo: The patient is from: Home              Anticipated d/c is to: Home with home health              Patient currently is not medically stable to d/c.    Difficult to place patient No   Consultants: Cardiology  Procedures: None  Anti-infectives:  None  Anti-infectives (From admission, onward)    None      Subjective:  Today, patient was seen and examined at bedside.  Appears to be little more calmer.  Denies chest pain today.  Objective: Vitals:   03/15/21 0528 03/15/21 0755  BP: 134/86   Pulse: 76   Resp: 19   Temp: 97.8 F (36.6 C)   SpO2: (!) 89% 94%    Intake/Output Summary (Last 24 hours) at 03/15/2021 1328 Last data filed at 03/15/2021 0900 Gross per 24 hour  Intake 960 ml  Output 1250 ml  Net -290 ml    Filed Weights   03/11/21 1834 03/12/21 1515  Weight: 54.9 kg 55.4 kg   Body mass index is 18.57 kg/m.   Physical Exam:  General: Thinly built, not in obvious distress, disoriented.  On nasal cannula. HENT:   No scleral pallor or icterus noted. Oral mucosa is moist.  Chest:  Clear breath sounds.  Diminished breath sounds bilaterally. No crackles or wheezes.  CVS: S1 &S2 heard. No murmur.  Regular rate and rhythm. Abdomen: Soft, nontender, nondistended.  Bowel sounds are heard.   Extremities: No cyanosis, clubbing or edema.  Peripheral pulses are palpable. Psych: Alert, awake and oriented, normal mood CNS:  No cranial nerve deficits.  Power equal in all extremities.   Skin: Warm and dry.  No rashes noted.   Data Review: I have personally reviewed the following laboratory data and studies,  CBC: Recent Labs  Lab 03/11/21 2050 03/12/21 0546 03/13/21 0341 03/14/21 0621  WBC 8.1 7.3 8.8 6.7  NEUTROABS 5.8  --   --   --   HGB 16.4 15.1 15.0 15.5  HCT 51.7 47.6 48.2 50.2  MCV 83.8 83.5 83.0 83.3  PLT 318 281 347 [id number]    Basic Metabolic Panel: Recent Labs  Lab 03/11/21 2050 03/12/21 0546 03/13/21 0341 03/14/21 0621  NA 138 136 136 137  K 3.3* 3.2* 3.2* 3.7  CL 97* 97* 97* 98  CO2 '31 29 29 28  '$ GLUCOSE 127* 148* 89 75  BUN '17 15 15 16  '$ CREATININE 0.85 0.73 0.78 0.79  CALCIUM 9.2 8.8*  8.9 9.0  MG  --  1.6* 2.1 2.1  PHOS  --   --  2.4* 3.5    Liver Function Tests: Recent Labs  Lab 03/11/21 2050 03/12/21 0546 03/14/21 0621  AST 61* 55* 56*  ALT 63* 58* 64*  ALKPHOS 101 93 89  BILITOT 1.1 1.2 1.6*  PROT 7.5 6.7 6.4*  ALBUMIN 4.3 3.8 3.7    No results for input(s): LIPASE, AMYLASE in the last 168 hours. Recent Labs  Lab 03/11/21 2050  AMMONIA 14    Cardiac Enzymes: No results for input(s): CKTOTAL, CKMB, CKMBINDEX, TROPONINI in the last 168 hours. BNP (last 3 results) Recent Labs    03/11/21 2050  BNP 466.0*  ProBNP (last 3 results) No results for input(s): PROBNP in the last 8760 hours.  CBG: Recent Labs  Lab 03/11/21 2331  GLUCAP 105*    Recent Results (from the past 240 hour(s))  Urine Culture     Status: Abnormal   Collection Time: 03/12/21 12:01 AM   Specimen: Urine, Clean Catch  Result Value Ref Range Status   Specimen Description   Final    URINE, CLEAN CATCH Performed at [facility], [address], [place], [place] [address]    Special Requests   Final    NONE Performed at [facility], [address], [place], [place] [address]    Culture (A)  Final    <10,000 COLONIES/mL INSIGNIFICANT GROWTH Performed at [facility], [place] [address], [place], [place] [address]    Report Status 03/13/2021 FINAL  Final  Resp Panel by RT-PCR (Flu A&B, Covid) Nasopharyngeal Swab     Status: None   Collection Time: 03/12/21 12:01 AM   Specimen: Nasopharyngeal Swab; Nasopharyngeal(NP) swabs in vial transport medium  Result Value Ref Range Status   SARS Coronavirus 2 by RT PCR NEGATIVE NEGATIVE Final    Comment: (NOTE) SARS-CoV-2 target nucleic acids are NOT DETECTED.  The SARS-CoV-2 RNA is generally detectable in upper respiratory specimens during the acute phase of infection. The lowest concentration of SARS-CoV-2 viral copies this assay can detect is 138 copies/mL. A negative result does not preclude SARS-Cov-2 infection and  should not be used as the sole basis for treatment or other patient management decisions. A negative result may occur with  improper specimen collection/handling, submission of specimen other than nasopharyngeal swab, presence of viral mutation(s) within the areas targeted by this assay, and inadequate number of viral copies(<138 copies/mL). A negative result must be combined with clinical observations, patient history, and epidemiological information. The expected result is Negative.  Fact Sheet for Patients:  EntrepreneurPulse.com.au  Fact Sheet for Healthcare Providers:  IncredibleEmployment.be  This test is no t yet approved or cleared by the [place] FDA and  has been authorized for detection and/or diagnosis of SARS-CoV-2 by FDA under an Emergency Use Authorization (EUA). This EUA will remain  in effect (meaning this test can be used) for the duration of the COVID-19 declaration under Section 564(b)(1) of the Act, 21 U.S.C.section 360bbb-3(b)(1), unless the authorization is terminated  or revoked sooner.       Influenza A by PCR NEGATIVE NEGATIVE Final   Influenza B by PCR NEGATIVE NEGATIVE Final    Comment: (NOTE) The Xpert Xpress SARS-CoV-2/FLU/RSV plus assay is intended as an aid in the diagnosis of influenza from Nasopharyngeal swab specimens and should not be used as a sole basis for treatment. Nasal washings and aspirates are unacceptable for Xpert Xpress SARS-CoV-2/FLU/RSV testing.  Fact Sheet for Patients: EntrepreneurPulse.com.au  Fact Sheet for Healthcare Providers: IncredibleEmployment.be  This test is not yet approved or cleared by the [place] FDA and has been authorized for detection and/or diagnosis of SARS-CoV-2 by FDA under an Emergency Use Authorization (EUA). This EUA will remain in effect (meaning this test can be used) for the duration of the COVID-19 declaration  under Section 564(b)(1) of the Act, 21 U.S.C. section 360bbb-3(b)(1), unless the authorization is terminated or revoked.  Performed at [facility], [address], [place], [place] [address]       Studies: No results found.    Flora Lipps, MD  [facility] 03/15/2021  If 7PM-7AM, please contact night-coverage

## 2021-03-16 DIAGNOSIS — J9611 Chronic respiratory failure with hypoxia: Secondary | ICD-10-CM | POA: Diagnosis not present

## 2021-03-16 DIAGNOSIS — I25110 Atherosclerotic heart disease of native coronary artery with unstable angina pectoris: Secondary | ICD-10-CM | POA: Diagnosis not present

## 2021-03-16 DIAGNOSIS — G9341 Metabolic encephalopathy: Secondary | ICD-10-CM | POA: Diagnosis not present

## 2021-03-16 DIAGNOSIS — J449 Chronic obstructive pulmonary disease, unspecified: Secondary | ICD-10-CM

## 2021-03-16 DIAGNOSIS — R079 Chest pain, unspecified: Secondary | ICD-10-CM | POA: Diagnosis not present

## 2021-03-16 MED ORDER — MAGNESIUM OXIDE -MG SUPPLEMENT 400 (240 MG) MG PO TABS: 400.0000 mg | ORAL_TABLET | Freq: Every day | ORAL | 0 refills | Status: DC

## 2021-03-16 MED ORDER — APIXABAN 5 MG PO TABS: 5.0000 mg | ORAL_TABLET | Freq: Two times a day (BID) | ORAL | 3 refills | Status: DC

## 2021-03-16 MED ORDER — METOPROLOL TARTRATE 25 MG PO TABS: 25.0000 mg | ORAL_TABLET | Freq: Two times a day (BID) | ORAL | 3 refills | Status: DC

## 2021-03-16 MED ORDER — THIAMINE HCL 100 MG PO TABS: 500.0000 mg | ORAL_TABLET | Freq: Every day | ORAL | 0 refills | Status: DC

## 2021-03-16 MED ORDER — QUETIAPINE FUMARATE 25 MG PO TABS: 25.0000 mg | ORAL_TABLET | Freq: Every day | ORAL | 0 refills | Status: DC

## 2021-03-16 NOTE — TOC Transition Note (Addendum)
Transition of Care [facility]) - CM/SW Discharge Note   Patient Details  Name: Zachary Chambers MRN: [id number] Date of Birth: 1951/01/06  Transition of Care [facility]) CM/SW Contact:  Natasha Bence, LCSW Phone Number: 03/16/2021, 9:21 AM   Clinical Narrative:    CSW notified of patient's readiness for discharge. CSW notified Marjory Lies with [facility]. Marjory Lies agreeable to provide services to patient. TOC signing off.   Addendum CSW notified of patient's affordability difficulties with Eliquis. CSW contacted pharmacy. Pharmacy reported that due to patient already receiving Eliquis that voucher will likely not be valid. CSW was able to identify manufacture Copay card for Eliquis. CSW inquired if patient and family would be agreeable to apply for Card. Patient and spouse agreeable. CSW submitted info for Eliquis Copay card. TOC signing off.   Final next level of care: [place] Barriers to Discharge: Barriers Resolved   Patient Goals and CMS Choice Patient states their goals for this hospitalization and ongoing recovery are:: Return home with [facility] CMS Medicare.gov Compare Post Acute Care list provided to:: Patient Choice offered to / list presented to : Patient  Discharge Placement                    Patient and family notified of of transfer: 03/16/21  Discharge Plan and Services In-house Referral: Clinical Social Work Discharge Planning Services: CM Consult Post Acute Care Choice: Home Health                    HH Arranged: RN, PT [facility] Agency: [place] Date [place]: 03/16/21 Time [place]: [phone] Representative spoke with at [place]: [place] ([place]) Interventions     Readmission Risk Interventions Readmission Risk Prevention Plan 03/13/2021  Transportation Screening Complete  Home Care Screening Complete  Medication Review (RN CM) Complete  Some recent data might be hidden

## 2021-03-16 NOTE — Progress Notes (Signed)
Pt has discharge orders. Patients wife Lelon Frohlich has been contacted four different times and this nurse cant get a hold of her, voicemail left to return call back to facility. Pts IV removed, tolerated well. Discharge teaching discussed with pt and new prescriptions, will also have conversation with pts wife as well. Awaiting call back at this time.

## 2021-03-17 ENCOUNTER — Telehealth: Payer: Medicare HMO

## 2021-03-17 NOTE — Discharge Summary (Addendum)
Physician Discharge Summary  Zachary Chambers [id number] DOB: 1950/10/25 DOA: 03/11/2021  PCP: [name], MD  Admit date: 03/11/2021 Discharge date: 03/16/2021  Admitted From: Home  Discharge disposition: Home with home health  Recommendations for Outpatient Follow-Up:   Follow up with your primary care provider in one week.  Check CBC, BMP, magnesium in the next visit  Discharge Diagnosis:   Principal Problem:   Chest pain Active Problems:   COPD (chronic obstructive pulmonary disease) (HCC)   CAD (coronary artery disease)   GERD (gastroesophageal reflux disease)   Chronic respiratory failure (HCC)   Transient atrial fibrillation (HCC)   Hypokalemia   Acute metabolic encephalopathy   Metabolic encephalopathy   Discharge Condition: Improved.  Diet recommendation: Low sodium, heart healthy.   Wound care: None.  Code status: Full.   History of Present Illness:   Zachary Chambers  is a 70 y.o. male, with history of COPD, GERD, hyperlipidemia, myocardial infarction, paroxysmal atrial fibrillation, presented to the ED with   complaints of chest pain and hallucinations.  As per the patient's wife he was confused and was seeing things and having conversations with people who were not there.  Patient also had fall at home and was wandering around.  Patient's wife stated that it could be because of him using too much of his inhalers.  Patient reports that he does not smoke, quit drinking in the end of 2021, does not use illicit drugs.  In the ED, blood pressure was mildly elevated with elevated respiratory rate on 3 L of nasal cannula usually uses 2.5 L of nasal cannula.  EKG showed sinus tachycardia.  He did have a brief atrial flutter but resolved spontaneously.  Urinalysis was negative.  Urine drug screen was positive for benzos which he was prescribed.  Arterial blood gas showed PCO2 of 47 but pH of 7.4.  No leukocytosis.  He had mild hypokalemia and mildly elevated AST and ALT  but alcohol level was less than 10.  Chest x-ray did not show any acute cardiopulmonary disease.  CT head scan was negative for acute findings.  Patient was then admitted hospital for metabolic encephalopathy and chest pain.   Hospital Course:   Following conditions were addressed during hospitalization as listed below,  Chest pain, CAD, elevated troponin. Elevated troponins.  EKG with sinus tachycardia.  Seen by cardiology.  Recommendation was to continue aspirin and statins.  Patient is a poor candidate for cardiac intervention.  Has been started on metoprolol.  This will be continued on discharge.   Acute metabolic encephalopathy with episodes of confusion and hallucinations secondary to subacute stroke.   CT head without any acute findings.    MRI  head with subacute infarcts. History of mild cognitive decline.  Received thiamine and folic acid.  No significant CO2 retention.  Ammonia was 14.  Has history of chronic respiratory failure on supplemental oxygen at home.  No signs of infection.  Seroquel was initiated with improved symptoms.    Patient used to be on Eliquis but was not taking it due to financial constraints.  I had a prolonged discussion with the patient's significant other who requested medication assistance on discharge.  Transition of care was consulted for Eliquis assistance.   History of falls.  PT evaluation recommend home Health PT on discharge.  This will be arranged on discharge.   Chronic respiratory failure, COPD No acute exacerbation.  On 2 L of oxygen.  Resume on discharge.  Paroxysmal atrial fibrillation.  Cardiology on board.    Controlled at this time.  Continue apixaban.  Continue aspirin.  2D echocardiogram showed LV ejection fraction of 65 to 70%.   Hypokalemia Improved.  Latest potassium of 3.7.   Hypomagnesemia.  Improved after replacement.  Latest magnesium of 2.1   Hypophosphatemia. Replenished.  Latest phosphate of 3.5   GERD Continue PPI on  discharge..  Disposition.  At this time, patient is stable for disposition home with outpatient PCP follow-up.  Medical Consultants:   Cardiology  Procedures:    None Subjective:   Today, patient was seen and examined at bedside.  Appears stable.  Denies any chest pain, cough or increasing shortness of breath.  Discharge Exam:   Vitals:   03/16/21 0438 03/16/21 0812  BP: 128/81   Pulse: 75   Resp: 18   Temp: 98.5 F (36.9 C)   SpO2: 96% 94%   Vitals:   03/15/21 1918 03/15/21 2124 03/16/21 0438 03/16/21 0812  BP:  132/75 128/81   Pulse: 94 91 75   Resp: '18 18 18   '$ Temp:  97.9 F (36.6 C) 98.5 F (36.9 C)   TempSrc:   Oral   SpO2: 96% 95% 96% 94%  Weight:      Height:       General: Alert awake, not in obvious distress, thinly built, on nasal cannula oxygen HENT: pupils equally reacting to light,  No scleral pallor or icterus noted. Oral mucosa is moist.  Chest:  Clear breath sounds.  Diminished breath sounds bilaterally. No crackles or wheezes.  CVS: S1 &S2 heard. No murmur.  Regular rate and rhythm. Abdomen: Soft, nontender, nondistended.  Bowel sounds are heard.   Extremities: No cyanosis, clubbing or edema.  Peripheral pulses are palpable. Psych: Alert, awake and oriented, normal mood CNS:  No cranial nerve deficits.  Power equal in all extremities.   Skin: Warm and dry.  No rashes noted.  The results of significant diagnostics from this hospitalization (including imaging, microbiology, ancillary and laboratory) are listed below for reference.     Diagnostic Studies:   CT Head Wo Contrast  Result Date: 03/11/2021 CLINICAL DATA:  Altered mental status. EXAM: CT HEAD WITHOUT CONTRAST TECHNIQUE: Contiguous axial images were obtained from the base of the skull through the vertex without intravenous contrast. COMPARISON:  October 04, 2020 FINDINGS: Brain: There is mild cerebral atrophy with widening of the extra-axial spaces and ventricular dilatation. There are  areas of decreased attenuation within the white matter tracts of the supratentorial brain, consistent with microvascular disease changes. Small, chronic bilateral basal ganglia lacunar infarcts are seen. Vascular: No hyperdense vessel or unexpected calcification. Skull: Normal. Negative for fracture or focal lesion. 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 Other: None. IMPRESSION: 1. No acute intracranial abnormality. 2. Generalized cerebral atrophy. Electronically Signed   By: Virgina Norfolk M.D.   On: 03/11/2021 21:17   MR BRAIN WO CONTRAST  Result Date: 03/12/2021 CLINICAL DATA:  Delirium EXAM: MRI HEAD WITHOUT CONTRAST TECHNIQUE: Multiplanar, multiecho pulse sequences of the brain and surrounding structures were obtained without intravenous contrast. COMPARISON:  08/29/2020 FINDINGS: Brain: Few small foci of cortical/subcortical diffusion hyperintensity in the left frontoparietal lobes with isointensity to hyperintensity on ADC. This is superimposed on chronic infarcts of the left precentral and postcentral gyri with chronic blood products. There is no intracranial mass or mass effect. There is no hydrocephalus or extra-axial fluid collection. Additional patchy and confluent areas of T2 hyperintensity in the supratentorial and pontine white matter  are nonspecific but probably reflect moderate chronic microvascular ischemic changes. Prominence of the ventricles and sulci reflects generalized parenchymal volume loss. Vascular: Major vessel flow voids at the skull base are preserved. Skull and upper cervical spine: Normal marrow signal is preserved. Sinuses/Orbits: Minor mucosal thickening.  Right ocular prosthesis. Other: Sella is unremarkable.  No significant mastoid opacification. IMPRESSION: Few small subacute infarcts in the left frontoparietal lobes. Moderate chronic microvascular ischemic changes. Chronic left frontoparietal infarcts. Electronically Signed   By: Macy Mis M.D.   On:  03/12/2021 14:05   DG Chest Portable 1 View  Result Date: 03/11/2021 CLINICAL DATA:  Cough and shortness of breath x 4 days. EXAM: PORTABLE CHEST 1 VIEW COMPARISON:  December 09, 2020 FINDINGS: Mild linear scarring is seen within the bilateral lung bases. There is no evidence of a pleural effusion or pneumothorax. The heart size and mediastinal contours are within normal limits. The visualized skeletal structures are unremarkable. IMPRESSION: Stable exam without active cardiopulmonary disease. Electronically Signed   By: Virgina Norfolk M.D.   On: 03/11/2021 21:19   ECHOCARDIOGRAM COMPLETE  Result Date: 03/12/2021    ECHOCARDIOGRAM REPORT   Patient Name:   RAMIRO COLE Date of Exam: 03/12/2021 Medical Rec #:  [id number]   Height:       69.0 in Accession #:    [id number]  Weight:       121.0 lb Date of Birth:  [birth year]  BSA:          1.669 m Patient Age:    70 years    BP:           152/93 mmHg Patient Gender: M           HR:           91 bpm. Exam Location:  [facility] Procedure: 2D Echo, Cardiac Doppler and Color Doppler Indications:    Chest Pain  History:        Patient has prior history of Echocardiogram examinations, most                 recent 12/10/2020. Previous Myocardial Infarction and CAD, TIA                 and COPD, Arrythmias:Atrial Fibrillation,                 Signs/Symptoms:Dyspnea; Risk Factors:Current Smoker.  Sonographer:    Wenda Low Referring Phys: [id number] ASIA B [place]  1. Left ventricular ejection fraction, by estimation, is 65 to 70%. The left ventricle has normal function. The left ventricle has no regional wall motion abnormalities. There is mild left ventricular hypertrophy. Left ventricular diastolic parameters are indeterminate.  2. Right ventricular systolic function was not well visualized. The right ventricular size is not well visualized. Tricuspid regurgitation signal is inadequate for assessing PA pressure.  3. A small pericardial effusion is  present. The pericardial effusion is circumferential.  4. The mitral valve is normal in structure. No evidence of mitral valve regurgitation. No evidence of mitral stenosis.  5. The aortic valve was not well visualized. There is mild calcification of the aortic valve. There is mild thickening of the aortic valve. Aortic valve regurgitation is not visualized. No aortic stenosis is present.  6. The inferior vena cava is normal in size with greater than 50% respiratory variability, suggesting right atrial pressure of 3 mmHg. FINDINGS  Left Ventricle: Left ventricular ejection fraction, by estimation, is 65 to 70%. The left ventricle has normal  function. The left ventricle has no regional wall motion abnormalities. The left ventricular internal cavity size was normal in size. There is  mild left ventricular hypertrophy. Left ventricular diastolic parameters are indeterminate. Right Ventricle: RV not well visualized, grossly appears normal in size and function. The right ventricular size is not well visualized. Right vetricular wall thickness was not well visualized. Right ventricular systolic function was not well visualized.  Tricuspid regurgitation signal is inadequate for assessing PA pressure. Left Atrium: Left atrial size was normal in size. Right Atrium: Right atrial size was normal in size. Pericardium: A small pericardial effusion is present. The pericardial effusion is circumferential. Mitral Valve: The mitral valve is normal in structure. No evidence of mitral valve regurgitation. No evidence of mitral valve stenosis. MV peak gradient, 3.8 mmHg. The mean mitral valve gradient is 2.0 mmHg. Tricuspid Valve: The tricuspid valve is not well visualized. Tricuspid valve regurgitation is not demonstrated. No evidence of tricuspid stenosis. Aortic Valve: The aortic valve was not well visualized. There is mild calcification of the aortic valve. There is mild thickening of the aortic valve. There is mild aortic valve  annular calcification. Aortic valve regurgitation is not visualized. No aortic stenosis is present. Aortic valve mean gradient measures 2.0 mmHg. Aortic valve peak gradient measures 4.2 mmHg. Aortic valve area, by VTI measures 2.97 cm. Pulmonic Valve: The pulmonic valve was not well visualized. Pulmonic valve regurgitation is not visualized. No evidence of pulmonic stenosis. Aorta: The aortic root is normal in size and structure. Venous: The inferior vena cava is normal in size with greater than 50% respiratory variability, suggesting right atrial pressure of 3 mmHg. IAS/Shunts: No atrial level shunt detected by color flow Doppler.  LEFT VENTRICLE PLAX 2D LVIDd:         3.85 cm LVIDs:         2.29 cm LV PW:         1.17 cm LV IVS:        1.28 cm LVOT diam:     2.10 cm LV SV:         63 LV SV Index:   38 LVOT Area:     3.46 cm  LEFT ATRIUM             Index LA diam:        3.60 cm 2.16 cm/m LA Vol (A2C):   32.3 ml 19.36 ml/m LA Vol (A4C):   34.3 ml 20.56 ml/m LA Biplane Vol: 35.3 ml 21.15 ml/m  AORTIC VALVE AV Area (Vmax):    3.04 cm AV Area (Vmean):   3.26 cm AV Area (VTI):     2.97 cm AV Vmax:           103.00 cm/s AV Vmean:          61.200 cm/s AV VTI:            0.212 m AV Peak Grad:      4.2 mmHg AV Mean Grad:      2.0 mmHg LVOT Vmax:         90.30 cm/s LVOT Vmean:        57.600 cm/s LVOT VTI:          0.182 m LVOT/AV VTI ratio: 0.86  AORTA Ao Root diam: 3.30 cm MITRAL VALVE MV Area (PHT): 3.85 cm    SHUNTS MV Area VTI:   2.33 cm    Systemic VTI:  0.18 m MV Peak grad:  3.8 mmHg  Systemic Diam: 2.10 cm MV Mean grad:  2.0 mmHg MV Vmax:       0.97 m/s MV Vmean:      57.5 cm/s MV Decel Time: 197 msec MV E velocity: 64.80 cm/s MV A velocity: 68.40 cm/s MV E/A ratio:  0.95 Carlyle Dolly MD Electronically signed by Carlyle Dolly MD Signature Date/Time: 03/12/2021/12:28:24 PM    Final      Labs:   Basic Metabolic Panel: Recent Labs  Lab 03/11/21 2050 03/12/21 0546 03/13/21 0341 03/14/21 0621   NA 138 136 136 137  K 3.3* 3.2* 3.2* 3.7  CL 97* 97* 97* 98  CO2 '31 29 29 28  '$ GLUCOSE 127* 148* 89 75  BUN '17 15 15 16  '$ CREATININE 0.85 0.73 0.78 0.79  CALCIUM 9.2 8.8* 8.9 9.0  MG  --  1.6* 2.1 2.1  PHOS  --   --  2.4* 3.5   GFR Estimated Creatinine Clearance: 66.4 mL/min (by C-G formula based on SCr of 0.79 mg/dL). Liver Function Tests: Recent Labs  Lab 03/11/21 2050 03/12/21 0546 03/14/21 0621  AST 61* 55* 56*  ALT 63* 58* 64*  ALKPHOS 101 93 89  BILITOT 1.1 1.2 1.6*  PROT 7.5 6.7 6.4*  ALBUMIN 4.3 3.8 3.7   No results for input(s): LIPASE, AMYLASE in the last 168 hours. Recent Labs  Lab 03/11/21 2050  AMMONIA 14   Coagulation profile No results for input(s): INR, PROTIME in the last 168 hours.  CBC: Recent Labs  Lab 03/11/21 2050 03/12/21 0546 03/13/21 0341 03/14/21 0621  WBC 8.1 7.3 8.8 6.7  NEUTROABS 5.8  --   --   --   HGB 16.4 15.1 15.0 15.5  HCT 51.7 47.6 48.2 50.2  MCV 83.8 83.5 83.0 83.3  PLT 318 281 347 390   Cardiac Enzymes: No results for input(s): CKTOTAL, CKMB, CKMBINDEX, TROPONINI in the last 168 hours. BNP: Invalid input(s): POCBNP CBG: Recent Labs  Lab 03/11/21 2331  GLUCAP 105*   D-Dimer No results for input(s): DDIMER in the last 72 hours. Hgb A1c No results for input(s): HGBA1C in the last 72 hours. Lipid Profile No results for input(s): CHOL, HDL, LDLCALC, TRIG, CHOLHDL, LDLDIRECT in the last 72 hours. Thyroid function studies No results for input(s): TSH, T4TOTAL, T3FREE, THYROIDAB in the last 72 hours.  Invalid input(s): FREET3 Anemia work up No results for input(s): VITAMINB12, FOLATE, FERRITIN, TIBC, IRON, RETICCTPCT in the last 72 hours. Microbiology Recent Results (from the past 240 hour(s))  Urine Culture     Status: Abnormal   Collection Time: 03/12/21 12:01 AM   Specimen: Urine, Clean Catch  Result Value Ref Range Status   Specimen Description   Final    URINE, CLEAN CATCH Performed at [facility], [address] [place], [place] [address]    Special Requests   Final    NONE Performed at [facility], [address], [place], [place] [address]    Culture (A)  Final    <10,000 COLONIES/mL INSIGNIFICANT GROWTH Performed at [facility] [address] [place], [place] [address]    Report Status 03/13/2021 FINAL  Final  Resp Panel by RT-PCR (Flu A&B, Covid) Nasopharyngeal Swab     Status: None   Collection Time: 03/12/21 12:01 AM   Specimen: Nasopharyngeal Swab; Nasopharyngeal(NP) swabs in vial transport medium  Result Value Ref Range Status   SARS Coronavirus 2 by RT PCR NEGATIVE NEGATIVE Final    Comment: (NOTE) SARS-CoV-2 target nucleic acids  are NOT DETECTED.  The SARS-CoV-2 RNA is generally detectable in upper respiratory specimens during the acute phase of infection. The lowest concentration of SARS-CoV-2 viral copies this assay can detect is 138 copies/mL. A negative result does not preclude SARS-Cov-2 infection and should not be used as the sole basis for treatment or other patient management decisions. A negative result may occur with  improper specimen collection/handling, submission of specimen other than nasopharyngeal swab, presence of viral mutation(s) within the areas targeted by this assay, and inadequate number of viral copies(<138 copies/mL). A negative result must be combined with clinical observations, patient history, and epidemiological information. The expected result is Negative.  Fact Sheet for Patients:  EntrepreneurPulse.com.au  Fact Sheet for Healthcare Providers:  IncredibleEmployment.be  This test is no t yet approved or cleared by the [place] FDA and  has been authorized for detection and/or diagnosis of SARS-CoV-2 by FDA under an Emergency Use Authorization (EUA). This EUA will remain  in effect (meaning this test can be used) for the duration of the COVID-19 declaration under Section  564(b)(1) of the Act, 21 U.S.C.section 360bbb-3(b)(1), unless the authorization is terminated  or revoked sooner.       Influenza A by PCR NEGATIVE NEGATIVE Final   Influenza B by PCR NEGATIVE NEGATIVE Final    Comment: (NOTE) The Xpert Xpress SARS-CoV-2/FLU/RSV plus assay is intended as an aid in the diagnosis of influenza from Nasopharyngeal swab specimens and should not be used as a sole basis for treatment. Nasal washings and aspirates are unacceptable for Xpert Xpress SARS-CoV-2/FLU/RSV testing.  Fact Sheet for Patients: EntrepreneurPulse.com.au  Fact Sheet for Healthcare Providers: IncredibleEmployment.be  This test is not yet approved or cleared by the [place] FDA and has been authorized for detection and/or diagnosis of SARS-CoV-2 by FDA under an Emergency Use Authorization (EUA). This EUA will remain in effect (meaning this test can be used) for the duration of the COVID-19 declaration under Section 564(b)(1) of the Act, 21 U.S.C. section 360bbb-3(b)(1), unless the authorization is terminated or revoked.  Performed at [facility], [address], [place], [place] [address]      Discharge Instructions:   Discharge Instructions     Diet - low sodium heart healthy   Complete by: As directed    Discharge instructions   Complete by: As directed    Follow-up with your primary care physician in 1 week.  Continue to take medications including blood thinner without interruption.  Do not overexert.  No alcohol or smoking.  Continue to use oxygen at home.   Increase activity slowly   Complete by: As directed       Allergies as of 03/16/2021   No Known Allergies      Medication List     TAKE these medications    albuterol (2.5 MG/3ML) 0.083% nebulizer solution Commonly known as: PROVENTIL Take 3 mLs (2.5 mg total) by nebulization every 4 (four) hours as needed for wheezing or shortness of breath. What changed: Another  medication with the same name was changed. Make sure you understand how and when to take each.   albuterol 108 (90 Base) MCG/ACT inhaler Commonly known as: VENTOLIN HFA INHALE 2 PUFFS INTO THE LUNGS EVERY 4 HOURS AS NEEDED FOR WHEEZING AND SHORTNESS OF BREATH What changed: See the new instructions.   apixaban 5 MG Tabs tablet Commonly known as: ELIQUIS Take 1 tablet (5 mg total) by mouth 2 (two) times daily.   aspirin 81 MG tablet Take 81 mg  by mouth at bedtime.   atorvastatin 40 MG tablet Commonly known as: LIPITOR Take 1 tablet (40 mg total) by mouth at bedtime.   Breztri Aerosphere 160-9-4.8 MCG/ACT Aero Generic drug: Budeson-Glycopyrrol-Formoterol Inhale 2 puffs into the lungs 2 (two) times daily.   D3 PO Take 1 tablet by mouth daily.   fluticasone 50 MCG/ACT nasal spray Commonly known as: FLONASE USE 2 SPRAYS in EACH NOSTRIL EVERY DAY   folic acid 1 MG tablet Commonly known as: FOLVITE TAKE 1 TABLET BY MOUTH EVERY DAY Please make appointment WITH new pcp What changed: See the new instructions.   LORazepam 0.5 MG tablet Commonly known as: ATIVAN Take 1 tablet (0.5 mg total) by mouth 2 (two) times daily as needed for anxiety.   magnesium oxide 400 (240 Mg) MG tablet Commonly known as: MAG-OX Take 1 tablet (400 mg total) by mouth daily.   metoprolol tartrate 25 MG tablet Commonly known as: LOPRESSOR Take 1 tablet (25 mg total) by mouth 2 (two) times daily.   multivitamin with minerals Tabs tablet Take 1 tablet by mouth daily.   nitroGLYCERIN 0.4 MG SL tablet Commonly known as: Nitrostat dissolve 1 TABLET UNDER THE TONGUE every 5 minutes AS NEEDED CHEST PAIN What changed:  how much to take how to take this when to take this reasons to take this additional instructions   omeprazole 40 MG capsule Commonly known as: PRILOSEC Take 1 capsule (40 mg total) by mouth daily.   QUEtiapine 25 MG tablet Commonly known as: SEROQUEL Take 1 tablet (25 mg total) by  mouth at bedtime.   thiamine 100 MG tablet Take 5 tablets (500 mg total) by mouth daily.   traMADol 50 MG tablet Commonly known as: ULTRAM TAKE 1 TABLET BY MOUTH EVERY 6 HOURS AS NEEDED FOR moderate PAIN   VITAMIN B-12 PO Take 1 tablet by mouth daily.   zinc gluconate 50 MG tablet Take 50 mg by mouth daily.        Follow-up Information     [name], MD. Schedule an appointment as soon as possible for a visit in 1 week(s).   Specialty: Family Medicine Contact information: [address] [place] [address] [phone]         Josue Hector, MD .   Specialty: Cardiology Contact information: [phone] [place] [address] [phone]                  Time coordinating discharge: 39 minutes  Signed:  Derrel Moore  [facility] 03/17/2021, 2:13 PM

## 2021-03-18 ENCOUNTER — Telehealth: Payer: Self-pay

## 2021-03-18 MED ORDER — APIXABAN 5 MG PO TABS: 5.0000 mg | ORAL_TABLET | Freq: Two times a day (BID) | ORAL | 3 refills | Status: DC

## 2021-03-18 NOTE — Telephone Encounter (Signed)
Pt called in stating that [facility] has faxed a new prescription order for the med apixaban (ELIQUIS) 5 MG TABS to be decreased to 30 tablets so that the pt is able to afford this medicine until pt can get another med to replace it. Please advise.  Cb#: [phone]

## 2021-03-18 NOTE — Telephone Encounter (Signed)
Call placed to patient to inquire.   Reports that patient will need to have meds done every 30 days.   Prescription sent to pharmacy.

## 2021-03-19 ENCOUNTER — Other Ambulatory Visit: Payer: Self-pay | Admitting: *Deleted

## 2021-03-19 MED ORDER — OMEPRAZOLE 40 MG PO CPDR: 40.0000 mg | DELAYED_RELEASE_CAPSULE | Freq: Every day | ORAL | 3 refills | Status: DC

## 2021-03-19 MED ORDER — FOLIC ACID 1 MG PO TABS: ORAL_TABLET | ORAL | 3 refills | Status: DC

## 2021-03-20 ENCOUNTER — Ambulatory Visit: Payer: Medicare HMO | Admitting: Neurology

## 2021-03-20 NOTE — Progress Notes (Deleted)
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 Zachary Chambers is a 70 year old right-handed male with COPD, CAD s/p MI and HLD who follows up for stroke.  History supplemented by hospital notes.  He is accompanied by his significant other who also provides collateral history.    UPDATE: Current medications:  ***  CTA of head on 10/04/2020 personally reviewed showed no intracranial large or medium vessel occlusion or significant stenosis.  CTA of neck was not performed.    He had not been taking Eliquis due to financial constraints.    He was admitted to the hospital on 7/26 for chest pain and hallucinations, acting confused and seeing and conversing with people who were not there.  He had also fallen and was wandering.  CT head personally reviewed was negative for acute abnormalities but follow-up MRI of brain personally reviewed showed few small subacute infarcts in the left frontoparietal lobes.  Mild hypokalemia and AST/ALT but alcohol level not detectable.  Ammonia was 14.  No significant CO2 retention and no evidence of infection.  2D echo showed LVEF 65-70%.  No a fib noted.  Restarted on Eliquis and ASA.  HISTORY:  H was admitted to [facility] in November for acute respiratory failure secondary to COVID pneumonia.  D-dimer was 4.02 which trended down to 0.79 by discharge.  He had transient atrial fibrillation noted on telemetry believed to be secondary to acute illness.  Since hospitalization, he has had right sided weakness.  He reports difficulty lifting his right arm and leg.  He followed up with his PCP.  MRI of brain was performed on 08/29/2020, which showed a 1.5 cm acute to early subacute infarct affecting the cortical and subcortical left vertex  frontoparietal junction.  Moderate to severe chronic small vessel ischemic changes also noted.  Given that he had a fib in the hospital, his PCP started him on Eliquis.  He takes atorvastatin '40mg'$  daily for hyperlipidemia.  LDL from July 2021 was 61.  Hgb A1c at that time was 5.4.  2D echocardiogram from July 2021 showed EF 55-60% with no cardiac source of emboli.  Since discharge from the hospital, he reports recurrent bowel incontinence.   PAST MEDICAL HISTORY: Past Medical History:  Diagnosis Date   Alcohol abuse    Aortic atherosclerosis (HCC)    CAD in native artery    Chronic respiratory failure (HCC)    COPD (chronic obstructive pulmonary disease) (HCC)    DJD (degenerative joint disease)    diffusely   GERD (gastroesophageal reflux disease)    Hyperlipidemia    On home O2    Paroxysmal atrial fibrillation (HCC)    Pericardial effusion    Pneumonia due to COVID-19 virus    PUD (peptic ulcer disease)    with bleeding   Rotator cuff disorder    has been evaluated by Dr Clifton James and [facility]   Stroke (cerebrum) [facility])     MEDICATIONS: Current Outpatient Medications on File Prior to Visit  Medication Sig Dispense Refill   albuterol (PROVENTIL) (2.5 MG/3ML) 0.083% nebulizer solution Take 3 mLs (2.5 mg total) by nebulization every 4 (four) hours as needed for wheezing or shortness of breath. 180 mL 4   albuterol (VENTOLIN HFA) 108 (90 Base) MCG/ACT inhaler INHALE 2 PUFFS INTO THE LUNGS EVERY  4 HOURS AS NEEDED FOR WHEEZING AND SHORTNESS OF BREATH 18 g 0   apixaban (ELIQUIS) 5 MG TABS tablet Take 1 tablet (5 mg total) by mouth 2 (two) times daily. 60 tablet 3   aspirin 81 MG tablet Take 81 mg by mouth at bedtime.     atorvastatin (LIPITOR) 40 MG tablet Take 1 tablet (40 mg total) by mouth at bedtime. 90 tablet 3   Budeson-Glycopyrrol-Formoterol (BREZTRI AEROSPHERE) 160-9-4.8 MCG/ACT AERO Inhale 2 puffs into the lungs 2 (two) times daily. 32.1 g 3   Cholecalciferol (D3 PO) Take 1  tablet by mouth daily.     Cyanocobalamin (VITAMIN B-12 PO) Take 1 tablet by mouth daily.     fluticasone (FLONASE) 50 MCG/ACT nasal spray USE 2 SPRAYS in EACH NOSTRIL EVERY DAY 16 g 6   folic acid (FOLVITE) 1 MG tablet TAKE 1 TABLET BY MOUTH EVERY DAY Please make appointment WITH new pcp 90 tablet 3   LORazepam (ATIVAN) 0.5 MG tablet Take 1 tablet (0.5 mg total) by mouth 2 (two) times daily as needed for anxiety. 180 tablet 0   magnesium oxide (MAG-OX) 400 (240 Mg) MG tablet Take 1 tablet (400 mg total) by mouth daily. 100 tablet 0   metoprolol tartrate (LOPRESSOR) 25 MG tablet Take 1 tablet (25 mg total) by mouth 2 (two) times daily. 60 tablet 3   Multiple Vitamin (MULTIVITAMIN WITH MINERALS) TABS tablet Take 1 tablet by mouth daily. 90 tablet 0   nitroGLYCERIN (NITROSTAT) 0.4 MG SL tablet dissolve 1 TABLET UNDER THE TONGUE every 5 minutes AS NEEDED CHEST PAIN 25 tablet 0   omeprazole (PRILOSEC) 40 MG capsule Take 1 capsule (40 mg total) by mouth daily. 90 capsule 3   QUEtiapine (SEROQUEL) 25 MG tablet Take 1 tablet (25 mg total) by mouth at bedtime. 30 tablet 0   thiamine 100 MG tablet Take 5 tablets (500 mg total) by mouth daily. 100 tablet 0   traMADol (ULTRAM) 50 MG tablet TAKE 1 TABLET BY MOUTH EVERY 6 HOURS AS NEEDED FOR moderate PAIN 30 tablet 0   zinc gluconate 50 MG tablet Take 50 mg by mouth daily.     No current facility-administered medications on file prior to visit.    ALLERGIES: No Known Allergies  FAMILY HISTORY: Family History  Problem Relation Age of Onset   Ovarian cancer Mother    Diabetes Maternal Grandfather    COPD Father    Diabetes Paternal Grandfather    Colon cancer Paternal Grandfather    Esophageal cancer Neg Hx    Rectal cancer Neg Hx    Stomach cancer Neg Hx       Objective:  *** General: No acute distress.  Patient appears ***-groomed.   Head:  Normocephalic/atraumatic Eyes:  Fundi examined but not visualized Neck: supple, no paraspinal  tenderness, full range of motion Heart:  Regular rate and rhythm Lungs:  Clear to auscultation bilaterally Back: No paraspinal tenderness Neurological Exam: alert and oriented to person, place, and time.  Speech fluent and not dysarthric, language intact.  CN II-XII intact. Bulk and tone normal, muscle strength 5/5 throughout.  Sensation to light touch intact.  Deep tendon reflexes 2+ throughout, toes downgoing.  Finger to nose testing intact.  Gait normal, Romberg negative.   Metta Clines, DO  CC: ***

## 2021-03-21 ENCOUNTER — Other Ambulatory Visit: Payer: Self-pay | Admitting: *Deleted

## 2021-03-21 MED ORDER — NITROGLYCERIN 0.4 MG SL SUBL: SUBLINGUAL_TABLET | SUBLINGUAL | 0 refills | Status: DC

## 2021-03-24 ENCOUNTER — Telehealth: Payer: Medicare HMO

## 2021-03-24 ENCOUNTER — Telehealth: Payer: Self-pay | Admitting: *Deleted

## 2021-03-24 NOTE — Telephone Encounter (Signed)
  Care Management   Follow Up Note   03/24/2021 Name: CLENARD SHIMOMURA MRN: [id number] DOB: 1951/01/03   Referred by: [name], MD Reason for referral : Chronic Care Management (COPD, CAD)   A second unsuccessful telephone outreach was attempted today. The patient was referred to the case management team for assistance with care management and care coordination.   Follow Up Plan: Telephone follow up appointment with care management team member scheduled for:   upon rescheduling by care guide.  Jacqlyn Larsen RNC, BSN RN Case Manager [facility] [phone]

## 2021-03-31 ENCOUNTER — Inpatient Hospital Stay: Payer: Medicare HMO | Admitting: Family Medicine

## 2021-04-07 ENCOUNTER — Telehealth: Payer: Self-pay

## 2021-04-07 NOTE — Chronic Care Management (AMB) (Signed)
  Care Management   Note  04/07/2021 Name: Zachary Chambers MRN: [id number] DOB: 08-28-1950  Zachary Chambers is a 70 y.o. year old male who is a primary care patient of Dennard Schaumann, Cammie Mcgee, MD and is actively engaged with the care management team. I reached out to Janeann Merl by phone today to assist with re-scheduling a follow up visit with the RN Case Manager  Follow up plan: Unsuccessful telephone outreach attempt made.  The care management team will reach out to the patient again over the next 7 days.  If patient returns call to provider office, please advise to call [facility]  at [place], [place], [place], [place] [address] Direct Dial: [phone] [email or web address]: [email or web address]

## 2021-04-11 DIAGNOSIS — R0600 Dyspnea, unspecified: Secondary | ICD-10-CM | POA: Diagnosis not present

## 2021-04-11 DIAGNOSIS — J1282 Pneumonia due to coronavirus disease 2019: Secondary | ICD-10-CM | POA: Diagnosis not present

## 2021-04-11 DIAGNOSIS — J449 Chronic obstructive pulmonary disease, unspecified: Secondary | ICD-10-CM | POA: Diagnosis not present

## 2021-04-11 DIAGNOSIS — U071 COVID-19: Secondary | ICD-10-CM | POA: Diagnosis not present

## 2021-04-11 DIAGNOSIS — J9621 Acute and chronic respiratory failure with hypoxia: Secondary | ICD-10-CM | POA: Diagnosis not present

## 2021-04-11 DIAGNOSIS — I4891 Unspecified atrial fibrillation: Secondary | ICD-10-CM | POA: Diagnosis not present

## 2021-04-14 DIAGNOSIS — J449 Chronic obstructive pulmonary disease, unspecified: Secondary | ICD-10-CM | POA: Diagnosis not present

## 2021-04-15 DIAGNOSIS — M6281 Muscle weakness (generalized): Secondary | ICD-10-CM | POA: Diagnosis not present

## 2021-04-15 DIAGNOSIS — J449 Chronic obstructive pulmonary disease, unspecified: Secondary | ICD-10-CM | POA: Diagnosis not present

## 2021-04-16 ENCOUNTER — Telehealth: Payer: Self-pay

## 2021-04-16 ENCOUNTER — Telehealth: Payer: Self-pay | Admitting: *Deleted

## 2021-04-16 ENCOUNTER — Telehealth: Payer: Self-pay | Admitting: Pharmacist

## 2021-04-16 MED ORDER — BREZTRI AEROSPHERE 160-9-4.8 MCG/ACT IN AERO: 2.0000 | INHALATION_SPRAY | Freq: Two times a day (BID) | RESPIRATORY_TRACT | 3 refills | Status: DC

## 2021-04-16 NOTE — Progress Notes (Addendum)
    Chronic Care Management Pharmacy Assistant   Name: RACHEL MUNETON  MRN: [id number] DOB: [birth year]  Reason for Encounter: PAP  Medications: Outpatient Encounter Medications as of 04/16/2021  Medication Sig Note   albuterol (PROVENTIL) (2.5 MG/3ML) 0.083% nebulizer solution Take 3 mLs (2.5 mg total) by nebulization every 4 (four) hours as needed for wheezing or shortness of breath.    albuterol (VENTOLIN HFA) 108 (90 Base) MCG/ACT inhaler INHALE 2 PUFFS INTO THE LUNGS EVERY 4 HOURS AS NEEDED FOR WHEEZING AND SHORTNESS OF BREATH    apixaban (ELIQUIS) 5 MG TABS tablet Take 1 tablet (5 mg total) by mouth 2 (two) times daily.    aspirin 81 MG tablet Take 81 mg by mouth at bedtime.    atorvastatin (LIPITOR) 40 MG tablet Take 1 tablet (40 mg total) by mouth at bedtime.    Budeson-Glycopyrrol-Formoterol (BREZTRI AEROSPHERE) 160-9-4.8 MCG/ACT AERO Inhale 2 puffs into the lungs 2 (two) times daily.    Cholecalciferol (D3 PO) Take 1 tablet by mouth daily.    Cyanocobalamin (VITAMIN B-12 PO) Take 1 tablet by mouth daily. 03/12/2021: Pt ran out   fluticasone (FLONASE) 50 MCG/ACT nasal spray USE 2 SPRAYS in EACH NOSTRIL EVERY DAY    folic acid (FOLVITE) 1 MG tablet TAKE 1 TABLET BY MOUTH EVERY DAY Please make appointment WITH new pcp    LORazepam (ATIVAN) 0.5 MG tablet Take 1 tablet (0.5 mg total) by mouth 2 (two) times daily as needed for anxiety.    magnesium oxide (MAG-OX) 400 (240 Mg) MG tablet Take 1 tablet (400 mg total) by mouth daily.    metoprolol tartrate (LOPRESSOR) 25 MG tablet Take 1 tablet (25 mg total) by mouth 2 (two) times daily.    Multiple Vitamin (MULTIVITAMIN WITH MINERALS) TABS tablet Take 1 tablet by mouth daily. 03/12/2021: Centrum for men   nitroGLYCERIN (NITROSTAT) 0.4 MG SL tablet dissolve 1 TABLET UNDER THE TONGUE every 5 minutes AS NEEDED CHEST PAIN    omeprazole (PRILOSEC) 40 MG capsule Take 1 capsule (40 mg total) by mouth daily.    QUEtiapine (SEROQUEL) 25 MG tablet Take 1  tablet (25 mg total) by mouth at bedtime.    thiamine 100 MG tablet Take 5 tablets (500 mg total) by mouth daily.    traMADol (ULTRAM) 50 MG tablet TAKE 1 TABLET BY MOUTH EVERY 6 HOURS AS NEEDED FOR moderate PAIN    zinc gluconate 50 MG tablet Take 50 mg by mouth daily.    No facility-administered encounter medications on file as of 04/16/2021.    New patient assistance application form filled out to [place] [facility] [place] for Eliquis and [employer].. Waiting for patient and provider to complete and sign documentation. Called patient to inquire if they wanted the application mailed to them or if they wanted to come into the office. Patient is required to sign application and to bring/have proof of income. His wife stated she would be willing to come into office to bring proof of income and sign application once the paperwork came in the mail. The patients wife would like it mailed to their residence address [place] [address].  Follow-Up:Pharmacist Review  Charlann Lange, [facility] Pharmacist Assistant [phone]

## 2021-04-16 NOTE — Telephone Encounter (Signed)
Pt's sgo called in stating that pt needs a new order for a nebulizer with the face mask kit sent to [facility]. Sgo states that the nebulizer that pt has currently is not working properly. Pt sgo stated if the order could be put in today, she would be able to go and pick it up. Please advise.  Cb#: [phone]

## 2021-04-16 NOTE — Telephone Encounter (Signed)
-----   Message from Rosetta Posner sent at 04/15/2021  2:40 PM EDT ----- Regarding: Medication Refill Hi can you send refills for the patients inhalers to [place], [place] - [address]  The patients wife stated he is completely out and needs them today.

## 2021-04-16 NOTE — Telephone Encounter (Signed)
Prescription sent to pharmacy for [facility].

## 2021-04-16 NOTE — Telephone Encounter (Signed)
Call placed to patient.   Reports that he requires new machine and face mask to be sent to [employer]   Orders placed.

## 2021-04-17 ENCOUNTER — Other Ambulatory Visit: Payer: Self-pay | Admitting: *Deleted

## 2021-04-17 ENCOUNTER — Telehealth: Payer: Self-pay | Admitting: *Deleted

## 2021-04-17 DIAGNOSIS — J449 Chronic obstructive pulmonary disease, unspecified: Secondary | ICD-10-CM | POA: Diagnosis not present

## 2021-04-17 MED ORDER — NITROGLYCERIN 0.4 MG SL SUBL: SUBLINGUAL_TABLET | SUBLINGUAL | 0 refills | Status: DC

## 2021-04-17 MED ORDER — METOPROLOL TARTRATE 25 MG PO TABS: 25.0000 mg | ORAL_TABLET | Freq: Two times a day (BID) | ORAL | 3 refills | Status: DC

## 2021-04-17 MED ORDER — BREZTRI AEROSPHERE 160-9-4.8 MCG/ACT IN AERO: 2.0000 | INHALATION_SPRAY | Freq: Two times a day (BID) | RESPIRATORY_TRACT | 3 refills | Status: DC

## 2021-04-17 NOTE — Telephone Encounter (Signed)
Received request from pharmacy for [place] on [address].  PA submitted.   Dx: J44.9- COPD  Your information has been submitted to [facility]. [employer] will review the request and will issue a decision, typically within 3-7 days from your submission. You can check the updated outcome later by reopening this request.  If [employer] has not responded in 3-7 days or if you have any questions about your ePA request, please contact [employer] at [phone].

## 2021-04-17 NOTE — Addendum Note (Signed)
Addended by: Sheral Flow on: 04/17/2021 08:13 PM   Modules accepted: Orders

## 2021-04-17 NOTE — Telephone Encounter (Signed)
Received PA determination.   Was advised that quantity requested >90 days. Advised to decrease requested quantity.   Prescription sent to pharmacy.

## 2021-04-19 ENCOUNTER — Other Ambulatory Visit: Payer: Self-pay

## 2021-04-19 ENCOUNTER — Inpatient Hospital Stay (HOSPITAL_COMMUNITY)
Admission: EM | Admit: 2021-04-19 | Discharge: 2021-04-21 | DRG: 311 | Disposition: A | Discharge status: Home / Self Care | Payer: Medicare HMO | Attending: Internal Medicine | Admitting: Internal Medicine

## 2021-04-19 ENCOUNTER — Emergency Department (HOSPITAL_COMMUNITY): Payer: Medicare HMO

## 2021-04-19 ENCOUNTER — Encounter (HOSPITAL_COMMUNITY): Payer: Self-pay | Admitting: Internal Medicine

## 2021-04-19 DIAGNOSIS — E785 Hyperlipidemia, unspecified: Secondary | ICD-10-CM

## 2021-04-19 DIAGNOSIS — I480 Paroxysmal atrial fibrillation: Secondary | ICD-10-CM

## 2021-04-19 DIAGNOSIS — F419 Anxiety disorder, unspecified: Secondary | ICD-10-CM | POA: Diagnosis present

## 2021-04-19 DIAGNOSIS — I959 Hypotension, unspecified: Secondary | ICD-10-CM | POA: Diagnosis not present

## 2021-04-19 DIAGNOSIS — Z79899 Other long term (current) drug therapy: Secondary | ICD-10-CM

## 2021-04-19 DIAGNOSIS — G894 Chronic pain syndrome: Secondary | ICD-10-CM | POA: Diagnosis present

## 2021-04-19 DIAGNOSIS — Z9981 Dependence on supplemental oxygen: Secondary | ICD-10-CM | POA: Diagnosis not present

## 2021-04-19 DIAGNOSIS — J9611 Chronic respiratory failure with hypoxia: Secondary | ICD-10-CM | POA: Diagnosis not present

## 2021-04-19 DIAGNOSIS — F418 Other specified anxiety disorders: Secondary | ICD-10-CM | POA: Diagnosis not present

## 2021-04-19 DIAGNOSIS — I700 Atherosclerosis of aorta: Secondary | ICD-10-CM | POA: Diagnosis present

## 2021-04-19 DIAGNOSIS — Z20822 Contact with and (suspected) exposure to covid-19: Secondary | ICD-10-CM | POA: Diagnosis not present

## 2021-04-19 DIAGNOSIS — I249 Acute ischemic heart disease, unspecified: Principal | ICD-10-CM | POA: Diagnosis present

## 2021-04-19 DIAGNOSIS — R000 Tachycardia, unspecified: Secondary | ICD-10-CM | POA: Diagnosis not present

## 2021-04-19 DIAGNOSIS — Z7982 Long term (current) use of aspirin: Secondary | ICD-10-CM

## 2021-04-19 DIAGNOSIS — R079 Chest pain, unspecified: Secondary | ICD-10-CM | POA: Diagnosis present

## 2021-04-19 DIAGNOSIS — R0789 Other chest pain: Secondary | ICD-10-CM | POA: Diagnosis not present

## 2021-04-19 DIAGNOSIS — I10 Essential (primary) hypertension: Secondary | ICD-10-CM | POA: Diagnosis not present

## 2021-04-19 DIAGNOSIS — Z7901 Long term (current) use of anticoagulants: Secondary | ICD-10-CM

## 2021-04-19 DIAGNOSIS — I25110 Atherosclerotic heart disease of native coronary artery with unstable angina pectoris: Secondary | ICD-10-CM | POA: Diagnosis present

## 2021-04-19 DIAGNOSIS — Z8673 Personal history of transient ischemic attack (TIA), and cerebral infarction without residual deficits: Secondary | ICD-10-CM

## 2021-04-19 DIAGNOSIS — Z8616 Personal history of COVID-19: Secondary | ICD-10-CM

## 2021-04-19 DIAGNOSIS — I252 Old myocardial infarction: Secondary | ICD-10-CM

## 2021-04-19 DIAGNOSIS — J449 Chronic obstructive pulmonary disease, unspecified: Secondary | ICD-10-CM

## 2021-04-19 DIAGNOSIS — K219 Gastro-esophageal reflux disease without esophagitis: Secondary | ICD-10-CM | POA: Diagnosis not present

## 2021-04-19 DIAGNOSIS — R0902 Hypoxemia: Secondary | ICD-10-CM | POA: Diagnosis not present

## 2021-04-19 DIAGNOSIS — Z8711 Personal history of peptic ulcer disease: Secondary | ICD-10-CM | POA: Diagnosis not present

## 2021-04-19 DIAGNOSIS — Z87891 Personal history of nicotine dependence: Secondary | ICD-10-CM

## 2021-04-19 HISTORY — DX: Acute ischemic heart disease, unspecified: I24.9

## 2021-04-19 LAB — HEPATIC FUNCTION PANEL
ALT: 16 U/L (ref 0–44)
AST: 27 U/L (ref 15–41)
Albumin: 3.3 g/dL — ABNORMAL LOW (ref 3.5–5.0)
Alkaline Phosphatase: 110 U/L (ref 38–126)
Bilirubin, Direct: 0.1 mg/dL (ref 0.0–0.2)
Indirect Bilirubin: 0.3 mg/dL (ref 0.3–0.9)
Total Bilirubin: 0.4 mg/dL (ref 0.3–1.2)
Total Protein: 6.1 g/dL — ABNORMAL LOW (ref 6.5–8.1)

## 2021-04-19 LAB — BASIC METABOLIC PANEL
Anion gap: 6 (ref 5–15)
BUN: 14 mg/dL (ref 8–23)
CO2: 29 mmol/L (ref 22–32)
Calcium: 8.1 mg/dL — ABNORMAL LOW (ref 8.9–10.3)
Chloride: 99 mmol/L (ref 98–111)
Creatinine, Ser: 0.77 mg/dL (ref 0.61–1.24)
GFR, Estimated: >60 mL/min (ref >60)
Glucose, Bld: 133 mg/dL — ABNORMAL HIGH (ref 70–99)
Potassium: 3.4 mmol/L — ABNORMAL LOW (ref 3.5–5.1)
Sodium: 134 mmol/L — ABNORMAL LOW (ref 135–145)

## 2021-04-19 LAB — CBC
HCT: 44.6 % (ref 39.0–52.0)
Hemoglobin: 14 g/dL (ref 13.0–17.0)
MCH: 26.6 pg (ref 26.0–34.0)
MCHC: 31.4 g/dL (ref 30.0–36.0)
MCV: 84.6 fL (ref 80.0–100.0)
Platelets: 298 10*3/uL (ref 150–400)
RBC: 5.27 MIL/uL (ref 4.22–5.81)
RDW: 17.4 % — ABNORMAL HIGH (ref 11.5–15.5)
WBC: 9.2 10*3/uL (ref 4.0–10.5)
nRBC: 0 % (ref 0.0–0.2)

## 2021-04-19 LAB — RESP PANEL BY RT-PCR (FLU A&B, COVID) ARPGX2
Influenza A by PCR: NEGATIVE
Influenza B by PCR: NEGATIVE
SARS Coronavirus 2 by RT PCR: NEGATIVE

## 2021-04-19 LAB — APTT: aPTT: 42 seconds — ABNORMAL HIGH (ref 24–36)

## 2021-04-19 LAB — HEPARIN LEVEL (UNFRACTIONATED): Heparin Unfractionated: >1.1 IU/mL — ABNORMAL HIGH (ref 0.30–0.70)

## 2021-04-19 LAB — TROPONIN I (HIGH SENSITIVITY)
Troponin I (High Sensitivity): 118 ng/L (ref <18)
Troponin I (High Sensitivity): 162 ng/L (ref <18)

## 2021-04-19 LAB — PHOSPHORUS: Phosphorus: 3.2 mg/dL (ref 2.5–4.6)

## 2021-04-19 LAB — MAGNESIUM
Magnesium: 1.5 mg/dL — ABNORMAL LOW (ref 1.7–2.4)
Magnesium: 2.3 mg/dL (ref 1.7–2.4)

## 2021-04-19 LAB — LIPASE, BLOOD: Lipase: 47 U/L (ref 11–51)

## 2021-04-19 MED ORDER — ACETAMINOPHEN 325 MG PO TABS
650.0000 mg | ORAL_TABLET | ORAL | Status: DC | PRN
Start: 2021-04-19 — End: 2021-04-21

## 2021-04-19 MED ORDER — ACETAMINOPHEN 325 MG PO TABS: 650.0000 mg | ORAL_TABLET | Freq: Four times a day (QID) | ORAL | Status: DC | PRN

## 2021-04-19 MED ORDER — ASPIRIN 81 MG PO CHEW: 324.0000 mg | CHEWABLE_TABLET | ORAL | Status: DC

## 2021-04-19 MED ORDER — ENOXAPARIN SODIUM 40 MG/0.4ML IJ SOSY: 40.0000 mg | PREFILLED_SYRINGE | INTRAMUSCULAR | Status: DC

## 2021-04-19 MED ORDER — HEPARIN BOLUS VIA INFUSION
3000.0000 [IU] | Freq: Once | INTRAVENOUS | Status: AC
  Administered 2021-04-19: 3000 [IU] via INTRAVENOUS

## 2021-04-19 MED ORDER — ADULT MULTIVITAMIN W/MINERALS CH
1.0000 | ORAL_TABLET | Freq: Every day | ORAL | Status: DC
  Administered 2021-04-19 – 2021-04-21 (×3): 1 via ORAL
  Filled 2021-04-19 (×3): qty 1

## 2021-04-19 MED ORDER — FLUTICASONE PROPIONATE 50 MCG/ACT NA SUSP
2.0000 | Freq: Every day | NASAL | Status: DC
  Administered 2021-04-19 – 2021-04-21 (×3): 2 via NASAL
  Filled 2021-04-19 (×2): qty 16

## 2021-04-19 MED ORDER — POTASSIUM CHLORIDE CRYS ER 20 MEQ PO TBCR
40.0000 meq | EXTENDED_RELEASE_TABLET | Freq: Once | ORAL | Status: AC
  Administered 2021-04-19: 40 meq via ORAL
  Filled 2021-04-19: qty 2

## 2021-04-19 MED ORDER — ALBUTEROL SULFATE (2.5 MG/3ML) 0.083% IN NEBU: 2.5000 mg | INHALATION_SOLUTION | RESPIRATORY_TRACT | Status: DC | PRN

## 2021-04-19 MED ORDER — THIAMINE HCL 100 MG PO TABS
100.0000 mg | ORAL_TABLET | Freq: Every day | ORAL | Status: DC
  Administered 2021-04-19 – 2021-04-21 (×3): 100 mg via ORAL
  Filled 2021-04-19 (×3): qty 1

## 2021-04-19 MED ORDER — PANTOPRAZOLE SODIUM 40 MG PO TBEC
40.0000 mg | DELAYED_RELEASE_TABLET | Freq: Every day | ORAL | Status: DC
  Administered 2021-04-19 – 2021-04-21 (×3): 40 mg via ORAL
  Filled 2021-04-19 (×3): qty 1

## 2021-04-19 MED ORDER — NITROGLYCERIN 0.4 MG SL SUBL: 0.4000 mg | SUBLINGUAL_TABLET | SUBLINGUAL | Status: DC | PRN

## 2021-04-19 MED ORDER — ATORVASTATIN CALCIUM 40 MG PO TABS
40.0000 mg | ORAL_TABLET | Freq: Every day | ORAL | Status: DC
  Administered 2021-04-19 – 2021-04-20 (×2): 40 mg via ORAL
  Filled 2021-04-19 (×2): qty 1

## 2021-04-19 MED ORDER — SODIUM CHLORIDE 0.9% FLUSH
3.0000 mL | Freq: Two times a day (BID) | INTRAVENOUS | Status: DC
  Administered 2021-04-20: 3 mL via INTRAVENOUS

## 2021-04-19 MED ORDER — METOPROLOL TARTRATE 25 MG PO TABS
25.0000 mg | ORAL_TABLET | Freq: Two times a day (BID) | ORAL | Status: DC
  Administered 2021-04-19 – 2021-04-21 (×5): 25 mg via ORAL
  Filled 2021-04-19 (×5): qty 1

## 2021-04-19 MED ORDER — TRAMADOL HCL 50 MG PO TABS: 50.0000 mg | ORAL_TABLET | Freq: Four times a day (QID) | ORAL | Status: DC | PRN

## 2021-04-19 MED ORDER — SODIUM CHLORIDE 0.9 % IV SOLN: 250.0000 mL | INTRAVENOUS | Status: DC | PRN

## 2021-04-19 MED ORDER — ONDANSETRON HCL 4 MG/2ML IJ SOLN: 4.0000 mg | Freq: Four times a day (QID) | INTRAMUSCULAR | Status: DC | PRN

## 2021-04-19 MED ORDER — ISOSORBIDE MONONITRATE ER 30 MG PO TB24
15.0000 mg | ORAL_TABLET | Freq: Every day | ORAL | Status: DC
  Administered 2021-04-19 – 2021-04-21 (×3): 15 mg via ORAL
  Filled 2021-04-19 (×3): qty 1

## 2021-04-19 MED ORDER — ASPIRIN EC 81 MG PO TBEC
81.0000 mg | DELAYED_RELEASE_TABLET | Freq: Every day | ORAL | Status: DC
  Administered 2021-04-20 – 2021-04-21 (×2): 81 mg via ORAL
  Filled 2021-04-19 (×2): qty 1

## 2021-04-19 MED ORDER — HEPARIN (PORCINE) 25000 UT/250ML-% IV SOLN
1200.0000 [IU]/h | INTRAVENOUS | Status: DC
  Administered 2021-04-19: 650 [IU]/h via INTRAVENOUS
  Administered 2021-04-20: 1200 [IU]/h via INTRAVENOUS
  Administered 2021-04-21: 1300 [IU]/h via INTRAVENOUS
  Filled 2021-04-19 (×3): qty 250

## 2021-04-19 MED ORDER — MORPHINE SULFATE (PF) 2 MG/ML IV SOLN: 2.0000 mg | INTRAVENOUS | Status: DC | PRN

## 2021-04-19 MED ORDER — ONDANSETRON HCL 4 MG PO TABS: 4.0000 mg | ORAL_TABLET | Freq: Four times a day (QID) | ORAL | Status: DC | PRN

## 2021-04-19 MED ORDER — FOLIC ACID 1 MG PO TABS
1.0000 mg | ORAL_TABLET | Freq: Every day | ORAL | Status: DC
  Administered 2021-04-19 – 2021-04-21 (×3): 1 mg via ORAL
  Filled 2021-04-19 (×3): qty 1

## 2021-04-19 MED ORDER — ZINC SULFATE 220 (50 ZN) MG PO CAPS
220.0000 mg | ORAL_CAPSULE | Freq: Every day | ORAL | Status: DC
  Administered 2021-04-19 – 2021-04-21 (×3): 220 mg via ORAL
  Filled 2021-04-19 (×3): qty 1

## 2021-04-19 MED ORDER — ASPIRIN EC 81 MG PO TBEC: 81.0000 mg | DELAYED_RELEASE_TABLET | Freq: Every day | ORAL | Status: DC

## 2021-04-19 MED ORDER — ASPIRIN 300 MG RE SUPP: 300.0000 mg | RECTAL | Status: DC

## 2021-04-19 MED ORDER — QUETIAPINE FUMARATE 25 MG PO TABS
25.0000 mg | ORAL_TABLET | Freq: Every day | ORAL | Status: DC
  Administered 2021-04-19 – 2021-04-20 (×2): 25 mg via ORAL
  Filled 2021-04-19 (×2): qty 1

## 2021-04-19 MED ORDER — MAGNESIUM OXIDE -MG SUPPLEMENT 400 (240 MG) MG PO TABS
400.0000 mg | ORAL_TABLET | Freq: Every day | ORAL | Status: DC
  Administered 2021-04-19 – 2021-04-21 (×3): 400 mg via ORAL
  Filled 2021-04-19 (×3): qty 1

## 2021-04-19 MED ORDER — LORAZEPAM 0.5 MG PO TABS: 0.5000 mg | ORAL_TABLET | Freq: Three times a day (TID) | ORAL | Status: DC | PRN

## 2021-04-19 MED ORDER — ACETAMINOPHEN 650 MG RE SUPP
650.0000 mg | Freq: Four times a day (QID) | RECTAL | Status: DC | PRN
Start: 2021-04-19 — End: 2021-04-19

## 2021-04-19 MED ORDER — SODIUM CHLORIDE 0.9% FLUSH: 3.0000 mL | INTRAVENOUS | Status: DC | PRN

## 2021-04-19 MED ORDER — ACETAMINOPHEN 325 MG PO TABS: 650.0000 mg | ORAL_TABLET | ORAL | Status: DC | PRN

## 2021-04-19 MED ORDER — MAGNESIUM SULFATE 2 GM/50ML IV SOLN
2.0000 g | Freq: Once | INTRAVENOUS | Status: AC
  Administered 2021-04-19: 2 g via INTRAVENOUS
  Filled 2021-04-19: qty 50

## 2021-04-19 MED ORDER — BUDESONIDE 0.5 MG/2ML IN SUSP
0.5000 mg | Freq: Two times a day (BID) | RESPIRATORY_TRACT | Status: DC
  Administered 2021-04-19 – 2021-04-21 (×4): 0.5 mg via RESPIRATORY_TRACT
  Filled 2021-04-19 (×5): qty 2

## 2021-04-19 NOTE — ED Triage Notes (Signed)
Pt BIB [facility] from home c/o acute onset of chest pain approx 1 hour. EMS report pt in a fib. Pt took 324 ASA and 2 NTG prior to arrival. Denies CP on arrival

## 2021-04-19 NOTE — H&P (Signed)
History and Physical    [name] [id number] DOB: [birth year] DOA: 04/19/2021  PCP: [name], MD   Patient coming from: home   I have personally briefly reviewed patient's old medical records in [place]  Chief Complaint: chest pain and SOB.  HPI: Zachary Chambers is a 70 y.o. male with medical history significant of chronic respiratory failure due to COPD (using 2-3 L nasal cannula supplementation at baseline), gastroesophageal flux disease, hyperlipidemia, history of paroxysmal atrial fibrillation disease chronically on Eliquis) and myocardial infarction; who presented to the hospital secondary to chest pain and shortness of breath.  Patient reports symptoms started while watching TV at nighttime, localized in the middle of his chest and radiated to both of his shoulders.  There was associated shortness of breath and per wife reports diaphoresis while the pain was present.  Patient received aspirin and nitroglycerin with some improvement in his discomfort in the way to the emergency department. Patient denies nausea, vomiting, dysuria, hematuria, melena, hematochezia, fever, focal weakness or any other complaints.  COVID PCR is negative in the ED.  Of note, patient presented with acute coronary syndrome about 1 month ago found not to be eligible for invasive ischemic work-up and was decided to focus on medical management.   ED Course: EKG demonstrated no acute ischemic changes or ST elevation; patient's troponin in the 118-160 range currently and a heart score of 4-5.  Recent echocardiogram on previous admission demonstrated no wall motion normalities.  Currently chest pain-free.  Case discussed with cardiology service who recommended 48 hours of heparin drip initiation of nitrates and continue beta-blocker.  Review of Systems: As per HPI otherwise all other systems reviewed and are negative.   Past Medical History:  Diagnosis Date   Alcohol abuse    Aortic  atherosclerosis (HCC)    CAD in native artery    Chronic respiratory failure (HCC)    COPD (chronic obstructive pulmonary disease) (HCC)    DJD (degenerative joint disease)    diffusely   GERD (gastroesophageal reflux disease)    Hyperlipidemia    On home O2    Paroxysmal atrial fibrillation (HCC)    Pericardial effusion    Pneumonia due to COVID-19 virus    PUD (peptic ulcer disease)    with bleeding   Rotator cuff disorder    has been evaluated by Dr Clifton James and [facility]   Stroke (cerebrum) [facility])     Past Surgical History:  Procedure Laterality Date   COLONOSCOPY     EYE SURGERY     right eye removed at age 27   heart stent     x3- 2006    Social History  reports that he has quit smoking. His smoking use included cigarettes. He has a 40.00 pack-year smoking history. He has never used smokeless tobacco. He reports current alcohol use of about 30.0 standard drinks per week. He reports that he does not currently use drugs.  No Known Allergies  Family History  Problem Relation Age of Onset   Ovarian cancer Mother    Diabetes Maternal Grandfather    COPD Father    Diabetes Paternal Grandfather    Colon cancer Paternal Grandfather    Esophageal cancer Neg Hx    Rectal cancer Neg Hx    Stomach cancer Neg Hx     Prior to Admission medications   Medication Sig Start Date End Date Taking? Authorizing Provider  albuterol (PROVENTIL) (2.5 MG/3ML) 0.083% nebulizer solution Take 3  mLs (2.5 mg total) by nebulization every 4 (four) hours as needed for wheezing or shortness of breath. 01/17/19   Parrett, Fonnie Mu, NP  albuterol (VENTOLIN HFA) 108 (90 Base) MCG/ACT inhaler INHALE 2 PUFFS INTO THE LUNGS EVERY 4 HOURS AS NEEDED FOR WHEEZING AND SHORTNESS OF BREATH 12/25/20   [name], MD  apixaban (ELIQUIS) 5 MG TABS tablet Take 1 tablet (5 mg total) by mouth 2 (two) times daily. 03/18/21   [name], MD  aspirin 81 MG tablet Take 81 mg by mouth at bedtime.    [provider]  atorvastatin (LIPITOR) 40 MG tablet Take 1 tablet (40 mg total) by mouth at bedtime. 10/14/20   [name], MD  Budeson-Glycopyrrol-Formoterol (BREZTRI AEROSPHERE) 160-9-4.8 MCG/ACT AERO Inhale 2 puffs into the lungs 2 (two) times daily. 04/17/21   [name], MD  Cholecalciferol (D3 PO) Take 1 tablet by mouth daily.    [provider]  Cyanocobalamin (VITAMIN B-12 PO) Take 1 tablet by mouth daily.    [provider]  fluticasone (FLONASE) 50 MCG/ACT nasal spray USE 2 SPRAYS in EACH NOSTRIL EVERY DAY 04/17/20   [place], Modena Nunnery, MD  folic acid (FOLVITE) 1 MG tablet TAKE 1 TABLET BY MOUTH EVERY DAY Please make appointment WITH new pcp 03/19/21   [name], MD  LORazepam (ATIVAN) 0.5 MG tablet Take 1 tablet (0.5 mg total) by mouth 2 (two) times daily as needed for anxiety. 10/14/20   [name], MD  magnesium oxide (MAG-OX) 400 (240 Mg) MG tablet Take 1 tablet (400 mg total) by mouth daily. 03/16/21   Pokhrel, Corrie Mckusick, MD  metoprolol tartrate (LOPRESSOR) 25 MG tablet Take 1 tablet (25 mg total) by mouth 2 (two) times daily. 04/17/21   [name], MD  Multiple Vitamin (MULTIVITAMIN WITH MINERALS) TABS tablet Take 1 tablet by mouth daily. 03/09/20   Kayleen Memos, DO  nitroGLYCERIN (NITROSTAT) 0.4 MG SL tablet dissolve 1 TABLET UNDER THE TONGUE every 5 minutes AS NEEDED CHEST PAIN 04/17/21   [name], MD  omeprazole (PRILOSEC) 40 MG capsule Take 1 capsule (40 mg total) by mouth daily. 03/19/21   [name], MD  QUEtiapine (SEROQUEL) 25 MG tablet Take 1 tablet (25 mg total) by mouth at bedtime. 03/16/21   Pokhrel, Corrie Mckusick, MD  thiamine 100 MG tablet Take 5 tablets (500 mg total) by mouth daily. 03/17/21   Pokhrel, Corrie Mckusick, MD  traMADol (ULTRAM) 50 MG tablet TAKE 1 TABLET BY MOUTH EVERY 6 HOURS AS NEEDED FOR moderate PAIN 07/07/19   [name], MD  zinc gluconate 50 MG tablet Take 50 mg by mouth daily.    [provider]     Physical Exam: Vitals:   04/19/21 0630 04/19/21 0700 04/19/21 0730 04/19/21 0800  BP: 122/66 114/75 115/78 (!) 159/74  Pulse: 69 70 70 71  Resp: '11 14 12 13  '$ Temp:      TempSrc:      SpO2: 97% 97% 97% 97%  Weight:      Height:        Constitutional: NAD, calm, comfortable; reports no chest pain currently; breathing is a stable.  No nausea or vomiting. Vitals:   04/19/21 0630 04/19/21 0700 04/19/21 0730 04/19/21 0800  BP: 122/66 114/75 115/78 (!) 159/74  Pulse: 69 70 70 71  Resp: '11 14 12 13  '$ Temp:      TempSrc:      SpO2: 97% 97%  97% 97%  Weight:      Height:       Eyes: PERRL, lids and conjunctivae normal; no icterus, no nystagmus. ENMT: Mucous membranes are moist. Posterior pharynx clear of any exudate or lesions.nasal cannula supplementation in place. Neck: normal, supple, no masses, no thyromegaly, edema. Respiratory: Positive scattered rhonchi, no using accessory muscles, no wheezing. Cardiovascular: Regular rate, and currently sinus rhythm; No rubs, no gallops, no lower extremity edema. Abdomen: no tenderness, no masses palpated. No hepatosplenomegaly. Bowel sounds positive.  Musculoskeletal: no clubbing / cyanosis. No joint deformity upper and lower extremities. Good ROM, no contractures. Normal muscle tone.  Skin: no petechiae. Neurologic: CN 2-12 grossly intact.  No focal deficits; muscle strength 4 out of 5 bilaterally and symmetrically in the setting of poor effort. Psychiatric: Alert, awake and oriented x3; normal mood.  Labs on Admission: I have personally reviewed following labs and imaging studies  CBC: Recent Labs  Lab 04/19/21 0226  WBC 9.2  HGB 14.0  HCT 44.6  MCV 84.6  PLT [id number]    Basic Metabolic Panel: Recent Labs  Lab 04/19/21 0226 04/19/21 0441  NA 134*  --   K 3.4*  --   CL 99  --   CO2 29  --   GLUCOSE 133*  --   BUN 14  --   CREATININE 0.77  --   CALCIUM 8.1*  --   MG  --  1.5*  PHOS  --  3.2    GFR: Estimated Creatinine  Clearance: 66.2 mL/min (by C-G formula based on SCr of 0.77 mg/dL).  Liver Function Tests: Recent Labs  Lab 04/19/21 0233  AST 27  ALT 16  ALKPHOS 110  BILITOT 0.4  PROT 6.1*  ALBUMIN 3.3*    Urine analysis:    Component Value Date/Time   COLORURINE AMBER (A) 03/12/2021 0001   APPEARANCEUR CLEAR 03/12/2021 0001   LABSPEC 1.023 03/12/2021 0001   PHURINE 6.0 03/12/2021 0001   GLUCOSEU NEGATIVE 03/12/2021 0001   HGBUR NEGATIVE 03/12/2021 0001   BILIRUBINUR NEGATIVE 03/12/2021 0001   KETONESUR NEGATIVE 03/12/2021 0001   PROTEINUR 100 (A) 03/12/2021 0001   NITRITE NEGATIVE 03/12/2021 0001   LEUKOCYTESUR NEGATIVE 03/12/2021 0001    Radiological Exams on Admission: DG Chest Portable 1 View  Result Date: 04/19/2021 CLINICAL DATA:  Chest pain EXAM: PORTABLE CHEST 1 VIEW COMPARISON:  02/09/2021 FINDINGS: There is hyperinflation of the lungs compatible with COPD. Bibasilar scarring. No acute confluent opacities or effusions. Heart is normal size. IMPRESSION: COPD/chronic changes.  No active disease. Electronically Signed   By: Rolm Baptise M.D.   On: 04/19/2021 03:03    EKG: Independently reviewed.  Sinus rhythm appreciated currently; normal rate and no acute ischemic changes.  Assessment/Plan 1-Chest pain and ACS (acute coronary syndrome) [facility]): unstable angina. -Heart score 5 -Patient with admission a month ago secondary to NSTEMI; treated medically. -case discussed with Dr. Curt Bears (cardiology), recommended 48 hours of heparin, holding Eliquis while using drip; not need to repeat echo, maintain adequate rate control and electrolyte repletion. And will also start nitrates while using metoprolol. -if patient remains stable after 48 hours, he can be discharge home with outpatient follow up by his primary cardiologist Dr. [name]. -continue PRN NTG and cyclen EKG. -Continue statins and aspirin.  2-paroxysmal atrial fibrillation -Currently sinus rhythm and rate control -Continue  metoprolol -Patient will be for heparin drip and anticoagulation held meanwhile.  3-chronic respiratory failure with hypoxia secondary to COPD -No frank exacerbation currently -Continue  the use of Pulmicort and as needed bronchodilators. -Continue 2 L nasal cannula supplementation.  4-gastroesophageal reflux disease -continue PPI.  5-prior history of tobacco and alcohol abuse -Patient has quit since November 2021 -Advised to keep himself alcohol and tobacco free; patient congratulated.  6-hyperlipidemia -Continue Lipitor   7-chronic pain syndrome/anxiety -Continue home as needed analgesics and anxiolytics. -Stable mood currently.   DVT prophylaxis: Heparin drip Code Status:   Full code Family Communication:  Wife at bedside. Disposition Plan:   Patient is from:  Home  Anticipated DC to:  Home  Anticipated DC date:  04/21/2021  Anticipated DC barriers: Completion of treatment for unstable angina as recommended by cardiology service..  Consults called:  Cardiology service (Dr. Curt Bears) Admission status:  Telemetry bed, length of stay more than 2 midnights; inpatient status.  Severity of Illness: The appropriate patient status for this patient is INPATIENT. Inpatient status is judged to be reasonable and necessary in order to provide the required intensity of service to ensure the patient's safety. The patient's presenting symptoms, physical exam findings, and initial radiographic and laboratory data in the context of their chronic comorbidities is felt to place them at high risk for further clinical deterioration. Furthermore, it is not anticipated that the patient will be medically stable for discharge from the hospital within 2 midnights of admission. The following factors support the patient status of inpatient.   " The patient's presenting symptoms include chest pain, diaphoresis and shortness of breath. " The worrisome physical exam findings include characteristic and typical  history of chest discomfort on presentation for ACS/unstable angina. " The initial radiographic and laboratory data are worrisome because of elevated troponin. " The chronic co-morbidities include coronary artery disease, hypertension, hyperlipidemia, chronic respiratory failure with hypoxia due to COPD and paroxysmal atrial fibrillation (on chronic anticoagulation)..   * I certify that at the point of admission it is my clinical judgment that the patient will require inpatient hospital care spanning beyond 2 midnights from the point of admission due to high intensity of service, high risk for further deterioration and high frequency of surveillance required.Barton Dubois MD [facility]  How to contact the [facility] Attending or Consulting provider [place] or covering provider during after hours [place], for this patient?   Check the care team in [facility] and look for a) attending/consulting [facility] provider listed and b) the [facility] team listed Log into [email or web address] and use [place]'s universal password to access. If you do not have the password, please contact the hospital operator. Locate the [facility] provider you are looking for under [facility] and page to a number that you can be directly reached. If you still have difficulty reaching the provider, please page the [facility] (Director on Call) for the Hospitalists listed on amion for assistance.  04/19/2021, 8:51 AM

## 2021-04-19 NOTE — Progress Notes (Signed)
ANTICOAGULATION CONSULT NOTE - Initial Consult  Pharmacy Consult for heparin gtt  Indication: chest pain/ACS  No Known Allergies  Patient Measurements: Height: '5\' 8"'$  (172.7 cm) Weight: 55.3 kg (122 lb) IBW/kg (Calculated) : 68.4 Heparin Dosing Weight: HEPARIN DW (KG): 55.3   Vital Signs: Temp: 98.1 F (36.7 C) (09/03 2023) Temp Source: Oral (09/03 1630) BP: 136/79 (09/03 2023) Pulse Rate: 77 (09/03 2023)  Labs: Recent Labs    04/19/21 0226 04/19/21 1627  HGB 14.0  --   HCT 44.6  --   PLT 298  --   APTT  --  42*  HEPARINUNFRC  --  >1.10*  CREATININE 0.77  --      Estimated Creatinine Clearance: 66.2 mL/min (by C-G formula based on SCr of 0.77 mg/dL).   Medical History: Past Medical History:  Diagnosis Date   Alcohol abuse    Aortic atherosclerosis (HCC)    CAD in native artery    Chronic respiratory failure (HCC)    COPD (chronic obstructive pulmonary disease) (HCC)    DJD (degenerative joint disease)    diffusely   GERD (gastroesophageal reflux disease)    Hyperlipidemia    On home O2    Paroxysmal atrial fibrillation (HCC)    Pericardial effusion    Pneumonia due to COVID-19 virus    PUD (peptic ulcer disease)    with bleeding   Rotator cuff disorder    has been evaluated by Dr Clifton James and [facility]   Stroke (cerebrum) [facility])     Medications:  Medications Prior to Admission  Medication Sig Dispense Refill Last Dose   albuterol (PROVENTIL) (2.5 MG/3ML) 0.083% nebulizer solution Take 3 mLs (2.5 mg total) by nebulization every 4 (four) hours as needed for wheezing or shortness of breath. 180 mL 4 04/18/2021   albuterol (VENTOLIN HFA) 108 (90 Base) MCG/ACT inhaler INHALE 2 PUFFS INTO THE LUNGS EVERY 4 HOURS AS NEEDED FOR WHEEZING AND SHORTNESS OF BREATH (Patient taking differently: Inhale 2 puffs into the lungs every 4 (four) hours as needed for shortness of breath.) 18 g 0 04/18/2021   apixaban (ELIQUIS) 5 MG TABS tablet Take 1 tablet (5 mg total) by mouth 2  (two) times daily. 60 tablet 3 04/18/2021 at 0900   aspirin 81 MG tablet Take 81 mg by mouth at bedtime.   04/18/2021   atorvastatin (LIPITOR) 40 MG tablet Take 1 tablet (40 mg total) by mouth at bedtime. 90 tablet 3 Past Week   Budeson-Glycopyrrol-Formoterol (BREZTRI AEROSPHERE) 160-9-4.8 MCG/ACT AERO Inhale 2 puffs into the lungs 2 (two) times daily. 17.7 g 3 04/18/2021   Cholecalciferol (D3 PO) Take 1 tablet by mouth daily.   Past Week   fluticasone (FLONASE) 50 MCG/ACT nasal spray USE 2 SPRAYS in EACH NOSTRIL EVERY DAY 16 g 6 [id number]   folic acid (FOLVITE) 1 MG tablet TAKE 1 TABLET BY MOUTH EVERY DAY Please make appointment WITH new pcp 90 tablet 3 04/18/2021   LORazepam (ATIVAN) 0.5 MG tablet Take 1 tablet (0.5 mg total) by mouth 2 (two) times daily as needed for anxiety. 180 tablet 0 04/18/2021   magnesium oxide (MAG-OX) 400 (240 Mg) MG tablet Take 1 tablet (400 mg total) by mouth daily. 100 tablet 0 Past Week   metoprolol tartrate (LOPRESSOR) 25 MG tablet Take 1 tablet (25 mg total) by mouth 2 (two) times daily. 180 tablet 3 Past Week at unknown   Multiple Vitamin (MULTIVITAMIN WITH MINERALS) TABS tablet Take 1 tablet by mouth daily. 90 tablet  0 04/18/2021   nitroGLYCERIN (NITROSTAT) 0.4 MG SL tablet dissolve 1 TABLET UNDER THE TONGUE every 5 minutes AS NEEDED CHEST PAIN 25 tablet 0 04/18/2021   omeprazole (PRILOSEC) 40 MG capsule Take 1 capsule (40 mg total) by mouth daily. 90 capsule 3 04/18/2021   QUEtiapine (SEROQUEL) 25 MG tablet Take 1 tablet (25 mg total) by mouth at bedtime. 30 tablet 0 04/18/2021   traMADol (ULTRAM) 50 MG tablet TAKE 1 TABLET BY MOUTH EVERY 6 HOURS AS NEEDED FOR moderate PAIN 30 tablet 0 04/18/2021   zinc gluconate 50 MG tablet Take 50 mg by mouth daily.   04/18/2021   Cyanocobalamin (VITAMIN B-12 PO) Take 1 tablet by mouth daily.      thiamine 100 MG tablet Take 5 tablets (500 mg total) by mouth daily. (Patient not taking: No sig reported) 100 tablet 0 Not Taking   Scheduled:    [START ON 04/20/2021] aspirin EC  81 mg Oral Daily   atorvastatin  40 mg Oral QHS   budesonide (PULMICORT) nebulizer solution  0.5 mg Nebulization BID   fluticasone  2 spray Each Nare Daily   folic acid  1 mg Oral Daily   isosorbide mononitrate  15 mg Oral Daily   magnesium oxide  400 mg Oral Daily   metoprolol tartrate  25 mg Oral BID   multivitamin with minerals  1 tablet Oral Daily   pantoprazole  40 mg Oral Daily   QUEtiapine  25 mg Oral QHS   sodium chloride flush  3 mL Intravenous Q12H   thiamine  100 mg Oral Daily   zinc sulfate  220 mg Oral Daily   Infusions:   sodium chloride     heparin 650 Units/hr (04/19/21 1645)   PRN: sodium chloride, acetaminophen, albuterol, LORazepam, morphine injection, nitroGLYCERIN, ondansetron **OR** ondansetron (ZOFRAN) IV, sodium chloride flush, traMADol Anti-infectives (From admission, onward)    None       Assessment: Zachary Chambers a 70 y.o. male requires anticoagulation with a heparin iv infusion for the indication of  chest pain/ACS. Heparin gtt will be started following pharmacy protocol per pharmacy consult. Patient is on previous oral anticoagulant, apixaban, that will require aPTT/HL correlation before transitioning to only HL monitoring.   Aptt 42 seconds, subtherapeutic HL >1.10, not correlating yet   Goal of Therapy:  Heparin level 0.3-0.7 units/ml aPTT 66-102 seconds Monitor platelets by anticoagulation protocol: Yes   Plan:  Increase heparin infusion to 850 units/hr Check anti-Xa level and aPTT in 8 hours and daily while on heparin Continue to monitor H&H and platelets  Heparin level to be drawn 8 hours for patients >77 years old   Donna Christen Aubrianna Orchard 04/19/2021,8:34 PM

## 2021-04-19 NOTE — Progress Notes (Signed)
ANTICOAGULATION CONSULT NOTE - Initial Consult  Pharmacy Consult for heparin gtt  Indication: chest pain/ACS  No Known Allergies  Patient Measurements: Height: '5\' 8"'$  (172.7 cm) Weight: 55.3 kg (122 lb) IBW/kg (Calculated) : 68.4 Heparin Dosing Weight: HEPARIN DW (KG): 55.3   Vital Signs: Temp: 98.1 F (36.7 C) (09/03 0233) Temp Source: Oral (09/03 0233) BP: 159/74 (09/03 0800) Pulse Rate: 71 (09/03 0800)  Labs: Recent Labs    04/19/21 0226  HGB 14.0  HCT 44.6  PLT 298  CREATININE 0.77    Estimated Creatinine Clearance: 66.2 mL/min (by C-G formula based on SCr of 0.77 mg/dL).   Medical History: Past Medical History:  Diagnosis Date   Alcohol abuse    Aortic atherosclerosis (HCC)    CAD in native artery    Chronic respiratory failure (HCC)    COPD (chronic obstructive pulmonary disease) (HCC)    DJD (degenerative joint disease)    diffusely   GERD (gastroesophageal reflux disease)    Hyperlipidemia    On home O2    Paroxysmal atrial fibrillation (HCC)    Pericardial effusion    Pneumonia due to COVID-19 virus    PUD (peptic ulcer disease)    with bleeding   Rotator cuff disorder    has been evaluated by Dr Clifton James and [facility]   Stroke (cerebrum) [facility])     Medications:  (Not in a hospital admission)  Scheduled:   [START ON 04/20/2021] aspirin EC  81 mg Oral Daily   atorvastatin  40 mg Oral QHS   fluticasone  2 spray Each Nare Daily   folic acid  1 mg Oral Daily   heparin  3,000 Units Intravenous Once   magnesium oxide  400 mg Oral Daily   metoprolol tartrate  25 mg Oral BID   multivitamin with minerals  1 tablet Oral Daily   pantoprazole  40 mg Oral Daily   QUEtiapine  25 mg Oral QHS   sodium chloride flush  3 mL Intravenous Q12H   thiamine  100 mg Oral Daily   zinc sulfate  220 mg Oral Daily   Infusions:   sodium chloride     heparin     PRN: sodium chloride, acetaminophen, albuterol, LORazepam, morphine injection, nitroGLYCERIN, ondansetron  **OR** ondansetron (ZOFRAN) IV, sodium chloride flush, traMADol Anti-infectives (From admission, onward)    None       Assessment: Zachary Chambers a 70 y.o. male requires anticoagulation with a heparin iv infusion for the indication of  chest pain/ACS. Heparin gtt will be started following pharmacy protocol per pharmacy consult. Patient is on previous oral anticoagulant, apixaban, that will require aPTT/HL correlation before transitioning to only HL monitoring.   Goal of Therapy:  Heparin level 0.3-0.7 units/ml aPTT 66-102 seconds Monitor platelets by anticoagulation protocol: Yes   Plan:  Give 3000 units bolus x 1 Start heparin infusion at 650 units/hr Check anti-Xa level and aPTT in 8 hours and daily while on heparin Continue to monitor H&H and platelets  Heparin level to be drawn 8 hours for patients >65 years old   Zachary Chambers 04/19/2021,9:08 AM

## 2021-04-19 NOTE — ED Provider Notes (Signed)
[facility] EMERGENCY DEPARTMENT Provider Note   CSN: [id number] Arrival date & time: 04/19/21  0221     History Chief Complaint  Patient presents with   Chest Pain    Zachary Chambers is a 70 y.o. male.  male with a hx of CAD (inferolateral MI 2002 s/p PCI to LCx 2002 with staged PCI to LAD 2003) COPD/severe emphsema, chronic hypoxic respiratory failure (on O2 after Covid PNA 06/2020), HLD, ETOH use, cognitive decline. 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  Pain is now resolved.  States it was waxing waning for about 1 hour.  He is given aspirin and 2 nitroglycerin with relief of pain.  Reports pain is now resolved.  There is some associated shortness of breath.  No nausea, vomiting or diaphoresis.  No cough or fever.  Does have a history of CAD with stents.  Is not certain the last time he had this pain.  Feels back to baseline now.  Not dizzy or lightheaded. Does have a history of atrial fibrillation and takes eliquis.  The history is provided by the patient and the EMS personnel.  Chest Pain Associated symptoms: shortness of breath   Associated symptoms: no abdominal pain, no cough, no dizziness, no fever, no headache, no nausea, no vomiting and no weakness       Past Medical History:  Diagnosis Date   Alcohol abuse    Aortic atherosclerosis (HCC)    CAD in native artery    Chronic respiratory failure (HCC)    COPD (chronic obstructive pulmonary disease) (HCC)    DJD (degenerative joint disease)    diffusely   GERD (gastroesophageal reflux disease)    Hyperlipidemia    On home O2    Paroxysmal atrial fibrillation (HCC)    Pericardial effusion    Pneumonia due to COVID-19 virus    PUD (peptic ulcer disease)    with bleeding   Rotator cuff disorder    has been evaluated by Dr Clifton James and  [facility]   Stroke (cerebrum) [facility])     Patient Active Problem List   Diagnosis Date Noted   Metabolic encephalopathy 0000000   Hypokalemia 123456   Acute metabolic encephalopathy 123456   Hypoxemia 12/10/2020   COPD exacerbation ([place]) 12/09/2020   Acute on chronic respiratory failure with hypoxia ([place]) 12/09/2020   Demand ischemia ([place]) 12/09/2020   Anxiety 10/07/2020   History of CVA with residual deficit 10/07/2020   Pneumonia due to COVID-19 virus 07/05/2020   Chronic respiratory failure ([place]) 07/05/2020   Transient atrial fibrillation ([place]) 07/05/2020   Dyspnea 07/04/2020   Protein-calorie malnutrition, severe 03/07/2020   S/P coronary artery stent placement    Chest pain 03/06/2020   Dyslipidemia 03/06/2020   Healthcare maintenance 03/05/2020   Heavy alcohol use 03/04/2018   Memory changes 03/04/2018   Leukopenia 05/04/2017   Bruit 01/22/2014   Muscle spasm of back 09/20/2013   COPD (chronic obstructive pulmonary disease) ([place]) 01/14/2011   CAD (coronary artery disease) 01/14/2011   GERD (gastroesophageal reflux disease) 01/14/2011   TOBACCO ABUSE 07/25/2009   Osteoarthritis 07/25/2009   ROTATOR CUFF SYNDROME, RIGHT 07/25/2009    Past Surgical History:  Procedure Laterality Date   COLONOSCOPY     EYE SURGERY     right eye removed at age 57   heart stent  x3- 2006       Family History  Problem Relation Age of Onset   Ovarian cancer Mother    Diabetes Maternal Grandfather    COPD Father    Diabetes Paternal Grandfather    Colon cancer Paternal Grandfather    Esophageal cancer Neg Hx    Rectal cancer Neg Hx    Stomach cancer Neg Hx     Social History   Tobacco Use   Smoking status: Former    Packs/day: 1.00    Years: 40.00    Pack years: 40.00    Types: Cigarettes   Smokeless tobacco: Never   Tobacco comments:    declines patch  Vaping Use   Vaping Use: Never used  Substance Use Topics   Alcohol use: Yes    Alcohol/week:  30.0 standard drinks    Types: 30 Cans of beer per week    Comment: h/o heavy use, now 4 beers per day   Drug use: Not Currently    Home Medications Prior to Admission medications   Medication Sig Start Date End Date Taking? Authorizing Provider  albuterol (PROVENTIL) (2.5 MG/3ML) 0.083% nebulizer solution Take 3 mLs (2.5 mg total) by nebulization every 4 (four) hours as needed for wheezing or shortness of breath. 01/17/19   Parrett, Fonnie Mu, NP  albuterol (VENTOLIN HFA) 108 (90 Base) MCG/ACT inhaler INHALE 2 PUFFS INTO THE LUNGS EVERY 4 HOURS AS NEEDED FOR WHEEZING AND SHORTNESS OF BREATH 12/25/20   [name], MD  apixaban (ELIQUIS) 5 MG TABS tablet Take 1 tablet (5 mg total) by mouth 2 (two) times daily. 03/18/21   [name], MD  aspirin 81 MG tablet Take 81 mg by mouth at bedtime.    [provider]  atorvastatin (LIPITOR) 40 MG tablet Take 1 tablet (40 mg total) by mouth at bedtime. 10/14/20   [name], MD  Budeson-Glycopyrrol-Formoterol (BREZTRI AEROSPHERE) 160-9-4.8 MCG/ACT AERO Inhale 2 puffs into the lungs 2 (two) times daily. 04/17/21   [name], MD  Cholecalciferol (D3 PO) Take 1 tablet by mouth daily.    [provider]  Cyanocobalamin (VITAMIN B-12 PO) Take 1 tablet by mouth daily.    [provider]  fluticasone (FLONASE) 50 MCG/ACT nasal spray USE 2 SPRAYS in EACH NOSTRIL EVERY DAY 04/17/20   Maricao, Modena Nunnery, MD  folic acid (FOLVITE) 1 MG tablet TAKE 1 TABLET BY MOUTH EVERY DAY Please make appointment WITH new pcp 03/19/21   [name], MD  LORazepam (ATIVAN) 0.5 MG tablet Take 1 tablet (0.5 mg total) by mouth 2 (two) times daily as needed for anxiety. 10/14/20   [name], MD  magnesium oxide (MAG-OX) 400 (240 Mg) MG tablet Take 1 tablet (400 mg total) by mouth daily. 03/16/21   Pokhrel, Corrie Mckusick, MD  metoprolol tartrate (LOPRESSOR) 25 MG tablet Take 1 tablet (25 mg total) by mouth 2 (two) times daily. 04/17/21   [name], MD  Multiple Vitamin (MULTIVITAMIN WITH MINERALS) TABS tablet Take 1 tablet by mouth daily. 03/09/20   Kayleen Memos, DO  nitroGLYCERIN (NITROSTAT) 0.4 MG SL tablet dissolve 1 TABLET UNDER THE TONGUE every 5 minutes AS NEEDED CHEST PAIN 04/17/21   [name], MD  omeprazole (PRILOSEC) 40 MG capsule Take 1 capsule (40 mg total) by mouth daily. 03/19/21   [name], MD  QUEtiapine (SEROQUEL) 25 MG tablet Take 1 tablet (25 mg total) by mouth at bedtime. 03/16/21  Pokhrel, Laxman, MD  thiamine 100 MG tablet Take 5 tablets (500 mg total) by mouth daily. 03/17/21   Pokhrel, Corrie Mckusick, MD  traMADol (ULTRAM) 50 MG tablet TAKE 1 TABLET BY MOUTH EVERY 6 HOURS AS NEEDED FOR moderate PAIN 07/07/19   [name], MD  zinc gluconate 50 MG tablet Take 50 mg by mouth daily.    [provider]    Allergies    Patient has no known allergies.  Review of Systems   Review of Systems  Constitutional:  Negative for activity change, appetite change and fever.  HENT:  Negative for congestion and rhinorrhea.   Respiratory:  Positive for chest tightness and shortness of breath. Negative for cough.   Cardiovascular:  Positive for chest pain.  Gastrointestinal:  Negative for abdominal pain, nausea and vomiting.  Genitourinary:  Negative for dysuria and hematuria.  Musculoskeletal:  Negative for arthralgias and myalgias.  Skin:  Negative for wound.  Neurological:  Negative for dizziness, weakness and headaches.   all other systems are negative except as noted in the HPI and PMH.   Physical Exam Updated Vital Signs BP 115/87 (BP Location: Right Arm)   Pulse 95   Temp 98.1 F (36.7 C) (Oral)   Resp 16   Ht '5\' 8"'$  (1.727 m)   Wt 55.3 kg   SpO2 96%   BMI 18.55 kg/m   Physical Exam Vitals and nursing note reviewed.  Constitutional:      General: He is not in acute distress.    Appearance: He is well-developed.  HENT:     Head: Normocephalic and atraumatic.     Mouth/Throat:      Pharynx: No oropharyngeal exudate.  Eyes:     Conjunctiva/sclera: Conjunctivae normal.     Pupils: Pupils are equal, round, and reactive to light.  Neck:     Comments: No meningismus. Cardiovascular:     Rate and Rhythm: Normal rate and regular rhythm.     Heart sounds: Normal heart sounds. No murmur heard.    Comments: Equal radial pulses and grip strengths. Pulmonary:     Effort: Pulmonary effort is normal. No respiratory distress.     Breath sounds: Normal breath sounds.  Chest:     Chest wall: No tenderness.  Abdominal:     Palpations: Abdomen is soft.     Tenderness: There is no abdominal tenderness. There is no guarding or rebound.  Musculoskeletal:        General: No tenderness. Normal range of motion.     Cervical back: Normal range of motion and neck supple.  Skin:    General: Skin is warm.  Neurological:     Mental Status: He is alert and oriented to person, place, and time.     Cranial Nerves: No cranial nerve deficit.     Motor: No abnormal muscle tone.     Coordination: Coordination normal.     Comments:  5/5 strength throughout. CN 2-12 intact.Equal grip strength.   Psychiatric:        Behavior: Behavior normal.    ED Results / Procedures / Treatments   Labs (all labs ordered are listed, but only abnormal results are displayed) Labs Reviewed  BASIC METABOLIC PANEL - Abnormal; Notable for the following components:      Result Value   Sodium 134 (*)    Potassium 3.4 (*)    Glucose, Bld 133 (*)    Calcium 8.1 (*)    All other components within normal limits  CBC - Abnormal; Notable for the following components:   RDW 17.4 (*)    All other components within normal limits  HEPATIC FUNCTION PANEL - Abnormal; Notable for the following components:   Total Protein 6.1 (*)    Albumin 3.3 (*)    All other components within normal limits  TROPONIN I (HIGH SENSITIVITY) - Abnormal; Notable for the following components:   Troponin I (High Sensitivity) 118 (*)     All other components within normal limits  RESP PANEL BY RT-PCR (FLU A&B, COVID) [id number]  LIPASE, BLOOD  MAGNESIUM  PHOSPHORUS  TROPONIN I (HIGH SENSITIVITY)    EKG EKG Interpretation  Date/Time:  Saturday April 19 2021 04:58:08 EDT Ventricular Rate:  76 PR Interval:  126 QRS Duration: 85 QT Interval:  347 QTC Calculation: 391 R Axis:   77 Text Interpretation: Sinus rhythm No significant change was found Confirmed by Ezequiel Essex [id number]) on 04/19/2021 5:08:47 AM  Radiology DG Chest Portable 1 View  Result Date: 04/19/2021 CLINICAL DATA:  Chest pain EXAM: PORTABLE CHEST 1 VIEW COMPARISON:  02/09/2021 FINDINGS: There is hyperinflation of the lungs compatible with COPD. Bibasilar scarring. No acute confluent opacities or effusions. Heart is normal size. IMPRESSION: COPD/chronic changes.  No active disease. Electronically Signed   By: Rolm Baptise M.D.   On: 04/19/2021 03:03    Procedures Procedures   Medications Ordered in ED Medications - No data to display  ED Course  I have reviewed the triage vital signs and the nursing notes.  Pertinent labs & imaging results that were available during my care of the patient were reviewed by me and considered in my medical decision making (see chart for details).    MDM Rules/Calculators/A&P                          Episode of chest pain at rest now resolved.  EKG without acute ST elevation.  Patient received aspirin and nitroglycerin.  Patient was hospitalized in July 2022 with positive troponins but ACS was medically managed at that time.  Troponin 118.  This is improved from previous values in July when his troponins were in the 5-600 range.  He remains chest pain-free  Suspicion for pulmonary embolism, aortic dissection, pneumonia, pneumothorax.  Patient remains chest pain-free.  His wife has arrived who stated that his blood pressure was elevated at home he was gripping his chest with discomfort.  Second troponin  pending.  Cardiology consultation from July reviewed.  Patient was decided to be a poor candidate for any kind of intervention.  His echocardiogram was normal at that time.  It was decided his coronary disease to be managed medically.  Patient is high risk for ACS.  We will plan observation admission for cardiology evaluation later this morning. Discussed with Dr. Olevia Bowens.  Final Clinical Impression(s) / ED Diagnoses Final diagnoses:  None    Rx / DC Orders ED Discharge Orders     None        Cortez Flippen, Annie Main, MD 04/19/21 [phone]

## 2021-04-19 NOTE — ED Notes (Signed)
Date and time results received: 04/19/21 0326 (use smartphrase ".now" to insert current time)  Test: troponin Critical Value: 118  Name of Provider Notified: Dr. Wyvonnia Dusky  Orders Received? Or Actions Taken?: no new orders at this time

## 2021-04-20 LAB — CBC
HCT: 42.1 % (ref 39.0–52.0)
Hemoglobin: 13.3 g/dL (ref 13.0–17.0)
MCH: 26.8 pg (ref 26.0–34.0)
MCHC: 31.6 g/dL (ref 30.0–36.0)
MCV: 84.7 fL (ref 80.0–100.0)
Platelets: 384 10*3/uL (ref 150–400)
RBC: 4.97 MIL/uL (ref 4.22–5.81)
RDW: 17.6 % — ABNORMAL HIGH (ref 11.5–15.5)
WBC: 7.4 10*3/uL (ref 4.0–10.5)
nRBC: 0 % (ref 0.0–0.2)

## 2021-04-20 LAB — BASIC METABOLIC PANEL
Anion gap: 6 (ref 5–15)
BUN: 12 mg/dL (ref 8–23)
CO2: 29 mmol/L (ref 22–32)
Calcium: 8.6 mg/dL — ABNORMAL LOW (ref 8.9–10.3)
Chloride: 99 mmol/L (ref 98–111)
Creatinine, Ser: 0.78 mg/dL (ref 0.61–1.24)
GFR, Estimated: >60 mL/min (ref >60)
Glucose, Bld: 97 mg/dL (ref 70–99)
Potassium: 4.1 mmol/L (ref 3.5–5.1)
Sodium: 134 mmol/L — ABNORMAL LOW (ref 135–145)

## 2021-04-20 LAB — HEPARIN LEVEL (UNFRACTIONATED)
Heparin Unfractionated: 0.9 IU/mL — ABNORMAL HIGH (ref 0.30–0.70)
Heparin Unfractionated: 0.82 IU/mL — ABNORMAL HIGH (ref 0.30–0.70)

## 2021-04-20 LAB — APTT
aPTT: 51 seconds — ABNORMAL HIGH (ref 24–36)
aPTT: 56 seconds — ABNORMAL HIGH (ref 24–36)
aPTT: 58 seconds — ABNORMAL HIGH (ref 24–36)

## 2021-04-20 MED ORDER — HEPARIN BOLUS VIA INFUSION
2000.0000 [IU] | Freq: Once | INTRAVENOUS | Status: AC
  Administered 2021-04-20: 2000 [IU] via INTRAVENOUS
  Filled 2021-04-20: qty 2000

## 2021-04-20 NOTE — Progress Notes (Signed)
PROGRESS NOTE    Zachary Chambers  [id number] DOB: September 04, 1950 DOA: 04/19/2021 PCP: [name], MD   Chief Complaint  Patient presents with   Chest Pain    Brief admission Narrative:  Zachary Chambers is a 70 y.o. male with medical history significant of chronic respiratory failure due to COPD (using 2-3 L nasal cannula supplementation at baseline), gastroesophageal flux disease, hyperlipidemia, history of paroxysmal atrial fibrillation disease chronically on Eliquis) and myocardial infarction; who presented to the hospital secondary to chest pain and shortness of breath.  Patient reports symptoms started while watching TV at nighttime, localized in the middle of his chest and radiated to both of his shoulders.  There was associated shortness of breath and per wife reports diaphoresis while the pain was present.  Patient received aspirin and nitroglycerin with some improvement in his discomfort in the way to the emergency department. Patient denies nausea, vomiting, dysuria, hematuria, melena, hematochezia, fever, focal weakness or any other complaints.   COVID PCR is negative in the ED.   Of note, patient presented with acute coronary syndrome about 1 month ago found not to be eligible for invasive ischemic work-up and was decided to focus on medical management.   Assessment & Plan: 1-chest pain/ACS/unstable angina. -Continue 24 more hours of heparin infusion as recommended by cardiology service -Continue metoprolol and Imdur -Patient is chest pain-free currently. -Continue aspirin and statins -Continue as needed nitroglycerin. -No acute ischemic changes on telemetry appreciated.  2-paroxysmal atrial fibrillation -Sinus rhythm and rate controlled currently -Continue metoprolol -Continue holding Eliquis while receiving heparin drip infusion.  3-chronic respiratory failure with hypoxia secondary to COPD -No wheezing on exam -Patient expressed feeling slightly short of  breath -Continue Pulmicort and as needed bronchodilator -Continue 2 L nasal cannula supplementation.  4-hyperlipidemia -Continue statins.  5-chronic pain syndrome/anxiety -Continue home analgesics and anxiolytic medications.  6-gastroesophageal reflux disease -Continue PPI.   DVT prophylaxis:  Code Status:  Family Communication: No family at bedside. Disposition:   Status is: Inpatient  Remains inpatient appropriate because:IV treatments appropriate due to intensity of illness or inability to take PO  Dispo: The patient is from: Home              Anticipated d/c is to: Home              Patient currently is not medically stable to d/c.   Difficult to place patient No     Consultants:  Cardiology curbside.  Procedures:  See below for x-ray reports.   Antimicrobials:  None    Subjective: Patient denies chest pain, nausea, vomiting or blood in stool.  Objective: Vitals:   04/20/21 0422 04/20/21 0500 04/20/21 0811 04/20/21 1323  BP: 116/83   121/85  Pulse: 66   68  Resp: 18   19  Temp: 98.2 F (36.8 C)   98.8 F (37.1 C)  TempSrc:    Oral  SpO2: 98%  97% 96%  Weight:  55.4 kg    Height:        Intake/Output Summary (Last 24 hours) at 04/20/2021 1826 Last data filed at 04/20/2021 0913 Gross per 24 hour  Intake 829.63 ml  Output 1100 ml  Net -270.37 ml   Filed Weights   04/19/21 0228 04/20/21 0500  Weight: 55.3 kg 55.4 kg    Examination:  General exam: Appears calm and comfortable; reports no chest pain currently but expressed feeling slightly short of breath.  No nausea, no vomiting, no fever. Respiratory system:  Good air movement bilaterally; positive rhonchi it, no wheezing on exam. Cardiovascular system: Rate controlled, no rubs, no gallops, no JVD. Gastrointestinal system: Abdomen is nondistended, soft and nontender. No organomegaly or masses felt. Normal bowel sounds heard. Central nervous system: Alert and oriented. No focal neurological  deficits. Extremities: No cyanosis or clubbing. Skin: No petechiae. Psychiatry: Judgement and insight appear normal. Mood & affect appropriate.     Data Reviewed: I have personally reviewed following labs and imaging studies  CBC: Recent Labs  Lab 04/19/21 0226 04/20/21 0319  WBC 9.2 7.4  HGB 14.0 13.3  HCT 44.6 42.1  MCV 84.6 84.7  PLT 298 0000000    Basic Metabolic Panel: Recent Labs  Lab 04/19/21 0226 04/19/21 0441 04/19/21 0939 04/20/21 0319  NA 134*  --   --  134*  K 3.4*  --   --  4.1  CL 99  --   --  99  CO2 29  --   --  29  GLUCOSE 133*  --   --  97  BUN 14  --   --  12  CREATININE 0.77  --   --  0.78  CALCIUM 8.1*  --   --  8.6*  MG  --  1.5* 2.3  --   PHOS  --  3.2  --   --     GFR: Estimated Creatinine Clearance: 66.4 mL/min (by C-G formula based on SCr of 0.78 mg/dL).  Liver Function Tests: Recent Labs  Lab 04/19/21 0233  AST 27  ALT 16  ALKPHOS 110  BILITOT 0.4  PROT 6.1*  ALBUMIN 3.3*    CBG: No results for input(s): GLUCAP in the last 168 hours.   Recent Results (from the past 240 hour(s))  Resp Panel by RT-PCR (Flu A&B, Covid) Nasopharyngeal Swab     Status: None   Collection Time: 04/19/21  3:58 AM   Specimen: Nasopharyngeal Swab; Nasopharyngeal(NP) swabs in vial transport medium  Result Value Ref Range Status   SARS Coronavirus 2 by RT PCR NEGATIVE NEGATIVE Final    Comment: (NOTE) SARS-CoV-2 target nucleic acids are NOT DETECTED.  The SARS-CoV-2 RNA is generally detectable in upper respiratory specimens during the acute phase of infection. The lowest concentration of SARS-CoV-2 viral copies this assay can detect is 138 copies/mL. A negative result does not preclude SARS-Cov-2 infection and should not be used as the sole basis for treatment or other patient management decisions. A negative result may occur with  improper specimen collection/handling, submission of specimen other than nasopharyngeal swab, presence of viral  mutation(s) within the areas targeted by this assay, and inadequate number of viral copies(<138 copies/mL). A negative result must be combined with clinical observations, patient history, and epidemiological information. The expected result is Negative.  Fact Sheet for Patients:  EntrepreneurPulse.com.au  Fact Sheet for Healthcare Providers:  IncredibleEmployment.be  This test is no t yet approved or cleared by the [place] FDA and  has been authorized for detection and/or diagnosis of SARS-CoV-2 by FDA under an Emergency Use Authorization (EUA). This EUA will remain  in effect (meaning this test can be used) for the duration of the COVID-19 declaration under Section 564(b)(1) of the Act, 21 U.S.C.section 360bbb-3(b)(1), unless the authorization is terminated  or revoked sooner.       Influenza A by PCR NEGATIVE NEGATIVE Final   Influenza B by PCR NEGATIVE NEGATIVE Final    Comment: (NOTE) The Xpert Xpress SARS-CoV-2/FLU/RSV plus assay is intended as an aid  in the diagnosis of influenza from Nasopharyngeal swab specimens and should not be used as a sole basis for treatment. Nasal washings and aspirates are unacceptable for Xpert Xpress SARS-CoV-2/FLU/RSV testing.  Fact Sheet for Patients: EntrepreneurPulse.com.au  Fact Sheet for Healthcare Providers: IncredibleEmployment.be  This test is not yet approved or cleared by the [place] FDA and has been authorized for detection and/or diagnosis of SARS-CoV-2 by FDA under an Emergency Use Authorization (EUA). This EUA will remain in effect (meaning this test can be used) for the duration of the COVID-19 declaration under Section 564(b)(1) of the Act, 21 U.S.C. section 360bbb-3(b)(1), unless the authorization is terminated or revoked.  Performed at [facility], [address] [place], [place] [address]      Radiology Studies: DG Chest Portable 1  View  Result Date: 04/19/2021 CLINICAL DATA:  Chest pain EXAM: PORTABLE CHEST 1 VIEW COMPARISON:  02/09/2021 FINDINGS: There is hyperinflation of the lungs compatible with COPD. Bibasilar scarring. No acute confluent opacities or effusions. Heart is normal size. IMPRESSION: COPD/chronic changes.  No active disease. Electronically Signed   By: Rolm Baptise M.D.   On: 04/19/2021 03:03     Scheduled Meds:  aspirin EC  81 mg Oral Daily   atorvastatin  40 mg Oral QHS   budesonide (PULMICORT) nebulizer solution  0.5 mg Nebulization BID   fluticasone  2 spray Each Nare Daily   folic acid  1 mg Oral Daily   isosorbide mononitrate  15 mg Oral Daily   magnesium oxide  400 mg Oral Daily   metoprolol tartrate  25 mg Oral BID   multivitamin with minerals  1 tablet Oral Daily   pantoprazole  40 mg Oral Daily   QUEtiapine  25 mg Oral QHS   sodium chloride flush  3 mL Intravenous Q12H   thiamine  100 mg Oral Daily   zinc sulfate  220 mg Oral Daily   Continuous Infusions:  sodium chloride     heparin 1,200 Units/hr (04/20/21 1753)     LOS: 1 day    Time spent: 35 minutes    Barton Dubois, MD [facility]   To contact the attending provider between 7A-7P or the covering provider during after hours 7P-7A, please log into the web site [email or web address] and access using universal [place] password for that web site. If you do not have the password, please call the hospital operator.  04/20/2021, 6:26 PM

## 2021-04-20 NOTE — Progress Notes (Signed)
[place] for heparin gtt  Indication: chest pain/ACS  No Known Allergies  Patient Measurements: Height: '5\' 8"'$  (172.7 cm) Weight: 55.4 kg (122 lb 2.2 oz) IBW/kg (Calculated) : 68.4 Heparin Dosing Weight: HEPARIN DW (KG): 55.3   Vital Signs: Temp: 98.2 F (36.8 C) (09/04 0422) BP: 116/83 (09/04 0422) Pulse Rate: 66 (09/04 0422)  Labs: Recent Labs    04/19/21 0226 04/19/21 1627 04/20/21 0319 04/20/21 1144  HGB 14.0  --  13.3  --   HCT 44.6  --  42.1  --   PLT 298  --  384  --   APTT  --  42* 51* 56*  HEPARINUNFRC  --  >1.10* 0.90*  --   CREATININE 0.77  --  0.78  --      Estimated Creatinine Clearance: 66.4 mL/min (by C-G formula based on SCr of 0.78 mg/dL).   Assessment: Zachary Chambers a 70 y.o. male requires anticoagulation with a heparin iv infusion for the indication of  chest pain/ACS. Heparin gtt will be started following pharmacy protocol per pharmacy consult. Patient is on previous oral anticoagulant, apixaban, that will require aPTT/HL correlation before transitioning to only HL monitoring.   aPTT 56sec (remains subtherapeutic) on gtt at 1000 units/hr. No issues with line or bleeding reported per RN.  Goal of Therapy:  Heparin level 0.3-0.7 units/ml aPTT 66-102 seconds Monitor platelets by anticoagulation protocol: Yes   Plan:  Increase heparin infusion to 1200 units/hr F/u 8 hr heparin level  Thomasenia Sales, PharmD, MBA, BCGP Clinical Pharmacist  04/20/2021,12:50 PM

## 2021-04-20 NOTE — Progress Notes (Signed)
[place] for heparin gtt  Indication: chest pain/ACS  No Known Allergies  Patient Measurements: Height: '5\' 8"'$  (172.7 cm) Weight: 55.3 kg (122 lb) IBW/kg (Calculated) : 68.4 Heparin Dosing Weight: HEPARIN DW (KG): 55.3   Vital Signs: Temp: 98 F (36.7 C) (09/03 2340) Temp Source: Oral (09/03 2340) BP: 131/72 (09/03 2340) Pulse Rate: 70 (09/03 2340)  Labs: Recent Labs    04/19/21 0226 04/19/21 1627 04/20/21 0319  HGB 14.0  --  13.3  HCT 44.6  --  42.1  PLT 298  --  384  APTT  --  42* 51*  HEPARINUNFRC  --  >1.10* 0.90*  CREATININE 0.77  --   --      Estimated Creatinine Clearance: 66.2 mL/min (by C-G formula based on SCr of 0.77 mg/dL).   Assessment: Zachary Chambers a 70 y.o. male requires anticoagulation with a heparin iv infusion for the indication of  chest pain/ACS. Heparin gtt will be started following pharmacy protocol per pharmacy consult. Patient is on previous oral anticoagulant, apixaban, that will require aPTT/HL correlation before transitioning to only HL monitoring.   Heparin level 0.9 (affected by apixaban), aPTT 51 sec (remains subtherapeutic) on gtt at 850 units/hr. No issues with line or bleeding reported per RN.  Goal of Therapy:  Heparin level 0.3-0.7 units/ml aPTT 66-102 seconds Monitor platelets by anticoagulation protocol: Yes   Plan:  Increase heparin infusion to 1000 units/hr F/u 8 hr heparin level  Sherlon Handing, PharmD, BCPS Please see amion for complete clinical pharmacist phone list 04/20/2021,4:19 AM

## 2021-04-20 NOTE — Progress Notes (Signed)
[place] for heparin gtt  Indication: chest pain/ACS  No Known Allergies  Patient Measurements: Height: '5\' 8"'$  (172.7 cm) Weight: 55.4 kg (122 lb 2.2 oz) IBW/kg (Calculated) : 68.4 Heparin Dosing Weight: HEPARIN DW (KG): 55.3   Vital Signs: Temp: 98.8 F (37.1 C) (09/04 1323) Temp Source: Oral (09/04 1323) BP: 121/85 (09/04 1323) Pulse Rate: 68 (09/04 1323)  Labs: Recent Labs    04/19/21 0226 04/19/21 1627 04/19/21 1627 04/20/21 0319 04/20/21 1144 04/20/21 1923  HGB 14.0  --   --  13.3  --   --   HCT 44.6  --   --  42.1  --   --   PLT 298  --   --  384  --   --   APTT  --  42*   < > 51* 56* 58*  HEPARINUNFRC  --  >1.10*  --  0.90*  --  0.82*  CREATININE 0.77  --   --  0.78  --   --    < > = values in this interval not displayed.     Estimated Creatinine Clearance: 66.4 mL/min (by C-G formula based on SCr of 0.78 mg/dL).   Assessment: Zachary Chambers a 70 y.o. male requires anticoagulation with a heparin iv infusion for the indication of  chest pain/ACS. Heparin gtt will be started following pharmacy protocol per pharmacy consult. Patient is on previous oral anticoagulant, apixaban, that will require aPTT/HL correlation before transitioning to only HL monitoring.   aPTT 58sec (remains subtherapeutic) on gtt at 1200 units/hr. No issues with line or bleeding reported per RN.  Goal of Therapy:  Heparin level 0.3-0.7 units/ml aPTT 66-102 seconds Monitor platelets by anticoagulation protocol: Yes   Plan:  Increase heparin infusion to 1400 units/hr F/u 8 hr heparin level  Thomasenia Sales, PharmD, MBA, BCGP Clinical Pharmacist  04/20/2021,8:27 PM

## 2021-04-21 DIAGNOSIS — I10 Essential (primary) hypertension: Secondary | ICD-10-CM

## 2021-04-21 DIAGNOSIS — F418 Other specified anxiety disorders: Secondary | ICD-10-CM

## 2021-04-21 LAB — CBC
HCT: 43.9 % (ref 39.0–52.0)
Hemoglobin: 14.1 g/dL (ref 13.0–17.0)
MCH: 26.7 pg (ref 26.0–34.0)
MCHC: 32.1 g/dL (ref 30.0–36.0)
MCV: 83 fL (ref 80.0–100.0)
Platelets: 447 10*3/uL — ABNORMAL HIGH (ref 150–400)
RBC: 5.29 MIL/uL (ref 4.22–5.81)
RDW: 17.5 % — ABNORMAL HIGH (ref 11.5–15.5)
WBC: 7.6 10*3/uL (ref 4.0–10.5)
nRBC: 0 % (ref 0.0–0.2)

## 2021-04-21 LAB — BASIC METABOLIC PANEL
Anion gap: 9 (ref 5–15)
BUN: 11 mg/dL (ref 8–23)
CO2: 32 mmol/L (ref 22–32)
Calcium: 9.4 mg/dL (ref 8.9–10.3)
Chloride: 95 mmol/L — ABNORMAL LOW (ref 98–111)
Creatinine, Ser: 0.75 mg/dL (ref 0.61–1.24)
GFR, Estimated: >60 mL/min (ref >60)
Glucose, Bld: 97 mg/dL (ref 70–99)
Potassium: 3.9 mmol/L (ref 3.5–5.1)
Sodium: 136 mmol/L (ref 135–145)

## 2021-04-21 LAB — APTT
aPTT: 106 seconds — ABNORMAL HIGH (ref 24–36)
aPTT: 110 seconds — ABNORMAL HIGH (ref 24–36)

## 2021-04-21 LAB — HEPARIN LEVEL (UNFRACTIONATED)
Heparin Unfractionated: >1.1 IU/mL — ABNORMAL HIGH (ref 0.30–0.70)
Heparin Unfractionated: 0.92 IU/mL — ABNORMAL HIGH (ref 0.30–0.70)

## 2021-04-21 MED ORDER — ISOSORBIDE MONONITRATE ER 30 MG PO TB24: 15.0000 mg | ORAL_TABLET | Freq: Every day | ORAL | 2 refills | Status: DC

## 2021-04-21 NOTE — Plan of Care (Signed)
  Problem: Education: Goal: Knowledge of General Education information will improve Description: Including pain rating scale, medication(s)/side effects and non-pharmacologic comfort measures Outcome: Progressing   Problem: Health Behavior/Discharge Planning: Goal: Ability to manage health-related needs will improve Outcome: Progressing   Problem: Clinical Measurements: Goal: Ability to maintain clinical measurements within normal limits will improve Outcome: Progressing Goal: Will remain free from infection Outcome: Progressing Goal: Diagnostic test results will improve Outcome: Progressing Goal: Respiratory complications will improve Outcome: Progressing Goal: Cardiovascular complication will be avoided Outcome: Progressing   Problem: Activity: Goal: Risk for activity intolerance will decrease Outcome: Progressing   Problem: Nutrition: Goal: Adequate nutrition will be maintained Outcome: Progressing   Problem: Coping: Goal: Level of anxiety will decrease Outcome: Progressing   Problem: Elimination: Goal: Will not experience complications related to bowel motility Outcome: Progressing Goal: Will not experience complications related to urinary retention Outcome: Progressing   Problem: Pain Managment: Goal: General experience of comfort will improve Outcome: Progressing   Problem: Safety: Goal: Ability to remain free from injury will improve Outcome: Progressing   Problem: Skin Integrity: Goal: Risk for impaired skin integrity will decrease Outcome: Progressing   Problem: Activity: Goal: Ability to tolerate increased activity will improve Outcome: Progressing Goal: Will verbalize the importance of balancing activity with adequate rest periods Outcome: Progressing   Problem: Respiratory: Goal: Ability to maintain a clear airway will improve Outcome: Progressing Goal: Levels of oxygenation will improve Outcome: Progressing Goal: Ability to maintain adequate  ventilation will improve Outcome: Progressing   

## 2021-04-21 NOTE — Discharge Summary (Signed)
Physician Discharge Summary  Zachary Chambers [id number] DOB: 25-Aug-1950 DOA: 04/19/2021  PCP: [name], MD  Admit date: 04/19/2021 Discharge date: 04/21/2021  Time spent: 35 minutes  Recommendations for Outpatient Follow-up:  Repeat metabolic panel to follow to lites and renal function Reassess blood pressure and adjust antihypertensive treatment as needed Patient to follow-up with cardiology service for further evaluation and medication management of CAD.   Discharge Diagnoses:  Chest pain Hyperlipidemia ACS (acute coronary syndrome) ([place]) Primary hypertension Depression with anxiety Gastroesophageal flux disease Chronic pain syndrome  Discharge Condition: Stable and improved.  Discharged home with instruction to follow-up with PCP and cardiology as an outpatient.  CODE STATUS: Full code.  Diet recommendation: Heart healthy diet  Filed Weights   04/19/21 0228 04/20/21 0500 04/21/21 0614  Weight: 55.3 kg 55.4 kg 55.6 kg    History of present illness:  Zachary Chambers is a 70 y.o. male with medical history significant of chronic respiratory failure due to COPD (using 2-3 L nasal cannula supplementation at baseline), gastroesophageal flux disease, hyperlipidemia, history of paroxysmal atrial fibrillation disease chronically on Eliquis) and myocardial infarction; who presented to the hospital secondary to chest pain and shortness of breath.  Patient reports symptoms started while watching TV at nighttime, localized in the middle of his chest and radiated to both of his shoulders.  There was associated shortness of breath and per wife reports diaphoresis while the pain was present.  Patient received aspirin and nitroglycerin with some improvement in his discomfort in the way to the emergency department. Patient denies nausea, vomiting, dysuria, hematuria, melena, hematochezia, fever, focal weakness or any other complaints.   COVID PCR is negative in the ED.   Of note, patient  presented with acute coronary syndrome about 1 month ago found not to be eligible for invasive ischemic work-up and was decided to focus on medical management.   Hospital Course:  1-chest pain/ACS/unstable angina. -Has completed satisfactorily without any events or complication, 48 hours of heparin infusion as recommended by cardiology service -Continue metoprolol and Imdur at time of discharge; so far well-tolerated and with a stable blood pressure. -Patient is chest pain-free currently. -Continue aspirin and statins -Continue as needed nitroglycerin. -Outpatient follow-up with cardiology service.   2-paroxysmal atrial fibrillation -Sinus rhythm and rate controlled currently -Continue metoprolol -Resume Eliquis for secondary prevention.   3-chronic respiratory failure with hypoxia secondary to COPD -No wheezing on exam; good oxygen saturation on chronic supplementation. -Patient expressed feeling slightly short of breath with activity; but speaking in full sentences and in no distress. -Continue Pulmicort and as needed bronchodilator -Continue 2 L nasal cannula supplementation.   4-hyperlipidemia -Continue statins. -Heart healthy diet has been encouraged.   5-chronic pain syndrome/anxiety -Continue home analgesics and anxiolytic medications. -Overall mood is a stable.   6-gastroesophageal reflux disease -Continue PPI.  Procedures: See below for x-ray   Consultations: Cardiology service curbside (Dr. Curt Bears)  Discharge Exam: Vitals:   04/21/21 0737 04/21/21 1341  BP:  131/72  Pulse:  65  Resp:  16  Temp:  98.1 F (36.7 C)  SpO2: 97% 96%    General: No chest pain, no nausea, no vomiting.  Reports breathing is at baseline. Cardiovascular: S1 and S2, no rubs, no gallops, no JVD on exam. Respiratory: Good air movement bilaterally, no wheezing, no crackles. Abdomen: Soft, nontender, nondistended, positive bowel sounds Extremities: No cyanosis or  clubbing.  Discharge Instructions   Discharge Instructions     Diet - low sodium heart  healthy   Complete by: As directed    Discharge instructions   Complete by: As directed    Take medications as prescribed Follow heart healthy diet and watch amount of sodium in your diet Maintain adequate hydration Arrange follow-up with PCP in 10 days Follow-up with cardiology service as instructed (office will contact you with appointment details).   Discharge wound care:   Complete by: As directed    Keep area clean and dry   Increase activity slowly   Complete by: As directed       Allergies as of 04/21/2021   No Known Allergies      Medication List     TAKE these medications    albuterol (2.5 MG/3ML) 0.083% nebulizer solution Commonly known as: PROVENTIL Take 3 mLs (2.5 mg total) by nebulization every 4 (four) hours as needed for wheezing or shortness of breath. What changed: Another medication with the same name was changed. Make sure you understand how and when to take each.   albuterol 108 (90 Base) MCG/ACT inhaler Commonly known as: VENTOLIN HFA INHALE 2 PUFFS INTO THE LUNGS EVERY 4 HOURS AS NEEDED FOR WHEEZING AND SHORTNESS OF BREATH What changed: See the new instructions.   apixaban 5 MG Tabs tablet Commonly known as: ELIQUIS Take 1 tablet (5 mg total) by mouth 2 (two) times daily.   aspirin 81 MG tablet Take 81 mg by mouth at bedtime.   atorvastatin 40 MG tablet Commonly known as: LIPITOR Take 1 tablet (40 mg total) by mouth at bedtime.   Breztri Aerosphere 160-9-4.8 MCG/ACT Aero Generic drug: Budeson-Glycopyrrol-Formoterol Inhale 2 puffs into the lungs 2 (two) times daily.   D3 PO Take 1 tablet by mouth daily.   fluticasone 50 MCG/ACT nasal spray Commonly known as: FLONASE USE 2 SPRAYS in EACH NOSTRIL EVERY DAY   folic acid 1 MG tablet Commonly known as: FOLVITE TAKE 1 TABLET BY MOUTH EVERY DAY Please make appointment WITH new pcp   isosorbide  mononitrate 30 MG 24 hr tablet Commonly known as: IMDUR Take 0.5 tablets (15 mg total) by mouth daily. Start taking on: April 22, 2021   LORazepam 0.5 MG tablet Commonly known as: ATIVAN Take 1 tablet (0.5 mg total) by mouth 2 (two) times daily as needed for anxiety.   magnesium oxide 400 (240 Mg) MG tablet Commonly known as: MAG-OX Take 1 tablet (400 mg total) by mouth daily.   metoprolol tartrate 25 MG tablet Commonly known as: LOPRESSOR Take 1 tablet (25 mg total) by mouth 2 (two) times daily.   multivitamin with minerals Tabs tablet Take 1 tablet by mouth daily.   nitroGLYCERIN 0.4 MG SL tablet Commonly known as: Nitrostat dissolve 1 TABLET UNDER THE TONGUE every 5 minutes AS NEEDED CHEST PAIN   omeprazole 40 MG capsule Commonly known as: PRILOSEC Take 1 capsule (40 mg total) by mouth daily.   QUEtiapine 25 MG tablet Commonly known as: SEROQUEL Take 1 tablet (25 mg total) by mouth at bedtime.   thiamine 100 MG tablet Take 5 tablets (500 mg total) by mouth daily.   traMADol 50 MG tablet Commonly known as: ULTRAM TAKE 1 TABLET BY MOUTH EVERY 6 HOURS AS NEEDED FOR moderate PAIN   VITAMIN B-12 PO Take 1 tablet by mouth daily.   zinc gluconate 50 MG tablet Take 50 mg by mouth daily.               Discharge Care Instructions  (From admission, onward)  Start     Ordered   04/21/21 0000  Discharge wound care:       Comments: Keep area clean and dry   04/21/21 1456           No Known Allergies  Follow-up Information     [name], MD. Schedule an appointment as soon as possible for a visit in 10 day(s).   Specialty: Family Medicine Contact information: [address] [place] [address] [phone]         Josue Hector, MD Follow up.   Specialty: Cardiology Why: contact office for appointment details in 2 weeks Contact information: [id number] [place]  [phone]                  The results of significant diagnostics from this hospitalization (including imaging, microbiology, ancillary and laboratory) are listed below for reference.    Significant Diagnostic Studies: DG Chest Portable 1 View  Result Date: 04/19/2021 CLINICAL DATA:  Chest pain EXAM: PORTABLE CHEST 1 VIEW COMPARISON:  02/09/2021 FINDINGS: There is hyperinflation of the lungs compatible with COPD. Bibasilar scarring. No acute confluent opacities or effusions. Heart is normal size. IMPRESSION: COPD/chronic changes.  No active disease. Electronically Signed   By: Rolm Baptise M.D.   On: 04/19/2021 03:03    Microbiology: Recent Results (from the past 240 hour(s))  Resp Panel by RT-PCR (Flu A&B, Covid) Nasopharyngeal Swab     Status: None   Collection Time: 04/19/21  3:58 AM   Specimen: Nasopharyngeal Swab; Nasopharyngeal(NP) swabs in vial transport medium  Result Value Ref Range Status   SARS Coronavirus 2 by RT PCR NEGATIVE NEGATIVE Final    Comment: (NOTE) SARS-CoV-2 target nucleic acids are NOT DETECTED.  The SARS-CoV-2 RNA is generally detectable in upper respiratory specimens during the acute phase of infection. The lowest concentration of SARS-CoV-2 viral copies this assay can detect is 138 copies/mL. A negative result does not preclude SARS-Cov-2 infection and should not be used as the sole basis for treatment or other patient management decisions. A negative result may occur with  improper specimen collection/handling, submission of specimen other than nasopharyngeal swab, presence of viral mutation(s) within the areas targeted by this assay, and inadequate number of viral copies(<138 copies/mL). A negative result must be combined with clinical observations, patient history, and epidemiological information. The expected result is Negative.  Fact Sheet for Patients:  EntrepreneurPulse.com.au  Fact Sheet for Healthcare  Providers:  IncredibleEmployment.be  This test is no t yet approved or cleared by the [place] FDA and  has been authorized for detection and/or diagnosis of SARS-CoV-2 by FDA under an Emergency Use Authorization (EUA). This EUA will remain  in effect (meaning this test can be used) for the duration of the COVID-19 declaration under Section 564(b)(1) of the Act, 21 U.S.C.section 360bbb-3(b)(1), unless the authorization is terminated  or revoked sooner.       Influenza A by PCR NEGATIVE NEGATIVE Final   Influenza B by PCR NEGATIVE NEGATIVE Final    Comment: (NOTE) The Xpert Xpress SARS-CoV-2/FLU/RSV plus assay is intended as an aid in the diagnosis of influenza from Nasopharyngeal swab specimens and should not be used as a sole basis for treatment. Nasal washings and aspirates are unacceptable for Xpert Xpress SARS-CoV-2/FLU/RSV testing.  Fact Sheet for Patients: EntrepreneurPulse.com.au  Fact Sheet for Healthcare Providers: IncredibleEmployment.be  This test is not yet approved or cleared by the [place] and  has been authorized for detection and/or diagnosis of SARS-CoV-2 by FDA under an Emergency Use Authorization (EUA). This EUA will remain in effect (meaning this test can be used) for the duration of the COVID-19 declaration under Section 564(b)(1) of the Act, 21 U.S.C. section 360bbb-3(b)(1), unless the authorization is terminated or revoked.  Performed at [facility], [address], [place], [place] [address]      Labs: Basic Metabolic Panel: Recent Labs  Lab 04/19/21 0226 04/19/21 0441 04/19/21 0939 04/20/21 0319 04/21/21 0543  NA 134*  --   --  134* 136  K 3.4*  --   --  4.1 3.9  CL 99  --   --  99 95*  CO2 29  --   --  29 32  GLUCOSE 133*  --   --  97 97  BUN 14  --   --  12 11  CREATININE 0.77  --   --  0.78 0.75  CALCIUM 8.1*  --   --  8.6* 9.4  MG  --  1.5* 2.3  --   --   PHOS  --   3.2  --   --   --    Liver Function Tests: Recent Labs  Lab 04/19/21 0233  AST 27  ALT 16  ALKPHOS 110  BILITOT 0.4  PROT 6.1*  ALBUMIN 3.3*   Recent Labs  Lab 04/19/21 0233  LIPASE 47   CBC: Recent Labs  Lab 04/19/21 0226 04/20/21 0319 04/21/21 0543  WBC 9.2 7.4 7.6  HGB 14.0 13.3 14.1  HCT 44.6 42.1 43.9  MCV 84.6 84.7 83.0  PLT 298 384 447*   BNP (last 3 results) Recent Labs    03/11/21 2050  BNP 466.0*    Signed:  Barton Dubois MD.  [facility] 04/21/2021, 3:00 PM

## 2021-04-21 NOTE — Progress Notes (Signed)
Called [name] which is pt's wife to inform of discharge orders. Wife says she will be here in a hour or two. Awaiting arrival

## 2021-04-21 NOTE — Progress Notes (Signed)
Initial Nutrition Assessment  DOCUMENTATION CODES:      INTERVENTION:  If MD agrees liberalize diet to Regular   Ensure Enlive po BID, each supplement provides 350 kcal and 20 grams of protein   NUTRITION DIAGNOSIS:   Inadequate oral intake related to chronic illness (COPD) as evidenced by mild fat depletion, mild muscle depletion (BMI-18.64), patient reported intake.   GOAL:  Patient will meet greater than or equal to 90% of their needs   MONITOR:   Po intake (meals and ONS), labs and wt trends   REASON FOR ASSESSMENT:   Malnutrition Screening Tool    ASSESSMENT: Patient is a 70 yo male with hx of PUD, GERD, COPD, ETOH and tobacco abuse. He presents with chest pain.   Patient ate 50-100% of 4 meals. Independent with feeding. He complains that the hospital food has no flavor (salt) and that he was eating better at home.   Patient says he is leaving today. Talked with nursing who reports pt is not ready for discharge.  Medications reviewed and include:  Folvite, MVI, Prootnix, Lipitor, Mag-ox, Thiamine, Zinc.  Patient weights have ranged between 55-59 kg since January. No significant change noted. Under desirable weight/BMI for age.   Labs: BMP Latest Ref Rng & Units 04/21/2021 04/20/2021 04/19/2021  Glucose 70 - 99 mg/dL 97 97 133(H)  BUN 8 - 23 mg/dL '11 12 14  '$ Creatinine 0.61 - 1.24 mg/dL 0.75 0.78 0.77  BUN/Creat Ratio 6 - 22 (calc) - - -  Sodium 135 - 145 mmol/L 136 134(L) 134(L)  Potassium 3.5 - 5.1 mmol/L 3.9 4.1 3.4(L)  Chloride 98 - 111 mmol/L 95(L) 99 99  CO2 22 - 32 mmol/L 32 29 29  Calcium 8.9 - 10.3 mg/dL 9.4 8.6(L) 8.1(L)      NUTRITION - FOCUSED PHYSICAL EXAM:  Nutrition-Focused physical exam completed. Findings are mild upper arm, thoracic fat depletion, mild clavicle, dorsal, patellar muscle depletion, and no edema.    Diet Order:   Diet Order             Diet - low sodium heart healthy           Diet Heart Room service appropriate? Yes; Fluid  consistency: Thin  Diet effective now                   EDUCATION NEEDS:  Education needs have been addressed  Skin:  Skin Assessment: Reviewed RN Assessment. Skin tear on his back.  Last BM:  9/3  Height:   Ht Readings from Last 1 Encounters:  04/21/21 '5\' 8"'$  (1.727 m)    Weight:   Wt Readings from Last 1 Encounters:  04/21/21 55.6 kg    Ideal Body Weight:   64 kg   BMI:  Body mass index is 18.64 kg/m.  Estimated Nutritional Needs:   Kcal:  1800-2000  Protein:  78-90  Fluid:  >1600 ml daily  Colman Cater MS,RD,CSG,LDN Contact: Shea Evans

## 2021-04-21 NOTE — Progress Notes (Signed)
Zachary Chambers for heparin gtt  Indication: chest pain/ACS  No Known Allergies  Patient Measurements: Height: '5\' 8"'$  (172.7 cm) Weight: 55.6 kg (122 lb 9.2 oz) IBW/kg (Calculated) : 68.4 Heparin Dosing Weight: HEPARIN DW (KG): 55.6   Vital Signs: Temp: 98.1 F (36.7 C) (09/05 1341) Temp Source: Oral (09/05 1341) BP: 131/72 (09/05 1341) Pulse Rate: 65 (09/05 1341)  Labs: Recent Labs    04/19/21 0226 04/19/21 1627 04/20/21 0319 04/20/21 1144 04/20/21 1923 04/21/21 0543 04/21/21 1607  HGB 14.0  --  13.3  --   --  14.1  --   HCT 44.6  --  42.1  --   --  43.9  --   PLT 298  --  384  --   --  447*  --   APTT  --    < > 51* 56* 58* 106*  --   HEPARINUNFRC  --    < > 0.90*  --  0.82* >1.10* 0.92*  CREATININE 0.77  --  0.78  --   --  0.75  --    < > = values in this interval not displayed.     Estimated Creatinine Clearance: 66.6 mL/min (by C-G formula based on SCr of 0.75 mg/dL).   Assessment: Zachary Chambers a 70 y.o. male requires anticoagulation with a heparin iv infusion for the indication of  chest pain/ACS. Heparin gtt will be started following pharmacy protocol per pharmacy consult. Patient is on previous oral anticoagulant, apixaban, that will require aPTT/HL correlation before transitioning to only HL monitoring.   aPTT 110sec (slightly supratherapeutic) on gtt at 1300 units/hr. No issues with line or bleeding reported per RN.  Goal of Therapy:  Heparin level 0.3-0.7 units/ml aPTT 66-102 seconds Monitor platelets by anticoagulation protocol: Yes   Plan:  Decrease heparin infusion to 1200 units/hr F/u AM heparin level/aPTT  Zachary Chambers, PharmD, MBA, BCGP Clinical Pharmacist  04/21/2021,5:27 PM

## 2021-04-21 NOTE — Progress Notes (Deleted)
NEUROLOGY FOLLOW UP OFFICE NOTE  Zachary Chambers [id number]  Assessment/Plan:    1. Left frontoparietal infarcts, presumably cardioembolic off anticoagulation 2.  Acute encephalopathy secondary to stroke 3.  Paroxysmal atrial fibrillation 4.  Hyperlipidemia 5.  Coronary artery disease  Subjective:  Zachary Chambers is a 70 year old right-handed male with COPD, CAD s/p MI and HLD who follows up for stroke and recent hospitalization.  He is accompanied by his significant other who also provides collateral history.     UPDATE: Current medications:  ***  CTA of head on 10/04/2020 personally reviewed showed no large or medium intracranial vessel occlusion or significant stenosis.   He was admitted to the hospital on 03/11/2021 for chest pain and hallucinations.  He was confused, seeing things and having conversations with people who were not there.  He had mildly low potassium of 3.3, magnesium of 1.6 and phosphate of 2.4 which were all replenished.  CT head showed no acute findings but MRI of brain showed small subacute infarcts in the left frontoparietal lobes.  He had not been taking his Eliquis due to financial restraints.  Echo showed EF 65-70%.    HISTORY: H was admitted to [facility] in November 2021 for acute respiratory failure secondary to COVID pneumonia.  D-dimer was 4.02 which trended down to 0.79 by discharge.  He had transient atrial fibrillation noted on telemetry believed to be secondary to acute illness.  Since hospitalization, he has had right sided weakness.  He reports difficulty lifting his right arm and leg.  He followed up with his PCP.  MRI of brain was performed on 08/29/2020, which showed a 1.5 cm acute to early subacute infarct affecting the cortical and subcortical left vertex frontoparietal junction.  Moderate to severe chronic small vessel ischemic changes also noted.  Given that he had a fib in the hospital, his PCP started him on Eliquis.  He takes atorvastatin  '40mg'$  daily for hyperlipidemia.  LDL from July 2021 was 61.  Hgb A1c at that time was 5.4.  2D echocardiogram from July 2021 showed EF 55-60% with no cardiac source of emboli.  Since discharge from the hospital, he reports recurrent bowel incontinence.   PAST MEDICAL HISTORY: Past Medical History:  Diagnosis Date   Alcohol abuse    Aortic atherosclerosis (HCC)    CAD in native artery    Chronic respiratory failure (HCC)    COPD (chronic obstructive pulmonary disease) (HCC)    DJD (degenerative joint disease)    diffusely   GERD (gastroesophageal reflux disease)    Hyperlipidemia    On home O2    Paroxysmal atrial fibrillation (HCC)    Pericardial effusion    Pneumonia due to COVID-19 virus    PUD (peptic ulcer disease)    with bleeding   Rotator cuff disorder    has been evaluated by Dr Clifton James and [facility]   Stroke (cerebrum) [facility])     MEDICATIONS: Current Facility-Administered Medications on File Prior to Visit  Medication Dose Route Frequency Provider Last Rate Last Admin   0.9 %  sodium chloride infusion  250 mL Intravenous PRN Barton Dubois, MD       acetaminophen (TYLENOL) tablet 650 mg  650 mg Oral Q4H PRN Barton Dubois, MD       albuterol (PROVENTIL) (2.5 MG/3ML) 0.083% nebulizer solution 2.5 mg  2.5 mg Nebulization Q4H PRN Barton Dubois, MD       aspirin EC tablet 81 mg  81 mg  Oral Daily Barton Dubois, MD   81 mg at 04/21/21 [id number]   atorvastatin (LIPITOR) tablet 40 mg  40 mg Oral QHS Barton Dubois, MD   40 mg at 04/20/21 2100   budesonide (PULMICORT) nebulizer solution 0.5 mg  0.5 mg Nebulization BID Barton Dubois, MD   0.5 mg at 04/21/21 0736   fluticasone (FLONASE) 50 MCG/ACT nasal spray 2 spray  2 spray Each Nare Daily Barton Dubois, MD   2 spray at [id number]   folic acid (FOLVITE) tablet 1 mg  1 mg Oral Daily Barton Dubois, MD   1 mg at 04/21/21 0807   heparin ADULT infusion 100 units/mL (25000 units/276m)  1,300 Units/hr Intravenous Continuous MBarton Dubois MD 13 mL/hr at 04/21/21 1234 1,300 Units/hr at 04/21/21 1234   isosorbide mononitrate (IMDUR) 24 hr tablet 15 mg  15 mg Oral Daily MBarton Dubois MD   15 mg at 04/21/21 0806   LORazepam (ATIVAN) tablet 0.5 mg  0.5 mg Oral Q8H PRN MBarton Dubois MD       magnesium oxide (MAG-OX) tablet 400 mg  400 mg Oral Daily MBarton Dubois MD   400 mg at 04/21/21 [id number]  metoprolol tartrate (LOPRESSOR) tablet 25 mg  25 mg Oral BID MBarton Dubois MD   25 mg at 04/21/21 [id number]  morphine 2 MG/ML injection 2 mg  2 mg Intravenous Q4H PRN MBarton Dubois MD       multivitamin with minerals tablet 1 tablet  1 tablet Oral Daily MBarton Dubois MD   1 tablet at 04/21/21 [id number]  nitroGLYCERIN (NITROSTAT) SL tablet 0.4 mg  0.4 mg Sublingual Q5 Min x 3 PRN MBarton Dubois MD       ondansetron ([facility] tablet 4 mg  4 mg Oral Q6H PRN OReubin Milan MD       Or   ondansetron ([facility] injection 4 mg  4 mg Intravenous Q6H PRN OReubin Milan MD       pantoprazole (PROTONIX) EC tablet 40 mg  40 mg Oral Daily MBarton Dubois MD   40 mg at 04/21/21 0806   QUEtiapine (SEROQUEL) tablet 25 mg  25 mg Oral QHS MBarton Dubois MD   25 mg at 04/20/21 2100   sodium chloride flush (NS) 0.9 % injection 3 mL  3 mL Intravenous Q12H MBarton Dubois MD   3 mL at 04/20/21 0913   sodium chloride flush (NS) 0.9 % injection 3 mL  3 mL Intravenous PRN MBarton Dubois MD       thiamine tablet 100 mg  100 mg Oral Daily MBarton Dubois MD   100 mg at 04/21/21 [id number]  traMADol (ULTRAM) tablet 50 mg  50 mg Oral Q6H PRN MBarton Dubois MD       zinc sulfate capsule 220 mg  220 mg Oral Daily MBarton Dubois MD   220 mg at 04/21/21 [id number]  Current Outpatient Medications on File Prior to Visit  Medication Sig Dispense Refill   albuterol (PROVENTIL) (2.5 MG/3ML) 0.083% nebulizer solution Take 3 mLs (2.5 mg total) by nebulization every 4 (four) hours as needed for wheezing or shortness of breath. 180 mL 4   albuterol (VENTOLIN HFA) 108 (90  Base) MCG/ACT inhaler INHALE 2 PUFFS INTO THE LUNGS EVERY 4 HOURS AS NEEDED FOR WHEEZING AND SHORTNESS OF BREATH (Patient taking differently: Inhale 2 puffs into the lungs every 4 (four) hours as needed for shortness of breath.) 18 g 0   apixaban (ELIQUIS) 5 MG TABS tablet  Take 1 tablet (5 mg total) by mouth 2 (two) times daily. 60 tablet 3   aspirin 81 MG tablet Take 81 mg by mouth at bedtime.     atorvastatin (LIPITOR) 40 MG tablet Take 1 tablet (40 mg total) by mouth at bedtime. 90 tablet 3   Budeson-Glycopyrrol-Formoterol (BREZTRI AEROSPHERE) 160-9-4.8 MCG/ACT AERO Inhale 2 puffs into the lungs 2 (two) times daily. 17.7 g 3   Cholecalciferol (D3 PO) Take 1 tablet by mouth daily.     Cyanocobalamin (VITAMIN B-12 PO) Take 1 tablet by mouth daily.     fluticasone (FLONASE) 50 MCG/ACT nasal spray USE 2 SPRAYS in EACH NOSTRIL EVERY DAY 16 g 6   folic acid (FOLVITE) 1 MG tablet TAKE 1 TABLET BY MOUTH EVERY DAY Please make appointment WITH new pcp 90 tablet 3   LORazepam (ATIVAN) 0.5 MG tablet Take 1 tablet (0.5 mg total) by mouth 2 (two) times daily as needed for anxiety. 180 tablet 0   magnesium oxide (MAG-OX) 400 (240 Mg) MG tablet Take 1 tablet (400 mg total) by mouth daily. 100 tablet 0   metoprolol tartrate (LOPRESSOR) 25 MG tablet Take 1 tablet (25 mg total) by mouth 2 (two) times daily. 180 tablet 3   Multiple Vitamin (MULTIVITAMIN WITH MINERALS) TABS tablet Take 1 tablet by mouth daily. 90 tablet 0   nitroGLYCERIN (NITROSTAT) 0.4 MG SL tablet dissolve 1 TABLET UNDER THE TONGUE every 5 minutes AS NEEDED CHEST PAIN 25 tablet 0   omeprazole (PRILOSEC) 40 MG capsule Take 1 capsule (40 mg total) by mouth daily. 90 capsule 3   QUEtiapine (SEROQUEL) 25 MG tablet Take 1 tablet (25 mg total) by mouth at bedtime. 30 tablet 0   thiamine 100 MG tablet Take 5 tablets (500 mg total) by mouth daily. (Patient not taking: No sig reported) 100 tablet 0   traMADol (ULTRAM) 50 MG tablet TAKE 1 TABLET BY MOUTH  EVERY 6 HOURS AS NEEDED FOR moderate PAIN 30 tablet 0   zinc gluconate 50 MG tablet Take 50 mg by mouth daily.      ALLERGIES: No Known Allergies  FAMILY HISTORY: Family History  Problem Relation Age of Onset   Ovarian cancer Mother    Diabetes Maternal Grandfather    COPD Father    Diabetes Paternal Grandfather    Colon cancer Paternal Grandfather    Esophageal cancer Neg Hx    Rectal cancer Neg Hx    Stomach cancer Neg Hx       Objective:  *** General: No acute distress.  Patient appears well-groomed.   Head:  Normocephalic/atraumatic Eyes:  Fundi examined but not visualized Neck: supple, no paraspinal tenderness, full range of motion Heart:  Regular rate and rhythm Lungs:  Clear to auscultation bilaterally Back: No paraspinal tenderness Neurological Exam: alert and oriented to person, place, and time.  Speech fluent and not dysarthric, language intact.  CN II-XII intact. Bulk and tone normal, muscle strength 5/5 throughout.  Sensation to light touch intact.  Deep tendon reflexes 2+ throughout, toes downgoing.  Finger to nose testing intact.  Gait normal, Romberg negative.   Metta Clines, DO  CC: [name], MD

## 2021-04-21 NOTE — Progress Notes (Signed)
[place] for heparin gtt  Indication: chest pain/ACS  No Known Allergies  Patient Measurements: Height: '5\' 8"'$  (172.7 cm) Weight: 55.6 kg (122 lb 9.2 oz) IBW/kg (Calculated) : 68.4 Heparin Dosing Weight: HEPARIN DW (KG): 55.6   Vital Signs: Temp: 97.9 F (36.6 C) (09/05 0529) Temp Source: Oral (09/05 0529) BP: 121/88 (09/05 0529) Pulse Rate: 68 (09/05 0529)  Labs: Recent Labs    04/19/21 0226 04/19/21 1627 04/20/21 0319 04/20/21 1144 04/20/21 1923 04/21/21 0543  HGB 14.0  --  13.3  --   --  14.1  HCT 44.6  --  42.1  --   --  43.9  PLT 298  --  384  --   --  447*  APTT  --    < > 51* 56* 58* 106*  HEPARINUNFRC  --    < > 0.90*  --  0.82* >1.10*  CREATININE 0.77  --  0.78  --   --  0.75   < > = values in this interval not displayed.     Estimated Creatinine Clearance: 66.6 mL/min (by C-G formula based on SCr of 0.75 mg/dL).   Assessment: Zachary Chambers a 70 y.o. male requires anticoagulation with a heparin iv infusion for the indication of  chest pain/ACS. Heparin gtt will be started following pharmacy protocol per pharmacy consult. Patient is on previous oral anticoagulant, apixaban, that will require aPTT/HL correlation before transitioning to only HL monitoring.   aPTT 106sec (slightly supratherapeutic) on gtt at 1400 units/hr. No issues with line or bleeding reported per RN.  Goal of Therapy:  Heparin level 0.3-0.7 units/ml aPTT 66-102 seconds Monitor platelets by anticoagulation protocol: Yes   Plan:  Decrease heparin infusion to 1300 units/hr F/u 8 hr heparin level/aPTT  Thomasenia Sales, PharmD, MBA, BCGP Clinical Pharmacist  04/21/2021,7:45 AM

## 2021-04-22 ENCOUNTER — Ambulatory Visit: Payer: Medicare HMO | Admitting: Neurology

## 2021-04-28 ENCOUNTER — Emergency Department (HOSPITAL_BASED_OUTPATIENT_CLINIC_OR_DEPARTMENT_OTHER): Payer: Medicare HMO

## 2021-04-28 ENCOUNTER — Telehealth: Payer: Self-pay | Admitting: *Deleted

## 2021-04-28 ENCOUNTER — Encounter (HOSPITAL_BASED_OUTPATIENT_CLINIC_OR_DEPARTMENT_OTHER): Payer: Self-pay

## 2021-04-28 ENCOUNTER — Other Ambulatory Visit: Payer: Self-pay

## 2021-04-28 ENCOUNTER — Emergency Department (HOSPITAL_BASED_OUTPATIENT_CLINIC_OR_DEPARTMENT_OTHER)
Admission: EM | Admit: 2021-04-28 | Discharge: 2021-04-28 | Disposition: A | Discharge status: Home / Self Care | Payer: Medicare HMO | Attending: Emergency Medicine | Admitting: Emergency Medicine

## 2021-04-28 DIAGNOSIS — I4891 Unspecified atrial fibrillation: Secondary | ICD-10-CM | POA: Insufficient documentation

## 2021-04-28 DIAGNOSIS — Z8616 Personal history of COVID-19: Secondary | ICD-10-CM | POA: Insufficient documentation

## 2021-04-28 DIAGNOSIS — W19XXXA Unspecified fall, initial encounter: Secondary | ICD-10-CM

## 2021-04-28 DIAGNOSIS — I10 Essential (primary) hypertension: Secondary | ICD-10-CM | POA: Insufficient documentation

## 2021-04-28 DIAGNOSIS — Z7901 Long term (current) use of anticoagulants: Secondary | ICD-10-CM | POA: Insufficient documentation

## 2021-04-28 DIAGNOSIS — S20412A Abrasion of left back wall of thorax, initial encounter: Secondary | ICD-10-CM | POA: Insufficient documentation

## 2021-04-28 DIAGNOSIS — S81812A Laceration without foreign body, left lower leg, initial encounter: Secondary | ICD-10-CM | POA: Insufficient documentation

## 2021-04-28 DIAGNOSIS — Z79899 Other long term (current) drug therapy: Secondary | ICD-10-CM | POA: Insufficient documentation

## 2021-04-28 DIAGNOSIS — S81802A Unspecified open wound, left lower leg, initial encounter: Secondary | ICD-10-CM

## 2021-04-28 DIAGNOSIS — Z87891 Personal history of nicotine dependence: Secondary | ICD-10-CM | POA: Diagnosis not present

## 2021-04-28 DIAGNOSIS — I6523 Occlusion and stenosis of bilateral carotid arteries: Secondary | ICD-10-CM | POA: Diagnosis not present

## 2021-04-28 DIAGNOSIS — Z23 Encounter for immunization: Secondary | ICD-10-CM | POA: Insufficient documentation

## 2021-04-28 DIAGNOSIS — S8992XA Unspecified injury of left lower leg, initial encounter: Secondary | ICD-10-CM | POA: Diagnosis present

## 2021-04-28 DIAGNOSIS — J342 Deviated nasal septum: Secondary | ICD-10-CM | POA: Diagnosis not present

## 2021-04-28 DIAGNOSIS — W01198A Fall on same level from slipping, tripping and stumbling with subsequent striking against other object, initial encounter: Secondary | ICD-10-CM | POA: Diagnosis not present

## 2021-04-28 DIAGNOSIS — J449 Chronic obstructive pulmonary disease, unspecified: Secondary | ICD-10-CM | POA: Insufficient documentation

## 2021-04-28 DIAGNOSIS — U071 COVID-19: Secondary | ICD-10-CM | POA: Diagnosis not present

## 2021-04-28 DIAGNOSIS — I2510 Atherosclerotic heart disease of native coronary artery without angina pectoris: Secondary | ICD-10-CM | POA: Insufficient documentation

## 2021-04-28 DIAGNOSIS — Z7982 Long term (current) use of aspirin: Secondary | ICD-10-CM | POA: Insufficient documentation

## 2021-04-28 DIAGNOSIS — Z043 Encounter for examination and observation following other accident: Secondary | ICD-10-CM | POA: Diagnosis not present

## 2021-04-28 DIAGNOSIS — R9431 Abnormal electrocardiogram [ECG] [EKG]: Secondary | ICD-10-CM | POA: Diagnosis not present

## 2021-04-28 DIAGNOSIS — R531 Weakness: Secondary | ICD-10-CM | POA: Diagnosis not present

## 2021-04-28 LAB — CBC WITH DIFFERENTIAL/PLATELET
Abs Immature Granulocytes: 0.1 10*3/uL — ABNORMAL HIGH (ref 0.00–0.07)
Basophils Absolute: 0 10*3/uL (ref 0.0–0.1)
Basophils Relative: 0 %
Eosinophils Absolute: 0.1 10*3/uL (ref 0.0–0.5)
Eosinophils Relative: 1 %
HCT: 38 % — ABNORMAL LOW (ref 39.0–52.0)
Hemoglobin: 12.5 g/dL — ABNORMAL LOW (ref 13.0–17.0)
Immature Granulocytes: 2 %
Lymphocytes Relative: 7 %
Lymphs Abs: 0.5 10*3/uL — ABNORMAL LOW (ref 0.7–4.0)
MCH: 26.5 pg (ref 26.0–34.0)
MCHC: 32.9 g/dL (ref 30.0–36.0)
MCV: 80.7 fL (ref 80.0–100.0)
Monocytes Absolute: 0.8 10*3/uL (ref 0.1–1.0)
Monocytes Relative: 13 %
Neutro Abs: 4.9 10*3/uL (ref 1.7–7.7)
Neutrophils Relative %: 77 %
Platelets: 349 10*3/uL (ref 150–400)
RBC: 4.71 MIL/uL (ref 4.22–5.81)
RDW: 17.2 % — ABNORMAL HIGH (ref 11.5–15.5)
WBC: 6.3 10*3/uL (ref 4.0–10.5)
nRBC: 0 % (ref 0.0–0.2)

## 2021-04-28 LAB — COMPREHENSIVE METABOLIC PANEL
ALT: 20 U/L (ref 0–44)
AST: 45 U/L — ABNORMAL HIGH (ref 15–41)
Albumin: 2.9 g/dL — ABNORMAL LOW (ref 3.5–5.0)
Alkaline Phosphatase: 79 U/L (ref 38–126)
Anion gap: 9 (ref 5–15)
BUN: 10 mg/dL (ref 8–23)
CO2: 30 mmol/L (ref 22–32)
Calcium: 8.5 mg/dL — ABNORMAL LOW (ref 8.9–10.3)
Chloride: 92 mmol/L — ABNORMAL LOW (ref 98–111)
Creatinine, Ser: 0.8 mg/dL (ref 0.61–1.24)
GFR, Estimated: >60 mL/min (ref >60)
Glucose, Bld: 135 mg/dL — ABNORMAL HIGH (ref 70–99)
Potassium: 3.2 mmol/L — ABNORMAL LOW (ref 3.5–5.1)
Sodium: 131 mmol/L — ABNORMAL LOW (ref 135–145)
Total Bilirubin: 0.7 mg/dL (ref 0.3–1.2)
Total Protein: 6 g/dL — ABNORMAL LOW (ref 6.5–8.1)

## 2021-04-28 MED ORDER — POTASSIUM CHLORIDE CRYS ER 20 MEQ PO TBCR
40.0000 meq | EXTENDED_RELEASE_TABLET | Freq: Once | ORAL | Status: AC
  Administered 2021-04-28: 40 meq via ORAL
  Filled 2021-04-28: qty 2

## 2021-04-28 MED ORDER — TETANUS-DIPHTH-ACELL PERTUSSIS 5-2.5-18.5 LF-MCG/0.5 IM SUSY
0.5000 mL | PREFILLED_SYRINGE | Freq: Once | INTRAMUSCULAR | Status: AC
  Administered 2021-04-28: 0.5 mL via INTRAMUSCULAR
  Filled 2021-04-28: qty 0.5

## 2021-04-28 MED ORDER — POTASSIUM CHLORIDE CRYS ER 20 MEQ PO TBCR: 20.0000 meq | EXTENDED_RELEASE_TABLET | Freq: Every day | ORAL | 0 refills | Status: DC

## 2021-04-28 NOTE — Telephone Encounter (Addendum)
Patient SO [name] returned call.   Reports that patient is still having a lot of bleeding around dressings.   Advised to take patient to ER for evaluation.   Verbalized understanding.

## 2021-04-28 NOTE — ED Provider Notes (Signed)
[facility] Provider Note   CSN: [id number] Arrival date & time: 04/28/21  1525     History Chief Complaint  Patient presents with   Laceration    Zachary Chambers is a 70 y.o. male with a history of CAD, COPD on 2 L via nasal cannula at baseline, atrial fibrillation on Eliquis, hyperlipidemia, and prior stroke who presents to the emergency department for evaluation of left lower leg wound that he sustained 2 days prior.  Patient states that he hit his leg against something while moving which resulted in a laceration to the outer leg which has been bleeding fairly steadily but slowly since the injury.  Last night he had a mechanical fall where he tripped and fell reinjuring the leg, he also hit his head at this time but did not have loss of consciousness.  Given the continued bleeding and that he feels generally weak he decided to come to the emergency department today.  He takes Eliquis and aspirin.  He denies syncope, chest pain, shortness of breath, abdominal pain, vomiting, diarrhea, or new numbness/weakness.  He is unsure about his last tetanus vaccine.  He and his wife both tested COVID-positive within the past 1 week.  HPI     Past Medical History:  Diagnosis Date   Alcohol abuse    Aortic atherosclerosis (HCC)    CAD in native artery    Chronic respiratory failure (HCC)    COPD (chronic obstructive pulmonary disease) (HCC)    DJD (degenerative joint disease)    diffusely   GERD (gastroesophageal reflux disease)    Hyperlipidemia    On home O2    Paroxysmal atrial fibrillation (HCC)    Pericardial effusion    Pneumonia due to COVID-19 virus    PUD (peptic ulcer disease)    with bleeding   Rotator cuff disorder    has been evaluated by Dr Clifton James and [facility]   Stroke (cerebrum) [facility])     Patient Active Problem List   Diagnosis Date Noted   Primary hypertension    Depression with anxiety    ACS (acute coronary syndrome) ([place]) [id number]    Metabolic encephalopathy 0000000   Hypokalemia 123456   Acute metabolic encephalopathy 123456   Hypoxemia 12/10/2020   COPD exacerbation ([place]) 12/09/2020   Acute on chronic respiratory failure with hypoxia ([place]) 12/09/2020   Demand ischemia ([place]) 12/09/2020   Anxiety 10/07/2020   History of CVA with residual deficit 10/07/2020   Pneumonia due to COVID-19 virus 07/05/2020   Chronic respiratory failure ([place]) 07/05/2020   Transient atrial fibrillation ([place]) 07/05/2020   Dyspnea 07/04/2020   Protein-calorie malnutrition, severe 03/07/2020   S/P coronary artery stent placement    Chest pain 03/06/2020   Hyperlipidemia 03/06/2020   Healthcare maintenance 03/05/2020   Heavy alcohol use 03/04/2018   Memory changes 03/04/2018   Leukopenia 05/04/2017   Bruit 01/22/2014   Muscle spasm of back 09/20/2013   COPD (chronic obstructive pulmonary disease) ([place]) 01/14/2011   CAD (coronary artery disease) 01/14/2011   GERD (gastroesophageal reflux disease) 01/14/2011   TOBACCO ABUSE 07/25/2009   Osteoarthritis 07/25/2009   ROTATOR CUFF SYNDROME, RIGHT 07/25/2009    Past Surgical History:  Procedure Laterality Date   COLONOSCOPY     EYE SURGERY     right eye removed at age 88   heart stent     x3- 2006       Family History  Problem Relation Age of Onset   Ovarian  cancer Mother    Diabetes Maternal Grandfather    COPD Father    Diabetes Paternal Grandfather    Colon cancer Paternal Grandfather    Esophageal cancer Neg Hx    Rectal cancer Neg Hx    Stomach cancer Neg Hx     Social History   Tobacco Use   Smoking status: Former    Packs/day: 1.00    Years: 40.00    Pack years: 40.00    Types: Cigarettes   Smokeless tobacco: Never   Tobacco comments:    declines patch  Vaping Use   Vaping Use: Never used  Substance Use Topics   Alcohol use: Yes    Alcohol/week: 30.0 standard drinks    Types: 30 Cans of beer per week    Comment: h/o heavy use, now 4 beers  per day   Drug use: Not Currently    Home Medications Prior to Admission medications   Medication Sig Start Date End Date Taking? Authorizing Provider  albuterol (PROVENTIL) (2.5 MG/3ML) 0.083% nebulizer solution Take 3 mLs (2.5 mg total) by nebulization every 4 (four) hours as needed for wheezing or shortness of breath. 01/17/19   Parrett, Fonnie Mu, NP  albuterol (VENTOLIN HFA) 108 (90 Base) MCG/ACT inhaler INHALE 2 PUFFS INTO THE LUNGS EVERY 4 HOURS AS NEEDED FOR WHEEZING AND SHORTNESS OF BREATH Patient taking differently: Inhale 2 puffs into the lungs every 4 (four) hours as needed for shortness of breath. 12/25/20   [name], MD  apixaban (ELIQUIS) 5 MG TABS tablet Take 1 tablet (5 mg total) by mouth 2 (two) times daily. 03/18/21   [name], MD  aspirin 81 MG tablet Take 81 mg by mouth at bedtime.    [provider]  atorvastatin (LIPITOR) 40 MG tablet Take 1 tablet (40 mg total) by mouth at bedtime. 10/14/20   [name], MD  Budeson-Glycopyrrol-Formoterol (BREZTRI AEROSPHERE) 160-9-4.8 MCG/ACT AERO Inhale 2 puffs into the lungs 2 (two) times daily. 04/17/21   [name], MD  Cholecalciferol (D3 PO) Take 1 tablet by mouth daily.    [provider]  Cyanocobalamin (VITAMIN B-12 PO) Take 1 tablet by mouth daily.    [provider]  fluticasone (FLONASE) 50 MCG/ACT nasal spray USE 2 SPRAYS in [facility] NOSTRIL EVERY DAY 04/17/20   Delano, Modena Nunnery, MD  folic acid (FOLVITE) 1 MG tablet TAKE 1 TABLET BY MOUTH EVERY DAY Please make appointment WITH new pcp 03/19/21   [name], MD  isosorbide mononitrate (IMDUR) 30 MG 24 hr tablet Take 0.5 tablets (15 mg total) by mouth daily. 04/22/21 05/22/21  Barton Dubois, MD  LORazepam (ATIVAN) 0.5 MG tablet Take 1 tablet (0.5 mg total) by mouth 2 (two) times daily as needed for anxiety. 10/14/20   [name], MD  magnesium oxide (MAG-OX) 400 (240 Mg) MG tablet Take 1 tablet (400 mg total) by mouth daily.  03/16/21   Pokhrel, Corrie Mckusick, MD  metoprolol tartrate (LOPRESSOR) 25 MG tablet Take 1 tablet (25 mg total) by mouth 2 (two) times daily. 04/17/21   [name], MD  Multiple Vitamin (MULTIVITAMIN WITH MINERALS) TABS tablet Take 1 tablet by mouth daily. 03/09/20   Kayleen Memos, DO  nitroGLYCERIN (NITROSTAT) 0.4 MG SL tablet dissolve 1 TABLET UNDER THE TONGUE every 5 minutes AS NEEDED CHEST PAIN 04/17/21   [name], MD  omeprazole (PRILOSEC) 40 MG capsule Take 1 capsule (40 mg total) by mouth daily. 03/19/21  [name], MD  QUEtiapine (SEROQUEL) 25 MG tablet Take 1 tablet (25 mg total) by mouth at bedtime. 03/16/21   Pokhrel, Corrie Mckusick, MD  thiamine 100 MG tablet Take 5 tablets (500 mg total) by mouth daily. Patient not taking: No sig reported 03/17/21   Pokhrel, Corrie Mckusick, MD  traMADol (ULTRAM) 50 MG tablet TAKE 1 TABLET BY MOUTH EVERY 6 HOURS AS NEEDED FOR moderate PAIN 07/07/19   [name], MD  zinc gluconate 50 MG tablet Take 50 mg by mouth daily.    [provider]    Allergies    Patient has no known allergies.  Review of Systems   Review of Systems  Constitutional:  Positive for fatigue. Negative for chills and fever.  Respiratory:  Negative for shortness of breath.   Cardiovascular:  Negative for chest pain.  Gastrointestinal:  Negative for abdominal pain, diarrhea and vomiting.  Musculoskeletal:  Positive for arthralgias and myalgias.  Skin:  Positive for wound.  Neurological:  Positive for weakness. Negative for seizures, syncope and numbness.  All other systems reviewed and are negative.  Physical Exam Updated Vital Signs BP 125/82 (BP Location: Right Arm)   Pulse 84   Temp 98.7 F (37.1 C) (Oral)   Resp 20   Ht '5\' 8"'$  (1.727 m)   Wt 55.6 kg   SpO2 95%   BMI 18.64 kg/m   Physical Exam Vitals and nursing note reviewed.  Constitutional:      General: He is not in acute distress.    Appearance: He is not toxic-appearing.  HENT:     Head:  Normocephalic.  Cardiovascular:     Rate and Rhythm: Normal rate and regular rhythm.     Comments: 2+ symmetric DP, PT, and radial pulses bilaterally. Pulmonary:     Effort: Pulmonary effort is normal. No respiratory distress.     Breath sounds: No rales.  Chest:     Chest wall: No tenderness.  Abdominal:     General: There is no distension.     Palpations: Abdomen is soft.     Tenderness: There is no abdominal tenderness.  Musculoskeletal:     Cervical back: Normal range of motion and neck supple. No rigidity or tenderness.     Comments: Upper extremities: Actively ranging all major joints.  Diffusely tender to the right glenohumeral joint. Back: No point/focal vertebral tenderness or palpable step-off.  Patient does have a skin abrasion/ulceration to the left mid back with no surrounding erythema, warmth, or purulence.  Lower extremities: Patient has a 5 cm linear skin tear/laceration with a hematoma to the left lateral lower leg.  There is a bloodsoaked bandage on this which nursing staff has been holding pressure on, upon removal of the bandage no significant active bleeding at this time.  Specifically no pulsatile bleeding.  Patient is able to lift bilateral lower extremities off of the bed without difficulty, he has full intact active range of motion of his knees, ankles, and all digits.  He is tender over the laceration of the left lower leg, otherwise nontender.  Compartments are soft.  Skin:    General: Skin is warm and dry.  Neurological:     Mental Status: He is alert.     Comments: Sensation grossly intact bilateral upper and lower extremities.  5 out of 5 symmetric grip strength and strength with plantar dorsiflexion bilaterally.  Psychiatric:        Mood and Affect: Mood normal.    ED Results /  Procedures / Treatments   Labs (all labs ordered are listed, but only abnormal results are displayed) Labs Reviewed  CBC WITH DIFFERENTIAL/PLATELET - Abnormal; Notable for the  following components:      Result Value   Hemoglobin 12.5 (*)    HCT 38.0 (*)    RDW 17.2 (*)    Lymphs Abs 0.5 (*)    Abs Immature Granulocytes 0.10 (*)    All other components within normal limits  COMPREHENSIVE METABOLIC PANEL - Abnormal; Notable for the following components:   Sodium 131 (*)    Potassium 3.2 (*)    Chloride 92 (*)    Glucose, Bld 135 (*)    Calcium 8.5 (*)    Total Protein 6.0 (*)    Albumin 2.9 (*)    AST 45 (*)    All other components within normal limits    EKG None  Radiology DG Shoulder Right  Result Date: 04/28/2021 CLINICAL DATA:  Fall. EXAM: RIGHT SHOULDER - 2+ VIEW COMPARISON:  None. FINDINGS: There is no acute fracture or dislocation identified. There is marked subacromial joint space narrowing which can be seen with chronic rotator cuff tendinopathy. Bones are mildly osteopenic. There are mild degenerative changes of the acromioclavicular joint. IMPRESSION: 1. No acute fracture or dislocation. 2. Marked subacromial joint space narrowing likely related to chronic rotator cuff tendinopathy. Electronically Signed   By: Ronney Asters M.D.   On: 04/28/2021 17:03   DG Tibia/Fibula Left  Result Date: 04/28/2021 CLINICAL DATA:  Fall.  Laceration. EXAM: LEFT TIBIA AND FIBULA - 2 VIEW COMPARISON:  None. FINDINGS: There is soft tissue laceration with overlying bandage of the mid lower extremity. There is no radiopaque foreign body. There are vascular calcifications in the soft tissues. There is no acute fracture or dislocation identified. There are mild degenerative changes of the knee with joint space narrowing and osteophyte formation. IMPRESSION: 1. No acute fracture or dislocation. 2. No foreign body identified. Electronically Signed   By: Ronney Asters M.D.   On: 04/28/2021 17:04   CT HEAD WO CONTRAST (5MM)  Result Date: 04/28/2021 CLINICAL DATA:  Status post fall. EXAM: CT HEAD WITHOUT CONTRAST CT CERVICAL SPINE WITHOUT CONTRAST TECHNIQUE: Multidetector CT  imaging of the head and cervical spine was performed following the standard protocol without intravenous contrast. Multiplanar CT image reconstructions of the cervical spine were also generated. COMPARISON:  CT head 03/11/2021. FINDINGS: CT HEAD FINDINGS BRAIN: BRAIN Patchy and confluent areas of decreased attenuation are noted throughout the deep and periventricular white matter of the cerebral hemispheres bilaterally, compatible with chronic microvascular ischemic disease. No evidence of large-territorial acute infarction. No parenchymal hemorrhage. No mass lesion. No extra-axial collection. No mass effect or midline shift. No hydrocephalus. Basilar cisterns are patent. Vascular: No hyperdense vessel. Atherosclerotic calcifications are present within the cavernous internal carotid arteries. Skull: No acute fracture or focal lesion. Sinuses/Orbits: Mucosal thickening of the left frontal sinus. Mucosal thickening bilateral maxillary sinuses. Otherwise the remaining visualized paranasal sinuses and mastoid air cells are clear. Right orbit prosthesis. Otherwise the left orbit is unremarkable. Other: Deviated rightward nasal septum. CT CERVICAL SPINE FINDINGS Alignment: Normal. Skull base and vertebrae: Multilevel severe degenerative changes of the spine. Moderate to severe osseous neural foraminal stenosis on the left at the C5-C6 level. No severe central canal osseous stenosis. No acute fracture. No aggressive appearing focal osseous lesion or focal pathologic process. Soft tissues and spinal canal: No prevertebral fluid or swelling. No visible canal hematoma. Upper chest: Unremarkable.  Other: None. IMPRESSION: 1. No acute intracranial abnormality. 2. No acute displaced fracture or traumatic listhesis of the cervical spine in a patient with at least moderate to severe osseous neural foraminal stenosis on the left at the C5-C6 level. Electronically Signed   By: Iven Finn M.D.   On: 04/28/2021 16:46   CT  Cervical Spine Wo Contrast  Result Date: 04/28/2021 CLINICAL DATA:  Status post fall. EXAM: CT HEAD WITHOUT CONTRAST CT CERVICAL SPINE WITHOUT CONTRAST TECHNIQUE: Multidetector CT imaging of the head and cervical spine was performed following the standard protocol without intravenous contrast. Multiplanar CT image reconstructions of the cervical spine were also generated. COMPARISON:  CT head 03/11/2021. FINDINGS: CT HEAD FINDINGS BRAIN: BRAIN Patchy and confluent areas of decreased attenuation are noted throughout the deep and periventricular white matter of the cerebral hemispheres bilaterally, compatible with chronic microvascular ischemic disease. No evidence of large-territorial acute infarction. No parenchymal hemorrhage. No mass lesion. No extra-axial collection. No mass effect or midline shift. No hydrocephalus. Basilar cisterns are patent. Vascular: No hyperdense vessel. Atherosclerotic calcifications are present within the cavernous internal carotid arteries. Skull: No acute fracture or focal lesion. Sinuses/Orbits: Mucosal thickening of the left frontal sinus. Mucosal thickening bilateral maxillary sinuses. Otherwise the remaining visualized paranasal sinuses and mastoid air cells are clear. Right orbit prosthesis. Otherwise the left orbit is unremarkable. Other: Deviated rightward nasal septum. CT CERVICAL SPINE FINDINGS Alignment: Normal. Skull base and vertebrae: Multilevel severe degenerative changes of the spine. Moderate to severe osseous neural foraminal stenosis on the left at the C5-C6 level. No severe central canal osseous stenosis. No acute fracture. No aggressive appearing focal osseous lesion or focal pathologic process. Soft tissues and spinal canal: No prevertebral fluid or swelling. No visible canal hematoma. Upper chest: Unremarkable. Other: None. IMPRESSION: 1. No acute intracranial abnormality. 2. No acute displaced fracture or traumatic listhesis of the cervical spine in a patient  with at least moderate to severe osseous neural foraminal stenosis on the left at the C5-C6 level. Electronically Signed   By: Iven Finn M.D.   On: 04/28/2021 16:46    Procedures Procedures   Medications Ordered in ED Medications  Tdap (BOOSTRIX) injection 0.5 mL (0.5 mLs Intramuscular Given 04/28/21 1647)  potassium chloride SA (KLOR-CON) CR tablet 40 mEq (40 mEq Oral Given 04/28/21 1810)    ED Course  I have reviewed the triage vital signs and the nursing notes.  Pertinent labs & imaging results that were available during my care of the patient were reviewed by me and considered in my medical decision making (see chart for details).    MDM Rules/Calculators/A&P                           Patient presents to the ED with complaints of bleeding wound to left lower leg.  No active bleeding on initial assessment by me but this is after nursing staff has applied pressure. Bandages from home and triage are blood soaked. NVI distally.  We will continue to observe the wound in the emergency department.  Plan for imaging based on patient's report of fall as well as basic labs given he has felt weak.  Recently diagnosed with COVID-19 1 week prior.  Additional history obtained:  Additional history obtained from chart review & nursing note review.   Lab Tests:  I Ordered, reviewed, and interpreted labs, which included:  CBC: Hemoglobin 12.5 and hematocrit 38.0 are down trended some from his most  recent labs, most likely from bleeding from wound of the last 2 days. CMP: Multiple mild electrolyte abnormalities as above, potassium ordered in the emergency department, will need recheck.  Imaging Studies ordered:  I ordered imaging studies which included CT head/C-spine and x-rays of the left tibia/fibula and right shoulder, I independently reviewed, formal radiology impression shows:  CT head/Cspine: 1. No acute intracranial abnormality. 2. No acute displaced fracture or traumatic listhesis of  the cervical spine in a patient with at least moderate to severe osseous neural foraminal stenosis on the left at the C5-C6 level. L tib/fib x-ray: 1. No acute fracture or dislocation. 2. No foreign body identified.  R shoulder x-ray: 1. No acute fracture or dislocation. 2. Marked subacromial joint space narrowing likely related to chronic rotator cuff tendinopathy.   ED Course:  Patient with what appears to be venous bleed to the left lower extremity wound/hematoma on reassessment, pinpoint pressure was applied for 10 minutes with subsequent resuming of bleeding.  Supervising physician Dr. Pearline Cables placed a figure-of-eight stitch, observed for an additional period of time without significant rebleed.  Pressure dressing placed.  Tetanus updated.  Neurovascular intact distally.  Imaging without signs of significant injury.  Labs with mild worsening in anemia which is likely secondary to his bleeding from the wound that he has had the past couple of days.  He has multiple mild electrolyte abnormalities, he will need PCP recheck.  He is ambulatory with his assistive device that he uses at home in the emergency department.  He overall appears appropriate for discharge home. I discussed results, treatment plan, need for follow-up, and return precautions with the patient. Provided opportunity for questions, patient confirmed understanding and is in agreement with plan.   This is a shared visit with supervising physician Dr. Pearline Cables who has independently evaluated patient & provided guidance in evaluation/management/disposition, in agreement with care   Portions of this note were generated with Dragon dictation software. Dictation errors may occur despite best attempts at proofreading.  Final Clinical Impression(s) / ED Diagnoses Final diagnoses:  Wound of left lower extremity, initial encounter  Fall, initial encounter    Rx / DC Orders ED Discharge Orders          Ordered    potassium chloride SA (KLOR-CON)  20 MEQ tablet  Daily        04/28/21 [address] [place], PA-C [id number]    Lianne Cure, DO [id number] [id number]

## 2021-04-28 NOTE — Telephone Encounter (Signed)
Received VM from patient SO [name]. [phone]~ telephone.   Reports that she and patient have tested COVID +.  Also reports that patient had fall and struck lower leg. States that patient has multiple contusions on lower leg that are bleeding rather heavily due to Eliquis use. States that she ha applied pressure dressings to wounds, but blood continues to seep out around dressings.   Call placed to patient to advise to go to ER for evaluation of breathing with COVID and wounds to lower extremity.   No answer. No VM.

## 2021-04-28 NOTE — ED Triage Notes (Signed)
Pt acquired laceration to left shin 2 days ago. On Aspirin and eliquis. States been bleeding since. Bleeding controlled after a few minutes of pressure. Tested positive for COVID 4 days ago.

## 2021-04-28 NOTE — ED Notes (Addendum)
Patient transported to CT 

## 2021-04-28 NOTE — Discharge Instructions (Addendum)
You were seen in the emergency department today due to bleeding from the wound to your left lower leg.  Your CT scan of your head and neck did not show any head bleed or fractures.  Your x-rays of your right shoulder and left lower leg did not show any fractures.  He did have some degenerative findings on imaging.  Your blood work showed that your potassium was low, please take the 10 supplement prescribed over the next few days, you also had some mildly low calcium, sodium, and protein levels, please have these rechecked by your primary care provider as well.  Your hemoglobin which looks at your blood counts was a bit lower than it has been in the past this is likely due to the bleeding you had over the past 2 days-please have this rechecked as well.  We placed a small stitch to help with the bleeding in your leg, we have placed a pressure dressing on this area, please continue to place wrap style dressing to help with compression of the wound.  Please follow with your primary care provider for recheck within 3 days.  Return to the emergency department for new or worsening symptoms including but not limited to recurrent bleeding, lightheadedness, dizziness, passing out, chest pain, trouble breathing, fever, redness/swelling around the wound, or any other concerns.

## 2021-05-01 NOTE — Chronic Care Management (AMB) (Signed)
  Care Management   Note  05/01/2021 Name: Zachary Chambers MRN: [id number] DOB: [birth year]  Zachary Chambers is a 70 y.o. year old male who is a primary care patient of Pickard, Cammie Mcgee, MD and is actively engaged with the care management team. I reached out to Janeann Merl by phone today to assist with scheduling a follow up visit with the RN Case Manager  Follow up plan: Telephone appointment with care management team member scheduled for:05/09/2021  Noreene Larsson, [place], [place], [place] [address] Direct Dial: [phone] [email or web address] Website: [email or web address]

## 2021-05-08 ENCOUNTER — Ambulatory Visit (HOSPITAL_BASED_OUTPATIENT_CLINIC_OR_DEPARTMENT_OTHER): Payer: Medicare HMO | Admitting: Family

## 2021-05-09 ENCOUNTER — Telehealth: Payer: Medicare HMO | Admitting: *Deleted

## 2021-05-09 ENCOUNTER — Encounter: Payer: Self-pay | Admitting: *Deleted

## 2021-05-09 NOTE — Telephone Encounter (Signed)
  Care Management   Follow Up Note   05/09/2021 Name: Zachary Chambers MRN: [id number] DOB: [birth year]   Referred by: [name], MD Reason for referral : Error   Third unsuccessful telephone outreach was attempted today. The patient was referred to the case management team for assistance with care management and care coordination. The patient's primary care provider has been notified of our unsuccessful attempts to make or maintain contact with the patient. The care management team is pleased to engage with this patient at any time in the future should he/she be interested in assistance from the care management team.   Follow Up Plan: No further follow up required: if pt does not return phone call, will close case next week.  Jacqlyn Larsen RNC, BSN RN Case Manager [facility] [phone]

## 2021-05-12 DIAGNOSIS — J1282 Pneumonia due to coronavirus disease 2019: Secondary | ICD-10-CM | POA: Diagnosis not present

## 2021-05-12 DIAGNOSIS — U071 COVID-19: Secondary | ICD-10-CM | POA: Diagnosis not present

## 2021-05-12 DIAGNOSIS — R0600 Dyspnea, unspecified: Secondary | ICD-10-CM | POA: Diagnosis not present

## 2021-05-12 DIAGNOSIS — J449 Chronic obstructive pulmonary disease, unspecified: Secondary | ICD-10-CM | POA: Diagnosis not present

## 2021-05-12 DIAGNOSIS — J9621 Acute and chronic respiratory failure with hypoxia: Secondary | ICD-10-CM | POA: Diagnosis not present

## 2021-05-12 DIAGNOSIS — I4891 Unspecified atrial fibrillation: Secondary | ICD-10-CM | POA: Diagnosis not present

## 2021-05-15 DIAGNOSIS — J449 Chronic obstructive pulmonary disease, unspecified: Secondary | ICD-10-CM | POA: Diagnosis not present

## 2021-05-16 ENCOUNTER — Telehealth: Payer: Self-pay | Admitting: *Deleted

## 2021-05-16 ENCOUNTER — Ambulatory Visit: Payer: Self-pay | Admitting: *Deleted

## 2021-05-16 DIAGNOSIS — M6281 Muscle weakness (generalized): Secondary | ICD-10-CM | POA: Diagnosis not present

## 2021-05-16 DIAGNOSIS — J449 Chronic obstructive pulmonary disease, unspecified: Secondary | ICD-10-CM | POA: Diagnosis not present

## 2021-05-16 NOTE — Chronic Care Management (AMB) (Signed)
   05/16/2021  Zachary Chambers 1950-11-12 [id number]  Unable to maintain contact with patient, case closed for RN care management, will leave enrolled due to active with pharmacist.  Jacqlyn Larsen [facility], BSN RN Case Manager [facility] [phone]

## 2021-05-16 NOTE — Telephone Encounter (Signed)
  Care Management   Follow Up Note   05/16/2021 Name: Zachary Chambers MRN: [id number] DOB: 1951-07-31   Referred by: [name], MD Reason for referral : Chronic Care Management (COPD, CAD)  05/09/2021 Third unsuccessful telephone outreach was attempted today. The patient was referred to the case management team for assistance with care management and care coordination. The patient's primary care provider has been notified of our unsuccessful attempts to make or maintain contact with the patient. The care management team is pleased to engage with this patient at any time in the future should he/she be interested in assistance from the care management team.   Follow Up Plan: Case to be closed due to unable to maintain contact.  Jacqlyn Larsen RNC, BSN RN Case Manager [facility] [phone]

## 2021-05-17 DIAGNOSIS — J449 Chronic obstructive pulmonary disease, unspecified: Secondary | ICD-10-CM | POA: Diagnosis not present

## 2021-05-19 NOTE — Progress Notes (Signed)
Chronic Care Management Pharmacy Note  05/22/2021 Name:  Zachary Chambers MRN:  [id number] DOB:  1950-12-02  Subjective: Zachary Chambers is an 70 y.o. year old male who is a primary patient of Pickard, Cammie Mcgee, MD.  The CCM team was consulted for assistance with disease management and care coordination needs.    Engaged with patient by telephone for initial visit in response to provider referral for pharmacy case management and/or care coordination services.   Consent to Services:  The patient was given the following information about Chronic Care Management services today, agreed to services, and gave verbal consent: 1. CCM service includes personalized support from designated clinical staff supervised by the primary care provider, including individualized plan of care and coordination with other care providers 2. 24/7 contact phone numbers for assistance for urgent and routine care needs. 3. Service will only be billed when office clinical staff spend 20 minutes or more in a month to coordinate care. 4. Only one practitioner may furnish and bill the service in a calendar month. 5.The patient may stop CCM services at any time (effective at the end of the month) by phone call to the office staff. 6. The patient will be responsible for cost sharing (co-pay) of up to 20% of the service fee (after annual deductible is met). Patient agreed to services and consent obtained.  Patient Care Team: [name], MD as PCP - General (Family Medicine) Josue Hector, MD as PCP - Cardiology (Cardiology) Edythe Clarity, [facility] as Pharmacist (Pharmacist) Pieter Partridge, DO as Consulting Physician (Neurology)  Recent office visits:  12/26/20 Dr. Dennard Schaumann For COPD/Hospital Follow-up. Per note:The doctor recommended he stop Celebrex due to the potential risk of bleeding given his past medical history and medical comorbidities. 10/07/20 Dr. Buelah Manis For follow-up. STARTED Benzonatate 100 mg 2 times daily PRN. STOPPED  Diazepam and Tiotropium.   Recent consult visits:  12/06/20 Cardiology Josue Hector, MD. For CAD/follow-up. No medication changes. 10/21/20 Pulmonology Magdalen Spatz, NP. For COPD. No medication changes.   09/12/20 Neurology Pieter Partridge, DO. For CVA/follow-up. No medication changes.   Hospital visits:  12/09/20 [facility] ( 3 Days) Darliss Cheney, MD. For COPD Exacerbation. No medication changes.   Medication History: Atorvastatin 40 mg  90 DS 11/17/20   Objective:  Lab Results  Component Value Date   CREATININE 0.80 04/28/2021   BUN 10 04/28/2021   GFR 108.57 01/28/2012   GFRNONAA >60 04/28/2021   GFRAA >60 03/08/2020   NA 131 (L) 04/28/2021   K 3.2 (L) 04/28/2021   CALCIUM 8.5 (L) 04/28/2021   CO2 30 04/28/2021   GLUCOSE 135 (H) 04/28/2021    Lab Results  Component Value Date/Time   HGBA1C 5.4 03/06/2020 02:24 AM   HGBA1C 5.2 09/03/2017 02:55 PM   GFR 108.57 01/28/2012 02:53 PM    Last diabetic Eye exam: No results found for: HMDIABEYEEXA  Last diabetic Foot exam: No results found for: HMDIABFOOTEX   Lab Results  Component Value Date   CHOL 154 03/12/2021   HDL 55 03/12/2021   LDLCALC 92 03/12/2021   TRIG 35 03/12/2021   CHOLHDL 2.8 03/12/2021    Hepatic Function Latest Ref Rng & Units 04/28/2021 04/19/2021 03/14/2021  Total Protein 6.5 - 8.1 g/dL 6.0(L) 6.1(L) 6.4(L)  Albumin 3.5 - 5.0 g/dL 2.9(L) 3.3(L) 3.7  AST 15 - 41 U/L 45(H) 27 56(H)  ALT 0 - 44 U/L 20 16 64(H)  Alk Phosphatase 38 -  126 U/L 79 110 89  Total Bilirubin 0.3 - 1.2 mg/dL 0.7 0.4 1.6(H)  Bilirubin, Direct 0.0 - 0.2 mg/dL - 0.1 -    Lab Results  Component Value Date/Time   TSH 0.904 03/12/2021 05:46 AM   TSH 2.191 03/06/2020 08:19 PM    CBC Latest Ref Rng & Units 04/28/2021 04/21/2021 04/20/2021  WBC 4.0 - 10.5 K/uL 6.3 7.6 7.4  Hemoglobin 13.0 - 17.0 g/dL 12.5(L) 14.1 13.3  Hematocrit 39.0 - 52.0 % 38.0(L) 43.9 42.1  Platelets 150 - 400 K/uL 349 447(H) 384    No results  found for: VD25OH  Clinical ASCVD: Yes  The ASCVD Risk score (Arnett DK, et al., 2019) failed to calculate for the following reasons:   The patient has a prior MI or stroke diagnosis    Depression screen [facility] 2/9 01/17/2021 11/06/2019 07/07/2019  Decreased Interest 0 0 0  Down, Depressed, Hopeless 0 0 0  PHQ - 2 Score 0 0 0  Altered sleeping - - -  Tired, decreased energy - - -  Change in appetite - - -  Feeling bad or failure about yourself  - - -  Trouble concentrating - - -  Moving slowly or fidgety/restless - - -  Suicidal thoughts - - -  PHQ-9 Score - - -  Difficult doing work/chores - - -  Some recent data might be hidden     Social History   Tobacco Use  Smoking Status Former   Packs/day: 1.00   Years: 40.00   Pack years: 40.00   Types: Cigarettes  Smokeless Tobacco Never  Tobacco Comments   declines patch   BP Readings from Last 3 Encounters:  04/28/21 128/85  04/21/21 131/72  03/16/21 128/81   Pulse Readings from Last 3 Encounters:  04/28/21 87  04/21/21 65  03/16/21 75   Wt Readings from Last 3 Encounters:  04/28/21 122 lb 9.2 oz (55.6 kg)  04/21/21 122 lb 9.2 oz (55.6 kg)  03/12/21 122 lb 2.2 oz (55.4 kg)   BMI Readings from Last 3 Encounters:  04/28/21 18.64 kg/m  04/21/21 18.64 kg/m  03/12/21 18.57 kg/m    Assessment/Interventions: Review of patient past medical history, allergies, medications, health status, including review of consultants reports, laboratory and other test data, was performed as part of comprehensive evaluation and provision of chronic care management services.   SDOH:  (Social Determinants of Health) assessments and interventions performed: Yes  Financial Resource Strain: Medium Risk   Difficulty of Paying Living Expenses: Somewhat hard    SDOH Screenings   Alcohol Screen: Not on file  Depression (PHQ2-9): Low Risk    PHQ-2 Score: 0  Financial Resource Strain: Medium Risk   Difficulty of Paying Living Expenses:  Somewhat hard  Food Insecurity: No Food Insecurity   Worried About Charity fundraiser in the Last Year: Never true   Ran Out of Food in the Last Year: Never true  Housing: Not on file  Physical Activity: Inactive   Days of Exercise per Week: 0 days   Minutes of Exercise per Session: 0 min  Social Connections: Not on file  Stress: Not on file  Tobacco Use: Medium Risk   Smoking Tobacco Use: Former   Smokeless Tobacco Use: Never  Transportation Needs: No Data processing manager (Medical): No   Lack of Transportation (Non-Medical): No    CCM Care Plan  No Known Allergies  Medications Reviewed Today     Reviewed by Rosana Hoes,  Jerilynn Mages, [facility] (Pharmacist) on 05/22/21 at 1033  Med List Status: <None>   Medication Order Taking? Sig Documenting Provider Last Dose Status Informant  albuterol (PROVENTIL) (2.5 MG/3ML) 0.083% nebulizer solution [id number] Yes Take 3 mLs (2.5 mg total) by nebulization every 4 (four) hours as needed for wheezing or shortness of breath. Parrett, Fonnie Mu, NP Taking Active Self           Med Note Corky Mull   HQP Jul 06, 2020  8:18 AM)    albuterol (VENTOLIN HFA) 108 (90 Base) MCG/ACT inhaler [id number] Yes INHALE 2 PUFFS INTO THE LUNGS EVERY 4 HOURS AS NEEDED FOR WHEEZING AND SHORTNESS OF BREATH  Patient taking differently: Inhale 2 puffs into the lungs every 4 (four) hours as needed for shortness of breath.   [name], MD Taking Active   apixaban (ELIQUIS) 5 MG TABS tablet [id number] Yes Take 1 tablet (5 mg total) by mouth 2 (two) times daily. [name], MD Taking Active Self  aspirin 81 MG tablet [id number] Yes Take 81 mg by mouth at bedtime. [provider] Taking Active Self  atorvastatin (LIPITOR) 40 MG tablet [id number] Yes Take 1 tablet (40 mg total) by mouth at bedtime. [name], MD Taking Active   Budeson-Glycopyrrol-Formoterol (BREZTRI AEROSPHERE) 160-9-4.8 MCG/ACT Hollie Salk [id number] Yes Inhale 2  puffs into the lungs 2 (two) times daily. [name], MD Taking Active Self  Cholecalciferol (D3 PO) [id number] Yes Take 1 tablet by mouth daily. [provider] Taking Active Self  Cyanocobalamin (VITAMIN B-12 PO) [id number] Yes Take 1 tablet by mouth daily. [provider] Taking Active Self           Med Note Jimmye Norman, DAWN S   Sat Apr 19, 2021  8:05 PM) Pt is not sure about this medication  fluticasone (FLONASE) 50 MCG/ACT nasal spray [id number] Yes Place 2 sprays into both nostrils daily. [name], MD Taking Active   folic acid (FOLVITE) 1 MG tablet [id number] Yes TAKE 1 TABLET BY MOUTH EVERY DAY [name], MD Taking Active   isosorbide mononitrate (IMDUR) 30 MG 24 hr tablet [id number] Yes Take 0.5 tablets (15 mg total) by mouth daily. [name], MD Taking Active   LORazepam (ATIVAN) 0.5 MG tablet [id number] Yes Take 1 tablet (0.5 mg total) by mouth 2 (two) times daily as needed for anxiety. [name], MD Taking Active   magnesium oxide (MAG-OX) 400 (240 Mg) MG tablet [id number] Yes Take 1 tablet (400 mg total) by mouth daily. Pokhrel, Laxman, MD Taking Active Self  metoprolol tartrate (LOPRESSOR) 25 MG tablet [id number] Yes Take 1 tablet (25 mg total) by mouth 2 (two) times daily. [name], MD Taking Active   Multiple Vitamin (MULTIVITAMIN WITH MINERALS) TABS tablet [id number] Yes Take 1 tablet by mouth daily. Kayleen Memos, DO Taking Active Self           Med Note Scotty Court   Wed Mar 12, 2021  8:56 AM) Janalee Dane for men  nitroGLYCERIN (NITROSTAT) 0.4 MG SL tablet [id number] Yes DISSOLVE 1 TABLET UNDER THE TONGUE EVERY 5 MINUTES AS NEEDED FOR CHEST PAIN [name], MD Taking Active   omeprazole (PRILOSEC) 40 MG capsule [id number] Yes Take 1 capsule (40 mg total) by mouth daily. [name], MD Taking Active Self  potassium chloride SA (KLOR-CON) 20 MEQ tablet [id number]  Take 1 tablet (20 mEq total) by mouth daily for 4  days. Petrucelli, Glynda Jaeger,  PA-C  Expired 05/02/21 2359   QUEtiapine (SEROQUEL) 25 MG tablet [id number] Yes Take 1 tablet (25 mg total) by mouth at bedtime. [name], MD Taking Active   thiamine 100 MG tablet [id number] Yes Take 5 tablets (500 mg total) by mouth daily. Pokhrel, Laxman, MD Taking Active Spouse/Significant Other  traMADol (ULTRAM) 50 MG tablet [id number] Yes TAKE 1 TABLET BY MOUTH EVERY 6 HOURS AS NEEDED FOR moderate PAIN [place], Modena Nunnery, MD Taking Active Self           Med Note Fransico Him Jul 04, 2020 12:46 PM)    zinc gluconate 50 MG tablet [id number] Yes Take 50 mg by mouth daily. [provider] Taking Active Self            Patient Active Problem List   Diagnosis Date Noted   Primary hypertension    Depression with anxiety    ACS (acute coronary syndrome) ([place]) 22/29/7989   Metabolic encephalopathy 21/19/4174   Hypokalemia 03/30/4817   Acute metabolic encephalopathy 56/31/4970   Hypoxemia 12/10/2020   COPD exacerbation ([place]) 12/09/2020   Acute on chronic respiratory failure with hypoxia ([place]) 12/09/2020   Demand ischemia ([place]) 12/09/2020   Anxiety 10/07/2020   History of CVA with residual deficit 10/07/2020   Pneumonia due to COVID-19 virus 07/05/2020   Chronic respiratory failure ([place]) 07/05/2020   Transient atrial fibrillation ([place]) 07/05/2020   Dyspnea 07/04/2020   Protein-calorie malnutrition, severe 03/07/2020   S/P coronary artery stent placement    Chest pain 03/06/2020   Hyperlipidemia 03/06/2020   Healthcare maintenance 03/05/2020   Heavy alcohol use 03/04/2018   Memory changes 03/04/2018   Leukopenia 05/04/2017   Bruit 01/22/2014   Muscle spasm of back 09/20/2013   COPD (chronic obstructive pulmonary disease) ([place]) 01/14/2011   CAD (coronary artery disease) 01/14/2011   GERD (gastroesophageal reflux disease) 01/14/2011   TOBACCO ABUSE 07/25/2009   Osteoarthritis 07/25/2009   ROTATOR CUFF SYNDROME, RIGHT  07/25/2009    Immunization History  Administered Date(s) Administered   Fluad Quad(high Dose 65+) 07/07/2019, 08/05/2020   Influenza Whole 07/25/2009   Influenza, High Dose Seasonal PF 09/03/2017, 08/05/2018   Influenza,inj,Quad PF,6+ Mos 06/19/2013, 08/15/2014, 07/17/2015, 07/15/2016   Pneumococcal Conjugate-13 08/15/2014   Pneumococcal Polysaccharide-23 08/26/2016   Tdap 04/28/2021   Zoster Recombinat (Shingrix) 03/07/2018, 06/15/2018    Conditions to be addressed/monitored:  Afib, CAD, COPD, GERD, HLD  Care Plan : General Pharmacy (Adult)  Updates made by Edythe Clarity, RPH since 05/22/2021 12:00 AM     Problem: GERD, COPD, AFib, HLD   Priority: High  Onset Date: 05/22/2021     Long-Range Goal: Patient-Specific Goal   Start Date: 05/22/2021  Expected End Date: 11/20/2021  This Visit's Progress: On track  Priority: High  Note:   Current Barriers:  Unable to independently afford treatment regimen Unable to achieve control of COPD  Unable to self administer medications as prescribed Does not adhere to prescribed medication regimen  Pharmacist Clinical Goal(s):  Patient will verbalize ability to afford treatment regimen achieve control of COPD as evidenced by symptoms adhere to prescribed medication regimen as evidenced by fill dates through collaboration with PharmD and provider.   Interventions: 1:1 collaboration with [name], MD regarding development and update of comprehensive plan of care as evidenced by provider attestation and co-signature Inter-disciplinary care team collaboration (see longitudinal plan of care) Comprehensive medication review performed; medication list updated in electronic medical record  Hyperlipidemia: (LDL goal <  100) -Controlled -Current treatment: Atorvastatin 39m  -Medications previously tried: simvastatin  -Current dietary patterns: patients caretaker admits it is sometimes hard to get hime to eat -Current exercise  habits: minimal to none -Educated on Cholesterol goals;  Benefits of statin for ASCVD risk reduction; Importance of limiting foods high in cholesterol; -Recommended to continue current medication  Update 05/22/21 LDL controlled < 100 at last lab checkup.  Continues to take medication as directed. Continue meds for now.  Transient Atrial Fibrillation (Goal: prevent stroke and major bleeding) -Controlled -Current treatment: Rate control: N/A Anticoagulation: Eliquis 585mbid -Medications previously tried: none noted -Home BP and HR readings: monitors HR at home using pulse oximeter, has BP cuff at home also.  No logs of readings today, informed them to keep an eye on it and reach out if anything was out of range.  -Counseled on increased risk of stroke due to Afib and benefits of anticoagulation for stroke prevention; bleeding risk associated with Eliquis and importance of self-monitoring for signs/symptoms of bleeding; seeking medical attention after a head injury or if there is blood in the urine/stool; -Recommended to continue current medication Assessed patient finances. They are currently having a difficult time paying for Eliquis.  We will start PAP for [facility] as I believe he qualifies based on income.  Reviewed steps to application, patient will return once complete.  COPD (Goal: control symptoms and prevent exacerbations) -Uncontrolled -Current treatment  Breztri Aerosphere 160-9-4.8 mcg 2 puffs bid Albuterol HFA 90 mcg prn  Albuterol HFA 0.083% nebs prn -Medications previously tried: Trelegy, Spiriva, Symbicort Pulmonary Functions Testing Results:  TLC  Date Value Ref Range Status  07/18/2013 5.13 L Final  -Exacerbations requiring treatment in last 6 months: one -Patient reports consistent use of maintenance inhaler, although he reports switching back and for the between his Breztri and Trelegy based on what he has in stock? -Frequency of rescue  inhaler use: daily -Counseled on Proper inhaler technique; Benefits of consistent maintenance inhaler use Differences between maintenance and rescue inhalers -Recommended to continue current medication Assessed patient finances. Will also begin [facility].  Really emphasized adherence twice daily every day as he now will be getting the medication for free. He also needs a new nebulizer machine as his last one broke.  He gets this from [employer], will contact nurse to see about ordering a new one.  Update 05/22/21 Patient got a new nebulizer machine, he has not been approved [facility] assistance.  I could not locate applications in the office.  Unclear if patient ever returned them.  He has tried these in the past with no luck so he is discouraged on the application process.  Encouraged him to complete and I will fax them in to manufacturer.  GERD (Goal: Control symptoms) -Not ideally controlled -Current treatment  Omeprazole 2094maily -Medications previously tried: none noted -Noted some symptoms of increasing acid reflux/heartburn lately.  He was taking this medication at lunch time with a few other medications.  -Recommended to continue current medication Recommended he try to take medication first thing in the morning at least 30 to 60 minutes before food or drink so that it has time to work as directed.  Anxiety (Goal: Minimize symptoms) -Controlled -Current treatment: Lorazepam 0.5mg54mlf tablet bid prn -Medications previously tried/failed: Wellbutrin, hydroxyzine -PHQ9:  PHQ9 SCORE ONLY 01/17/2021 11/06/2019 07/07/2019  PHQ-9 Total Score 0 0 0   -GAD7: No flowsheet data found. -Patient using for sleep,  reports sleep is off and on.  Sometimes does not go to bed until 5 to 6 am, caregiver stays up with him. -Educated on Benefits of medication for symptom control -Recommended to continue current medication  Patient Goals/Self-Care Activities Patient  will:  - take medications as prescribed focus on medication adherence by fill date/pill count check blood pressure when symptomatic, document, and provide at future appointments collaborate with provider on medication access solutions  Follow Up Plan: The care management team will reach out to the patient again over the next 90 days.             Medication Assistance: Application for Breztri/Eliquis  medication assistance program. in process.  Anticipated assistance start date unkonwn.  See plan of care for additional detail.  Compliance/Adherence/Medication fill history: Care Gaps: None at this time  Star-Rating Drugs: Atorvastatin 40 mg  90 DS 11/17/20  Patient's preferred pharmacy is:  [place], [place] - [address] [place] [address] Phone: [phone] Fax: [phone]  [place], [place] [address] Phone: [phone] Fax: [phone]  Uses pill box? No - caregiver gives him meds as scheduled Pt endorses 100% compliance  We discussed: Benefits of medication synchronization, packaging and delivery as well as enhanced pharmacist oversight with [employer]. Patient decided to: Continue current medication management strategy  Care Plan and Follow Up Patient Decision:  Patient agrees to Care Plan and Follow-up.  Plan: The care management team will reach out to the patient again over the next 90 days.  Beverly Milch, PharmD Clinical Pharmacist [facility] [phone]

## 2021-05-21 ENCOUNTER — Other Ambulatory Visit: Payer: Self-pay | Admitting: Family Medicine

## 2021-05-21 MED ORDER — METOPROLOL TARTRATE 25 MG PO TABS: 25.0000 mg | ORAL_TABLET | Freq: Two times a day (BID) | ORAL | 3 refills | Status: DC

## 2021-05-21 MED ORDER — FOLIC ACID 1 MG PO TABS: ORAL_TABLET | ORAL | 3 refills | Status: DC

## 2021-05-21 MED ORDER — ATORVASTATIN CALCIUM 40 MG PO TABS: 40.0000 mg | ORAL_TABLET | Freq: Every day | ORAL | 3 refills | Status: DC

## 2021-05-21 MED ORDER — ISOSORBIDE MONONITRATE ER 30 MG PO TB24: 15.0000 mg | ORAL_TABLET | Freq: Every day | ORAL | 3 refills | Status: DC

## 2021-05-21 MED ORDER — FLUTICASONE PROPIONATE 50 MCG/ACT NA SUSP: 2.0000 | Freq: Every day | NASAL | 3 refills | Status: DC

## 2021-05-21 NOTE — Telephone Encounter (Signed)
Received call from patient.   Requested refill on Ativan through mail order.   Ok to refill?? Last office visit 03/04/2021. Last refill 10/14/2020.  Also requested refill on Seroquel. Ok to refill?

## 2021-05-22 ENCOUNTER — Ambulatory Visit (INDEPENDENT_AMBULATORY_CARE_PROVIDER_SITE_OTHER): Payer: Medicare HMO | Admitting: Pharmacist

## 2021-05-22 DIAGNOSIS — J449 Chronic obstructive pulmonary disease, unspecified: Secondary | ICD-10-CM

## 2021-05-22 DIAGNOSIS — E785 Hyperlipidemia, unspecified: Secondary | ICD-10-CM

## 2021-05-22 MED ORDER — LORAZEPAM 0.5 MG PO TABS: 0.5000 mg | ORAL_TABLET | Freq: Two times a day (BID) | ORAL | 0 refills | Status: DC | PRN

## 2021-05-22 MED ORDER — QUETIAPINE FUMARATE 25 MG PO TABS: 25.0000 mg | ORAL_TABLET | Freq: Every day | ORAL | 3 refills | Status: DC

## 2021-05-22 NOTE — Patient Instructions (Addendum)
Visit Information    Patient Care Plan: LCSW Plan of Care     Problem Identified: Limited Mobility and Independence due to physical and breathing barriers   Priority: High     Long-Range Goal: Link pt with resources to assist with removal of barriers limiting his ability/independence Completed 02/19/2021  Start Date: 01/09/2021  Expected End Date: 03/15/2021  This Visit's Progress: On track  Recent Progress: On track  Priority: High  Note:   Current Barriers:  Ineffective Self Health Maintenance  Unable to independently manage self care Does not adhere to provider recommendations re: home health (pt declined due to home conditions-clutter) Oxygen tank limitations- per family, has to remain plugged in and limits him to not being able to get outside or go away for long (portable tanks have been unreliable per their report)  Clinical Goal(s):  Collaboration with [name], MD regarding development and update of comprehensive plan of care as evidenced by provider attestation and co-signature Inter-disciplinary care team collaboration (see longitudinal plan of care) patient will work with care management team to address care coordination and chronic disease management needs related to [place] support Other community resources     Interventions:  CSW made follow up contact with pt and his significant other/housemate/caregiver- [name].  They report they are doing ok; pt reports having a stomach virus today.  Per pt, nothing more with the home health services; says he will be seeing PCP next week- encouraged him to ask about this if nothing is heard by then. CSW will also alert RNCM.   CSW has provided and discussed resources as well as placed referral to Care Guide.  CSW will sign off seeing no current needs for LCSW.   Evaluation of current treatment plan related to COPD and Anxiety, Housing barriers, Social Isolation, Inability to  perform ADL's independently, Lacks knowledge of community resource: oxygen tank issues and need for physical therapy/occupational therapy, and other (TBD)  self-management and patient's adherence to plan as established by provider. Collaboration with [name], MD regarding development and update of comprehensive plan of care as evidenced by provider attestation       and co-signature Inter-disciplinary care team collaboration (see longitudinal plan of care) Discussed plans with patient for ongoing care management follow up and provided patient with direct contact information for care management team  Patient Goals: -expect call from Pharmacist - begin a notebook of services in my neighborhood or community - call 211 when I need some help - follow-up on any referrals for help I am given - think ahead to make sure my need does not become an emergency - make a list of family or friends that I can call     Follow Up Plan: CSW is signing off- please re-consult if LCSW needs arise.   Eduard Clos MSW, LCSW Licensed Clinical Social Worker [facility] [phone]     Patient Care Plan: COPD (Adult)     Problem Identified: Symptom Exacerbation (COPD)   Priority: High     Long-Range Goal: Symptom Exacerbation Prevented or Minimized   Start Date: 01/17/2021  Expected End Date: 07/19/2021  This Visit's Progress: On track  Recent Progress: On track  Priority: High  Note:   Current Barriers:  Knowledge deficits related to basic understanding of COPD disease process- continues to need reinforcement of teaching/ reinforcement COPD action plan, management of anxiety, has ativan and uses as prescribed. Pt reports he does not  do very well with pursed lip breathing.  Pt reports he is not smoking. Reports dyspnea with exertion. Knowledge deficit related to importance of energy conservation- caregiver would like patient to get up and walk, move around more, would like  pt to try and exercise his right hand/ wrist and pt is not doing this. Unable to self administer medications as prescribed- caregiver provides oversight with medications, pt reports he has all medications and taking as prescribed. 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 Pt reports he will not consider going to any outpatient services. Unable to perform ADLs independently- has DME and caregiver assists as needed, pt is able to fix food in the kitchen, able to bathe and ambulate, is able to drive but does not, caregiver transports patient to appointments, errands, etc. Does not contact provider office for questions/concerns - pt wants portable oxygen tank and per notes, pulmonologist reports pt is not candidate for this type of oxygen as this is inadequate for patient's needs, pt needs continuous flow oxygen with goal of 02 saturation 88-92%.  Pt reports he has not contacted pulmonologist to speak any further about portable oxygen. Pt needs reinforcement for COPD action plan if in yellow zone to call MD. Patient is working with [employer] for financial constraints, anxiety.  Care guide provided resources. Case Manager Clinical Goal(s): patient will report using inhalers as prescribed including rinsing mouth after use patient will report utilizing pursed lip breathing for shortness of breath patient will verbalize understanding of COPD action plan and when to seek appropriate levels of medical care patient will engage in lite exercise as tolerated to build/regain stamina and strength and reduce shortness of breath through activity tolerance patient will verbalize basic understanding of COPD disease process and self care  activities  Interventions:  Collaboration with [name], MD regarding development and update of comprehensive plan of care as evidenced by provider attestation and co-signature Inter-disciplinary care team collaboration (see longitudinal plan of care) Provided patient with verbal COPD education on self care/management/and exacerbation prevention  Reinforced COPD action plan and reinforced importance of daily self assessment Reviewed pursed lip breathing and importance  Reinforced with patient to self assesses COPD action plan zone and make appointment with provider if in the yellow zone for 48 hours without improvement. Encouraged patient to engage in light exercise as tolerated 3-5 days a week Reviewed importance of using oxygen as prescribed, reinforced with patient and significant other to talk with pulmonologist about any further options for oxygen as pt is not candidate for portable oxygen. Reviewed medications with patient and importance of using inhalers as prescribed Reinforced symptom management and calling MD early on for changes in health status/ symptoms Reviewed importance of utilizing home health PT/ OT and continue to rid home of clutter, in basket sent to primary care provider requesting home health PT/ OT if MD feels appropriate. Self-Care Activities:  Attends all scheduled provider appointments Attends church or other social activities Calls provider office for new concerns or questions Patient Goals: - continue to follow COPD rescue / action plan - eliminate symptom triggers at home- avoid being outside during heat of the day - follow rescue plan if symptoms flare-up - keep follow-up appointments  -  use inhalers and oxygen as prescribed- no smoking near oxygen - be careful with oxygen tubing so you do not fall - call doctor early on for change in health status/ symptoms Follow Up Plan: Telephone follow up appointment with care management team member scheduled for:   03/17/2021    Patient Care Plan: Coronary Artery Disease     Problem Identified: Health Promotion or Disease Self-Management for CAD (General Plan of Care)   Priority: High     Long-Range Goal: Self-Management Plan Developed for management of CAD   Start Date: 01/17/2021  Expected End Date: 07/19/2021  This Visit's Progress: On track  Recent Progress: On track  Priority: High  Note:   Current Barriers:  Ineffective Self Health Maintenance - patient needs reinforcement for management of CAD, diet Unable to self administer medications as prescribed- significant other (caregiver)is a nurse) assists with and provides oversight of medications Does not adhere to provider recommendations re: exercise, home health PT/ OT was ordered but patient declined due to clutter in the home that he and caregiver plan on removing, reports now interested and would like home health ordered, not interested in outpatient PT/ OT at present, attended previously and felt it was not helpful. Unable to perform ADLs independently-  has DME and uses as needed, WC, walker, cane, shower seat.  Pt reports he is walking currently without using DME but has on hand in case he needs, pt reports barrier to ambulating far distances is dyspnea. Clinical Goal(s):  Collaboration with [name], MD regarding development and update of comprehensive plan of care as evidenced by provider attestation and co-signature Inter-disciplinary care team collaboration (see longitudinal plan of care) patient will work with care management team to address care coordination and chronic disease management needs related to Disease Management Educational Needs Medication Management and Education Psychosocial Support   Interventions:  Evaluation of current treatment plan related to coronary artery disease. Self-management and patient's adherence to plan as established by provider. Collaboration with [name], MD regarding development  and update of comprehensive plan of care as evidenced by provider attestation       and co-signature Inter-disciplinary care team collaboration (see longitudinal plan of care) Reviewed plans with patient for ongoing care management follow up and provided patient with direct contact information for care management team Reviewed medications with patient and importance of taking as prescribed Reinforced signs/ symptoms MI and CVA and actions to take Reinforced importance of using nitroglycerin as prescribed and call 911 for unrelieved chest pain Encouraged patient (and caregiver) to try light activity and some walking daily Message sent to primary care provider for home health PT/ OT orders Self Care Activities:  Attends all scheduled provider appointments Attends church or other social activities Calls provider office for new concerns or questions Patient Goals: - try to do light exercise and walking short distances - know and watch for signs of a heart attack (e.g. chest pain, arm pain, nausea) - if I have chest pain, use nitroglycerin and call for help - take all medications as prescribed and continue working with pharmacist - learn about small changes that will make a big difference such as improvement in diet - choose fresh/ frozen fruits and vegetables, lean meats - follow up with primary care doctor on 7/11 and Dr. Tomi Likens on 8/4 Follow Up Plan: Telephone follow up appointment with care management team member scheduled for:  03/17/2021    Patient Care Plan: General Pharmacy (Adult)     Problem  Identified: Afib, GERD, COPD, HLD   Priority: High  Onset Date: 02/14/2021     Long-Range Goal: Patient-Specific Goal   Start Date: 02/14/2021  Expected End Date: 08/17/2021  This Visit's Progress: On track  Priority: High  Note:   Current Barriers:  Unable to independently afford treatment regimen Unable to achieve control of COPD  Unable to self administer medications as prescribed Does  not adhere to prescribed medication regimen  Pharmacist Clinical Goal(s):  Patient will verbalize ability to afford treatment regimen achieve control of COPD as evidenced by symptoms adhere to prescribed medication regimen as evidenced by fill dates through collaboration with PharmD and provider.   Interventions: 1:1 collaboration with [name], MD regarding development and update of comprehensive plan of care as evidenced by provider attestation and co-signature Inter-disciplinary care team collaboration (see longitudinal plan of care) Comprehensive medication review performed; medication list updated in electronic medical record  Hyperlipidemia: (LDL goal < 100) -Controlled -Current treatment: Atorvastatin 40mg   -Medications previously tried: simvastatin  -Current dietary patterns: patients caretaker admits it is sometimes hard to get hime to eat -Current exercise habits: minimal to none -Educated on Cholesterol goals;  Benefits of statin for ASCVD risk reduction; Importance of limiting foods high in cholesterol; -Recommended to continue current medication  Transient Atrial Fibrillation (Goal: prevent stroke and major bleeding) -Controlled -Current treatment: Rate control: N/A Anticoagulation: Eliquis 5mg  bid -Medications previously tried: none noted -Home BP and HR readings: monitors HR at home using pulse oximeter, has BP cuff at home also.  No logs of readings today, informed them to keep an eye on it and reach out if anything was out of range.  -Counseled on increased risk of stroke due to Afib and benefits of anticoagulation for stroke prevention; bleeding risk associated with Eliquis and importance of self-monitoring for signs/symptoms of bleeding; seeking medical attention after a head injury or if there is blood in the urine/stool; -Recommended to continue current medication Assessed patient finances. They are currently having a difficult time paying for Eliquis.   We will start PAP for [name] program as I believe he qualifies based on income.  Reviewed steps to application, patient will return once complete.  COPD (Goal: control symptoms and prevent exacerbations) -Uncontrolled -Current treatment  Breztri Aerosphere 160-9-4.8 mcg 2 puffs bid Albuterol HFA 90 mcg prn  Albuterol HFA 0.083% nebs prn -Medications previously tried: Trelegy, Spiriva, Symbicort  Pulmonary Functions Testing Results:  TLC  Date Value Ref Range Status  07/18/2013 5.13 L Final  -Exacerbations requiring treatment in last 6 months: one -Patient reports consistent use of maintenance inhaler, although he reports switching back and for the between his Breztri and Trelegy based on what he has in stock? -Frequency of rescue inhaler use: daily -Counseled on Proper inhaler technique; Benefits of consistent maintenance inhaler use Differences between maintenance and rescue inhalers -Recommended to continue current medication Assessed patient finances. Will also begin PAP for [facility] program.  Really emphasized adherence twice daily every day as he now will be getting the medication for free. He also needs a new nebulizer machine as his last one broke.  He gets this from [employer], will contact nurse to see about ordering a new one.  GERD (Goal: Control symptoms) -Not ideally controlled -Current treatment  Omeprazole 20mg  daily -Medications previously tried: none noted -Noted some symptoms of increasing acid reflux/heartburn lately.  He was taking this medication at lunch time with a few other medications.  -Recommended to continue current medication  Recommended he try to take medication first thing in the morning at least 30 to 60 minutes before food or drink so that it has time to work as directed.  Anxiety (Goal: Minimize symptoms) -Controlled -Current treatment: Lorazepam 0.5mg  half tablet bid prn -Medications previously tried/failed: Wellbutrin,  hydroxyzine -PHQ9:  PHQ9 SCORE ONLY 01/17/2021 11/06/2019 07/07/2019  PHQ-9 Total Score 0 0 0   -GAD7: No flowsheet data found.  -Patient using for sleep, reports sleep is off and on.  Sometimes does not go to bed until 5 to 6 am, caregiver stays up with him. -Educated on Benefits of medication for symptom control -Recommended to continue current medication  Patient Goals/Self-Care Activities Patient will:  - take medications as prescribed focus on medication adherence by fill date/pill count check blood pressure when symptomatic, document, and provide at future appointments collaborate with provider on medication access solutions  Follow Up Plan: The care management team will reach out to the patient again over the next 90 days.       Patient Care Plan: General Pharmacy (Adult)     Problem Identified: GERD, COPD, AFib, HLD   Priority: High  Onset Date: 05/22/2021     Long-Range Goal: Patient-Specific Goal   Start Date: 05/22/2021  Expected End Date: 11/20/2021  This Visit's Progress: On track  Priority: High  Note:   Current Barriers:  Unable to independently afford treatment regimen Unable to achieve control of COPD  Unable to self administer medications as prescribed Does not adhere to prescribed medication regimen  Pharmacist Clinical Goal(s):  Patient will verbalize ability to afford treatment regimen achieve control of COPD as evidenced by symptoms adhere to prescribed medication regimen as evidenced by fill dates through collaboration with PharmD and provider.   Interventions: 1:1 collaboration with [name], MD regarding development and update of comprehensive plan of care as evidenced by provider attestation and co-signature Inter-disciplinary care team collaboration (see longitudinal plan of care) Comprehensive medication review performed; medication list updated in electronic medical record  Hyperlipidemia: (LDL goal < 100) -Controlled -Current  treatment: Atorvastatin 40mg   -Medications previously tried: simvastatin  -Current dietary patterns: patients caretaker admits it is sometimes hard to get hime to eat -Current exercise habits: minimal to none -Educated on Cholesterol goals;  Benefits of statin for ASCVD risk reduction; Importance of limiting foods high in cholesterol; -Recommended to continue current medication  Update 05/22/21 LDL controlled < 100 at last lab checkup.  Continues to take medication as directed. Continue meds for now.  Transient Atrial Fibrillation (Goal: prevent stroke and major bleeding) -Controlled -Current treatment: Rate control: N/A Anticoagulation: Eliquis 5mg  bid -Medications previously tried: none noted -Home BP and HR readings: monitors HR at home using pulse oximeter, has BP cuff at home also.  No logs of readings today, informed them to keep an eye on it and reach out if anything was out of range.  -Counseled on increased risk of stroke due to Afib and benefits of anticoagulation for stroke prevention; bleeding risk associated with Eliquis and importance of self-monitoring for signs/symptoms of bleeding; seeking medical attention after a head injury or if there is blood in the urine/stool; -Recommended to continue current medication Assessed patient finances. They are currently having a difficult time paying for Eliquis.  We will start PAP for [name] program as I believe he qualifies based on income.  Reviewed steps to application, patient will return once complete.  COPD (Goal: control symptoms and prevent exacerbations) -Uncontrolled -Current treatment  Breztri  Aerosphere 160-9-4.8 mcg 2 puffs bid Albuterol HFA 90 mcg prn  Albuterol HFA 0.083% nebs prn -Medications previously tried: Trelegy, Spiriva, Symbicort Pulmonary Functions Testing Results:  TLC  Date Value Ref Range Status  07/18/2013 5.13 L Final  -Exacerbations requiring treatment in last 6 months:  one -Patient reports consistent use of maintenance inhaler, although he reports switching back and for the between his Breztri and Trelegy based on what he has in stock? -Frequency of rescue inhaler use: daily -Counseled on Proper inhaler technique; Benefits of consistent maintenance inhaler use Differences between maintenance and rescue inhalers -Recommended to continue current medication Assessed patient finances. Will also begin PAP for [facility] program.  Really emphasized adherence twice daily every day as he now will be getting the medication for free. He also needs a new nebulizer machine as his last one broke.  He gets this from [employer], will contact nurse to see about ordering a new one.  Update 05/22/21 Patient got a new nebulizer machine, he has not been approved for [facility] patient assistance.  I could not locate applications in the office.  Unclear if patient ever returned them.  He has tried these in the past with no luck so he is discouraged on the application process.  Encouraged him to complete and I will fax them in to manufacturer.  GERD (Goal: Control symptoms) -Not ideally controlled -Current treatment  Omeprazole 20mg  daily -Medications previously tried: none noted -Noted some symptoms of increasing acid reflux/heartburn lately.  He was taking this medication at lunch time with a few other medications.  -Recommended to continue current medication Recommended he try to take medication first thing in the morning at least 30 to 60 minutes before food or drink so that it has time to work as directed.  Anxiety (Goal: Minimize symptoms) -Controlled -Current treatment: Lorazepam 0.5mg  half tablet bid prn -Medications previously tried/failed: Wellbutrin, hydroxyzine -PHQ9:  PHQ9 SCORE ONLY 01/17/2021 11/06/2019 07/07/2019  PHQ-9 Total Score 0 0 0   -GAD7: No flowsheet data found. -Patient using for sleep, reports sleep is off and on.  Sometimes does not go to bed  until 5 to 6 am, caregiver stays up with him. -Educated on Benefits of medication for symptom control -Recommended to continue current medication  Patient Goals/Self-Care Activities Patient will:  - take medications as prescribed focus on medication adherence by fill date/pill count check blood pressure when symptomatic, document, and provide at future appointments collaborate with provider on medication access solutions  Follow Up Plan: The care management team will reach out to the patient again over the next 90 days.            Patient verbalizes understanding of instructions provided today and agrees to view in [place].  Telephone follow up appointment with pharmacy team member scheduled for: 6 months  Edythe Clarity, [place]

## 2021-05-26 ENCOUNTER — Other Ambulatory Visit: Payer: Self-pay

## 2021-05-26 ENCOUNTER — Encounter: Payer: Self-pay | Admitting: Family Medicine

## 2021-05-26 ENCOUNTER — Ambulatory Visit (INDEPENDENT_AMBULATORY_CARE_PROVIDER_SITE_OTHER): Payer: Medicare HMO | Admitting: Family Medicine

## 2021-05-26 VITALS — BP 148/78 | HR 72 | Temp 97.9°F | Resp 16

## 2021-05-26 DIAGNOSIS — L03811 Cellulitis of head [any part, except face]: Secondary | ICD-10-CM | POA: Diagnosis not present

## 2021-05-26 DIAGNOSIS — J441 Chronic obstructive pulmonary disease with (acute) exacerbation: Secondary | ICD-10-CM | POA: Diagnosis not present

## 2021-05-26 MED ORDER — PREDNISONE 20 MG PO TABS: ORAL_TABLET | ORAL | 0 refills | Status: DC

## 2021-05-26 MED ORDER — SILVER SULFADIAZINE 1 % EX CREA: 1.0000 "application " | TOPICAL_CREAM | Freq: Every day | CUTANEOUS | 0 refills | Status: DC

## 2021-05-26 MED ORDER — CEPHALEXIN 500 MG PO CAPS: 500.0000 mg | ORAL_CAPSULE | Freq: Three times a day (TID) | ORAL | 0 refills | Status: DC

## 2021-05-26 NOTE — Progress Notes (Signed)
Subjective:    Patient ID: Zachary Chambers, male    DOB: [birth year], 70 y.o.   MRN: [id number]  HPI Since I last saw the patient, he has been admitted to the hospital with acute coronary syndrome.  Cardiology recommended medical management.  He is supposed to be on isosorbide mononitrate, 15 mg daily.  He states that he is taking that.  He is also refraining from smoking and consistently using his oxygen 2 to 2-1/2 L by nasal cannula.  Unfortunately due to cost, he is no longer using his Breztri.  He tries to save his medication in case he really needs it.  He is also not using his albuterol.  I explained to both he and his girlfriend multiple times that [name] is a maintenance medication and the albuterol is a rescue medication and how and when to take them.  I also explained to him that respiratory distress will increase strain on his heart and likely cause repeat acute coronary syndrome or even death.  Therefore the best way to prevent repeat hospitalizations would be to be compliant with the [facility].  He also complains of pain behind his right ear.  In the area where the oxygen tubing sits, there is an erythematous patch of skin that is bright red and painful.  Is hot to the touch.  Is exquisitely tender.  There is also a scab that is oozing in the crease between his ear and his skull.  There is yellow honey light crust adherent to this and it is painful to the touch.  I believe he has a secondary infection in that area.  There is also a oval like fleshy papule on the superior portion of his ear that is soft and spongy and has a a red purpleish hue. Past Medical History:  Diagnosis Date   Alcohol abuse    Aortic atherosclerosis (HCC)    CAD in native artery    Chronic respiratory failure (HCC)    COPD (chronic obstructive pulmonary disease) (HCC)    DJD (degenerative joint disease)    diffusely   GERD (gastroesophageal reflux disease)    Hyperlipidemia    On home O2    Paroxysmal atrial  fibrillation (HCC)    Pericardial effusion    Pneumonia due to COVID-19 virus    PUD (peptic ulcer disease)    with bleeding   Rotator cuff disorder    has been evaluated by Dr Clifton James and [facility]   Stroke (cerebrum) [facility])     Past Surgical History:  Procedure Laterality Date   COLONOSCOPY     EYE SURGERY     right eye removed at age 22   heart stent     x3- 2006   Current Outpatient Medications on File Prior to Visit  Medication Sig Dispense Refill   albuterol (PROVENTIL) (2.5 MG/3ML) 0.083% nebulizer solution Take 3 mLs (2.5 mg total) by nebulization every 4 (four) hours as needed for wheezing or shortness of breath. 180 mL 4   albuterol (VENTOLIN HFA) 108 (90 Base) MCG/ACT inhaler INHALE 2 PUFFS INTO THE LUNGS EVERY 4 HOURS AS NEEDED FOR WHEEZING AND SHORTNESS OF BREATH (Patient taking differently: Inhale 2 puffs into the lungs every 4 (four) hours as needed for shortness of breath.) 18 g 0   apixaban (ELIQUIS) 5 MG TABS tablet Take 1 tablet (5 mg total) by mouth 2 (two) times daily. 60 tablet 3   aspirin 81 MG tablet Take 81 mg by mouth at bedtime.  atorvastatin (LIPITOR) 40 MG tablet Take 1 tablet (40 mg total) by mouth at bedtime. 90 tablet 3   Budeson-Glycopyrrol-Formoterol (BREZTRI AEROSPHERE) 160-9-4.8 MCG/ACT AERO Inhale 2 puffs into the lungs 2 (two) times daily. 17.7 g 3   Cholecalciferol (D3 PO) Take 1 tablet by mouth daily.     Cyanocobalamin (VITAMIN B-12 PO) Take 1 tablet by mouth daily.     fluticasone (FLONASE) 50 MCG/ACT nasal spray Place 2 sprays into both nostrils daily. 48 g 3   folic acid (FOLVITE) 1 MG tablet TAKE 1 TABLET BY MOUTH EVERY DAY 90 tablet 3   isosorbide mononitrate (IMDUR) 30 MG 24 hr tablet Take 0.5 tablets (15 mg total) by mouth daily. 45 tablet 3   LORazepam (ATIVAN) 0.5 MG tablet Take 1 tablet (0.5 mg total) by mouth 2 (two) times daily as needed for anxiety. 180 tablet 0   magnesium oxide (MAG-OX) 400 (240 Mg) MG tablet Take 1 tablet (400  mg total) by mouth daily. 100 tablet 0   metoprolol tartrate (LOPRESSOR) 25 MG tablet Take 1 tablet (25 mg total) by mouth 2 (two) times daily. 180 tablet 3   Multiple Vitamin (MULTIVITAMIN WITH MINERALS) TABS tablet Take 1 tablet by mouth daily. 90 tablet 0   nitroGLYCERIN (NITROSTAT) 0.4 MG SL tablet DISSOLVE 1 TABLET UNDER THE TONGUE EVERY 5 MINUTES AS NEEDED FOR CHEST PAIN 25 tablet 0   omeprazole (PRILOSEC) 40 MG capsule Take 1 capsule (40 mg total) by mouth daily. 90 capsule 3   QUEtiapine (SEROQUEL) 25 MG tablet Take 1 tablet (25 mg total) by mouth at bedtime. 90 tablet 3   thiamine 100 MG tablet Take 5 tablets (500 mg total) by mouth daily. 100 tablet 0   traMADol (ULTRAM) 50 MG tablet TAKE 1 TABLET BY MOUTH EVERY 6 HOURS AS NEEDED FOR moderate PAIN 30 tablet 0   zinc gluconate 50 MG tablet Take 50 mg by mouth daily.     potassium chloride SA (KLOR-CON) 20 MEQ tablet Take 1 tablet (20 mEq total) by mouth daily for 4 days. 4 tablet 0   No current facility-administered medications on file prior to visit.   No Known Allergies Social History   Socioeconomic History   Marital status: Single    Spouse name: Not on file   Number of children: Not on file   Years of education: Not on file   Highest education level: Not on file  Occupational History   Occupation: retired  Tobacco Use   Smoking status: Former    Packs/day: 1.00    Years: 40.00    Pack years: 40.00    Types: Cigarettes   Smokeless tobacco: Never   Tobacco comments:    declines patch  Vaping Use   Vaping Use: Never used  Substance and Sexual Activity   Alcohol use: Yes    Alcohol/week: 30.0 standard drinks    Types: 30 Cans of beer per week    Comment: h/o heavy use, now 4 beers per day   Drug use: Not Currently   Sexual activity: Yes  Other Topics Concern   Not on file  Social History Narrative   Right Handed   Lives in one story home       Patient has not smoked for 3 months   Patient on 2L of Oxygen     Social Determinants of Health   Financial Resource Strain: Medium Risk   Difficulty of Paying Living Expenses: Somewhat hard  Food Insecurity: No Food Insecurity  Worried About Charity fundraiser in the Last Year: Never true   [place] in the Last Year: Never true  Transportation Needs: No Transportation Needs   Lack of Transportation (Medical): No   Lack of Transportation (Non-Medical): No  Physical Activity: Inactive   Days of Exercise per Week: 0 days   Minutes of Exercise per Session: 0 min  Stress: Not on file  Social Connections: Not on file  Intimate Partner Violence: Not on file      Review of Systems  All other systems reviewed and are negative.     Objective:   Physical Exam Vitals reviewed.  Constitutional:      General: He is not in acute distress.    Appearance: He is not toxic-appearing.  HENT:     Head:   Cardiovascular:     Rate and Rhythm: Normal rate and regular rhythm.     Heart sounds: Normal heart sounds. No murmur heard.   No friction rub. No gallop.  Pulmonary:     Effort: Pulmonary effort is normal.     Breath sounds: Decreased air movement present. Decreased breath sounds and wheezing present.  Abdominal:     General: Abdomen is flat. Bowel sounds are normal.     Palpations: Abdomen is soft.  Musculoskeletal:     Right lower leg: No edema.     Left lower leg: No edema.  Neurological:     General: No focal deficit present.     Mental Status: He is alert and oriented to person, place, and time. Mental status is at baseline.     Cranial Nerves: No cranial nerve deficit.          COPD exacerbation ([place]) - Plan: CBC with Differential/Platelet, COMPLETE METABOLIC PANEL WITH GFR  Cellulitis of head except face His noncompliance with [name] has led to a COPD exacerbation.  Begin prednisone taper pack.  Resume Breztri 2 puffs inhaled twice daily.  I gave the patient 4 samples to try to improve compliance.  Treat the cellulitis  behind the right ear with Keflex 500 mg 3 times a day for 7 days.  Apply Silvadene to the impetigo like honey colored crust/wound behind the right ear.  Recheck in 10 to 14 days to see if it is healing.  Monitor the lesion on the superior aspect of the ear.  I believe that this might be a pyogenic granuloma versus malignancy.  Patient is too frail right now to undergo biopsy and treatment therefore I will simply monitor this.

## 2021-05-27 LAB — COMPLETE METABOLIC PANEL WITH GFR
AG Ratio: 1.5 (calc) (ref 1.0–2.5)
ALT: 10 U/L (ref 9–46)
AST: 25 U/L (ref 10–35)
Albumin: 4 g/dL (ref 3.6–5.1)
Alkaline phosphatase (APISO): 142 U/L (ref 35–144)
BUN: 13 mg/dL (ref 7–25)
CO2: 33 mmol/L — ABNORMAL HIGH (ref 20–32)
Calcium: 9.5 mg/dL (ref 8.6–10.3)
Chloride: 97 mmol/L — ABNORMAL LOW (ref 98–110)
Creat: 1 mg/dL (ref 0.70–1.28)
Globulin: 2.6 g/dL (calc) (ref 1.9–3.7)
Glucose, Bld: 114 mg/dL — ABNORMAL HIGH (ref 65–99)
Potassium: 4.5 mmol/L (ref 3.5–5.3)
Sodium: 137 mmol/L (ref 135–146)
Total Bilirubin: 0.5 mg/dL (ref 0.2–1.2)
Total Protein: 6.6 g/dL (ref 6.1–8.1)
eGFR: 80 mL/min/{1.73_m2} (ref >=60)

## 2021-05-27 LAB — CBC WITH DIFFERENTIAL/PLATELET
Absolute Monocytes: 624 cells/uL (ref 200–950)
Basophils Absolute: 97 cells/uL (ref 0–200)
Basophils Relative: 1.2 %
Eosinophils Absolute: 219 cells/uL (ref 15–500)
Eosinophils Relative: 2.7 %
HCT: 43.1 % (ref 38.5–50.0)
Hemoglobin: 13.6 g/dL (ref 13.2–17.1)
Lymphs Abs: 826 cells/uL — ABNORMAL LOW (ref 850–3900)
MCH: 26.2 pg — ABNORMAL LOW (ref 27.0–33.0)
MCHC: 31.6 g/dL — ABNORMAL LOW (ref 32.0–36.0)
MCV: 83 fL (ref 80.0–100.0)
MPV: 11.3 fL (ref 7.5–12.5)
Monocytes Relative: 7.7 %
Neutro Abs: 6334 cells/uL (ref 1500–7800)
Neutrophils Relative %: 78.2 %
Platelets: 397 10*3/uL (ref 140–400)
RBC: 5.19 10*6/uL (ref 4.20–5.80)
RDW: 15.3 % — ABNORMAL HIGH (ref 11.0–15.0)
Total Lymphocyte: 10.2 %
WBC: 8.1 10*3/uL (ref 3.8–10.8)

## 2021-06-02 ENCOUNTER — Other Ambulatory Visit: Payer: Self-pay

## 2021-06-02 MED ORDER — TRUE METRIX AIR GLUCOSE METER DEVI: 3 refills | Status: DC

## 2021-06-02 MED ORDER — ISOSORBIDE MONONITRATE ER 30 MG PO TB24
15.0000 mg | ORAL_TABLET | Freq: Every day | ORAL | 3 refills | Status: DC
Start: 2021-06-02 — End: 2022-06-09

## 2021-06-02 MED ORDER — TRUE METRIX LEVEL 1 LOW VI SOLN: 3 refills | Status: DC

## 2021-06-02 MED ORDER — QUETIAPINE FUMARATE 25 MG PO TABS: 25.0000 mg | ORAL_TABLET | Freq: Every day | ORAL | 3 refills | Status: DC

## 2021-06-02 MED ORDER — PREDNISONE 20 MG PO TABS: ORAL_TABLET | ORAL | 0 refills | Status: DC

## 2021-06-02 MED ORDER — RELION TRUE METRIX TEST STRIPS VI STRP: ORAL_STRIP | 3 refills | Status: DC

## 2021-06-05 ENCOUNTER — Ambulatory Visit: Payer: Medicare HMO | Admitting: Family Medicine

## 2021-06-06 ENCOUNTER — Ambulatory Visit: Payer: Medicare HMO | Admitting: Family Medicine

## 2021-06-06 DIAGNOSIS — H52223 Regular astigmatism, bilateral: Secondary | ICD-10-CM | POA: Diagnosis not present

## 2021-06-06 DIAGNOSIS — H524 Presbyopia: Secondary | ICD-10-CM | POA: Diagnosis not present

## 2021-06-11 DIAGNOSIS — U071 COVID-19: Secondary | ICD-10-CM | POA: Diagnosis not present

## 2021-06-11 DIAGNOSIS — I4891 Unspecified atrial fibrillation: Secondary | ICD-10-CM | POA: Diagnosis not present

## 2021-06-11 DIAGNOSIS — J9621 Acute and chronic respiratory failure with hypoxia: Secondary | ICD-10-CM | POA: Diagnosis not present

## 2021-06-11 DIAGNOSIS — R0600 Dyspnea, unspecified: Secondary | ICD-10-CM | POA: Diagnosis not present

## 2021-06-11 DIAGNOSIS — J449 Chronic obstructive pulmonary disease, unspecified: Secondary | ICD-10-CM | POA: Diagnosis not present

## 2021-06-11 DIAGNOSIS — J1282 Pneumonia due to coronavirus disease 2019: Secondary | ICD-10-CM | POA: Diagnosis not present

## 2021-06-12 ENCOUNTER — Encounter (HOSPITAL_BASED_OUTPATIENT_CLINIC_OR_DEPARTMENT_OTHER): Payer: Self-pay | Admitting: Family

## 2021-06-12 ENCOUNTER — Ambulatory Visit (HOSPITAL_BASED_OUTPATIENT_CLINIC_OR_DEPARTMENT_OTHER): Payer: Medicare HMO | Admitting: Family

## 2021-06-12 ENCOUNTER — Other Ambulatory Visit: Payer: Self-pay

## 2021-06-12 VITALS — BP 144/72 | HR 75 | Ht 68.0 in | Wt 115.5 lb

## 2021-06-12 DIAGNOSIS — R051 Acute cough: Secondary | ICD-10-CM

## 2021-06-12 DIAGNOSIS — I4891 Unspecified atrial fibrillation: Secondary | ICD-10-CM

## 2021-06-12 DIAGNOSIS — I480 Paroxysmal atrial fibrillation: Secondary | ICD-10-CM | POA: Diagnosis not present

## 2021-06-12 DIAGNOSIS — I2510 Atherosclerotic heart disease of native coronary artery without angina pectoris: Secondary | ICD-10-CM

## 2021-06-12 DIAGNOSIS — D6869 Other thrombophilia: Secondary | ICD-10-CM

## 2021-06-12 MED ORDER — BENZONATATE 100 MG PO CAPS: 100.0000 mg | ORAL_CAPSULE | Freq: Three times a day (TID) | ORAL | 0 refills | Status: DC | PRN

## 2021-06-12 MED ORDER — METOPROLOL TARTRATE 25 MG PO TABS: 25.0000 mg | ORAL_TABLET | Freq: Two times a day (BID) | ORAL | 3 refills | Status: DC

## 2021-06-12 NOTE — Progress Notes (Signed)
Office Visit    Patient Name: Zachary Chambers Date of Encounter: 06/12/2021  PCP:  Zachary Frizzle, MD   [place]  Cardiologist:  Zachary Rouge, MD  Advanced Practice Provider:  No care team member to display Electrophysiologist:  None      Chief Complaint    Zachary Chambers is a 70 y.o. male with a hx of CAD (inferolateral MI 2002 s/p PCI to LCx 2002 with staged PCI to LAD 2003) COPD/severe emphsema, chronic hypoxic respiratory failure (on O2 after Covid PNA 06/2020), HLD, ETOH use, cognitive decline. 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, PAF, aortic atherosclerosis, moderate pericardial effusion by echo 11/2020 and mild by echo 02/2021, physical deconditioning presents today for hospital follow up   Past Medical History    Past Medical History:  Diagnosis Date   Alcohol abuse    Aortic atherosclerosis ([place])    CAD in native artery    Chronic respiratory failure (HCC)    COPD (chronic obstructive pulmonary disease) (HCC)    DJD (degenerative joint disease)    diffusely   GERD (gastroesophageal reflux disease)    Hyperlipidemia    On home O2    Paroxysmal atrial fibrillation (HCC)    Pericardial effusion    Pneumonia due to COVID-19 virus    PUD (peptic ulcer disease)    with bleeding   Rotator cuff disorder    has been evaluated by Dr Zachary Chambers and Zachary Chambers   Stroke (cerebrum) [facility])    Past Surgical History:  Procedure Laterality Date   COLONOSCOPY     EYE SURGERY     right eye removed at age 41   heart stent     x3- 2006    Allergies  No Known Allergies  History of Present Illness    Zachary Chambers is a 70 y.o. male with a hx of  CAD (inferolateral MI 2002 s/p PCI to LCx 2002 with staged PCI to LAD 2003) COPD/severe emphsema, chronic hypoxic respiratory failure (on O2 after Covid PNA 06/2020), HLD, ETOH use, cognitive decline. 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  He had inferolateral MI 2002 treated with angioplasty/stent to the LCx. Repeat cath 2003 with staged PCI/DES to the LAD with residual mild RCA disease and normal LVEF. Nuclear stress test during 02/2020 admission was normal. 06/2020 admitted COVID pneumonia and required O2 at discharge. There were reports of transient atrial fib during admission and Eliquis started outpatient by primary care. Cardiology was not consulted. Seen as outpatient 08/2020 with right sided weaness with imaging showing CVA. 11/2020 he was admitted with COPD exacerbation and echo LVEF 60-65%, gr2DD, mildly reduced LVEF, mildly elevated PASP, moderate pericardial effusion with equivocal evidence of tamponade but abnormalities felt to be due to increased work of breathing rather than tamponade.   He was admitted 02/2021 with AMD and hallucinations. He had chest pain and HS troponins 443 ? 535 ? 645 ? 577. Echo 03/12/21 LVEF 65-70%, no RWMA, mild LVH, indeterminite diastolic parameters, small circumferential pericardial effusion, mild calcification on the aortic valve with no stenosis. Given runs of SVT Metoprolol initiated.   Readmitted 04/19/21-04/21/21 with chest pain. He was treated with 48 hours of Imdur. Metoprolol and Imdur were continued at discharge.   Notes being dyspneic on exertion and also with rest.   He tells me his wheezing  improved but still present. Endorses productive cough with clear mucus.    EKGs/Labs/Other Studies Reviewed:   The following studies were reviewed today:  Echo 02/2021  1. Left ventricular ejection fraction, by estimation, is 65 to 70%. The  left ventricle has normal function. The left ventricle has no regional  wall motion abnormalities. There is mild left ventricular hypertrophy.  Left ventricular diastolic parameters  are indeterminate.   2. Right ventricular systolic function was not well visualized. The right   ventricular size is not well visualized. Tricuspid regurgitation signal is  inadequate for assessing PA pressure.   3. A small pericardial effusion is present. The pericardial effusion is  circumferential.   4. The mitral valve is normal in structure. No evidence of mitral valve  regurgitation. No evidence of mitral stenosis.   5. The aortic valve was not well visualized. There is mild calcification  of the aortic valve. There is mild thickening of the aortic valve. Aortic  valve regurgitation is not visualized. No aortic stenosis is present.   6. The inferior vena cava is normal in size with greater than 50%  respiratory variability, suggesting right atrial pressure of 3 mmHg.  08/2001                          Cardiac Catheterization   HISTORY:  Zachary Chambers presented with an inferolateral myocardial infarction on November 25th and had stents placed in the left circumflex sequentially.  He had a severe stenosis in the mid left anterior descending, as well as moderate distal disease in the right coronary artery.  He is referred for cardiac catheterization and probable angioplasty.   PROCEDURES PERFORMED:  Left heart catheterization with selective coronary angiography and left ventricular angiography with angioplasty and stenting of the left anterior descending.   TYPE AND SITE OF ENTRY:  Percutaneous right femoral artery.   CATHETERS:  Six [place] #4 curved Judkins right and left coronary catheters, 6-French pigtail ventriculography catheter, 7-French JL-4 guide, a Forte guide wire, BX Velocity 3.0 x 18 mm stent for primary stenting.   MEDICATIONS GIVEN DURING PROCEDURE:  Versed 4 mg IV, Integrilin, heparin, and IV nitroglycerin.   CONTRAST MATERIAL:  Omnipaque.   COMMENTS:  The patient tolerated the procedure well.   HEMODYNAMIC DATA: 1. Aortic pressure was 128/80. 2. LV pressure was 110/13. 3. There was no aortic valve gradient noted on pullback.   ANGIOGRAPHIC DATA: 1. Left  main coronary artery:  Normal. 2. Left anterior descending:  The left anterior descending has irregularities    proximally and gives off a moderate sized second diagonal vessel.  Just    beyond the origin of the diagonal vessel there is a segmental 75% stenosis. 3. Left circumflex:  The left circumflex has the presence of two stents that    are radiopaque and easily located.  There is mild narrowing in between the    stents at the level of the origin of a continuation branch.  The stents    only had mild intimal hyperplasia and are widely patent with excellent    flow. 4. Right coronary artery:  The right coronary artery initially had ostial    spasm.  IV nitroglycerin was given with resolution of most of the spasm,    but with a residual 20-30% ostial narrowing and a 30-50% narrowing before    the crux.  The distal right coronary artery was widely patent.   LEFT VENTRICULAR ANGIOGRAM:  The left  ventricular angiogram was performed in the RAO position.  Overall cardiac size and silhouette are normal.  Left ventricular function is normal, with a global ejection fraction of 65%.  There is no mitral regurgitation, intracardiac calcification, or intracavitary filling defect.   ANGIOPLASTY PROCEDURE:  We used a JL-4 guide and used a Forte guide wire with marker wires.  We elected to use an 18 mm x 3.0 BX Velocity stent.  This was placed primarily and inflated initially to 14 atm and then in followup to 12 atm.  The final angiographic result was excellent with no residual stenosis, no dissection, and preservation of all side branches.   OVERALL IMPRESSION: 1. Persistent patency of the stent to the left circumflex coronary artery. 2. Successful stent placement in the left anterior descending coronary. 3. Mild to moderate disease in the right coronary artery with ostial spasm    present. 4. Normal left ventricular function. Dictated by:   Ludwig Lean Doreatha Lew, M.D. Attending Physician:   Worthy Flank DD:  09/07/01 TD:  09/07/01 Job: [id number]   2D Echo 11/2020 IMPRESSIONS     1. Left ventricular ejection fraction, by estimation, is 60 to 65%. The  left ventricle has normal function. The left ventricle has no regional  wall motion abnormalities. Left ventricular diastolic parameters are  consistent with Grade II diastolic  dysfunction (pseudonormalization). Elevated left atrial pressure.   2. Right ventricular systolic function is mildly reduced. The right  ventricular size is normal. There is mildly elevated pulmonary artery  systolic pressure.   3. Moderate pericardial effusion. The pericardial effusion is anterior to  the right ventricle and surrounding the apex. There is equivocal evidence  of cardiac tamponade.   4. The mitral valve is normal in structure. No evidence of mitral valve  regurgitation.   5. The aortic valve is tricuspid. Aortic valve regurgitation is not  visualized. Mild aortic valve sclerosis is present, with no evidence of  aortic valve stenosis.   6. The inferior vena cava is dilated in size with >50% respiratory  variability, suggesting right atrial pressure of 8 mmHg.   7. There is enhanced respiratory displacement of the ventricular septum  and mitral inflow respiratory variation in early filling velocities. There  is no RA or RV collapse. The inferior vena cava is mildly dilated, but not  plethoric.   8. The overall impression is that the above abnormalities are due to  increased work of breathing (e.g. COPD exacerbation), rather than  pericardial tamponade.   Comparison(s): Prior images reviewed side by side. The left ventricular  function is unchanged. The right ventricular systolic function is  unchanged. The pericardial effusion is only slightly larger compared to  last year's study. Increased respiratory  flow variation on the mitral inflow was also seen at that time. The  inferior vena cava is more dilated on the  current study.   Conclusion(s)/Recommendation(s): Consider serial studies for the  pericardial effusion, but pericardial tamponade is felt to be unlikely.        EKG: No EKG today  Recent Labs: 03/11/2021: B Natriuretic Peptide 466.0 03/12/2021: TSH 0.904 04/19/2021: Magnesium 2.3 05/26/2021: ALT 10; BUN 13; Creat 1.00; Hemoglobin 13.6; Platelets 397; Potassium 4.5; Sodium 137  Recent Lipid Panel    Component Value Date/Time   CHOL 154 03/12/2021 0546   TRIG 35 03/12/2021 0546   HDL 55 03/12/2021 0546   CHOLHDL 2.8 03/12/2021 0546   VLDL 7 03/12/2021 0546   LDLCALC 92 03/12/2021 0546  [place] 60 11/06/2019 1440    Risk Assessment/Calculations:   CHA2DS2-VASc Score = 5   This indicates a 7.2% annual risk of stroke. The patient's score is based upon: CHF History: 0 HTN History: 1 Diabetes History: 0 Stroke History: 2 Vascular Disease History: 1 Age Score: 1 Gender Score: 0   Home Medications   Current Meds  Medication Sig   apixaban (ELIQUIS) 5 MG TABS tablet Take 1 tablet (5 mg total) by mouth 2 (two) times daily.   aspirin 81 MG tablet Take 81 mg by mouth at bedtime.   atorvastatin (LIPITOR) 40 MG tablet Take 1 tablet (40 mg total) by mouth at bedtime.   Blood Glucose Calibration (TRUE METRIX LEVEL 1) Low SOLN USE AS DIRECTED. To monitor fsbs 2 times per day dx e11.9   Blood Glucose Monitoring Suppl (TRUE METRIX AIR GLUCOSE METER) DEVI USE AS DIRECTED. To monitor fsbs 2 times per day dx e11.9   Budeson-Glycopyrrol-Formoterol (BREZTRI AEROSPHERE) 160-9-4.8 MCG/ACT AERO Inhale 2 puffs into the lungs 2 (two) times daily.   fluticasone (FLONASE) 50 MCG/ACT nasal spray Place 2 sprays into both nostrils daily.   folic acid (FOLVITE) 1 MG tablet TAKE 1 TABLET BY MOUTH EVERY DAY   glucose blood (RELION TRUE METRIX TEST STRIPS) test strip USE AS DIRECTED. To monitor fsbs 2 times per day dx e11.9   isosorbide mononitrate (IMDUR) 30 MG 24 hr tablet Take 0.5 tablets (15 mg total)  by mouth daily.   magnesium oxide (MAG-OX) 400 (240 Mg) MG tablet Take 1 tablet (400 mg total) by mouth daily.   metoprolol tartrate (LOPRESSOR) 25 MG tablet Take 1 tablet (25 mg total) by mouth 2 (two) times daily.   Multiple Vitamin (MULTIVITAMIN WITH MINERALS) TABS tablet Take 1 tablet by mouth daily.   nitroGLYCERIN (NITROSTAT) 0.4 MG SL tablet DISSOLVE 1 TABLET UNDER THE TONGUE EVERY 5 MINUTES AS NEEDED FOR CHEST PAIN   omeprazole (PRILOSEC) 40 MG capsule Take 1 capsule (40 mg total) by mouth daily.   predniSONE (DELTASONE) 20 MG tablet 3 tabs poqday 1-2, 2 tabs poqday 3-4, 1 tab poqday 5-6   QUEtiapine (SEROQUEL) 25 MG tablet Take 1 tablet (25 mg total) by mouth at bedtime.   silver sulfADIAZINE (SILVADENE) 1 % cream Apply 1 application topically daily.   thiamine 100 MG tablet Take 5 tablets (500 mg total) by mouth daily.   traMADol (ULTRAM) 50 MG tablet TAKE 1 TABLET BY MOUTH EVERY 6 HOURS AS NEEDED FOR moderate PAIN   zinc gluconate 50 MG tablet Take 50 mg by mouth daily.     Review of Systems      All other systems reviewed and are otherwise negative except as noted above.  Physical Exam    VS:  BP (!) 144/72 (BP Location: Right Arm, Patient Position: Sitting, Cuff Size: Normal)   Pulse 75   Ht _0  (1.727 m)   Wt 115 lb 8 oz (52.4 kg)   SpO2 97% Comment: 2L oxygen  BMI 17.56 kg/m  , BMI Body mass index is 17.56 kg/m.  Wt Readings from Last 3 Encounters:  06/12/21 115 lb 8 oz (52.4 kg)  04/28/21 122 lb 9.2 oz (55.6 kg)  04/21/21 122 lb 9.2 oz (55.6 kg)     GEN: Well nourished, well developed, in no acute distress. HEENT: normal. Neck: Supple, no JVD, carotid bruits, or masses. Cardiac: RRR, no murmurs, rubs, or gallops. No clubbing, cyanosis, edema.  Radials/PT 2+ and equal bilaterally.  Respiratory:  Respirations regular and unlabored, clear to auscultation bilaterally. GI: Soft, nontender, nondistended. MS: No deformity or atrophy. Skin: Warm and dry, no  rash. Neuro:  Strength and sensation are intact. Psych: Normal affect.  Assessment & Plan    CAD - Stable with no anginal symptoms. No indication for ischemic evaluation.  Recent admissionw ith unstable angina treated with 48 hours of heparin. Poor candidate for cardiac cath given recurrent falls, weakness. GDMT includes Imdur, metoprolol, atorvastatin. No aspirin due to chronic anticoagulation. If recurrent chest pain, could consider increased dose Imdur.   HLD - Continue Atorvastatin 66m QD. Denies myalgias.   Chronic respiratory failure with hypoxia secondary to COPD - Recent exacerbation in setting of incorrect use of Breztri. Recent completed prednisone course by PCP. Still with productive cough. Lung sounds diminished bilaterally. Will Rx Tessalon tablets and recommend OTC Mucicnex. Encouraged to contact PCP for follow up.   PAF / SVT / Chronic anticoagulation- Telemetry 02/2021 during admission with question of paroxysmal atrial flutter vs SVT. PAF noted during previous admission. Continue Eliquis 535mBID. Careful monitoring of bleeding complications given history of frequent falls. CHA2DS2-VASc Score = 5 [CHF History: 0, HTN History: 1, Diabetes History: 0, Stroke History: 2, Vascular Disease History: 1, Age Score: 1, Gender Score: 0].  Therefore, the patient's annual risk of stroke is 7.2 %.     Percicardial effusion - Small by echo 02/2021. No indication for diuretic at this time.   Disposition: Follow up in 3 month(s) with Dr. NiJohnsie Cancelr APP.  Signed, CaLoel DubonnetNP 06/12/2021, 3:07 PM [place]

## 2021-06-12 NOTE — Patient Instructions (Addendum)
Medication Instructions:  Your physician has recommended you make the following change in your medication:   CHANGE Metoprolol to one 25mg  tablet twice daily  START Tessalon as needed three times per day for cough   You may use Muccinex (Guafenesin) as needed for congestion.   Ensure you are taking Breztri twice per day. Use your Albuterol inhaler as needed for wheezing or shortness of breath.   *If you need a refill on your cardiac medications before your next appointment, please call your pharmacy*   Lab Work: None ordered today.   Testing/Procedures: None ordered today.    Follow-Up: At [facility], you and your health needs are our priority.  As part of our continuing mission to provide you with exceptional heart care, we have created designated Provider Care Teams.  These Care Teams include your primary Cardiologist (physician) and Advanced Practice Providers (APPs -  Physician Assistants and Nurse Practitioners) who all work together to provide you with the care you need, when you need it.  We recommend signing up for the patient portal called "MyChart".  Sign up information is provided on this After Visit Summary.  MyChart is used to connect with patients for Virtual Visits (Telemedicine).  Patients are able to view lab/test results, encounter notes, upcoming appointments, etc.  Non-urgent messages can be sent to your provider as well.   To learn more about what you can do with MyChart, go to [email or web address].    Your next appointment:   3 month(s)  The format for your next appointment:   In Person  Provider:   You may see [name], MD or one of the following Advanced Practice Providers on your designated Care Team:   Cecilie Kicks, NP Loel Dubonnet, NP    Other Instructions  Heart Healthy Diet Recommendations: A low-salt diet is recommended. Meats should be grilled, baked, or boiled. Avoid fried foods. Focus on lean protein sources like fish or  chicken with vegetables and fruits. The American Heart Association is a Microbiologist!  American Heart Association Diet and Lifeystyle Recommendations    Exercise recommendations: The American Heart Association recommends 150 minutes of moderate intensity exercise weekly. Try 30 minutes of moderate intensity exercise 4-5 times per week. This could include walking, jogging, or swimming.

## 2021-06-14 DIAGNOSIS — J449 Chronic obstructive pulmonary disease, unspecified: Secondary | ICD-10-CM | POA: Diagnosis not present

## 2021-06-15 DIAGNOSIS — M6281 Muscle weakness (generalized): Secondary | ICD-10-CM | POA: Diagnosis not present

## 2021-06-15 DIAGNOSIS — J449 Chronic obstructive pulmonary disease, unspecified: Secondary | ICD-10-CM | POA: Diagnosis not present

## 2021-06-16 DIAGNOSIS — E785 Hyperlipidemia, unspecified: Secondary | ICD-10-CM

## 2021-06-16 DIAGNOSIS — J449 Chronic obstructive pulmonary disease, unspecified: Secondary | ICD-10-CM

## 2021-06-17 DIAGNOSIS — J449 Chronic obstructive pulmonary disease, unspecified: Secondary | ICD-10-CM | POA: Diagnosis not present

## 2021-06-19 ENCOUNTER — Telehealth: Payer: Self-pay | Admitting: *Deleted

## 2021-06-19 DIAGNOSIS — E785 Hyperlipidemia, unspecified: Secondary | ICD-10-CM

## 2021-06-19 DIAGNOSIS — J449 Chronic obstructive pulmonary disease, unspecified: Secondary | ICD-10-CM

## 2021-06-19 DIAGNOSIS — E43 Unspecified severe protein-calorie malnutrition: Secondary | ICD-10-CM
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
## 2021-06-19 NOTE — Addendum Note (Signed)
Addended by: Sheral Flow on: 06/19/2021 03:32 PM   Modules accepted: Orders

## 2021-06-19 NOTE — Telephone Encounter (Signed)
New referral orders for CCM SW placed.

## 2021-06-19 NOTE — Telephone Encounter (Signed)
Margreta Journey, I have forwarded to Eduard Clos LCSW who has seen this patient in the past.    Marcie Bal - cant you still see him?  Or does another referral need to be placed/  Thanks! Gerald Stabs

## 2021-06-20 ENCOUNTER — Encounter: Payer: Self-pay | Admitting: *Deleted

## 2021-06-20 ENCOUNTER — Telehealth: Payer: Self-pay | Admitting: *Deleted

## 2021-06-20 ENCOUNTER — Telehealth: Payer: Self-pay

## 2021-06-20 NOTE — Telephone Encounter (Signed)
Call placed to patient.   Advised that appointment would be need to assess rash. Advised to try to keep groin clean and dry.   Also advised that [facility] does not have samples of Albuterol. Advised that albuterol is relatively cheap and is generic.

## 2021-06-20 NOTE — Chronic Care Management (AMB) (Signed)
  Chronic Care Management   Note  06/20/2021 Name: SION REINDERS MRN: [id number] DOB: 18-Jun-1951  Skipper T Donaho is a 70 y.o. year old male who is a primary care patient of Pickard, Cammie Mcgee, MD. Geronimo EITHEN CASTIGLIA is currently enrolled in care management services. An additional referral for Social Worker was placed.   Follow up plan: Telephone appointment with care management team member scheduled for:06/27/21  [facility]  Direct Dial: [phone]

## 2021-06-20 NOTE — Telephone Encounter (Signed)
Pt's (Zachary Chambers) [name] called asking for a prescription for a cream for a rash pt has developed in his private area, and would also like to know if the office has any samples of albuterol. Please advise.  Cb#: [phone]

## 2021-06-24 ENCOUNTER — Ambulatory Visit: Payer: Medicare HMO | Admitting: Family Medicine

## 2021-06-27 ENCOUNTER — Ambulatory Visit (INDEPENDENT_AMBULATORY_CARE_PROVIDER_SITE_OTHER): Payer: Medicare HMO | Admitting: *Deleted

## 2021-06-27 DIAGNOSIS — L03811 Cellulitis of head [any part, except face]: Secondary | ICD-10-CM

## 2021-06-27 DIAGNOSIS — I6930 Unspecified sequelae of cerebral infarction: Secondary | ICD-10-CM

## 2021-06-27 DIAGNOSIS — J449 Chronic obstructive pulmonary disease, unspecified: Secondary | ICD-10-CM

## 2021-06-27 DIAGNOSIS — I4891 Unspecified atrial fibrillation: Secondary | ICD-10-CM

## 2021-06-27 DIAGNOSIS — R29898 Other symptoms and signs involving the musculoskeletal system: Secondary | ICD-10-CM

## 2021-06-27 DIAGNOSIS — M6281 Muscle weakness (generalized): Secondary | ICD-10-CM

## 2021-06-27 NOTE — Chronic Care Management (AMB) (Signed)
Chronic Care Management    Clinical Social Work Note  06/27/2021 Name: Zachary Chambers MRN: [id number] DOB: [birth year]  Zachary Chambers is a 70 y.o. year old male who is a primary care patient of Chambers, Zachary Mcgee, MD. The CCM team was consulted to assist the patient with chronic disease management and/or care coordination needs related to: [employer]  and Caregiver Stress.   Engaged with patient by telephone for initial visit in response to provider referral for social work chronic care management and care coordination services.   Consent to Services:  The patient was given information about Chronic Care Management services, agreed to services, and gave verbal consent prior to initiation of services.  Please see initial visit note for detailed documentation.   Patient agreed to services and consent obtained.   Assessment: Review of patient past medical history, allergies, medications, and health status, including review of relevant consultants reports was performed today as part of a comprehensive evaluation and provision of chronic care management and care coordination services.     SDOH (Social Determinants of Health) assessments and interventions performed:  SDOH Interventions    Flowsheet Row Most Recent Value  SDOH Interventions   Housing Interventions Intervention Not Indicated  Transportation Interventions Intervention Not Indicated        Advanced Directives Status: Not addressed in this encounter.  CCM Care Plan  No Known Allergies  Outpatient Encounter Medications as of 06/27/2021  Medication Sig Note   albuterol (PROVENTIL) (2.5 MG/3ML) 0.083% nebulizer solution Take 3 mLs (2.5 mg total) by nebulization every 4 (four) hours as needed for wheezing or shortness of breath. (Patient not taking: Reported on 06/12/2021)    albuterol (VENTOLIN HFA) 108 (90 Base) MCG/ACT inhaler INHALE 2 PUFFS INTO THE LUNGS EVERY 4 HOURS AS NEEDED FOR WHEEZING AND SHORTNESS OF BREATH  (Patient not taking: Reported on 06/12/2021)    apixaban (ELIQUIS) 5 MG TABS tablet Take 1 tablet (5 mg total) by mouth 2 (two) times daily.    aspirin 81 MG tablet Take 81 mg by mouth at bedtime.    atorvastatin (LIPITOR) 40 MG tablet Take 1 tablet (40 mg total) by mouth at bedtime.    benzonatate (TESSALON PERLES) 100 MG capsule Take 1 capsule (100 mg total) by mouth 3 (three) times daily as needed for cough.    Blood Glucose Calibration (TRUE METRIX LEVEL 1) Low SOLN USE AS DIRECTED. To monitor fsbs 2 times per day dx e11.9    Blood Glucose Monitoring Suppl (TRUE METRIX AIR GLUCOSE METER) DEVI USE AS DIRECTED. To monitor fsbs 2 times per day dx e11.9    Budeson-Glycopyrrol-Formoterol (BREZTRI AEROSPHERE) 160-9-4.8 MCG/ACT AERO Inhale 2 puffs into the lungs 2 (two) times daily.    cephALEXin (KEFLEX) 500 MG capsule Take 1 capsule (500 mg total) by mouth 3 (three) times daily. (Patient not taking: Reported on 06/12/2021)    Cholecalciferol (D3 PO) Take 1 tablet by mouth daily. (Patient not taking: Reported on 06/12/2021)    Cyanocobalamin (VITAMIN B-12 PO) Take 1 tablet by mouth daily. (Patient not taking: Reported on 06/12/2021) 04/19/2021: Pt is not sure about this medication   fluticasone (FLONASE) 50 MCG/ACT nasal spray Place 2 sprays into both nostrils daily.    folic acid (FOLVITE) 1 MG tablet TAKE 1 TABLET BY MOUTH EVERY DAY    glucose blood (RELION TRUE METRIX TEST STRIPS) test strip USE AS DIRECTED. To monitor fsbs 2 times per day dx e11.9    isosorbide mononitrate (IMDUR) 30  MG 24 hr tablet Take 0.5 tablets (15 mg total) by mouth daily.    LORazepam (ATIVAN) 0.5 MG tablet Take 1 tablet (0.5 mg total) by mouth 2 (two) times daily as needed for anxiety. (Patient not taking: Reported on 06/12/2021)    magnesium oxide (MAG-OX) 400 (240 Mg) MG tablet Take 1 tablet (400 mg total) by mouth daily.    metoprolol tartrate (LOPRESSOR) 25 MG tablet Take 1 tablet (25 mg total) by mouth 2 (two) times  daily.    Multiple Vitamin (MULTIVITAMIN WITH MINERALS) TABS tablet Take 1 tablet by mouth daily. 03/12/2021: Centrum for men   nitroGLYCERIN (NITROSTAT) 0.4 MG SL tablet DISSOLVE 1 TABLET UNDER THE TONGUE EVERY 5 MINUTES AS NEEDED FOR CHEST PAIN    omeprazole (PRILOSEC) 40 MG capsule Take 1 capsule (40 mg total) by mouth daily.    potassium chloride SA (KLOR-CON) 20 MEQ tablet Take 1 tablet (20 mEq total) by mouth daily for 4 days.    predniSONE (DELTASONE) 20 MG tablet 3 tabs poqday 1-2, 2 tabs poqday 3-4, 1 tab poqday 5-6    QUEtiapine (SEROQUEL) 25 MG tablet Take 1 tablet (25 mg total) by mouth at bedtime.    silver sulfADIAZINE (SILVADENE) 1 % cream Apply 1 application topically daily.    thiamine 100 MG tablet Take 5 tablets (500 mg total) by mouth daily.    traMADol (ULTRAM) 50 MG tablet TAKE 1 TABLET BY MOUTH EVERY 6 HOURS AS NEEDED FOR moderate PAIN    zinc gluconate 50 MG tablet Take 50 mg by mouth daily.    No facility-administered encounter medications on file as of 06/27/2021.    Patient Active Problem List   Diagnosis Date Noted   Primary hypertension    Depression with anxiety    ACS (acute coronary syndrome) ([place]) 79/09/4095   Metabolic encephalopathy 35/32/9924   Hypokalemia 26/83/4196   Acute metabolic encephalopathy 22/29/7989   Hypoxemia 12/10/2020   COPD exacerbation ([place]) 12/09/2020   Acute on chronic respiratory failure with hypoxia ([place]) 12/09/2020   Demand ischemia ([place]) 12/09/2020   Anxiety 10/07/2020   History of CVA with residual deficit 10/07/2020   Pneumonia due to COVID-19 virus 07/05/2020   Chronic respiratory failure ([place]) 07/05/2020   Transient atrial fibrillation ([place]) 07/05/2020   Dyspnea 07/04/2020   Protein-calorie malnutrition, severe 03/07/2020   S/P coronary artery stent placement    Chest pain 03/06/2020   Hyperlipidemia 03/06/2020   Healthcare maintenance 03/05/2020   Heavy alcohol use 03/04/2018   Memory changes 03/04/2018    Leukopenia 05/04/2017   Bruit 01/22/2014   Muscle spasm of back 09/20/2013   COPD (chronic obstructive pulmonary disease) ([place]) 01/14/2011   CAD (coronary artery disease) 01/14/2011   GERD (gastroesophageal reflux disease) 01/14/2011   TOBACCO ABUSE 07/25/2009   Osteoarthritis 07/25/2009   ROTATOR CUFF SYNDROME, RIGHT 07/25/2009    Conditions to be addressed/monitored: CAD, COPD, and caregiver stress ; Mental Health Concerns , Limited access to caregiver, Cognitive Deficits, and Lacks knowledge of community resource:    Care Plan : LCSW Plan of Care  Updates made by Deirdre Peer, LCSW since 06/27/2021 12:00 AM     Problem: Limited Mobility and Independence due to physical and breathing barriers   Priority: High  Note:   Current barriers:   Patient in need of assistance with connecting to community resources for Financial constraints related to limited income , Level of care concerns, Limited access to caregiver, Cognitive Deficits, and Inability to perform ADL's independently Acknowledges  deficits with meeting this unmet need Patient is unable to independently navigate community resource options without care coordination support Clinical Goals:  explore community resource options for unmet needs related to:  Stress Clinical Interventions:  CSW made contact with pt and his wife, Lelon Frohlich. Per [name], he is "back to being his old self- the good Tim".  She shared that after she called the PCP's  office he changed has been acting right.  CSW advised wife that if he becomes dangerous to self or others, she can call 911. Also, suggested she might get him assessed by a Geriatric Psychiatrist or Neurologist for better assessment of his cognitive state. Per Lelon Frohlich, he has begun to slur his words and not make sense- CSW will ask PCP to consider making referral.   Collaboration with Dennard Schaumann Zachary Mcgee, MD regarding development and update of comprehensive plan of care as evidenced by provider attestation  and co-signature Inter-disciplinary care team collaboration (see longitudinal plan of care) Assessment of needs, barriers , agencies contacted, as well as how impacting  Review various resources, discussed options and provided patient information about Limited access to caregiver Collaborated with appropriate clinical care team members regarding patient needs Level of care concerns, Limited access to caregiver, Cognitive Deficits, and Inability to perform ADL's independently, COPD Patient interviewed and appropriate assessments performed Advised patient to call DSS regarding in-home aide program (eligibility/availability)  Provided education to patient/caregiver regarding level of care options. Other interventions provided: Solution-Focused Strategies  Active listening / Reflection utilized  Emotional Support Provided Caregiver stress acknowledged  Consideration of in-home help encouraged  Patient Goals:  -contact the DSS program discussed (email sent)  - begin a notebook of services in my neighborhood or community - call 211 when I need some help - follow-up on any referrals for help I am given - think ahead to make sure my need does not become an emergency - make a list of family or friends that I can call  -  Follow Up Plan: Appointment scheduled for SW follow up with client by phone on: 07/07/21       Follow Up Plan: Appointment scheduled for SW follow up with client by phone on: 07/07/21 Eduard Clos MSW, [place] Licensed Clinical Social Worker [place] [phone]

## 2021-06-27 NOTE — Patient Instructions (Signed)
Visit Information  (Copy and paste patient goals from clinical care plan here)  The patient verbalized understanding of instructions, educational materials, and care plan provided today and declined offer to receive copy of patient instructions, educational materials, and care plan.   Telephone follow up appointment with care management team member scheduled for:07/07/21  Eduard Clos MSW, [place] Licensed Clinical Social Worker [facility] [phone]

## 2021-07-07 ENCOUNTER — Telehealth: Payer: Medicare HMO

## 2021-07-08 ENCOUNTER — Telehealth: Payer: Self-pay | Admitting: *Deleted

## 2021-07-08 NOTE — Telephone Encounter (Signed)
  Care Management   Follow Up Note   07/08/2021 Name: Zachary Chambers MRN: [id number] DOB: October 16, 1950   Referred by: [name], MD Reason for referral : No chief complaint on file.   An unsuccessful telephone outreach was attempted today. The patient was referred to the case management team for assistance with care management and care coordination.   Follow Up Plan: The care management team will reach out to the patient again if no return call is received over the next 45 days.   Eduard Clos MSW, LCSW Licensed Clinical Social Worker [facility] [phone]

## 2021-07-15 ENCOUNTER — Other Ambulatory Visit: Payer: Self-pay | Admitting: Family Medicine

## 2021-07-16 DIAGNOSIS — I4891 Unspecified atrial fibrillation: Secondary | ICD-10-CM

## 2021-07-16 DIAGNOSIS — J449 Chronic obstructive pulmonary disease, unspecified: Secondary | ICD-10-CM

## 2021-07-21 IMAGING — CT CT ANGIO HEAD
2 of 4 series · 6 of 30 positions shown · IV contrast (APPLIED)
Comparison: MRI 08/29/2020

CLINICAL DATA: Right hand numbness. TIA suspected. Left
frontoparietal vertex infarction seen 1 month ago.

EXAM:
CT ANGIOGRAPHY HEAD
TECHNIQUE: Multidetector CT imaging of the head was performed using the
standard protocol during bolus administration of intravenous
contrast. Multiplanar CT image reconstructions and MIPs were
obtained to evaluate the vascular anatomy.
CONTRAST:  75mL W2HBD0-JME IOPAMIDOL (W2HBD0-JME) INJECTION 76%

[Series 3: head w/(date) · axial · 0.45mm/px · z∈[-138,-78]mm · 2 of 36 slices shown]
[im 12/36  brain]
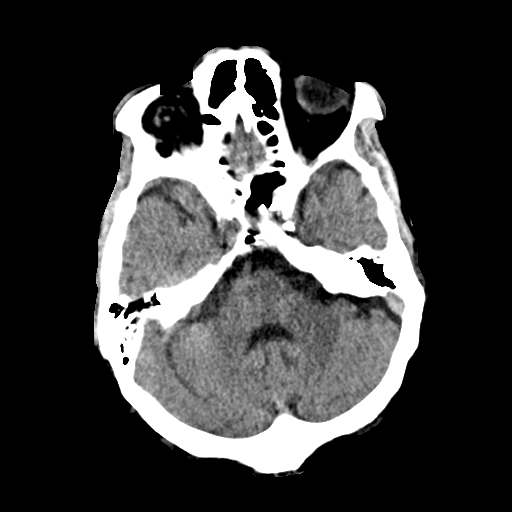
[im 24/36  brain]
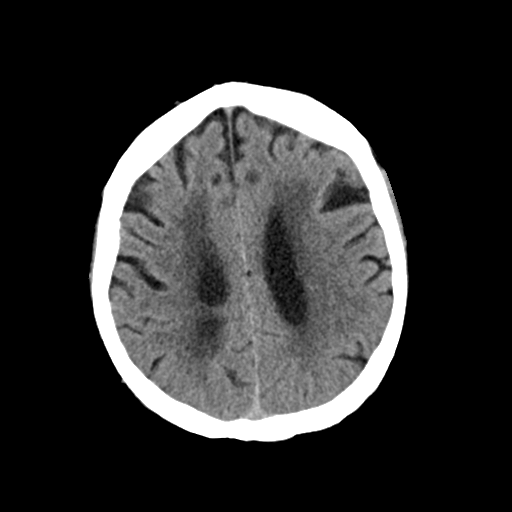

[Series 9: head angio · axial · 0.46mm/px · z∈[-162,-54]mm · 4 of 60 slices shown]
[im 12/60  brain]
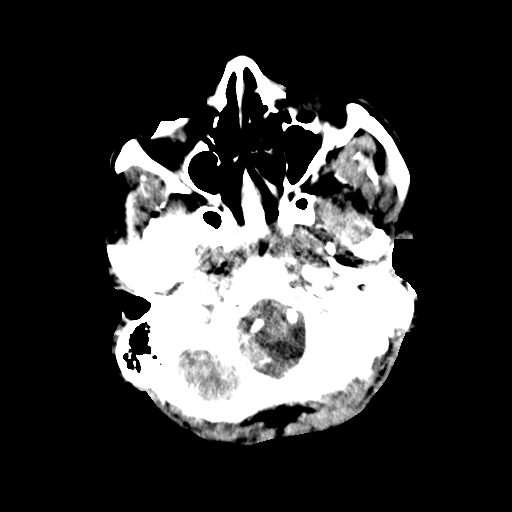
[im 24/60  bone]
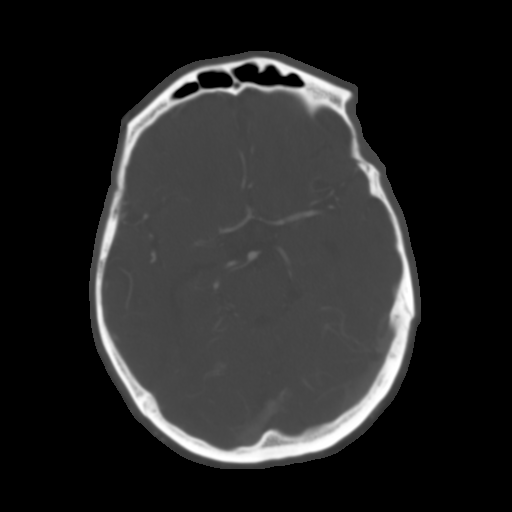
[im 36/60  brain]
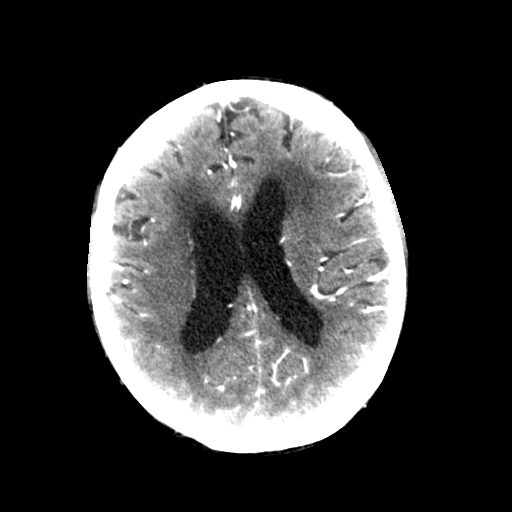
[im 48/60  bone]
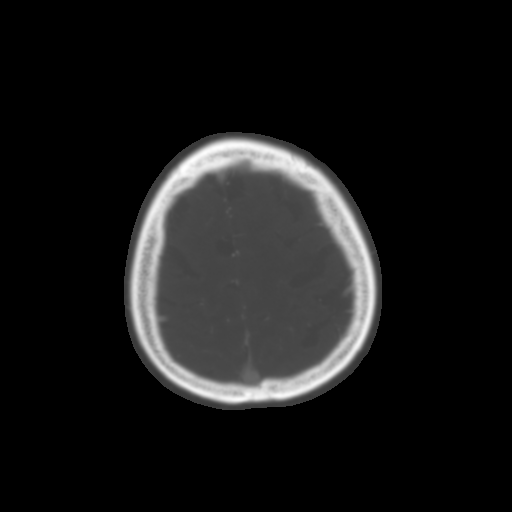

[6 of 30 positions shown; findings below may reference images not displayed]

FINDINGS: CT HEAD

Brain: Generalized atrophy. Chronic small-vessel ischemic changes of
the cerebral hemispheric white matter. Old left frontoparietal
vertex infarction visible as a small area of encephalomalacia by CT.
No sign of acute infarction, mass lesion, hemorrhage, hydrocephalus
or extra-axial collection.

Vascular: There is atherosclerotic calcification of the major
vessels at the base of the brain.

Skull: Negative

Sinuses: Clear

Orbits: Prosthetic globe on the right.

CTA HEAD

Anterior circulation: Both internal carotid arteries are patent
through the skull base and siphon regions. There is ordinary siphon
atherosclerotic calcification but no stenosis greater than 30%. The
anterior and middle cerebral vessels are patent without proximal
stenosis, aneurysm or vascular malformation. Focal calcification in
the right proximal M1 region but without stenosis. No visible large
or medium vessel occlusion.

Posterior circulation: Both vertebral arteries are patent to the
basilar. No basilar stenosis. Posterior circulation branch vessels
are patent without significant proximal stenosis or visible branch
vessel occlusion.

Venous sinuses: Patent and normal.

Anatomic variants: None significant.
IMPRESSION: 1. No acute finding by CT. Atrophy and chronic small-vessel ischemic
changes. Old left frontoparietal vertex infarction which was acute 1
month ago.
2. No intracranial large or medium vessel occlusion or correctable
proximal stenosis.

## 2021-07-25 ENCOUNTER — Ambulatory Visit: Payer: Medicare HMO

## 2021-08-01 ENCOUNTER — Other Ambulatory Visit: Payer: Self-pay

## 2021-08-01 ENCOUNTER — Ambulatory Visit (INDEPENDENT_AMBULATORY_CARE_PROVIDER_SITE_OTHER): Payer: Medicare HMO

## 2021-08-01 VITALS — Ht 68.0 in | Wt 125.0 lb

## 2021-08-01 DIAGNOSIS — Z5941 Food insecurity: Secondary | ICD-10-CM

## 2021-08-01 DIAGNOSIS — Z1211 Encounter for screening for malignant neoplasm of colon: Secondary | ICD-10-CM | POA: Diagnosis not present

## 2021-08-01 DIAGNOSIS — Z0000 Encounter for general adult medical examination without abnormal findings: Secondary | ICD-10-CM | POA: Diagnosis not present

## 2021-08-01 NOTE — Patient Instructions (Signed)
Zachary Chambers , Thank you for taking time to come for your Medicare Wellness Visit. I appreciate your ongoing commitment to your health goals. Please review the following plan we discussed and let me know if I can assist you in the future.   Screening recommendations/referrals: Colonoscopy: Done 03/09/2016. Order placed today, to schedule for after first of year.  Recommended yearly ophthalmology/optometry visit for glaucoma screening and checkup Recommended yearly dental visit for hygiene and checkup  Vaccinations: Influenza vaccine: Declined. Pneumococcal vaccine: Done 08/15/2014 and 08/26/2016 Tdap vaccine: Done 08/05/2020 Repeat in 10 years  Shingles vaccine: Done 03/07/2018 and 06/15/2018    Covid-19: Declined  Advanced directives: Please bring a copy of your health care power of attorney and living will to the office to be added to your chart at your convenience.   Conditions/risks identified: Aim for 30 minutes of exercise or walking each day, drink 6-8 glasses of water and eat lots of fruits and vegetables.   Next appointment: Follow up in one year for your annual wellness visit. 2023.  Preventive Care 90 Years and Older, Male  Preventive care refers to lifestyle choices and visits with your health care provider that can promote health and wellness. What does preventive care include? A yearly physical exam. This is also called an annual well check. Dental exams once or twice a year. Routine eye exams. Ask your health care provider how often you should have your eyes checked. Personal lifestyle choices, including: Daily care of your teeth and gums. Regular physical activity. Eating a healthy diet. Avoiding tobacco and drug use. Limiting alcohol use. Practicing safe sex. Taking low doses of aspirin every day. Taking vitamin and mineral supplements as recommended by your health care provider. What happens during an annual well check? The services and screenings done by your  health care provider during your annual well check will depend on your age, overall health, lifestyle risk factors, and family history of disease. Counseling  Your health care provider may ask you questions about your: Alcohol use. Tobacco use. Drug use. Emotional well-being. Home and relationship well-being. Sexual activity. Eating habits. History of falls. Memory and ability to understand (cognition). Work and work Statistician. Screening  You may have the following tests or measurements: Height, weight, and BMI. Blood pressure. Lipid and cholesterol levels. These may be checked every 5 years, or more frequently if you are over 69 years old. Skin check. Lung cancer screening. You may have this screening every year starting at age 38 if you have a 30-pack-year history of smoking and currently smoke or have quit within the past 15 years. Fecal occult blood test (FOBT) of the stool. You may have this test every year starting at age 18. Flexible sigmoidoscopy or colonoscopy. You may have a sigmoidoscopy every 5 years or a colonoscopy every 10 years starting at age 59. Prostate cancer screening. Recommendations will vary depending on your family history and other risks. Hepatitis C blood test. Hepatitis B blood test. Sexually transmitted disease (STD) testing. Diabetes screening. This is done by checking your blood sugar (glucose) after you have not eaten for a while (fasting). You may have this done every 1-3 years. Abdominal aortic aneurysm (AAA) screening. You may need this if you are a current or former smoker. Osteoporosis. You may be screened starting at age 2 if you are at high risk. Talk with your health care provider about your test results, treatment options, and if necessary, the need for more tests. Vaccines  Your health care provider  may recommend certain vaccines, such as: Influenza vaccine. This is recommended every year. Tetanus, diphtheria, and acellular pertussis  (Tdap, Td) vaccine. You may need a Td booster every 10 years. Zoster vaccine. You may need this after age 56. Pneumococcal 13-valent conjugate (PCV13) vaccine. One dose is recommended after age 69. Pneumococcal polysaccharide (PPSV23) vaccine. One dose is recommended after age 59. Talk to your health care provider about which screenings and vaccines you need and how often you need them. This information is not intended to replace advice given to you by your health care provider. Make sure you discuss any questions you have with your health care provider. Document Released: 08/30/2015 Document Revised: 04/22/2016 Document Reviewed: 06/04/2015 Elsevier Interactive Patient Education  2017 [place] Prevention in the Home Falls can cause injuries. They can happen to people of all ages. There are many things you can do to make your home safe and to help prevent falls. What can I do on the outside of my home? Regularly fix the edges of walkways and driveways and fix any cracks. Remove anything that might make you trip as you walk through a door, such as a raised step or threshold. Trim any bushes or trees on the path to your home. Use bright outdoor lighting. Clear any walking paths of anything that might make someone trip, such as rocks or tools. Regularly check to see if handrails are loose or broken. Make sure that both sides of any steps have handrails. Any raised decks and porches should have guardrails on the edges. Have any leaves, snow, or ice cleared regularly. Use sand or salt on walking paths during winter. Clean up any spills in your garage right away. This includes oil or grease spills. What can I do in the bathroom? Use night lights. Install grab bars by the toilet and in the tub and shower. Do not use towel bars as grab bars. Use non-skid mats or decals in the tub or shower. If you need to sit down in the shower, use a plastic, non-slip stool. Keep the floor dry. Clean  up any water that spills on the floor as soon as it happens. Remove soap buildup in the tub or shower regularly. Attach bath mats securely with double-sided non-slip rug tape. Do not have throw rugs and other things on the floor that can make you trip. What can I do in the bedroom? Use night lights. Make sure that you have a light by your bed that is easy to reach. Do not use any sheets or blankets that are too big for your bed. They should not hang down onto the floor. Have a firm chair that has side arms. You can use this for support while you get dressed. Do not have throw rugs and other things on the floor that can make you trip. What can I do in the kitchen? Clean up any spills right away. Avoid walking on wet floors. Keep items that you use a lot in easy-to-reach places. If you need to reach something above you, use a strong step stool that has a grab bar. Keep electrical cords out of the way. Do not use floor polish or wax that makes floors slippery. If you must use wax, use non-skid floor wax. Do not have throw rugs and other things on the floor that can make you trip. What can I do with my stairs? Do not leave any items on the stairs. Make sure that there are handrails on both sides  of the stairs and use them. Fix handrails that are broken or loose. Make sure that handrails are as long as the stairways. Check any carpeting to make sure that it is firmly attached to the stairs. Fix any carpet that is loose or worn. Avoid having throw rugs at the top or bottom of the stairs. If you do have throw rugs, attach them to the floor with carpet tape. Make sure that you have a light switch at the top of the stairs and the bottom of the stairs. If you do not have them, ask someone to add them for you. What else can I do to help prevent falls? Wear shoes that: Do not have high heels. Have rubber bottoms. Are comfortable and fit you well. Are closed at the toe. Do not wear sandals. If you  use a stepladder: Make sure that it is fully opened. Do not climb a closed stepladder. Make sure that both sides of the stepladder are locked into place. Ask someone to hold it for you, if possible. Clearly mark and make sure that you can see: Any grab bars or handrails. First and last steps. Where the edge of each step is. Use tools that help you move around (mobility aids) if they are needed. These include: Canes. Walkers. Scooters. Crutches. Turn on the lights when you go into a dark area. Replace any light bulbs as soon as they burn out. Set up your furniture so you have a clear path. Avoid moving your furniture around. If any of your floors are uneven, fix them. If there are any pets around you, be aware of where they are. Review your medicines with your doctor. Some medicines can make you feel dizzy. This can increase your chance of falling. Ask your doctor what other things that you can do to help prevent falls. This information is not intended to replace advice given to you by your health care provider. Make sure you discuss any questions you have with your health care provider. Document Released: 05/30/2009 Document Revised: 01/09/2016 Document Reviewed: 09/07/2014 Elsevier Interactive Patient Education  2017 [employer].

## 2021-08-01 NOTE — Progress Notes (Signed)
Subjective:   Zachary Chambers is a 70 y.o. male who presents for Medicare Annual/Subsequent preventive examination. Virtual Visit via Telephone Note  I connected with  Trevan T Mcgivern on 08/01/21 at  2:00 PM EST by telephone and verified that I am speaking with the correct person using two identifiers.  Location: Patient: Home Provider: [facility] Persons participating in the virtual visit: patient/Nurse Health Advisor   I discussed the limitations, risks, security and privacy concerns of performing an evaluation and management service by telephone and the availability of in person appointments. The patient expressed understanding and agreed to proceed.  Interactive audio and video telecommunications were attempted between this nurse and patient, however failed, due to patient having technical difficulties OR patient did not have access to video capability.  We continued and completed visit with audio only.  Some vital signs may be absent or patient reported.   Chriss Driver, LPN  Review of Systems     Cardiac Risk Factors include: advanced age (>86men, >89 women);hypertension;dyslipidemia;male gender;sedentary lifestyle     Objective:    Today's Vitals   08/01/21 1422  Weight: 125 lb (56.7 kg)  Height: 5\' 8"  (1.727 m)   Body mass index is 19.01 kg/m.  Advanced Directives 08/01/2021 04/28/2021 04/19/2021 04/19/2021 03/12/2021 03/11/2021 01/17/2021  Does Patient Have a Medical Advance Directive? Yes Yes Yes Yes No No Yes  Type of Paramedic of [place];Living will [place];Living will Living will;Healthcare Power of [place];Living will - - [place];Living will  Does patient want to make changes to medical advance directive? - - No - Patient declined No - Patient declined - - No - Patient declined  Copy of [place] in Chart? No - copy requested No - copy requested No - copy  requested No - copy requested - - No - copy requested  Would patient like information on creating a medical advance directive? No - Patient declined No - Patient declined No - Guardian declined No - Guardian declined No - Patient declined No - Patient declined -    Current Medications (verified) Outpatient Encounter Medications as of 08/01/2021  Medication Sig   albuterol (PROVENTIL) (2.5 MG/3ML) 0.083% nebulizer solution Take 3 mLs (2.5 mg total) by nebulization every 4 (four) hours as needed for wheezing or shortness of breath.   albuterol (VENTOLIN HFA) 108 (90 Base) MCG/ACT inhaler INHALE 2 PUFFS INTO THE LUNGS EVERY 4 HOURS AS NEEDED FOR WHEEZING AND SHORTNESS OF BREATH   aspirin 81 MG tablet Take 81 mg by mouth at bedtime.   atorvastatin (LIPITOR) 40 MG tablet Take 1 tablet (40 mg total) by mouth at bedtime.   benzonatate (TESSALON PERLES) 100 MG capsule Take 1 capsule (100 mg total) by mouth 3 (three) times daily as needed for cough.   Budeson-Glycopyrrol-Formoterol (BREZTRI AEROSPHERE) 160-9-4.8 MCG/ACT AERO Inhale 2 puffs into the lungs 2 (two) times daily.   cephALEXin (KEFLEX) 500 MG capsule Take 1 capsule (500 mg total) by mouth 3 (three) times daily.   Cholecalciferol (D3 PO) Take 1 tablet by mouth daily.   Cyanocobalamin (VITAMIN B-12 PO) Take 1 tablet by mouth daily.   ELIQUIS 5 MG TABS tablet TAKE 1 TABLET BY MOUTH 2 TIMES DAILY   fluticasone (FLONASE) 50 MCG/ACT nasal spray Place 2 sprays into both nostrils daily.   folic acid (FOLVITE) 1 MG tablet TAKE 1 TABLET BY MOUTH EVERY DAY   isosorbide mononitrate (IMDUR)  30 MG 24 hr tablet Take 0.5 tablets (15 mg total) by mouth daily.   LORazepam (ATIVAN) 0.5 MG tablet Take 1 tablet (0.5 mg total) by mouth 2 (two) times daily as needed for anxiety.   magnesium oxide (MAG-OX) 400 (240 Mg) MG tablet Take 1 tablet (400 mg total) by mouth daily.   metoprolol tartrate (LOPRESSOR) 25 MG tablet Take 1 tablet (25 mg total) by mouth 2 (two)  times daily.   Multiple Vitamin (MULTIVITAMIN WITH MINERALS) TABS tablet Take 1 tablet by mouth daily.   nitroGLYCERIN (NITROSTAT) 0.4 MG SL tablet DISSOLVE 1 TABLET UNDER THE TONGUE EVERY 5 MINUTES AS NEEDED FOR CHEST PAIN   omeprazole (PRILOSEC) 40 MG capsule Take 1 capsule (40 mg total) by mouth daily.   predniSONE (DELTASONE) 20 MG tablet 3 tabs poqday 1-2, 2 tabs poqday 3-4, 1 tab poqday 5-6   QUEtiapine (SEROQUEL) 25 MG tablet Take 1 tablet (25 mg total) by mouth at bedtime.   silver sulfADIAZINE (SILVADENE) 1 % cream Apply 1 application topically daily.   thiamine 100 MG tablet Take 5 tablets (500 mg total) by mouth daily.   traMADol (ULTRAM) 50 MG tablet TAKE 1 TABLET BY MOUTH EVERY 6 HOURS AS NEEDED FOR moderate PAIN   zinc gluconate 50 MG tablet Take 50 mg by mouth daily.   Blood Glucose Calibration (TRUE METRIX LEVEL 1) Low SOLN USE AS DIRECTED. To monitor fsbs 2 times per day dx e11.9 (Patient not taking: Reported on 08/01/2021)   Blood Glucose Monitoring Suppl (TRUE METRIX AIR GLUCOSE METER) DEVI USE AS DIRECTED. To monitor fsbs 2 times per day dx e11.9 (Patient not taking: Reported on 08/01/2021)   glucose blood (RELION TRUE METRIX TEST STRIPS) test strip USE AS DIRECTED. To monitor fsbs 2 times per day dx e11.9 (Patient not taking: Reported on 08/01/2021)   potassium chloride SA (KLOR-CON) 20 MEQ tablet Take 1 tablet (20 mEq total) by mouth daily for 4 days.   No facility-administered encounter medications on file as of 08/01/2021.    Allergies (verified) Patient has no known allergies.   History: Past Medical History:  Diagnosis Date   Alcohol abuse    Aortic atherosclerosis (HCC)    CAD in native artery    Chronic respiratory failure (HCC)    COPD (chronic obstructive pulmonary disease) (HCC)    DJD (degenerative joint disease)    diffusely   GERD (gastroesophageal reflux disease)    Hyperlipidemia    On home O2    Paroxysmal atrial fibrillation (HCC)     Pericardial effusion    Pneumonia due to COVID-19 virus    PUD (peptic ulcer disease)    with bleeding   Rotator cuff disorder    has been evaluated by Dr Clifton James and [facility]   Stroke (cerebrum) [facility])    Past Surgical History:  Procedure Laterality Date   COLONOSCOPY     EYE SURGERY     right eye removed at age 29   heart stent     x3- 2006   Family History  Problem Relation Age of Onset   Ovarian cancer Mother    Diabetes Maternal Grandfather    COPD Father    Diabetes Paternal Grandfather    Colon cancer Paternal Grandfather    Esophageal cancer Neg Hx    Rectal cancer Neg Hx    Stomach cancer Neg Hx    Social History   Socioeconomic History   Marital status: Single    Spouse name: Not  on file   Number of children: Not on file   Years of education: Not on file   Highest education level: Not on file  Occupational History   Occupation: retired  Tobacco Use   Smoking status: Former    Packs/day: 1.00    Years: 40.00    Pack years: 40.00    Types: Cigarettes   Smokeless tobacco: Never   Tobacco comments:    declines patch  Vaping Use   Vaping Use: Never used  Substance and Sexual Activity   Alcohol use: Yes    Alcohol/week: 30.0 standard drinks    Types: 30 Cans of beer per week    Comment: h/o heavy use, now 4 beers per day   Drug use: Not Currently   Sexual activity: Yes  Other Topics Concern   Not on file  Social History Narrative   Right Handed   Lives in one story home       Patient has not smoked for 3 months   Patient on 2L of Oxygen    Social Determinants of Health   Financial Resource Strain: Medium Risk   Difficulty of Paying Living Expenses: Somewhat hard  Food Insecurity: Food Insecurity Present   Worried About Running Out of Food in the Last Year: Sometimes true   Ran Out of Food in the Last Year: Sometimes true  Transportation Needs: No Transportation Needs   Lack of Transportation (Medical): No   Lack of Transportation  (Non-Medical): No  Physical Activity: Insufficiently Active   Days of Exercise per Week: 3 days   Minutes of Exercise per Session: 20 min  Stress: Stress Concern Present   Feeling of Stress : To some extent  Social Connections: Engineer, building services of Communication with Friends and Family: More than three times a week   Frequency of Social Gatherings with Friends and Family: Once a week   Attends Religious Services: 1 to 4 times per year   Active Member of [employer] or Organizations: No   Attends Music therapist: 1 to 4 times per year   Marital Status: Living with partner    Tobacco Counseling Counseling given: Not Answered Tobacco comments: declines patch   Clinical Intake:  Pre-visit preparation completed: Yes  Pain : No/denies pain     BMI - recorded: 19.01 Nutritional Status: BMI of 19-24  Normal Nutritional Risks: None Diabetes: No  How often do you need to have someone help you when you read instructions, pamphlets, or other written materials from your doctor or pharmacy?: 1 - Never  Diabetic?No, not currently on medications. Patient states he does not check his blood sugars.  Interpreter Needed?: No  Information entered by :: mj Arick Mareno, lpn   Activities of Daily Living In your present state of health, do you have any difficulty performing the following activities: 08/01/2021 04/19/2021  Hearing? N N  Vision? N N  Difficulty concentrating or making decisions? Y N  Comment Memory issues. -  Walking or climbing stairs? Tempie Donning  Comment Pt currently on oxygen. -  Dressing or bathing? N Y  Doing errands, shopping? N Y  Conservation officer, nature and eating ? N -  Using the Toilet? N -  In the past six months, have you accidently leaked urine? Y -  Do you have problems with loss of bowel control? N -  Managing your Medications? N -  Managing your Finances? N -  Housekeeping or managing your Housekeeping? Y -  Comment Partner, Lelon Frohlich,  handles housekeeping  due to patient's COPD. -  Some recent data might be hidden    Patient Care Team: [name], MD as PCP - General (Family Medicine) Josue Hector, MD as PCP - Cardiology (Cardiology) Edythe Clarity, [facility] as Pharmacist (Pharmacist) Pieter Partridge, DO as Consulting Physician (Neurology) Deirdre Peer, LCSW as Social Worker (Licensed Clinical Social Worker)  Indicate any recent [employer] you may have received from other than [facility] providers in the past year (date may be approximate).     Assessment:   This is a routine wellness examination for Beverley.  Hearing/Vision screen Hearing Screening - Comments:: No hearing issues.  Vision Screening - Comments:: Glasses. [employer]. 2022.  Dietary issues and exercise activities discussed: Current Exercise Habits: Home exercise routine, Type of exercise: walking, Time (Minutes): 20, Frequency (Times/Week): 3, Weekly Exercise (Minutes/Week): 60, Intensity: Mild, Exercise limited by: cardiac condition(s);respiratory conditions(s)   Goals Addressed             This Visit's Progress    Exercise 3x per week (30 min per time)         Depression Screen PHQ 2/9 Scores 08/01/2021 01/17/2021 11/06/2019 07/07/2019 02/01/2019 03/04/2018 09/03/2017  PHQ - 2 Score 0 0 0 0 0 1 0  PHQ- 9 Score - - - - - 3 1    Fall Risk Fall Risk  08/01/2021 09/12/2020 11/06/2019 07/07/2019 02/01/2019  Falls in the past year? 0 0 0 0 0  Number falls in past yr: 0 0 - - -  Injury with Fall? 0 0 - - -  Risk for fall due to : Other (Comment);Impaired balance/gait - No Fall Risks - -  Risk for fall due to: Comment Currently on oxygen. - - - -  Follow up Falls prevention discussed - Falls evaluation completed Falls evaluation completed Falls evaluation completed    FALL RISK PREVENTION PERTAINING TO THE HOME:  Any stairs in or around the home? Yes  If so, are there any without handrails? No  Home free of loose throw rugs in walkways, pet beds,  electrical cords, etc? Yes  Adequate lighting in your home to reduce risk of falls? Yes   ASSISTIVE DEVICES UTILIZED TO PREVENT FALLS:  Life alert? No  Use of a cane, walker or w/c? Yes  Grab bars in the bathroom? No  Shower chair or bench in shower? Yes  Elevated toilet seat or a handicapped toilet? Yes   TIMED UP AND GO:  Was the test performed? No .  Phone visit  Cognitive Function:     6CIT Screen 08/01/2021  What Year? 0 points  What month? 0 points  What time? 0 points  Count back from 20 2 points  Months in reverse 0 points  Repeat phrase 6 points  Total Score 8    Immunizations Immunization History  Administered Date(s) Administered   Fluad Quad(high Dose 65+) 07/07/2019, 08/05/2020   Influenza Whole 07/25/2009   Influenza, High Dose Seasonal PF 09/03/2017, 08/05/2018   Influenza,inj,Quad PF,6+ Mos 06/19/2013, 08/15/2014, 07/17/2015, 07/15/2016   Pneumococcal Conjugate-13 08/15/2014   Pneumococcal Polysaccharide-23 08/26/2016   Tdap 04/28/2021   Zoster Recombinat (Shingrix) 03/07/2018, 06/15/2018    TDAP status: Up to date  Flu Vaccine status: Due, Education has been provided regarding the importance of this vaccine. Advised may receive this vaccine at local pharmacy or Health Dept. Aware to provide a copy of the vaccination record if obtained from local pharmacy or Health Dept. Verbalized acceptance and  understanding.  Pneumococcal vaccine status: Up to date  Covid-19 vaccine status: Declined, Education has been provided regarding the importance of this vaccine but patient still declined. Advised may receive this vaccine at local pharmacy or Health Dept.or vaccine clinic. Aware to provide a copy of the vaccination record if obtained from local pharmacy or Health Dept. Verbalized acceptance and understanding.  Qualifies for Shingles Vaccine? Yes   Zostavax completed No   Shingrix Completed?: Yes  Screening Tests Health Maintenance  Topic Date Due    COVID-19 Vaccine (1) Never done   COLONOSCOPY (Pts 45-29yrs Insurance coverage will need to be confirmed)  03/09/2021   INFLUENZA VACCINE  03/17/2021   TETANUS/TDAP  04/29/2031   Pneumonia Vaccine 48+ Years old  Completed   Hepatitis C Screening  Completed   Zoster Vaccines- Shingrix  Completed   HPV VACCINES  Aged Out    Health Maintenance  Health Maintenance Due  Topic Date Due   COVID-19 Vaccine (1) Never done   COLONOSCOPY (Pts 45-56yrs Insurance coverage will need to be confirmed)  03/09/2021   INFLUENZA VACCINE  03/17/2021    Colorectal cancer screening: Type of screening: Colonoscopy. Completed 03/09/2016. Repeat every 5 years  Lung Cancer Screening: (Low Dose CT Chest recommended if Age 58-80 years, 30 pack-year currently smoking OR have quit w/in 15years.) does not qualify.    Additional Screening:  Hepatitis C Screening: does qualify; Completed 05/04/2017  Vision Screening: Recommended annual ophthalmology exams for early detection of glaucoma and other disorders of the eye. Is the patient up to date with their annual eye exam?  Yes  Who is the provider or what is the name of the office in which the patient attends annual eye exams? [facility] If pt is not established with a provider, would they like to be referred to a provider to establish care? No .   Dental Screening: Recommended annual dental exams for proper oral hygiene  Community Resource Referral / Chronic Care Management: CRR required this visit?  Yes   CCM required this visit?  No      Plan:     I have personally reviewed and noted the following in the patients chart:   Medical and social history Use of alcohol, tobacco or illicit drugs  Current medications and supplements including opioid prescriptions. Patient is not currently taking opioid prescriptions. Functional ability and status Nutritional status Physical activity Advanced directives List of other physicians Hospitalizations,  surgeries, and ER visits in previous 12 months Vitals Screenings to include cognitive, depression, and falls Referrals and appointments  In addition, I have reviewed and discussed with patient certain preventive protocols, quality metrics, and best practice recommendations. A written personalized care plan for preventive services as well as general preventive health recommendations were provided to patient.     Chriss Driver, LPN   03/30/4817   Nurse Notes: Pt c/o issues with being able to afford food. Discussed CRR referral and pt is agreeable. Referral placed. Pt due for Colonoscopy. Order placed to schedule after first of year and patient is agreeable. Discussed Flu and Covid vaccines and how to obtain.  6CIT score of 10.

## 2021-08-12 ENCOUNTER — Ambulatory Visit (INDEPENDENT_AMBULATORY_CARE_PROVIDER_SITE_OTHER): Payer: Medicare HMO | Admitting: *Deleted

## 2021-08-12 DIAGNOSIS — I6930 Unspecified sequelae of cerebral infarction: Secondary | ICD-10-CM

## 2021-08-12 DIAGNOSIS — I2510 Atherosclerotic heart disease of native coronary artery without angina pectoris: Secondary | ICD-10-CM

## 2021-08-12 DIAGNOSIS — J449 Chronic obstructive pulmonary disease, unspecified: Secondary | ICD-10-CM

## 2021-08-12 DIAGNOSIS — I25110 Atherosclerotic heart disease of native coronary artery with unstable angina pectoris: Secondary | ICD-10-CM

## 2021-08-14 NOTE — Chronic Care Management (AMB) (Signed)
Chronic Care Management    Clinical Social Work Note  08/14/2021 Name: Zachary Chambers MRN: [id number] DOB: 1950-09-12  Zachary Chambers is a 70 y.o. year old male who is a primary care patient of Pickard, Cammie Mcgee, MD. The CCM team was consulted to assist the patient with chronic disease management and/or care coordination needs related to: [employer]  and Caregiver Stress.   Engaged with patient by telephone for follow up visit in response to provider referral for social work chronic care management and care coordination services.   Consent to Services:  The patient was given information about Chronic Care Management services, agreed to services, and gave verbal consent prior to initiation of services.  Please see initial visit note for detailed documentation.   Patient agreed to services and consent obtained.   Assessment: Review of patient past medical history, allergies, medications, and health status, including review of relevant consultants reports was performed today as part of a comprehensive evaluation and provision of chronic care management and care coordination services.     SDOH (Social Determinants of Health) assessments and interventions performed:    Advanced Directives Status: Not addressed in this encounter.  CCM Care Plan  No Known Allergies  Outpatient Encounter Medications as of 08/12/2021  Medication Sig Note   albuterol (PROVENTIL) (2.5 MG/3ML) 0.083% nebulizer solution Take 3 mLs (2.5 mg total) by nebulization every 4 (four) hours as needed for wheezing or shortness of breath.    albuterol (VENTOLIN HFA) 108 (90 Base) MCG/ACT inhaler INHALE 2 PUFFS INTO THE LUNGS EVERY 4 HOURS AS NEEDED FOR WHEEZING AND SHORTNESS OF BREATH    aspirin 81 MG tablet Take 81 mg by mouth at bedtime.    atorvastatin (LIPITOR) 40 MG tablet Take 1 tablet (40 mg total) by mouth at bedtime.    benzonatate (TESSALON PERLES) 100 MG capsule Take 1 capsule (100 mg total) by mouth 3 (three)  times daily as needed for cough.    Blood Glucose Calibration (TRUE METRIX LEVEL 1) Low SOLN USE AS DIRECTED. To monitor fsbs 2 times per day dx e11.9 (Patient not taking: Reported on 08/01/2021)    Blood Glucose Monitoring Suppl (TRUE METRIX AIR GLUCOSE METER) DEVI USE AS DIRECTED. To monitor fsbs 2 times per day dx e11.9 (Patient not taking: Reported on 08/01/2021)    Budeson-Glycopyrrol-Formoterol (BREZTRI AEROSPHERE) 160-9-4.8 MCG/ACT AERO Inhale 2 puffs into the lungs 2 (two) times daily.    cephALEXin (KEFLEX) 500 MG capsule Take 1 capsule (500 mg total) by mouth 3 (three) times daily.    Cholecalciferol (D3 PO) Take 1 tablet by mouth daily.    Cyanocobalamin (VITAMIN B-12 PO) Take 1 tablet by mouth daily. 04/19/2021: Pt is not sure about this medication   ELIQUIS 5 MG TABS tablet TAKE 1 TABLET BY MOUTH 2 TIMES DAILY    fluticasone (FLONASE) 50 MCG/ACT nasal spray Place 2 sprays into both nostrils daily.    folic acid (FOLVITE) 1 MG tablet TAKE 1 TABLET BY MOUTH EVERY DAY    glucose blood (RELION TRUE METRIX TEST STRIPS) test strip USE AS DIRECTED. To monitor fsbs 2 times per day dx e11.9 (Patient not taking: Reported on 08/01/2021)    isosorbide mononitrate (IMDUR) 30 MG 24 hr tablet Take 0.5 tablets (15 mg total) by mouth daily.    LORazepam (ATIVAN) 0.5 MG tablet Take 1 tablet (0.5 mg total) by mouth 2 (two) times daily as needed for anxiety.    magnesium oxide (MAG-OX) 400 (240 Mg) MG  tablet Take 1 tablet (400 mg total) by mouth daily.    metoprolol tartrate (LOPRESSOR) 25 MG tablet Take 1 tablet (25 mg total) by mouth 2 (two) times daily.    Multiple Vitamin (MULTIVITAMIN WITH MINERALS) TABS tablet Take 1 tablet by mouth daily. 03/12/2021: Centrum for men   nitroGLYCERIN (NITROSTAT) 0.4 MG SL tablet DISSOLVE 1 TABLET UNDER THE TONGUE EVERY 5 MINUTES AS NEEDED FOR CHEST PAIN    omeprazole (PRILOSEC) 40 MG capsule Take 1 capsule (40 mg total) by mouth daily.    predniSONE (DELTASONE) 20 MG  tablet 3 tabs poqday 1-2, 2 tabs poqday 3-4, 1 tab poqday 5-6    QUEtiapine (SEROQUEL) 25 MG tablet Take 1 tablet (25 mg total) by mouth at bedtime.    silver sulfADIAZINE (SILVADENE) 1 % cream Apply 1 application topically daily.    thiamine 100 MG tablet Take 5 tablets (500 mg total) by mouth daily.    traMADol (ULTRAM) 50 MG tablet TAKE 1 TABLET BY MOUTH EVERY 6 HOURS AS NEEDED FOR moderate PAIN    zinc gluconate 50 MG tablet Take 50 mg by mouth daily.    No facility-administered encounter medications on file as of 08/12/2021.    Patient Active Problem List   Diagnosis Date Noted   Primary hypertension    Depression with anxiety    ACS (acute coronary syndrome) ([place]) 40/98/1191   Metabolic encephalopathy 47/82/9562   Hypokalemia 13/03/6577   Acute metabolic encephalopathy 46/96/2952   Hypoxemia 12/10/2020   COPD exacerbation ([place]) 12/09/2020   Acute on chronic respiratory failure with hypoxia ([place]) 12/09/2020   Demand ischemia ([place]) 12/09/2020   Anxiety 10/07/2020   History of CVA with residual deficit 10/07/2020   Pneumonia due to COVID-19 virus 07/05/2020   Chronic respiratory failure ([place]) 07/05/2020   Transient atrial fibrillation ([place]) 07/05/2020   Dyspnea 07/04/2020   Protein-calorie malnutrition, severe 03/07/2020   S/P coronary artery stent placement    Chest pain 03/06/2020   Hyperlipidemia 03/06/2020   Healthcare maintenance 03/05/2020   Heavy alcohol use 03/04/2018   Memory changes 03/04/2018   Leukopenia 05/04/2017   Bruit 01/22/2014   Muscle spasm of back 09/20/2013   COPD (chronic obstructive pulmonary disease) ([place]) 01/14/2011   CAD (coronary artery disease) 01/14/2011   GERD (gastroesophageal reflux disease) 01/14/2011   TOBACCO ABUSE 07/25/2009   Osteoarthritis 07/25/2009   ROTATOR CUFF SYNDROME, RIGHT 07/25/2009    Conditions to be addressed/monitored: Atrial Fibrillation and COPD; Lacks knowledge of community resource:    Care Plan : LCSW Plan of  Care  Updates made by Deirdre Peer, LCSW since 08/14/2021 12:00 AM     Problem: Limited Mobility and Independence due to physical and breathing barriers Resolved 08/14/2021  Priority: High  Note:   Current barriers:   Patient in need of assistance with connecting to community resources for Financial constraints related to limited income , Level of care concerns, Limited access to caregiver, Cognitive Deficits, and Inability to perform ADL's independently Acknowledges deficits with meeting this unmet need Patient is unable to independently navigate community resource options without care coordination support Clinical Goals:  explore community resource options for unmet needs related to:  Stress Clinical Interventions:  CSW made contact with pt and his wife, Lelon Frohlich. Per [name], he is "back to being his old self- the good Tim".  She shared that after she called the PCP's  office he changed has been acting right. She declines any further CSW needs at this time and will request  PCP re-consult if needs do arise.    Collaboration with [name], MD regarding development and update of comprehensive plan of care as evidenced by provider attestation and co-signature Inter-disciplinary care team collaboration (see longitudinal plan of care) Assessment of needs, barriers , agencies contacted, as well as how impacting  Review various resources, discussed options and provided patient information about Limited access to caregiver Collaborated with appropriate clinical care team members regarding patient needs Level of care concerns, Limited access to caregiver, Cognitive Deficits, and Inability to perform ADL's independently, COPD Patient interviewed and appropriate assessments performed Advised patient to call DSS regarding in-home aide program (eligibility/availability)  Provided education to patient/caregiver regarding level of care options. Other interventions provided: Solution-Focused  Strategies  Active listening / Reflection utilized  Emotional Support Provided Caregiver stress acknowledged  Consideration of in-home help encouraged  Patient Goals:  -contact the DSS program discussed (email sent)  - begin a notebook of services in my neighborhood or community - call 211 when I need some help - follow-up on any referrals for help I am given - think ahead to make sure my need does not become an emergency - make a list of family or friends that I can call  -  Follow Up Plan: None planned at this time.       Follow Up Plan:  Client/family will advise PCP if further CSW needs arise      Eduard Clos MSW, LCSW Licensed Clinical Social Worker [place] [phone]

## 2021-08-14 NOTE — Patient Instructions (Signed)
Visit Information  Thank you for taking time to visit with me today. Please don't hesitate to contact me if I can be of assistance to you   Please call the care guide team at [phone] if you need to cancel or reschedule your appointment.   If you are experiencing a Mental Health or [place] or need someone to talk to, please call the Suicide and Crisis Lifeline: 988 call the [place] National Suicide Prevention Lifeline: (902)073-7642 or TTY: 858 293 2614 TTY (657)111-8615) to talk to a trained counselor call 1-800-273-TALK (toll free, 24 hour hotline) call 911   The patient verbalized understanding of instructions, educational materials, and care plan provided today and declined offer to receive copy of patient instructions, educational materials, and care plan.  Eduard Clos MSW, LCSW Licensed Clinical Social Worker [facility] [phone]

## 2021-08-16 ENCOUNTER — Other Ambulatory Visit: Payer: Self-pay | Admitting: Family Medicine

## 2021-08-16 DIAGNOSIS — J449 Chronic obstructive pulmonary disease, unspecified: Secondary | ICD-10-CM

## 2021-08-16 DIAGNOSIS — I25110 Atherosclerotic heart disease of native coronary artery with unstable angina pectoris: Secondary | ICD-10-CM

## 2021-08-16 DIAGNOSIS — I2510 Atherosclerotic heart disease of native coronary artery without angina pectoris: Secondary | ICD-10-CM

## 2021-08-26 ENCOUNTER — Telehealth: Payer: Self-pay | Admitting: Family Medicine

## 2021-08-26 NOTE — Telephone Encounter (Signed)
Also requesting amoxicillin for tooth abscess.  Pharmacy confirmed as   [facility] - [place], [place] - [address]  [address], [place] [address]  Phone:  [phone]  Fax:  [phone]   Please advise at [phone].

## 2021-08-26 NOTE — Telephone Encounter (Signed)
Spoke with pt and advised. He will contact dentist. Nothing further needed at this time.

## 2021-08-26 NOTE — Telephone Encounter (Signed)
ATC pt, no answer and vm is full

## 2021-08-26 NOTE — Telephone Encounter (Signed)
Spoke with pt's spouse and she states pt saw dentist on 07/25/21 for tooth abscess. Pt was found to have 2 abscesses. Pt was given amoxicillin 500mg , 1 cap 4xd, #28 and Tylenol 3, 1 tab 3xd, #12. She states pt has 1 capsule of amoxicillin left because she had not ben giving it to him every day, she gave varying reasons for this such as they went out of town and he had not been feeling well. She states the pain medication does help but pt is still having dental pain. She is asking for refills on both meds.   She also states the dentist is asking if pt can be taken off of blood thinners and heart rx's for 5 days so he can have multiple teeth extracted. She states if he cannot come off of these meds they will have to see an oral Psychologist, sport and exercise.  Please advise, thanks!

## 2021-08-26 NOTE — Telephone Encounter (Signed)
Received call from patient's wife. Patient having issue with teeth; dentist wants to take patient off of blood thinners and heart meds for five days to pull a tooth. Please advise at [phone].

## 2021-09-01 ENCOUNTER — Telehealth: Payer: Self-pay

## 2021-09-01 NOTE — Telephone Encounter (Signed)
° °  Telephone encounter was:  Successful.  09/01/2021 Name: KENIEL RALSTON MRN: [id number] DOB: 24-May-1951  Cowen T Stemmer is a 71 y.o. year old male who is a primary care patient of Pickard, Cammie Mcgee, MD . The community resource team was consulted for assistance with Food Insecurity and Financial and caregiver support information.  Care guide performed the following interventions:  Pt interested in food resources. Meals on Wheels application sent to [employer], food pantries sent out in mail, as well as [employer] information, and Acupuncturist. To help with financial needs and caregiver support information .  Follow Up Plan:  Care guide will follow up with patient by phone over the next few days to ensure mail was received.  [place] management  [place], [place]  Main Phone: [phone]   E-mail: [email or web address]  Website: [email or web address]

## 2021-09-03 ENCOUNTER — Other Ambulatory Visit: Payer: Self-pay

## 2021-09-03 DIAGNOSIS — J449 Chronic obstructive pulmonary disease, unspecified: Secondary | ICD-10-CM

## 2021-09-03 DIAGNOSIS — J441 Chronic obstructive pulmonary disease with (acute) exacerbation: Secondary | ICD-10-CM

## 2021-09-03 DIAGNOSIS — I4891 Unspecified atrial fibrillation: Secondary | ICD-10-CM

## 2021-09-03 MED ORDER — ALBUTEROL SULFATE (2.5 MG/3ML) 0.083% IN NEBU: 2.5000 mg | INHALATION_SOLUTION | RESPIRATORY_TRACT | 4 refills | Status: DC | PRN

## 2021-09-03 MED ORDER — ALBUTEROL SULFATE HFA 108 (90 BASE) MCG/ACT IN AERS: 2.0000 | INHALATION_SPRAY | Freq: Four times a day (QID) | RESPIRATORY_TRACT | 5 refills | Status: DC | PRN

## 2021-09-03 MED ORDER — APIXABAN 5 MG PO TABS: 5.0000 mg | ORAL_TABLET | Freq: Two times a day (BID) | ORAL | 3 refills | Status: DC

## 2021-09-12 ENCOUNTER — Encounter: Payer: Self-pay | Admitting: Family Medicine

## 2021-09-23 ENCOUNTER — Telehealth: Payer: Self-pay

## 2021-09-23 NOTE — Telephone Encounter (Signed)
° °  Telephone encounter was:  Successful.  09/23/2021 Name: [name] MRN: [id number] DOB: [birth year]  Zachary Chambers is a 71 y.o. year old male who is a primary care patient of Pickard, Cammie Mcgee, MD . The community resource team was consulted for assistance with  Food Insecurity and Financial and caregiver support information  Care guide performed the following interventions:  Pt advised mail was received and at this time he does not have any further questions. He advised if his does, he will have his friend Lelon Frohlich call me back on his behalf .  Follow Up Plan:  No further follow up planned at this time. The patient has been provided with needed resources.  [place] management  [place], [place]  Main Phone: [phone]   E-mail: [email or web address]  Website: [email or web address]

## 2021-10-03 ENCOUNTER — Telehealth: Payer: Self-pay | Admitting: Pharmacist

## 2021-10-03 NOTE — Progress Notes (Signed)
Chronic Care Management Pharmacy Assistant   Name: Zachary Chambers  MRN: [id number] DOB: 10/14/1950   Reason for Encounter: Disease State- General Adherence Call     Recent office visits:  06/16/21 Zachary Luo, MD - Family Medicine - COPD - No notes available.   05/26/21 Zachary Luo, MD - Family Medicine - COPD - Labs were ordered. cephALEXin (KEFLEX) 500 MG capsule, predniSONE (DELTASONE) 20 MG tablet, silver sulfADIAZINE (SILVADENE) 1 % cream prescribed. Follow up in 10-14 days.  Recent consult visits:  06/12/21 Zachary Montana, NP - Cardiology - benzonatate (TESSALON PERLES) 100 MG capsule prescribed. Follow up in 3 months.    Hospital visits:   Zachary Chambers in previous 6 months  Medications: Outpatient Encounter Medications as of 10/03/2021  Medication Sig Note   albuterol (PROVENTIL) (2.5 MG/3ML) 0.083% nebulizer solution Take 3 mLs (2.5 mg total) by nebulization every 4 (four) hours as needed for wheezing or shortness of breath.    albuterol (VENTOLIN HFA) 108 (90 Base) MCG/ACT inhaler Inhale 2 puffs into the lungs every 6 (six) hours as needed for wheezing or shortness of breath.    apixaban (ELIQUIS) 5 MG TABS tablet Take 1 tablet (5 mg total) by mouth 2 (two) times daily.    aspirin 81 MG tablet Take 81 mg by mouth at bedtime.    atorvastatin (LIPITOR) 40 MG tablet Take 1 tablet (40 mg total) by mouth at bedtime.    benzonatate (TESSALON PERLES) 100 MG capsule Take 1 capsule (100 mg total) by mouth 3 (three) times daily as needed for cough.    Blood Glucose Calibration (TRUE METRIX LEVEL 1) Low SOLN USE AS DIRECTED. To monitor fsbs 2 times per day dx e11.9 (Patient not taking: Reported on 08/01/2021)    Blood Glucose Monitoring Suppl (TRUE METRIX AIR GLUCOSE METER) DEVI USE AS DIRECTED. To monitor fsbs 2 times per day dx e11.9 (Patient not taking: Reported on 08/01/2021)    Budeson-Glycopyrrol-Formoterol (BREZTRI AEROSPHERE) 160-9-4.8 MCG/ACT AERO Inhale 2 puffs  into the lungs 2 (two) times daily.    cephALEXin (KEFLEX) 500 MG capsule Take 1 capsule (500 mg total) by mouth 3 (three) times daily.    Cholecalciferol (D3 PO) Take 1 tablet by mouth daily.    Cyanocobalamin (VITAMIN B-12 PO) Take 1 tablet by mouth daily. 04/19/2021: Pt is not sure about this medication   fluticasone (FLONASE) 50 MCG/ACT nasal spray Place 2 sprays into both nostrils daily.    folic acid (FOLVITE) 1 MG tablet TAKE 1 TABLET BY MOUTH EVERY DAY    glucose blood (RELION TRUE METRIX TEST STRIPS) test strip USE AS DIRECTED. To monitor fsbs 2 times per day dx e11.9 (Patient not taking: Reported on 08/01/2021)    isosorbide mononitrate (IMDUR) 30 MG 24 hr tablet Take 0.5 tablets (15 mg total) by mouth daily.    LORazepam (ATIVAN) 0.5 MG tablet Take 1 tablet (0.5 mg total) by mouth 2 (two) times daily as needed for anxiety.    magnesium oxide (MAG-OX) 400 (240 Mg) MG tablet Take 1 tablet (400 mg total) by mouth daily.    metoprolol tartrate (LOPRESSOR) 25 MG tablet Take 1 tablet (25 mg total) by mouth 2 (two) times daily.    Multiple Vitamin (MULTIVITAMIN WITH MINERALS) TABS tablet Take 1 tablet by mouth daily. 03/12/2021: Centrum for men   nitroGLYCERIN (NITROSTAT) 0.4 MG SL tablet DISSOLVE 1 TABLET UNDER THE TONGUE EVERY 5 MINUTES AS NEEDED FOR CHEST PAIN    omeprazole (PRILOSEC) 40 MG  capsule Take 1 capsule (40 mg total) by mouth daily.    potassium chloride SA (KLOR-CON) 20 MEQ tablet Take 1 tablet (20 mEq total) by mouth daily for 4 days.    predniSONE (DELTASONE) 20 MG tablet 3 tabs poqday 1-2, 2 tabs poqday 3-4, 1 tab poqday 5-6    QUEtiapine (SEROQUEL) 25 MG tablet Take 1 tablet (25 mg total) by mouth at bedtime.    SSD 1 % cream APPLY 1 APPLICATION EVERY DAY    thiamine 100 MG tablet Take 5 tablets (500 mg total) by mouth daily.    traMADol (ULTRAM) 50 MG tablet TAKE 1 TABLET BY MOUTH EVERY 6 HOURS AS NEEDED FOR moderate PAIN    zinc gluconate 50 MG tablet Take 50 mg by mouth  daily.    No facility-administered encounter medications on file as of 10/03/2021.    Have you had any problems recently with your health? Patient denied any recent problems or issues with his health. He stated he is doing well currently.   Have you had any problems with your pharmacy? Patient denied any problems with his current pharmacy.  What issues or side effects are you having with your medications? Patient denied any issues or side effects with his current medications.   What would you like me to pass along to Leata Mouse, CPP for them to help you with?  Patient did not have anything to pass along to CPP at this time. He stated he was currently doing well and did not need anything.   What can we do to take care of you better? Patient did not have any recommendations at this time.   Care Gaps  AWV: done 08/01/21 Colonoscopy: done 03/09/16 DM Eye Exam: N/A  DM Foot Exam: N/A Microalbumin: N/A HbgAIC: N/A DEXA:  N/A Mammogram: N/A  Star Rating Drugs: atorvastatin (LIPITOR) 40 MG tablet - last filled 07/29/21 90 days     Future Appointments  Date Time Provider [place]  11/07/2021  3:30 PM Josue Hector, MD [facility]  08/07/2022  2:00 PM [facility]-NURSE HEALTH ADVISOR [facility] None   Jobe Gibbon, [facility] Clinical Pharmacist Assistant  [phone]

## 2021-10-28 ENCOUNTER — Telehealth: Payer: Self-pay | Admitting: Pharmacist

## 2021-10-28 NOTE — Progress Notes (Signed)
? ? ?Chronic Care Management ?Pharmacy Assistant  ? ?Name: Zachary Chambers  MRN: [id number] DOB: 1951-08-14 ? ? ?Reason for Encounter: Disease State - General Adherence Call  ?  ? ?Recent office visits:  ?None noted. ? ?Recent consult visits:  ?None noted.  ? ?Hospital visits:  ?None in previous 6 months ? ?Medications: ?Outpatient Encounter Medications as of 10/28/2021  ?Medication Sig Note  ? albuterol (PROVENTIL) (2.5 MG/3ML) 0.083% nebulizer solution Take 3 mLs (2.5 mg total) by nebulization every 4 (four) hours as needed for wheezing or shortness of breath.   ? albuterol (VENTOLIN HFA) 108 (90 Base) MCG/ACT inhaler Inhale 2 puffs into the lungs every 6 (six) hours as needed for wheezing or shortness of breath.   ? apixaban (ELIQUIS) 5 MG TABS tablet Take 1 tablet (5 mg total) by mouth 2 (two) times daily.   ? aspirin 81 MG tablet Take 81 mg by mouth at bedtime.   ? atorvastatin (LIPITOR) 40 MG tablet Take 1 tablet (40 mg total) by mouth at bedtime.   ? benzonatate (TESSALON PERLES) 100 MG capsule Take 1 capsule (100 mg total) by mouth 3 (three) times daily as needed for cough.   ? Blood Glucose Calibration (TRUE METRIX LEVEL 1) Low SOLN USE AS DIRECTED. To monitor fsbs 2 times per day dx e11.9 (Patient not taking: Reported on 08/01/2021)   ? Blood Glucose Monitoring Suppl (TRUE METRIX AIR GLUCOSE METER) DEVI USE AS DIRECTED. To monitor fsbs 2 times per day dx e11.9 (Patient not taking: Reported on 08/01/2021)   ? Budeson-Glycopyrrol-Formoterol (BREZTRI AEROSPHERE) 160-9-4.8 MCG/ACT AERO Inhale 2 puffs into the lungs 2 (two) times daily.   ? cephALEXin (KEFLEX) 500 MG capsule Take 1 capsule (500 mg total) by mouth 3 (three) times daily.   ? Cholecalciferol (D3 PO) Take 1 tablet by mouth daily.   ? Cyanocobalamin (VITAMIN B-12 PO) Take 1 tablet by mouth daily. 04/19/2021: Pt is not sure about this medication  ? fluticasone (FLONASE) 50 MCG/ACT nasal spray Place 2 sprays into both nostrils daily.   ? folic acid (FOLVITE)  1 MG tablet TAKE 1 TABLET BY MOUTH EVERY DAY   ? glucose blood (RELION TRUE METRIX TEST STRIPS) test strip USE AS DIRECTED. To monitor fsbs 2 times per day dx e11.9 (Patient not taking: Reported on 08/01/2021)   ? isosorbide mononitrate (IMDUR) 30 MG 24 hr tablet Take 0.5 tablets (15 mg total) by mouth daily.   ? LORazepam (ATIVAN) 0.5 MG tablet Take 1 tablet (0.5 mg total) by mouth 2 (two) times daily as needed for anxiety.   ? magnesium oxide (MAG-OX) 400 (240 Mg) MG tablet Take 1 tablet (400 mg total) by mouth daily.   ? metoprolol tartrate (LOPRESSOR) 25 MG tablet Take 1 tablet (25 mg total) by mouth 2 (two) times daily.   ? Multiple Vitamin (MULTIVITAMIN WITH MINERALS) TABS tablet Take 1 tablet by mouth daily. 03/12/2021: Centrum for men  ? nitroGLYCERIN (NITROSTAT) 0.4 MG SL tablet DISSOLVE 1 TABLET UNDER THE TONGUE EVERY 5 MINUTES AS NEEDED FOR CHEST PAIN   ? omeprazole (PRILOSEC) 40 MG capsule Take 1 capsule (40 mg total) by mouth daily.   ? potassium chloride SA (KLOR-CON) 20 MEQ tablet Take 1 tablet (20 mEq total) by mouth daily for 4 days.   ? predniSONE (DELTASONE) 20 MG tablet 3 tabs poqday 1-2, 2 tabs poqday 3-4, 1 tab poqday 5-6   ? QUEtiapine (SEROQUEL) 25 MG tablet Take 1 tablet (25 mg total) by  mouth at bedtime.   ? SSD 1 % cream APPLY 1 APPLICATION EVERY DAY   ? thiamine 100 MG tablet Take 5 tablets (500 mg total) by mouth daily.   ? traMADol (ULTRAM) 50 MG tablet TAKE 1 TABLET BY MOUTH EVERY 6 HOURS AS NEEDED FOR moderate PAIN   ? zinc gluconate 50 MG tablet Take 50 mg by mouth daily.   ? ?No facility-administered encounter medications on file as of 10/28/2021.  ? ? ?Have you had any problems recently with your health? ?Patient denied any recent problems or issues with his health. He stated he is doing well currently.  ?  ?Have you had any problems with your pharmacy? ?Patient denied any problems with his current pharmacy. ?  ?What issues or side effects are you having with your  medications? ?Patient denied any issues or side effects with his current medications.  ?  ?What would you like me to pass along to Leata Mouse, CPP for them to help you with?  ?Patient did not have anything to pass along to CPP at this time. He stated he was currently doing well and did not need anything.  ?  ?What can we do to take care of you better? ?Patient did not have any recommendations at this time.  ? ? ?Care Gaps ?  ?AWV: done 08/01/21 ?Colonoscopy: done 03/09/16 ?DM Eye Exam: N/A  ?DM Foot Exam: N/A ?Microalbumin: N/A ?HbgAIC: N/A ?DEXA:  N/A ?Mammogram: N/A ?  ?Star Rating Drugs: ?Atorvastatin (LIPITOR) 40 MG tablet - last filled 07/29/21 90 days  ? ? ? ?Future Appointments  ?Date Time Provider [place]  ?11/07/2021  3:30 PM Josue Hector, MD [facility]  ?08/07/2022  2:00 PM [facility]-NURSE HEALTH ADVISOR [facility]  ? ? ? ?Liza Showfety, CCMA ?Clinical Pharmacist Assistant  ?[phone] ? ?

## 2021-10-31 NOTE — Progress Notes (Incomplete)
CARDIOLOGY CONSULT NOTE  ? ? ? ? ?Primary Physician: [name], MD ?Primary Cardiologist: [name] ? ?F/U CAD ? ?HPI:  71 y.o. I have note seen in a while. Last seen in consult by Dr [name] in hospital 03/06/20 History of CAD with PCI circumflex and then LAD last 2003 Has COPD  Has vascular disease with diagnosis of AAA but diameter < 3.0 on duplex 2018 Presented with [facility] He r/o with no acute ECG changes Myovue 03/08/20 no ischemia low risk study EF 63% d/c home  ? ?He follows with [facility] Quit smoking 06/2020 after COVID pneumonia d/c on 2L oxygen He is very inactive and in wheel chair mostly Severe COPD F/F ratio 45% DLCO 47% Needs up to 5L with activity  ? ?History of CVA 08/29/20 seen by neurology on low dose eliquis  Carotids plaque no stenosis 08/19/18  CTA head 10/04/20 no vascular stenosis old left frontoparietal infarct He does have a history of PAF  ? ?TTE 02/2021 EF 65-70% small pericardial effusion  ? ?*** ? ? ?ROS ?All other systems reviewed and negative except as noted above ? ?Past Medical History:  ?Diagnosis Date  ?? Alcohol abuse   ?? Aortic atherosclerosis ([place])   ?? CAD in native artery   ?? Chronic respiratory failure ([place])   ?? COPD (chronic obstructive pulmonary disease) ([place])   ?? DJD (degenerative joint disease)   ? diffusely  ?? GERD (gastroesophageal reflux disease)   ?? Hyperlipidemia   ?? On home O2   ?? Paroxysmal atrial fibrillation ([facility])   ?? Pericardial effusion   ?? Pneumonia due to COVID-19 virus   ?? PUD (peptic ulcer disease)   ? with bleeding  ?? Rotator cuff disorder   ? has been evaluated by Dr Clifton James and [facility]  ?? Stroke (cerebrum) ([place])   ?  ?Family History  ?Problem Relation Age of Onset  ?? Ovarian cancer Mother   ?? Diabetes Maternal Grandfather   ?? COPD Father   ?? Diabetes Paternal Grandfather   ?? Colon cancer Paternal Grandfather   ?? Esophageal cancer Neg Hx   ?? Rectal cancer Neg Hx   ?? Stomach cancer Neg Hx   ?  ?Social History  ? ?Socioeconomic  History  ?? Marital status: Single  ?  Spouse name: Not on file  ?? Number of children: Not on file  ?? Years of education: Not on file  ?? Highest education level: Not on file  ?Occupational History  ?? Occupation: retired  ?Tobacco Use  ?? Smoking status: Former  ?  Packs/day: 1.00  ?  Years: 40.00  ?  Pack years: 40.00  ?  Types: Cigarettes  ?? Smokeless tobacco: Never  ?? Tobacco comments:  ?  declines patch  ?Vaping Use  ?? Vaping Use: Never used  ?Substance and Sexual Activity  ?? Alcohol use: Yes  ?  Alcohol/week: 30.0 standard drinks  ?  Types: 30 Cans of beer per week  ?  Comment: h/o heavy use, now 4 beers per day  ?? Drug use: Not Currently  ?? Sexual activity: Yes  ?Other Topics Concern  ?? Not on file  ?Social History Narrative  ? Right Handed  ? Lives in one story home   ?   ? Patient has not smoked for 3 months  ? Patient on 2L of Oxygen   ? ?Social Determinants of Health  ? ?Financial Resource Strain: Medium Risk  ?? Difficulty of Paying Living Expenses: Somewhat hard  ?  Food Insecurity: Food Insecurity Present  ?? Worried About Charity fundraiser in the Last Year: Sometimes true  ?? Ran Out of Food in the Last Year: Sometimes true  ?Transportation Needs: No Transportation Needs  ?? Lack of Transportation (Medical): No  ?? Lack of Transportation (Non-Medical): No  ?Physical Activity: Insufficiently Active  ?? Days of Exercise per Week: 3 days  ?? Minutes of Exercise per Session: 20 min  ?Stress: Stress Concern Present  ?? Feeling of Stress : To some extent  ?Social Connections: Socially Integrated  ?? Frequency of Communication with Friends and Family: More than three times a week  ?? Frequency of Social Gatherings with Friends and Family: Once a week  ?? Attends Religious Services: 1 to 4 times per year  ?? Active Member of Clubs or Organizations: No  ?? Attends Archivist Meetings: 1 to 4 times per year  ?? Marital Status: Living with partner  ?Intimate Partner Violence: Not At Risk  ??  Fear of Current or Ex-Partner: No  ?? Emotionally Abused: No  ?? Physically Abused: No  ?? Sexually Abused: No  ?  ?Past Surgical History:  ?Procedure Laterality Date  ?? COLONOSCOPY    ?? EYE SURGERY    ? right eye removed at age 25  ?? heart stent    ? x3- 2006  ?  ? ? ?Current Outpatient Medications:  ??  albuterol (PROVENTIL) (2.5 MG/3ML) 0.083% nebulizer solution, Take 3 mLs (2.5 mg total) by nebulization every 4 (four) hours as needed for wheezing or shortness of breath., Disp: 180 mL, Rfl: 4 ??  albuterol (VENTOLIN HFA) 108 (90 Base) MCG/ACT inhaler, Inhale 2 puffs into the lungs every 6 (six) hours as needed for wheezing or shortness of breath., Disp: 18 g, Rfl: 5 ??  apixaban (ELIQUIS) 5 MG TABS tablet, Take 1 tablet (5 mg total) by mouth 2 (two) times daily., Disp: 180 tablet, Rfl: 3 ??  aspirin 81 MG tablet, Take 81 mg by mouth at bedtime., Disp: , Rfl:  ??  atorvastatin (LIPITOR) 40 MG tablet, Take 1 tablet (40 mg total) by mouth at bedtime., Disp: 90 tablet, Rfl: 3 ??  benzonatate (TESSALON PERLES) 100 MG capsule, Take 1 capsule (100 mg total) by mouth 3 (three) times daily as needed for cough., Disp: 20 capsule, Rfl: 0 ??  Blood Glucose Calibration (TRUE METRIX LEVEL 1) Low SOLN, USE AS DIRECTED. To monitor fsbs 2 times per day dx e11.9 (Patient not taking: Reported on 08/01/2021), Disp: 1 each, Rfl: 3 ??  Blood Glucose Monitoring Suppl (TRUE METRIX AIR GLUCOSE METER) DEVI, USE AS DIRECTED. To monitor fsbs 2 times per day dx e11.9 (Patient not taking: Reported on 08/01/2021), Disp: 1 each, Rfl: 3 ??  Budeson-Glycopyrrol-Formoterol (BREZTRI AEROSPHERE) 160-9-4.8 MCG/ACT AERO, Inhale 2 puffs into the lungs 2 (two) times daily., Disp: 17.7 g, Rfl: 3 ??  cephALEXin (KEFLEX) 500 MG capsule, Take 1 capsule (500 mg total) by mouth 3 (three) times daily., Disp: 21 capsule, Rfl: 0 ??  Cholecalciferol (D3 PO), Take 1 tablet by mouth daily., Disp: , Rfl:  ??  Cyanocobalamin (VITAMIN B-12 PO), Take 1 tablet by  mouth daily., Disp: , Rfl:  ??  fluticasone (FLONASE) 50 MCG/ACT nasal spray, Place 2 sprays into both nostrils daily., Disp: 48 g, Rfl: 3 ??  folic acid (FOLVITE) 1 MG tablet, TAKE 1 TABLET BY MOUTH EVERY DAY, Disp: 90 tablet, Rfl: 3 ??  glucose blood (RELION TRUE METRIX TEST STRIPS) test strip, USE  AS DIRECTED. To monitor fsbs 2 times per day dx e11.9 (Patient not taking: Reported on 08/01/2021), Disp: 100 each, Rfl: 3 ??  isosorbide mononitrate (IMDUR) 30 MG 24 hr tablet, Take 0.5 tablets (15 mg total) by mouth daily., Disp: 45 tablet, Rfl: 3 ??  LORazepam (ATIVAN) 0.5 MG tablet, Take 1 tablet (0.5 mg total) by mouth 2 (two) times daily as needed for anxiety., Disp: 180 tablet, Rfl: 0 ??  magnesium oxide (MAG-OX) 400 (240 Mg) MG tablet, Take 1 tablet (400 mg total) by mouth daily., Disp: 100 tablet, Rfl: 0 ??  metoprolol tartrate (LOPRESSOR) 25 MG tablet, Take 1 tablet (25 mg total) by mouth 2 (two) times daily., Disp: 180 tablet, Rfl: 3 ??  Multiple Vitamin (MULTIVITAMIN WITH MINERALS) TABS tablet, Take 1 tablet by mouth daily., Disp: 90 tablet, Rfl: 0 ??  nitroGLYCERIN (NITROSTAT) 0.4 MG SL tablet, DISSOLVE 1 TABLET UNDER THE TONGUE EVERY 5 MINUTES AS NEEDED FOR CHEST PAIN, Disp: 25 tablet, Rfl: 0 ??  omeprazole (PRILOSEC) 40 MG capsule, Take 1 capsule (40 mg total) by mouth daily., Disp: 90 capsule, Rfl: 3 ??  potassium chloride SA (KLOR-CON) 20 MEQ tablet, Take 1 tablet (20 mEq total) by mouth daily for 4 days., Disp: 4 tablet, Rfl: 0 ??  predniSONE (DELTASONE) 20 MG tablet, 3 tabs poqday 1-2, 2 tabs poqday 3-4, 1 tab poqday 5-6, Disp: 12 tablet, Rfl: 0 ??  QUEtiapine (SEROQUEL) 25 MG tablet, Take 1 tablet (25 mg total) by mouth at bedtime., Disp: 90 tablet, Rfl: 3 ??  SSD 1 % cream, APPLY 1 APPLICATION EVERY DAY, Disp: 50 g, Rfl: 0 ??  thiamine 100 MG tablet, Take 5 tablets (500 mg total) by mouth daily., Disp: 100 tablet, Rfl: 0 ??  traMADol (ULTRAM) 50 MG tablet, TAKE 1 TABLET BY MOUTH EVERY 6 HOURS AS  NEEDED FOR moderate PAIN, Disp: 30 tablet, Rfl: 0 ??  zinc gluconate 50 MG tablet, Take 50 mg by mouth daily., Disp: , Rfl:  ? ? ? ?Physical Exam: ?There were no vitals taken for this visit.   ? ?Chronically

## 2021-11-04 ENCOUNTER — Ambulatory Visit (INDEPENDENT_AMBULATORY_CARE_PROVIDER_SITE_OTHER): Payer: Medicare HMO | Admitting: Family Medicine

## 2021-11-04 ENCOUNTER — Emergency Department (HOSPITAL_COMMUNITY): Payer: Medicare HMO

## 2021-11-04 ENCOUNTER — Other Ambulatory Visit: Payer: Self-pay

## 2021-11-04 ENCOUNTER — Encounter (HOSPITAL_COMMUNITY): Payer: Self-pay | Admitting: Internal Medicine

## 2021-11-04 ENCOUNTER — Inpatient Hospital Stay (HOSPITAL_COMMUNITY)
Admission: EM | Admit: 2021-11-04 | Discharge: 2021-11-08 | DRG: 190 | Disposition: A | Discharge status: Home Health | Payer: Medicare HMO | Source: Ambulatory Visit | Attending: Family Medicine | Admitting: Family Medicine

## 2021-11-04 VITALS — BP 146/82 | HR 113 | Temp 97.9°F | Ht 68.0 in

## 2021-11-04 DIAGNOSIS — Z9114 Patient's other noncompliance with medication regimen: Secondary | ICD-10-CM

## 2021-11-04 DIAGNOSIS — I480 Paroxysmal atrial fibrillation: Secondary | ICD-10-CM | POA: Diagnosis present

## 2021-11-04 DIAGNOSIS — Z8616 Personal history of COVID-19: Secondary | ICD-10-CM | POA: Diagnosis not present

## 2021-11-04 DIAGNOSIS — L0100 Impetigo, unspecified: Secondary | ICD-10-CM | POA: Insufficient documentation

## 2021-11-04 DIAGNOSIS — R051 Acute cough: Secondary | ICD-10-CM

## 2021-11-04 DIAGNOSIS — I10 Essential (primary) hypertension: Secondary | ICD-10-CM | POA: Diagnosis not present

## 2021-11-04 DIAGNOSIS — J449 Chronic obstructive pulmonary disease, unspecified: Secondary | ICD-10-CM

## 2021-11-04 DIAGNOSIS — Z8041 Family history of malignant neoplasm of ovary: Secondary | ICD-10-CM

## 2021-11-04 DIAGNOSIS — J9611 Chronic respiratory failure with hypoxia: Secondary | ICD-10-CM | POA: Diagnosis present

## 2021-11-04 DIAGNOSIS — Z681 Body mass index (BMI) 19 or less, adult: Secondary | ICD-10-CM

## 2021-11-04 DIAGNOSIS — Z7901 Long term (current) use of anticoagulants: Secondary | ICD-10-CM | POA: Diagnosis not present

## 2021-11-04 DIAGNOSIS — Z8711 Personal history of peptic ulcer disease: Secondary | ICD-10-CM | POA: Diagnosis not present

## 2021-11-04 DIAGNOSIS — E43 Unspecified severe protein-calorie malnutrition: Secondary | ICD-10-CM | POA: Diagnosis present

## 2021-11-04 DIAGNOSIS — Z7982 Long term (current) use of aspirin: Secondary | ICD-10-CM

## 2021-11-04 DIAGNOSIS — I252 Old myocardial infarction: Secondary | ICD-10-CM | POA: Diagnosis not present

## 2021-11-04 DIAGNOSIS — Z800 Family history of malignant neoplasm of digestive organs: Secondary | ICD-10-CM

## 2021-11-04 DIAGNOSIS — Z7951 Long term (current) use of inhaled steroids: Secondary | ICD-10-CM

## 2021-11-04 DIAGNOSIS — R4189 Other symptoms and signs involving cognitive functions and awareness: Secondary | ICD-10-CM

## 2021-11-04 DIAGNOSIS — Z833 Family history of diabetes mellitus: Secondary | ICD-10-CM

## 2021-11-04 DIAGNOSIS — I471 Supraventricular tachycardia: Secondary | ICD-10-CM | POA: Diagnosis not present

## 2021-11-04 DIAGNOSIS — F32A Depression, unspecified: Secondary | ICD-10-CM

## 2021-11-04 DIAGNOSIS — K219 Gastro-esophageal reflux disease without esophagitis: Secondary | ICD-10-CM | POA: Diagnosis not present

## 2021-11-04 DIAGNOSIS — J441 Chronic obstructive pulmonary disease with (acute) exacerbation: Secondary | ICD-10-CM

## 2021-11-04 DIAGNOSIS — E876 Hypokalemia: Secondary | ICD-10-CM | POA: Diagnosis present

## 2021-11-04 DIAGNOSIS — J9612 Chronic respiratory failure with hypercapnia: Secondary | ICD-10-CM | POA: Diagnosis present

## 2021-11-04 DIAGNOSIS — Z8673 Personal history of transient ischemic attack (TIA), and cerebral infarction without residual deficits: Secondary | ICD-10-CM | POA: Diagnosis not present

## 2021-11-04 DIAGNOSIS — F0390 Unspecified dementia without behavioral disturbance: Secondary | ICD-10-CM | POA: Diagnosis present

## 2021-11-04 DIAGNOSIS — R0600 Dyspnea, unspecified: Secondary | ICD-10-CM | POA: Diagnosis not present

## 2021-11-04 DIAGNOSIS — I491 Atrial premature depolarization: Secondary | ICD-10-CM | POA: Diagnosis not present

## 2021-11-04 DIAGNOSIS — Z9981 Dependence on supplemental oxygen: Secondary | ICD-10-CM

## 2021-11-04 DIAGNOSIS — I2510 Atherosclerotic heart disease of native coronary artery without angina pectoris: Secondary | ICD-10-CM | POA: Diagnosis not present

## 2021-11-04 DIAGNOSIS — Z20822 Contact with and (suspected) exposure to covid-19: Secondary | ICD-10-CM | POA: Diagnosis present

## 2021-11-04 DIAGNOSIS — E785 Hyperlipidemia, unspecified: Secondary | ICD-10-CM | POA: Diagnosis present

## 2021-11-04 DIAGNOSIS — I25110 Atherosclerotic heart disease of native coronary artery with unstable angina pectoris: Secondary | ICD-10-CM

## 2021-11-04 DIAGNOSIS — R000 Tachycardia, unspecified: Secondary | ICD-10-CM | POA: Diagnosis not present

## 2021-11-04 DIAGNOSIS — Z825 Family history of asthma and other chronic lower respiratory diseases: Secondary | ICD-10-CM

## 2021-11-04 DIAGNOSIS — Z87891 Personal history of nicotine dependence: Secondary | ICD-10-CM

## 2021-11-04 DIAGNOSIS — Z955 Presence of coronary angioplasty implant and graft: Secondary | ICD-10-CM

## 2021-11-04 DIAGNOSIS — I700 Atherosclerosis of aorta: Secondary | ICD-10-CM | POA: Diagnosis present

## 2021-11-04 DIAGNOSIS — R0602 Shortness of breath: Secondary | ICD-10-CM | POA: Diagnosis not present

## 2021-11-04 DIAGNOSIS — Z79899 Other long term (current) drug therapy: Secondary | ICD-10-CM

## 2021-11-04 LAB — BASIC METABOLIC PANEL
Anion gap: 12 (ref 5–15)
BUN: 16 mg/dL (ref 8–23)
CO2: 33 mmol/L — ABNORMAL HIGH (ref 22–32)
Calcium: 8.8 mg/dL — ABNORMAL LOW (ref 8.9–10.3)
Chloride: 93 mmol/L — ABNORMAL LOW (ref 98–111)
Creatinine, Ser: 1.27 mg/dL — ABNORMAL HIGH (ref 0.61–1.24)
GFR, Estimated: >60 mL/min (ref >60)
Glucose, Bld: 115 mg/dL — ABNORMAL HIGH (ref 70–99)
Potassium: 2.9 mmol/L — ABNORMAL LOW (ref 3.5–5.1)
Sodium: 138 mmol/L (ref 135–145)

## 2021-11-04 LAB — CBC
HCT: 43 % (ref 39.0–52.0)
Hemoglobin: 13.4 g/dL (ref 13.0–17.0)
MCH: 23.6 pg — ABNORMAL LOW (ref 26.0–34.0)
MCHC: 31.2 g/dL (ref 30.0–36.0)
MCV: 75.7 fL — ABNORMAL LOW (ref 80.0–100.0)
Platelets: 354 10*3/uL (ref 150–400)
RBC: 5.68 MIL/uL (ref 4.22–5.81)
RDW: 18.9 % — ABNORMAL HIGH (ref 11.5–15.5)
WBC: 8.8 10*3/uL (ref 4.0–10.5)
nRBC: 0 % (ref 0.0–0.2)

## 2021-11-04 LAB — RESP PANEL BY RT-PCR (FLU A&B, COVID) ARPGX2
Influenza A by PCR: NEGATIVE
Influenza B by PCR: NEGATIVE
SARS Coronavirus 2 by RT PCR: NEGATIVE

## 2021-11-04 LAB — I-STAT ARTERIAL BLOOD GAS, ED
Acid-Base Excess: 7 mmol/L — ABNORMAL HIGH (ref 0.0–2.0)
Bicarbonate: 33.1 mmol/L — ABNORMAL HIGH (ref 20.0–28.0)
Calcium, Ion: 1.16 mmol/L (ref 1.15–1.40)
HCT: 40 % (ref 39.0–52.0)
Hemoglobin: 13.6 g/dL (ref 13.0–17.0)
O2 Saturation: 95 %
Patient temperature: 100.4
Potassium: 2.9 mmol/L — ABNORMAL LOW (ref 3.5–5.1)
Sodium: 135 mmol/L (ref 135–145)
TCO2: 35 mmol/L — ABNORMAL HIGH (ref 22–32)
pCO2 arterial: 51.8 mmHg — ABNORMAL HIGH (ref 32–48)
pH, Arterial: 7.418 (ref 7.35–7.45)
pO2, Arterial: 80 mmHg — ABNORMAL LOW (ref 83–108)

## 2021-11-04 LAB — BRAIN NATRIURETIC PEPTIDE: B Natriuretic Peptide: 186.1 pg/mL — ABNORMAL HIGH (ref 0.0–100.0)

## 2021-11-04 MED ORDER — IPRATROPIUM-ALBUTEROL 0.5-2.5 (3) MG/3ML IN SOLN
3.0000 mL | Freq: Once | RESPIRATORY_TRACT | Status: AC
  Administered 2021-11-04: 3 mL via RESPIRATORY_TRACT
  Filled 2021-11-04: qty 3

## 2021-11-04 MED ORDER — METOPROLOL TARTRATE 25 MG PO TABS
25.0000 mg | ORAL_TABLET | Freq: Two times a day (BID) | ORAL | Status: DC
  Administered 2021-11-04 – 2021-11-05 (×2): 25 mg via ORAL
  Filled 2021-11-04 (×2): qty 1

## 2021-11-04 MED ORDER — MELATONIN 5 MG PO TABS
10.0000 mg | ORAL_TABLET | Freq: Every evening | ORAL | Status: DC | PRN
  Filled 2021-11-04: qty 2

## 2021-11-04 MED ORDER — POTASSIUM CHLORIDE CRYS ER 20 MEQ PO TBCR
40.0000 meq | EXTENDED_RELEASE_TABLET | Freq: Once | ORAL | Status: AC
  Administered 2021-11-04: 40 meq via ORAL
  Filled 2021-11-04: qty 2

## 2021-11-04 MED ORDER — ONDANSETRON HCL 4 MG PO TABS: 4.0000 mg | ORAL_TABLET | Freq: Four times a day (QID) | ORAL | Status: DC | PRN

## 2021-11-04 MED ORDER — IPRATROPIUM-ALBUTEROL 0.5-2.5 (3) MG/3ML IN SOLN
3.0000 mL | Freq: Once | RESPIRATORY_TRACT | Status: AC
  Administered 2021-11-04: 3 mL via RESPIRATORY_TRACT

## 2021-11-04 MED ORDER — POTASSIUM CHLORIDE 10 MEQ/100ML IV SOLN
10.0000 meq | Freq: Once | INTRAVENOUS | Status: AC
  Administered 2021-11-04: 10 meq via INTRAVENOUS
  Filled 2021-11-04: qty 100

## 2021-11-04 MED ORDER — ATORVASTATIN CALCIUM 40 MG PO TABS
40.0000 mg | ORAL_TABLET | Freq: Every day | ORAL | Status: DC
  Administered 2021-11-04 – 2021-11-07 (×4): 40 mg via ORAL
  Filled 2021-11-04 (×4): qty 1

## 2021-11-04 MED ORDER — ASPIRIN 81 MG PO CHEW
81.0000 mg | CHEWABLE_TABLET | Freq: Every day | ORAL | Status: DC
  Administered 2021-11-04 – 2021-11-07 (×4): 81 mg via ORAL
  Filled 2021-11-04 (×4): qty 1

## 2021-11-04 MED ORDER — CEPHALEXIN 500 MG PO CAPS
500.0000 mg | ORAL_CAPSULE | Freq: Three times a day (TID) | ORAL | Status: DC
  Administered 2021-11-04 – 2021-11-05 (×2): 500 mg via ORAL
  Filled 2021-11-04 (×2): qty 1

## 2021-11-04 MED ORDER — ACETAMINOPHEN 325 MG PO TABS: 650.0000 mg | ORAL_TABLET | Freq: Four times a day (QID) | ORAL | Status: DC | PRN

## 2021-11-04 MED ORDER — POTASSIUM CHLORIDE 10 MEQ/100ML IV SOLN
10.0000 meq | INTRAVENOUS | Status: AC
  Administered 2021-11-04 – 2021-11-05 (×2): 10 meq via INTRAVENOUS
  Filled 2021-11-04 (×2): qty 100

## 2021-11-04 MED ORDER — PANTOPRAZOLE SODIUM 40 MG PO TBEC
40.0000 mg | DELAYED_RELEASE_TABLET | Freq: Every day | ORAL | Status: DC
  Administered 2021-11-05 – 2021-11-08 (×4): 40 mg via ORAL
  Filled 2021-11-04 (×4): qty 1

## 2021-11-04 MED ORDER — METHYLPREDNISOLONE ACETATE 80 MG/ML IJ SUSP
80.0000 mg | Freq: Once | INTRAMUSCULAR | Status: AC
  Administered 2021-11-04: 80 mg via INTRAMUSCULAR

## 2021-11-04 MED ORDER — MAGNESIUM SULFATE 2 GM/50ML IV SOLN
2.0000 g | Freq: Once | INTRAVENOUS | Status: AC
  Administered 2021-11-04: 2 g via INTRAVENOUS
  Filled 2021-11-04: qty 50

## 2021-11-04 MED ORDER — ONDANSETRON HCL 4 MG/2ML IJ SOLN: 4.0000 mg | Freq: Four times a day (QID) | INTRAMUSCULAR | Status: DC | PRN

## 2021-11-04 MED ORDER — APIXABAN 5 MG PO TABS
5.0000 mg | ORAL_TABLET | Freq: Two times a day (BID) | ORAL | Status: DC
  Administered 2021-11-04 – 2021-11-08 (×8): 5 mg via ORAL
  Filled 2021-11-04 (×8): qty 1

## 2021-11-04 MED ORDER — ISOSORBIDE MONONITRATE ER 30 MG PO TB24
15.0000 mg | ORAL_TABLET | Freq: Every day | ORAL | Status: DC
  Administered 2021-11-05 – 2021-11-08 (×4): 15 mg via ORAL
  Filled 2021-11-04 (×4): qty 1

## 2021-11-04 MED ORDER — ACETAMINOPHEN 650 MG RE SUPP: 650.0000 mg | Freq: Four times a day (QID) | RECTAL | Status: DC | PRN

## 2021-11-04 MED ORDER — IPRATROPIUM-ALBUTEROL 0.5-2.5 (3) MG/3ML IN SOLN
3.0000 mL | Freq: Four times a day (QID) | RESPIRATORY_TRACT | Status: DC
  Administered 2021-11-05 (×2): 3 mL via RESPIRATORY_TRACT
  Filled 2021-11-04 (×2): qty 3

## 2021-11-04 MED ORDER — PREDNISONE 20 MG PO TABS
40.0000 mg | ORAL_TABLET | Freq: Every day | ORAL | Status: DC
  Administered 2021-11-05: 40 mg via ORAL
  Filled 2021-11-04: qty 2

## 2021-11-04 NOTE — Assessment & Plan Note (Addendum)
Impetigo ruled out  ?

## 2021-11-04 NOTE — Assessment & Plan Note (Addendum)
-   Supplemented and resolved 

## 2021-11-04 NOTE — Assessment & Plan Note (Signed)
Right ear. PCP wanted to treat with po keflex. ?

## 2021-11-04 NOTE — ED Notes (Signed)
Pt HR in the 160s - Repeat EKG obtained - Pt reports no chest pain at this time  ?

## 2021-11-04 NOTE — Assessment & Plan Note (Addendum)
-  Continue metoprolol, Eliquis ?

## 2021-11-04 NOTE — Assessment & Plan Note (Addendum)
-  Continue aspirin, Eliquis, lipitor, Imdur, metoprolol ?

## 2021-11-04 NOTE — H&P (Signed)
?History and Physical  ? ? ?Zachary Chambers AOZ:[id number] DOB: 08-14-1951 DOA: 11/04/2021 ? ?DOS: the patient was seen and examined on 11/04/2021 ? ?PCP: [name], MD  ? ?Patient coming from: Clinic ? ?I have personally briefly reviewed patient's old medical records in [place] ? ?CC: COPD exacerbation ?HPI: ?71 year old white male with a history of COPD, coronary disease status post MI in September 2022, chronic hypoxic respite failure on home oxygen at 2 to 3 L a minute, history of A-fib, hyperlipidemia, hypertension, presents to the ER today from the PCP office.  Patient's not been taking his Breztri maintenance inhaler for COPD.  Patient had told his PCP not taking it because of cost. Noted to have hypoxia in the PCP office. Pt given duoneb and IM steroids. After duoneb, PCP, noted SVT. 911 called. In EMS, pt still in SVT. Pt given adenosine which helped break SVT.  ? ?PCP also noted a small cellulitis over the right ear where the oxygen tubing hooks around his ear. ? ?Pt is a poor historian. Cannot recall the events of today. ? ?On arrival to ER, temp 100.1, heart rate 101, blood pressure 128/72 satting 97% on 4 L. ? ?ABG pH 7.41 PCO2 of 51 PO2 of 80 on 4 L oxygen ? ?White count 8.8, hemoglobin 13.4, platelets 354 ? ?Sodium 138, testing 2.9 chloride of 93, bicarb of 33, BUN of 16, creatinine 1.2 ? ?BNP slightly elevated 186 ? ?COVID-negative flu negative. ? ?Patient had another episode of SVT in the ER.  This was captured on EKG.  Patient given IV potassium.  And IV magnesium. ? ?[facility] hospitalist contacted for admission.  ? ?ED Course: SVT again in ER. IV kcl and mag given. ? ?Review of Systems:  ?Review of Systems  ?Unable to perform ROS: Other  ?Poor historical recall. Pt blames his girlfriend for not giving him his meds. PCP states pt is the one not taking his meds. ? ?Past Medical History:  ?Diagnosis Date  ? ACS (acute coronary syndrome) ([place]) 04/19/2021  ? Alcohol abuse   ? Aortic  atherosclerosis ([place])   ? CAD in native artery   ? Chronic respiratory failure ([place])   ? COPD (chronic obstructive pulmonary disease) ([place])   ? DJD (degenerative joint disease)   ? diffusely  ? GERD (gastroesophageal reflux disease)   ? Hyperlipidemia   ? On home O2   ? Paroxysmal atrial fibrillation ([facility])   ? Pericardial effusion   ? Pneumonia due to COVID-19 virus   ? PUD (peptic ulcer disease)   ? with bleeding  ? Rotator cuff disorder   ? has been evaluated by Dr Clifton James and [facility]  ? Stroke (cerebrum) ([place])   ? ? ?Past Surgical History:  ?Procedure Laterality Date  ? COLONOSCOPY    ? EYE SURGERY    ? right eye removed at age 89  ? heart stent    ? x3- 2006  ? ? ? reports that he has quit smoking. His smoking use included cigarettes. He has a 40.00 pack-year smoking history. He has never used smokeless tobacco. He reports current alcohol use of about 30.0 standard drinks per week. He reports that he does not currently use drugs. ? ?No Known Allergies ? ?Family History  ?Problem Relation Age of Onset  ? Ovarian cancer Mother   ? Diabetes Maternal Grandfather   ? COPD Father   ? Diabetes Paternal Grandfather   ? Colon cancer Paternal Grandfather   ? Esophageal  cancer Neg Hx   ? Rectal cancer Neg Hx   ? Stomach cancer Neg Hx   ? ? ?Prior to Admission medications   ?Medication Sig Start Date End Date Taking? Authorizing Provider  ?albuterol (PROVENTIL) (2.5 MG/3ML) 0.083% nebulizer solution Take 3 mLs (2.5 mg total) by nebulization every 4 (four) hours as needed for wheezing or shortness of breath. 09/03/21  Yes [name], MD  ?albuterol (VENTOLIN HFA) 108 (90 Base) MCG/ACT inhaler Inhale 2 puffs into the lungs every 6 (six) hours as needed for wheezing or shortness of breath. 09/03/21  Yes [name], MD  ?apixaban (ELIQUIS) 5 MG TABS tablet Take 1 tablet (5 mg total) by mouth 2 (two) times daily. 09/03/21  Yes [name], MD  ?aspirin 81 MG tablet Take 81 mg by mouth at bedtime.   Yes  [provider]  ?atorvastatin (LIPITOR) 40 MG tablet Take 1 tablet (40 mg total) by mouth at bedtime. 05/21/21  Yes [name], MD  ?benzonatate (TESSALON PERLES) 100 MG capsule Take 1 capsule (100 mg total) by mouth 3 (three) times daily as needed for cough. 06/12/21  Yes Loel Dubonnet, NP  ?Budeson-Glycopyrrol-Formoterol (BREZTRI AEROSPHERE) 160-9-4.8 MCG/ACT AERO Inhale 2 puffs into the lungs 2 (two) times daily. 04/17/21  Yes [name], MD  ?Cholecalciferol (D3 PO) Take 1 tablet by mouth daily.   Yes [provider]  ?Cyanocobalamin (VITAMIN B-12 PO) Take 1 tablet by mouth daily.   Yes [provider]  ?isosorbide mononitrate (IMDUR) 30 MG 24 hr tablet Take 0.5 tablets (15 mg total) by mouth daily. 06/02/21  Yes [name], MD  ?LORazepam (ATIVAN) 0.5 MG tablet Take 1 tablet (0.5 mg total) by mouth 2 (two) times daily as needed for anxiety. 05/22/21  Yes [name], MD  ?magnesium oxide (MAG-OX) 400 (240 Mg) MG tablet Take 1 tablet (400 mg total) by mouth daily. 03/16/21  Yes Pokhrel, L[place]man, MD  ?metoprolol tartrate (LOPRESSOR) 25 MG tablet Take 1 tablet (25 mg total) by mouth 2 (two) times daily. 06/12/21  Yes Loel Dubonnet, NP  ?nitroGLYCERIN (NITROSTAT) 0.4 MG SL tablet DISSOLVE 1 TABLET UNDER THE TONGUE EVERY 5 MINUTES AS NEEDED FOR CHEST PAIN 05/21/21  Yes [name], MD  ?omeprazole (PRILOSEC) 40 MG capsule Take 1 capsule (40 mg total) by mouth daily. 03/19/21  Yes [name], MD  ?QUEtiapine (SEROQUEL) 25 MG tablet Take 1 tablet (25 mg total) by mouth at bedtime. 06/02/21  Yes [name], MD  ?Blood Glucose Calibration (TRUE METRIX LEVEL 1) Low SOLN USE AS DIRECTED. To monitor fsbs 2 times per day dx e11.9 06/02/21   [name], MD  ?Blood Glucose Monitoring Suppl (TRUE METRIX AIR GLUCOSE METER) DEVI USE AS DIRECTED. To monitor fsbs 2 times per day dx e11.9 06/02/21   [name], MD  ?cephALEXin (KEFLEX) 500 MG  capsule Take 1 capsule (500 mg total) by mouth 3 (three) times daily. ?Patient not taking: Reported on 11/04/2021 05/26/21   [name], MD  ?fluticasone [facility]) 50 MCG/ACT nasal spray Place 2 sprays into both nostrils daily. ?Patient not taking: Reported on 11/04/2021 05/21/21   [name], MD  ?folic acid (FOLVITE) 1 MG tablet TAKE 1 TABLET BY MOUTH EVERY DAY ?Patient not taking: Reported on 11/04/2021 05/21/21   [name], MD  ?glucose blood (RELION TRUE METRIX TEST STRIPS) test strip USE AS DIRECTED. To monitor fsbs 2 times  per day dx e11.9 06/02/21   [name], MD  ?Multiple Vitamin (MULTIVITAMIN WITH MINERALS) TABS tablet Take 1 tablet by mouth daily. ?Patient not taking: Reported on 11/04/2021 03/09/20   Kayleen Memos, DO  ?potassium chloride SA (KLOR-CON) 20 MEQ tablet Take 1 tablet (20 mEq total) by mouth daily for 4 days. ?Patient not taking: Reported on 11/04/2021 04/28/21 11/04/21  Petrucelli, Aldona Bar R, PA-C  ?predniSONE (DELTASONE) 20 MG tablet 3 tabs poqday 1-2, 2 tabs poqday 3-4, 1 tab poqday 5-6 ?Patient not taking: Reported on 11/04/2021 06/02/21   [name], MD  ?SSD 1 % cream APPLY 1 APPLICATION EVERY DAY ?Patient not taking: Reported on 11/04/2021 08/19/21   [name], MD  ?thiamine 100 MG tablet Take 5 tablets (500 mg total) by mouth daily. ?Patient not taking: Reported on 11/04/2021 03/17/21   Pokhrel, Corrie Mckusick, MD  ?traMADol (ULTRAM) 50 MG tablet TAKE 1 TABLET BY MOUTH EVERY 6 HOURS AS NEEDED FOR moderate PAIN ?Patient not taking: Reported on 11/04/2021 07/07/19   [name], MD  ?zinc gluconate 50 MG tablet Take 50 mg by mouth daily. ?Patient not taking: Reported on 11/04/2021    [provider]  ? ? ?Physical Exam: ?Vitals:  ? 11/04/21 2030 11/04/21 2045 11/04/21 2115 11/04/21 2130  ?BP: 136/75 127/68 131/71 124/71  ?Pulse: 91 89 96 86  ?Resp: '17 20 15 17  '$ ?Temp:      ?TempSrc:      ?SpO2: 95% 97% 97% 97%  ?Weight:      ?Height:       ? ? ?Physical Exam ?Vitals and nursing note reviewed.  ?Constitutional:   ?   General: He is not in acute distress. ?   Appearance: He is not toxic-appearing or diaphoretic.  ?   Comments: Chronically ill-appearing.  Older than stated

## 2021-11-04 NOTE — ED Provider Notes (Signed)
?Zachary Chambers ?Provider Note ? ? ?CSN: [id number] ?Arrival date & time: 11/04/21  1800 ? ?  ? ?History ? ?Chief Complaint  ?Patient presents with  ? Palpitations  ?  Patient arrives from primary care doctor's office due to palpitations. Patient started feeling bad around 11AM this morning. Primary care thought patient was experiencing COPD exacerbation so he received a duoneb with no relief so they called EMS. When EMS got there patient was found to be in SVT with a HR of 176. EMS gave a total of 18 of adenosine and patient converted back to SR.  ? ? ?Zachary Chambers is a 71 y.o. male. ? ? ?Palpitations ? ?Patient has history of multiple medical problems including peptic ulcer disease, GERD, hyperlipidemia, stroke, paroxysmal A-fib, chronic respiratory failure on home O2 and COPD.  Patient presents to the ED with complaints of cough and congestion.  Patient symptoms have been going on for couple days.  He went to his primary care doctor's office today and they noted he was wheezing.  He also also tachycardic and seemed somewhat confused.  Patient was sent to the ED for COPD evaluation.  While at the office patient was given a dose of steroids as well as a DuoNeb treatment.  Records were reviewed from that office visit today. ? ?Patient does admit to having some shortness of breath.  He has felt somewhat feverish.  He denies any chest pain.  No headache. ? ?Home Medications ?Prior to Admission medications   ?Medication Sig Start Date End Date Taking? Authorizing Provider  ?albuterol (PROVENTIL) (2.5 MG/3ML) 0.083% nebulizer solution Take 3 mLs (2.5 mg total) by nebulization every 4 (four) hours as needed for wheezing or shortness of breath. 09/03/21   [name], MD  ?albuterol (VENTOLIN HFA) 108 (90 Base) MCG/ACT inhaler Inhale 2 puffs into the lungs every 6 (six) hours as needed for wheezing or shortness of breath. 09/03/21   [name], MD  ?apixaban (ELIQUIS) 5 MG TABS  tablet Take 1 tablet (5 mg total) by mouth 2 (two) times daily. 09/03/21   [name], MD  ?aspirin 81 MG tablet Take 81 mg by mouth at bedtime.    [provider]  ?atorvastatin (LIPITOR) 40 MG tablet Take 1 tablet (40 mg total) by mouth at bedtime. 05/21/21   [name], MD  ?benzonatate (TESSALON PERLES) 100 MG capsule Take 1 capsule (100 mg total) by mouth 3 (three) times daily as needed for cough. 06/12/21   Loel Dubonnet, NP  ?Blood Glucose Calibration (TRUE METRIX LEVEL 1) Low SOLN USE AS DIRECTED. To monitor fsbs 2 times per day dx e11.9 06/02/21   [name], MD  ?Blood Glucose Monitoring Suppl (TRUE METRIX AIR GLUCOSE METER) DEVI USE AS DIRECTED. To monitor fsbs 2 times per day dx e11.9 06/02/21   [name], MD  ?Budeson-Glycopyrrol-Formoterol (BREZTRI AEROSPHERE) 160-9-4.8 MCG/ACT AERO Inhale 2 puffs into the lungs 2 (two) times daily. 04/17/21   [name], MD  ?cephALEXin (KEFLEX) 500 MG capsule Take 1 capsule (500 mg total) by mouth 3 (three) times daily. 05/26/21   [name], MD  ?Cholecalciferol (D3 PO) Take 1 tablet by mouth daily.    [provider]  ?Cyanocobalamin (VITAMIN B-12 PO) Take 1 tablet by mouth daily.    [provider]  ?fluticasone (FLONASE) 50 MCG/ACT nasal spray Place 2 sprays into both nostrils daily. 05/21/21   [name], MD  ?  folic acid (FOLVITE) 1 MG tablet TAKE 1 TABLET BY MOUTH EVERY DAY 05/21/21   [name], MD  ?glucose blood (RELION TRUE METRIX TEST STRIPS) test strip USE AS DIRECTED. To monitor fsbs 2 times per day dx e11.9 06/02/21   [name], MD  ?isosorbide mononitrate (IMDUR) 30 MG 24 hr tablet Take 0.5 tablets (15 mg total) by mouth daily. 06/02/21   [name], MD  ?LORazepam (ATIVAN) 0.5 MG tablet Take 1 tablet (0.5 mg total) by mouth 2 (two) times daily as needed for anxiety. 05/22/21   [name], MD  ?magnesium oxide (MAG-OX) 400 (240 Mg) MG tablet Take 1  tablet (400 mg total) by mouth daily. 03/16/21   Pokhrel, Corrie Mckusick, MD  ?metoprolol tartrate (LOPRESSOR) 25 MG tablet Take 1 tablet (25 mg total) by mouth 2 (two) times daily. 06/12/21   Loel Dubonnet, NP  ?Multiple Vitamin (MULTIVITAMIN WITH MINERALS) TABS tablet Take 1 tablet by mouth daily. 03/09/20   Kayleen Memos, DO  ?nitroGLYCERIN (NITROSTAT) 0.4 MG SL tablet DISSOLVE 1 TABLET UNDER THE TONGUE EVERY 5 MINUTES AS NEEDED FOR CHEST PAIN 05/21/21   [name], MD  ?omeprazole (PRILOSEC) 40 MG capsule Take 1 capsule (40 mg total) by mouth daily. 03/19/21   [name], MD  ?potassium chloride SA (KLOR-CON) 20 MEQ tablet Take 1 tablet (20 mEq total) by mouth daily for 4 days. 04/28/21 05/02/21  Petrucelli, Samantha R, PA-C  ?predniSONE (DELTASONE) 20 MG tablet 3 tabs poqday 1-2, 2 tabs poqday 3-4, 1 tab poqday 5-6 06/02/21   [name], MD  ?QUEtiapine (SEROQUEL) 25 MG tablet Take 1 tablet (25 mg total) by mouth at bedtime. 06/02/21   [name], MD  ?SSD 1 % cream APPLY 1 APPLICATION EVERY DAY 7/0/01   [name], MD  ?thiamine 100 MG tablet Take 5 tablets (500 mg total) by mouth daily. 03/17/21   Pokhrel, Corrie Mckusick, MD  ?traMADol (ULTRAM) 50 MG tablet TAKE 1 TABLET BY MOUTH EVERY 6 HOURS AS NEEDED FOR moderate PAIN 07/07/19   [name], MD  ?zinc gluconate 50 MG tablet Take 50 mg by mouth daily.    [provider]  ?   ? ?Allergies    ?Patient has no known allergies.   ? ?Review of Systems   ?Review of Systems  ?Constitutional:  Positive for fatigue.  ?Cardiovascular:  Positive for palpitations.  ? ?Physical Exam ?Updated Vital Signs ?BP 127/68   Pulse 89   Temp 99.2 ?F (37.3 ?C) (Oral)   Resp 20   Ht 1.753 m ('5\' 9"'$ )   Wt 54.4 kg   SpO2 97%   BMI 17.72 kg/m?  ?Physical Exam ?Vitals and nursing note reviewed.  ?Constitutional:   ?   Appearance: He is well-developed. He is ill-appearing.  ?   Comments: Elderly frail  ?HENT:  ?   Head: Normocephalic and atraumatic.  ?    Right Ear: External ear normal.  ?   Left Ear: External ear normal.  ?Eyes:  ?   General: No scleral icterus.    ?   Right eye: No discharge.     ?   Left eye: No discharge.  ?   Conjunctiva/sclera: Conjunctivae normal.  ?Neck:  ?   Trachea: No tracheal deviation.  ?Cardiovascular:  ?   Rate and Rhythm: Normal rate and regular rhythm.  ?Pulmonary:  ?   Effort: No respiratory distress.  ?   Breath sounds:  No stridor. Wheezing present. No rales.  ?   Comments: Increased work of breathing, decreased breath sounds ?Abdominal:  ?   General: Bowel sounds are normal. There is no distension.  ?   Palpations: Abdomen is soft.  ?   Tenderness: There is no abdominal tenderness. There is no guarding or rebound.  ?Musculoskeletal:     ?   General: No tenderness or deformity.  ?   Cervical back: Neck supple.  ?Skin: ?   General: Skin is warm and dry.  ?   Findings: No rash.  ?Neurological:  ?   General: No focal deficit present.  ?   Mental Status: He is alert and oriented to person, place, and time.  ?   Cranial Nerves: No cranial nerve deficit (no facial droop, extraocular movements intact, no slurred speech).  ?   Sensory: No sensory deficit.  ?   Motor: No abnormal muscle tone or seizure activity.  ?   Coordination: Coordination normal.  ?Psychiatric:     ?   Mood and Affect: Mood normal.  ? ? ?ED Results / Procedures / Treatments   ?Labs ?(all labs ordered are listed, but only abnormal results are displayed) ?Labs Reviewed  ?CBC - Abnormal; Notable for the following components:  ?    Result Value  ? MCV 75.7 (*)   ? MCH 23.6 (*)   ? RDW 18.9 (*)   ? All other components within normal limits  ?BASIC METABOLIC PANEL - Abnormal; Notable for the following components:  ? Potassium 2.9 (*)   ? Chloride 93 (*)   ? CO2 33 (*)   ? Glucose, Bld 115 (*)   ? Creatinine, Ser 1.27 (*)   ? Calcium 8.8 (*)   ? All other components within normal limits  ?BRAIN NATRIURETIC PEPTIDE - Abnormal; Notable for the following components:  ? B  Natriuretic Peptide 186.1 (*)   ? All other components within normal limits  ?I-STAT ARTERIAL BLOOD GAS, ED - Abnormal; Notable for the following components:  ? pCO2 arterial 51.8 (*)   ? pO2, Arterial 80 (*)   ?

## 2021-11-04 NOTE — Assessment & Plan Note (Addendum)
Continue PPI ?

## 2021-11-04 NOTE — ED Notes (Signed)
RT at bedside.

## 2021-11-04 NOTE — Subjective & Objective (Signed)
CC: COPD exacerbation ?HPI: ?71 year old white male with a history of COPD, coronary disease status post MI in September 2022, chronic hypoxic respite failure on home oxygen at 2 to 3 L a minute, history of A-fib, hyperlipidemia, hypertension, presents to the ER today from the PCP office.  Patient's not been taking his Breztri maintenance inhaler for COPD.  Patient had told his PCP not taking it because of cost. Noted to have hypoxia in the PCP office. Pt given duoneb and IM steroids. After duoneb, PCP, noted SVT. 911 called. In EMS, pt still in SVT. Pt given adenosine which helped break SVT.  ? ?PCP also noted a small cellulitis over the right ear where the oxygen tubing hooks around his ear. ? ?Pt is a poor historian. Cannot recall the events of today. ? ?On arrival to ER, temp 100.1, heart rate 101, blood pressure 128/72 satting 97% on 4 L. ? ?ABG pH 7.41 PCO2 of 51 PO2 of 80 on 4 L oxygen ? ?White count 8.8, hemoglobin 13.4, platelets 354 ? ?Sodium 138, testing 2.9 chloride of 93, bicarb of 33, BUN of 16, creatinine 1.2 ? ?BNP slightly elevated 186 ? ?COVID-negative flu negative. ? ?Patient had another episode of SVT in the ER.  This was captured on EKG.  Patient given IV potassium.  And IV magnesium. ? ?[facility] hospitalist contacted for admission. ?

## 2021-11-04 NOTE — Progress Notes (Signed)
? ?Subjective:  ? ? Patient ID: Zachary Chambers, male    DOB: 1950-08-21, 71 y.o.   MRN: [id number] ? ?HPI ?05/2021 ?Since I last saw the patient, he has been admitted to the hospital with acute coronary syndrome.  Cardiology recommended medical management.  He is supposed to be on isosorbide mononitrate, 15 mg daily.  He states that he is taking that.  He is also refraining from smoking and consistently using his oxygen 2 to 2-1/2 L by nasal cannula.  Unfortunately due to cost, he is no longer using his Breztri.  He tries to save his medication in case he really needs it.  He is also not using his albuterol.  I explained to both he and his girlfriend multiple times that [name] is a maintenance medication and the albuterol is a rescue medication and how and when to take them.  I also explained to him that respiratory distress will increase strain on his heart and likely cause repeat acute coronary syndrome or even death.  Therefore the best way to prevent repeat hospitalizations would be to be compliant with the [facility].  He also complains of pain behind his right ear.  In the area where the oxygen tubing sits, there is an erythematous patch of skin that is bright red and painful.  Is hot to the touch.  Is exquisitely tender.  There is also a scab that is oozing in the crease between his ear and his skull.  There is yellow honey light crust adherent to this and it is painful to the touch.  I believe he has a secondary infection in that area.  There is also a oval like fleshy papule on the superior portion of his ear that is soft and spongy and has a a red purpleish hue.  At that time, my plan was: ? ?His noncompliance with [name] has led to a COPD exacerbation.  Begin prednisone taper pack.  Resume Breztri 2 puffs inhaled twice daily.  I gave the patient 4 samples to try to improve compliance.  Treat the cellulitis behind the right ear with Keflex 500 mg 3 times a day for 7 days.  Apply Silvadene to the impetigo like  honey colored crust/wound behind the right ear.  Recheck in 10 to 14 days to see if it is healing.  Monitor the lesion on the superior aspect of the ear.  I believe that this might be a pyogenic granuloma versus malignancy.  Patient is too frail right now to undergo biopsy and treatment therefore I will simply monitor this. ? ?11/04/21 ?Stress test in 2021 showed a small reversible apical defect.  Echo in 2022 showed EF 65-70%.  However, has end stage COPD with oxygen dependent respiratory failure complicated by non compliance with medical regimen.  I have not seen the patient since 05/2021.  He is here today with is significant other.  He is so weak, he fell getting into the car.   ?Girlfriend states that the patient has become combative recently.  He stopped taking his medications.  He is fighting with them and they try to give him his medication including his breathing treatment.  He is also stopped eating..  Today on arrival he was satting 65% on room air.  On 2 L, his pulse oximetry was 74%.  Patient's heart rate was 170 but with regular.  Patient was placed on 6 L of oxygen.  He was given a DuoNeb.  After the DuoNeb and on 6 L of oxygen,  his oxygen saturations climbed to 95%.  We were able to wean the patient back to 4 L but we called EMS.  Patient received 80 mg of Depo-Medrol IM x1 around 430 ?Past Medical History:  ?Diagnosis Date  ? Alcohol abuse   ? Aortic atherosclerosis ([place])   ? CAD in native artery   ? Chronic respiratory failure ([place])   ? COPD (chronic obstructive pulmonary disease) ([place])   ? DJD (degenerative joint disease)   ? diffusely  ? GERD (gastroesophageal reflux disease)   ? Hyperlipidemia   ? On home O2   ? Paroxysmal atrial fibrillation ([facility])   ? Pericardial effusion   ? Pneumonia due to COVID-19 virus   ? PUD (peptic ulcer disease)   ? with bleeding  ? Rotator cuff disorder   ? has been evaluated by Dr Clifton James and [facility]  ? Stroke (cerebrum) ([place])   ? ? ?Past Surgical History:   ?Procedure Laterality Date  ? COLONOSCOPY    ? EYE SURGERY    ? right eye removed at age 3  ? heart stent    ? x3- 2006  ? ?Current Outpatient Medications on File Prior to Visit  ?Medication Sig Dispense Refill  ? albuterol (PROVENTIL) (2.5 MG/3ML) 0.083% nebulizer solution Take 3 mLs (2.5 mg total) by nebulization every 4 (four) hours as needed for wheezing or shortness of breath. 180 mL 4  ? albuterol (VENTOLIN HFA) 108 (90 Base) MCG/ACT inhaler Inhale 2 puffs into the lungs every 6 (six) hours as needed for wheezing or shortness of breath. 18 g 5  ? apixaban (ELIQUIS) 5 MG TABS tablet Take 1 tablet (5 mg total) by mouth 2 (two) times daily. 180 tablet 3  ? aspirin 81 MG tablet Take 81 mg by mouth at bedtime.    ? atorvastatin (LIPITOR) 40 MG tablet Take 1 tablet (40 mg total) by mouth at bedtime. 90 tablet 3  ? benzonatate (TESSALON PERLES) 100 MG capsule Take 1 capsule (100 mg total) by mouth 3 (three) times daily as needed for cough. 20 capsule 0  ? Blood Glucose Calibration (TRUE METRIX LEVEL 1) Low SOLN USE AS DIRECTED. To monitor fsbs 2 times per day dx e11.9 (Patient not taking: Reported on 08/01/2021) 1 each 3  ? Blood Glucose Monitoring Suppl (TRUE METRIX AIR GLUCOSE METER) DEVI USE AS DIRECTED. To monitor fsbs 2 times per day dx e11.9 (Patient not taking: Reported on 08/01/2021) 1 each 3  ? Budeson-Glycopyrrol-Formoterol (BREZTRI AEROSPHERE) 160-9-4.8 MCG/ACT AERO Inhale 2 puffs into the lungs 2 (two) times daily. 17.7 g 3  ? cephALEXin (KEFLEX) 500 MG capsule Take 1 capsule (500 mg total) by mouth 3 (three) times daily. 21 capsule 0  ? Cholecalciferol (D3 PO) Take 1 tablet by mouth daily.    ? Cyanocobalamin (VITAMIN B-12 PO) Take 1 tablet by mouth daily.    ? fluticasone (FLONASE) 50 MCG/ACT nasal spray Place 2 sprays into both nostrils daily. 48 g 3  ? folic acid (FOLVITE) 1 MG tablet TAKE 1 TABLET BY MOUTH EVERY DAY 90 tablet 3  ? glucose blood (RELION TRUE METRIX TEST STRIPS) test strip USE AS  DIRECTED. To monitor fsbs 2 times per day dx e11.9 (Patient not taking: Reported on 08/01/2021) 100 each 3  ? isosorbide mononitrate (IMDUR) 30 MG 24 hr tablet Take 0.5 tablets (15 mg total) by mouth daily. 45 tablet 3  ? LORazepam (ATIVAN) 0.5 MG tablet Take 1 tablet (0.5 mg total) by mouth 2 (two)  times daily as needed for anxiety. 180 tablet 0  ? magnesium oxide (MAG-OX) 400 (240 Mg) MG tablet Take 1 tablet (400 mg total) by mouth daily. 100 tablet 0  ? metoprolol tartrate (LOPRESSOR) 25 MG tablet Take 1 tablet (25 mg total) by mouth 2 (two) times daily. 180 tablet 3  ? Multiple Vitamin (MULTIVITAMIN WITH MINERALS) TABS tablet Take 1 tablet by mouth daily. 90 tablet 0  ? nitroGLYCERIN (NITROSTAT) 0.4 MG SL tablet DISSOLVE 1 TABLET UNDER THE TONGUE EVERY 5 MINUTES AS NEEDED FOR CHEST PAIN 25 tablet 0  ? omeprazole (PRILOSEC) 40 MG capsule Take 1 capsule (40 mg total) by mouth daily. 90 capsule 3  ? potassium chloride SA (KLOR-CON) 20 MEQ tablet Take 1 tablet (20 mEq total) by mouth daily for 4 days. 4 tablet 0  ? predniSONE (DELTASONE) 20 MG tablet 3 tabs poqday 1-2, 2 tabs poqday 3-4, 1 tab poqday 5-6 12 tablet 0  ? QUEtiapine (SEROQUEL) 25 MG tablet Take 1 tablet (25 mg total) by mouth at bedtime. 90 tablet 3  ? SSD 1 % cream APPLY 1 APPLICATION EVERY DAY 50 g 0  ? thiamine 100 MG tablet Take 5 tablets (500 mg total) by mouth daily. 100 tablet 0  ? traMADol (ULTRAM) 50 MG tablet TAKE 1 TABLET BY MOUTH EVERY 6 HOURS AS NEEDED FOR moderate PAIN 30 tablet 0  ? zinc gluconate 50 MG tablet Take 50 mg by mouth daily.    ? ?No current facility-administered medications on file prior to visit.  ? ?No Known Allergies ?Social History  ? ?Socioeconomic History  ? Marital status: Single  ?  Spouse name: Not on file  ? Number of children: Not on file  ? Years of education: Not on file  ? Highest education level: Not on file  ?Occupational History  ? Occupation: retired  ?Tobacco Use  ? Smoking status: Former  ?  Packs/day:  1.00  ?  Years: 40.00  ?  Pack years: 40.00  ?  Types: Cigarettes  ? Smokeless tobacco: Never  ? Tobacco comments:  ?  declines patch  ?Vaping Use  ? Vaping Use: Never used  ?Substance and Sexual Activity  ? Alcoho

## 2021-11-04 NOTE — ED Notes (Signed)
5w called to initiate purple man  ?

## 2021-11-04 NOTE — Assessment & Plan Note (Addendum)
Severe end stage COPD, now with rest symptoms and cough, and wheezing, and dyspnea with any exertion.   ? ?FEV1 43% 10 years ago, clearly worse now.  Notes indicate nonadherence to home inhalers. ? ?Still desatted to 85% with ambulation today ?- Continue Solumedrol ?- Continue azithromycin ?- Continue Mucinex, flutter ?- Schedule and PRN bronchodilators ?- Continue PPI ? ?- Consult Palliative Care ? ? ? ?

## 2021-11-04 NOTE — Assessment & Plan Note (Addendum)
BP normal ?-Continue metop, Imdur ?

## 2021-11-04 NOTE — Assessment & Plan Note (Signed)
On lipitor

## 2021-11-04 NOTE — Assessment & Plan Note (Addendum)
S/p adenosine in EMS.  ?Here, this is brief , 2-10 minutes, asymptomatic and hemodynamically stable.  Not the primary issue. ? ?He carries a chart diagnosis of AF as well, and is on Eliquis ? ?No further SVT here on telemetry overnight ?- Keep K>4 and Mg >2 ?- Increase metoprolol ?-Continue home Eliquis ?

## 2021-11-05 DIAGNOSIS — I2510 Atherosclerotic heart disease of native coronary artery without angina pectoris: Secondary | ICD-10-CM | POA: Diagnosis present

## 2021-11-05 DIAGNOSIS — I700 Atherosclerosis of aorta: Secondary | ICD-10-CM | POA: Diagnosis present

## 2021-11-05 DIAGNOSIS — I10 Essential (primary) hypertension: Secondary | ICD-10-CM | POA: Diagnosis present

## 2021-11-05 DIAGNOSIS — Z7982 Long term (current) use of aspirin: Secondary | ICD-10-CM | POA: Diagnosis not present

## 2021-11-05 DIAGNOSIS — K219 Gastro-esophageal reflux disease without esophagitis: Secondary | ICD-10-CM | POA: Diagnosis present

## 2021-11-05 DIAGNOSIS — Z681 Body mass index (BMI) 19 or less, adult: Secondary | ICD-10-CM | POA: Diagnosis not present

## 2021-11-05 DIAGNOSIS — J441 Chronic obstructive pulmonary disease with (acute) exacerbation: Secondary | ICD-10-CM | POA: Diagnosis present

## 2021-11-05 DIAGNOSIS — Z8041 Family history of malignant neoplasm of ovary: Secondary | ICD-10-CM | POA: Diagnosis not present

## 2021-11-05 DIAGNOSIS — I480 Paroxysmal atrial fibrillation: Secondary | ICD-10-CM | POA: Diagnosis present

## 2021-11-05 DIAGNOSIS — F32A Depression, unspecified: Secondary | ICD-10-CM

## 2021-11-05 DIAGNOSIS — R4189 Other symptoms and signs involving cognitive functions and awareness: Secondary | ICD-10-CM | POA: Diagnosis not present

## 2021-11-05 DIAGNOSIS — E43 Unspecified severe protein-calorie malnutrition: Secondary | ICD-10-CM | POA: Diagnosis present

## 2021-11-05 DIAGNOSIS — Z8711 Personal history of peptic ulcer disease: Secondary | ICD-10-CM | POA: Diagnosis not present

## 2021-11-05 DIAGNOSIS — J9611 Chronic respiratory failure with hypoxia: Secondary | ICD-10-CM | POA: Diagnosis present

## 2021-11-05 DIAGNOSIS — Z8673 Personal history of transient ischemic attack (TIA), and cerebral infarction without residual deficits: Secondary | ICD-10-CM | POA: Diagnosis not present

## 2021-11-05 DIAGNOSIS — I471 Supraventricular tachycardia: Secondary | ICD-10-CM | POA: Diagnosis present

## 2021-11-05 DIAGNOSIS — Z7901 Long term (current) use of anticoagulants: Secondary | ICD-10-CM | POA: Diagnosis not present

## 2021-11-05 DIAGNOSIS — Z7951 Long term (current) use of inhaled steroids: Secondary | ICD-10-CM | POA: Diagnosis not present

## 2021-11-05 DIAGNOSIS — F0390 Unspecified dementia without behavioral disturbance: Secondary | ICD-10-CM | POA: Diagnosis present

## 2021-11-05 DIAGNOSIS — E876 Hypokalemia: Secondary | ICD-10-CM | POA: Diagnosis present

## 2021-11-05 DIAGNOSIS — E785 Hyperlipidemia, unspecified: Secondary | ICD-10-CM | POA: Diagnosis present

## 2021-11-05 DIAGNOSIS — J9612 Chronic respiratory failure with hypercapnia: Secondary | ICD-10-CM | POA: Diagnosis present

## 2021-11-05 DIAGNOSIS — Z8616 Personal history of COVID-19: Secondary | ICD-10-CM | POA: Diagnosis not present

## 2021-11-05 DIAGNOSIS — I252 Old myocardial infarction: Secondary | ICD-10-CM | POA: Diagnosis not present

## 2021-11-05 DIAGNOSIS — Z9981 Dependence on supplemental oxygen: Secondary | ICD-10-CM | POA: Diagnosis not present

## 2021-11-05 DIAGNOSIS — Z20822 Contact with and (suspected) exposure to covid-19: Secondary | ICD-10-CM | POA: Diagnosis present

## 2021-11-05 LAB — COMPREHENSIVE METABOLIC PANEL
ALT: 13 U/L (ref 0–44)
AST: 31 U/L (ref 15–41)
Albumin: 3.1 g/dL — ABNORMAL LOW (ref 3.5–5.0)
Alkaline Phosphatase: 78 U/L (ref 38–126)
Anion gap: 10 (ref 5–15)
BUN: 14 mg/dL (ref 8–23)
CO2: 31 mmol/L (ref 22–32)
Calcium: 8.6 mg/dL — ABNORMAL LOW (ref 8.9–10.3)
Chloride: 95 mmol/L — ABNORMAL LOW (ref 98–111)
Creatinine, Ser: 1.05 mg/dL (ref 0.61–1.24)
GFR, Estimated: >60 mL/min (ref >60)
Glucose, Bld: 126 mg/dL — ABNORMAL HIGH (ref 70–99)
Potassium: 3.6 mmol/L (ref 3.5–5.1)
Sodium: 136 mmol/L (ref 135–145)
Total Bilirubin: 0.8 mg/dL (ref 0.3–1.2)
Total Protein: 5.9 g/dL — ABNORMAL LOW (ref 6.5–8.1)

## 2021-11-05 LAB — CBC WITH DIFFERENTIAL/PLATELET
Abs Immature Granulocytes: 0.06 10*3/uL (ref 0.00–0.07)
Basophils Absolute: 0.1 10*3/uL (ref 0.0–0.1)
Basophils Relative: 1 %
Eosinophils Absolute: 0 10*3/uL (ref 0.0–0.5)
Eosinophils Relative: 1 %
HCT: 39.4 % (ref 39.0–52.0)
Hemoglobin: 12.1 g/dL — ABNORMAL LOW (ref 13.0–17.0)
Immature Granulocytes: 1 %
Lymphocytes Relative: 8 %
Lymphs Abs: 0.4 10*3/uL — ABNORMAL LOW (ref 0.7–4.0)
MCH: 23 pg — ABNORMAL LOW (ref 26.0–34.0)
MCHC: 30.7 g/dL (ref 30.0–36.0)
MCV: 74.9 fL — ABNORMAL LOW (ref 80.0–100.0)
Monocytes Absolute: 1 10*3/uL (ref 0.1–1.0)
Monocytes Relative: 20 %
Neutro Abs: 3.6 10*3/uL (ref 1.7–7.7)
Neutrophils Relative %: 69 %
Platelets: 334 10*3/uL (ref 150–400)
RBC: 5.26 MIL/uL (ref 4.22–5.81)
RDW: 18.3 % — ABNORMAL HIGH (ref 11.5–15.5)
WBC: 5.2 10*3/uL (ref 4.0–10.5)
nRBC: 0 % (ref 0.0–0.2)

## 2021-11-05 LAB — MAGNESIUM: Magnesium: 2.1 mg/dL (ref 1.7–2.4)

## 2021-11-05 MED ORDER — AZITHROMYCIN 500 MG PO TABS
250.0000 mg | ORAL_TABLET | Freq: Every day | ORAL | Status: DC
  Administered 2021-11-06 – 2021-11-08 (×3): 250 mg via ORAL
  Filled 2021-11-05 (×3): qty 1

## 2021-11-05 MED ORDER — METOPROLOL TARTRATE 50 MG PO TABS
50.0000 mg | ORAL_TABLET | Freq: Two times a day (BID) | ORAL | Status: DC
  Administered 2021-11-05 – 2021-11-08 (×6): 50 mg via ORAL
  Filled 2021-11-05 (×6): qty 1

## 2021-11-05 MED ORDER — METHYLPREDNISOLONE SODIUM SUCC 125 MG IJ SOLR
80.0000 mg | INTRAMUSCULAR | Status: DC
  Administered 2021-11-05 – 2021-11-08 (×4): 80 mg via INTRAVENOUS
  Filled 2021-11-05 (×4): qty 2

## 2021-11-05 MED ORDER — GUAIFENESIN ER 600 MG PO TB12
600.0000 mg | ORAL_TABLET | Freq: Two times a day (BID) | ORAL | Status: DC
  Administered 2021-11-05 – 2021-11-08 (×7): 600 mg via ORAL
  Filled 2021-11-05 (×7): qty 1

## 2021-11-05 MED ORDER — POTASSIUM CHLORIDE CRYS ER 20 MEQ PO TBCR
40.0000 meq | EXTENDED_RELEASE_TABLET | Freq: Once | ORAL | Status: AC
Start: 2021-11-05 — End: 2021-11-05
  Administered 2021-11-05: 40 meq via ORAL
  Filled 2021-11-05: qty 2

## 2021-11-05 MED ORDER — IPRATROPIUM-ALBUTEROL 0.5-2.5 (3) MG/3ML IN SOLN
3.0000 mL | RESPIRATORY_TRACT | Status: DC | PRN
  Administered 2021-11-05 – 2021-11-06 (×2): 3 mL via RESPIRATORY_TRACT
  Filled 2021-11-05 (×2): qty 3

## 2021-11-05 MED ORDER — LEVALBUTEROL HCL 0.63 MG/3ML IN NEBU
0.6300 mg | INHALATION_SOLUTION | Freq: Three times a day (TID) | RESPIRATORY_TRACT | Status: DC
  Administered 2021-11-05: 0.63 mg via RESPIRATORY_TRACT
  Filled 2021-11-05 (×3): qty 3

## 2021-11-05 MED ORDER — AZITHROMYCIN 500 MG PO TABS
500.0000 mg | ORAL_TABLET | Freq: Every day | ORAL | Status: AC
  Administered 2021-11-05: 500 mg via ORAL
  Filled 2021-11-05: qty 1

## 2021-11-05 NOTE — Hospital Course (Addendum)
Mr. Belger is a 71 y.o. 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 ? ?Per PCP notes:  ?"Girlfriend states that the patient has become combative recently.  He stopped taking his medications.  He is fighting with them and they try to give him his medications including his breathing treatment.  He is also stopped eating.  Today on arrival he was satting 65% on room air." And fell getting out of the car. ? ?In the ER, CXR clear, required 4L O2, seemed confused.  Noted to have another episode of SVT. ? ? ?3/21: Admitted for COPD ?3/22: Desatted to 75% with ambulation just 10 feet, very winded, palliative consulted ?3/23: Still marked desaturations to 84% with ambulation short distance, few feet ?

## 2021-11-05 NOTE — Assessment & Plan Note (Signed)
-   Consult dietitian 

## 2021-11-05 NOTE — Assessment & Plan Note (Signed)
Unclear if this is acute metabolic encephalopathy from hypoxia or dementia or both. ? ?Baseline seems to be one of advancing memory loss per PCP notes.  Here, he is quite disoriented, and I am uncertain how different he is from his baseline. ?- Treat underlying COPD ?

## 2021-11-05 NOTE — Progress Notes (Signed)
Ambulated patient on O2 with Tori, NT assisting. Patient able to ambulate approximately 10 feet on 2L nasal cannula before becoming SOB, O2 sats decreased to 76%. Ambulated back to chair and encouraged deep breathing, O2 sat recovered to 94-95% on 2L. Dr. Loleta Books made aware.  ?

## 2021-11-05 NOTE — Progress Notes (Signed)
?Progress Note ? ? ?Patient: Zachary Chambers GYK:[id number] DOB: [birth year] DOA: 11/04/2021     0 ?DOS: the patient was seen and examined on 11/05/2021 ?  ? ? ? ? ?Brief hospital course: ?Zachary Chambers is a 71 y.o. 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 ? ?Per PCP notes:  ?"Girlfriend states that the patient has become combative recently.  He stopped taking his medications.  He is fighting with them and they try to give him his medications including his breathing treatment.  He is also stopped eating.  Today on arrival he was satting 65% on room air." And fell getting out of the car. ? ?In the ER, CXR clear, required 4L O2, seemed confused.  Noted to have another episode of SVT. ? ? ?3/21: Admitted for COPD ?3/22: Desatted to 75% with ambulation just 10 feet, very winded, palliative consulted ? ? ? ? ? ? ? ?Assessment and Plan: ?* COPD exacerbation ([place]) ?Severe end stage COPD, now with rest symptoms and cough, and wheezing, and dyspnea with any exertion.   ? ?FEV1 43% 10 years ago, clearly worse now.  Notes indicate nonadherence to home inhalers. ?- Escalate to Solumedrol ?- Start azithromycin ?- Start Mucinex, flutter ?- Schedule and PRN bronchodilators ?-Continue PPI ? ?- Consult Palliative Care ? ? ? ? ?Impetigo ?Right ear. PCP wanted to treat with po keflex. ? ?Cognitive impairment ?Unclear if this is acute metabolic encephalopathy from hypoxia or dementia or both. ? ?Baseline seems to be one of advancing memory loss per PCP notes.  Here, he is quite disoriented, and I am uncertain how different he is from his baseline. ?- Treat underlying COPD ? ?PSVT (paroxysmal supraventricular tachycardia) ([place]) ?S/p adenosine in EMS.   ?Here, this is brief , 2-10 minutes, asymptomatic and hemodynamically stable.  Not the primary issue. ? ?He carries a chart diagnosis of AF as well, and is on Eliquis ?- Keep K>4 and Mg >2 ?- Increase metoprolol ?-Continue home Eliquis ? ?Primary hypertension ?BP normal ?-Continue metop, Imdur ? ?Hypokalemia ?- Repeat K given SVT ? ?PAF (paroxysmal atrial fibrillation) ([place]) ?Continue lopressor, eliquis ? ?Chronic respiratory failure with hypoxia and hypercapnia (HCC) - 2 L/min at rest, 5 L/min with activity, baseline POC2 50 mmHg ?Impetigo ruled out  ? ?Protein-calorie malnutrition, severe ?- Consult dietitian ? ?Hyperlipidemia ?On lipitor. ? ?GERD (gastroesophageal reflux disease) ?-Continue PPI ? ?CAD (coronary artery disease) ?-Continue aspirin, Eliquis, lipitor, Imdur, metoprolol ? ? ? ? ?  ? ?Subjective: Patient has wheezing, cough, chest tightness, dyspnea at rest.  With ambulating just a few steps he desaturates to 75%.  Reports that he feels a little confused.  No chest pain, headache. ? ? ? ?Physical Exam: ?Vitals:  ? 11/05/21 0500 11/05/21 0746 11/05/21 0751 11/05/21 1216  ?BP:   137/72   ?Pulse:      ?Resp:    20  ?Temp:   97.8 ?F (36.6 ?C) 98 ?F (36.7 ?C)  ?TempSrc:   Axillary Oral  ?SpO2:  99%    ?Weight: 54.4 kg     ?Height:      ? ?Cachectic elderly adult male, appears somewhat disheveled, out of breath at rest.  Speaking in short sentences.  Mostly attentive to our conversation. ?Heart rate regular, no systolic murmurs, no JVD, no peripheral edema ?Respiratory effort increased, he has pursed lip breathing, he is wheezy and rhonchorous bilaterally, poor air movement, barrel chested ?Abdomen soft no tenderness palpation or guarding ?Attention somewhat diminished, affect distracted, moves upper extremities with generalized strength, speech fluent, face symmetric ? ? ? ? ?Data Reviewed: ?Nursing notes reviewed, vital signs reviewed. ?Chest x-ray personally reviewed, shows hyperexpansion, no focal  airspace disease ?Basic metabolic panel notable for creatinine normal, potassium up to 3.6 from 2.9 ?ABG shows 7.4, mild chronic hypercarbia, PCO2 51 ?CBC unremarkable ?COVID-negative ? ?Family Communication: Called to partner, no answer ?Called the son, left voicemail. ? ?Disposition: ?Status is: Observation ?The patient will require care spanning > 2 midnights and should be moved to inpatient because: He has severe COPD in the setting of advanced end-stage COPD, now flaring hypoxic progress symptoms and hypoxia at 75% on his home oxygen with ambulation to 50 ? ? ? Planned Discharge Destination: To be determined ? ? ? ? ?Author: ?Edwin Dada, MD ?11/05/2021 12:53 PM ? ?For on call review [email or web address].  ?

## 2021-11-05 NOTE — Progress Notes (Signed)
?  Transition of Care (TOC) Screening Note ? ? ?Patient Details  ?Name: Zachary Chambers ?Date of Birth: 27-Aug-1950 ? ? ?Transition of Care (TOC) CM/SW Contact:    ?Cyndi Bender, RN ?Phone Number: ?11/05/2021, 8:40 AM ? ? ? ?Transition of Care Department [facility]) has reviewed patient and no TOC needs have been identified at this time. We will continue to monitor patient advancement through interdisciplinary progression rounds. If new patient transition needs arise, please place a TOC consult. ? ? ?

## 2021-11-05 NOTE — Progress Notes (Signed)
Notified by CCMD at 0913 that patient's HR was in SVT sustaining in the 150s. This nurse entered room to find patient laying in the bed, asymptomatic. Secure chat sent to Dr. Loleta Books at [phone] and was paged at 0921, at bedside at [phone]. Patient HR continued to sustain in the 140-150 range. After having patient perform Valsalva maneuver, HR dropped into the 80-90 range. Dr. Loleta Books made aware, VS remain stable at this time.  ?

## 2021-11-06 DIAGNOSIS — J9611 Chronic respiratory failure with hypoxia: Secondary | ICD-10-CM | POA: Diagnosis not present

## 2021-11-06 DIAGNOSIS — I2510 Atherosclerotic heart disease of native coronary artery without angina pectoris: Secondary | ICD-10-CM | POA: Diagnosis not present

## 2021-11-06 DIAGNOSIS — J441 Chronic obstructive pulmonary disease with (acute) exacerbation: Secondary | ICD-10-CM | POA: Diagnosis not present

## 2021-11-06 DIAGNOSIS — R4189 Other symptoms and signs involving cognitive functions and awareness: Secondary | ICD-10-CM | POA: Diagnosis not present

## 2021-11-06 LAB — BASIC METABOLIC PANEL
Anion gap: 12 (ref 5–15)
BUN: 14 mg/dL (ref 8–23)
CO2: 26 mmol/L (ref 22–32)
Calcium: 8.9 mg/dL (ref 8.9–10.3)
Chloride: 95 mmol/L — ABNORMAL LOW (ref 98–111)
Creatinine, Ser: 1 mg/dL (ref 0.61–1.24)
GFR, Estimated: >60 mL/min (ref >60)
Glucose, Bld: 157 mg/dL — ABNORMAL HIGH (ref 70–99)
Potassium: 3.9 mmol/L (ref 3.5–5.1)
Sodium: 133 mmol/L — ABNORMAL LOW (ref 135–145)

## 2021-11-06 MED ORDER — ADULT MULTIVITAMIN W/MINERALS CH
1.0000 | ORAL_TABLET | Freq: Every day | ORAL | Status: DC
  Administered 2021-11-06 – 2021-11-08 (×3): 1 via ORAL
  Filled 2021-11-06 (×3): qty 1

## 2021-11-06 MED ORDER — LEVALBUTEROL HCL 0.63 MG/3ML IN NEBU
0.6300 mg | INHALATION_SOLUTION | Freq: Three times a day (TID) | RESPIRATORY_TRACT | Status: DC
  Administered 2021-11-06 – 2021-11-08 (×8): 0.63 mg via RESPIRATORY_TRACT
  Filled 2021-11-06 (×12): qty 3

## 2021-11-06 MED ORDER — ENSURE ENLIVE PO LIQD
237.0000 mL | Freq: Two times a day (BID) | ORAL | Status: DC
  Administered 2021-11-06 – 2021-11-08 (×5): 237 mL via ORAL

## 2021-11-06 NOTE — Progress Notes (Signed)
Upon patient transferring to bedside commode, oxygen saturations dropped to 84% on 2L of oxygen. Patient very dyspneic with minimal activity. Increasing to 4L until patient was able to recover, then back down to 2L. ?

## 2021-11-06 NOTE — Progress Notes (Signed)
Initial Nutrition Assessment ? ?DOCUMENTATION CODES:  ? ?Underweight ? ?INTERVENTION:  ? ?Liberalize pt diet to regular due to underweight status. ?Encourage good PO intake ?Meal ordering with assist ?Multivitamin w/ minerals daily ?Ensure Enlive po BID, each supplement provides 350 kcal and 20 grams of protein. ? ?NUTRITION DIAGNOSIS:  ? ?Increased nutrient needs related to chronic illness as evidenced by estimated needs. ? ?GOAL:  ? ?Patient will meet greater than or equal to 90% of their needs ? ?MONITOR:  ? ?PO intake, Supplement acceptance, Labs, Weight trends ? ?REASON FOR ASSESSMENT:  ? ?Consult ?Assessment of nutrition requirement/status ? ?ASSESSMENT:  ? ?71 y.o. male presented to the ED from PCP office. PMH includes COPD, GERD, malnutrition, HTN, and EtOH abuse. Pt admitted with COPD exacerbation.  ? ?Pt unavailable at time of RD visit. ? ?Per RN, unsure of how he well he ate breakfast. RN reports that pt is drinking pepsi. ?No meal intakes recorded within EMR. ? ?Per EMR, pt has had a 4% weight loss within 8 months.  ? ?Pt does have a history of malnutrition. Unable to perform NFPE at this time due to pt being with other provider.  ? ?Medications reviewed and include: Zithromax, Solu-Medrol, Protonix ?Labs reviewed: Sodium 133 ? ?NUTRITION - FOCUSED PHYSICAL EXAM: ? ?Deferred to follow-up.  ? ?Diet Order:   ?Diet Order   ? ?       ?  Diet Heart Room service appropriate? Yes; Fluid consistency: Thin  Diet effective now       ?  ? ?  ?  ? ?  ? ?EDUCATION NEEDS:  ? ?No education needs have been identified at this time ? ?Skin:  Skin Assessment: Reviewed RN Assessment ? ?Last BM:  3/21 ? ?Height:  ?Ht Readings from Last 1 Encounters:  ?11/04/21 '5\' 8"'$  (1.727 m)  ? ?Weight:  ?Wt Readings from Last 1 Encounters:  ?11/06/21 54.9 kg  ? ?Ideal Body Weight:  70 kg ? ?BMI:  Body mass index is 18.4 kg/m?. ? ?Estimated Nutritional Needs:  ? ?Kcal:  1700-1900 ? ?Protein:  85-100 grams ? ?Fluid:  >/= 1.7 L ? ? ? ?Hermina Barters RD, LDN ?Clinical Dietitian ?See AMi[facility] for contact information.  ? ?

## 2021-11-06 NOTE — Progress Notes (Signed)
?Progress Note ? ? ?Patient: Zachary Chambers DOB: 31-Oct-1950 DOA: 11/04/2021     1 ?DOS: the patient was seen and examined on 11/06/2021 ?  ? ? ? ? ?Brief hospital course: ?Mr. Bias is a 71 y.o. 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 ? ?Per PCP notes:  ?"Girlfriend states that the patient has become combative recently.  He stopped taking his medications.  He is fighting with them and they try to give him his medications including his breathing treatment.  He is also stopped eating.  Today on arrival he was satting 65% on room air." And fell getting out of the car. ? ?In the ER, CXR clear, required 4L O2, seemed confused.  Noted to have another episode of SVT. ? ? ?3/21: Admitted for COPD ?3/22: Desatted to 75% with ambulation just 10 feet, very winded, palliative consulted ?3/23: Still marked desaturations to 84% with ambulation short distance, few feet ? ? ? ? ? ? ? ?Assessment and Plan: ?* COPD exacerbation ([place]) ?Severe end stage COPD, now with rest symptoms and cough, and wheezing, and dyspnea with any exertion.   ? ?FEV1 43% 10 years ago, clearly worse now.  Notes indicate nonadherence to home inhalers. ? ?Still desatted to 85% with ambulation today ?- Continue Solumedrol ?- Continue azithromycin ?- Continue Mucinex, flutter ?- Schedule and PRN bronchodilators ?- Continue PPI ? ?- Consult Palliative Care ? ? ? ? ?Cognitive impairment ?Unclear if this is acute metabolic encephalopathy from hypoxia or dementia or both. ? ?Baseline seems to be one of advancing memory loss per PCP notes.  Here, he is quite disoriented, and I am uncertain how different he is from his baseline. ?- Treat underlying  COPD ? ?PSVT (paroxysmal supraventricular tachycardia) ([place]) ?S/p adenosine in EMS.  ?Here, this is brief , 2-10 minutes, asymptomatic and hemodynamically stable.  Not the primary issue. ? ?He carries a chart diagnosis of AF as well, and is on Eliquis ? ?No further SVT here on telemetry overnight ?- Keep K>4 and Mg >2 ?- Increase metoprolol ?-Continue home Eliquis ? ?Primary hypertension ?BP normal ?-Continue metop, Imdur ? ?Hypokalemia ?Supplemented and resolved ? ?PAF (paroxysmal atrial fibrillation) ([place]) ?-Continue metoprolol, Eliquis ? ?Chronic respiratory failure with hypoxia and hypercapnia (HCC) - 2 L/min at rest, 5 L/min with activity, baseline POC2 50 mmHg ?Impetigo ruled out  ? ?Protein-calorie malnutrition, severe ?- Consult dietitian ? ?Hyperlipidemia ?On lipitor. ? ?GERD (gastroesophageal reflux disease) ?-Continue PPI ? ?CAD (coronary artery disease) ?-Continue aspirin, Eliquis, lipitor, Imdur, metoprolol ? ?Impetigo-resolved as of 11/06/2021 ?Right ear. PCP wanted to treat with po keflex. ? ? ? ? ?  ? ?Subjective: Still wheezing, dyspneic with brief ambulation, no chest pain, fever ? ? ? ?Physical Exam: ?Vitals:  ? 11/06/21 0400 11/06/21 0746 11/06/21 0852 11/06/21 1134  ?BP: 134/86 116/74  (!) 149/90  ?Pulse: 71 81  65  ?Resp: '16 17  16  '$ ?Temp: 98.3 ?F (36.8 ?C) 97.7 ?F (36.5 ?C)  97.6 ?F (36.4 ?C)  ?TempSrc: Oral Oral  Oral  ?SpO2: 96% 95% 91% 92%  ?Weight: 54.9 kg     ?Height:      ? 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 ?He has mild cognitive impairment ? ? ? ? ?Data Reviewed: ?Nursing notes reviewed, vital signs reviewed. ?Labs and imaging notable for sodium 133, slightly down from  yesterday, clinically insignificant, potassium resolved ? ?Family Communication: Girlfriend by phone ? ?Disposition: ?Status is: Inpatient ?Patient requires ongoing care because of his ongoing hypoxia with minimal exertion, severe end-stage COPD with exacerbation ? ? ? ? ? Planned Discharge Destination: To be determined ? ? ? ? ?Author: ?Edwin Dada, MD ?11/06/2021 12:59 PM ? ?For on call review [email or web address].  ?

## 2021-11-06 NOTE — Plan of Care (Signed)

## 2021-11-07 ENCOUNTER — Ambulatory Visit: Payer: Medicare HMO | Admitting: Cardiovascular Disease

## 2021-11-07 DIAGNOSIS — E43 Unspecified severe protein-calorie malnutrition: Secondary | ICD-10-CM

## 2021-11-07 DIAGNOSIS — J9611 Chronic respiratory failure with hypoxia: Secondary | ICD-10-CM | POA: Diagnosis not present

## 2021-11-07 DIAGNOSIS — J441 Chronic obstructive pulmonary disease with (acute) exacerbation: Secondary | ICD-10-CM | POA: Diagnosis not present

## 2021-11-07 DIAGNOSIS — R4189 Other symptoms and signs involving cognitive functions and awareness: Secondary | ICD-10-CM | POA: Diagnosis not present

## 2021-11-07 DIAGNOSIS — I2510 Atherosclerotic heart disease of native coronary artery without angina pectoris: Secondary | ICD-10-CM | POA: Diagnosis not present

## 2021-11-07 MED ORDER — ORAL CARE MOUTH RINSE
15.0000 mL | Freq: Two times a day (BID) | OROMUCOSAL | Status: DC
  Administered 2021-11-07 – 2021-11-08 (×3): 15 mL via OROMUCOSAL

## 2021-11-07 NOTE — TOC Progression Note (Addendum)
Transition of Care (TOC) - Progression Note  ? ? ?Patient Details  ?Name: Zachary Chambers ?MRN: [id number] ?Date of Birth: 05-31-1951 ? ?Transition of Care (TOC) CM/SW Contact  ?Verdell Carmine, RN ?Phone Number: ?11/07/2021, 11:25 AM ? ?Clinical Narrative:    ? ?Discussed in rounds. Order placed for Outpatient palliative services. Will need oxygen increased at home, DME and home Health. Called Ms Zachary Chambers , [place] as that is who has been updated. Left confidential VM to return my call to discuss post hospital discharge planning ?60 Ms Zachary Chambers called back  discussed current DME, he has hospital bed, overbed table, wheelchair and oxygen at 2.5 liters. She is MPOA, his son isnt in the picture, She and patient have been together 34 years.  She is fine with home health, and palliative care OP  but is worried about co-pays. She is concerned about his eating. RN states the patient ate something this morning.  Called [employer] for concern of sinking mattress in hospital bed and what are the options. Freda Munro from [employer] states they will speak to patients SO.  Patient has oxygen with [employer], will likely be increased especially on ambulation.  ? ?Interviewed the patient also, he is pleasantly confused, wants to go home so he can mow the lawn.  Cachectic looking said he ate something this morning, but could not tell me what or how much. Encouraged PO intake, he may need supplementation at home. Consult with dietitian for recommendations, PT consult pending.  ? SO wants a [place] that will work with her insurance and call her about billing and any co-pays so she can make a decision.She just wants a agency that will work best with insurance. Reached out to [facility] with information, will likely need PT OT and aide. Adela Lank stated that they have zero co-pays.  ? [facility] notified of consult for OP palliative.  ?CM will continue to follow for needs, recommendations, and transitions ? ?1245 notified SO of patient eating and  that there is a zero co-pay for [facility]. Oxygen reordered according to qualifications via [employer].  ?Expected Discharge Plan: [place] ?Barriers to Discharge: Continued Medical Work up ? ?Expected Discharge Plan and Services ?Expected Discharge Plan: [place] ?  ?Discharge Planning Services: CM Consult ?  ?Living arrangements for the past 2 months: [place] ?                ?  ?  ?  ?  ?  ?  ?  ?  ?  ?  ? ? ?Social Determinants of Health (SDOH) Interventions ?  ? ?Readmission Risk Interventions ? ?  03/13/2021  ?  1:35 PM  ?Readmission Risk Prevention Plan  ?Transportation Screening Complete  ?Home Care Screening Complete  ?Medication Review (RN CM) Complete  ? ? ?

## 2021-11-07 NOTE — Progress Notes (Signed)
Patient ate 100% of his dinner and a cup of apple sauce.  Wife and grand son at bedside.  He is resting in bed.  Will continue to monitor. ?

## 2021-11-07 NOTE — Progress Notes (Signed)
[facility] ([facility]) Hospital Liaison Note  Notified by TOC manager of patient/family request for [facility] palliative services at home after discharge.   [facility] hospital liaison will follow patient for discharge disposition.   Please call with any hospice or outpatient palliative care related questions.   Thank you for the opportunity to participate in this patient's care.   Shanita Wicker, LCSW [facility] Hospital Liaison [phone]  

## 2021-11-07 NOTE — Progress Notes (Signed)
Brief Palliative Medicine Progress Note: ? ?PMT consult received and chart reviewed.  ? ?Noted consult was placed by [facility] for "Outpatient palliative services upon DC." Per TOC note, outpatient Palliative Care has been set up with [facility]. ? ?Spoke with Dr. Loleta Books - patient is likely discharging tomorrow and there are no inpatient PMT needs at this time. Will sign off.  ? ?Thank you for allowing PMT to assist in the care of this patient. Please re-consult if any inpatient needs arise. ? ?[employer] Tamala Julian, FNP-BC ?Palliative Medicine Team ?Team Phone: [phone] ?NO CHARGE ? ?

## 2021-11-07 NOTE — Progress Notes (Addendum)
Physical Therapy Evaluation Patient Details Name: Zachary Chambers MRN: [id number] DOB: 07-17-1951 Today's Date: 11/07/2021  History of Present Illness  71 yo male with onset of COPD exacerbation and mult falls before hosp was admitted on 3/21.  Pt had dyspnea, poor PO intake and is using O2 regularly at home.  Unclear how consistent pt is in using O2 given his confusion and difficulty with instructions on eval.  PMHx:  COPD, CAD, MI Sept 2022, a-fib, tachycardia, O2 use, HTN, PUD, GERD, DJD, chronic resp failure, rotator cuff injury  Clinical Impression  Pt is anticipating going home from hosp with assistance from his family and girlfriend.  He is fully unable to live alone, and will need to have 24/7 help for success.  Palliative care is expected to be involved but may not provide enough coverage.  Will recommend as a back up plan to go to SNF if family support is not possible, to have around the clock care and close medical monitoring of his symptoms of hypoxia.  Pt is unable to give a full history, and will require help due to cognition as he is presenting today.  Follow acutely for goals of PT and monitor for pt to be upgraded or downgraded on recommendations.      Recommendations for follow up therapy are one component of a multi-disciplinary discharge planning process, led by the attending physician.  Recommendations may be updated based on patient status, additional functional criteria and insurance authorization.  Follow Up Recommendations Home health PT or SNF if family cannot manage his 24/7 care    Assistance Recommended at Discharge Frequent or constant Supervision/Assistance  Patient can return home with the following  A little help with walking and/or transfers;A little help with bathing/dressing/bathroom;Assistance with cooking/housework;Direct supervision/assist for medications management;Direct supervision/assist for financial management;Assist for transportation;Help with stairs or  ramp for entrance    Equipment Recommendations Rolling walker (2 wheels)  Recommendations for Other Services       Functional Status Assessment Patient has had a recent decline in their functional status and demonstrates the ability to make significant improvements in function in a reasonable and predictable amount of time.     Precautions / Restrictions Precautions Precautions: Fall Precaution Comments: monitor O2 sats with all movement Restrictions Weight Bearing Restrictions: No      Mobility  Bed Mobility Overal bed mobility: Needs Assistance Bed Mobility: Supine to Sit, Sit to Supine     Supine to sit: Min guard, Min assist Sit to supine: Min guard, Min assist   General bed mobility comments: min assist to lift legs to bed and min assist for trunk off the bed    Transfers Overall transfer level: Needs assistance Equipment used: Rolling walker (2 wheels), 1 person hand held assist Transfers: Sit to/from Stand Sit to Stand: Min guard, Min assist           General transfer comment: variable but is mildly unsteady on his feet    Ambulation/Gait Ambulation/Gait assistance: Min assist Gait Distance (Feet): 5 Feet Assistive device: Rolling walker (2 wheels), 1 person hand held assist Gait Pattern/deviations: Step-to pattern, Decreased stride length, Wide base of support, Trunk flexed Gait velocity: reduced Gait velocity interpretation: <1.31 ft/sec, indicative of household ambulator   General Gait Details: pt did only sidestepping on side of bed, slow pace and struggling with SOB despite increase to 5L from 3L when PT arrived  [employer]  Modified Rankin (Stroke Patients Only)       Balance Overall balance assessment: Needs assistance, History of Falls Sitting-balance support: Feet supported Sitting balance-Leahy Scale: Fair     Standing balance support: Bilateral upper extremity supported, During functional  activity Standing balance-Leahy Scale: Poor Standing balance comment: O2 sats on 5L dropped to stand down to 90% but on 3L initially was 81%                             Pertinent Vitals/Pain Pain Assessment Pain Assessment: Faces Faces Pain Scale: No hurt    Home Living Family/patient expects to be discharged to:: Private residence Living Arrangements: Spouse/significant other Available Help at Discharge: Family;Available PRN/intermittently Type of Home: House Home Access: Stairs to enter;Ramped entrance Entrance Stairs-Rails: Right Entrance Stairs-Number of Steps: 5   Home Layout: Two level;Able to live on main level with bedroom/bathroom Home Equipment: Rolling Walker (2 wheels);Cane - single point (pt could not tell PT any other items)      Prior Function Prior Level of Function : Needs assist;History of Falls (last six months);Patient poor historian/Family not available  Cognitive Assist : Mobility (cognitive) Mobility (Cognitive): Step by step cues   Physical Assist : Mobility (physical) Mobility (physical): Bed mobility;Transfers;Gait   Mobility Comments: sequence cues       Hand Dominance   Dominant Hand: Right    Extremity/Trunk Assessment   Upper Extremity Assessment Upper Extremity Assessment: Generalized weakness    Lower Extremity Assessment Lower Extremity Assessment: Generalized weakness    Cervical / Trunk Assessment Cervical / Trunk Assessment: Kyphotic  Communication   Communication: No difficulties  Cognition Arousal/Alertness: Awake/alert Behavior During Therapy: Impulsive Overall Cognitive Status: No family/caregiver present to determine baseline cognitive functioning                                 General Comments: chart history reports some memory issues but is unclear how his current presentation compares        General Comments General comments (skin integrity, edema, etc.): pt was seen for mobility on RW  and noted his tolerance for gait was quite short before fatigue and SOB set in    Exercises     Assessment/Plan    PT Assessment Patient needs continued PT services  PT Problem List Cardiopulmonary status limiting activity;Decreased strength;Decreased activity tolerance;Decreased balance;Decreased mobility;Decreased coordination;Decreased knowledge of use of DME;Decreased safety awareness       PT Treatment Interventions DME instruction;Gait training;Functional mobility training;Therapeutic activities;Therapeutic exercise;Balance training;Neuromuscular re-education;Patient/family education    PT Goals (Current goals can be found in the Care Plan section)  Acute Rehab PT Goals Patient Stated Goal: to go home with wife PT Goal Formulation: With patient Time For Goal Achievement: 11/21/21 Potential to Achieve Goals: Good    Frequency Min 3X/week     Co-evaluation               AM-PAC PT "6 Clicks" Mobility  Outcome Measure Help needed turning from your back to your side while in a flat bed without using bedrails?: A Little Help needed moving from lying on your back to sitting on the side of a flat bed without using bedrails?: A Little Help needed moving to and from a bed to a chair (including a wheelchair)?: A Little Help needed standing up from a chair using your arms (e.g., wheelchair or bedside chair)?: A  Little Help needed to walk in hospital room?: A Little Help needed climbing 3-5 steps with a railing? : A Little 6 Click Score: 18    End of Session Equipment Utilized During Treatment: Oxygen;Gait belt Activity Tolerance: Patient limited by fatigue;Treatment limited secondary to medical complications (Comment) Patient left: in bed;with call bell/phone within reach;with bed alarm set;with nursing/sitter in room Nurse Communication: Mobility status PT Visit Diagnosis: Unsteadiness on feet (R26.81);Muscle weakness (generalized) (M62.81);Difficulty in walking, not  elsewhere classified (R26.2)    Time: 2725-3664 PT Time Calculation (min) (ACUTE ONLY): 41 min   Charges:   PT Evaluation $PT Eval Moderate Complexity: 1 Mod PT Treatments $Therapeutic Activity: 23-37 mins       Ivar Drape 11/07/2021, 2:30 PM  Samul Dada, PT PhD Acute Rehab Dept. Number: [facility] [id number] and [facility] [phone]

## 2021-11-07 NOTE — Progress Notes (Signed)
?  Progress Note ? ? ?Patient: Zachary Chambers:[id number] DOB: 1951-06-30 DOA: 11/04/2021     2 ?DOS: the patient was seen and examined on 11/07/2021 ?  ? ? ? ? ?Brief hospital course: ?Zachary Chambers is a 71 y.o. 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 ?  ? ?In the ER, CXR clear, required 4L O2, seemed confused.  Noted to have another episode of SVT. ? ?No improvement today with aggressive COPD therapy. ? ? ? ? ? ? ? ?Assessment and Plan: ?* COPD exacerbation ([place]) ?Still very symptomatic ?- Continue Solumedrol ?- Continue azithromycin ?- Continue Mucinex, flutter ?- Schedule and PRN bronchodilators ?  ? ? ? ?  ? ?PSVT (paroxysmal supraventricular tachycardia) ([place]) ?-Continue metoprolol  ? ?Primary hypertension ?BP normal ?-Continue metop, Imdur ? ?PAF (paroxysmal atrial fibrillation) ([place]) ?-Continue metoprolol, Eliquis ? ?Chronic respiratory failure with hypoxia and hypercapnia (HCC) - 2 L/min at rest, 5 L/min with activity, baseline POC2 50 mmHg ? ?CAD (coronary artery disease) ?-Continue aspirin, Eliquis, lipitor, Imdur, metoprolol ? ? ? ? ? ?  ? ?Subjective: Patient a little bit disoriented this morning, improved this afternoon, and no real change to wheezing, dyspnea, hypoxia, dyspnea with exertion ? ? ? ?Physical Exam: ?Vitals:  ? 11/07/21 0734 11/07/21 0753 11/07/21 1157 11/07/21 1421  ?BP:  137/65 123/74   ?Pulse:  63 (!) 59   ?Resp:  17    ?Temp:  98 ?F (36.7 ?C) 97.8 ?F (36.6 ?C)   ?TempSrc:  Oral Oral   ?SpO2: 95% 97%  96%  ?Weight:      ?Height:      ? 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 ?He has mild cognitive impairment ? ? ? ? ?Data Reviewed: ?Nursing notes reviewed, vital signs reviewed. ?No new labs to review ?Discussed with palliative care, goals of care reviewed with son and partner ? ?Family Communication: Girlfriend by phone, son by phone ? ?Disposition: ?Status is: Inpatient ?Patient requires ongoing care because of his ongoing hypoxia with minimal exertion, severe end-stage COPD with exacerbation ? ? ? ? ? Planned Discharge Destination: To be determined ? ? ? ? ?Author: ?Zachary Dada, MD ?11/07/2021 4:08 PM ? ?For on call review [email or web address].  ?

## 2021-11-08 ENCOUNTER — Telehealth: Payer: Self-pay | Admitting: Emergency Medicine

## 2021-11-08 DIAGNOSIS — J441 Chronic obstructive pulmonary disease with (acute) exacerbation: Secondary | ICD-10-CM | POA: Diagnosis not present

## 2021-11-08 DIAGNOSIS — I2510 Atherosclerotic heart disease of native coronary artery without angina pectoris: Secondary | ICD-10-CM | POA: Diagnosis not present

## 2021-11-08 DIAGNOSIS — K219 Gastro-esophageal reflux disease without esophagitis: Secondary | ICD-10-CM | POA: Diagnosis not present

## 2021-11-08 MED ORDER — PREDNISONE 10 MG PO TABS: ORAL_TABLET | ORAL | 0 refills | Status: DC

## 2021-11-08 MED ORDER — GUAIFENESIN ER 600 MG PO TB12: 600.0000 mg | ORAL_TABLET | Freq: Two times a day (BID) | ORAL | Status: DC

## 2021-11-08 MED ORDER — BENZONATATE 100 MG PO CAPS: 100.0000 mg | ORAL_CAPSULE | Freq: Three times a day (TID) | ORAL | 0 refills | Status: DC | PRN

## 2021-11-08 MED ORDER — METOPROLOL TARTRATE 50 MG PO TABS: 50.0000 mg | ORAL_TABLET | Freq: Two times a day (BID) | ORAL | 3 refills | Status: DC

## 2021-11-08 MED ORDER — ALBUTEROL SULFATE (2.5 MG/3ML) 0.083% IN NEBU: 2.5000 mg | INHALATION_SOLUTION | RESPIRATORY_TRACT | 4 refills | Status: DC | PRN

## 2021-11-08 NOTE — Progress Notes (Signed)
Patient ambulated in the hallway.  Ambulatory saturation on 2L O2 was 92%, ambulated approximately 100 ft with front wheel walker.  Desaturated to 86% as patient was rushing to get to the bathroom.  Increased O2 to 4L, saturation went back up to 94%, with SOB.  Assisted back to bed, O2 decreased back down to 2L, saturation 94%.  Activity tolerated well. ?

## 2021-11-08 NOTE — Discharge Summary (Signed)
?Physician Discharge Summary ?  ?Patient: Zachary Chambers MRN: [id number] DOB: 1950/08/25  ?Admit date:     11/04/2021  ?Discharge date: 11/08/21  ?Discharge Physician: Edwin Dada  ? ?PCP: [name], MD  ? ?Recommendations at discharge:  ?Follow up with Dr. Dennard Schaumann in 1 week ?[facility] to follow at home; may progress to need Hospice services sooner rather than later ?Follow up with Dr. Lamonte Sakai in 2-3 weeks, if physically able and not transitioning to Hospice sooner ? ? ? ? ? ?Discharge Diagnoses: ?Principal Problem: ?  COPD exacerbation ([place]) ?Active Problems: ?  CAD (coronary artery disease) ?  GERD (gastroesophageal reflux disease) ?  Protein-calorie malnutrition, severe ?  Chronic respiratory failure with hypoxia and hypercapnia (HCC) - 2 L/min at rest, 5 L/min with activity, baseline POC2 50 mmHg ?  PAF (paroxysmal atrial fibrillation) ([place]) ?  Hypokalemia ?  Primary hypertension ?  PSVT (paroxysmal supraventricular tachycardia) ([place]) ?  Cognitive impairment ?  COPD with acute exacerbation ([place]) ?  ? ? ? ? ? ?Hospital Course: ?Mr. Gentile is a 71 y.o. 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 ? ?Has been argumentative, refusing medications, losing weight and increasingly confused lately.  The day of admission he went to PCP's office and was satting 65% on room air and fell getting out of the car.   ? ?In the ER, CXR clear, required 4L O2, seemed confused, whezing, poor air movement.  Noted to have another episode of SVT. ? ? ? ? ? ? ?Assessment and Plan: ?* COPD exacerbation ([place]) ?Admitted and started on Solu-Medrol 40 twice daily, azithromycin, Mucinex, flutter valve, scheduled  bronchodilators, and PPI.   ? ?The patient's desaturations with ambulation got slightly better with treatment but the patient noted no clinical improvement over 5 days treatment. ? ?He remained exceedingly weak from a high likelihood of fall, and likely to desaturate with exertion.  Given failure to respond to aggressive therapy, I suspect he is just at end stage now (was FEV1 43% 9 years ago). ? ?I discussed Hospice with family, patient has hope for some remaining good quality of life.  I arranged Palliative Care to follow at D/c with low threshold to transition to Hospice, as patient has high symptom burden and extremely limited functional status.  ? ? ? ? ?Cognitive impairment ?In discussion with girlfriend, it seems that he frequently has some O2 saturation 70% at home, has for some months.  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  Acute encephalopathy ruled out.    ? ? ?PSVT (paroxysmal supraventricular tachycardia) ([place]) ?S/p adenosine in EMS.   He was monitored on telemetry here for 48 hours, had only 2 brief episodes, metoprolol increased.    ? ? ? ? ? ?  ? ?Pain control - [employer] Controlled Substance Reporting System database was reviewed.   ? ? ?Diet recommendation:  ?Discharge Diet Orders (From admission, onward)  ? ?  Start     Ordered  ? 11/08/21 0000  Diet - low sodium heart healthy       ? 11/08/21 1553  ? ?  ?  ? ?  ? ? ?  DISCHARGE MEDICATION: ?Allergies as of 11/08/2021   ?No Known Allergies ?  ? ?  ?Medication List  ?  ? ?STOP taking these medications   ? ?folic acid 1 MG tablet ?Commonly known as: FOLVITE ?  ?potassium chloride SA 20 MEQ tablet ?Commonly known as: KLOR-CON M ?  ?SSD 1 % cream ?Generic drug: silver sulfADIAZINE ?  ?thiamine 100 MG tablet ?  ?traMADol 50 MG tablet ?Commonly known as: ULTRAM ?  ?zinc gluconate 50 MG tablet ?  ? ?  ? ?TAKE these medications   ? ?albuterol 108 (90 Base) MCG/ACT inhaler ?Commonly known as:  VENTOLIN HFA ?Inhale 2 puffs into the lungs every 6 (six) hours as needed for wheezing or shortness of breath. ?  ?albuterol (2.5 MG/3ML) 0.083% nebulizer solution ?Commonly known as: PROVENTIL ?Take 3 mLs (2.5 mg total) by nebulization every 4 (four) hours as needed for wheezing or shortness of breath. ?  ?apixaban 5 MG Tabs tablet ?Commonly known as: Eliquis ?Take 1 tablet (5 mg total) by mouth 2 (two) times daily. ?  ?aspirin 81 MG tablet ?Take 81 mg by mouth at bedtime. ?  ?atorvastatin 40 MG tablet ?Commonly known as: LIPITOR ?Take 1 tablet (40 mg total) by mouth at bedtime. ?  ?benzonatate 100 MG capsule ?Commonly known as: Best boy ?Take 1 capsule (100 mg total) by mouth 3 (three) times daily as needed for cough. ?  ?Breztri Aerosphere 160-9-4.8 MCG/ACT Aero ?Generic drug: Budeson-Glycopyrrol-Formoterol ?Inhale 2 puffs into the lungs 2 (two) times daily. ?  ?D3 PO ?Take 1 tablet by mouth daily. ?  ?fluticasone 50 MCG/ACT nasal spray ?Commonly known as: FLONASE ?Place 2 sprays into both nostrils daily. ?  ?guaiFENesin 600 MG 12 hr tablet ?Commonly known as: [place] ?Take 1 tablet (600 mg total) by mouth 2 (two) times daily. ?  ?isosorbide mononitrate 30 MG 24 hr tablet ?Commonly known as: IMDUR ?Take 0.5 tablets (15 mg total) by mouth daily. ?  ?LORazepam 0.5 MG tablet ?Commonly known as: ATIVAN ?Take 1 tablet (0.5 mg total) by mouth 2 (two) times daily as needed for anxiety. ?  ?magnesium oxide 400 (240 Mg) MG tablet ?Commonly known as: MAG-OX ?Take 1 tablet (400 mg total) by mouth daily. ?  ?metoprolol tartrate 50 MG tablet ?Commonly known as: LOPRESSOR ?Take 1 tablet (50 mg total) by mouth 2 (two) times daily. ?What changed:  ?medication strength ?how much to take ?  ?multivitamin with minerals Tabs tablet ?Take 1 tablet by mouth daily. ?  ?nitroGLYCERIN 0.4 MG SL tablet ?Commonly known as: NITROSTAT ?DISSOLVE 1 TABLET UNDER THE TONGUE EVERY 5 MINUTES AS NEEDED FOR CHEST PAIN ?  ?omeprazole 40 MG  capsule ?Commonly known as: PRILOSEC ?Take 1 capsule (40 mg total) by mouth daily. ?  ?predniSONE 10 MG tablet ?Commonly known as: DELTASONE ?Take 40 mg (4 tabs) for 3 days then 30 mg (3 tabs) for 3 days then 20 mg (2 tabs) for 3 days then 10 mg (1 tab) for 3 days then stop ?What changed:  ?medication strength ?additional instructions ?  ?QUEtiapine 25 MG tablet ?Commonly known as: SEROQUEL ?Take 1 tablet (25 mg total) by mouth at bedtime. ?  ?ReliOn True Metrix Test Strips test strip ?Generic drug: glucose blood ?USE AS DIRECTED. To monitor fsbs 2 times per day dx e11.9 ?  ?True Metrix Air Glucose Meter Devi ?USE AS DIRECTED. To monitor fsbs 2 times per day dx e11.9 ?  ?True Metrix Level 1 Low Soln ?USE AS DIRECTED.  To monitor fsbs 2 times per day dx e11.9 ?  ?VITAMIN B-12 PO ?Take 1 tablet by mouth daily. ?  ? ?  ? ?  ?  ? ? ?  ?Durable Medical Equipment  ?(From admission, onward)  ?  ? ? ?  ? ?  Start     Ordered  ? 11/08/21 1543  DME Oxygen  Once       ?Comments: Use 2L at rest, use 4-6L with ambulation  ?Question Answer Comment  ?Length of Need Lifetime   ?Mode or (Route) Nasal cannula   ?Liters per Minute 2   ?Frequency Continuous (stationary and portable oxygen unit needed)   ?Oxygen delivery system Gas   ?  ? 11/08/21 1553  ? 11/07/21 1244  For home use only DME oxygen  Once       ?Comments: 2.5 L at rest 4 L on ambulation  ?Question Answer Comment  ?Length of Need Lifetime   ?Mode or (Route) Nasal cannula   ?Liters per Minute 2   ?Frequency Continuous (stationary and portable oxygen unit needed)   ?Oxygen conserving device Yes   ?Oxygen delivery system Gas   ?  ? 11/07/21 1245  ? ?  ?  ? ?  ? ? Follow-up Information   ? ? [facility] Follow up.   ?Why: Your home health company ?Contact information: ?[place] ?[place] ?[phone] ? ?  ?  ? ? [facility] Follow up.   ?Why: Your medical equipment company and oxygen company ?Contact  information: ?[address] ?[place] [address] ?[phone] ? ? ?  ?  ? ? [facility] Follow up.   ?Why: Palliative care referral they will call you to schedule ?Contact information: ?[place] ?[place]

## 2021-11-08 NOTE — Telephone Encounter (Signed)
Need to get him in to see RB or APP in the next week please, hospital follow-up. Thanks.  ?

## 2021-11-08 NOTE — TOC Transition Note (Signed)
Transition of Care (TOC) - CM/SW Discharge Note ? ? ?Patient Details  ?Name: Zachary Chambers ?MRN: [id number] ?Date of Birth: 07-15-1951 ? ?Transition of Care (TOC) CM/SW Contact:  ?Carles Collet, RN ?Phone Number: ?11/08/2021, 3:18 PM ? ? ?Clinical Narrative:    ?Notified [facility] and [facility] of DC. ?Confirmed with Lelon Frohlich, significant other that he has oxygen, hospital bed, WC RW 3/1 at home, and does not have any additional DME needs. Lelon Frohlich is to provide transportation home from the hospital. No other TOC needs identified.  ? ? ? ?Final next level of care: [place] ?Barriers to Discharge: No Barriers Identified ? ? ?Patient Goals and CMS Choice ?Patient states their goals for this hospitalization and ongoing recovery are:: return home ?CMS Medicare.gov Compare Post Acute Care list provided to:: Patient ?Choice offered to / list presented to : Patient ? ?Discharge Placement ?  ?           ?  ?  ?  ?  ? ?Discharge Plan and Services ?  ?Discharge Planning Services: CM Consult ?           ?DME Arranged: Oxygen ?DME Agency: [facility] ?Date DME Agency Contacted: 11/07/21 ?Time DME Agency Contacted: [phone] ?Representative spoke with at DME Agency: Freda Munro, updated liter flow, has at home with [facility] already ?HH Arranged: PT, OT, Nurse's Aide ?[place] Agency: [facility] ?Date HH Agency Contacted: 11/08/21 ?Time [place]: 4627 ?Representative spoke with at [place]: Tommi Rumps ? ?Social Determinants of Health (SDOH) Interventions ?  ? ? ?Readmission Risk Interventions ? ?  03/13/2021  ?  1:35 PM  ?Readmission Risk Prevention Plan  ?Transportation Screening Complete  ?Home Care Screening Complete  ?Medication Review (RN CM) Complete  ? ? ? ? ? ?

## 2021-11-08 NOTE — Progress Notes (Signed)
Discharge instructions given to patient.  Education emphasized on compliance on medications use and keeping up with doctors' appointments.  Patient verbalized understanding.  Patient is waiting for Webb Silversmith, his significant other, to bring him clothes. ?

## 2021-11-10 ENCOUNTER — Telehealth: Payer: Self-pay

## 2021-11-10 ENCOUNTER — Other Ambulatory Visit: Payer: Self-pay | Admitting: Family Medicine

## 2021-11-10 DIAGNOSIS — R051 Acute cough: Secondary | ICD-10-CM

## 2021-11-10 MED ORDER — BENZONATATE 100 MG PO CAPS: 100.0000 mg | ORAL_CAPSULE | Freq: Three times a day (TID) | ORAL | 0 refills | Status: DC | PRN

## 2021-11-10 NOTE — Telephone Encounter (Signed)
ATC patient-unable to leave vm due to mailbox being full.  Will call back.  

## 2021-11-10 NOTE — Telephone Encounter (Signed)
Transition Care Management Follow-up Telephone Call ?Date of discharge and from where: 11/08/21 Zachary Chambers ?How have you been since you were released from the hospital? Spoke with pt's significant other, Zachary Chambers and she states pt is doing well. Ambulating, eating and drinking without issues. ?Any questions or concerns? No ? ?Items Reviewed: ?Did the pt receive and understand the discharge instructions provided? Yes  ?Medications obtained and verified? Yes  ?Other? No  ?Any new allergies since your discharge? No  ?Dietary orders reviewed? Yes ?Do you have support at home? Yes  ? ?Home Care and Equipment/Supplies: ?Were home health services ordered? yes ?If so, what is the name of the agency? [facility]  ?Has the agency set up a time to come to the patient's home? no ?Were any new equipment or medical supplies ordered?  Yes: oxygen ?What is the name of the medical supply agency? [facility] ?Were you able to get the supplies/equipment? yes ?Do you have any questions related to the use of the equipment or supplies? No ? ?Functional Questionnaire: (I = Independent and D = Dependent) ?ADLs: I ? ?Bathing/Dressing- I ? ?Meal Prep- I ? ?Eating- I ? ?Maintaining continence- I ? ?Transferring/Ambulation- I ? ?Managing Meds- I ? ?Follow up appointments reviewed: ? ?PCP Hospital f/u appt confirmed? Yes  Scheduled to see Dr. Dennard Schaumann on 11/20/21 @ 4. ?[facility] f/u appt confirmed? Yes  Scheduled to see Dr. Lamonte Sakai in 4 weeks ?Are transportation arrangements needed? No  ?If their condition worsens, is the pt aware to call PCP or go to the Emergency Dept.? Yes ?Was the patient provided with contact information for the PCP's office or ED? Yes ?Was to pt encouraged to call back with questions or concerns? Yes ? ?

## 2021-11-10 NOTE — Care Management Important Message (Signed)
Important Message ? ?Patient Details  ?Name: Zachary Chambers ?MRN: [id number] ?Date of Birth: [birth year] ? ? ?Medicare Important Message Given:  Yes ?Patient left prior to IM delivery IM will be mail to the patient home address.  ? ? ? ?[name] ?11/10/2021, 3:54 PM ?
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
## 2021-11-11 ENCOUNTER — Telehealth: Payer: Self-pay

## 2021-11-11 NOTE — Telephone Encounter (Signed)
Spoke with patient's spouse Zachary Chambers and scheduled a telephonic per request Palliative Consult for 11/21/21 @ 2:30 PM.  ? ?Consent obtained; updated Outlook/Netsmart/Team List and Epic.  ? ?

## 2021-11-14 NOTE — Telephone Encounter (Signed)
ATC patient. Voicemail was full so couldn't leave a VM. Will attempt to call patient again.  ?

## 2021-11-20 ENCOUNTER — Ambulatory Visit (INDEPENDENT_AMBULATORY_CARE_PROVIDER_SITE_OTHER): Payer: Medicare HMO | Admitting: Family Medicine

## 2021-11-20 VITALS — BP 118/58 | HR 66 | Temp 97.5°F | Ht 68.0 in

## 2021-11-20 DIAGNOSIS — J441 Chronic obstructive pulmonary disease with (acute) exacerbation: Secondary | ICD-10-CM

## 2021-11-20 DIAGNOSIS — R051 Acute cough: Secondary | ICD-10-CM | POA: Diagnosis not present

## 2021-11-20 DIAGNOSIS — J449 Chronic obstructive pulmonary disease, unspecified: Secondary | ICD-10-CM | POA: Diagnosis not present

## 2021-11-20 MED ORDER — BENZONATATE 100 MG PO CAPS: 100.0000 mg | ORAL_CAPSULE | Freq: Three times a day (TID) | ORAL | 11 refills | Status: DC | PRN

## 2021-11-20 MED ORDER — PREDNISONE 5 MG PO TABS: 10.0000 mg | ORAL_TABLET | Freq: Every day | ORAL | 1 refills | Status: DC

## 2021-11-20 NOTE — Progress Notes (Signed)
? ?Subjective:  ? ? Patient ID: Zachary Chambers, male    DOB: 17-Jun-1951, 71 y.o.   MRN: [id number] ? ?HPI ?Admit date:     11/04/2021  ?Discharge date: 11/08/21  ?Discharge Physician: Edwin Dada  ?  ?PCP: [name], MD  ?  ?Recommendations at discharge:  ?Follow up with Dr. Dennard Schaumann in 1 week ?[facility] to follow at home; may progress to need Hospice services sooner rather than later ?Follow up with Dr. Lamonte Sakai in 2-3 weeks, if physically able and not transitioning to Hospice sooner ?  ?  ?  ?  ?  ?Discharge Diagnoses: ?Principal Problem: ?  COPD exacerbation ([place]) ?Active Problems: ?  CAD (coronary artery disease) ?  GERD (gastroesophageal reflux disease) ?  Protein-calorie malnutrition, severe ?  Chronic respiratory failure with hypoxia and hypercapnia (HCC) - 2 L/min at rest, 5 L/min with activity, baseline POC2 50 mmHg ?  PAF (paroxysmal atrial fibrillation) ([place]) ?  Hypokalemia ?  Primary hypertension ?  PSVT (paroxysmal supraventricular tachycardia) ([place]) ?  Cognitive impairment ?  COPD with acute exacerbation ([place]) ?  ?  ?  ?  ?  ?  ?Hospital Course: ?Mr. Zachary Chambers is a 71 y.o. 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 ?  ?Has been argumentative, refusing medications, losing weight and increasingly confused lately.  The day of admission he went to PCP's office and was satting 65% on room air and fell getting out of the car.   ?  ?In the ER, CXR clear, required 4L O2, seemed confused, whezing, poor air movement.  Noted to have another episode of SVT. ?  ?  ?  ?  ?  ?  ?Assessment and Plan: ?* COPD exacerbation (Zachary Chambers) ?Admitted and started on Solu-Medrol 40 twice daily, azithromycin,  Mucinex, flutter valve, scheduled bronchodilators, and PPI.   ?  ?The patient's desaturations with ambulation got slightly better with treatment but the patient noted no clinical improvement over 5 days treatment. ? ?He remained exceedingly weak from a high likelihood of fall, and likely to desaturate with exertion.  Given failure to respond to aggressive therapy, I suspect he is just at end stage now (was FEV1 43% 9 years ago). ?  ?I discussed Hospice with family, patient has hope for some remaining good quality of life.  I arranged Palliative Care to follow at D/c with low threshold to transition to Hospice, as patient has high symptom burden and extremely limited functional status.  ?   ?Cognitive impairment ?In discussion with girlfriend, it seems that he frequently has some O2 saturation 70% at home, has for some months.  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  Acute encephalopathy ruled out.    ?  ?  ?PSVT (paroxysmal supraventricular tachycardia) (Zachary Chambers) ?S/p adenosine in EMS.   He was monitored on telemetry here for 48 hours, had only 2 brief episodes, metoprolol increased.    ? ?11/20/21 ?Here today for follow up from hospital.  Initially I was prepared to have a discussion regarding hospice.  However the patient has rallied and looks much better since coming home from the hospital.  He and his girlfriend  have gotten married after he was discharged from the hospital.  They are very proud.  They also recently purchased an RV that they took on a road trip.  He appears stronger today and more motivated to fight to live.  He is using Breztri 2 puffs twice daily and albuterol through the nebulizer 4 times daily.  He is about to discontinue prednisone however his wife states that he is done remarkably better following the prolonged prednisone taper.  They question if he could remain on prednisone long-term.  They realize that his life expectancy is short based on  his end-stage COPD and the prednisone truly adds to his quality of life. ?  ?  ?Past Medical History:  ?Diagnosis Date  ? ACS (acute coronary syndrome) (Zachary Chambers) 04/19/2021  ? Alcohol abuse   ? Aortic atherosclerosis (Zachary Chambers)   ? CAD in native artery   ? Chronic respiratory failure (Zachary Chambers)   ? COPD (chronic obstructive pulmonary disease) (Zachary Chambers)   ? DJD (degenerative joint disease)   ? diffusely  ? GERD (gastroesophageal reflux disease)   ? Hyperlipidemia   ? On home O2   ? Paroxysmal atrial fibrillation (HCC)   ? Pericardial effusion   ? Pneumonia due to COVID-19 virus   ? PUD (peptic ulcer disease)   ? with bleeding  ? Rotator cuff disorder   ? has been evaluated by Dr Clifton James and [facility]  ? Stroke (cerebrum) (Zachary Chambers)   ? ? ?Past Surgical History:  ?Procedure Laterality Date  ? COLONOSCOPY    ? EYE SURGERY    ? right eye removed at age 26  ? heart stent    ? x3- 2006  ? ?Current Outpatient Medications on File Prior to Visit  ?Medication Sig Dispense Refill  ? albuterol (PROVENTIL) (2.5 MG/3ML) 0.083% nebulizer solution Take 3 mLs (2.5 mg total) by nebulization every 4 (four) hours as needed for wheezing or shortness of breath. 180 mL 4  ? albuterol (VENTOLIN HFA) 108 (90 Base) MCG/ACT inhaler Inhale 2 puffs into the lungs every 6 (six) hours as needed for wheezing or shortness of breath. 18 g 5  ? apixaban (ELIQUIS) 5 MG TABS tablet Take 1 tablet (5 mg total) by mouth 2 (two) times daily. 180 tablet 3  ? aspirin 81 MG tablet Take 81 mg by mouth at bedtime.    ? atorvastatin (LIPITOR) 40 MG tablet Take 1 tablet (40 mg total) by mouth at bedtime. 90 tablet 3  ? benzonatate (TESSALON PERLES) 100 MG capsule Take 1 capsule (100 mg total) by mouth 3 (three) times daily as needed for cough. 60 capsule 0  ? Blood Glucose Calibration (TRUE METRIX LEVEL 1) Low SOLN USE AS DIRECTED. To monitor fsbs 2 times per day dx e11.9 1 each 3  ? Blood Glucose Monitoring Suppl (TRUE METRIX AIR GLUCOSE METER) DEVI USE AS DIRECTED. To monitor fsbs 2  times per day dx e11.9 1 each 3  ? Budeson-Glycopyrrol-Formoterol (BREZTRI AEROSPHERE) 160-9-4.8 MCG/ACT AERO Inhale 2 puffs into the lungs 2 (two) times daily. 17.7 g 3  ? Cholecalciferol (D3 PO) Take 1 tablet by mouth daily.    ? Cyanocobalamin (VITAMIN B-12 PO) Take 1 tablet by mouth daily.    ? fluticasone (FLONASE) 50 MCG/ACT nasal spray Place 2 sprays into both nostrils daily. 48 g 3  ? glucose blood (RELION TRUE METRIX TEST STRIPS) test strip USE AS DIRECTED. To monitor fsbs 2 times per day dx e11.9 100 each 3  ? guaiFENesin ([place])  600 MG 12 hr tablet Take 1 tablet (600 mg total) by mouth 2 (two) times daily.    ? isosorbide mononitrate (IMDUR) 30 MG 24 hr tablet Take 0.5 tablets (15 mg total) by mouth daily. 45 tablet 3  ? LORazepam (ATIVAN) 0.5 MG tablet Take 1 tablet (0.5 mg total) by mouth 2 (two) times daily as needed for anxiety. 180 tablet 0  ? magnesium oxide (MAG-OX) 400 (240 Mg) MG tablet Take 1 tablet (400 mg total) by mouth daily. 100 tablet 0  ? metoprolol tartrate (LOPRESSOR) 50 MG tablet Take 1 tablet (50 mg total) by mouth 2 (two) times daily. 60 tablet 3  ? Multiple Vitamin (MULTIVITAMIN WITH MINERALS) TABS tablet Take 1 tablet by mouth daily. 90 tablet 0  ? nitroGLYCERIN (NITROSTAT) 0.4 MG SL tablet DISSOLVE 1 TABLET UNDER THE TONGUE EVERY 5 MINUTES AS NEEDED FOR CHEST PAIN 25 tablet 0  ? omeprazole (PRILOSEC) 40 MG capsule Take 1 capsule (40 mg total) by mouth daily. 90 capsule 3  ? predniSONE (DELTASONE) 10 MG tablet Take 40 mg (4 tabs) for 3 days then 30 mg (3 tabs) for 3 days then 20 mg (2 tabs) for 3 days then 10 mg (1 tab) for 3 days then stop 30 tablet 0  ? QUEtiapine (SEROQUEL) 25 MG tablet Take 1 tablet (25 mg total) by mouth at bedtime. 90 tablet 3  ? ?No current facility-administered medications on file prior to visit.  ? ?No Known Allergies ?Social History  ? ?Socioeconomic History  ? Marital status: Single  ?  Spouse name: Not on file  ? Number of children: Not on file  ?  Years of education: Not on file  ? Highest education level: Not on file  ?Occupational History  ? Occupation: retired  ?Tobacco Use  ? Smoking status: Former  ?  Packs/day: 1.00  ?  Years: 40.00  ?  Pack years:

## 2021-11-21 ENCOUNTER — Other Ambulatory Visit: Payer: Self-pay | Admitting: Family Medicine

## 2021-11-21 ENCOUNTER — Telehealth: Payer: Self-pay | Admitting: Family Medicine
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
## 2021-11-25 ENCOUNTER — Telehealth: Payer: Self-pay | Admitting: Family Medicine

## 2021-11-25 MED ORDER — ATORVASTATIN CALCIUM 40 MG PO TABS: 40.0000 mg | ORAL_TABLET | Freq: Every day | ORAL | 3 refills | Status: DC

## 2021-11-25 NOTE — Telephone Encounter (Signed)
Received eFax from pharmacy to request refill of ? ?atorvastatin (LIPITOR) 40 MG tablet ? ? ?Efax received from ? ?[address]  ?[place], [place] [address]  ?Phone:  [phone]  Fax:  [phone]  ? ?Please advise pharmacist.  ?

## 2021-11-25 NOTE — Telephone Encounter (Signed)
Med sent to pharmacy.

## 2021-11-28 ENCOUNTER — Encounter: Payer: Self-pay | Admitting: Family Medicine

## 2021-11-28 NOTE — Progress Notes (Signed)
Visit was cancelled due to NP getting pt's significant other, patient was not available and significant other requesting reschedule to speak with pt.  She wanted time to educate pt on call and discuss before NP called back so phone note created.  Encounter left open in error. ?Damaris Hippo FNP-C ?

## 2021-12-04 ENCOUNTER — Other Ambulatory Visit: Payer: Self-pay | Admitting: Family Medicine

## 2021-12-04 ENCOUNTER — Telehealth: Payer: Self-pay

## 2021-12-04 NOTE — Telephone Encounter (Signed)
Attempted to contact patient's SO [name] to reschedule a Palliative Care consult appointment. No answer and unable to leave a message voicemail is full.  ? ?

## 2021-12-08 ENCOUNTER — Other Ambulatory Visit: Payer: Self-pay | Admitting: Family Medicine

## 2021-12-09 NOTE — Telephone Encounter (Signed)
LOV 11/20/21 ?Last refill 05/22/21, #180, 0 refills ? ?Please review, thanks! ? ?

## 2021-12-10 ENCOUNTER — Other Ambulatory Visit: Payer: Self-pay

## 2021-12-10 ENCOUNTER — Telehealth: Payer: Self-pay

## 2021-12-10 MED ORDER — PREDNISONE 5 MG PO TABS: 10.0000 mg | ORAL_TABLET | Freq: Every day | ORAL | 1 refills | Status: DC

## 2021-12-10 NOTE — Telephone Encounter (Signed)
Pharmacy faxed a refill request for: ? ?predniSONE (DELTASONE) 5 MG tablet [[id number]]  ?  Order Details ?Dose: 10 mg Route: Oral Frequency: Daily with breakfast  ?Dispense Quantity: 60 tablet Refills: 1   ?     ?Sig: Take 2 tablets (10 mg total) by mouth daily with breakfast.  ?     ?Start Date: 11/20/21 End Date: --  ?Written Date: 11/20/21 Expiration Date: 11/20/22  ? ? ?

## 2021-12-15 ENCOUNTER — Telehealth: Payer: Self-pay

## 2021-12-15 NOTE — Telephone Encounter (Signed)
Attempted to contact patient's SO [name] to reschedule a Palliative Care consult appointment. No answer left a message to return call.  ?

## 2021-12-19 ENCOUNTER — Ambulatory Visit: Payer: Medicare HMO | Admitting: Family Medicine

## 2021-12-26 ENCOUNTER — Ambulatory Visit (INDEPENDENT_AMBULATORY_CARE_PROVIDER_SITE_OTHER): Payer: Medicare HMO | Admitting: Family Medicine

## 2021-12-26 VITALS — BP 110/62 | HR 75 | Temp 97.4°F | Ht 68.0 in | Wt 122.0 lb

## 2021-12-26 DIAGNOSIS — I6930 Unspecified sequelae of cerebral infarction: Secondary | ICD-10-CM | POA: Diagnosis not present

## 2021-12-26 DIAGNOSIS — J449 Chronic obstructive pulmonary disease, unspecified: Secondary | ICD-10-CM | POA: Diagnosis not present

## 2021-12-26 DIAGNOSIS — I25110 Atherosclerotic heart disease of native coronary artery with unstable angina pectoris: Secondary | ICD-10-CM | POA: Diagnosis not present

## 2021-12-26 MED ORDER — CLOTRIMAZOLE-BETAMETHASONE 1-0.05 % EX CREA: 1.0000 "application " | TOPICAL_CREAM | Freq: Two times a day (BID) | CUTANEOUS | 1 refills | Status: DC

## 2021-12-26 MED ORDER — PREDNISONE 5 MG PO TABS: 5.0000 mg | ORAL_TABLET | Freq: Every day | ORAL | 3 refills | Status: DC

## 2021-12-26 NOTE — Progress Notes (Signed)
? ?Subjective:  ? ? Patient ID: Zachary Chambers, male    DOB: February 01, 1951, 71 y.o.   MRN: [id number] ? ?HPI ?Admit date:     11/04/2021  ?Discharge date: 11/08/21  ?Discharge Physician: Edwin Dada  ?  ?PCP: [name], MD  ?  ?Recommendations at discharge:  ?Follow up with Dr. Dennard Schaumann in 1 week ?[facility] to follow at home; may progress to need Hospice services sooner rather than later ?Follow up with Dr. Lamonte Sakai in 2-3 weeks, if physically able and not transitioning to Hospice sooner ?  ?  ?  ?  ?  ?Discharge Diagnoses: ?Principal Problem: ?  COPD exacerbation ([place]) ?Active Problems: ?  CAD (coronary artery disease) ?  GERD (gastroesophageal reflux disease) ?  Protein-calorie malnutrition, severe ?  Chronic respiratory failure with hypoxia and hypercapnia (HCC) - 2 L/min at rest, 5 L/min with activity, baseline POC2 50 mmHg ?  PAF (paroxysmal atrial fibrillation) () ?  Hypokalemia ?  Primary hypertension ?  PSVT (paroxysmal supraventricular tachycardia) ([place]) ?  Cognitive impairment ?  COPD with acute exacerbation ([place]) ?  ?  ?  ?  ?  ?  ?Hospital Course: ?Zachary Chambers is a 71 y.o. 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 ?  ?Has been argumentative, refusing medications, losing weight and increasingly confused lately.  The day of admission he went to PCP's office and was satting 65% on room air and fell getting out of the car.   ?  ?In the ER, CXR clear, required 4L O2, seemed confused, whezing, poor air movement.  Noted to have another episode of SVT. ?  ?  ?  ?  ?  ?  ?Assessment and Plan: ?* COPD exacerbation ([place]) ?Admitted and started on Solu-Medrol 40 twice daily, azithromycin,  Mucinex, flutter valve, scheduled bronchodilators, and PPI.   ?  ?The patient's desaturations with ambulation got slightly better with treatment but the patient noted no clinical improvement over 5 days treatment. ? ?He remained exceedingly weak from a high likelihood of fall, and likely to desaturate with exertion.  Given failure to respond to aggressive therapy, I suspect he is just at end stage now (was FEV1 43% 9 years ago). ?  ?I discussed Hospice with family, patient has hope for some remaining good quality of life.  I arranged Palliative Care to follow at D/c with low threshold to transition to Hospice, as patient has high symptom burden and extremely limited functional status.  ?   ?Cognitive impairment ?In discussion with girlfriend, it seems that he frequently has some O2 saturation 70% at home, has for some months.  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  Acute encephalopathy ruled out.    ?  ?  ?PSVT (paroxysmal supraventricular tachycardia) ([place]) ?S/p adenosine in EMS.   He was monitored on telemetry here for 48 hours, had only 2 brief episodes, metoprolol increased.    ? ?11/20/21 ?Here today for follow up from hospital.  Initially I was prepared to have a discussion regarding hospice.  However the patient has rallied and looks much better since coming home from the hospital.  He and his girlfriend  have gotten married after he was discharged from the hospital.  They are very proud.  They also recently purchased an RV that they took on a road trip.  He appears stronger today and more motivated to fight to live.  He is using Breztri 2 puffs twice daily and albuterol through the nebulizer 4 times daily.  He is about to discontinue prednisone however his wife states that he is done remarkably better following the prolonged prednisone taper.  They question if he could remain on prednisone long-term.  They realize that his life expectancy is short based on  his end-stage COPD and the prednisone truly adds to his quality of life.  At that time, my plan was: ? ?Today the patient is in normal sinus rhythm.  His breathing is back to his baseline.  He has markedly diminished breath sounds bilaterally but no expiratory wheezes.  He has no increased work of breathing.  No respiratory distress.  He seems much better today.  He seems much stronger and more vibrant.  We decided to continue prednisone 10 mg a day indefinitely with the goal to try to prolong and improve his quality of life.  I would like to see the patient back in 1 month and at that time consider weaning down 5 mg a day indefinitely and also recheck the patient.  We discussed hospice but at the present time they do not feel ready for this.  I congratulated them on her marriage. ? ?12/26/21 ?Patient is incontinent of urine.  He wears adult diapers every day.  Due to heat and moisture and wetness, the patient has an intertrigo-like rash in the creases of his thighs and around the scrotum that itches.  He is requesting a cream for that.  He is still taking 10 mg a day of prednisone.  That coupled with the breast tree has the patient doing quite well.  He is back to his baseline.  He is still extremely weak.  His blood pressure has been averaging between 90 and 100/60-70.  He denies any chest pain or angina.  He denies any syncope or near syncope although his blood pressure is very low on metoprolol and isosorbide.  However he has refractory angina without the medication forcing [place] to continue the medicine.  He has not had a recent COPD exacerbation since his last hospitalization.  He asked me today if he can drive.  I explained to the patient and his wife that I do not feel safe for him to drive given the delirium that he has been his oxygen levels are low.  When he is hypoxic he can become combative and confused.  For this reason I do not recommend that he drive.. ?  ?  ?Past Medical History:  ?Diagnosis Date  ? ACS  (acute coronary syndrome) ([place]) 04/19/2021  ? Alcohol abuse   ? Aortic atherosclerosis ([place])   ? CAD in native artery   ? Chronic respiratory failure ([place])   ? COPD (chronic obstructive pulmonary disease) ([place])   ? DJD (degenerative joint disease)   ? diffusely  ? GERD (gastroesophageal reflux disease)   ? Hyperlipidemia   ? On home O2   ? Paroxysmal atrial fibrillation (HCC)   ? Pericardial effusion   ? Pneumonia due to COVID-19 virus   ? PUD (peptic ulcer disease)   ? with bleeding  ? Rotator cuff disorder   ? has been evaluated by Dr Clifton James and [facility]  ? Stroke (cerebrum) ([place])   ? ? ?  Past Surgical History:  ?Procedure Laterality Date  ? COLONOSCOPY    ? EYE SURGERY    ? right eye removed at age 81  ? heart stent    ? x3- 2006  ? ?Current Outpatient Medications on File Prior to Visit  ?Medication Sig Dispense Refill  ? albuterol (PROVENTIL) (2.5 MG/3ML) 0.083% nebulizer solution Take 3 mLs (2.5 mg total) by nebulization every 4 (four) hours as needed for wheezing or shortness of breath. 180 mL 4  ? albuterol (VENTOLIN HFA) 108 (90 Base) MCG/ACT inhaler Inhale 2 puffs into the lungs every 6 (six) hours as needed for wheezing or shortness of breath. 18 g 5  ? apixaban (ELIQUIS) 5 MG TABS tablet Take 1 tablet (5 mg total) by mouth 2 (two) times daily. 180 tablet 3  ? aspirin 81 MG tablet Take 81 mg by mouth at bedtime.    ? atorvastatin (LIPITOR) 40 MG tablet Take 1 tablet (40 mg total) by mouth at bedtime. 90 tablet 3  ? benzonatate (TESSALON PERLES) 100 MG capsule Take 1 capsule (100 mg total) by mouth 3 (three) times daily as needed for cough. 60 capsule 11  ? Blood Glucose Calibration (TRUE METRIX LEVEL 1) Low SOLN USE AS DIRECTED. To monitor fsbs 2 times per day dx e11.9 1 each 3  ? Blood Glucose Monitoring Suppl (TRUE METRIX AIR GLUCOSE METER) DEVI USE AS DIRECTED. To monitor fsbs 2 times per day dx e11.9 1 each 3  ? Budeson-Glycopyrrol-Formoterol (BREZTRI AEROSPHERE) 160-9-4.8 MCG/ACT AERO Inhale 2 puffs  into the lungs 2 (two) times daily. 17.7 g 3  ? Cholecalciferol (D3 PO) Take 1 tablet by mouth daily.    ? Cyanocobalamin (VITAMIN B-12 PO) Take 1 tablet by mouth daily.    ? fluticasone (FLONASE) 50 MCG/ACT

## 2022-01-14 ENCOUNTER — Other Ambulatory Visit: Payer: Self-pay | Admitting: Family Medicine

## 2022-01-14 DIAGNOSIS — I4891 Unspecified atrial fibrillation: Secondary | ICD-10-CM

## 2022-01-15 NOTE — Telephone Encounter (Signed)
Requested Prescriptions  Pending Prescriptions Disp Refills  . ELIQUIS 5 MG TABS tablet [Pharmacy Med Name: Eliquis 5 mg tablet] 60 tablet 3    Sig: TAKE 1 TABLET BY MOUTH 2 TIMES DAILY     Hematology:  Anticoagulants - apixaban Failed - 01/14/2022 11:38 AM      Failed - HGB in normal range and within 360 days    Hemoglobin  Date Value Ref Range Status  11/05/2021 12.1 (L) 13.0 - 17.0 g/dL Final         Passed - PLT in normal range and within 360 days    Platelets  Date Value Ref Range Status  11/05/2021 334 150 - 400 K/uL Final         Passed - HCT in normal range and within 360 days    HCT  Date Value Ref Range Status  11/05/2021 39.4 39.0 - 52.0 % Final         Passed - Cr in normal range and within 360 days    Creat  Date Value Ref Range Status  05/26/2021 1.00 0.70 - 1.28 mg/dL Final   Creatinine, Ser  Date Value Ref Range Status  11/06/2021 1.00 0.61 - 1.24 mg/dL Final         Passed - AST in normal range and within 360 days    AST  Date Value Ref Range Status  11/05/2021 31 15 - 41 U/L Final         Passed - ALT in normal range and within 360 days    ALT  Date Value Ref Range Status  11/05/2021 13 0 - 44 U/L Final         Passed - Valid encounter within last 12 months    Recent Outpatient Visits          2 weeks ago Chronic obstructive pulmonary disease, unspecified COPD type ([place])   [place] [name], MD   1 month ago COPD exacerbation ([place])   [place] [name], MD   2 months ago COPD exacerbation ([place])   [place] [name], MD   7 months ago COPD exacerbation ([place])   [place] Dennard Schaumann, Cammie Mcgee, MD   10 months ago COPD exacerbation ([place])   [facility] Pickard, Cammie Mcgee, MD             . predniSONE (DELTASONE) 5 MG tablet [Pharmacy Med Name: prednisone 5 mg tablet] 60 tablet 1    Sig: TAKE 2 TABLETS BY MOUTH EVERY DAY WITH  BREAKFAST     Not Delegated - Endocrinology:  Oral Corticosteroids Failed - 01/14/2022 11:38 AM      Failed - This refill cannot be delegated      Failed - Manual Review: Eye exam for IOP if prolonged treatment      Failed - Glucose (serum) in normal range and within 180 days    Glucose, Bld  Date Value Ref Range Status  11/06/2021 157 (H) 70 - 99 mg/dL Final    Comment:    Glucose reference range applies only to samples taken after fasting for at least 8 hours.   Glucose-Capillary  Date Value Ref Range Status  03/11/2021 105 (H) 70 - 99 mg/dL Final    Comment:    Glucose reference range applies only to samples taken after fasting for at least 8 hours.         Failed -  Na in normal range and within 180 days    Sodium  Date Value Ref Range Status  11/06/2021 133 (L) 135 - 145 mmol/L Final         Failed - Bone Mineral Density or Dexa Scan completed in the last 2 years      Passed - K in normal range and within 180 days    Potassium  Date Value Ref Range Status  11/06/2021 3.9 3.5 - 5.1 mmol/L Final         Passed - Last BP in normal range    BP Readings from Last 1 Encounters:  12/26/21 110/62         Passed - Valid encounter within last 6 months    Recent Outpatient Visits          2 weeks ago Chronic obstructive pulmonary disease, unspecified COPD type ([place])   [place] Pickard, Cammie Mcgee, MD   1 month ago COPD exacerbation ([place])   [place] [name], MD   2 months ago COPD exacerbation [facility])   [place] [name], MD   7 months ago COPD exacerbation [facility])   [facility] [name], MD   10 months ago COPD exacerbation [facility])   [facility] Pick[facility], Cammie Mcgee, MD              Sent via Interface, too early, refills available

## 2022-01-15 NOTE — Telephone Encounter (Signed)
Requested medication (s) are due for refill today: No  Requested medication (s) are on the active medication list: yes    Last refill: 12/26/21  #90 3 refills  Future visit scheduled no  Notes to clinic:Not delegated, please review. Thank you.  Requested Prescriptions  Pending Prescriptions Disp Refills   predniSONE (DELTASONE) 5 MG tablet [Pharmacy Med Name: prednisone 5 mg tablet] 60 tablet 1    Sig: TAKE 2 TABLETS BY MOUTH EVERY DAY WITH BREAKFAST     Not Delegated - Endocrinology:  Oral Corticosteroids Failed - 01/14/2022 11:38 AM      Failed - This refill cannot be delegated      Failed - Manual Review: Eye exam for IOP if prolonged treatment      Failed - Glucose (serum) in normal range and within 180 days    Glucose, Bld  Date Value Ref Range Status  11/06/2021 157 (H) 70 - 99 mg/dL Final    Comment:    Glucose reference range applies only to samples taken after fasting for at least 8 hours.   Glucose-Capillary  Date Value Ref Range Status  03/11/2021 105 (H) 70 - 99 mg/dL Final    Comment:    Glucose reference range applies only to samples taken after fasting for at least 8 hours.         Failed - Na in normal range and within 180 days    Sodium  Date Value Ref Range Status  11/06/2021 133 (L) 135 - 145 mmol/L Final         Failed - Bone Mineral Density or Dexa Scan completed in the last 2 years      Passed - K in normal range and within 180 days    Potassium  Date Value Ref Range Status  11/06/2021 3.9 3.5 - 5.1 mmol/L Final         Passed - Last BP in normal range    BP Readings from Last 1 Encounters:  12/26/21 110/62         Passed - Valid encounter within last 6 months    Recent Outpatient Visits           2 weeks ago Chronic obstructive pulmonary disease, unspecified COPD type ([place])   [place] Pickard, Cammie Mcgee, MD   1 month ago COPD exacerbation ([place])   [place] [name], MD   2 months ago COPD  exacerbation [facility])   [place] [name], MD   7 months ago COPD exacerbation ([place])   [place] Dennard Schaumann, Cammie Mcgee, MD   10 months ago COPD exacerbation ([place])   [place] Pickard, Cammie Mcgee, MD               Refused Prescriptions Disp Refills   ELIQUIS 5 MG TABS tablet [Pharmacy Med Name: Eliquis 5 mg tablet] 60 tablet 3    Sig: TAKE 1 TABLET BY MOUTH 2 TIMES DAILY     Hematology:  Anticoagulants - apixaban Failed - 01/14/2022 11:38 AM      Failed - HGB in normal range and within 360 days    Hemoglobin  Date Value Ref Range Status  11/05/2021 12.1 (L) 13.0 - 17.0 g/dL Final         Passed - PLT in normal range and within 360 days    Platelets  Date Value Ref Range Status  11/05/2021 334 150 - 400 K/uL Final  Passed - HCT in normal range and within 360 days    HCT  Date Value Ref Range Status  11/05/2021 39.4 39.0 - 52.0 % Final         Passed - Cr in normal range and within 360 days    Creat  Date Value Ref Range Status  05/26/2021 1.00 0.70 - 1.28 mg/dL Final   Creatinine, Ser  Date Value Ref Range Status  11/06/2021 1.00 0.61 - 1.24 mg/dL Final         Passed - AST in normal range and within 360 days    AST  Date Value Ref Range Status  11/05/2021 31 15 - 41 U/L Final         Passed - ALT in normal range and within 360 days    ALT  Date Value Ref Range Status  11/05/2021 13 0 - 44 U/L Final         Passed - Valid encounter within last 12 months    Recent Outpatient Visits           2 weeks ago Chronic obstructive pulmonary disease, unspecified COPD type ([place])   [place] Pickard, Cammie Mcgee, MD   1 month ago COPD exacerbation ([place])   [place] [name], MD   2 months ago COPD exacerbation ([place])   [place] [name], MD   7 months ago COPD exacerbation [facility])   [place] [name], MD   10 months ago COPD exacerbation [facility])   [facility] Pickard, Cammie Mcgee, MD

## 2022-02-11 ENCOUNTER — Other Ambulatory Visit: Payer: Self-pay | Admitting: Family Medicine

## 2022-02-11 NOTE — Telephone Encounter (Signed)
Requested medication (s) are due for refill today: no  Requested medication (s) are on the active medication list: yes  Last refill:  12/26/21 30 grams with 1 RF  Future visit scheduled: no  Notes to clinic:  called pharmacist Barnett Applebaum at [facility]. They are preparing the refill he has left to pick up today.   Requested Prescriptions  Pending Prescriptions Disp Refills   clotrimazole-betamethasone (LOTRISONE) cream [Pharmacy Med Name: clotrimazole-betamethasone 1 %-0.05 % topical cream] 30 g 1    Sig: apply TO SKIN 2 TIMES DAILY     Off-Protocol Failed - 02/11/2022  2:06 PM      Failed - Medication not assigned to a protocol, review manually.      Passed - Valid encounter within last 12 months    Recent Outpatient Visits           1 month ago Chronic obstructive pulmonary disease, unspecified COPD type ([place])   [place] Pickard, Cammie Mcgee, MD   2 months ago COPD exacerbation ([place])   [place] [name], MD   3 months ago COPD exacerbation [facility])   [place] [name], MD   8 months ago COPD exacerbation [facility])   [place] [name], MD   11 months ago COPD exacerbation [facility])   [facility] Pickard, Cammie Mcgee, MD

## 2022-03-02 ENCOUNTER — Other Ambulatory Visit: Payer: Self-pay | Admitting: Family Medicine

## 2022-03-05 ENCOUNTER — Telehealth: Payer: Self-pay

## 2022-03-05 NOTE — Telephone Encounter (Signed)
Pt's wife called in inquiring about pt's refill of folic acid (FOLVITE) 1 MG tablet. Pt's wife states that pharmacy asked for them to call office to request refills for this med. Please advise.  Cb#: [phone]

## 2022-03-05 NOTE — Telephone Encounter (Signed)
Called pt's wife back, LM to return call tom 03/06/22

## 2022-03-14 DIAGNOSIS — I4891 Unspecified atrial fibrillation: Secondary | ICD-10-CM | POA: Diagnosis not present

## 2022-03-14 DIAGNOSIS — J1282 Pneumonia due to coronavirus disease 2019: Secondary | ICD-10-CM | POA: Diagnosis not present

## 2022-03-14 DIAGNOSIS — U071 COVID-19: Secondary | ICD-10-CM | POA: Diagnosis not present

## 2022-03-14 DIAGNOSIS — J9621 Acute and chronic respiratory failure with hypoxia: Secondary | ICD-10-CM | POA: Diagnosis not present

## 2022-03-14 DIAGNOSIS — J449 Chronic obstructive pulmonary disease, unspecified: Secondary | ICD-10-CM | POA: Diagnosis not present

## 2022-03-14 DIAGNOSIS — R0600 Dyspnea, unspecified: Secondary | ICD-10-CM | POA: Diagnosis not present

## 2022-04-02 NOTE — Progress Notes (Signed)
Office Visit    Patient Name: Zachary Chambers Date of Encounter: 04/03/2022  Primary Care Provider:  [name], MD Primary Cardiologist:  [name], MD Primary Electrophysiologist: None  Chief Complaint    Zachary LYNTON CRESCENZO is a 71 y.o. 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/p PCI to LCx 2002 with staged PCI to LAD 2003), PAF, HTN, PSVT, COPD, GERD, tobacco abuse, HLD, CVA, chronic hypoxic respiratory failure on O2 after COVID PNA who presents for 88-monthfollow-up  Past Medical History    Past Medical History:  Diagnosis Date   ACS (acute coronary syndrome) ([place] 04/19/2021   Alcohol abuse    Aortic atherosclerosis (HCC)    CAD in native artery    Chronic respiratory failure (HCC)    COPD (chronic obstructive pulmonary disease) ([place]    DJD (degenerative joint disease)    diffusely   GERD (gastroesophageal reflux disease)    Hyperlipidemia    On home O2    Paroxysmal atrial fibrillation (HCC)    Pericardial effusion    Pneumonia due to COVID-19 virus    PUD (peptic ulcer disease)    with bleeding   Rotator cuff disorder    has been evaluated by Dr CClifton Jamesand [facility]   Stroke (cerebrum) ([facility]    Past Surgical History:  Procedure Laterality Date   COLONOSCOPY     EYE SURGERY     right eye removed at age 71  heart stent     x3- 2006    Allergies  No Known Allergies  History of Present Illness    Zachary Chambers a 71year old male with the above-mentioned past medical history presents today for 673-monthollow-up.  ZacharyChambers had an inferior lateral MI in 2002 that was treated with DES to left circumflex and repeat cath in 2003 with staged PCI/DES to LAD with residual mild RCA disease.  He had a nuclear stress test completed 02/2020 that was normal.  He was admitted in 06/2020 with COVID-pneumonia that required O2 at discharge.  Patient had report of transient AF during admission and Eliquis was started outpatient by primary care.  Cardiology was not  consulted and patient was seen in outpatient on 08/2020 with complaint of right-sided weakness with imaging showing CVA.  He was admitted in 11/2020 with COPD exacerbation and mild pericardial effusion with evidence of tamponade on 2D echo.  He was admitted on 02/2021 with plaint of hallucinations and AMD with chest pain.  Troponin's were elevated and trending 443>> 535 >> 645 >>577.  Repeat 2D echo was completed with EF of 65-70% indeterminate diastolic parameters, small circumferential pericardial effusion, mild calcification on the aortic valve with no stenosis.  He was started on metoprolol for runs of SVT that was discontinued during readmission in 04/2021 for chest pain.  He was seen last by CaLaurann MontanaNP on 05/2021 for posthospital follow-up.  During visit patient had complaints of dyspnea on exertion with productive cough and improved wheezing.  He denied anginal symptoms and patient was continued on anticoagulation.  Mr. HoWainerresents today for 6-55-monthllow-up.  He is accompanied today by his wife for today's appointment.  He has no cardiac complaints today and is euvolemic on examination.  He has been taking all medications without any side effects or complaints.  He has not follow-up with his pulmonologist regarding his respiratory status.  We discussed the need to potentially have him participate in pulmonary rehab. Patient denies chest pain,  palpitations, dyspnea, PND, orthopnea, nausea, vomiting, dizziness, syncope, edema, weight gain, or early satiety.  Home Medications    Current Outpatient Medications  Medication Sig Dispense Refill   albuterol (PROVENTIL) (2.5 MG/3ML) 0.083% nebulizer solution Take 3 mLs (2.5 mg total) by nebulization every 4 (four) hours as needed for wheezing or shortness of breath. 180 mL 4   albuterol (VENTOLIN HFA) 108 (90 Base) MCG/ACT inhaler Inhale 2 puffs into the lungs every 6 (six) hours as needed for wheezing or shortness of breath. 18 g 5   apixaban  (ELIQUIS) 5 MG TABS tablet Take 1 tablet (5 mg total) by mouth 2 (two) times daily. 180 tablet 3   aspirin 81 MG tablet Take 81 mg by mouth at bedtime.     atorvastatin (LIPITOR) 40 MG tablet Take 1 tablet (40 mg total) by mouth at bedtime. 90 tablet 3   benzonatate (TESSALON PERLES) 100 MG capsule Take 1 capsule (100 mg total) by mouth 3 (three) times daily as needed for cough. 60 capsule 11   Blood Glucose Calibration (TRUE METRIX LEVEL 1) Low SOLN USE AS DIRECTED. To monitor fsbs 2 times per day dx e11.9 1 each 3   Blood Glucose Monitoring Suppl (TRUE METRIX AIR GLUCOSE METER) DEVI USE AS DIRECTED. To monitor fsbs 2 times per day dx e11.9 1 each 3   Budeson-Glycopyrrol-Formoterol (BREZTRI AEROSPHERE) 160-9-4.8 MCG/ACT AERO Inhale 2 puffs into the lungs 2 (two) times daily. 17.7 g 3   Cholecalciferol (D3 PO) Take 1 tablet by mouth daily.     clotrimazole-betamethasone (LOTRISONE) cream apply TO SKIN 2 TIMES DAILY 30 g 1   Cyanocobalamin (VITAMIN B-12 PO) Take 1 tablet by mouth daily.     fluticasone (FLONASE) 50 MCG/ACT nasal spray Place 2 sprays into both nostrils daily. 48 g 3   glucose blood (RELION TRUE METRIX TEST STRIPS) test strip USE AS DIRECTED. To monitor fsbs 2 times per day dx e11.9 100 each 3   guaiFENesin (MUCINEX) 600 MG 12 hr tablet Take 1 tablet (600 mg total) by mouth 2 (two) times daily.     isosorbide mononitrate (IMDUR) 30 MG 24 hr tablet Take 0.5 tablets (15 mg total) by mouth daily. 45 tablet 3   LORazepam (ATIVAN) 0.5 MG tablet TAKE 1 TABLET  2 (TWO) TIMES DAILY AS NEEDED FOR ANXIETY. 60 tablet 0   magnesium oxide (MAG-OX) 400 (240 Mg) MG tablet Take 1 tablet (400 mg total) by mouth daily. 100 tablet 0   metoprolol tartrate (LOPRESSOR) 50 MG tablet Take 1 tablet (50 mg total) by mouth 2 (two) times daily. 60 tablet 3   Multiple Vitamin (MULTIVITAMIN WITH MINERALS) TABS tablet Take 1 tablet by mouth daily. 90 tablet 0   nitroGLYCERIN (NITROSTAT) 0.4 MG SL tablet DISSOLVE 1  TABLET UNDER THE TONGUE EVERY 5 MINUTES AS NEEDED FOR CHEST PAIN 25 tablet 0   omeprazole (PRILOSEC) 40 MG capsule Take 1 capsule (40 mg total) by mouth daily. 90 capsule 3   predniSONE (DELTASONE) 5 MG tablet Take 1 tablet (5 mg total) by mouth daily with breakfast. 90 tablet 3   QUEtiapine (SEROQUEL) 25 MG tablet Take 1 tablet (25 mg total) by mouth at bedtime. 90 tablet 3   No current facility-administered medications for this visit.     Review of Systems  Please see the history of present illness.    (+) Shortness of breath with exertion (+) Weakness  All other systems reviewed and are otherwise negative except as noted above.  Physical Exam    Wt Readings from Last 3 Encounters:  04/03/22 125 lb (56.7 kg)  12/26/21 122 lb (55.3 kg)  11/08/21 116 lb 2.9 oz (52.7 kg)   VS: Vitals:   04/03/22 1354  BP: (!) 146/72  Pulse: 87  SpO2: 91%  ,Body mass index is 19.01 kg/m.  Constitutional:      Appearance: Healthy appearance. Not in distress.  Neck:     Vascular: JVD normal.  Pulmonary:     Effort: Pulmonary effort is normal.     Breath sounds: Bibasilar wheezing. No rales. Diminished in the bases Cardiovascular:     Normal rate. Regular rhythm. Normal S1. Normal S2.      Murmurs: There is no murmur.  Edema:    Peripheral edema absent.  Abdominal:     Palpations: Abdomen is soft non tender. There is no hepatomegaly.  Skin:    General: Skin is warm and dry.  Neurological:     General: No focal deficit present.     Mental Status: Alert and oriented to person, place and time.     Cranial Nerves: Cranial nerves are intact.  EKG/LABS/Other Studies Reviewed    ECG personally reviewed by me today -none completed today  Risk Assessment/Calculations:    CHA2DS2-VASc Score = 5   This indicates a 7.2% annual risk of stroke. The patient's score is based upon: CHF History: 0 HTN History: 1 Diabetes History: 0 Stroke History: 2 Vascular Disease History: 1 Age Score:  1 Gender Score: 0           Lab Results  Component Value Date   WBC 5.2 11/05/2021   HGB 12.1 (L) 11/05/2021   HCT 39.4 11/05/2021   MCV 74.9 (L) 11/05/2021   PLT 334 11/05/2021   Lab Results  Component Value Date   CREATININE 1.00 11/06/2021   BUN 14 11/06/2021   NA 133 (L) 11/06/2021   K 3.9 11/06/2021   CL 95 (L) 11/06/2021   CO2 26 11/06/2021   Lab Results  Component Value Date   ALT 13 11/05/2021   AST 31 11/05/2021   ALKPHOS 78 11/05/2021   BILITOT 0.8 11/05/2021   Lab Results  Component Value Date   CHOL 154 03/12/2021   HDL 55 03/12/2021   LDLCALC 92 03/12/2021   TRIG 35 03/12/2021   CHOLHDL 2.8 03/12/2021    Lab Results  Component Value Date   HGBA1C 5.4 03/06/2020    Assessment & Plan    1.  Coronary artery disease: -inferolateral MI 2002 s/p PCI to LCx 2002 with staged PCI to LAD 2003 -Patient reports no chest pain or shortness of breath  2.  Paroxysmal atrial fibrillation/SVT: -Patient developed short runs of SVT during hospitalization in 2022. -Continue Eliquis 5 mg twice daily  3.  Chronic respiratory failure with hypoxia and COPD: -Managed by PCP but patient would prefer pulmonology follow-up. -Ambulatory referral to pulmonology  4.  Hyperlipidemia: -Continue atorvastatin 40 mg daily   5.  Tobacco abuse: -Smoking cessation was advised and encouraged  Disposition: Follow-up with [name], MD or APP in 3 months   Medication Adjustments/Labs and Tests Ordered: Current medicines are reviewed at length with the patient today.  Concerns regarding medicines are outlined above.   Signed, Mable Fill, Marissa Nestle, NP 04/03/2022, 3:19 PM [place]

## 2022-04-03 ENCOUNTER — Encounter: Payer: Self-pay | Admitting: Nurse Practitioner

## 2022-04-03 ENCOUNTER — Ambulatory Visit: Payer: Medicare HMO | Admitting: Nurse Practitioner

## 2022-04-03 VITALS — BP 146/72 | HR 87 | Ht 68.0 in | Wt 125.0 lb

## 2022-04-03 DIAGNOSIS — R0609 Other forms of dyspnea: Secondary | ICD-10-CM | POA: Diagnosis not present

## 2022-04-03 DIAGNOSIS — F17200 Nicotine dependence, unspecified, uncomplicated: Secondary | ICD-10-CM | POA: Diagnosis not present

## 2022-04-03 DIAGNOSIS — J441 Chronic obstructive pulmonary disease with (acute) exacerbation: Secondary | ICD-10-CM

## 2022-04-03 DIAGNOSIS — I480 Paroxysmal atrial fibrillation: Secondary | ICD-10-CM

## 2022-04-03 DIAGNOSIS — E782 Mixed hyperlipidemia: Secondary | ICD-10-CM

## 2022-04-03 NOTE — Patient Instructions (Signed)
Medication Instructions:  Your physician recommends that you continue on your current medications as directed. Please refer to the Current Medication list given to you today.  *If you need a refill on your cardiac medications before your next appointment, please call your pharmacy*  Eliquis assistance: What do I do if I can't afford ELIQUIS? Prescription Coverage Assistance  Call Tap to call 1-855-ELIQUIS [phone]) from Monday - Friday, 8 AM - 8 PM (ET). Live specialists are here to: Help you find out if Arne [place] is covered by your insurance plan. Determine if you are eligible for assistance paying for ELIQUIS.  Follow-Up: At [facility], you and your health needs are our priority.  As part of our continuing mission to provide you with exceptional heart care, we have created designated Provider Care Teams.  These Care Teams include your primary Cardiologist (physician) and Advanced Practice Providers (APPs -  Physician Assistants and Nurse Practitioners) who all work together to provide you with the care you need, when you need it.  We recommend signing up for the patient portal called "MyChart".  Sign up information is provided on this After Visit Summary.  MyChart is used to connect with patients for Virtual Visits (Telemedicine).  Patients are able to view lab/test results, encounter notes, upcoming appointments, etc.  Non-urgent messages can be sent to your provider as well.   To learn more about what you can do with MyChart, go to [email or web address].    Your next appointment:   3 month(s)  The format for your next appointment:   In Person  Provider:   [name], MD { 1  Other Instructions You have been referred to Pulmonology. Someone will contact you to set up this appointment.

## 2022-04-13 ENCOUNTER — Other Ambulatory Visit: Payer: Self-pay

## 2022-04-14 ENCOUNTER — Other Ambulatory Visit: Payer: Self-pay | Admitting: Family Medicine

## 2022-04-14 DIAGNOSIS — J9621 Acute and chronic respiratory failure with hypoxia: Secondary | ICD-10-CM | POA: Diagnosis not present

## 2022-04-14 DIAGNOSIS — J1282 Pneumonia due to coronavirus disease 2019: Secondary | ICD-10-CM | POA: Diagnosis not present

## 2022-04-14 DIAGNOSIS — J449 Chronic obstructive pulmonary disease, unspecified: Secondary | ICD-10-CM | POA: Diagnosis not present

## 2022-04-14 DIAGNOSIS — R0600 Dyspnea, unspecified: Secondary | ICD-10-CM | POA: Diagnosis not present

## 2022-04-14 DIAGNOSIS — I4891 Unspecified atrial fibrillation: Secondary | ICD-10-CM | POA: Diagnosis not present

## 2022-04-14 DIAGNOSIS — U071 COVID-19: Secondary | ICD-10-CM | POA: Diagnosis not present

## 2022-04-14 MED ORDER — QUETIAPINE FUMARATE 25 MG PO TABS: 25.0000 mg | ORAL_TABLET | Freq: Every day | ORAL | 3 refills | Status: DC

## 2022-04-14 NOTE — Progress Notes (Unsigned)
LOV 12/26/21 Last refill 06/02/21, #90, 3 refills  Please review, thanks!

## 2022-04-17 DIAGNOSIS — J449 Chronic obstructive pulmonary disease, unspecified: Secondary | ICD-10-CM | POA: Diagnosis not present

## 2022-04-27 ENCOUNTER — Other Ambulatory Visit: Payer: Self-pay | Admitting: Family Medicine

## 2022-04-28 ENCOUNTER — Other Ambulatory Visit: Payer: Self-pay | Admitting: Family Medicine

## 2022-04-28 DIAGNOSIS — I4891 Unspecified atrial fibrillation: Secondary | ICD-10-CM

## 2022-04-28 NOTE — Telephone Encounter (Signed)
Received efax from pharmacy to request refill of:  apixaban (ELIQUIS) 5 MG TABS tablet 180 tablet 3 09/03/2021    Sig - Route: Take 1 tablet (5 mg total) by mouth 2 (two) times daily. - Oral   Sent to pharmacy as: apixaban (ELIQUIS) 5 MG Tab tablet   Notes to Pharmacy: This prescription was filled on 07/15/2021. Any refills authorized will be placed on file.   E-Prescribing Status: Receipt confirmed by pharmacy (09/03/2021  5:08 PM EST)     Pharmacy:  [facility] [place] [place] - [address]  [address], [place] [address]  Phone:  [phone]  Fax:  [phone]   LOV: 12/26/2021  Please advise at [phone].

## 2022-04-29 ENCOUNTER — Other Ambulatory Visit: Payer: Self-pay | Admitting: Family Medicine

## 2022-04-29 NOTE — Telephone Encounter (Signed)
Requested Prescriptions  Pending Prescriptions Disp Refills  . LORazepam (ATIVAN) 0.5 MG tablet [Pharmacy Med Name: LORAZEPAM 0.5 MG Tablet] 60 tablet     Sig: TAKE 1 TABLET TWICE DAILY AS NEEDED FOR ANXIETY     Not Delegated - Psychiatry: Anxiolytics/Hypnotics 2 Failed - 04/29/2022  4:53 PM      Failed - This refill cannot be delegated      Failed - Urine Drug Screen completed in last 360 days      Passed - Patient is not pregnant      Passed - Valid encounter within last 6 months    Recent Outpatient Visits          4 months ago Chronic obstructive pulmonary disease, unspecified COPD type ([place])   [place] Pickard, Cammie Mcgee, MD   5 months ago COPD exacerbation ([place])   [place] [name], MD   5 months ago COPD exacerbation ([place])   [place] [name], MD   11 months ago COPD exacerbation [facility])   [place] [facility] Pickard, Cammie Mcgee, MD   1 year ago COPD exacerbation [facility])   [place] Pickard, Cammie Mcgee, MD      Future Appointments            In 1 week Byrum, Rose Fillers, MD [place] [facility]   In 1 month [name], Wallis Bamberg, MD [place]. [facility]           . nitroGLYCERIN (NITROSTAT) 0.4 MG SL tablet [Pharmacy Med Name: NITROGLYCERIN 0.4 MG Tablet Sublingual] 25 tablet 0    Sig: DISSOLVE 1 TABLET UNDER THE TONGUE EVERY 5 MINUTES AS NEEDED FOR CHEST PAIN     Cardiovascular:  Nitrates Failed - 04/29/2022  4:53 PM      Failed - Last BP in normal range    BP Readings from Last 1 Encounters:  04/03/22 (!) 146/72         Passed - Last Heart Rate in normal range    Pulse Readings from Last 1 Encounters:  04/03/22 87         Passed - Valid encounter within last 12 months    Recent Outpatient Visits          4 months ago Chronic obstructive pulmonary disease, unspecified COPD type ([place])   [place] Pickard, Cammie Mcgee, MD   5 months ago COPD exacerbation ([place])   [place] [name], MD   5 months ago COPD exacerbation ([place])   [place] [name], MD   11 months ago COPD exacerbation [facility])   [place] [facility] Pickard, Cammie Mcgee, MD   1 year ago COPD exacerbation [facility])   [place] Pickard, Cammie Mcgee, MD      Future Appointments            In 1 week Byrum, Rose Fillers, MD [place] [facility]   In 1 month [name], Wallis Bamberg, MD [place] [facility] [place]. [facility], [facility]           Refused Prescriptions Disp Refills  . metoprolol tartrate (LOPRESSOR) 25 MG tablet [Pharmacy Med Name: METOPROLOL TARTRATE 25 MG Tablet] 180 tablet 3    Sig: TAKE 1 TABLET TWICE DAILY     Cardiovascular:  Beta  Blockers Failed - 04/29/2022  4:53 PM      Failed - Last BP in normal range    BP Readings from Last 1 Encounters:  04/03/22 (!) 146/72         Passed - Last Heart Rate in normal range    Pulse Readings from Last 1 Encounters:  04/03/22 87         Passed - Valid encounter within last 6 months    Recent Outpatient Visits          4 months ago Chronic obstructive pulmonary disease, unspecified COPD type ([place])   [place] [name], MD   5 months ago COPD exacerbation ([place])   [place] [name], MD   5 months ago COPD exacerbation ([place])   [place] [name], MD   11 months ago COPD exacerbation [facility])   [place] [facility] Pickard, Cammie Mcgee, MD   1 year ago COPD exacerbation [facility])   [place] Medicine Pickard, Cammie Mcgee, MD      Future Appointments            In 1 week Byrum, Rose Fillers, MD [place] [facility]   In 1 month [name], Wallis Bamberg, MD [address] [facility] [place]. [facility], [facility]           . predniSONE  (DELTASONE) 20 MG tablet [Pharmacy Med Name: PREDNISONE 20 MG Tablet] 12 tablet 0    Sig: TAKE 3 TABLETS DAILY ON DAYS 1-2, THEN 2 TABS EVERY DAY ON DAYS 3-4, THEN 1 TAB EVERY DAY ON DAYS 5-6     Not Delegated - Endocrinology:  Oral Corticosteroids Failed - 04/29/2022  4:53 PM      Failed - This refill cannot be delegated      Failed - Manual Review: Eye exam for IOP if prolonged treatment      Failed - Glucose (serum) in normal range and within 180 days    Glucose, Bld  Date Value Ref Range Status  11/06/2021 157 (H) 70 - 99 mg/dL Final    Comment:    Glucose reference range applies only to samples taken after fasting for at least 8 hours.   Glucose-Capillary  Date Value Ref Range Status  03/11/2021 105 (H) 70 - 99 mg/dL Final    Comment:    Glucose reference range applies only to samples taken after fasting for at least 8 hours.         Failed - Na in normal range and within 180 days    Sodium  Date Value Ref Range Status  11/06/2021 133 (L) 135 - 145 mmol/L Final         Failed - Last BP in normal range    BP Readings from Last 1 Encounters:  04/03/22 (!) 146/72         Failed - Bone Mineral Density or Dexa Scan completed in the last 2 years      Passed - K in normal range and within 180 days    Potassium  Date Value Ref Range Status  11/06/2021 3.9 3.5 - 5.1 mmol/L Final         Passed - Valid encounter within last 6 months    Recent Outpatient Visits          4 months ago Chronic obstructive pulmonary disease, unspecified COPD type ([place])   [place]  [name], MD   5 months ago COPD exacerbation [facility])   [place] Dennard Schaumann, Cammie Mcgee, MD   5 months ago COPD exacerbation [facility])   [place] Dennard Schaumann Cammie Mcgee, MD   11 months ago COPD exacerbation [facility])   [place] [facility] Pickard, Cammie Mcgee, MD   1 year ago COPD exacerbation [facility])   [place] [facility] Pickard, Cammie Mcgee, MD       Future Appointments            In 1 week Byrum, Rose Fillers, MD [place] [facility]   In 1 month [name], Wallis Bamberg, MD [facility] [place]. [facility], [facility]

## 2022-04-29 NOTE — Telephone Encounter (Signed)
Requested medication (s) are due for refill today: yes   Requested medication (s) are on the active medication list: yes  Last refill:  ativan- 12/09/21 #60 0 refills, nitroglycerin - 05/21/21 #25 0 refills  Future visit scheduled: no   Notes to clinic:  not delegated per protocol . Do you want to refill Rxs?     Requested Prescriptions  Pending Prescriptions Disp Refills   LORazepam (ATIVAN) 0.5 MG tablet [Pharmacy Med Name: LORAZEPAM 0.5 MG Tablet] 60 tablet     Sig: TAKE 1 TABLET TWICE DAILY AS NEEDED FOR ANXIETY     Not Delegated - Psychiatry: Anxiolytics/Hypnotics 2 Failed - 04/29/2022  4:53 PM      Failed - This refill cannot be delegated      Failed - Urine Drug Screen completed in last 360 days      Passed - Patient is not pregnant      Passed - Valid encounter within last 6 months    Recent Outpatient Visits           4 months ago Chronic obstructive pulmonary disease, unspecified COPD type ([place])   [place] Pickard, Cammie Mcgee, MD   5 months ago COPD exacerbation ([place])   [place] [name], MD   5 months ago COPD exacerbation ([place])   [place] [name], MD   11 months ago COPD exacerbation [facility])   [place] [facility] Pickard, Cammie Mcgee, MD   1 year ago COPD exacerbation [facility])   [place] Pickard, Cammie Mcgee, MD       Future Appointments             In 1 week Byrum, Rose Fillers, MD [facility]   In 1 month [name], Wallis Bamberg, MD [place]. [facility], [facility]             nitroGLYCERIN (NITROSTAT) 0.4 MG SL tablet [Pharmacy Med Name: NITROGLYCERIN 0.4 MG Tablet Sublingual] 25 tablet 0    Sig: DISSOLVE 1 TABLET UNDER THE TONGUE EVERY 5 MINUTES AS NEEDED FOR CHEST PAIN     Cardiovascular:  Nitrates Failed - 04/29/2022  4:53 PM      Failed - Last BP in normal range    BP Readings from Last 1 Encounters:   04/03/22 (!) 146/72         Passed - Last Heart Rate in normal range    Pulse Readings from Last 1 Encounters:  04/03/22 87         Passed - Valid encounter within last 12 months    Recent Outpatient Visits           4 months ago Chronic obstructive pulmonary disease, unspecified COPD type ([place])   [place] Pickard, Cammie Mcgee, MD   5 months ago COPD exacerbation ([place])   [place] [name], MD   5 months ago COPD exacerbation ([place])   [place] [name], MD   11 months ago COPD exacerbation [facility])   [facility] Pickard, Cammie Mcgee, MD   1 year ago COPD exacerbation [facility])   [facility] Pickard, Cammie Mcgee, MD       Future Appointments             In 1 week Byrum, Rose Fillers, MD [place] [facility]   In  1 month Josue Hector, MD [place]. [facility], [facility]            Refused Prescriptions Disp Refills   metoprolol tartrate (LOPRESSOR) 25 MG tablet [Pharmacy Med Name: METOPROLOL TARTRATE 25 MG Tablet] 180 tablet 3    Sig: TAKE 1 TABLET TWICE DAILY     Cardiovascular:  Beta Blockers Failed - 04/29/2022  4:53 PM      Failed - Last BP in normal range    BP Readings from Last 1 Encounters:  04/03/22 (!) 146/72         Passed - Last Heart Rate in normal range    Pulse Readings from Last 1 Encounters:  04/03/22 87         Passed - Valid encounter within last 6 months    Recent Outpatient Visits           4 months ago Chronic obstructive pulmonary disease, unspecified COPD type ([place])   [place] Pickard, Cammie Mcgee, MD   5 months ago COPD exacerbation ([place])   [place] [name], MD   5 months ago COPD exacerbation ([place])   [place] [name], MD   11 months ago COPD exacerbation [facility])   [place] [facility] Pickard, Cammie Mcgee,  MD   1 year ago COPD exacerbation [facility])   [place] Pickard, Cammie Mcgee, MD       Future Appointments             In 1 week Byrum, Rose Fillers, MD [place] [facility]   In 1 month [name], Wallis Bamberg, MD [place] [facility] [place]. [facility], [facility]             predniSONE (DELTASONE) 20 MG tablet [Pharmacy Med Name: PREDNISONE 20 MG Tablet] 12 tablet 0    Sig: TAKE 3 TABLETS DAILY ON DAYS 1-2, THEN 2 TABS EVERY DAY ON DAYS 3-4, THEN 1 TAB EVERY DAY ON DAYS 5-6     Not Delegated - Endocrinology:  Oral Corticosteroids Failed - 04/29/2022  4:53 PM      Failed - This refill cannot be delegated      Failed - Manual Review: Eye exam for IOP if prolonged treatment      Failed - Glucose (serum) in normal range and within 180 days    Glucose, Bld  Date Value Ref Range Status  11/06/2021 157 (H) 70 - 99 mg/dL Final    Comment:    Glucose reference range applies only to samples taken after fasting for at least 8 hours.   Glucose-Capillary  Date Value Ref Range Status  03/11/2021 105 (H) 70 - 99 mg/dL Final    Comment:    Glucose reference range applies only to samples taken after fasting for at least 8 hours.         Failed - Na in normal range and within 180 days    Sodium  Date Value Ref Range Status  11/06/2021 133 (L) 135 - 145 mmol/L Final         Failed - Last BP in normal range    BP Readings from Last 1 Encounters:  04/03/22 (!) 146/72         Failed - Bone Mineral Density or Dexa Scan completed in the last 2 years      Passed -  K in normal range and within 180 days    Potassium  Date Value Ref Range Status  11/06/2021 3.9 3.5 - 5.1 mmol/L Final         Passed - Valid encounter within last 6 months    Recent Outpatient Visits           4 months ago Chronic obstructive pulmonary disease, unspecified COPD type ([place])   [place] Pickard, Cammie Mcgee, MD   5 months ago COPD exacerbation ([place])    [place] [name], MD   5 months ago COPD exacerbation ([place])   [place] [name], MD   11 months ago COPD exacerbation [facility])   [place] [facility] Pickard, Cammie Mcgee, MD   1 year ago COPD exacerbation [facility])   [place] [facility] Pickard, Cammie Mcgee, MD       Future Appointments             In 1 week Byrum, Rose Fillers, MD  Pulmonary Care   In 1 month [name], Wallis Bamberg, MD [place] [facility] [place]. [facility], [facility]

## 2022-04-29 NOTE — Telephone Encounter (Signed)
Unable to refill per protocol, request is too soon. Last RF was 09/03/21 for 180 and 3 RF. Receipt confirmed by pharmacy (09/03/2021 5:08 PM). Will refuse request.  Requested Prescriptions  Pending Prescriptions Disp Refills  . apixaban (ELIQUIS) 5 MG TABS tablet 180 tablet 3    Sig: Take 1 tablet (5 mg total) by mouth 2 (two) times daily.     Hematology:  Anticoagulants - apixaban Failed - 04/28/2022 11:49 AM      Failed - HGB in normal range and within 360 days    Hemoglobin  Date Value Ref Range Status  11/05/2021 12.1 (L) 13.0 - 17.0 g/dL Final         Passed - PLT in normal range and within 360 days    Platelets  Date Value Ref Range Status  11/05/2021 334 150 - 400 K/uL Final         Passed - HCT in normal range and within 360 days    HCT  Date Value Ref Range Status  11/05/2021 39.4 39.0 - 52.0 % Final         Passed - Cr in normal range and within 360 days    Creat  Date Value Ref Range Status  05/26/2021 1.00 0.70 - 1.28 mg/dL Final   Creatinine, Ser  Date Value Ref Range Status  11/06/2021 1.00 0.61 - 1.24 mg/dL Final         Passed - AST in normal range and within 360 days    AST  Date Value Ref Range Status  11/05/2021 31 15 - 41 U/L Final         Passed - ALT in normal range and within 360 days    ALT  Date Value Ref Range Status  11/05/2021 13 0 - 44 U/L Final         Passed - Valid encounter within last 12 months    Recent Outpatient Visits          4 months ago Chronic obstructive pulmonary disease, unspecified COPD type ([place])   [place] Pickard, Cammie Mcgee, MD   5 months ago COPD exacerbation ([place])   [place] [name], MD   5 months ago COPD exacerbation ([place])   [place] [name], MD   11 months ago COPD exacerbation ([place])   [place] [facility] Pickard, Cammie Mcgee, MD   1 year ago COPD exacerbation [facility])   [place], Cammie Mcgee, MD       Future Appointments            In 1 week Byrum, Rose Fillers, MD [facility]   In 1 month [name], Wallis Bamberg, MD [place] [facility] [place]. [facility], [facility]

## 2022-04-30 ENCOUNTER — Other Ambulatory Visit: Payer: Self-pay | Admitting: Family Medicine

## 2022-04-30 DIAGNOSIS — I4891 Unspecified atrial fibrillation: Secondary | ICD-10-CM

## 2022-05-05 ENCOUNTER — Other Ambulatory Visit: Payer: Self-pay | Admitting: Family Medicine

## 2022-05-05 NOTE — Telephone Encounter (Signed)
Requested medications are due for refill today.  no  Requested medications are on the active medications list.  yes  Last refill. 04/14/2022 #90 3 refills  Future visit scheduled.   no  Notes to clinic.  Refill not delegated.    Requested Prescriptions  Pending Prescriptions Disp Refills   QUEtiapine (SEROQUEL) 25 MG tablet 90 tablet 3    Sig: Take 1 tablet (25 mg total) by mouth at bedtime.     Not Delegated - Psychiatry:  Antipsychotics - Second Generation (Atypical) - quetiapine Failed - 05/05/2022 10:51 AM      Failed - This refill cannot be delegated      Failed - TSH in normal range and within 360 days    TSH  Date Value Ref Range Status  03/12/2021 0.904 0.350 - 4.500 uIU/mL Final    Comment:    Performed by a 3rd Generation assay with a functional sensitivity of <=0.01 uIU/mL. Performed at [facility], [address] [place], [place] [address]          Failed - Last BP in normal range    BP Readings from Last 1 Encounters:  04/03/22 (!) 146/72         Failed - Lipid Panel in normal range within the last 12 months    Cholesterol  Date Value Ref Range Status  03/12/2021 154 0 - 200 mg/dL Final   LDL Cholesterol (Calc)  Date Value Ref Range Status  11/06/2019 60 mg/dL (calc) Final    Comment:    Reference range: <100 . Desirable range <100 mg/dL for primary prevention;   <70 mg/dL for patients with CHD or diabetic patients  with > or = 2 CHD risk factors. Marland Kitchen LDL-C is now calculated using the Martin-Hopkins  calculation, which is a validated novel method providing  better accuracy than the Friedewald equation in the  estimation of LDL-C.  Cresenciano Genre et al. Annamaria Helling. 0258;527(78): 2061-2068  (http://education.[employer].com/faq/FAQ164)    LDL Cholesterol  Date Value Ref Range Status  03/12/2021 92 0 - 99 mg/dL Final    Comment:           Total Cholesterol/HDL:CHD Risk Coronary Heart Disease Risk Table                     Men   Women  1/2 Average  Risk   3.4   3.3  Average Risk       5.0   4.4  2 X Average Risk   9.6   7.1  3 X Average Risk  23.4   11.0        Use the calculated Patient Ratio above and the CHD Risk Table to determine the patient's CHD Risk.        ATP III CLASSIFICATION (LDL):  <100     mg/dL   Optimal  100-129  mg/dL   Near or Above                    Optimal  130-159  mg/dL   Borderline  160-189  mg/dL   High  >190     mg/dL   Very High Performed at [address] [place], [place] [address]    HDL  Date Value Ref Range Status  03/12/2021 55 >40 mg/dL Final   Triglycerides  Date Value Ref Range Status  03/12/2021 35 <150 mg/dL Final         Passed - Completed PHQ-2 or PHQ-9  in the last 360 days      Passed - Last Heart Rate in normal range    Pulse Readings from Last 1 Encounters:  04/03/22 87         Passed - Valid encounter within last 6 months    Recent Outpatient Visits           4 months ago Chronic obstructive pulmonary disease, unspecified COPD type ([place])   [place] [name], MD   5 months ago COPD exacerbation ([place])   [place] [name], MD   6 months ago COPD exacerbation [facility])   [place] [name], MD   11 months ago COPD exacerbation [facility])   [place] [facility] Pickard, Cammie Mcgee, MD   1 year ago COPD exacerbation [facility])   [place] Pickard, Cammie Mcgee, MD       Future Appointments             In 2 days Byrum, Rose Fillers, MD [place] [facility]   In 1 month [name], Wallis Bamberg, MD [place] [facility], [facility]            Passed - CBC within normal limits and completed in the last 12 months    WBC  Date Value Ref Range Status  11/05/2021 5.2 4.0 - 10.5 K/uL Final   RBC  Date Value Ref Range Status  11/05/2021 5.26 4.22 - 5.81 MIL/uL Final   Hemoglobin  Date Value Ref Range Status   11/05/2021 12.1 (L) 13.0 - 17.0 g/dL Final   HCT  Date Value Ref Range Status  11/05/2021 39.4 39.0 - 52.0 % Final   MCHC  Date Value Ref Range Status  11/05/2021 30.7 30.0 - 36.0 g/dL Final   [facility]  Date Value Ref Range Status  11/05/2021 23.0 (L) 26.0 - 34.0 pg Final   MCV  Date Value Ref Range Status  11/05/2021 74.9 (L) 80.0 - 100.0 fL Final   No results found for: "PLTCOUNTKUC", "LABPLAT", "POCPLA" RDW  Date Value Ref Range Status  11/05/2021 18.3 (H) 11.5 - 15.5 % Final         Passed - CMP within normal limits and completed in the last 12 months    Albumin  Date Value Ref Range Status  11/05/2021 3.1 (L) 3.5 - 5.0 g/dL Final   Albumin ELP  Date Value Ref Range Status  05/27/2018 4.2 2.9 - 4.4 g/dL Final   Alkaline Phosphatase  Date Value Ref Range Status  11/05/2021 78 38 - 126 U/L Final   Alkaline phosphatase (APISO)  Date Value Ref Range Status  05/26/2021 142 35 - 144 U/L Final   ALT  Date Value Ref Range Status  11/05/2021 13 0 - 44 U/L Final   AST  Date Value Ref Range Status  11/05/2021 31 15 - 41 U/L Final   BUN  Date Value Ref Range Status  11/06/2021 14 8 - 23 mg/dL Final   Calcium  Date Value Ref Range Status  11/06/2021 8.9 8.9 - 10.3 mg/dL Final   Calcium, Ion  Date Value Ref Range Status  11/04/2021 1.16 1.15 - 1.40 mmol/L Final   CO2  Date Value Ref Range Status  11/06/2021 26 22 - 32 mmol/L Final   Bicarbonate  Date Value Ref Range Status  11/04/2021 33.1 (H) 20.0 - 28.0 mmol/L Final   TCO2  Date Value Ref Range Status  11/04/2021 35 (H) 22 - 32 mmol/L Final   Creat  Date Value Ref Range Status  05/26/2021 1.00 0.70 - 1.28 mg/dL Final   Creatinine, Ser  Date Value Ref Range Status  11/06/2021 1.00 0.61 - 1.24 mg/dL Final   Glucose, Bld  Date Value Ref Range Status  11/06/2021 157 (H) 70 - 99 mg/dL Final    Comment:    Glucose reference range applies only to samples taken after fasting for at least 8 hours.    Glucose-Capillary  Date Value Ref Range Status  03/11/2021 105 (H) 70 - 99 mg/dL Final    Comment:    Glucose reference range applies only to samples taken after fasting for at least 8 hours.   Potassium  Date Value Ref Range Status  11/06/2021 3.9 3.5 - 5.1 mmol/L Final   Sodium  Date Value Ref Range Status  11/06/2021 133 (L) 135 - 145 mmol/L Final   Total Bilirubin  Date Value Ref Range Status  11/05/2021 0.8 0.3 - 1.2 mg/dL Final   Bilirubin, Direct  Date Value Ref Range Status  04/19/2021 0.1 0.0 - 0.2 mg/dL Final   Indirect Bilirubin  Date Value Ref Range Status  04/19/2021 0.3 0.3 - 0.9 mg/dL Final    Comment:    Performed at [facility], [address], [place], [place] [address]   Protein, ur  Date Value Ref Range Status  03/12/2021 100 (A) NEGATIVE mg/dL Final   Total Protein  Date Value Ref Range Status  11/05/2021 5.9 (L) 6.5 - 8.1 g/dL Final   Total Protein ELP  Date Value Ref Range Status  05/27/2018 7.1 6.0 - 8.5 g/dL Final   GFR, Est African American  Date Value Ref Range Status  08/05/2018 105 > OR = 60 mL/min/1.81m Final   GFR calc Af Amer  Date Value Ref Range Status  03/08/2020 >60 >60 mL/min Final   GFR  Date Value Ref Range Status  01/28/2012 108.57 >60.00 mL/min Final   eGFR  Date Value Ref Range Status  05/26/2021 80 > OR = 60 mL/min/1.757mFinal    Comment:    The eGFR is based on the CKD-EPI 2021 equation. To calculate  the new eGFR from a previous Creatinine or Cystatin C result, go to https://www.kidney.org/professionals/ kdoqi/gfr%5Fcalculator    GFR, Est Non African American  Date Value Ref Range Status  08/05/2018 90 > OR = 60 mL/min/1.7313minal   GFR, Estimated  Date Value Ref Range Status  11/06/2021 >60 >60 mL/min Final    Comment:    (NOTE) Calculated using the CKD-EPI Creatinine Equation (2021)           b

## 2022-05-05 NOTE — Telephone Encounter (Signed)
Received eFax from pharmacy to request refill/new script for  QUEtiapine (SEROQUEL) 25 MG tablet   Pharmacy:  [facility] [place], [address]  [place], [place] [address]  Phone:  [phone]  Fax:  [phone]   LOV: 12/26/21  Please advise pharmacist at [phone].

## 2022-05-07 ENCOUNTER — Ambulatory Visit: Payer: Medicare HMO | Admitting: Emergency Medicine

## 2022-05-07 ENCOUNTER — Encounter: Payer: Self-pay | Admitting: Emergency Medicine

## 2022-05-07 VITALS — BP 136/74 | HR 96 | Temp 97.8°F | Wt 124.4 lb

## 2022-05-07 DIAGNOSIS — Z23 Encounter for immunization: Secondary | ICD-10-CM | POA: Diagnosis not present

## 2022-05-07 DIAGNOSIS — J449 Chronic obstructive pulmonary disease, unspecified: Secondary | ICD-10-CM

## 2022-05-07 MED ORDER — PREDNISONE 10 MG PO TABS: 20.0000 mg | ORAL_TABLET | Freq: Every day | ORAL | 0 refills | Status: DC

## 2022-05-07 MED ORDER — QUETIAPINE FUMARATE 25 MG PO TABS: 25.0000 mg | ORAL_TABLET | Freq: Every day | ORAL | 3 refills | Status: DC

## 2022-05-07 NOTE — Assessment & Plan Note (Signed)
Very severe disease.  Appears to be in stage.  There may be an opportunity for [place] to improve his daily symptoms by increasing his prednisone.  Suppose we could also consider daily azithromycin although he does not have a high mucus burden this may also help his appetite.  Unclear how much benefit he will get.  I believe he is headed for palliative care/hospice.  I broached the subject about this today, and we will likely need to revisit.  No referral for now.  Please continue to wear your oxygen at 2.5-4 L/min depending on your level of exertion.  You need to wear your oxygen at all times. We will increase your prednisone to 20 mg daily. Please continue Breztri 2 puffs twice a day. Use your albuterol either 1 nebulizer treatment or 2 puffs when you needed for shortness of breath, chest tightness, wheezing. Flu shot today. Follow with APP in 4 to 6 weeks to assess your progress on the increased dose of prednisone Follow with Dr Lamonte Sakai in 6 months or sooner if you have any problems

## 2022-05-07 NOTE — Addendum Note (Signed)
Addended by: Gavin Potters R on: 05/07/2022 04:45 PM   Modules accepted: Orders

## 2022-05-07 NOTE — Patient Instructions (Signed)
Please continue to wear your oxygen at 2.5-4 L/min depending on your level of exertion.  You need to wear your oxygen at all times. We will increase your prednisone to 20 mg daily. Please continue Breztri 2 puffs twice a day. Use your albuterol either 1 nebulizer treatment or 2 puffs when you needed for shortness of breath, chest tightness, wheezing. Flu shot today. Follow with APP in 4 to 6 weeks to assess your progress on the increased dose of prednisone Follow with Dr Lamonte Sakai in 6 months or sooner if you have any problems

## 2022-05-07 NOTE — Progress Notes (Signed)
Subjective:    Patient ID: Zachary Chambers, male    DOB: 08-14-1951, 71 y.o.   MRN: [id number]  COPD He complains of cough, shortness of breath and wheezing. Pertinent negatives include no ear pain, fever, headaches, postnasal drip, rhinorrhea, sneezing, sore throat or trouble swallowing. His past medical history is significant for COPD.    ROV 08/25/18 --71 year old gentleman with continued tobacco use, 1 pack daily, COPD.  We have been managing him on Symbicort and Spiriva.  He reports stable exertional dyspnea, he has to go slower. He is able to ADL, shopping. He has more trouble in extreme cold or heat. Minimal cough, occasional wheeze. He uses albuterol about 3-4x a day. No flares since last time. 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  He quit smoking almost 2 years ago.  He is on oxygen at 2.5 - 4 L/min.  Chronic prednisone at 5 mg daily.  Currently managed on Breztri.  Has albuterol HFA and nebulizer available, uses about 2x a day.  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 Unable to get around or do any ADL. Some cough but less mucous burden.  Needs flu shot today.        No data to display          Review of Systems  Constitutional:  Negative for fever and unexpected weight change.  HENT:  Negative for congestion, dental problem, ear pain, nosebleeds, postnasal drip, rhinorrhea, sinus pressure, sneezing, sore throat and trouble swallowing.   Eyes:  Negative for redness and itching.  Respiratory:  Positive for cough, shortness of breath and wheezing. Negative for chest tightness.   Cardiovascular:  Negative for palpitations and leg swelling.  Gastrointestinal:  Negative for nausea and vomiting.  Genitourinary:  Negative for dysuria.  Musculoskeletal:  Negative for joint swelling.  Skin:  Negative for rash.  Neurological:   Negative for headaches.  Hematological:  Bruises/bleeds easily.  Psychiatric/Behavioral:  Negative for dysphoric mood. The patient is not nervous/anxious.     No Known Allergies   Outpatient Medications Prior to Visit  Medication Sig Dispense Refill   albuterol (PROVENTIL) (2.5 MG/3ML) 0.083% nebulizer solution Take 3 mLs (2.5 mg total) by nebulization every 4 (four) hours as needed for wheezing or shortness of breath. 180 mL 4   albuterol (VENTOLIN HFA) 108 (90 Base) MCG/ACT inhaler Inhale 2 puffs into the lungs every 6 (six) hours as needed for wheezing or shortness of breath. 18 g 5   aspirin 81 MG tablet Take 81 mg by mouth at bedtime.     atorvastatin (LIPITOR) 40 MG tablet Take 1 tablet (40 mg total) by mouth at bedtime. 90 tablet 3   benzonatate (TESSALON PERLES) 100 MG capsule Take 1 capsule (100 mg total) by mouth 3 (three) times daily as needed for cough. 60 capsule 11   Blood Glucose Calibration (TRUE METRIX LEVEL 1) Low SOLN USE AS DIRECTED. To monitor fsbs 2 times per day dx e11.9 1 each 3   Blood Glucose Monitoring Suppl (TRUE METRIX AIR GLUCOSE METER) DEVI USE AS DIRECTED. To monitor fsbs 2 times per day dx e11.9 1 each 3   BREZTRI AEROSPHERE 160-9-4.8 MCG/ACT AERO INHALE 2 PUFFS into lungs 2 TIMES DAILY 32.1 g 3   Cholecalciferol (D3 PO) Take 1 tablet by mouth daily.     clotrimazole-betamethasone (LOTRISONE) cream  apply TO SKIN 2 TIMES DAILY 30 g 1   Cyanocobalamin (VITAMIN B-12 PO) Take 1 tablet by mouth daily.     ELIQUIS 5 MG TABS tablet TAKE 1 TABLET BY MOUTH 2 TIMES DAILY 60 tablet 3   fluticasone (FLONASE) 50 MCG/ACT nasal spray Place 2 sprays into both nostrils daily. 48 g 3   glucose blood (RELION TRUE METRIX TEST STRIPS) test strip USE AS DIRECTED. To monitor fsbs 2 times per day dx e11.9 100 each 3   guaiFENesin (MUCINEX) 600 MG 12 hr tablet Take 1 tablet (600 mg total) by mouth 2 (two) times daily.     isosorbide mononitrate (IMDUR) 30 MG 24 hr tablet Take 0.5  tablets (15 mg total) by mouth daily. 45 tablet 3   LORazepam (ATIVAN) 0.5 MG tablet TAKE 1 TABLET TWICE DAILY AS NEEDED FOR ANXIETY 60 tablet 0   magnesium oxide (MAG-OX) 400 (240 Mg) MG tablet Take 1 tablet (400 mg total) by mouth daily. 100 tablet 0   metoprolol tartrate (LOPRESSOR) 50 MG tablet Take 1 tablet (50 mg total) by mouth 2 (two) times daily. 60 tablet 3   Multiple Vitamin (MULTIVITAMIN WITH MINERALS) TABS tablet Take 1 tablet by mouth daily. 90 tablet 0   nitroGLYCERIN (NITROSTAT) 0.4 MG SL tablet DISSOLVE 1 TABLET UNDER THE TONGUE EVERY 5 MINUTES AS NEEDED FOR CHEST PAIN 25 tablet 0   omeprazole (PRILOSEC) 40 MG capsule Take 1 capsule (40 mg total) by mouth daily. 90 capsule 3   predniSONE (DELTASONE) 5 MG tablet Take 1 tablet (5 mg total) by mouth daily with breakfast. 90 tablet 3   QUEtiapine (SEROQUEL) 25 MG tablet Take 1 tablet (25 mg total) by mouth at bedtime. 90 tablet 3   No facility-administered medications prior to visit.         Objective:   Physical Exam Vitals:   05/07/22 1550  BP: 136/74  Pulse: 96  Temp: 97.8 F (36.6 C)  TempSrc: Oral  SpO2: 91%  Weight: 124 lb 6.4 oz (56.4 kg)   Gen: Pleasant, cachectic elderly man, in no distress,  normal affect  ENT: No lesions,  mouth clear,  oropharynx clear, no postnasal drip  Neck: No JVD, no stridor  Lungs: No use of accessory muscles, very distant, end expiratory wheezes  Cardiovascular: RRR, heart sounds normal, no murmur or gallops, no peripheral edema  Musculoskeletal: No deformities, no cyanosis or clubbing  Neuro: alert, non focal  Skin: Warm, scattered ecchymoses, no rash      Assessment & Plan:  COPD (chronic obstructive pulmonary disease) (HCC) Very severe disease.  Appears to be in stage.  There may be an opportunity for [place] to improve his daily symptoms by increasing his prednisone.  Suppose we could also consider daily azithromycin although he does not have a high mucus burden this may  also help his appetite.  Unclear how much benefit he will get.  I believe he is headed for palliative care/hospice.  I broached the subject about this today, and we will likely need to revisit.  No referral for now.  Please continue to wear your oxygen at 2.5-4 L/min depending on your level of exertion.  You need to wear your oxygen at all times. We will increase your prednisone to 20 mg daily. Please continue Breztri 2 puffs twice a day. Use your albuterol either 1 nebulizer treatment or 2 puffs when you needed for shortness of breath, chest tightness, wheezing. Flu shot today. Follow with APP in 4  to 6 weeks to assess your progress on the increased dose of prednisone Follow with Dr Lamonte Sakai in 6 months or sooner if you have any problems  Baltazar Apo, MD, PhD 05/07/2022, 4:07 PM [facility] [phone] or if no answer [phone]

## 2022-05-27 ENCOUNTER — Other Ambulatory Visit: Payer: Self-pay | Admitting: Family Medicine

## 2022-06-06 ENCOUNTER — Other Ambulatory Visit: Payer: Self-pay

## 2022-06-06 ENCOUNTER — Emergency Department (HOSPITAL_COMMUNITY)
Admission: EM | Admit: 2022-06-06 | Discharge: 2022-06-06 | Disposition: A | Discharge status: Home / Self Care | Payer: Medicare HMO | Attending: Emergency Medicine | Admitting: Emergency Medicine

## 2022-06-06 ENCOUNTER — Encounter (HOSPITAL_COMMUNITY): Payer: Self-pay | Admitting: Emergency Medicine

## 2022-06-06 ENCOUNTER — Emergency Department (HOSPITAL_COMMUNITY): Payer: Medicare HMO

## 2022-06-06 DIAGNOSIS — R062 Wheezing: Secondary | ICD-10-CM | POA: Diagnosis not present

## 2022-06-06 DIAGNOSIS — Z7401 Bed confinement status: Secondary | ICD-10-CM | POA: Diagnosis not present

## 2022-06-06 DIAGNOSIS — Z7901 Long term (current) use of anticoagulants: Secondary | ICD-10-CM | POA: Insufficient documentation

## 2022-06-06 DIAGNOSIS — R0602 Shortness of breath: Secondary | ICD-10-CM | POA: Diagnosis not present

## 2022-06-06 DIAGNOSIS — R0689 Other abnormalities of breathing: Secondary | ICD-10-CM | POA: Diagnosis not present

## 2022-06-06 DIAGNOSIS — Z7982 Long term (current) use of aspirin: Secondary | ICD-10-CM | POA: Diagnosis not present

## 2022-06-06 DIAGNOSIS — I10 Essential (primary) hypertension: Secondary | ICD-10-CM | POA: Diagnosis not present

## 2022-06-06 DIAGNOSIS — J441 Chronic obstructive pulmonary disease with (acute) exacerbation: Secondary | ICD-10-CM | POA: Insufficient documentation

## 2022-06-06 DIAGNOSIS — J449 Chronic obstructive pulmonary disease, unspecified: Secondary | ICD-10-CM | POA: Diagnosis not present

## 2022-06-06 DIAGNOSIS — J439 Emphysema, unspecified: Secondary | ICD-10-CM | POA: Diagnosis not present

## 2022-06-06 DIAGNOSIS — Z7951 Long term (current) use of inhaled steroids: Secondary | ICD-10-CM | POA: Diagnosis not present

## 2022-06-06 DIAGNOSIS — J80 Acute respiratory distress syndrome: Secondary | ICD-10-CM | POA: Diagnosis not present

## 2022-06-06 LAB — TROPONIN I (HIGH SENSITIVITY): Troponin I (High Sensitivity): 16 ng/L (ref <18)

## 2022-06-06 LAB — BRAIN NATRIURETIC PEPTIDE: B Natriuretic Peptide: 84.3 pg/mL (ref 0.0–100.0)

## 2022-06-06 LAB — BASIC METABOLIC PANEL
Anion gap: 12 (ref 5–15)
BUN: 29 mg/dL — ABNORMAL HIGH (ref 8–23)
CO2: 29 mmol/L (ref 22–32)
Calcium: 10 mg/dL (ref 8.9–10.3)
Chloride: 98 mmol/L (ref 98–111)
Creatinine, Ser: 1.11 mg/dL (ref 0.61–1.24)
GFR, Estimated: >60 mL/min (ref >60)
Glucose, Bld: 135 mg/dL — ABNORMAL HIGH (ref 70–99)
Potassium: 4.3 mmol/L (ref 3.5–5.1)
Sodium: 139 mmol/L (ref 135–145)

## 2022-06-06 LAB — CBC WITH DIFFERENTIAL/PLATELET
Abs Immature Granulocytes: 0.41 10*3/uL — ABNORMAL HIGH (ref 0.00–0.07)
Basophils Absolute: 0.1 10*3/uL (ref 0.0–0.1)
Basophils Relative: 1 %
Eosinophils Absolute: 0.1 10*3/uL (ref 0.0–0.5)
Eosinophils Relative: 1 %
HCT: 48.7 % (ref 39.0–52.0)
Hemoglobin: 14.7 g/dL (ref 13.0–17.0)
Immature Granulocytes: 2 %
Lymphocytes Relative: 5 %
Lymphs Abs: 1 10*3/uL (ref 0.7–4.0)
MCH: 25.1 pg — ABNORMAL LOW (ref 26.0–34.0)
MCHC: 30.2 g/dL (ref 30.0–36.0)
MCV: 83.2 fL (ref 80.0–100.0)
Monocytes Absolute: 0.5 10*3/uL (ref 0.1–1.0)
Monocytes Relative: 3 %
Neutro Abs: 17.1 10*3/uL — ABNORMAL HIGH (ref 1.7–7.7)
Neutrophils Relative %: 88 %
Platelets: 557 10*3/uL — ABNORMAL HIGH (ref 150–400)
RBC: 5.85 MIL/uL — ABNORMAL HIGH (ref 4.22–5.81)
RDW: 19.1 % — ABNORMAL HIGH (ref 11.5–15.5)
WBC: 19.3 10*3/uL — ABNORMAL HIGH (ref 4.0–10.5)
nRBC: 0 % (ref 0.0–0.2)

## 2022-06-06 MED ORDER — PREDNISONE 20 MG PO TABS: ORAL_TABLET | ORAL | 0 refills | Status: DC

## 2022-06-06 MED ORDER — IPRATROPIUM-ALBUTEROL 0.5-2.5 (3) MG/3ML IN SOLN
3.0000 mL | RESPIRATORY_TRACT | Status: AC
  Administered 2022-06-06 (×3): 3 mL via RESPIRATORY_TRACT
  Filled 2022-06-06: qty 9

## 2022-06-06 NOTE — ED Notes (Signed)
[facility] notified for patient's transport back home .

## 2022-06-06 NOTE — ED Triage Notes (Signed)
Patient arrived with EMS from home reports worsening SOB this evening with chest congestion / wheezing /rales and cough , he received Albuterol/Duoneb by EMS prior arrival with Magnesium 2 grams / Solumedrol 125 mg and Epinephrine 1.3 mg IM prior to arrival . History of COPD .

## 2022-06-06 NOTE — Discharge Instructions (Signed)
You can use your nebulizer every 4 hours while you are awake at least for the next couple days.  Please return for worsening symptoms.  Please let your pulmonologist know about your visit here to the emergency department.

## 2022-06-06 NOTE — ED Provider Notes (Signed)
[facility] EMERGENCY DEPARTMENT Provider Note   CSN: [id number] Arrival date & time: 06/06/22  0147     History  Chief Complaint  Patient presents with   Shortness of Breath    COPD    Zachary Chambers is a 71 y.o. male.  71 yo M with a cc of sob.  Started today.  Tried not taking all his medicines, wife tried to turn his O2 up to 10, but didn't work.  Called EMS.  Zachary Chambers feels much better after a DuoNeb Solu-Medrol magnesium and epinephrine.  Zachary Chambers denies any symptoms yesterday.   Shortness of Breath      Home Medications Prior to Admission medications   Medication Sig Start Date End Date Taking? Authorizing Provider  predniSONE (DELTASONE) 20 MG tablet 2 tabs po daily x 4 days 06/06/22  Yes Deno Etienne, DO  albuterol (PROVENTIL) (2.5 MG/3ML) 0.083% nebulizer solution Take 3 mLs (2.5 mg total) by nebulization every 4 (four) hours as needed for wheezing or shortness of breath. 11/08/21   Danford, Suann Larry, MD  albuterol (VENTOLIN HFA) 108 (90 Base) MCG/ACT inhaler Inhale 2 puffs into the lungs every 6 (six) hours as needed for wheezing or shortness of breath. 09/03/21   [name], MD  aspirin 81 MG tablet Take 81 mg by mouth at bedtime.    [provider]  atorvastatin (LIPITOR) 40 MG tablet Take 1 tablet (40 mg total) by mouth at bedtime. 11/25/21   [name], MD  benzonatate (TESSALON PERLES) 100 MG capsule Take 1 capsule (100 mg total) by mouth 3 (three) times daily as needed for cough. 11/20/21   [name], MD  Blood Glucose Calibration (TRUE METRIX LEVEL 1) Low SOLN USE AS DIRECTED. To monitor fsbs 2 times per day dx e11.9 06/02/21   [name], MD  Blood Glucose Monitoring Suppl (TRUE METRIX AIR GLUCOSE METER) DEVI USE AS DIRECTED. To monitor fsbs 2 times per day dx e11.9 06/02/21   [name], MD  BREZTRI AEROSPHERE 160-9-4.8 MCG/ACT AERO INHALE 2 PUFFS into lungs 2 TIMES DAILY 04/28/22   [name], MD   Cholecalciferol (D3 PO) Take 1 tablet by mouth daily.    [provider]  clotrimazole-betamethasone (LOTRISONE) cream apply TO SKIN 2 TIMES DAILY 03/05/22   [name], MD  Cyanocobalamin (VITAMIN B-12 PO) Take 1 tablet by mouth daily.    [provider]  ELIQUIS 5 MG TABS tablet TAKE 1 TABLET BY MOUTH 2 TIMES DAILY 04/30/22   [name], MD  fluticasone [facility]) 50 MCG/ACT nasal spray Place 2 sprays into both nostrils daily. 05/21/21   [name], MD  glucose blood (RELION TRUE METRIX TEST STRIPS) test strip USE AS DIRECTED. To monitor fsbs 2 times per day dx e11.9 06/02/21   [name], MD  guaiFENesin (MUCINEX) 600 MG 12 hr tablet Take 1 tablet (600 mg total) by mouth 2 (two) times daily. 11/08/21   Danford, Suann Larry, MD  isosorbide mononitrate (IMDUR) 30 MG 24 hr tablet Take 0.5 tablets (15 mg total) by mouth daily. 06/02/21   [name], MD  LORazepam (ATIVAN) 0.5 MG tablet TAKE 1 TABLET TWICE DAILY AS NEEDED FOR ANXIETY 05/01/22   [name], MD  magnesium oxide (MAG-OX) 400 (240 Mg) MG tablet Take 1 tablet (400 mg total) by mouth daily. 03/16/21   Pokhrel, Corrie Mckusick, MD  metoprolol tartrate (LOPRESSOR) 50 MG tablet Take 1 tablet (50  mg total) by mouth 2 (two) times daily. 11/08/21   Danford, Suann Larry, MD  Multiple Vitamin (MULTIVITAMIN WITH MINERALS) TABS tablet Take 1 tablet by mouth daily. 03/09/20   Kayleen Memos, DO  nitroGLYCERIN (NITROSTAT) 0.4 MG SL tablet DISSOLVE 1 TABLET UNDER THE TONGUE EVERY 5 MINUTES AS NEEDED FOR CHEST PAIN 04/30/22   [name], MD  omeprazole (PRILOSEC) 40 MG capsule Take 1 capsule (40 mg total) by mouth daily. 03/19/21   [name], MD  predniSONE (DELTASONE) 5 MG tablet Take 1 tablet (5 mg total) by mouth daily with breakfast. 12/26/21   [name], MD  QUEtiapine (SEROQUEL) 25 MG tablet Take 1 tablet (25 mg total) by mouth at bedtime. 05/07/22   [name], MD       Allergies    Patient has no known allergies.    Review of Systems   Review of Systems  Respiratory:  Positive for shortness of breath.     Physical Exam Updated Vital Signs BP 101/67   Pulse (!) 103   Temp 98 F (36.7 C) (Oral)   Resp 16   SpO2 96%  Physical Exam Vitals and nursing note reviewed.  Constitutional:      Appearance: Zachary Chambers is well-developed.  HENT:     Head: Normocephalic and atraumatic.  Eyes:     Pupils: Pupils are equal, round, and reactive to light.  Neck:     Vascular: No JVD.  Cardiovascular:     Rate and Rhythm: Normal rate and regular rhythm.     Heart sounds: No murmur heard.    No friction rub. No gallop.  Pulmonary:     Effort: No respiratory distress.     Breath sounds: No wheezing.     Comments: Barrel chested.  Diminished breath sounds in all fields.  End expiratory wheezing. Abdominal:     General: There is no distension.     Tenderness: There is no abdominal tenderness. There is no guarding or rebound.  Musculoskeletal:        General: Normal range of motion.     Cervical back: Normal range of motion and neck supple.  Skin:    Coloration: Skin is not pale.     Findings: No rash.  Neurological:     Mental Status: Zachary Chambers is alert and oriented to person, place, and time.  Psychiatric:        Behavior: Behavior normal.     ED Results / Procedures / Treatments   Labs (all labs ordered are listed, but only abnormal results are displayed) Labs Reviewed  CBC WITH DIFFERENTIAL/PLATELET - Abnormal; Notable for the following components:      Result Value   WBC 19.3 (*)    RBC 5.85 (*)    MCH 25.1 (*)    RDW 19.1 (*)    Platelets 557 (*)    Neutro Abs 17.1 (*)    Abs Immature Granulocytes 0.41 (*)    All other components within normal limits  BASIC METABOLIC PANEL - Abnormal; Notable for the following components:   Glucose, Bld 135 (*)    BUN 29 (*)    All other components within normal limits  BRAIN NATRIURETIC PEPTIDE  TROPONIN I  (HIGH SENSITIVITY)    EKG None  Radiology DG Chest Port 1 View  Result Date: 06/06/2022 CLINICAL DATA:  Shortness of breath. EXAM: PORTABLE CHEST 1 VIEW COMPARISON:  Portable chest 11/04/2021 FINDINGS: There is chronic blunting in the lateral sulci. Heart  size and vasculature are normal with stable mediastinum and coronary artery stents on the left. 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 No focal pneumonia is evident. Thoracic cage is intact with osteopenia. IMPRESSION: No acute radiographic chest findings. Stable COPD chest with chronic change. Electronically Signed   By: Telford Nab M.D.   On: 06/06/2022 02:52    Procedures Procedures    Medications Ordered in ED Medications  ipratropium-albuterol (DUONEB) 0.5-2.5 (3) MG/3ML nebulizer solution 3 mL (3 mLs Nebulization Given 06/06/22 0231)    ED Course/ Medical Decision Making/ A&P                           Medical Decision Making Amount and/or Complexity of Data Reviewed Labs: ordered. Radiology: ordered. ECG/medicine tests: ordered.  Risk Prescription drug management.   71 yo F with a significant past medical history of COPD.  Per of the pulmonology note Zachary Chambers has severe COPD.  Zachary Chambers is chronically on oxygen 2 and half to 4 L.  Zachary Chambers is chronically on prednisone.  Zachary Chambers was reportedly in respiratory distress with EMS arrival.  Has improved significantly per EMS and the patient.  Still has diminished breath sounds on exam.  We will give 3 DuoNebs back-to-back chest x-ray reassess.   Patient is feeling a bit better on reassessment.  Continues to be on his home O2.  Chest x-ray independently interpreted by me without obvious focal infiltrate or pneumothorax.  Lab work without anemia, no significant electrolyte abnormality.  Troponin negative.  Mild leukocytosis.  Could be due to steroid administration.  5:19 AM:  I have discussed the diagnosis/risks/treatment options  with the patient.  Evaluation and diagnostic testing in the emergency department does not suggest an emergent condition requiring admission or immediate intervention beyond what has been performed at this time.  They will follow up with PCP. We also discussed returning to the ED immediately if new or worsening sx occur. We discussed the sx which are most concerning (e.g., sudden worsening pain, fever, inability to tolerate by mouth) that necessitate immediate return. Medications administered to the patient during their visit and any new prescriptions provided to the patient are listed below.  Medications given during this visit Medications  ipratropium-albuterol (DUONEB) 0.5-2.5 (3) MG/3ML nebulizer solution 3 mL (3 mLs Nebulization Given 06/06/22 0231)     The patient appears reasonably screen and/or stabilized for discharge and I doubt any other medical condition or other [facility] requiring further screening, evaluation, or treatment in the ED at this time prior to discharge.         Final Clinical Impression(s) / ED Diagnoses Final diagnoses:  COPD exacerbation ([place])    Rx / DC Orders ED Discharge Orders          Ordered    predniSONE (DELTASONE) 20 MG tablet        06/06/22 Earle, [place], DO 06/06/22 [phone]

## 2022-06-08 ENCOUNTER — Other Ambulatory Visit: Payer: Self-pay | Admitting: Family Medicine

## 2022-06-08 ENCOUNTER — Other Ambulatory Visit: Payer: Self-pay | Admitting: Emergency Medicine

## 2022-06-09 ENCOUNTER — Other Ambulatory Visit: Payer: Self-pay | Admitting: Emergency Medicine

## 2022-06-18 NOTE — Progress Notes (Deleted)
CARDIOLOGY CONSULT NOTE     Primary Physician: [name], MD Primary Cardiologist: [name]  F/U CAD  HPI:  71 y.o. I have note seen in a while.Most recently seen by Dr Harl Bowie and PA;s   History of CAD with PCI circumflex and then LAD last 2003 Has COPD still smoking Has vascular disease with diagnosis of AAA but diameter < 3.0 on duplex 2018 Presented with SSCP He r/o with no acute ECG changes Myovue 03/08/20 no ischemia low risk study EF 63% d/c home   He follows with [facility] Quit smoking 06/2020 after COVID pneumonia d/c on 2L oxygen He is very inactive and in wheel chair mostly Severe COPD F/F ratio 45% DLCO 47% Needs up to 5L with activity   History of CVA 08/29/20 seen by neurology on low dose eliquis  Carotids plaque no stenosis 08/19/18  CTA head 10/04/20 no vascular stenosis old left frontoparietal infarct He does have a history of PAF CHADVASC 5  TTE 03/12/21 EF 65-70% Normal RV AV sclerosis and small pericardial effusion   Given his lung dx, poor functional status and co morbidities he is not thought to be a great repeat cath candidate unless he presented with acute MI> There is also a history of ETOH abuse with cognitive decline   ***   ROS All other systems reviewed and negative except as noted above  Past Medical History:  Diagnosis Date   ACS (acute coronary syndrome) ([place]) 04/19/2021   Alcohol abuse    Aortic atherosclerosis (HCC)    CAD in native artery    Chronic respiratory failure (HCC)    COPD (chronic obstructive pulmonary disease) (HCC)    DJD (degenerative joint disease)    diffusely   GERD (gastroesophageal reflux disease)    Hyperlipidemia    On home O2    Paroxysmal atrial fibrillation (HCC)    Pericardial effusion    Pneumonia due to COVID-19 virus    PUD (peptic ulcer disease)    with bleeding   Rotator cuff disorder    has been evaluated by Dr Clifton James and [facility]   Stroke (cerebrum) [facility])     Family History  Problem Relation  Age of Onset   Ovarian cancer Mother    Diabetes Maternal Grandfather    COPD Father    Diabetes Paternal Grandfather    Colon cancer Paternal Grandfather    Esophageal cancer Neg Hx    Rectal cancer Neg Hx    Stomach cancer Neg Hx     Social History   Socioeconomic History   Marital status: Single    Spouse name: Not on file   Number of children: Not on file   Years of education: Not on file   Highest education level: Not on file  Occupational History   Occupation: retired  Tobacco Use   Smoking status: Former    Packs/day: 1.00    Years: 40.00    Total pack years: 40.00    Types: Cigarettes   Smokeless tobacco: Never   Tobacco comments:    declines patch  Vaping Use   Vaping Use: Never used  Substance and Sexual Activity   Alcohol use: Yes    Alcohol/week: 30.0 standard drinks of alcohol    Types: 30 Cans of beer per week    Comment: h/o heavy use, now 4 beers per day   Drug use: Not Currently   Sexual activity: Yes  Other Topics Concern   Not on file  Social History  Narrative   Right Handed   Lives in one story home       Patient has not smoked for 3 months   Patient on 2L of Oxygen    Social Determinants of Health   Financial Resource Strain: Medium Risk (08/01/2021)   Overall Financial Resource Strain (CARDIA)    Difficulty of Paying Living Expenses: Somewhat hard  Food Insecurity: Food Insecurity Present (08/01/2021)   Hunger Vital Sign    Worried About Running Out of Food in the Last Year: Sometimes true    Ran Out of Food in the Last Year: Sometimes true  Transportation Needs: No Transportation Needs (08/01/2021)   PRAPARE - Hydrologist (Medical): No    Lack of Transportation (Non-Medical): No  Physical Activity: Insufficiently Active (08/01/2021)   Exercise Vital Sign    Days of Exercise per Week: 3 days    Minutes of Exercise per Session: 20 min  Stress: Stress Concern Present (08/01/2021)   [place]    Feeling of Stress : To some extent  Social Connections: Socially Integrated (08/01/2021)   Social Connection and Isolation Panel [NHANES]    Frequency of Communication with Friends and Family: More than three times a week    Frequency of Social Gatherings with Friends and Family: Once a week    Attends Religious Services: 1 to 4 times per year    Active Member of [employer] or Organizations: No    Attends Archivist Meetings: 1 to 4 times per year    Marital Status: Living with partner  Intimate Partner Violence: Not At Risk (08/01/2021)   Humiliation, Afraid, Rape, and Kick questionnaire    Fear of Current or Ex-Partner: No    Emotionally Abused: No    Physically Abused: No    Sexually Abused: No    Past Surgical History:  Procedure Laterality Date   COLONOSCOPY     EYE SURGERY     right eye removed at age 49   heart stent     x3- 2006      Current Outpatient Medications:    albuterol (PROVENTIL) (2.5 MG/3ML) 0.083% nebulizer solution, Take 3 mLs (2.5 mg total) by nebulization every 4 (four) hours as needed for wheezing or shortness of breath., Disp: 180 mL, Rfl: 4   albuterol (VENTOLIN HFA) 108 (90 Base) MCG/ACT inhaler, Inhale 2 puffs into the lungs every 6 (six) hours as needed for wheezing or shortness of breath., Disp: 18 g, Rfl: 5   aspirin 81 MG tablet, Take 81 mg by mouth at bedtime., Disp: , Rfl:    atorvastatin (LIPITOR) 40 MG tablet, Take 1 tablet (40 mg total) by mouth at bedtime., Disp: 90 tablet, Rfl: 3   benzonatate (TESSALON PERLES) 100 MG capsule, Take 1 capsule (100 mg total) by mouth 3 (three) times daily as needed for cough., Disp: 60 capsule, Rfl: 11   Blood Glucose Calibration (TRUE METRIX LEVEL 1) Low SOLN, USE AS DIRECTED. To monitor fsbs 2 times per day dx e11.9, Disp: 1 each, Rfl: 3   Blood Glucose Monitoring Suppl (TRUE METRIX AIR GLUCOSE METER) DEVI, USE AS DIRECTED. To monitor fsbs 2 times  per day dx e11.9, Disp: 1 each, Rfl: 3   BREZTRI AEROSPHERE 160-9-4.8 MCG/ACT AERO, INHALE 2 PUFFS into lungs 2 TIMES DAILY, Disp: 32.1 g, Rfl: 3   Cholecalciferol (D3 PO), Take 1 tablet by mouth daily., Disp: , Rfl:  clotrimazole-betamethasone (LOTRISONE) cream, APPLY TO SKIN 2 TIMES DAILY, Disp: 30 g, Rfl: 1   Cyanocobalamin (VITAMIN B-12 PO), Take 1 tablet by mouth daily., Disp: , Rfl:    ELIQUIS 5 MG TABS tablet, TAKE 1 TABLET BY MOUTH 2 TIMES DAILY, Disp: 60 tablet, Rfl: 3   fluticasone (FLONASE) 50 MCG/ACT nasal spray, Place 2 sprays into both nostrils daily., Disp: 48 g, Rfl: 3   glucose blood (RELION TRUE METRIX TEST STRIPS) test strip, USE AS DIRECTED. To monitor fsbs 2 times per day dx e11.9, Disp: 100 each, Rfl: 3   guaiFENesin (MUCINEX) 600 MG 12 hr tablet, Take 1 tablet (600 mg total) by mouth 2 (two) times daily., Disp: , Rfl:    isosorbide mononitrate (IMDUR) 30 MG 24 hr tablet, TAKE 1/2 TABLET BY MOUTH EVERY DAY, Disp: 45 tablet, Rfl: 3   LORazepam (ATIVAN) 0.5 MG tablet, TAKE 1 TABLET TWICE DAILY AS NEEDED FOR ANXIETY, Disp: 60 tablet, Rfl: 0   magnesium oxide (MAG-OX) 400 (240 Mg) MG tablet, Take 1 tablet (400 mg total) by mouth daily., Disp: 100 tablet, Rfl: 0   metoprolol tartrate (LOPRESSOR) 50 MG tablet, Take 1 tablet (50 mg total) by mouth 2 (two) times daily., Disp: 60 tablet, Rfl: 3   Multiple Vitamin (MULTIVITAMIN WITH MINERALS) TABS tablet, Take 1 tablet by mouth daily., Disp: 90 tablet, Rfl: 0   nitroGLYCERIN (NITROSTAT) 0.4 MG SL tablet, DISSOLVE 1 TABLET UNDER THE TONGUE EVERY 5 MINUTES AS NEEDED FOR CHEST PAIN, Disp: 25 tablet, Rfl: 0   omeprazole (PRILOSEC) 40 MG capsule, Take 1 capsule (40 mg total) by mouth daily., Disp: 90 capsule, Rfl: 3   predniSONE (DELTASONE) 10 MG tablet, TAKE 2 TABLETS BY MOUTH DAILY WITH BREAKFAST, Disp: 60 tablet, Rfl: 3   predniSONE (DELTASONE) 20 MG tablet, 2 tabs po daily x 4 days, Disp: 8 tablet, Rfl: 0   predniSONE (DELTASONE) 5 MG  tablet, Take 1 tablet (5 mg total) by mouth daily with breakfast., Disp: 90 tablet, Rfl: 3   QUEtiapine (SEROQUEL) 25 MG tablet, Take 1 tablet (25 mg total) by mouth at bedtime., Disp: 90 tablet, Rfl: 3    Physical Exam: There were no vitals taken for this visit.    Chronically ill  COPD exp wheezing on oxygen Distant heart sounds  Abdomen benign  Neuro non focal Palpable pedal pulses ***   Labs:   Lab Results  Component Value Date   WBC 19.3 (H) 06/06/2022   HGB 14.7 06/06/2022   HCT 48.7 06/06/2022   MCV 83.2 06/06/2022   PLT 557 (H) 06/06/2022   No results for input(s): "NA", "K", "CL", "CO2", "BUN", "CREATININE", "CALCIUM", "PROT", "BILITOT", "ALKPHOS", "ALT", "AST", "GLUCOSE" in the last 168 hours.  Invalid input(s): "LABALBU" No results found for: "CKTOTAL", "CKMB", "CKMBINDEX", "TROPONINI"  Lab Results  Component Value Date   CHOL 154 03/12/2021   CHOL 118 03/08/2020   CHOL 137 11/06/2019   Lab Results  Component Value Date   HDL 55 03/12/2021   HDL 49 03/08/2020   HDL 64 11/06/2019   Lab Results  Component Value Date   LDLCALC 92 03/12/2021   LDLCALC 61 03/08/2020   LDLCALC 60 11/06/2019   Lab Results  Component Value Date   TRIG 35 03/12/2021   TRIG 46 07/04/2020   TRIG 41 03/08/2020   Lab Results  Component Value Date   CHOLHDL 2.8 03/12/2021   CHOLHDL 2.4 03/08/2020   CHOLHDL 2.1 11/06/2019   No results found for: "  LDLDIRECT"    Radiology: [facility] Chest Port 1 View  Result Date: 06/06/2022 CLINICAL DATA:  Shortness of breath. EXAM: PORTABLE CHEST 1 VIEW COMPARISON:  Portable chest 11/04/2021 FINDINGS: There is chronic blunting in the lateral sulci. Heart size and vasculature are normal with stable mediastinum and coronary artery stents on the left. 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 No focal pneumonia is evident. Thoracic cage is intact with osteopenia. IMPRESSION: No acute  radiographic chest findings. Stable COPD chest with chronic change. Electronically Signed   By: Telford Nab M.D.   On: 06/06/2022 02:52    EKG: afib rapid rate 07/05/20    ASSESSMENT AND PLAN:   1. CAD:  Stable no angina distant PCI circumflex /LAD non ischemic myovue July 2021 continue medical Rx given co morbidities  2. COPD: chronic made worse by COVID pneumonia f/u [facility] continue oxygen nebs Stopped smoking 06/2020   3. PAF:  Not well documented on eliquis   4. CVA: f/u neurology likely related above carotids CTA neck normal   5. HLD:  On statin labs with primary   F/U in a year   Signed: [name] 06/18/2022, 4:32 PM

## 2022-06-19 ENCOUNTER — Ambulatory Visit: Payer: Medicare HMO | Admitting: Primary Care

## 2022-06-26 ENCOUNTER — Ambulatory Visit: Payer: Medicare HMO | Admitting: Cardiovascular Disease

## 2022-06-29 ENCOUNTER — Ambulatory Visit: Payer: Medicare HMO | Admitting: Primary Care

## 2022-06-29 NOTE — Progress Notes (Deleted)
$'@Patient'U$  ID: Zachary Chambers, male    DOB: 03-23-1951, 71 y.o.   MRN: [id number]  No chief complaint on file.   Referring provider: [name], MD  HPI:  COPD He complains of cough, shortness of breath and wheezing. Pertinent negatives include no ear pain, fever, headaches, postnasal drip, rhinorrhea, sneezing, sore throat or trouble swallowing. His past medical history is significant for COPD.    ROV 08/25/18 --71 year old gentleman with continued tobacco use, 1 pack daily, COPD.  We have been managing him on Symbicort and Spiriva.  He reports stable exertional dyspnea, he has to go slower. He is able to ADL, shopping. He has more trouble in extreme cold or heat. Minimal cough, occasional wheeze. He uses albuterol about 3-4x a day. No flares since last time. 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  He quit smoking almost 2 years ago.  He is on oxygen at 2.5 - 4 L/min.  Chronic prednisone at 5 mg daily.  Currently managed on Breztri.  Has albuterol HFA and nebulizer available, uses about 2x a day.  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 Unable to get around or do any ADL. Some cough but less mucous burden.  Needs flu shot today.   COPD (chronic obstructive pulmonary disease) (HCC) Very severe disease.  Appears to be in stage.  There may be an opportunity for [place] to improve his daily symptoms by increasing his prednisone.  Suppose we could also consider daily azithromycin although he does not have a high mucus burden this may also help his appetite.  Unclear how much benefit he will get.  I believe he is headed for palliative care/hospice.  I broached the subject about this today, and we will likely need to revisit.  No referral for now.  Please continue to wear your oxygen at 2.5-4 L/min depending on your level of exertion.  You need  to wear your oxygen at all times. We will increase your prednisone to 20 mg daily. Please continue Breztri 2 puffs twice a day. Use your albuterol either 1 nebulizer treatment or 2 puffs when you needed for shortness of breath, chest tightness, wheezing. Flu shot today. Follow with APP in 4 to 6 weeks to assess your progress on the increased dose of prednisone Follow with Dr Lamonte Sakai in 6 months or sooner if you have any problems   06/29/2022 - interim hx  Patient presents today for follow-up.  Patient has very severe COPD with high symptom burden.  During his last office visit prednisone was increased to 20 mg daily.  He is maintained on [employer].  He is O2 dependent, wearing 2.5-4 L of oxygen.  Consider adding daily azithromycin although he does not have high mucus burden        No Known Allergies  Immunization History  Administered Date(s) Administered   Fluad Quad(high Dose 65+) 07/07/2019, 08/05/2020, 05/07/2022   Influenza Whole 07/25/2009   Influenza, High Dose Seasonal PF 09/03/2017, 08/05/2018   Influenza,inj,Quad PF,6+ Mos 06/19/2013, 08/15/2014, 07/17/2015, 07/15/2016   Pneumococcal Conjugate-13 08/15/2014   Pneumococcal Polysaccharide-23 08/26/2016   Tdap 04/28/2021   Zoster Recombinat (Shingrix) 03/07/2018, 06/15/2018    Past Medical History:  Diagnosis Date   ACS (acute coronary syndrome) ([place]) 04/19/2021   Alcohol abuse    Aortic atherosclerosis ([place])    CAD in native  artery    Chronic respiratory failure (HCC)    COPD (chronic obstructive pulmonary disease) (HCC)    DJD (degenerative joint disease)    diffusely   GERD (gastroesophageal reflux disease)    Hyperlipidemia    On home O2    Paroxysmal atrial fibrillation (HCC)    Pericardial effusion    Pneumonia due to COVID-19 virus    PUD (peptic ulcer disease)    with bleeding   Rotator cuff disorder    has been evaluated by Dr Clifton James and [facility]   Stroke (cerebrum) ([place])     Tobacco  History: Social History   Tobacco Use  Smoking Status Former   Packs/day: 1.00   Years: 40.00   Total pack years: 40.00   Types: Cigarettes  Smokeless Tobacco Never  Tobacco Comments   declines patch   Counseling given: Not Answered Tobacco comments: declines patch   Outpatient Medications Prior to Visit  Medication Sig Dispense Refill   albuterol (PROVENTIL) (2.5 MG/3ML) 0.083% nebulizer solution Take 3 mLs (2.5 mg total) by nebulization every 4 (four) hours as needed for wheezing or shortness of breath. 180 mL 4   albuterol (VENTOLIN HFA) 108 (90 Base) MCG/ACT inhaler Inhale 2 puffs into the lungs every 6 (six) hours as needed for wheezing or shortness of breath. 18 g 5   aspirin 81 MG tablet Take 81 mg by mouth at bedtime.     atorvastatin (LIPITOR) 40 MG tablet Take 1 tablet (40 mg total) by mouth at bedtime. 90 tablet 3   benzonatate (TESSALON PERLES) 100 MG capsule Take 1 capsule (100 mg total) by mouth 3 (three) times daily as needed for cough. 60 capsule 11   Blood Glucose Calibration (TRUE METRIX LEVEL 1) Low SOLN USE AS DIRECTED. To monitor fsbs 2 times per day dx e11.9 1 each 3   Blood Glucose Monitoring Suppl (TRUE METRIX AIR GLUCOSE METER) DEVI USE AS DIRECTED. To monitor fsbs 2 times per day dx e11.9 1 each 3   BREZTRI AEROSPHERE 160-9-4.8 MCG/ACT AERO INHALE 2 PUFFS into lungs 2 TIMES DAILY 32.1 g 3   Cholecalciferol (D3 PO) Take 1 tablet by mouth daily.     clotrimazole-betamethasone (LOTRISONE) cream APPLY TO SKIN 2 TIMES DAILY 30 g 1   Cyanocobalamin (VITAMIN B-12 PO) Take 1 tablet by mouth daily.     ELIQUIS 5 MG TABS tablet TAKE 1 TABLET BY MOUTH 2 TIMES DAILY 60 tablet 3   fluticasone (FLONASE) 50 MCG/ACT nasal spray Place 2 sprays into both nostrils daily. 48 g 3   glucose blood (RELION TRUE METRIX TEST STRIPS) test strip USE AS DIRECTED. To monitor fsbs 2 times per day dx e11.9 100 each 3   guaiFENesin (MUCINEX) 600 MG 12 hr tablet Take 1 tablet (600 mg total)  by mouth 2 (two) times daily.     isosorbide mononitrate (IMDUR) 30 MG 24 hr tablet TAKE 1/2 TABLET BY MOUTH EVERY DAY 45 tablet 3   LORazepam (ATIVAN) 0.5 MG tablet TAKE 1 TABLET TWICE DAILY AS NEEDED FOR ANXIETY 60 tablet 0   magnesium oxide (MAG-OX) 400 (240 Mg) MG tablet Take 1 tablet (400 mg total) by mouth daily. 100 tablet 0   metoprolol tartrate (LOPRESSOR) 50 MG tablet Take 1 tablet (50 mg total) by mouth 2 (two) times daily. 60 tablet 3   Multiple Vitamin (MULTIVITAMIN WITH MINERALS) TABS tablet Take 1 tablet by mouth daily. 90 tablet 0   nitroGLYCERIN (NITROSTAT) 0.4 MG SL tablet DISSOLVE  1 TABLET UNDER THE TONGUE EVERY 5 MINUTES AS NEEDED FOR CHEST PAIN 25 tablet 0   omeprazole (PRILOSEC) 40 MG capsule Take 1 capsule (40 mg total) by mouth daily. 90 capsule 3   predniSONE (DELTASONE) 10 MG tablet TAKE 2 TABLETS BY MOUTH DAILY WITH BREAKFAST 60 tablet 3   predniSONE (DELTASONE) 20 MG tablet 2 tabs po daily x 4 days 8 tablet 0   predniSONE (DELTASONE) 5 MG tablet Take 1 tablet (5 mg total) by mouth daily with breakfast. 90 tablet 3   QUEtiapine (SEROQUEL) 25 MG tablet Take 1 tablet (25 mg total) by mouth at bedtime. 90 tablet 3   No facility-administered medications prior to visit.      Review of Systems  Review of Systems   Physical Exam  There were no vitals taken for this visit. Physical Exam   Lab Results:  CBC    Component Value Date/Time   WBC 19.3 (H) 06/06/2022 0220   RBC 5.85 (H) 06/06/2022 0220   HGB 14.7 06/06/2022 0220   HCT 48.7 06/06/2022 0220   PLT 557 (H) 06/06/2022 0220   MCV 83.2 06/06/2022 0220   MCH 25.1 (L) 06/06/2022 0220   MCHC 30.2 06/06/2022 0220   RDW 19.1 (H) 06/06/2022 0220   LYMPHSABS 1.0 06/06/2022 0220   MONOABS 0.5 06/06/2022 0220   EOSABS 0.1 06/06/2022 0220   BASOSABS 0.1 06/06/2022 0220    BMET    Component Value Date/Time   NA 139 06/06/2022 0220   K 4.3 06/06/2022 0220   CL 98 06/06/2022 0220   CO2 29 06/06/2022 0220    GLUCOSE 135 (H) 06/06/2022 0220   BUN 29 (H) 06/06/2022 0220   CREATININE 1.11 06/06/2022 0220   CREATININE 1.00 05/26/2021 1648   CALCIUM 10.0 06/06/2022 0220   GFRNONAA >60 06/06/2022 0220   GFRNONAA 90 08/05/2018 1151   GFRAA >60 03/08/2020 0636   GFRAA 105 08/05/2018 1151    BNP    Component Value Date/Time   BNP 84.3 06/06/2022 0220    ProBNP No results found for: "PROBNP"  Imaging: DG Chest Port 1 View  Result Date: 06/06/2022 CLINICAL DATA:  Shortness of breath. EXAM: PORTABLE CHEST 1 VIEW COMPARISON:  Portable chest 11/04/2021 FINDINGS: There is chronic blunting in the lateral sulci. Heart size and vasculature are normal with stable mediastinum and coronary artery stents on the left. 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 No focal pneumonia is evident. Thoracic cage is intact with osteopenia. IMPRESSION: No acute radiographic chest findings. Stable COPD chest with chronic change. Electronically Signed   By: Telford Nab M.D.   On: 06/06/2022 02:52     Assessment & Plan:   No problem-specific Assessment & Plan notes found for this encounter.     Martyn Ehrich, NP 06/29/2022

## 2022-07-27 ENCOUNTER — Emergency Department (HOSPITAL_COMMUNITY): Payer: Medicare HMO

## 2022-07-27 ENCOUNTER — Inpatient Hospital Stay (HOSPITAL_COMMUNITY)
Admission: EM | Admit: 2022-07-27 | Discharge: 2022-07-31 | DRG: 872 | Disposition: A | Discharge status: Skilled Nursing Facility | Payer: Medicare HMO | Attending: Internal Medicine | Admitting: Internal Medicine

## 2022-07-27 ENCOUNTER — Other Ambulatory Visit: Payer: Self-pay

## 2022-07-27 DIAGNOSIS — R296 Repeated falls: Secondary | ICD-10-CM | POA: Diagnosis present

## 2022-07-27 DIAGNOSIS — E785 Hyperlipidemia, unspecified: Secondary | ICD-10-CM | POA: Diagnosis not present

## 2022-07-27 DIAGNOSIS — Z800 Family history of malignant neoplasm of digestive organs: Secondary | ICD-10-CM

## 2022-07-27 DIAGNOSIS — I700 Atherosclerosis of aorta: Secondary | ICD-10-CM | POA: Diagnosis present

## 2022-07-27 DIAGNOSIS — Z833 Family history of diabetes mellitus: Secondary | ICD-10-CM

## 2022-07-27 DIAGNOSIS — T1490XA Injury, unspecified, initial encounter: Secondary | ICD-10-CM | POA: Diagnosis not present

## 2022-07-27 DIAGNOSIS — R0602 Shortness of breath: Secondary | ICD-10-CM

## 2022-07-27 DIAGNOSIS — Z9981 Dependence on supplemental oxygen: Secondary | ICD-10-CM | POA: Diagnosis not present

## 2022-07-27 DIAGNOSIS — M6281 Muscle weakness (generalized): Secondary | ICD-10-CM | POA: Diagnosis not present

## 2022-07-27 DIAGNOSIS — J449 Chronic obstructive pulmonary disease, unspecified: Secondary | ICD-10-CM | POA: Diagnosis not present

## 2022-07-27 DIAGNOSIS — Z8616 Personal history of COVID-19: Secondary | ICD-10-CM | POA: Diagnosis not present

## 2022-07-27 DIAGNOSIS — S81011A Laceration without foreign body, right knee, initial encounter: Secondary | ICD-10-CM | POA: Diagnosis present

## 2022-07-27 DIAGNOSIS — I2510 Atherosclerotic heart disease of native coronary artery without angina pectoris: Secondary | ICD-10-CM | POA: Diagnosis not present

## 2022-07-27 DIAGNOSIS — R54 Age-related physical debility: Secondary | ICD-10-CM | POA: Diagnosis present

## 2022-07-27 DIAGNOSIS — Y900 Blood alcohol level of less than 20 mg/100 ml: Secondary | ICD-10-CM | POA: Diagnosis present

## 2022-07-27 DIAGNOSIS — I252 Old myocardial infarction: Secondary | ICD-10-CM

## 2022-07-27 DIAGNOSIS — S51019A Laceration without foreign body of unspecified elbow, initial encounter: Secondary | ICD-10-CM | POA: Diagnosis not present

## 2022-07-27 DIAGNOSIS — W1830XA Fall on same level, unspecified, initial encounter: Secondary | ICD-10-CM | POA: Diagnosis present

## 2022-07-27 DIAGNOSIS — W19XXXA Unspecified fall, initial encounter: Principal | ICD-10-CM

## 2022-07-27 DIAGNOSIS — F418 Other specified anxiety disorders: Secondary | ICD-10-CM | POA: Diagnosis not present

## 2022-07-27 DIAGNOSIS — R509 Fever, unspecified: Secondary | ICD-10-CM | POA: Diagnosis not present

## 2022-07-27 DIAGNOSIS — N390 Urinary tract infection, site not specified: Secondary | ICD-10-CM | POA: Diagnosis not present

## 2022-07-27 DIAGNOSIS — I6782 Cerebral ischemia: Secondary | ICD-10-CM | POA: Diagnosis not present

## 2022-07-27 DIAGNOSIS — M159 Polyosteoarthritis, unspecified: Secondary | ICD-10-CM | POA: Diagnosis present

## 2022-07-27 DIAGNOSIS — Z825 Family history of asthma and other chronic lower respiratory diseases: Secondary | ICD-10-CM

## 2022-07-27 DIAGNOSIS — M1711 Unilateral primary osteoarthritis, right knee: Secondary | ICD-10-CM | POA: Diagnosis not present

## 2022-07-27 DIAGNOSIS — Y92012 Bathroom of single-family (private) house as the place of occurrence of the external cause: Secondary | ICD-10-CM

## 2022-07-27 DIAGNOSIS — I480 Paroxysmal atrial fibrillation: Secondary | ICD-10-CM | POA: Diagnosis present

## 2022-07-27 DIAGNOSIS — R4189 Other symptoms and signs involving cognitive functions and awareness: Secondary | ICD-10-CM | POA: Diagnosis present

## 2022-07-27 DIAGNOSIS — R627 Adult failure to thrive: Secondary | ICD-10-CM | POA: Diagnosis present

## 2022-07-27 DIAGNOSIS — I69393 Ataxia following cerebral infarction: Secondary | ICD-10-CM | POA: Diagnosis not present

## 2022-07-27 DIAGNOSIS — Z681 Body mass index (BMI) 19 or less, adult: Secondary | ICD-10-CM

## 2022-07-27 DIAGNOSIS — Z87891 Personal history of nicotine dependence: Secondary | ICD-10-CM | POA: Diagnosis not present

## 2022-07-27 DIAGNOSIS — E440 Moderate protein-calorie malnutrition: Secondary | ICD-10-CM | POA: Diagnosis not present

## 2022-07-27 DIAGNOSIS — F1090 Alcohol use, unspecified, uncomplicated: Secondary | ICD-10-CM | POA: Diagnosis present

## 2022-07-27 DIAGNOSIS — J9612 Chronic respiratory failure with hypercapnia: Secondary | ICD-10-CM | POA: Diagnosis present

## 2022-07-27 DIAGNOSIS — G319 Degenerative disease of nervous system, unspecified: Secondary | ICD-10-CM | POA: Diagnosis not present

## 2022-07-27 DIAGNOSIS — Z7401 Bed confinement status: Secondary | ICD-10-CM | POA: Diagnosis not present

## 2022-07-27 DIAGNOSIS — J9611 Chronic respiratory failure with hypoxia: Secondary | ICD-10-CM | POA: Diagnosis not present

## 2022-07-27 DIAGNOSIS — F1010 Alcohol abuse, uncomplicated: Secondary | ICD-10-CM | POA: Diagnosis present

## 2022-07-27 DIAGNOSIS — I6930 Unspecified sequelae of cerebral infarction: Secondary | ICD-10-CM

## 2022-07-27 DIAGNOSIS — F32A Depression, unspecified: Secondary | ICD-10-CM | POA: Diagnosis present

## 2022-07-27 DIAGNOSIS — A4150 Gram-negative sepsis, unspecified: Secondary | ICD-10-CM | POA: Diagnosis not present

## 2022-07-27 DIAGNOSIS — Z8041 Family history of malignant neoplasm of ovary: Secondary | ICD-10-CM

## 2022-07-27 DIAGNOSIS — Z7952 Long term (current) use of systemic steroids: Secondary | ICD-10-CM

## 2022-07-27 DIAGNOSIS — Z955 Presence of coronary angioplasty implant and graft: Secondary | ICD-10-CM

## 2022-07-27 DIAGNOSIS — L89159 Pressure ulcer of sacral region, unspecified stage: Secondary | ICD-10-CM | POA: Diagnosis not present

## 2022-07-27 DIAGNOSIS — Z043 Encounter for examination and observation following other accident: Secondary | ICD-10-CM | POA: Diagnosis not present

## 2022-07-27 DIAGNOSIS — S51012A Laceration without foreign body of left elbow, initial encounter: Secondary | ICD-10-CM | POA: Diagnosis present

## 2022-07-27 DIAGNOSIS — I2542 Coronary artery dissection: Secondary | ICD-10-CM | POA: Diagnosis not present

## 2022-07-27 DIAGNOSIS — R410 Disorientation, unspecified: Secondary | ICD-10-CM

## 2022-07-27 DIAGNOSIS — K219 Gastro-esophageal reflux disease without esophagitis: Secondary | ICD-10-CM | POA: Diagnosis not present

## 2022-07-27 DIAGNOSIS — J439 Emphysema, unspecified: Secondary | ICD-10-CM | POA: Diagnosis not present

## 2022-07-27 DIAGNOSIS — G8929 Other chronic pain: Secondary | ICD-10-CM | POA: Diagnosis not present

## 2022-07-27 DIAGNOSIS — A419 Sepsis, unspecified organism: Secondary | ICD-10-CM | POA: Diagnosis present

## 2022-07-27 DIAGNOSIS — Z1152 Encounter for screening for COVID-19: Secondary | ICD-10-CM | POA: Diagnosis not present

## 2022-07-27 DIAGNOSIS — J441 Chronic obstructive pulmonary disease with (acute) exacerbation: Secondary | ICD-10-CM | POA: Diagnosis not present

## 2022-07-27 DIAGNOSIS — R64 Cachexia: Secondary | ICD-10-CM | POA: Diagnosis not present

## 2022-07-27 LAB — COMPREHENSIVE METABOLIC PANEL
ALT: 23 U/L (ref 0–44)
AST: 42 U/L — ABNORMAL HIGH (ref 15–41)
Albumin: 4.6 g/dL (ref 3.5–5.0)
Alkaline Phosphatase: 71 U/L (ref 38–126)
Anion gap: 12 (ref 5–15)
BUN: 26 mg/dL — ABNORMAL HIGH (ref 8–23)
CO2: 29 mmol/L (ref 22–32)
Calcium: 9.8 mg/dL (ref 8.9–10.3)
Chloride: 99 mmol/L (ref 98–111)
Creatinine, Ser: 1.18 mg/dL (ref 0.61–1.24)
GFR, Estimated: >60 mL/min (ref >60)
Glucose, Bld: 109 mg/dL — ABNORMAL HIGH (ref 70–99)
Potassium: 4.4 mmol/L (ref 3.5–5.1)
Sodium: 140 mmol/L (ref 135–145)
Total Bilirubin: 0.8 mg/dL (ref 0.3–1.2)
Total Protein: 7.7 g/dL (ref 6.5–8.1)

## 2022-07-27 LAB — I-STAT VENOUS BLOOD GAS, ED
Acid-Base Excess: 6 mmol/L — ABNORMAL HIGH (ref 0.0–2.0)
Bicarbonate: 34.5 mmol/L — ABNORMAL HIGH (ref 20.0–28.0)
Calcium, Ion: 1.21 mmol/L (ref 1.15–1.40)
HCT: 47 % (ref 39.0–52.0)
Hemoglobin: 16 g/dL (ref 13.0–17.0)
O2 Saturation: 98 %
Potassium: 4.2 mmol/L (ref 3.5–5.1)
Sodium: 139 mmol/L (ref 135–145)
TCO2: 36 mmol/L — ABNORMAL HIGH (ref 22–32)
pCO2, Ven: 65.8 mmHg — ABNORMAL HIGH (ref 44–60)
pH, Ven: 7.328 (ref 7.25–7.43)
pO2, Ven: 109 mmHg — ABNORMAL HIGH (ref 32–45)

## 2022-07-27 LAB — CBC WITH DIFFERENTIAL/PLATELET
Abs Immature Granulocytes: 0 10*3/uL (ref 0.00–0.07)
Basophils Absolute: 0 10*3/uL (ref 0.0–0.1)
Basophils Relative: 0 %
Eosinophils Absolute: 0.3 10*3/uL (ref 0.0–0.5)
Eosinophils Relative: 1 %
HCT: 47.7 % (ref 39.0–52.0)
Hemoglobin: 14.8 g/dL (ref 13.0–17.0)
Lymphocytes Relative: 1 %
Lymphs Abs: 0.3 10*3/uL — ABNORMAL LOW (ref 0.7–4.0)
MCH: 26.8 pg (ref 26.0–34.0)
MCHC: 31 g/dL (ref 30.0–36.0)
MCV: 86.4 fL (ref 80.0–100.0)
Monocytes Absolute: 1 10*3/uL (ref 0.1–1.0)
Monocytes Relative: 4 %
Neutro Abs: 23.7 10*3/uL — ABNORMAL HIGH (ref 1.7–7.7)
Neutrophils Relative %: 94 %
Platelets: 419 10*3/uL — ABNORMAL HIGH (ref 150–400)
RBC: 5.52 MIL/uL (ref 4.22–5.81)
RDW: 21.3 % — ABNORMAL HIGH (ref 11.5–15.5)
WBC: 25.2 10*3/uL — ABNORMAL HIGH (ref 4.0–10.5)
nRBC: 0.1 % (ref 0.0–0.2)
nRBC: 1 /100 WBC — ABNORMAL HIGH

## 2022-07-27 LAB — RAPID URINE DRUG SCREEN, HOSP PERFORMED
Amphetamines: NOT DETECTED
Barbiturates: NOT DETECTED
Benzodiazepines: NOT DETECTED
Cocaine: NOT DETECTED
Opiates: NOT DETECTED
Tetrahydrocannabinol: NOT DETECTED

## 2022-07-27 LAB — RESPIRATORY PANEL BY PCR

## 2022-07-27 LAB — APTT: aPTT: 30 seconds (ref 24–36)

## 2022-07-27 LAB — URINALYSIS, ROUTINE W REFLEX MICROSCOPIC
Bilirubin Urine: NEGATIVE
Glucose, UA: NEGATIVE mg/dL
Ketones, ur: NEGATIVE mg/dL
Nitrite: POSITIVE — AB
Protein, ur: 100 mg/dL — AB
Specific Gravity, Urine: 1.016 (ref 1.005–1.030)
pH: 5 (ref 5.0–8.0)

## 2022-07-27 LAB — LACTIC ACID, PLASMA
Lactic Acid, Venous: 1.6 mmol/L (ref 0.5–1.9)
Lactic Acid, Venous: 1.2 mmol/L (ref 0.5–1.9)

## 2022-07-27 LAB — PROTIME-INR
INR: 1.5 — ABNORMAL HIGH (ref 0.8–1.2)
Prothrombin Time: 17.8 seconds — ABNORMAL HIGH (ref 11.4–15.2)

## 2022-07-27 LAB — TROPONIN I (HIGH SENSITIVITY): Troponin I (High Sensitivity): 31 ng/L — ABNORMAL HIGH (ref <18)

## 2022-07-27 LAB — GLUCOSE, CAPILLARY: Glucose-Capillary: 172 mg/dL — ABNORMAL HIGH (ref 70–99)

## 2022-07-27 LAB — RESP PANEL BY RT-PCR (FLU A&B, COVID) ARPGX2
Influenza A by PCR: NEGATIVE
Influenza B by PCR: NEGATIVE
SARS Coronavirus 2 by RT PCR: NEGATIVE

## 2022-07-27 LAB — ETHANOL: Alcohol, Ethyl (B): <10 mg/dL (ref <10)

## 2022-07-27 LAB — BRAIN NATRIURETIC PEPTIDE: B Natriuretic Peptide: 442.2 pg/mL — ABNORMAL HIGH (ref 0.0–100.0)

## 2022-07-27 MED ORDER — GUAIFENESIN ER 600 MG PO TB12
600.0000 mg | ORAL_TABLET | Freq: Two times a day (BID) | ORAL | Status: DC
  Administered 2022-07-27 – 2022-07-31 (×9): 600 mg via ORAL
  Filled 2022-07-27 (×9): qty 1

## 2022-07-27 MED ORDER — PREDNISONE 20 MG PO TABS
20.0000 mg | ORAL_TABLET | Freq: Every day | ORAL | Status: DC
  Administered 2022-07-27 – 2022-07-29 (×3): 20 mg via ORAL
  Filled 2022-07-27 (×3): qty 1

## 2022-07-27 MED ORDER — MOMETASONE FURO-FORMOTEROL FUM 100-5 MCG/ACT IN AERO
2.0000 | INHALATION_SPRAY | Freq: Two times a day (BID) | RESPIRATORY_TRACT | Status: DC
  Administered 2022-07-27 – 2022-07-31 (×8): 2 via RESPIRATORY_TRACT
  Filled 2022-07-27: qty 8.8

## 2022-07-27 MED ORDER — ADULT MULTIVITAMIN W/MINERALS CH
1.0000 | ORAL_TABLET | Freq: Every day | ORAL | Status: DC
  Administered 2022-07-27 – 2022-07-31 (×5): 1 via ORAL
  Filled 2022-07-27 (×5): qty 1

## 2022-07-27 MED ORDER — METOPROLOL TARTRATE 50 MG PO TABS
50.0000 mg | ORAL_TABLET | Freq: Two times a day (BID) | ORAL | Status: DC
  Administered 2022-07-27 – 2022-07-31 (×8): 50 mg via ORAL
  Filled 2022-07-27 (×8): qty 1

## 2022-07-27 MED ORDER — IPRATROPIUM-ALBUTEROL 0.5-2.5 (3) MG/3ML IN SOLN
3.0000 mL | Freq: Four times a day (QID) | RESPIRATORY_TRACT | Status: DC
  Administered 2022-07-27 (×2): 3 mL via RESPIRATORY_TRACT
  Filled 2022-07-27 (×2): qty 3

## 2022-07-27 MED ORDER — PANTOPRAZOLE SODIUM 40 MG PO TBEC
80.0000 mg | DELAYED_RELEASE_TABLET | Freq: Every day | ORAL | Status: DC
  Administered 2022-07-27 – 2022-07-30 (×4): 80 mg via ORAL
  Filled 2022-07-27 (×3): qty 2

## 2022-07-27 MED ORDER — ACETAMINOPHEN 650 MG RE SUPP: 650.0000 mg | Freq: Four times a day (QID) | RECTAL | Status: DC | PRN

## 2022-07-27 MED ORDER — SODIUM CHLORIDE 0.9 % IV SOLN
2.0000 g | Freq: Once | INTRAVENOUS | Status: AC
  Administered 2022-07-27: 2 g via INTRAVENOUS
  Filled 2022-07-27: qty 20

## 2022-07-27 MED ORDER — SODIUM CHLORIDE 0.9 % IV SOLN
2.0000 g | Freq: Two times a day (BID) | INTRAVENOUS | Status: DC
  Administered 2022-07-27 – 2022-07-29 (×5): 2 g via INTRAVENOUS
  Filled 2022-07-27 (×5): qty 12.5

## 2022-07-27 MED ORDER — SODIUM CHLORIDE 0.9 % IV BOLUS
1000.0000 mL | Freq: Once | INTRAVENOUS | Status: AC
  Administered 2022-07-27: 1000 mL via INTRAVENOUS

## 2022-07-27 MED ORDER — MAGNESIUM OXIDE -MG SUPPLEMENT 400 (240 MG) MG PO TABS
400.0000 mg | ORAL_TABLET | Freq: Every day | ORAL | Status: DC
  Administered 2022-07-27 – 2022-07-31 (×5): 400 mg via ORAL
  Filled 2022-07-27 (×5): qty 1

## 2022-07-27 MED ORDER — ISOSORBIDE MONONITRATE ER 30 MG PO TB24
15.0000 mg | ORAL_TABLET | Freq: Every day | ORAL | Status: DC
  Administered 2022-07-27 – 2022-07-31 (×5): 15 mg via ORAL
  Filled 2022-07-27 (×5): qty 1

## 2022-07-27 MED ORDER — SODIUM CHLORIDE 0.9 % IV SOLN
500.0000 mg | Freq: Once | INTRAVENOUS | Status: AC
  Administered 2022-07-27: 500 mg via INTRAVENOUS
  Filled 2022-07-27: qty 5

## 2022-07-27 MED ORDER — IPRATROPIUM-ALBUTEROL 0.5-2.5 (3) MG/3ML IN SOLN
3.0000 mL | Freq: Three times a day (TID) | RESPIRATORY_TRACT | Status: DC
  Administered 2022-07-28 – 2022-07-31 (×10): 3 mL via RESPIRATORY_TRACT
  Filled 2022-07-27 (×9): qty 3

## 2022-07-27 MED ORDER — APIXABAN 5 MG PO TABS
5.0000 mg | ORAL_TABLET | Freq: Two times a day (BID) | ORAL | Status: DC
  Administered 2022-07-27 – 2022-07-31 (×8): 5 mg via ORAL
  Filled 2022-07-27 (×8): qty 1

## 2022-07-27 MED ORDER — ATORVASTATIN CALCIUM 40 MG PO TABS
40.0000 mg | ORAL_TABLET | Freq: Every day | ORAL | Status: DC
  Administered 2022-07-27 – 2022-07-30 (×4): 40 mg via ORAL
  Filled 2022-07-27 (×4): qty 1

## 2022-07-27 MED ORDER — FLUTICASONE PROPIONATE 50 MCG/ACT NA SUSP
2.0000 | Freq: Every day | NASAL | Status: DC
  Administered 2022-07-27 – 2022-07-31 (×4): 2 via NASAL
  Filled 2022-07-27: qty 16

## 2022-07-27 MED ORDER — ALBUTEROL SULFATE (2.5 MG/3ML) 0.083% IN NEBU: 2.5000 mg | INHALATION_SOLUTION | Freq: Four times a day (QID) | RESPIRATORY_TRACT | Status: DC | PRN

## 2022-07-27 MED ORDER — BUDESON-GLYCOPYRROL-FORMOTEROL 160-9-4.8 MCG/ACT IN AERO: 2.0000 | INHALATION_SPRAY | Freq: Two times a day (BID) | RESPIRATORY_TRACT | Status: DC

## 2022-07-27 MED ORDER — LEVALBUTEROL HCL 0.63 MG/3ML IN NEBU: 0.6300 mg | INHALATION_SOLUTION | Freq: Four times a day (QID) | RESPIRATORY_TRACT | Status: DC | PRN

## 2022-07-27 MED ORDER — METHYLPREDNISOLONE SODIUM SUCC 125 MG IJ SOLR
125.0000 mg | Freq: Once | INTRAMUSCULAR | Status: AC
  Administered 2022-07-27: 125 mg via INTRAVENOUS
  Filled 2022-07-27: qty 2

## 2022-07-27 MED ORDER — ACETAMINOPHEN 325 MG PO TABS: 650.0000 mg | ORAL_TABLET | Freq: Four times a day (QID) | ORAL | Status: DC | PRN

## 2022-07-27 MED ORDER — QUETIAPINE FUMARATE 25 MG PO TABS
25.0000 mg | ORAL_TABLET | Freq: Every day | ORAL | Status: DC
  Administered 2022-07-27 – 2022-07-30 (×4): 25 mg via ORAL
  Filled 2022-07-27 (×4): qty 1

## 2022-07-27 MED ORDER — SODIUM CHLORIDE 0.9 % IV SOLN: INTRAVENOUS | Status: AC

## 2022-07-27 MED ORDER — IPRATROPIUM-ALBUTEROL 0.5-2.5 (3) MG/3ML IN SOLN
3.0000 mL | Freq: Once | RESPIRATORY_TRACT | Status: AC
  Administered 2022-07-27: 3 mL via RESPIRATORY_TRACT
  Filled 2022-07-27: qty 3

## 2022-07-27 NOTE — H&P (Signed)
History and Physical    Patient: Zachary Chambers JJO:[id number] DOB: Oct 27, 1950 DOA: 07/27/2022 DOS: the patient was seen and examined on 07/27/2022 PCP: [name], MD  Patient coming from: Home - lives with his wife. Uses a cane to ambulate.    Chief Complaint: weakness/frequent falls  HPI: Zachary Chambers is a 71 y.o. male with medical history significant of COPD with chronic respiratory failure on 4-5L oxygen [place], PAF, CAD, GERD, HLD, hx of CVA, depression and anxiety, cognitive impairment, heavy alcohol use who presented to ED with complaints of  weakness, multiple falls and deconditioned. Symptoms started about 4 days ago. He has been stumbling and his wife has had to Mitch assist with walking. He then fell in the bathroom with standing and then after she got him sitting he fell backwards again. He then fell again and his wife brought him to ED. She states he had a fever about 2-3 days ago, but this was subjective. She gave tylenol. He doesn't think he had a fever. He has had a sore throat, sinus issues and worsening cough. His cough has been intermittent dry and then productive with increased viscosity of his pheghlm. and he can't catch his breath. He states he is more short of breath than normal. His cough is different than his normal cough with his COPD.   Denies any vision changes/headaches, chest pain or palpitations, abdominal pain, N/V/D, dysuria or leg swelling.    He does not smoke or drink alcohol. Quit 2 years ago.   ER Course:  vitals: temp: 101.1, bp: 130/76, HR ;102, RR: 17, oxygen: 92% on 4L  Pertinent labs: wbc: 25.2, platelets 419, BUN: 26, INR: 1.5, covid/flu negative CT head/neck: no acute finding Knee xray: no acute findings, OA Pelvis xray: no acute findings CXR: COPD, no acute findings Elbow: no acute finding Forearm: no acute finding  In ED: code sepsis activated. Given rocephin and azithromycin, 1L bolus, steroids and duoneb. TRH asked to admit.    Review of  Systems: As mentioned in the history of present illness. All other systems reviewed and are negative. Past Medical History:  Diagnosis Date   ACS (acute coronary syndrome) ([place]) 04/19/2021   Alcohol abuse    Aortic atherosclerosis (HCC)    CAD in native artery    Chronic respiratory failure (HCC)    COPD (chronic obstructive pulmonary disease) (HCC)    DJD (degenerative joint disease)    diffusely   GERD (gastroesophageal reflux disease)    Hyperlipidemia    On home O2    Paroxysmal atrial fibrillation (HCC)    Pericardial effusion    Pneumonia due to COVID-19 virus    PUD (peptic ulcer disease)    with bleeding   Rotator cuff disorder    has been evaluated by Dr Clifton James and [facility]   Stroke (cerebrum) [facility])    Past Surgical History:  Procedure Laterality Date   COLONOSCOPY     EYE SURGERY     right eye removed at age 34   heart stent     x3- 2006   Social History:  reports that he has quit smoking. His smoking use included cigarettes. He has a 40.00 pack-year smoking history. He has never used smokeless tobacco. He reports current alcohol use of about 30.0 standard drinks of alcohol per week. He reports that he does not currently use drugs.  No Known Allergies  Family History  Problem Relation Age of Onset   Ovarian cancer Mother  Diabetes Maternal Grandfather    COPD Father    Diabetes Paternal Grandfather    Colon cancer Paternal Grandfather    Esophageal cancer Neg Hx    Rectal cancer Neg Hx    Stomach cancer Neg Hx     Prior to Admission medications   Medication Sig Start Date End Date Taking? Authorizing Provider  albuterol (PROVENTIL) (2.5 MG/3ML) 0.083% nebulizer solution Take 3 mLs (2.5 mg total) by nebulization every 4 (four) hours as needed for wheezing or shortness of breath. 11/08/21  Yes Danford, Suann Larry, MD  atorvastatin (LIPITOR) 40 MG tablet Take 1 tablet (40 mg total) by mouth at bedtime. 11/25/21  Yes [name], MD  benzonatate  (TESSALON PERLES) 100 MG capsule Take 1 capsule (100 mg total) by mouth 3 (three) times daily as needed for cough. 11/20/21  Yes [name], MD  BREZTRI AEROSPHERE 160-9-4.8 MCG/ACT AERO INHALE 2 PUFFS into lungs 2 TIMES DAILY Patient taking differently: Inhale 2 puffs into the lungs in the morning and at bedtime. 04/28/22  Yes [name], MD  Cholecalciferol (D3 PO) Take 1 tablet by mouth daily.   Yes [provider]  clotrimazole-betamethasone (LOTRISONE) cream APPLY TO SKIN 2 TIMES DAILY 06/09/22  Yes [name], MD  Cyanocobalamin (VITAMIN B-12 PO) Take 1 tablet by mouth daily.   Yes [provider]  ELIQUIS 5 MG TABS tablet TAKE 1 TABLET BY MOUTH 2 TIMES DAILY 04/30/22  Yes [name], MD  fluticasone (FLONASE) 50 MCG/ACT nasal spray Place 2 sprays into both nostrils daily. 05/21/21  Yes [name], MD  isosorbide mononitrate (IMDUR) 30 MG 24 hr tablet TAKE 1/2 TABLET BY MOUTH EVERY DAY 06/09/22  Yes [name], MD  magnesium oxide (MAG-OX) 400 (240 Mg) MG tablet Take 1 tablet (400 mg total) by mouth daily. 03/16/21  Yes Pokhrel, Laxman, MD  metoprolol tartrate (LOPRESSOR) 50 MG tablet Take 1 tablet (50 mg total) by mouth 2 (two) times daily. 11/08/21  Yes Danford, Suann Larry, MD  Multiple Vitamin (MULTIVITAMIN WITH MINERALS) TABS tablet Take 1 tablet by mouth daily. 03/09/20  Yes Hall, Carole N, DO  nitroGLYCERIN (NITROSTAT) 0.4 MG SL tablet DISSOLVE 1 TABLET UNDER THE TONGUE EVERY 5 MINUTES AS NEEDED FOR CHEST PAIN 04/30/22  Yes [name], MD  omeprazole (PRILOSEC) 40 MG capsule Take 1 capsule (40 mg total) by mouth daily. 03/19/21  Yes [name], MD  predniSONE (DELTASONE) 10 MG tablet TAKE 2 TABLETS BY MOUTH DAILY WITH BREAKFAST Patient taking differently: Take 20 mg by mouth daily with breakfast. For appetite 06/11/22  Yes Byrum, Rose Fillers, MD  QUEtiapine (SEROQUEL) 25 MG tablet Take 1 tablet (25 mg total) by mouth at  bedtime. 05/07/22  Yes [name], MD  albuterol (VENTOLIN HFA) 108 (90 Base) MCG/ACT inhaler Inhale 2 puffs into the lungs every 6 (six) hours as needed for wheezing or shortness of breath. Patient not taking: Reported on 07/27/2022 09/03/21   [name], MD  aspirin 81 MG tablet Take 81 mg by mouth at bedtime. Patient not taking: Reported on 07/27/2022    [provider]  Blood Glucose Calibration (TRUE METRIX LEVEL 1) Low SOLN USE AS DIRECTED. To monitor fsbs 2 times per day dx e11.9 06/02/21   [name], MD  Blood Glucose Monitoring Suppl (TRUE METRIX AIR GLUCOSE METER) DEVI USE AS DIRECTED. To monitor fsbs 2 times per day dx e11.9 06/02/21   Pickard,  Cammie Mcgee, MD  glucose blood (RELION TRUE METRIX TEST STRIPS) test strip USE AS DIRECTED. To monitor fsbs 2 times per day dx e11.9 06/02/21   [name], MD  guaiFENesin (MUCINEX) 600 MG 12 hr tablet Take 1 tablet (600 mg total) by mouth 2 (two) times daily. Patient not taking: Reported on 07/27/2022 11/08/21   Edwin Dada, MD  predniSONE (DELTASONE) 20 MG tablet 2 tabs po daily x 4 days Patient not taking: Reported on 07/27/2022 06/06/22   Deno Etienne, DO  predniSONE (DELTASONE) 5 MG tablet Take 1 tablet (5 mg total) by mouth daily with breakfast. Patient not taking: Reported on 07/27/2022 12/26/21   [name], MD    Physical Exam: Vitals:   07/27/22 0845 07/27/22 0900 07/27/22 0915 07/27/22 1000  BP: 127/73 121/67 107/67   Pulse: 90 91 84   Resp: (!) 23 20 (!) 21   Temp:   99.1 F (37.3 C)   TempSrc:      SpO2: 97% 97% 96%   Weight:    56.2 kg  Height:    '5\' 8"'$  (1.727 m)   General:  Appears calm and comfortable and is in NAD. 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 No LE edema.   Respiratory:   CTA bilaterally with no wheezes/rales/rhonchi.  Normal respiratory effort. Abdomen:  soft, NT, ND, NABS Back:   normal alignment, no CVAT Skin:  mulitple skin tears on leg, left elbow. Stage 1 sacral ulcer   Musculoskeletal:  globally weak, good ROM, no bony abnormality Lower extremity:  No LE edema.  Limited foot exam with no ulcerations.  2+ distal pulses. Psychiatric:  grossly normal mood and affect, speech fluent and appropriate, AOx3 Neurologic:  CN 2-12 grossly intact, moves all extremities in coordinated fashion, sensation intact   Radiological Exams on Admission: Independently reviewed - see discussion in A/P where applicable  DG Elbow Complete Left  Result Date: 07/27/2022 CLINICAL DATA:  Fall on blood thinners. EXAM: LEFT ELBOW - COMPLETE 3 VIEW COMPARISON:  None Available. FINDINGS: Oblique lateral view. No visible joint effusion, fracture, or dislocation. Generalized osteopenia. IMPRESSION: No acute finding. Electronically Signed   By: Jorje Guild M.D.   On: 07/27/2022 06:52   DG Forearm Left  Result Date: 07/27/2022 CLINICAL DATA:  Fall on blood thinners EXAM: LEFT FOREARM - 2 VIEW COMPARISON:  None Available. FINDINGS: There is no evidence of fracture or other focal bone lesions. 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 Osteopenia and arterial calcification. IMPRESSION: No acute finding. Electronically Signed   By: Jorje Guild M.D.   On: 07/27/2022 06:51   DG Knee Complete 4 Views Right  Result Date: 07/27/2022 CLINICAL DATA:  Fall on blood thinners EXAM: RIGHT KNEE - COMPLETE 4 VIEW COMPARISON:  10/20/2017 FINDINGS: A true lateral view was not obtained, a notable limitation. No evidence of fracture, dislocation, or visible joint effusion. Tricompartmental degenerative spurring. Atheromatous calcification. IMPRESSION: Limited study without acute finding. Osteoarthritis. Electronically Signed   By: Jorje Guild M.D.   On:  07/27/2022 06:50   DG Pelvis Portable  Result Date: 07/27/2022 CLINICAL DATA:  Fall on blood thinners EXAM: PORTABLE PELVIS 1 VIEWS COMPARISON:  None Available. FINDINGS: Limited pelvis due to rotation, osteopenia, and hardware overlap. No fracture or dislocation  seen. Lumbar spine degeneration. Generalized stool retention. IMPRESSION: 1. Limited study without acute finding. 2. Generalized stool retention. Electronically Signed   By: Jorje Guild M.D.   On: 07/27/2022 06:49   DG Chest Port 1 View  Result Date: 07/27/2022 CLINICAL DATA:  Fall on blood thinners EXAM: PORTABLE CHEST 1 VIEW COMPARISON:  06/06/2022 FINDINGS: Chronic scar-like reticulation in the lower lungs. There is emphysema confirmed by CT. There is no edema, consolidation, effusion, or pneumothorax. Normal heart size and mediastinal contours. Coronary stenting. No visible fracture. IMPRESSION: COPD without acute superimposed finding. Electronically Signed   By: Jorje Guild M.D.   On: 07/27/2022 06:48   CT HEAD WO CONTRAST (5MM)  Result Date: 07/27/2022 CLINICAL DATA:  Facial trauma, blunt.  Level 2 trauma. EXAM: CT HEAD WITHOUT CONTRAST CT CERVICAL SPINE WITHOUT CONTRAST TECHNIQUE: Multidetector CT imaging of the head and cervical spine was performed following the standard protocol without intravenous contrast. Multiplanar CT image reconstructions of the cervical spine were also generated. RADIATION DOSE REDUCTION: This exam was performed according to the departmental dose-optimization program which includes automated exposure control, adjustment of the mA and/or kV according to patient size and/or use of iterative reconstruction technique. COMPARISON:  04/28/2021 FINDINGS: CT HEAD FINDINGS Brain: No evidence of acute infarction, hemorrhage, hydrocephalus, extra-axial collection or mass lesion/mass effect. Remote right frontal parietal infarct which is cortically based. Chronic small vessel ischemia in the cerebral white matter.  Generalized brain atrophy. Vascular: No hyperdense vessel or unexpected calcification. Skull: Negative for fracture Sinuses/Orbits: Right enucleation and prosthesis.  No visible injury CT CERVICAL SPINE FINDINGS Alignment: No traumatic malalignment. Degenerative anterolisthesis at C7-T1. Skull base and vertebrae: No acute fracture. No primary bone lesion or focal pathologic process. Soft tissues and spinal canal: No prevertebral fluid or swelling. No visible canal hematoma. Disc levels: Generalized disc space narrowing with endplate ridging. Prominent facet osteoarthritis at C7-T1. C3-4 ankylosis. Upper chest: Emphysema IMPRESSION: No evidence of acute intracranial or cervical spine injury. Electronically Signed   By: Jorje Guild M.D.   On: 07/27/2022 06:32   CT Cervical Spine Wo Contrast  Result Date: 07/27/2022 CLINICAL DATA:  Facial trauma, blunt.  Level 2 trauma. EXAM: CT HEAD WITHOUT CONTRAST CT CERVICAL SPINE WITHOUT CONTRAST TECHNIQUE: Multidetector CT imaging of the head and cervical spine was performed following the standard protocol without intravenous contrast. Multiplanar CT image reconstructions of the cervical spine were also generated. RADIATION DOSE REDUCTION: This exam was performed according to the departmental dose-optimization program which includes automated exposure control, adjustment of the mA and/or kV according to patient size and/or use of iterative reconstruction technique. COMPARISON:  04/28/2021 FINDINGS: CT HEAD FINDINGS Brain: No evidence of acute infarction, hemorrhage, hydrocephalus, extra-axial collection or mass lesion/mass effect. Remote right frontal parietal infarct which is cortically based. Chronic small vessel ischemia in the cerebral white matter. Generalized brain atrophy. Vascular: No hyperdense vessel or unexpected calcification. Skull: Negative for fracture Sinuses/Orbits: Right enucleation and prosthesis.  No visible injury CT CERVICAL SPINE FINDINGS  Alignment: No traumatic malalignment. Degenerative anterolisthesis at C7-T1. Skull base and vertebrae: No acute fracture. No primary bone lesion or focal pathologic process. Soft tissues and spinal canal: No prevertebral fluid or swelling. No visible canal hematoma. Disc levels: Generalized disc space narrowing with endplate ridging. Prominent facet osteoarthritis at C7-T1. C3-4 ankylosis. Upper chest: Emphysema IMPRESSION: No evidence of acute intracranial or cervical spine injury. Electronically Signed   By: Jorje Guild M.D.   On: 07/27/2022 06:32    EKG: Independently  reviewed.  NSR with rate 86; nonspecific ST changes with no evidence of acute ischemia   Labs on Admission: I have personally reviewed the available labs and imaging studies at the time of the admission.  Pertinent labs:   wbc: 25.2,  platelets 419,  BUN: 26,  INR: 1.5,  Assessment and Plan: Principal Problem:   Sepsis ([place]) Active Problems:   FTT (failure to thrive) in adult with weakness and frequent falls   multiple skin tears from falls and early sacral pressure ulcer   COPD (chronic obstructive pulmonary disease) (HCC)   Chronic respiratory failure with hypoxia and hypercapnia (HCC) - 2 L/min at rest, 5 L/min with activity, baseline POC2 50 mmHg   PAF (paroxysmal atrial fibrillation) (HCC)   CAD (coronary artery disease)   History of CVA with residual deficit   GERD (gastroesophageal reflux disease)   Hyperlipidemia   Heavy alcohol use   Cognitive impairment    Assessment and Plan: * Sepsis ([place]) 71 year old male presenting with progressive debility/weakness/FTT frequent falls found to be septic with tmax of 101.1, HR 102 and WBC of 25.2 (although on chronic prednisone)  -obs to telemetry  -possible urosepsis. UA appears infected, culture pending. CXR with no acute finding and ? COPD exacerbation. Given rocephin and azithromycin in ED, changing to cefepime to cover for lung +urine  -pan cultured -RVP and  PCT pending -trend WBC, but on '20mg'$  prednisone -lactic acid wnl  -covid/flu negative -continue IVF   FTT (failure to thrive) in adult with weakness and frequent falls Has end stage COPD and poor PO intake with weakness, frailty and frequent falls, weight loss -PT/OT and nutrition consult -CT head and other imaging negative for acute fracture or bleed on eliquis  -consider palliative care consult with end stage COPD and FTT   multiple skin tears from falls and early sacral pressure ulcer Wound care consult Maximize nutrition  Frequent turns in bed   COPD (chronic obstructive pulmonary disease) (HCC) End stage COPD on chronic prednisone, '20mg'$  daily and chronic oxygen  No wheezing on exam but has increased shortness of breath and intermittent increased viscosity of his sputum Given rocephin and azithromycin in ED, change to cefepime for broader coverage  Sputum cx Continue prednisone '20mg'$  daily, do not appreciate any wheezing. Received '125mg'$  of solumedrol in ED and may need an increase his prednisone if wheezing on future exams  -scheduled duonebs -xoponex prn -continue breztri -mucinex BID  BC pending    Chronic respiratory failure with hypoxia and hypercapnia (HCC) - 2 L/min at rest, 5 L/min with activity, baseline POC2 50 mmHg Stable on home oxygen   PAF (paroxysmal atrial fibrillation) (HCC) CHADS2-VASC of 5 Continue eliquis BID and metoprolol   CAD (coronary artery disease) inferolateral MI 2002 s/p PCI to LCx 2002 with staged PCI to LAD 200  No longer on ASA due to high dose prednisone Continue eliquis, statin, imdur and lopressor   History of CVA with residual deficit Stable, no neuro deficits  CT head with no acute findings    GERD (gastroesophageal reflux disease) Continue PPI  Hyperlipidemia Continue lipitor   Heavy alcohol use States he is no longer drinking-stopped 2 years ago  Ethanol pending   Cognitive impairment Per wife due to lack of oxygen  back in march of 2023 At baseline     Advance Care Planning:   Code Status: Full Code   Consults: nutrition/PT/OT/wound care   DVT Prophylaxis: eliquis   Family Communication: wife at bedside  Severity of Illness: The appropriate patient status for this patient is OBSERVATION. Observation status is judged to be reasonable and necessary in order to provide the required intensity of service to ensure the patient's safety. The patient's presenting symptoms, physical exam findings, and initial radiographic and laboratory data in the context of their medical condition is felt to place them at decreased risk for further clinical deterioration. Furthermore, it is anticipated that the patient will be medically stable for discharge from the hospital within 2 midnights of admission.   Author: Orma Flaming, MD 07/27/2022 11:19 AM  For on call review [email or web address].

## 2022-07-27 NOTE — ED Triage Notes (Signed)
Pt arrived after having a fall 2hr ago, visitor states that pt was walking and looked unsteady and then fell, he is on Elaquis   Family states they are unsure if pt hit his head.   Pt has some swelling in left arm  Pt has multiple skin tears and per family member has been confused since fall.

## 2022-07-27 NOTE — Assessment & Plan Note (Signed)
Stable on home oxygen

## 2022-07-27 NOTE — Progress Notes (Signed)
Orthopedic Tech Progress Note Patient Details:  Zachary Chambers 19-Oct-1950 [id number]  Patient ID: Zachary Chambers, male   DOB: [birth year], 71 y.o.   MRN: [id number] I attended trauma page Karolee Stamps 07/27/2022, 6:26 AM

## 2022-07-27 NOTE — Assessment & Plan Note (Addendum)
CHADS2-VASC of 5 Continue rate control with metoprolol and anticoagulation with apixaban.

## 2022-07-27 NOTE — Assessment & Plan Note (Signed)
Per wife due to lack of oxygen back in march of 2023 At baseline

## 2022-07-27 NOTE — ED Notes (Signed)
Lab called to add on PCT, respiratory panel, UDS.

## 2022-07-27 NOTE — Assessment & Plan Note (Signed)
71 year old male presenting with progressive debility/weakness/FTT frequent falls found to be septic with tmax of 101.1, HR 102 and WBC of 25.2 (although on chronic prednisone)  -obs to telemetry  -possible urosepsis. UA appears infected, culture pending. CXR with no acute finding and ? COPD exacerbation. Given rocephin and azithromycin in ED, changing to cefepime to cover for lung +urine  -pan cultured -RVP and PCT pending -trend WBC, but on '20mg'$  prednisone -lactic acid wnl  -covid/flu negative -continue IVF

## 2022-07-27 NOTE — Assessment & Plan Note (Addendum)
States he is no longer drinking-stopped 2 years ago  Ethanol pending

## 2022-07-27 NOTE — Assessment & Plan Note (Signed)
Patient continue to be very weak and deconditioned, PT and OT have recommended short term rehab.  Out of bed to chair tid with meals.

## 2022-07-27 NOTE — ED Notes (Signed)
ED TO INPATIENT HANDOFF REPORT  ED Nurse Name and Phone #: [phone]   S Name/Age/Gender Zachary Chambers Zachary Chambers 71 y.o. male Room/Bed: 046C/046C  Code Status   Code Status: Full Code  Home/SNF/Other Home Patient oriented to: self, place, time, and situation Is this baseline? Yes   Triage Complete: Triage complete  Chief Complaint Sepsis [facility]) [A41.9]  Triage Note Pt arrived after having a fall 2hr ago, visitor states that pt was walking and looked unsteady and then fell, he is on Elaquis   Family states they are unsure if pt hit his head.   Pt has some swelling in left arm  Pt has multiple skin tears and per family member has been confused since fall.     Allergies No Known Allergies  Level of Care/Admitting Diagnosis ED Disposition     ED Disposition  Admit   Condition  --   Comment  Hospital Area: [place] [[id number]]  Level of Care: Telemetry Medical [[id number]]  May place patient in observation at [facility] or [place] if equivalent level of care is available:: No  Covid Evaluation: Confirmed COVID Negative  Diagnosis: Sepsis [facility]) [[id number]]  Admitting Physician: Orma Flaming [[id number]]  Attending Physician: Orma Flaming [[id number]]          B Medical/Surgery History Past Medical History:  Diagnosis Date   ACS (acute coronary syndrome) ([place]) 04/19/2021   Alcohol abuse    Aortic atherosclerosis (HCC)    CAD in native artery    Chronic respiratory failure (HCC)    COPD (chronic obstructive pulmonary disease) ([place])    DJD (degenerative joint disease)    diffusely   GERD (gastroesophageal reflux disease)    Hyperlipidemia    On home O2    Paroxysmal atrial fibrillation (HCC)    Pericardial effusion    Pneumonia due to COVID-19 virus    PUD (peptic ulcer disease)    with bleeding   Rotator cuff disorder    has been evaluated by Dr Clifton James and [facility]   Stroke (cerebrum) [facility])    Past Surgical History:  Procedure Laterality Date    COLONOSCOPY     EYE SURGERY     right eye removed at age 28   heart stent     x3- 2006     A IV Location/Drains/Wounds Patient Lines/Drains/Airways Status     Active Line/Drains/Airways     Name Placement date Placement time Site Days   Peripheral IV 07/27/22 18 G 1" Left;Posterior;Proximal Forearm 07/27/22  0636  Forearm  less than 1   Peripheral IV 07/27/22 20 G Anterior;Proximal;Right Forearm 07/27/22  0659  Forearm  less than 1   Wound / Incision (Open or Dehisced) 04/19/21 Skin tear Back Left;Mid 04/19/21  1430  Back  464            Intake/Output Last 24 hours  Intake/Output Summary (Last 24 hours) at 07/27/2022 1538 Last data filed at 07/27/2022 1003 Gross per 24 hour  Intake 250 ml  Output --  Net 250 ml    Labs/Imaging Results for orders placed or performed during the hospital encounter of 07/27/22 (from the past 48 hour(s))  CBC with Differential     Status: Abnormal   Collection Time: 07/27/22  6:34 AM  Result Value Ref Range   WBC 25.2 (H) 4.0 - 10.5 K/uL   RBC 5.52 4.22 - 5.81 MIL/uL   Hemoglobin 14.8 13.0 - 17.0 g/dL   HCT 47.7 39.0 - 52.0 %  MCV 86.4 80.0 - 100.0 fL   MCH 26.8 26.0 - 34.0 pg   MCHC 31.0 30.0 - 36.0 g/dL   RDW 21.3 (H) 11.5 - 15.5 %   Platelets 419 (H) 150 - 400 K/uL   nRBC 0.1 0.0 - 0.2 %   Neutrophils Relative % 94 %   Neutro Abs 23.7 (H) 1.7 - 7.7 K/uL   Lymphocytes Relative 1 %   Lymphs Abs 0.3 (L) 0.7 - 4.0 K/uL   Monocytes Relative 4 %   Monocytes Absolute 1.0 0.1 - 1.0 K/uL   Eosinophils Relative 1 %   Eosinophils Absolute 0.3 0.0 - 0.5 K/uL   Basophils Relative 0 %   Basophils Absolute 0.0 0.0 - 0.1 K/uL   nRBC 1 (H) 0 /100 WBC   Abs Immature Granulocytes 0.00 0.00 - 0.07 K/uL    Comment: Performed at [facility] [address], [place], [place] [address]  Comprehensive metabolic panel     Status: Abnormal   Collection Time: 07/27/22  6:34 AM  Result Value Ref Range   Sodium 140 135 - 145 mmol/L    Potassium 4.4 3.5 - 5.1 mmol/L   Chloride 99 98 - 111 mmol/L   CO2 29 22 - 32 mmol/L   Glucose, Bld 109 (H) 70 - 99 mg/dL    Comment: Glucose reference range applies only to samples taken after fasting for at least 8 hours.   BUN 26 (H) 8 - 23 mg/dL   Creatinine, Ser 1.18 0.61 - 1.24 mg/dL   Calcium 9.8 8.9 - 10.3 mg/dL   Total Protein 7.7 6.5 - 8.1 g/dL   Albumin 4.6 3.5 - 5.0 g/dL   AST 42 (H) 15 - 41 U/L   ALT 23 0 - 44 U/L   Alkaline Phosphatase 71 38 - 126 U/L   Total Bilirubin 0.8 0.3 - 1.2 mg/dL   GFR, Estimated >60 >60 mL/min    Comment: (NOTE) Calculated using the CKD-EPI Creatinine Equation (2021)    Anion gap 12 5 - 15    Comment: Performed at [facility] [address] [place], [place] [address]  Resp Panel by RT-PCR (Flu A&B, Covid) Anterior Nasal Swab     Status: None   Collection Time: 07/27/22  6:34 AM   Specimen: Anterior Nasal Swab  Result Value Ref Range   SARS Coronavirus 2 by RT PCR NEGATIVE NEGATIVE    Comment: (NOTE) SARS-CoV-2 target nucleic acids are NOT DETECTED.  The SARS-CoV-2 RNA is generally detectable in upper respiratory specimens during the acute phase of infection. The lowest concentration of SARS-CoV-2 viral copies this assay can detect is 138 copies/mL. A negative result does not preclude SARS-Cov-2 infection and should not be used as the sole basis for treatment or other patient management decisions. A negative result may occur with  improper specimen collection/handling, submission of specimen other than nasopharyngeal swab, presence of viral mutation(s) within the areas targeted by this assay, and inadequate number of viral copies(<138 copies/mL). A negative result must be combined with clinical observations, patient history, and epidemiological information. The expected result is Negative.  Fact Sheet for Patients:  EntrepreneurPulse.com.au  Fact Sheet for Healthcare Providers:   IncredibleEmployment.be  This test is no Chambers yet approved or cleared by the [place] FDA and  has been authorized for detection and/or diagnosis of SARS-CoV-2 by FDA under an Emergency Use Authorization (EUA). This EUA will remain  in effect (meaning this test can be used) for  the duration of the COVID-19 declaration under Section 564(b)(1) of the Act, 21 U.S.C.section 360bbb-3(b)(1), unless the authorization is terminated  or revoked sooner.       Influenza A by PCR NEGATIVE NEGATIVE   Influenza B by PCR NEGATIVE NEGATIVE    Comment: (NOTE) The Xpert Xpress SARS-CoV-2/FLU/RSV plus assay is intended as an aid in the diagnosis of influenza from Nasopharyngeal swab specimens and should not be used as a sole basis for treatment. Nasal washings and aspirates are unacceptable for Xpert Xpress SARS-CoV-2/FLU/RSV testing.  Fact Sheet for Patients: EntrepreneurPulse.com.au  Fact Sheet for Healthcare Providers: IncredibleEmployment.be  This test is not yet approved or cleared by the [place] FDA and has been authorized for detection and/or diagnosis of SARS-CoV-2 by FDA under an Emergency Use Authorization (EUA). This EUA will remain in effect (meaning this test can be used) for the duration of the COVID-19 declaration under Section 564(b)(1) of the Act, 21 U.S.C. section 360bbb-3(b)(1), unless the authorization is terminated or revoked.  Performed at [facility], [place] [address], [place], [place] [address]   Lactic acid, plasma     Status: None   Collection Time: 07/27/22  6:34 AM  Result Value Ref Range   Lactic Acid, Venous 1.6 0.5 - 1.9 mmol/L    Comment: Performed at [facility] [address], [place], [place] [address]  Protime-INR     Status: Abnormal   Collection Time: 07/27/22  6:34 AM  Result Value Ref Range   Prothrombin Time 17.8 (H) 11.4 - 15.2 seconds   INR 1.5 (H) 0.8 - 1.2    Comment:  (NOTE) INR goal varies based on device and disease states. Performed at [facility], [place] [address] [place], [place] [address]   APTT     Status: None   Collection Time: 07/27/22  6:34 AM  Result Value Ref Range   aPTT 30 24 - 36 seconds    Comment: Performed at [facility] [address], [place], [place] [address]  I-Stat venous blood gas, ED     Status: Abnormal   Collection Time: 07/27/22  6:59 AM  Result Value Ref Range   pH, Ven 7.328 7.25 - 7.43   pCO2, Ven 65.8 (H) 44 - 60 mmHg   pO2, Ven 109 (H) 32 - 45 mmHg   Bicarbonate 34.5 (H) 20.0 - 28.0 mmol/L   TCO2 36 (H) 22 - 32 mmol/L   O2 Saturation 98 %   Acid-Base Excess 6.0 (H) 0.0 - 2.0 mmol/L   Sodium 139 135 - 145 mmol/L   Potassium 4.2 3.5 - 5.1 mmol/L   Calcium, Ion 1.21 1.15 - 1.40 mmol/L   HCT 47.0 39.0 - 52.0 %   Hemoglobin 16.0 13.0 - 17.0 g/dL   Sample type VENOUS    Comment NOTIFIED PHYSICIAN   Blood culture (routine x 2)     Status: None (Preliminary result)   Collection Time: 07/27/22  7:05 AM   Specimen: BLOOD  Result Value Ref Range   Specimen Description BLOOD SITE NOT SPECIFIED    Special Requests      BOTTLES DRAWN AEROBIC AND ANAEROBIC Blood Culture adequate volume   Culture      NO GROWTH < 12 HOURS Performed at [facility], [address] [place], [place] [address]    Report Status PENDING   Lactic acid, plasma     Status: None   Collection Time: 07/27/22  8:34 AM  Result Value Ref Range   Lactic Acid, Venous 1.2 0.5 - 1.9 mmol/L    Comment: Performed at [facility] [address] [place], [place] [address]  Urinalysis, Routine w reflex microscopic     Status: Abnormal   Collection Time: 07/27/22  8:43 AM  Result Value Ref Range   Color, Urine YELLOW YELLOW   APPearance HAZY (A) CLEAR   Specific Gravity, Urine 1.016 1.005 - 1.030   pH 5.0 5.0 - 8.0   Glucose, UA NEGATIVE NEGATIVE mg/dL   Hgb urine dipstick MODERATE (A) NEGATIVE   Bilirubin Urine NEGATIVE  NEGATIVE   Ketones, ur NEGATIVE NEGATIVE mg/dL   Protein, ur 100 (A) NEGATIVE mg/dL   Nitrite POSITIVE (A) NEGATIVE   Leukocytes,Ua MODERATE (A) NEGATIVE   RBC / HPF 11-20 0 - 5 RBC/hpf   WBC, UA 21-50 0 - 5 WBC/hpf   Bacteria, UA FEW (A) NONE SEEN   Squamous Epithelial / LPF 0-5 0 - 5   WBC Clumps PRESENT    Mucus PRESENT    Hyaline Casts, UA PRESENT    Granular Casts, UA PRESENT     Comment: Performed at [facility], [place] [address], [place], [place] [address]  Rapid urine drug screen (hospital performed)     Status: None   Collection Time: 07/27/22  8:43 AM  Result Value Ref Range   Opiates NONE DETECTED NONE DETECTED   Cocaine NONE DETECTED NONE DETECTED   Benzodiazepines NONE DETECTED NONE DETECTED   Amphetamines NONE DETECTED NONE DETECTED   Tetrahydrocannabinol NONE DETECTED NONE DETECTED   Barbiturates NONE DETECTED NONE DETECTED    Comment: (NOTE) DRUG SCREEN FOR MEDICAL PURPOSES ONLY.  IF CONFIRMATION IS NEEDED FOR ANY PURPOSE, NOTIFY LAB WITHIN 5 DAYS.  LOWEST DETECTABLE LIMITS FOR URINE DRUG SCREEN Drug Class                     Cutoff (ng/mL) Amphetamine and metabolites    1000 Barbiturate and metabolites    200 Benzodiazepine                 200 Opiates and metabolites        300 Cocaine and metabolites        300 THC                            50 Performed at [facility], [place] [address], [place], [place] [address]   Ethanol     Status: None   Collection Time: 07/27/22  9:30 AM  Result Value Ref Range   Alcohol, Ethyl (B) <10 <10 mg/dL    Comment: (NOTE) Lowest detectable limit for serum alcohol is 10 mg/dL.  For medical purposes only. Performed at [facility], [place] [address], [place], [place] [address]   Brain natriuretic peptide     Status: Abnormal   Collection Time: 07/27/22 12:44 PM  Result Value Ref Range   B Natriuretic Peptide 442.2 (H) 0.0 - 100.0 pg/mL    Comment: Performed at [facility] [address]  [place], [place] [address]  Troponin I (High Sensitivity)     Status: Abnormal   Collection Time: 07/27/22 12:44 PM  Result Value Ref Range   Troponin I (High Sensitivity) 31 (H) <18 ng/L    Comment: (NOTE) Elevated high sensitivity troponin I (hsTnI) values and significant  changes across serial measurements may suggest  ACS but many other  chronic and acute conditions are known to elevate hsTnI results.  Refer to the "Links" section for chest pain algorithms and additional  guidance. Performed at [facility], [place] [address], [place], [place] [address]    DG Elbow Complete Left  Result Date: 07/27/2022 CLINICAL DATA:  Fall on blood thinners. EXAM: LEFT ELBOW - COMPLETE 3 VIEW COMPARISON:  None Available. FINDINGS: Oblique lateral view. No visible joint effusion, fracture, or dislocation. Generalized osteopenia. IMPRESSION: No acute finding. Electronically Signed   By: Jorje Guild M.D.   On: 07/27/2022 06:52   DG Forearm Left  Result Date: 07/27/2022 CLINICAL DATA:  Fall on blood thinners EXAM: LEFT FOREARM - 2 VIEW COMPARISON:  None Available. FINDINGS: There is no evidence of fracture or other focal bone lesions. 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 Osteopenia and arterial calcification. IMPRESSION: No acute finding. Electronically Signed   By: Jorje Guild M.D.   On: 07/27/2022 06:51   DG Knee Complete 4 Views Right  Result Date: 07/27/2022 CLINICAL DATA:  Fall on blood thinners EXAM: RIGHT KNEE - COMPLETE 4 VIEW COMPARISON:  10/20/2017 FINDINGS: A true lateral view was not obtained, a notable limitation. No evidence of fracture, dislocation, or visible joint effusion. Tricompartmental degenerative spurring. Atheromatous calcification. IMPRESSION: Limited study without acute finding. Osteoarthritis. Electronically Signed   By: Jorje Guild M.D.   On: 07/27/2022 06:50   DG Pelvis Portable  Result Date: 07/27/2022 CLINICAL DATA:  Fall on blood  thinners EXAM: PORTABLE PELVIS 1 VIEWS COMPARISON:  None Available. FINDINGS: Limited pelvis due to rotation, osteopenia, and hardware overlap. No fracture or dislocation seen. Lumbar spine degeneration. Generalized stool retention. IMPRESSION: 1. Limited study without acute finding. 2. Generalized stool retention. Electronically Signed   By: Jorje Guild M.D.   On: 07/27/2022 06:49   DG Chest Port 1 View  Result Date: 07/27/2022 CLINICAL DATA:  Fall on blood thinners EXAM: PORTABLE CHEST 1 VIEW COMPARISON:  06/06/2022 FINDINGS: Chronic scar-like reticulation in the lower lungs. There is emphysema confirmed by CT. There is no edema, consolidation, effusion, or pneumothorax. Normal heart size and mediastinal contours. Coronary stenting. No visible fracture. IMPRESSION: COPD without acute superimposed finding. Electronically Signed   By: Jorje Guild M.D.   On: 07/27/2022 06:48   CT HEAD WO CONTRAST (5MM)  Result Date: 07/27/2022 CLINICAL DATA:  Facial trauma, blunt.  Level 2 trauma. EXAM: CT HEAD WITHOUT CONTRAST CT CERVICAL SPINE WITHOUT CONTRAST TECHNIQUE: Multidetector CT imaging of the head and cervical spine was performed following the standard protocol without intravenous contrast. Multiplanar CT image reconstructions of the cervical spine were also generated. RADIATION DOSE REDUCTION: This exam was performed according to the departmental dose-optimization program which includes automated exposure control, adjustment of the mA and/or kV according to patient size and/or use of iterative reconstruction technique. COMPARISON:  04/28/2021 FINDINGS: CT HEAD FINDINGS Brain: No evidence of acute infarction, hemorrhage, hydrocephalus, extra-axial collection or mass lesion/mass effect. Remote right frontal parietal infarct which is cortically based. Chronic small vessel ischemia in the cerebral white matter. Generalized brain atrophy. Vascular: No hyperdense vessel or unexpected calcification. Skull:  Negative for fracture Sinuses/Orbits: Right enucleation and prosthesis.  No visible injury CT CERVICAL SPINE FINDINGS Alignment: No traumatic malalignment. Degenerative anterolisthesis at C7-T1. Skull base and vertebrae: No acute fracture. No primary bone lesion or focal pathologic process. Soft tissues and spinal canal: No prevertebral fluid or swelling. No visible canal hematoma. Disc levels: Generalized disc space  narrowing with endplate ridging. Prominent facet osteoarthritis at C7-T1. C3-4 ankylosis. Upper chest: Emphysema IMPRESSION: No evidence of acute intracranial or cervical spine injury. Electronically Signed   By: Jorje Guild M.D.   On: 07/27/2022 06:32   CT Cervical Spine Wo Contrast  Result Date: 07/27/2022 CLINICAL DATA:  Facial trauma, blunt.  Level 2 trauma. EXAM: CT HEAD WITHOUT CONTRAST CT CERVICAL SPINE WITHOUT CONTRAST TECHNIQUE: Multidetector CT imaging of the head and cervical spine was performed following the standard protocol without intravenous contrast. Multiplanar CT image reconstructions of the cervical spine were also generated. RADIATION DOSE REDUCTION: This exam was performed according to the departmental dose-optimization program which includes automated exposure control, adjustment of the mA and/or kV according to patient size and/or use of iterative reconstruction technique. COMPARISON:  04/28/2021 FINDINGS: CT HEAD FINDINGS Brain: No evidence of acute infarction, hemorrhage, hydrocephalus, extra-axial collection or mass lesion/mass effect. Remote right frontal parietal infarct which is cortically based. Chronic small vessel ischemia in the cerebral white matter. Generalized brain atrophy. Vascular: No hyperdense vessel or unexpected calcification. Skull: Negative for fracture Sinuses/Orbits: Right enucleation and prosthesis.  No visible injury CT CERVICAL SPINE FINDINGS Alignment: No traumatic malalignment. Degenerative anterolisthesis at C7-T1. Skull base and vertebrae:  No acute fracture. No primary bone lesion or focal pathologic process. Soft tissues and spinal canal: No prevertebral fluid or swelling. No visible canal hematoma. Disc levels: Generalized disc space narrowing with endplate ridging. Prominent facet osteoarthritis at C7-T1. C3-4 ankylosis. Upper chest: Emphysema IMPRESSION: No evidence of acute intracranial or cervical spine injury. Electronically Signed   By: Jorje Guild M.D.   On: 07/27/2022 06:32    Pending Labs Unresulted Labs (From admission, onward)     Start     Ordered   07/28/22 0034  Basic metabolic panel  Tomorrow morning,   R        07/27/22 1034   07/28/22 0500  CBC  Tomorrow morning,   R        07/27/22 1034   07/27/22 1039  Urine Culture  (Urine Culture)  Once,   R       Question:  Indication  Answer:  Sepsis   07/27/22 1038   07/27/22 1033  TSH  Once,   R        07/27/22 1034   07/27/22 1033  Expectorated Sputum Assessment w Gram Stain, Rflx to Resp Cult  Once,   R        07/27/22 1034   07/27/22 1031  Respiratory (~20 pathogens) panel by PCR  (COPD / Pneumonia / Cellulitis / Lower Extremity Wound)  Once,   R        07/27/22 1034   07/27/22 1009  Procalcitonin - Baseline  ONCE - URGENT,   URGENT        07/27/22 1008   07/27/22 0658  Blood culture (routine x 2)  BLOOD CULTURE X 2,   R      07/27/22 0657            Vitals/Pain Today's Vitals   07/27/22 0915 07/27/22 1000 07/27/22 1200 07/27/22 1315  BP: 107/67  109/66 122/61  Pulse: 84  78 83  Resp: (!) '21  19 17  '$ Temp: 99.1 F (37.3 C)   99 F (37.2 C)  TempSrc:    Oral  SpO2: 96%  94% (!) 87%  Weight:  56.2 kg    Height:  '5\' 8"'$  (1.727 m)      Isolation Precautions Droplet precaution  Medications Medications  ceFEPIme (MAXIPIME) 2 g in sodium chloride 0.9 % 100 mL IVPB (0 g Intravenous Stopped 07/27/22 1300)  ipratropium-albuterol (DUONEB) 0.5-2.5 (3) MG/3ML nebulizer solution 3 mL (3 mLs Nebulization Not Given 07/27/22 1300)  0.9 %  sodium  chloride infusion ( Intravenous New Bag/Given 07/27/22 1247)  acetaminophen (TYLENOL) tablet 650 mg (has no administration in time range)    Or  acetaminophen (TYLENOL) suppository 650 mg (has no administration in time range)  levalbuterol (XOPENEX) nebulizer solution 0.63 mg (has no administration in time range)  guaiFENesin (MUCINEX) 12 hr tablet 600 mg (600 mg Oral Given 07/27/22 1241)  ipratropium-albuterol (DUONEB) 0.5-2.5 (3) MG/3ML nebulizer solution 3 mL (3 mLs Nebulization Given 07/27/22 0618)  methylPREDNISolone sodium succinate (SOLU-MEDROL) 125 mg/2 mL injection 125 mg (125 mg Intravenous Given 07/27/22 0644)  cefTRIAXone (ROCEPHIN) 2 g in sodium chloride 0.9 % 100 mL IVPB (0 g Intravenous Stopped 07/27/22 0834)  sodium chloride 0.9 % bolus 1,000 mL (1,000 mLs Intravenous Bolus 07/27/22 0700)  azithromycin (ZITHROMAX) 500 mg in sodium chloride 0.9 % 250 mL IVPB (0 mg Intravenous Stopped 07/27/22 1003)    Mobility walks with person assist     Focused Assessments Pulmonary Assessment Handoff:  Lung sounds:   O2 Device: Nasal Cannula O2 Flow Rate (L/min): 4 L/min    R Recommendations: See Admitting Provider Note  Report given to:   Additional Notes:

## 2022-07-27 NOTE — Assessment & Plan Note (Signed)
Continue PPI ?

## 2022-07-27 NOTE — Assessment & Plan Note (Addendum)
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

## 2022-07-27 NOTE — Evaluation (Signed)
Physical Therapy Evaluation Patient Details Name: Zachary Chambers MRN: [id number] DOB: [birth year] Today's Date: 07/27/2022  History of Present Illness  71 yo male presents to [facility] on 12/11 with falls, weakness. Admitted for possible urosepsis. PMH includes  COPD with chronic respiratory failure on 4-5L oxygen [place], PAF, CAD, GERD, HLD, hx of CVA, depression and anxiety, cognitive impairment, heavy alcohol use.  Clinical Impression   Pt presents with generalized weakness, impaired balance with history of recent falls, dyspnea with minimal exertion and accompanying O2 desaturation, impaired mobility, and decreased activity tolerance. Pt to benefit from acute PT to address deficits. Pt requiring min-mod physical assist for transfer-level mobility, pt tolerated standing and marching in place x30 seconds before fatiguing with significant dyspnea and SpO2 86% on 2LO2 (increased O2 to 3LO2 for 2 minutes to recover and cues for breathing technique). PT to progress mobility as tolerated, and will continue to follow acutely.         Recommendations for follow up therapy are one component of a multi-disciplinary discharge planning process, led by the attending physician.  Recommendations may be updated based on patient status, additional functional criteria and insurance authorization.  Follow Up Recommendations Skilled nursing-short term rehab (<3 hours/day) Can patient physically be transported by private vehicle: Yes    Assistance Recommended at Discharge Frequent or constant Supervision/Assistance  Patient can return home with the following  A lot of help with walking and/or transfers;A lot of help with bathing/dressing/bathroom    Equipment Recommendations None recommended by PT  Recommendations for Other Services       Functional Status Assessment Patient has had a recent decline in their functional status and demonstrates the ability to make significant improvements in function in a reasonable and  predictable amount of time.     Precautions / Restrictions Precautions Precautions: Fall Restrictions Weight Bearing Restrictions: No      Mobility  Bed Mobility Overal bed mobility: Needs Assistance Bed Mobility: Supine to Sit, Sit to Supine     Supine to sit: Min assist, HOB elevated Sit to supine: Min assist, HOB elevated   General bed mobility comments: assist for truncal management, scooting back in bed once returned to supine    Transfers Overall transfer level: Needs assistance Equipment used: 1 person hand held assist (chair back, HHA) Transfers: Sit to/from Stand Sit to Stand: Min assist, From elevated surface           General transfer comment: assist for rise and steady, requires bilat UE support for dyamic marching. Standing tolerance x30 seconds before fatiguing    Ambulation/Gait               General Gait Details: nt  Financial trader Rankin (Stroke Patients Only)       Balance Overall balance assessment: Needs assistance, History of Falls Sitting-balance support: No upper extremity supported, Feet supported Sitting balance-Leahy Scale: Fair     Standing balance support: Bilateral upper extremity supported, During functional activity, Reliant on assistive device for balance Standing balance-Leahy Scale: Poor                               Pertinent Vitals/Pain Pain Assessment Pain Assessment: No/denies pain    Home Living Family/patient expects to be discharged to:: Private residence Living Arrangements: Spouse/significant other Available Help at Discharge: Family;Available PRN/intermittently Type of Home: [place]  Access: Stairs to enter;Ramped entrance Entrance Stairs-Rails: Right     Home Layout: Two level;Able to live on main level with bedroom/bathroom Home Equipment: Rolling Walker (2 wheels);Cane - single point      Prior Function Prior Level of Function : Needs  assist;History of Falls (last six months);Patient poor historian/Family not available             Mobility Comments: pt reports walking with a RW normally but "I haven't done much of it" ADLs Comments: Pt states his wife helps him with "everything", including dressing and bathing     Hand Dominance   Dominant Hand: Right    Extremity/Trunk Assessment   Upper Extremity Assessment Upper Extremity Assessment: Defer to OT evaluation    Lower Extremity Assessment Lower Extremity Assessment: Generalized weakness    Cervical / Trunk Assessment Cervical / Trunk Assessment: Normal  Communication   Communication: No difficulties  Cognition Arousal/Alertness: Awake/alert Behavior During Therapy: WFL for tasks assessed/performed Overall Cognitive Status: History of cognitive impairments - at baseline                                 General Comments: documented history of cognitive impairment, pleasant and follows one-step commands with increased time        General Comments General comments (skin integrity, edema, etc.): DOE 3/4 post-transfer when returned to supine, on 2LO2 and SpO2 86%. Increased O2 to 3LO2 for recovery to 93%, pt placed back on 2LO2 when recovered. Wounds on LEs, UEs suspect from falls    Exercises     Assessment/Plan    PT Assessment Patient needs continued PT services  PT Problem List Decreased strength;Decreased mobility;Decreased activity tolerance;Decreased balance;Decreased knowledge of use of DME;Cardiopulmonary status limiting activity;Decreased safety awareness;Decreased coordination       PT Treatment Interventions DME instruction;Therapeutic activities;Gait training;Therapeutic exercise;Patient/family education;Balance training;Stair training;Functional mobility training;Neuromuscular re-education    PT Goals (Current goals can be found in the Care Plan section)  Acute Rehab PT Goals Patient Stated Goal: home PT Goal  Formulation: With patient Time For Goal Achievement: 08/10/22 Potential to Achieve Goals: Good    Frequency Min 2X/week     Co-evaluation               AM-PAC PT "6 Clicks" Mobility  Outcome Measure Help needed turning from your back to your side while in a flat bed without using bedrails?: A Little Help needed moving from lying on your back to sitting on the side of a flat bed without using bedrails?: A Lot Help needed moving to and from a bed to a chair (including a wheelchair)?: A Lot Help needed standing up from a chair using your arms (e.g., wheelchair or bedside chair)?: A Lot Help needed to walk in hospital room?: A Lot Help needed climbing 3-5 steps with a railing? : Total 6 Click Score: 12    End of Session Equipment Utilized During Treatment: Gait belt Activity Tolerance: Patient tolerated treatment well Patient left: with call bell/phone within reach;in bed (transport taking pt to 84M from ED) Nurse Communication: Mobility status PT Visit Diagnosis: Unsteadiness on feet (R26.81);History of falling (Z91.81)    Time: 7353-2992 PT Time Calculation (min) (ACUTE ONLY): 12 min   Charges:   PT Evaluation $PT Eval Low Complexity: 1 Low         Lionell Matuszak S, PT DPT Acute Rehabilitation Services Pager [phone]  Office [phone]   [place]  [place] 07/27/2022, 4:54 PM

## 2022-07-27 NOTE — ED Provider Notes (Signed)
Patient care signed out to follow-up results, reassess and admit to medicine.  Patient presented after multiple low risk falls over the past week due to general weakness, deconditioning gradually worsening confusion.  Broad differential in terms of confusion because including alcohol use, hypercapnia, sepsis/fever, intracranial hemorrhage, medication related, other.  On assessment patient has made mild improvement per family member, general weakness on exam with multiple skin tears, abdomen soft nontender, minimal wheezing, normal work of breathing stable on 4 L home oxygen.  With frequent falls, general weakness, clinically COPD exacerbation and mild confusion plan for admission to hospitalist physician for further monitoring and workup.  Patient already received antibiotics for COPD exacerbation and nebs as needed.  Urinal given the patient to obtain urine.  Hospitalist paged.  Oral fluids plan.  Fall, initial encounter  SOB (shortness of breath)  Fever, unspecified fever cause  COPD with acute exacerbation ([place])  Confusion    Elnora Morrison, MD 07/27/22 [phone]

## 2022-07-27 NOTE — ED Provider Triage Note (Signed)
Emergency Medicine Provider Triage Evaluation Note  Zachary Chambers , a 71 y.o. male  was evaluated in triage.  Pt complains of fall.  Family reports she was assisting him to the bathroom when he fell, knees bent over the tub and struck head on shower wall.  No LOC.  Has skin tears to left elbow, right knee, and pain left forearm.  He has been confused, repetitive questioning since this occurred.  He is on eliquis.  Tetanus UTD.  Review of Systems  Positive: Fall on thinners Negative: vomiting  Physical Exam  BP 130/76 (BP Location: Right Arm)   Pulse (!) 102   Temp 99.1 F (37.3 C)   Resp 17   SpO2 92%  Gen:   Awake, no distress   Resp:  Normal effort  MSK:   Moves extremities without difficulty  Other:  Repetitive questioning in triage with family, no visible head trauma, skin tears right knee, left elbow, area of swelling to left forearm  Medical Decision Making  Medically screening exam initiated at 5:29 AM.  Appropriate orders placed.  Zachary Chambers was informed that the remainder of the evaluation will be completed by another provider, this initial triage assessment does not replace that evaluation, and the importance of remaining in the ED until their evaluation is complete.  Fall on thinners, reported head trauma w/confusion.  Will activate level II trauma based on criteria.  Placed in c-collar.  CT head/neck ordered along with screening pelvis/chest films.  Films of affected areas also ordered.  Basic labs sent.  Tetanus UTD.   Larene Pickett, PA-C 07/27/22 [phone]

## 2022-07-27 NOTE — Sepsis Progress Note (Signed)
Code Sepsis protocol being monitored by elink

## 2022-07-27 NOTE — Consult Note (Signed)
WOC Nurse Consult Note: Reason for Consult:Bilateral LE wounds, Stage 1 PI to sacrum Wound type:Pressure and trauma Pressure Injury POA: Yes Measurement:Bedside RN to measure and document wound measurements on nursing flow sheet with placement of first dressing today Wound SFS:[id number] of LE skin tears provided by Dr. Rogers Blocker upon admission.  Sacrum is non blanchable erythema, no open wound Drainage (amount, consistency, odor) None Periwound:intact, dry Dressing procedure/placement/frequency:I have provided guidance for Nursing for turning and repositioning and for minimizing time in the supine position. Bilateral heel boots are provided.  Topical care to the sacrum will be with placement of a silicone foam dressing. Topical care to the bilateral LE lesions will be to cleanse daily with NS, pat dry, cover with folded layers of xeroform (antimicrobial, nonadherent), top with ABD pads for comfort and secure with Kerlix roll gauze/paper tape.   [place] nursing team will not follow, but will remain available to this patient, the nursing and medical teams.  Please re-consult if needed.  Thank you for inviting [place] to participate in this patient's Plan of Care.  Maudie Flakes, MSN, RN, CNS, [place], Serita Grammes, [employer], [employer] phone:  [phone]

## 2022-07-27 NOTE — Progress Notes (Signed)
Plan to substitute Breztri with Ruthe Mannan + Incruse. Pt is on schedule duonebs so will hold off Incruse.  Onnie Boer, PharmD, BCIDP, AAHIVP, CPP Infectious Disease Pharmacist 07/27/2022 7:24 PM

## 2022-07-27 NOTE — ED Provider Notes (Signed)
[facility] Emergency Department Provider Note MRN:  [id number]  Arrival date & time: 07/27/22     Chief Complaint   Fall   History of Present Illness   Zachary Chambers is a 71 y.o. year-old male with a history of CAD, COPD, stroke presenting to the ED with chief complaint of fall.  Patient declining in functional status over the past several weeks.  Very weak today, 4 separate occasions where he could not support his own weight and fell to the ground.  Possibly hit his head.  Acting confused.  Seems to be more short of breath than normal.  Review of Systems  A thorough review of systems was obtained and all systems are negative except as noted in the HPI and PMH.   Patient's Health History    Past Medical History:  Diagnosis Date   ACS (acute coronary syndrome) ([place]) 04/19/2021   Alcohol abuse    Aortic atherosclerosis (HCC)    CAD in native artery    Chronic respiratory failure (HCC)    COPD (chronic obstructive pulmonary disease) (HCC)    DJD (degenerative joint disease)    diffusely   GERD (gastroesophageal reflux disease)    Hyperlipidemia    On home O2    Paroxysmal atrial fibrillation (HCC)    Pericardial effusion    Pneumonia due to COVID-19 virus    PUD (peptic ulcer disease)    with bleeding   Rotator cuff disorder    has been evaluated by Dr Clifton James and [facility]   Stroke (cerebrum) [facility])     Past Surgical History:  Procedure Laterality Date   COLONOSCOPY     EYE SURGERY     right eye removed at age 52   heart stent     x3- 2006    Family History  Problem Relation Age of Onset   Ovarian cancer Mother    Diabetes Maternal Grandfather    COPD Father    Diabetes Paternal Grandfather    Colon cancer Paternal Grandfather    Esophageal cancer Neg Hx    Rectal cancer Neg Hx    Stomach cancer Neg Hx     Social History   Socioeconomic History   Marital status: Single    Spouse name: Not on file   Number of children: Not on file    Years of education: Not on file   Highest education level: Not on file  Occupational History   Occupation: retired  Tobacco Use   Smoking status: Former    Packs/day: 1.00    Years: 40.00    Total pack years: 40.00    Types: Cigarettes   Smokeless tobacco: Never   Tobacco comments:    declines patch  Vaping Use   Vaping Use: Never used  Substance and Sexual Activity   Alcohol use: Yes    Alcohol/week: 30.0 standard drinks of alcohol    Types: 30 Cans of beer per week    Comment: h/o heavy use, now 4 beers per day   Drug use: Not Currently   Sexual activity: Yes  Other Topics Concern   Not on file  Social History Narrative   Right Handed   Lives in one story home       Patient has not smoked for 3 months   Patient on 2L of Oxygen    Social Determinants of Health   Financial Resource Strain: Medium Risk (08/01/2021)   Overall Financial Resource Strain (CARDIA)    Difficulty  of Paying Living Expenses: Somewhat hard  Food Insecurity: Food Insecurity Present (08/01/2021)   Hunger Vital Sign    Worried About Running Out of Food in the Last Year: Sometimes true    Ran Out of Food in the Last Year: Sometimes true  Transportation Needs: No Transportation Needs (08/01/2021)   PRAPARE - Hydrologist (Medical): No    Lack of Transportation (Non-Medical): No  Physical Activity: Insufficiently Active (08/01/2021)   Exercise Vital Sign    Days of Exercise per Week: 3 days    Minutes of Exercise per Session: 20 min  Stress: Stress Concern Present (08/01/2021)   [place]    Feeling of Stress : To some extent  Social Connections: Socially Integrated (08/01/2021)   Social Connection and Isolation Panel [NHANES]    Frequency of Communication with Friends and Family: More than three times a week    Frequency of Social Gatherings with Friends and Family: Once a week    Attends Religious  Services: 1 to 4 times per year    Active Member of [employer] or Organizations: No    Attends Archivist Meetings: 1 to 4 times per year    Marital Status: Living with partner  Intimate Partner Violence: Not At Risk (08/01/2021)   Humiliation, Afraid, Rape, and Kick questionnaire    Fear of Current or Ex-Partner: No    Emotionally Abused: No    Physically Abused: No    Sexually Abused: No     Physical Exam   Vitals:   07/27/22 0640 07/27/22 0700  BP: 116/62 (!) 146/60  Pulse: 89 87  Resp: (!) 30 19  Temp:    SpO2: 95% 97%    CONSTITUTIONAL: Ill-appearing, NAD NEURO/PSYCH: Alert, oriented to name, moves all extremities EYES:  eyes equal and reactive ENT/NECK:  no LAD, no JVD CARDIO: Regular rate, well-perfused, normal S1 and S2 PULM: Poor air movement, pursed lip breathing, tachypneic GI/GU:  non-distended, non-tender MSK/SPINE:  No gross deformities, no edema SKIN: Skin tears to bilateral knees   *Additional and/or pertinent findings included in MDM below  Diagnostic and Interventional Summary    EKG Interpretation  Date/Time:    Ventricular Rate:    PR Interval:    QRS Duration:   QT Interval:    QTC Calculation:   R Axis:     Text Interpretation:         Labs Reviewed  CBC WITH DIFFERENTIAL/PLATELET - Abnormal; Notable for the following components:      Result Value   RDW 21.3 (*)    Platelets 419 (*)    All other components within normal limits  PROTIME-INR - Abnormal; Notable for the following components:   Prothrombin Time 17.8 (*)    INR 1.5 (*)    All other components within normal limits  I-STAT VENOUS BLOOD GAS, ED - Abnormal; Notable for the following components:   pCO2, Ven 65.8 (*)    pO2, Ven 109 (*)    Bicarbonate 34.5 (*)    TCO2 36 (*)    Acid-Base Excess 6.0 (*)    All other components within normal limits  RESP PANEL BY RT-PCR (FLU A&B, COVID) ARPGX2  CULTURE, BLOOD (ROUTINE X 2)  CULTURE, BLOOD (ROUTINE X 2)  APTT   COMPREHENSIVE METABOLIC PANEL  URINALYSIS, ROUTINE W REFLEX MICROSCOPIC  LACTIC ACID, PLASMA  LACTIC ACID, PLASMA    CT HEAD WO CONTRAST (5MM)  Final  Result    CT Cervical Spine Wo Contrast  Final Result    DG Forearm Left  Final Result    DG Elbow Complete Left  Final Result    DG Chest Port 1 View  Final Result    DG Pelvis Portable  Final Result    DG Knee Complete 4 Views Right  Final Result      Medications  cefTRIAXone (ROCEPHIN) 2 g in sodium chloride 0.9 % 100 mL IVPB (2 g Intravenous New Bag/Given 07/27/22 0704)  azithromycin (ZITHROMAX) 500 mg in sodium chloride 0.9 % 250 mL IVPB (has no administration in time range)  ipratropium-albuterol (DUONEB) 0.5-2.5 (3) MG/3ML nebulizer solution 3 mL (3 mLs Nebulization Given 07/27/22 0618)  methylPREDNISolone sodium succinate (SOLU-MEDROL) 125 mg/2 mL injection 125 mg (125 mg Intravenous Given 07/27/22 0644)  sodium chloride 0.9 % bolus 1,000 mL (1,000 mLs Intravenous Bolus 07/27/22 0700)     Procedures  /  Critical Care .Critical Care  Performed by: Maudie Flakes, MD Authorized by: Maudie Flakes, MD   Critical care provider statement:    Critical care time (minutes):  35   Critical care was necessary to treat or prevent imminent or life-threatening deterioration of the following conditions: Concern for sepsis.   Critical care was time spent personally by me on the following activities:  Development of treatment plan with patient or surrogate, discussions with consultants, evaluation of patient's response to treatment, examination of patient, ordering and review of laboratory studies, ordering and review of radiographic studies, ordering and performing treatments and interventions, pulse oximetry, re-evaluation of patient's condition and review of old charts   ED Course and Medical Decision Making  Initial Impression and Ddx Differential diagnosis includes intracranial bleeding, sepsis, electrolyte disturbance,  pneumonia/UTI, pneumothorax, COPD exacerbation  Past medical/surgical history that increases complexity of ED encounter: COPD  Interpretation of Diagnostics I personally reviewed the Chest Xray and my interpretation is as follows: No pneumothorax  Labs pending  Patient Reassessment and Ultimate Disposition/Management     Signed out to oncoming provider, suspect will need admission for either sepsis or COPD exacerbation.  Patient management required discussion with the following services or consulting groups:  None  Complexity of Problems Addressed Acute illness or injury that poses threat of life of bodily function  Additional Data Reviewed and Analyzed Further history obtained from: Further history from spouse/family member  Additional Factors Impacting ED Encounter Risk Consideration of hospitalization  Barth Kirks. [place] Small, [place] [email or web address]  Final Clinical Impressions(s) / ED Diagnoses     ICD-10-CM   1. Fall, initial encounter  W19.XXXA     2. SOB (shortness of breath)  R06.02     3. Fever, unspecified fever cause  R50.9       ED Discharge Orders     None        Discharge Instructions Discussed with and Provided to Patient:   Discharge Instructions   None      Maudie Flakes, MD 07/27/22 [phone]

## 2022-07-27 NOTE — Assessment & Plan Note (Signed)
Stable, no neuro deficits  CT head with no acute findings

## 2022-07-27 NOTE — Assessment & Plan Note (Signed)
Wound care consult Maximize nutrition  Frequent turns in bed

## 2022-07-27 NOTE — Assessment & Plan Note (Deleted)
End stage COPD on chronic prednisone, '20mg'$  daily and chronic oxygen  No wheezing on exam but has increased shortness of breath and intermittent increased viscosity of his sputum Given rocephin and azithromycin in ED, change to cefepime for broader coverage  Sputum cx Continue prednisone '20mg'$  daily, do not appreciate any wheezing. Received '125mg'$  of solumedrol in ED and may need an increase his prednisone if wheezing on future exams  -scheduled duonebs -xoponex prn -continue breztri -mucinex BID  BC pending

## 2022-07-27 NOTE — Assessment & Plan Note (Signed)
Continue lipitor  ?

## 2022-07-28 DIAGNOSIS — R627 Adult failure to thrive: Secondary | ICD-10-CM | POA: Diagnosis not present

## 2022-07-28 DIAGNOSIS — F1010 Alcohol abuse, uncomplicated: Secondary | ICD-10-CM | POA: Diagnosis present

## 2022-07-28 DIAGNOSIS — Z8616 Personal history of COVID-19: Secondary | ICD-10-CM | POA: Diagnosis not present

## 2022-07-28 DIAGNOSIS — R64 Cachexia: Secondary | ICD-10-CM | POA: Diagnosis present

## 2022-07-28 DIAGNOSIS — Z87891 Personal history of nicotine dependence: Secondary | ICD-10-CM | POA: Diagnosis not present

## 2022-07-28 DIAGNOSIS — N390 Urinary tract infection, site not specified: Secondary | ICD-10-CM | POA: Diagnosis present

## 2022-07-28 DIAGNOSIS — J449 Chronic obstructive pulmonary disease, unspecified: Secondary | ICD-10-CM | POA: Diagnosis present

## 2022-07-28 DIAGNOSIS — S81011A Laceration without foreign body, right knee, initial encounter: Secondary | ICD-10-CM | POA: Diagnosis present

## 2022-07-28 DIAGNOSIS — F418 Other specified anxiety disorders: Secondary | ICD-10-CM | POA: Diagnosis present

## 2022-07-28 DIAGNOSIS — J9611 Chronic respiratory failure with hypoxia: Secondary | ICD-10-CM | POA: Diagnosis present

## 2022-07-28 DIAGNOSIS — W1830XA Fall on same level, unspecified, initial encounter: Secondary | ICD-10-CM | POA: Diagnosis present

## 2022-07-28 DIAGNOSIS — I700 Atherosclerosis of aorta: Secondary | ICD-10-CM | POA: Diagnosis present

## 2022-07-28 DIAGNOSIS — S51019A Laceration without foreign body of unspecified elbow, initial encounter: Secondary | ICD-10-CM | POA: Diagnosis not present

## 2022-07-28 DIAGNOSIS — Y92012 Bathroom of single-family (private) house as the place of occurrence of the external cause: Secondary | ICD-10-CM | POA: Diagnosis not present

## 2022-07-28 DIAGNOSIS — L89159 Pressure ulcer of sacral region, unspecified stage: Secondary | ICD-10-CM | POA: Diagnosis present

## 2022-07-28 DIAGNOSIS — E785 Hyperlipidemia, unspecified: Secondary | ICD-10-CM | POA: Diagnosis present

## 2022-07-28 DIAGNOSIS — J9612 Chronic respiratory failure with hypercapnia: Secondary | ICD-10-CM | POA: Diagnosis present

## 2022-07-28 DIAGNOSIS — Z1152 Encounter for screening for COVID-19: Secondary | ICD-10-CM | POA: Diagnosis not present

## 2022-07-28 DIAGNOSIS — R54 Age-related physical debility: Secondary | ICD-10-CM | POA: Diagnosis present

## 2022-07-28 DIAGNOSIS — Y900 Blood alcohol level of less than 20 mg/100 ml: Secondary | ICD-10-CM | POA: Diagnosis present

## 2022-07-28 DIAGNOSIS — Z681 Body mass index (BMI) 19 or less, adult: Secondary | ICD-10-CM | POA: Diagnosis not present

## 2022-07-28 DIAGNOSIS — I480 Paroxysmal atrial fibrillation: Secondary | ICD-10-CM | POA: Diagnosis present

## 2022-07-28 DIAGNOSIS — A4150 Gram-negative sepsis, unspecified: Secondary | ICD-10-CM | POA: Diagnosis not present

## 2022-07-28 DIAGNOSIS — I69393 Ataxia following cerebral infarction: Secondary | ICD-10-CM | POA: Diagnosis not present

## 2022-07-28 DIAGNOSIS — E440 Moderate protein-calorie malnutrition: Secondary | ICD-10-CM | POA: Diagnosis present

## 2022-07-28 DIAGNOSIS — R410 Disorientation, unspecified: Secondary | ICD-10-CM | POA: Diagnosis present

## 2022-07-28 DIAGNOSIS — I2510 Atherosclerotic heart disease of native coronary artery without angina pectoris: Secondary | ICD-10-CM | POA: Diagnosis not present

## 2022-07-28 DIAGNOSIS — K219 Gastro-esophageal reflux disease without esophagitis: Secondary | ICD-10-CM | POA: Diagnosis present

## 2022-07-28 DIAGNOSIS — A419 Sepsis, unspecified organism: Secondary | ICD-10-CM | POA: Diagnosis not present

## 2022-07-28 DIAGNOSIS — Z9981 Dependence on supplemental oxygen: Secondary | ICD-10-CM | POA: Diagnosis not present

## 2022-07-28 LAB — CBC
HCT: 35.3 % — ABNORMAL LOW (ref 39.0–52.0)
Hemoglobin: 10.9 g/dL — ABNORMAL LOW (ref 13.0–17.0)
MCH: 26.8 pg (ref 26.0–34.0)
MCHC: 30.9 g/dL (ref 30.0–36.0)
MCV: 86.7 fL (ref 80.0–100.0)
Platelets: 337 10*3/uL (ref 150–400)
RBC: 4.07 MIL/uL — ABNORMAL LOW (ref 4.22–5.81)
RDW: 21 % — ABNORMAL HIGH (ref 11.5–15.5)
WBC: 17.8 10*3/uL — ABNORMAL HIGH (ref 4.0–10.5)
nRBC: 0.1 % (ref 0.0–0.2)

## 2022-07-28 LAB — BASIC METABOLIC PANEL
Anion gap: 7 (ref 5–15)
BUN: 25 mg/dL — ABNORMAL HIGH (ref 8–23)
CO2: 29 mmol/L (ref 22–32)
Calcium: 8.4 mg/dL — ABNORMAL LOW (ref 8.9–10.3)
Chloride: 104 mmol/L (ref 98–111)
Creatinine, Ser: 1.03 mg/dL (ref 0.61–1.24)
GFR, Estimated: >60 mL/min (ref >60)
Glucose, Bld: 125 mg/dL — ABNORMAL HIGH (ref 70–99)
Potassium: 4.4 mmol/L (ref 3.5–5.1)
Sodium: 140 mmol/L (ref 135–145)

## 2022-07-28 MED ORDER — ENSURE ENLIVE PO LIQD
237.0000 mL | Freq: Two times a day (BID) | ORAL | Status: DC
  Administered 2022-07-29 – 2022-07-31 (×5): 237 mL via ORAL

## 2022-07-28 NOTE — Progress Notes (Signed)
Initial Nutrition Assessment  DOCUMENTATION CODES:   Non-severe (moderate) malnutrition in context of chronic illness  INTERVENTION:  - Liberalize diet to Regular diet.   - Add Ensure Enlive po BID, each supplement provides 350 kcal and 20 grams of protein.   NUTRITION DIAGNOSIS:   Moderate Malnutrition related to chronic illness as evidenced by moderate fat depletion, moderate muscle depletion, energy intake < or equal to 75% for > or equal to 1 month.  GOAL:   Patient will meet greater than or equal to 90% of their needs  MONITOR:   PO intake, Supplement acceptance  REASON FOR ASSESSMENT:   Consult Assessment of nutrition requirement/status  ASSESSMENT:   71 y.o. male admits related to weakness and frequent falls. PMH includes: COPD, with respiratory, PAF, CAD, GERD, HLD, hx of CVA. Pt is currently receiving medical management for Sepsis and FTT.  Meds include: lipitor, mag-ox, MVI, prednisone. Labs reviewed.   The pt reports that he does not have a great appetite but has been eating fair since admission. Per record, pt ate 75% of his breakfast this am. The pt reports that he was not eating well for 2 months PTA. No significant wt loss per record. Pt states that he was drinking Ensure shakes at home. RD will liberalize diet and add supplements to help improve intakes.   NUTRITION - FOCUSED PHYSICAL EXAM:  Flowsheet Row Most Recent Value  Orbital Region Moderate depletion  Upper Arm Region Moderate depletion  Thoracic and Lumbar Region Unable to assess  Buccal Region Moderate depletion  Temple Region Moderate depletion  Clavicle Bone Region Severe depletion  Clavicle and Acromion Bone Region Severe depletion  Scapular Bone Region Unable to assess  Dorsal Hand Moderate depletion  Patellar Region Mild depletion  Anterior Thigh Region Mild depletion  Posterior Calf Region Mild depletion  Edema (RD Assessment) None  Hair Reviewed  Eyes Reviewed  Mouth Unable to  assess  Skin Reviewed  Nails Reviewed       Diet Order:   Diet Order             Diet Heart Room service appropriate? Yes; Fluid consistency: Thin  Diet effective now                   EDUCATION NEEDS:   Not appropriate for education at this time  Skin:  Skin Assessment: Skin Integrity Issues: Skin Integrity Issues:: Incisions, Other (Comment) Incisions: right knee Other: multiple skin tears noted  Last BM:  07/27/22  Height:   Ht Readings from Last 1 Encounters:  07/27/22 '5\' 8"'$  (1.727 m)    Weight:   Wt Readings from Last 1 Encounters:  07/27/22 56.2 kg    Ideal Body Weight:     BMI:  Body mass index is 18.85 kg/m.  Estimated Nutritional Needs:   Kcal:  2202-5427 kcals  Protein:  85-100 gm  Fluid:  >/= 1.6 L  Thalia Bloodgood, RD, LDN, CNSC.

## 2022-07-28 NOTE — TOC Initial Note (Signed)
Transition of Care [facility]) - Initial/Assessment Note    Patient Details  Name: Zachary Chambers MRN: [id number] Date of Birth: 07-Jun-1951  Transition [facility]) CM/SW Contact:    Joanne Chars, LCSW Phone Number: 07/28/2022, 11:18 AM  Clinical Narrative:    CSW spoke with pt regarding PT recommendation for SNF.  Pt is not in agreement with this plan.  Pt lives with his wife Lelon Frohlich and said he wants to return home.  No current services in place.  Discussed that he will need significant support and he says that Lelon Frohlich is a Marine scientist and they can manage.  Permission given to speak with Lelon Frohlich to confirm her agreement with this plan.   CSW spoke with wife Lelon Frohlich and she states that she cannot provide adequate support for pt to return home and she does want him to go to SNF.  She will be by the hospital this afternoon and will discuss with pt.                  Expected Discharge Plan: Skilled Nursing Facility Barriers to Discharge: Continued Medical Work up, SNF Pending bed offer   Patient Goals and CMS Choice Patient states their goals for this hospitalization and ongoing recovery are:: do basic things      Expected Discharge Plan and Services Expected Discharge Plan: [place] In-house Referral: Clinical Social Work   Post Acute Care Choice:  (TBD) Living arrangements for the past 2 months: Single Family Home                                      Prior Living Arrangements/Services Living arrangements for the past 2 months: Single Family Home Lives with:: Spouse Patient language and need for interpreter reviewed:: Yes Do you feel safe going back to the place where you live?: Yes      Need for Family Participation in Patient Care: Yes (Comment) Care giver support system in place?: Yes (comment) Current home services: Other (comment) (none) Criminal Activity/Legal Involvement Pertinent to Current Situation/Hospitalization: No - Comment as needed  Activities of Daily  Living Home Assistive Devices/Equipment: None ADL Screening (condition at time of admission) Patient's cognitive ability adequate to safely complete daily activities?: No Is the patient deaf or have difficulty hearing?: No Does the patient have difficulty seeing, even when wearing glasses/contacts?: No Does the patient have difficulty concentrating, remembering, or making decisions?: No Patient able to express need for assistance with ADLs?: Yes Does the patient have difficulty dressing or bathing?: Yes Independently performs ADLs?: Yes (appropriate for developmental age) Does the patient have difficulty walking or climbing stairs?: Yes Weakness of Legs: Both Weakness of Arms/Hands: None  Permission Sought/Granted Permission sought to share information with : Family Supports Permission granted to share information with : Yes, Verbal Permission Granted  Share Information with NAME: [name]           Emotional Assessment Appearance:: Appears stated age Attitude/Demeanor/Rapport: Engaged Affect (typically observed): Appropriate, Pleasant Orientation: : Oriented to Self, Oriented to Place, Oriented to  Time, Oriented to Situation      Admission diagnosis:  Confusion [R41.0] SOB (shortness of breath) [R06.02] COPD with acute exacerbation ([place]) [J44.1] Fall, initial encounter [W19.XXXA] Sepsis ([place]) [A41.9] Fever, unspecified fever cause [R50.9] Patient Active Problem List   Diagnosis Date Noted   Sepsis ([facility]) 07/27/2022   FTT (failure to thrive) in adult with weakness  and frequent falls 07/27/2022   multiple skin tears from falls and early sacral pressure ulcer 07/27/2022   Cognitive impairment 11/05/2021   PSVT (paroxysmal supraventricular tachycardia) 11/04/2021   Primary hypertension    Hypokalemia 03/12/2021   Anxiety 10/07/2020   History of CVA with residual deficit 10/07/2020   Chronic respiratory failure with hypoxia and hypercapnia (HCC) - 2 L/min at rest, 5 L/min  with activity, baseline POC2 50 mmHg 07/05/2020   PAF (paroxysmal atrial fibrillation) ([place]) 07/05/2020   Dyspnea 07/04/2020   Protein-calorie malnutrition, severe 03/07/2020   S/P coronary artery stent placement    Hyperlipidemia 03/06/2020   Heavy alcohol use 03/04/2018   Memory changes 03/04/2018   Bruit 01/22/2014   COPD (chronic obstructive pulmonary disease) ([place]) 01/14/2011   CAD (coronary artery disease) 01/14/2011   GERD (gastroesophageal reflux disease) 01/14/2011   TOBACCO ABUSE 07/25/2009   Osteoarthritis 07/25/2009   ROTATOR CUFF SYNDROME, RIGHT 07/25/2009   PCP:  [name], MD Pharmacy:   [place], [place] - [address] [place] [address] Phone: [phone] Fax: [phone]  [facility] - [place], [place] [address] Phone: [phone] Fax: [phone]     Social Determinants of Health (SDOH) Interventions    Readmission Risk Interventions    03/13/2021    1:35 PM  Readmission Risk Prevention Plan  Transportation Screening Complete  Home Care Screening Complete  Medication Review (RN CM) Complete

## 2022-07-28 NOTE — Evaluation (Signed)
Occupational Therapy Evaluation Patient Details Name: Zachary Chambers MRN: [id number] DOB: 08-15-1951 Today's Date: 07/28/2022   History of Present Illness 71 yo male presents to [facility] on 12/11 with falls, weakness. Admitted for possible urosepsis. PMH includes  COPD with chronic respiratory failure on 4-5L oxygen [place], PAF, CAD, GERD, HLD, hx of CVA, depression and anxiety, cognitive impairment, heavy alcohol use.   Clinical Impression   Pt admitted as above presenting with deficits as listed below (please refer to OT problem list). Pt was seen for OT assessment followed by SPT to 3:1. Pt with loose bowels in bed and required MinA +2 for safety and sequencing SPT to 3:1. O2 on 2L nasal canula was 96-98% prior to mobility and quickly decreased to 87% with SPT, SPO2 was increased to 3-4 L via [place] to increase O2 back to 95% while sitting on 3:1. DOE 3/4 with activity noted. Pt is Zachary Chambers +2 for toileting hygiene and appears unaware of current deficits and limitations. Will follow acutely for OT to assist in maximizing independence with ADL's and self care tasks. Recommend SNF rehab following this in-pt stay.      Recommendations for follow up therapy are one component of Chambers multi-disciplinary discharge planning process, led by the attending physician.  Recommendations may be updated based on patient status, additional functional criteria and insurance authorization.   Follow Up Recommendations  Skilled nursing-short term rehab (<3 hours/day)     Assistance Recommended at Discharge Frequent or constant Supervision/Assistance  Patient can return home with the following Chambers lot of help with walking and/or transfers;Chambers lot of help with bathing/dressing/bathroom;Assistance with cooking/housework;Help with stairs or ramp for entrance;Assist for transportation    Functional Status Assessment  Patient has had Chambers recent decline in their functional status and demonstrates the ability to make significant improvements in  function in Chambers reasonable and predictable amount of time.  Equipment Recommendations  Other (comment) (Defer to next venue)    Recommendations for Other Services       Precautions / Restrictions Precautions Precautions: Fall Restrictions Weight Bearing Restrictions: No      Mobility Bed Mobility Overal bed mobility: Needs Assistance Bed Mobility: Supine to Sit, Sit to Supine     Supine to sit: Min assist, HOB elevated Sit to supine: Min assist, HOB elevated   General bed mobility comments: Verbal cues as well as physical assist for truncal management, scooting back in bed once returned to supine    Transfers Overall transfer level: Needs assistance Equipment used: Rolling walker (2 wheels) Transfers: Sit to/from Stand, Bed to chair/wheelchair/BSC Sit to Stand: Mod assist, From elevated surface Stand pivot transfers: Mod assist, +2 physical assistance, +2 safety/equipment, From elevated surface         General transfer comment: assist for rise and steady, requires bilat UE support for transfer EOB to 3:1. Overall decreased activity tolerance and dyspena limiting functional mobility at this time      Balance Overall balance assessment: Needs assistance, History of Falls Sitting-balance support: No upper extremity supported, Feet supported, Bilateral upper extremity supported Sitting balance-Leahy Scale: Fair     Standing balance support: Bilateral upper extremity supported, During functional activity, Reliant on assistive device for balance Standing balance-Leahy Scale: Poor     ADL either performed or assessed with clinical judgement   ADL Overall ADL's : Needs assistance/impaired Eating/Feeding: Independent;Bed level   Grooming: Bed level;Sitting;Min guard;Set up   Upper Body Bathing: Minimal assistance;Sitting   Lower Body Bathing: Moderate assistance;+2 for physical  assistance;+2 for safety/equipment;Sit to/from stand;Sitting/lateral leans   Upper Body  Dressing : Sitting;Minimal assistance   Lower Body Dressing: Moderate assistance;+2 for physical assistance;+2 for safety/equipment;Sit to/from stand;Sitting/lateral leans   Toilet Transfer: +2 for safety/equipment;+2 for physical assistance;BSC/3in1;Stand-pivot;Rolling walker (2 wheels);Cueing for sequencing;Cueing for safety;Minimal assistance   Toileting- Clothing Manipulation and Hygiene: Maximal assistance;+2 for physical assistance;+2 for safety/equipment;Sit to/from stand;Cueing for sequencing;Cueing for safety     Tub/Shower Transfer Details (indicate cue type and reason): TBD Functional mobility during ADLs: Moderate assistance;+2 for physical assistance;+2 for safety/equipment;Rolling walker (2 wheels);Cueing for safety;Cueing for sequencing General ADL Comments:  Pt was seen for OT assessment followed by SPT to 3:1. Pt with loose bowels in bed and required MinA +2 for safety and sequencing SPT to 3:1. O2 on 2L nasal canula was 96-98% prior to mobility and quickly decreased to 87% with SPT, SPO2 was increased to 3-4 L via [place] to increase O2 back to 95% while sitting on 3:1. DOE 3/4 with activity noted. Pt is Osamu Chambers +2 for toileting hygiene and appears unaware of current deficits and limitations. Will follow acutely for OT to assist in maximizing independence with ADL's and self care tasks. Recommend SNF rehab following this in-pt stay.     Vision Baseline Vision/History: 1 Wears glasses Patient Visual Report: No change from baseline              Pertinent Vitals/Pain Pain Assessment Pain Assessment: No/denies pain     Hand Dominance Right   Extremity/Trunk Assessment Upper Extremity Assessment Upper Extremity Assessment: RUE deficits/detail;Generalized weakness RUE Deficits / Details: Pt with old rotator cuff injury at baseline limiting full active ROM at end range shoulder flexion and ABD. He is however functional using Left UE to assist for ADL pruposes   Lower Extremity  Assessment Lower Extremity Assessment: Defer to PT evaluation   Cervical / Trunk Assessment Cervical / Trunk Assessment: Normal   Communication Communication Communication: No difficulties   Cognition Arousal/Alertness: Awake/alert Behavior During Therapy: WFL for tasks assessed/performed Overall Cognitive Status: History of cognitive impairments - at baseline   General Comments: documented history of cognitive impairment, pleasant and follows one-step commands with increased time     General Comments  DOE 3/4 during SPT EOB to 3:1 and when returning to bad, on 2L O2 and SPO2 was initially 96-98% then decreased to 87%. O2 increased to 3-4 LO2 for recovery to 95%. Pt placed back on 2LO2 when recovered.            Home Living Family/patient expects to be discharged to:: Private residence Living Arrangements: Spouse/significant other Available Help at Discharge: Family;Available PRN/intermittently Type of Home: House Home Access: Stairs to enter;Ramped entrance Entrance Stairs-Number of Steps: Pt has ramped entrance w/ 1 STE Entrance Stairs-Rails: Right Home Layout: Two level;Able to live on main level with bedroom/bathroom     Bathroom Shower/Tub: Teacher, early years/pre: Standard     Home Equipment: Conservation officer, nature (2 wheels);Cane - single point;Shower seat;Hand held shower head;Grab bars - toilet;Grab bars - tub/shower   Additional Comments: takes sponge bathes PRN      Prior Functioning/Environment Prior Level of Function : Needs assist;History of Falls (last six months);Patient poor historian/Family not available       Mobility Comments: pt reports walking with Chambers RW normally but "I haven't done much of it" ADLs Comments: Pt states his wife helps him with "everything", including dressing and bathing        OT Problem List: Impaired  balance (sitting and/or standing);Decreased knowledge of precautions;Decreased safety awareness;Decreased activity  tolerance;Decreased knowledge of use of DME or AE      OT Treatment/Interventions: Self-care/ADL training;Patient/family education;Energy conservation;Therapeutic activities;DME and/or AE instruction    OT Goals(Current goals can be found in the care plan section) Acute Rehab OT Goals Patient Stated Goal: None stated OT Goal Formulation: With patient Time For Goal Achievement: 08/11/22 Potential to Achieve Goals: Fair  OT Frequency: Min 2X/week       AM-PAC OT "6 Clicks" Daily Activity     Outcome Measure Help from another person eating meals?: None Help from another person taking care of personal grooming?: Chambers Little Help from another person toileting, which includes using toliet, bedpan, or urinal?: Chambers Lot Help from another person bathing (including washing, rinsing, drying)?: Chambers Lot Help from another person to put on and taking off regular upper body clothing?: Chambers Little Help from another person to put on and taking off regular lower body clothing?: Total 6 Click Score: 15   End of Session Equipment Utilized During Treatment: Gait belt;Rolling walker (2 wheels);Oxygen Nurse Communication: Mobility status;Other (comment) (Pt requesting coke)  Activity Tolerance: Other (comment) (Pt limited by DOE 3/4, h/o falls and decreased activity tolerance, decreased awareness of deficits) Patient left: in bed;with call bell/phone within reach;with chair alarm set  OT Visit Diagnosis: Repeated falls (R29.6);Unsteadiness on feet (R26.81);Adult, failure to thrive (R62.7)                Time: 1448-1856 OT Time Calculation (min): 43 min Charges:  OT General Charges $OT Visit: 1 Visit OT Evaluation $OT Eval Low Complexity: 1 Low OT Treatments $Self Care/Home Management : 8-22 mins  Kajsa Butrum Beth Dixon, OTR/L 07/28/2022, 9:09 AM

## 2022-07-28 NOTE — Progress Notes (Addendum)
PROGRESS NOTE    Zachary Chambers  OHY:[id number] DOB: 1951-06-07 DOA: 07/27/2022  PCP: [name], MD   Brief Narrative:  This 71 years old male with PMH significant for COPD with chronic hypoxic respiratory failure on 4 to 5 L of supplemental oxygen via nasal cannula, paroxysmal A-fib, CAD, GERD, HLD, history of CVA, depression and anxiety, cognitive impairment, heavy alcohol use presented to the ED with c/o:  generalized weakness, multiple falls and deconditioning.  Patient reports symptoms started 4 days ago,  he has been stumbling and his wife had to [place] assist him with walking.  He has fallen several times in the bathroom recently.  He also reports having fever 2 to 3 days ago but this was subjective.  Patient also reported having sore throat, sinus issues and worsening cough.  Workup in the ED CT head no acute findings.  Xray pelvis unremarkable.  Patient was started on IV antibiotics  and admitted for sepsis secondary to UTI.  Assessment & Plan:   Principal Problem:   Sepsis ([place]) Active Problems:   FTT (failure to thrive) in adult with weakness and frequent falls   multiple skin tears from falls and early sacral pressure ulcer   COPD (chronic obstructive pulmonary disease) (HCC)   Chronic respiratory failure with hypoxia and hypercapnia (HCC) - 2 L/min at rest, 5 L/min with activity, baseline POC2 50 mmHg   PAF (paroxysmal atrial fibrillation) (HCC)   CAD (coronary artery disease)   History of CVA with residual deficit   GERD (gastroesophageal reflux disease)   Hyperlipidemia   Heavy alcohol use   Cognitive impairment  Sepsis secondary to UTI: Patient presented with progressive debility / weakness / failure to thrive, frequent falls. He is found to have temp of 101.1, HR 102, RR 20, WBC 25.2, UA+.  Lactic acid 1.2 Chest x-ray: no acute finding but findings concerning for COPD. Possible urosepsis.  UA appears infected.  Follow-up urine culture. Continue empiric antibiotics  Cefepime and Zithromax. Follow-up blood and urine cultures. COVID, Influenza negative, follow-up respiratory viral panel. Continue IV fluid resuscitation.  Failure to thrive:  Generalized weakness and frequent falls: Patient has end-stage COPD and poor p.o. intake with weakness, frailty and frequent falls. PT/OT/ Nutrition consult. CT head and other imaging negative for acute fracture or bleed on Eliquis. Consider palliative care consult with end-stage COPD and failure to thrive  Multiple skin tears from falls and early sacral pressure ulcer: Wound Care consult. Maximize nutrition. Frequent posture change.   COPD: Patient has end-stage COPD on chronic prednisone 20 mg daily and chronic oxygen. Patient has increased shortness of breath but no wheezing noted on exam. Continue empiric antibiotics ( Cefepime and Zithromax ). Follow-up sputum culture.Continue prednisone 20 mg daily. Continue scheduled and as needed bronchodilators.   Continue Xopenex as needed, Benztri , Mucinex twice daily.  Chronic hypoxic and hypercapnic respiratory failure: Stable.  Continue supplemental oxygen.   Paroxysmal A-fib: Heart rate is well-controlled. Continue eliquis BID and metoprolol    Coronary artery disease: MI 2002 s/p PCI to LCx 2002 with staged PCI to LAD 200  No longer on ASA due to high dose prednisone. Continue Eliquis, statin, Imdur and Lopressor   History of CVA with residual deficits. Stable with no neuro deficits.   CT head: No acute findings    GERD Continue pantoprazole.  Hyperlipidemia Continue Lipitor.  Heavy Alcohol use: States he is no longer drinking-stopped 2 years ago  Ethanol pending    Cognitive impairment: Per  wife due to lack of oxygen back in march of 2023 At baseline.    DVT prophylaxis: Eliquis Code Status: Full code Family Communication: No family at bed side Disposition Plan:    Status is: Observation The patient remains OBS appropriate and  will d/c before 2 midnights.  Admitted for sepsis secondary to urinary tract infection.  Follow-up cultures.   Consultants:  None  Procedures: None  Antimicrobials:  Anti-infectives (From admission, onward)    Start     Dose/Rate Route Frequency Ordered Stop   07/27/22 1100  ceFEPIme (MAXIPIME) 2 g in sodium chloride 0.9 % 100 mL IVPB        2 g 200 mL/hr over 30 Minutes Intravenous Every 12 hours 07/27/22 1034 08/01/22 1059   07/27/22 0730  azithromycin (ZITHROMAX) 500 mg in sodium chloride 0.9 % 250 mL IVPB        500 mg 250 mL/hr over 60 Minutes Intravenous  Once 07/27/22 0722 07/27/22 1003   07/27/22 0645  cefTRIAXone (ROCEPHIN) 2 g in sodium chloride 0.9 % 100 mL IVPB        2 g 200 mL/hr over 30 Minutes Intravenous  Once 07/27/22 6789 07/27/22 0834      Subjective: Patient was seen and examined at bedside.  Overnight events noted. Patient appears very deconditioned.  Denies any specific symptoms.   Objective: Vitals:   07/28/22 0901 07/28/22 0903 07/28/22 1000 07/28/22 1313  BP:      Pulse:      Resp:   19   Temp:      TempSrc:      SpO2: 95% 94%  97%  Weight:      Height:        Intake/Output Summary (Last 24 hours) at 07/28/2022 1336 Last data filed at 07/28/2022 0809 Gross per 24 hour  Intake 2035.48 ml  Output 900 ml  Net 1135.48 ml   Filed Weights   07/27/22 1000  Weight: 56.2 kg    Examination:  General exam: Appears comfortable, not in any acute distress.  Very deconditioned Respiratory system: CTA bilaterally, respiratory effort normal, no accessory muscle use, RR 13. Cardiovascular system: S1 & S2 heard, regular rate and rhythm, no murmur. Gastrointestinal system: Abdomen is soft, non tender, non distended, BS+ Central nervous system: Alert and oriented x 3. No focal neurological deficits. Extremities: No edema, no cyanosis, no clubbing Skin: No rashes, lesions or ulcers Psychiatry: Judgement and insight appear normal. Mood & affect  appropriate.     Data Reviewed: I have personally reviewed following labs and imaging studies  CBC: Recent Labs  Lab 07/27/22 0634 07/27/22 0659 07/28/22 0415  WBC 25.2*  --  17.8*  NEUTROABS 23.7*  --   --   HGB 14.8 16.0 10.9*  HCT 47.7 47.0 35.3*  MCV 86.4  --  86.7  PLT 419*  --  381   Basic Metabolic Panel: Recent Labs  Lab 07/27/22 0634 07/27/22 0659 07/28/22 0415  NA 140 139 140  K 4.4 4.2 4.4  CL 99  --  104  CO2 29  --  29  GLUCOSE 109*  --  125*  BUN 26*  --  25*  CREATININE 1.18  --  1.03  CALCIUM 9.8  --  8.4*   GFR: Estimated Creatinine Clearance: 50.8 mL/min (by C-G formula based on SCr of 1.03 mg/dL). Liver Function Tests: Recent Labs  Lab 07/27/22 0634  AST 42*  ALT 23  ALKPHOS 71  BILITOT 0.8  PROT  7.7  ALBUMIN 4.6   No results for input(s): "LIPASE", "AMYLASE" in the last 168 hours. No results for input(s): "AMMONIA" in the last 168 hours. Coagulation Profile: Recent Labs  Lab 07/27/22 0634  INR 1.5*   Cardiac Enzymes: No results for input(s): "CKTOTAL", "CKMB", "CKMBINDEX", "TROPONINI" in the last 168 hours. BNP (last 3 results) No results for input(s): "PROBNP" in the last 8760 hours. HbA1C: No results for input(s): "HGBA1C" in the last 72 hours. CBG: Recent Labs  Lab 07/27/22 2008  GLUCAP 172*   Lipid Profile: No results for input(s): "CHOL", "HDL", "LDLCALC", "TRIG", "CHOLHDL", "LDLDIRECT" in the last 72 hours. Thyroid Function Tests: No results for input(s): "TSH", "T4TOTAL", "FREET4", "T3FREE", "THYROIDAB" in the last 72 hours. Anemia Panel: No results for input(s): "VITAMINB12", "FOLATE", "FERRITIN", "TIBC", "IRON", "RETICCTPCT" in the last 72 hours. Sepsis Labs: Recent Labs  Lab 07/27/22 3016 07/27/22 0834  LATICACIDVEN 1.6 1.2    Recent Results (from the past 240 hour(s))  Resp Panel by RT-PCR (Flu A&B, Covid) Anterior Nasal Swab     Status: None   Collection Time: 07/27/22  6:34 AM   Specimen: Anterior  Nasal Swab  Result Value Ref Range Status   SARS Coronavirus 2 by RT PCR NEGATIVE NEGATIVE Final    Comment: (NOTE) SARS-CoV-2 target nucleic acids are NOT DETECTED.  The SARS-CoV-2 RNA is generally detectable in upper respiratory specimens during the acute phase of infection. The lowest concentration of SARS-CoV-2 viral copies this assay can detect is 138 copies/mL. A negative result does not preclude SARS-Cov-2 infection and should not be used as the sole basis for treatment or other patient management decisions. A negative result may occur with  improper specimen collection/handling, submission of specimen other than nasopharyngeal swab, presence of viral mutation(s) within the areas targeted by this assay, and inadequate number of viral copies(<138 copies/mL). A negative result must be combined with clinical observations, patient history, and epidemiological information. The expected result is Negative.  Fact Sheet for Patients:  EntrepreneurPulse.com.au  Fact Sheet for Healthcare Providers:  IncredibleEmployment.be  This test is no t yet approved or cleared by the [place] FDA and  has been authorized for detection and/or diagnosis of SARS-CoV-2 by FDA under an Emergency Use Authorization (EUA). This EUA will remain  in effect (meaning this test can be used) for the duration of the COVID-19 declaration under Section 564(b)(1) of the Act, 21 U.S.C.section 360bbb-3(b)(1), unless the authorization is terminated  or revoked sooner.       Influenza A by PCR NEGATIVE NEGATIVE Final   Influenza B by PCR NEGATIVE NEGATIVE Final    Comment: (NOTE) The Xpert Xpress SARS-CoV-2/FLU/RSV plus assay is intended as an aid in the diagnosis of influenza from Nasopharyngeal swab specimens and should not be used as a sole basis for treatment. Nasal washings and aspirates are unacceptable for Xpert Xpress SARS-CoV-2/FLU/RSV testing.  Fact Sheet for  Patients: EntrepreneurPulse.com.au  Fact Sheet for Healthcare Providers: IncredibleEmployment.be  This test is not yet approved or cleared by the [place] FDA and has been authorized for detection and/or diagnosis of SARS-CoV-2 by FDA under an Emergency Use Authorization (EUA). This EUA will remain in effect (meaning this test can be used) for the duration of the COVID-19 declaration under Section 564(b)(1) of the Act, 21 U.S.C. section 360bbb-3(b)(1), unless the authorization is terminated or revoked.  Performed at [facility], [place] [address], [place], [place] [address]   Blood culture (routine x 2)  Status: None (Preliminary result)   Collection Time: 07/27/22  7:05 AM   Specimen: BLOOD  Result Value Ref Range Status   Specimen Description BLOOD SITE NOT SPECIFIED  Final   Special Requests   Final    BOTTLES DRAWN AEROBIC AND ANAEROBIC Blood Culture adequate volume   Culture   Final    NO GROWTH < 12 HOURS Performed at [facility], [address] [place], [place] [address]    Report Status PENDING  Incomplete  Respiratory (~20 pathogens) panel by PCR     Status: None   Collection Time: 07/27/22 12:44 PM   Specimen: Nasopharyngeal Swab; Respiratory  Result Value Ref Range Status   Adenovirus NOT DETECTED NOT DETECTED Final   Coronavirus 229E NOT DETECTED NOT DETECTED Final    Comment: (NOTE) The Coronavirus on the Respiratory Panel, DOES NOT test for the novel  Coronavirus (2019 nCoV)    Coronavirus HKU1 NOT DETECTED NOT DETECTED Final   Coronavirus NL63 NOT DETECTED NOT DETECTED Final   Coronavirus OC43 NOT DETECTED NOT DETECTED Final   Metapneumovirus NOT DETECTED NOT DETECTED Final   Rhinovirus / Enterovirus NOT DETECTED NOT DETECTED Final   Influenza A NOT DETECTED NOT DETECTED Final   Influenza B NOT DETECTED NOT DETECTED Final   Parainfluenza Virus 1 NOT DETECTED NOT DETECTED Final   Parainfluenza Virus 2  NOT DETECTED NOT DETECTED Final   Parainfluenza Virus 3 NOT DETECTED NOT DETECTED Final   Parainfluenza Virus 4 NOT DETECTED NOT DETECTED Final   Respiratory Syncytial Virus NOT DETECTED NOT DETECTED Final   Bordetella pertussis NOT DETECTED NOT DETECTED Final   Bordetella Parapertussis NOT DETECTED NOT DETECTED Final   Chlamydophila pneumoniae NOT DETECTED NOT DETECTED Final   Mycoplasma pneumoniae NOT DETECTED NOT DETECTED Final    Comment: Performed at [facility], [place]. [address] [place], [place] [address]         Radiology Studies: DG Elbow Complete Left  Result Date: 07/27/2022 CLINICAL DATA:  Fall on blood thinners. EXAM: LEFT ELBOW - COMPLETE 3 VIEW COMPARISON:  None Available. FINDINGS: Oblique lateral view. No visible joint effusion, fracture, or dislocation. Generalized osteopenia. IMPRESSION: No acute finding. Electronically Signed   By: Jorje Guild M.D.   On: 07/27/2022 06:52   DG Forearm Left  Result Date: 07/27/2022 CLINICAL DATA:  Fall on blood thinners EXAM: LEFT FOREARM - 2 VIEW COMPARISON:  None Available. FINDINGS: There is no evidence of fracture or other focal bone lesions. 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 Osteopenia and arterial calcification. IMPRESSION: No acute finding. Electronically Signed   By: Jorje Guild M.D.   On: 07/27/2022 06:51   DG Knee Complete 4 Views Right  Result Date: 07/27/2022 CLINICAL DATA:  Fall on blood thinners EXAM: RIGHT KNEE - COMPLETE 4 VIEW COMPARISON:  10/20/2017 FINDINGS: A true lateral view was not obtained, a notable limitation. No evidence of fracture, dislocation, or visible joint effusion. Tricompartmental degenerative spurring. Atheromatous calcification. IMPRESSION: Limited study without acute finding. Osteoarthritis. Electronically Signed   By: Jorje Guild M.D.   On: 07/27/2022 06:50   DG Pelvis Portable  Result Date: 07/27/2022 CLINICAL DATA:  Fall on blood thinners  EXAM: PORTABLE PELVIS 1 VIEWS COMPARISON:  None Available. FINDINGS: Limited pelvis due to rotation, osteopenia, and hardware overlap. No fracture or dislocation seen. Lumbar spine degeneration. Generalized stool retention. IMPRESSION: 1. Limited study without acute finding. 2. Generalized stool retention. Electronically Signed   By: Jorje Guild  M.D.   On: 07/27/2022 06:49   DG Chest Port 1 View  Result Date: 07/27/2022 CLINICAL DATA:  Fall on blood thinners EXAM: PORTABLE CHEST 1 VIEW COMPARISON:  06/06/2022 FINDINGS: Chronic scar-like reticulation in the lower lungs. There is emphysema confirmed by CT. There is no edema, consolidation, effusion, or pneumothorax. Normal heart size and mediastinal contours. Coronary stenting. No visible fracture. IMPRESSION: COPD without acute superimposed finding. Electronically Signed   By: Jorje Guild M.D.   On: 07/27/2022 06:48   CT HEAD WO CONTRAST (5MM)  Result Date: 07/27/2022 CLINICAL DATA:  Facial trauma, blunt.  Level 2 trauma. EXAM: CT HEAD WITHOUT CONTRAST CT CERVICAL SPINE WITHOUT CONTRAST TECHNIQUE: Multidetector CT imaging of the head and cervical spine was performed following the standard protocol without intravenous contrast. Multiplanar CT image reconstructions of the cervical spine were also generated. RADIATION DOSE REDUCTION: This exam was performed according to the departmental dose-optimization program which includes automated exposure control, adjustment of the mA and/or kV according to patient size and/or use of iterative reconstruction technique. COMPARISON:  04/28/2021 FINDINGS: CT HEAD FINDINGS Brain: No evidence of acute infarction, hemorrhage, hydrocephalus, extra-axial collection or mass lesion/mass effect. Remote right frontal parietal infarct which is cortically based. Chronic small vessel ischemia in the cerebral white matter. Generalized brain atrophy. Vascular: No hyperdense vessel or unexpected calcification. Skull: Negative for  fracture Sinuses/Orbits: Right enucleation and prosthesis.  No visible injury CT CERVICAL SPINE FINDINGS Alignment: No traumatic malalignment. Degenerative anterolisthesis at C7-T1. Skull base and vertebrae: No acute fracture. No primary bone lesion or focal pathologic process. Soft tissues and spinal canal: No prevertebral fluid or swelling. No visible canal hematoma. Disc levels: Generalized disc space narrowing with endplate ridging. Prominent facet osteoarthritis at C7-T1. C3-4 ankylosis. Upper chest: Emphysema IMPRESSION: No evidence of acute intracranial or cervical spine injury. Electronically Signed   By: Jorje Guild M.D.   On: 07/27/2022 06:32   CT Cervical Spine Wo Contrast  Result Date: 07/27/2022 CLINICAL DATA:  Facial trauma, blunt.  Level 2 trauma. EXAM: CT HEAD WITHOUT CONTRAST CT CERVICAL SPINE WITHOUT CONTRAST TECHNIQUE: Multidetector CT imaging of the head and cervical spine was performed following the standard protocol without intravenous contrast. Multiplanar CT image reconstructions of the cervical spine were also generated. RADIATION DOSE REDUCTION: This exam was performed according to the departmental dose-optimization program which includes automated exposure control, adjustment of the mA and/or kV according to patient size and/or use of iterative reconstruction technique. COMPARISON:  04/28/2021 FINDINGS: CT HEAD FINDINGS Brain: No evidence of acute infarction, hemorrhage, hydrocephalus, extra-axial collection or mass lesion/mass effect. Remote right frontal parietal infarct which is cortically based. Chronic small vessel ischemia in the cerebral white matter. Generalized brain atrophy. Vascular: No hyperdense vessel or unexpected calcification. Skull: Negative for fracture Sinuses/Orbits: Right enucleation and prosthesis.  No visible injury CT CERVICAL SPINE FINDINGS Alignment: No traumatic malalignment. Degenerative anterolisthesis at C7-T1. Skull base and vertebrae: No acute  fracture. No primary bone lesion or focal pathologic process. Soft tissues and spinal canal: No prevertebral fluid or swelling. No visible canal hematoma. Disc levels: Generalized disc space narrowing with endplate ridging. Prominent facet osteoarthritis at C7-T1. C3-4 ankylosis. Upper chest: Emphysema IMPRESSION: No evidence of acute intracranial or cervical spine injury. Electronically Signed   By: Jorje Guild M.D.   On: 07/27/2022 06:32     Scheduled Meds:  apixaban  5 mg Oral BID   atorvastatin  40 mg Oral QHS   fluticasone  2 spray Each Nare Daily  guaiFENesin  600 mg Oral BID   ipratropium-albuterol  3 mL Nebulization TID   isosorbide mononitrate  15 mg Oral Daily   magnesium oxide  400 mg Oral Daily   metoprolol tartrate  50 mg Oral BID   mometasone-formoterol  2 puff Inhalation BID   multivitamin with minerals  1 tablet Oral Daily   pantoprazole  80 mg Oral Daily   predniSONE  20 mg Oral Q breakfast   QUEtiapine  25 mg Oral QHS   Continuous Infusions:  ceFEPime (MAXIPIME) IV 2 g (07/28/22 1056)     LOS: 0 days    Time spent: 50 mins    Delynda Sepulveda, MD [facility]   If 7PM-7AM, please contact night-coverage

## 2022-07-29 DIAGNOSIS — I480 Paroxysmal atrial fibrillation: Secondary | ICD-10-CM | POA: Diagnosis not present

## 2022-07-29 DIAGNOSIS — A4150 Gram-negative sepsis, unspecified: Secondary | ICD-10-CM

## 2022-07-29 DIAGNOSIS — R627 Adult failure to thrive: Secondary | ICD-10-CM

## 2022-07-29 DIAGNOSIS — E440 Moderate protein-calorie malnutrition: Secondary | ICD-10-CM | POA: Diagnosis present

## 2022-07-29 DIAGNOSIS — S51019A Laceration without foreign body of unspecified elbow, initial encounter: Secondary | ICD-10-CM

## 2022-07-29 DIAGNOSIS — I2510 Atherosclerotic heart disease of native coronary artery without angina pectoris: Secondary | ICD-10-CM

## 2022-07-29 DIAGNOSIS — N390 Urinary tract infection, site not specified: Secondary | ICD-10-CM

## 2022-07-29 LAB — CBC
HCT: 35.5 % — ABNORMAL LOW (ref 39.0–52.0)
Hemoglobin: 11.3 g/dL — ABNORMAL LOW (ref 13.0–17.0)
MCH: 26.9 pg (ref 26.0–34.0)
MCHC: 31.8 g/dL (ref 30.0–36.0)
MCV: 84.5 fL (ref 80.0–100.0)
Platelets: 343 10*3/uL (ref 150–400)
RBC: 4.2 MIL/uL — ABNORMAL LOW (ref 4.22–5.81)
RDW: 21.2 % — ABNORMAL HIGH (ref 11.5–15.5)
WBC: 14.4 10*3/uL — ABNORMAL HIGH (ref 4.0–10.5)
nRBC: 0 % (ref 0.0–0.2)

## 2022-07-29 LAB — URINE CULTURE: Culture: <10000 — AB

## 2022-07-29 LAB — BASIC METABOLIC PANEL
Anion gap: 11 (ref 5–15)
BUN: 22 mg/dL (ref 8–23)
CO2: 28 mmol/L (ref 22–32)
Calcium: 8.8 mg/dL — ABNORMAL LOW (ref 8.9–10.3)
Chloride: 100 mmol/L (ref 98–111)
Creatinine, Ser: 0.92 mg/dL (ref 0.61–1.24)
GFR, Estimated: >60 mL/min (ref >60)
Glucose, Bld: 89 mg/dL (ref 70–99)
Potassium: 4 mmol/L (ref 3.5–5.1)
Sodium: 139 mmol/L (ref 135–145)

## 2022-07-29 LAB — PHOSPHORUS: Phosphorus: 2 mg/dL — ABNORMAL LOW (ref 2.5–4.6)

## 2022-07-29 LAB — MAGNESIUM: Magnesium: 2.1 mg/dL (ref 1.7–2.4)

## 2022-07-29 MED ORDER — PREDNISONE 10 MG PO TABS
10.0000 mg | ORAL_TABLET | Freq: Every day | ORAL | Status: DC
  Administered 2022-07-30 – 2022-07-31 (×2): 10 mg via ORAL
  Filled 2022-07-29 (×2): qty 1

## 2022-07-29 MED ORDER — ORAL CARE MOUTH RINSE: 15.0000 mL | OROMUCOSAL | Status: DC | PRN

## 2022-07-29 MED ORDER — CEPHALEXIN 500 MG PO CAPS: 500.0000 mg | ORAL_CAPSULE | Freq: Two times a day (BID) | ORAL | Status: DC

## 2022-07-29 MED ORDER — CEPHALEXIN 500 MG PO CAPS
500.0000 mg | ORAL_CAPSULE | Freq: Two times a day (BID) | ORAL | Status: DC
  Administered 2022-07-29 – 2022-07-31 (×4): 500 mg via ORAL
  Filled 2022-07-29 (×4): qty 1

## 2022-07-29 NOTE — Progress Notes (Signed)
Progress Note   Patient: Zachary Chambers QMG:[id number] DOB: [birth year] DOA: 07/27/2022     1 DOS: the patient was seen and examined on 07/29/2022   Brief hospital course: Zachary Chambers was admitted to the hospital with the working diagnosis of sepsis due to urine infection.   71 yo male with the past medical history of COPD, chronic hypoxemic respiratory failure, paroxysmal atrial fibrillation, dyslipidemia, history of CVA, alcohol abuse, cognitive impairment and depression who presented with weakness and frequent falls. Reported progressive symptoms for 4 days, requiring maximal assistance for ambulating. Positive subejctive fevers, cough and dyspnea with exertion. Multiple falls at home that prompted her wife to bring him to the ED. On his initial physical examination his blood pressure was 130/76, HR 102 RR 17, Temp 101.1 and 02 saturation 94% on 4 L/min per [place]. He was awake and alert, luns with no wheezing or rales, heart with S1 and S2 present and rhythmic, abdomen soft and no lower extremity edema. Bilateral anterior tibial excoriations and signs of trauma.    Na 140, K 4,4 Cl 99, bicarbonate 29, glucose 109 bun 26 cr 1,18  Wbc 25,2 hgb 14,8 plt 419  Sars covid 19 negative Influenza negative  Toxicology negative   Urine analysis with SG 1,016, protein 100, 21-50 wbc, 11-20 rbc, positive nitrites.   Head CT and cervical spine with no acute changes.  Chest radiograph with no infiltrates, positive hyperinflation.  Radiograph of upper and lower extremities, pelvis, negative for fractures.   EKG 86 bpm, normal axis, normal intervals, sinus rhythm with left atrial enlargement, no significant ST segment or  T wave changes.   Patient was placed on antibiotic therapy with good toleration.  PT and OT consulted with recommendations for short term SNF.    Assessment and Plan: * Sepsis due to gram-negative UTI [facility]) Patient has been afebrile, wbc is 14,4  Urine culture pending report.   Plan to  transition to oral antibiotic therapy with cephalexin, discontinue cefepime.  Continue close follow up cell count and temperature curve.   FTT (failure to thrive) in adult with weakness and frequent falls Patient continue to be very weak and deconditioned, PT and OT have recommended short term rehab.   multiple skin tears from falls and early sacral pressure ulcer Continue local skin care.  Out of bed to chair tid with meals, continue nutritional support and PT/ OT.   COPD (chronic obstructive pulmonary disease) (HCC) No signs of acute exacerbation, continue with bronchodilator therapy. Continue with supplemental 02 per [place] for chronic hypoxemic respiratory failure.  Decrease oral prednisone to 10 mg, slow taper.   Chronic respiratory failure with hypoxia and hypercapnia (HCC) - 2 L/min at rest, 5 L/min with activity, baseline POC2 50 mmHg Stable on home oxygen   PAF (paroxysmal atrial fibrillation) (HCC) CHADS2-VASC of 5 Continue eliquis BID and metoprolol   CAD (coronary artery disease) inferolateral MI 2002 s/p PCI to LCx 2002 with staged PCI to LAD 200  No longer on ASA due to high dose prednisone Continue eliquis, statin, imdur and lopressor   History of CVA with residual deficit Stable, no neuro deficits  CT head with no acute findings    GERD (gastroesophageal reflux disease) Continue PPI  Hyperlipidemia Continue lipitor   Heavy alcohol use States he is no longer drinking-stopped 2 years ago    Cognitive impairment Per wife due to lack of oxygen back in march of 2023 At baseline   Malnutrition of moderate degree Continue with nutritional  supplements.         Subjective: Patient with no chest pain, dyspnea at his baseline, continue to be very weak and deconditioned   Physical Exam: Vitals:   07/28/22 2052 07/28/22 2104 07/28/22 2105 07/29/22 0508  BP: 117/70   123/71  Pulse: 71   68  Resp: 16   16  Temp: 98.1 F (36.7 C)   97.8 F (36.6 C)   TempSrc: Oral   Oral  SpO2: 98% 97% 97% 96%  Weight:      Height:       Neurology awake and alert ENT with mild pallor Cardiovascular with S1 and S2 present and rhythmic with no murmurs Respiratory with prolonged expiratory phase and poor breath sounds, with no wheezing or rhonchi Abdomen with no distention  No lower extremity edema  Data Reviewed:    Family Communication: no family at the bedside   Disposition: Status is: Inpatient Remains inpatient appropriate because: pending transfer to SNF.    Planned Discharge Destination: Skilled nursing facility    Author: Tawni Millers, MD 07/29/2022 1:21 PM  For on call review [email or web address].

## 2022-07-29 NOTE — TOC Progression Note (Signed)
Transition of Care [facility]) - Initial/Assessment Note    Patient Details  Name: Zachary Chambers MRN: [id number] Date of Birth: 05-09-1951  Transition of Care [facility]) CM/SW Contact:    Zachary Chambers, LCSWA Phone Number: 07/29/2022, 10:19 AM  Clinical Narrative:                 [phone]  LCSW contacted Zachary Chambers to inquire about discussion with patient in reference to returning home vs SNF.  There was no answer.  LCSW unable to leave VM as VM box is full.   TOC will continue to follow.    Expected Discharge Plan: Skilled Nursing Facility Barriers to Discharge: Continued Medical Work up, SNF Pending bed offer   Patient Goals and CMS Choice Patient states their goals for this hospitalization and ongoing recovery are:: do basic things      Expected Discharge Plan and Services Expected Discharge Plan: Zachary Chambers In-house Referral: Clinical Social Work   Post Acute Care Choice:  (TBD) Living arrangements for the past 2 months: Single Family Home                                      Prior Living Arrangements/Services Living arrangements for the past 2 months: Single Family Home Lives with:: Spouse Patient language and need for interpreter reviewed:: Yes Do you feel safe going back to the place where you live?: Yes      Need for Family Participation in Patient Care: Yes (Comment) Care giver support system in place?: Yes (comment) Current home services: Other (comment) (none) Criminal Activity/Legal Involvement Pertinent to Current Situation/Hospitalization: No - Comment as needed  Activities of Daily Living Home Assistive Devices/Equipment: None ADL Screening (condition at time of admission) Patient's cognitive ability adequate to safely complete daily activities?: No Is the patient deaf or have difficulty hearing?: No Does the patient have difficulty seeing, even when wearing glasses/contacts?: No Does the patient have difficulty concentrating, remembering,  or making decisions?: No Patient able to express need for assistance with ADLs?: Yes Does the patient have difficulty dressing or bathing?: Yes Independently performs ADLs?: Yes (appropriate for developmental age) Does the patient have difficulty walking or climbing stairs?: Yes Weakness of Legs: Both Weakness of Arms/Hands: None  Permission Sought/Granted Permission sought to share information with : Family Supports Permission granted to share information with : Yes, Verbal Permission Granted  Share Information with NAME: wife Zachary Chambers           Emotional Assessment Appearance:: Appears stated age Attitude/Demeanor/Rapport: Engaged Affect (typically observed): Appropriate, Pleasant Orientation: : Oriented to Self, Oriented to Place, Oriented to  Time, Oriented to Situation      Admission diagnosis:  Confusion [R41.0] SOB (shortness of breath) [R06.02] COPD with acute exacerbation ([place]) [J44.1] Fall, initial encounter [W19.XXXA] Sepsis ([place]) [A41.9] Fever, unspecified fever cause [R50.9] Patient Active Problem List   Diagnosis Date Noted   Sepsis ([place]) 07/27/2022   FTT (failure to thrive) in adult with weakness and frequent falls 07/27/2022   multiple skin tears from falls and early sacral pressure ulcer 07/27/2022   Cognitive impairment 11/05/2021   PSVT (paroxysmal supraventricular tachycardia) 11/04/2021   Primary hypertension    Hypokalemia 03/12/2021   Anxiety 10/07/2020   History of CVA with residual deficit 10/07/2020   Chronic respiratory failure with hypoxia and hypercapnia (HCC) - 2 L/min at rest, 5 L/min with activity, baseline POC2 50 mmHg 07/05/2020  PAF (paroxysmal atrial fibrillation) ([place]) 07/05/2020   Dyspnea 07/04/2020   Protein-calorie malnutrition, severe 03/07/2020   S/P coronary artery stent placement    Hyperlipidemia 03/06/2020   Heavy alcohol use 03/04/2018   Memory changes 03/04/2018   Bruit 01/22/2014   COPD (chronic obstructive pulmonary  disease) ([place]) 01/14/2011   CAD (coronary artery disease) 01/14/2011   GERD (gastroesophageal reflux disease) 01/14/2011   TOBACCO ABUSE 07/25/2009   Osteoarthritis 07/25/2009   ROTATOR CUFF SYNDROME, RIGHT 07/25/2009   PCP:  Zachary Frizzle, MD Pharmacy:   [place], [place] - [address] [place] [address] Phone: [phone] Fax: [phone]  [place], [place] [address] Phone: [phone] Fax: [phone]     Social Determinants of Health ([place]) Interventions    Readmission Risk Interventions    03/13/2021    1:35 PM  Readmission Risk Prevention Plan  Transportation Screening Complete  Home Care Screening Complete  Medication Review (RN CM) Complete

## 2022-07-29 NOTE — Assessment & Plan Note (Addendum)
Sepsis present on admission.  Urine culture < 10,000  CFU  Continue antibiotic therapy with cephalexin for a total of 5 days.  Renal US with no obstruction.

## 2022-07-29 NOTE — Hospital Course (Addendum)
Mr. Ciotti was admitted to the hospital with the working diagnosis of sepsis due to urine infection.   71 yo male with the past medical history of COPD, chronic hypoxemic respiratory failure, paroxysmal atrial fibrillation, dyslipidemia, history of CVA, cognitive impairment and depression who presented with weakness and frequent falls. Reported progressive symptoms for 4 days, requiring maximal assistance for ambulating. Positive subejctive fevers, cough and dyspnea with exertion. Multiple falls at home that prompted her wife to bring him to the ED. On his initial physical examination his blood pressure was 130/76, HR 102 RR 17, Temp 101.1 and 02 saturation 94% on 4 L/min per [place]. He was awake and alert, luns with no wheezing or rales, heart with S1 and S2 present and rhythmic, abdomen soft and no lower extremity edema. Bilateral anterior tibial excoriations and signs of trauma.    Na 140, K 4,4 Cl 99, bicarbonate 29, glucose 109 bun 26 cr 1,18  Wbc 25,2 hgb 14,8 plt 419  Sars covid 19 negative Influenza negative  Toxicology negative   Urine analysis with SG 1,016, protein 100, 21-50 wbc, 11-20 rbc, positive nitrites.   Head CT and cervical spine with no acute changes.  Chest radiograph with no infiltrates, positive hyperinflation.  Radiograph of upper and lower extremities, pelvis, negative for fractures.   EKG 86 bpm, normal axis, normal intervals, sinus rhythm with left atrial enlargement, no significant ST segment or  T wave changes.   Patient was placed on antibiotic therapy with good toleration.  PT and OT consulted with recommendations for short term SNF.   12/14 Patient has agreed to be transfer to SNF, medically stable.  12./15 Patient is medically stable to be transfer to SNF, pending insurance authorization.

## 2022-07-29 NOTE — Progress Notes (Signed)
Occupational Therapy Treatment Patient Details Name: Zachary Chambers MRN: [id number] DOB: 1950/11/15 Today's Date: 07/29/2022   History of present illness 71 yo male presents to [facility] with falls, weakness. Admitted for possible urosepsis. PMH includes  COPD with chronic respiratory failure on 4-5L oxygen [place], PAF, CAD, GERD, HLD, hx of CVA, depression and anxiety, cognitive impairment, heavy alcohol use.   OT comments  Pt making incremental progress towards OT goals this session. Pt continues to present with decreased activity tolerance, generalized deconditioning and dyspnea with minimal exertion. Pt initially agreeable to sit EOB for bathing but then lays self back down, noted pt to have BM with pt initially declining transferring OOB to [facility]. After Zachary Chambers education/encouragement from both OTA and NT pt agreeable. Pt completes ADL transfers with RW and min guard assist. Pt requires MIN A for UB ADLS and Zachary Chambers A for LB ADLS. Pt declines sitting up in recliner despite Zachary Chambers educational efforts on OOB tolerance for rehab and to aid in improved breathing for cardiorespiratory output. Pt would continue to benefit from skilled occupational therapy while admitted and after d/c to address the below listed limitations in order to improve overall functional mobility and facilitate independence with BADL participation. DC plan remains appropriate, will follow acutely per POC.   pt on 2 L [place] upon arrival, SpO2 drop to 82% needing an increase in O2 to 3 L but able to recover back to 2L at end of session, HR Zachary Chambers 114 bpm     Recommendations for follow up therapy are one component of a multi-disciplinary discharge planning process, led by the attending physician.  Recommendations may be updated based on patient status, additional functional criteria and insurance authorization.    Follow Up Recommendations  Skilled nursing-short term rehab (<3 hours/day)     Assistance Recommended at Discharge Frequent or constant  Supervision/Assistance  Patient can return home with the following  A lot of help with walking and/or transfers;A lot of help with bathing/dressing/bathroom;Assistance with cooking/housework;Help with stairs or ramp for entrance;Assist for transportation   Equipment Recommendations  Other (comment) (defer to next venue of care)    Recommendations for Other Services      Precautions / Restrictions Precautions Precautions: Fall Precaution Comments: watch O2 Restrictions Weight Bearing Restrictions: No       Mobility Bed Mobility Overal bed mobility: Modified Independent       Supine to sit: Modified independent (Device/Increase time) Sit to supine: Modified independent (Device/Increase time)   General bed mobility comments: use of bed features    Transfers Overall transfer level: Needs assistance Equipment used: Rolling walker (2 wheels) Transfers: Sit to/from Stand, Bed to chair/wheelchair/BSC Sit to Stand: Min guard Stand pivot transfers: Min guard         General transfer comment: pt completed sit>stands with RW and min guard assist for safety and line mgmt. stand pivot to/from [facility] with min guard assist with RW, cues for safety as pt impulsive with movement d/t pt wanting to be done with mobility     Balance Overall balance assessment: Needs assistance, History of Falls Sitting-balance support: No upper extremity supported, Feet supported, Bilateral upper extremity supported Sitting balance-Zachary Chambers Scale: Fair     Standing balance support: Bilateral upper extremity supported, During functional activity, Reliant on assistive device for balance Standing balance-Zachary Chambers Scale: Poor                             ADL  either performed or assessed with clinical judgement   ADL Overall ADL's : Needs assistance/impaired     Grooming: Wash/dry face;Sitting;Supervision/safety;Set up Grooming Details (indicate cue type and reason): sitting EOB Upper Body Bathing:  Minimal assistance;Sitting Upper Body Bathing Details (indicate cue type and reason): to wash UB bathing from EOB Lower Body Bathing: Maximal assistance;Sit to/from stand Lower Body Bathing Details (indicate cue type and reason): to wash buttock in standing Upper Body Dressing : Sitting;Minimal assistance Upper Body Dressing Details (indicate cue type and reason): sitting EOB     Toilet Transfer: Min guard;BSC/3in1;Stand-pivot;Rolling walker (2 wheels) Toilet Transfer Details (indicate cue type and reason): stand pivot to BSC from EOB, pt initially resistant to transfer but eventually agreeable with coaxing Toileting- Clothing Manipulation and Hygiene: Maximal assistance;Sit to/from stand;Cueing for sequencing;Cueing for safety       Functional mobility during ADLs: Min guard;Rolling walker (2 wheels) General ADL Comments: pt hesistant to engage in session as he is upset about going to SNF, Mar education needed that hospital therapy was different than SNF. pt also noted to have incontinent BM but initially declines exiting bed, [place] education/cues needed for pt to get OOB to [facility] to have BM.    Extremity/Trunk Assessment Upper Extremity Assessment Upper Extremity Assessment: Generalized weakness;RUE deficits/detail RUE Deficits / Details: Pt with old rotator cuff injury at baseline limiting full active ROM at end range shoulder flexion and ABD. He is however functional using Left UE to assist for ADL pruposes   Lower Extremity Assessment Lower Extremity Assessment: Defer to PT evaluation   Cervical / Trunk Assessment Cervical / Trunk Assessment: Normal    Vision Baseline Vision/History: 1 Wears glasses Patient Visual Report: No change from baseline     Perception Perception Perception: Within Functional Limits   Praxis Praxis Praxis: Intact    Cognition Arousal/Alertness: Awake/alert Behavior During Therapy: Agitated, WFL for tasks assessed/performed (moments of  agitation) Overall Cognitive Status: History of cognitive impairments - at baseline                                          Exercises      Shoulder Instructions       General Comments pt on 2 L [place] upon arrival, SpO2 drop to 82% needing an increase in O2 to 3 L but able to recover back to 2L at end of session, HR [place] 114 bpm.pt perseverates on how much oxugen he is on, noted to be mouth breather, education on PLB.    Pertinent Vitals/ Pain       Pain Assessment Pain Assessment: Faces Faces Pain Scale: Hurts little more Pain Location: "all over with breahting" Pain Descriptors / Indicators: Discomfort, Grimacing Pain Intervention(s): Monitored during session  Home Living                                          Prior Functioning/Environment              Frequency  Min 2X/week        Progress Toward Goals  OT Goals(current goals can now be found in the care plan section)  Progress towards OT goals: Progressing toward goals (gradually and on pts own accord)  Acute Rehab OT Goals Patient Stated Goal: to go back to bed OT Goal  Formulation: With patient Time For Goal Achievement: 08/11/22 Potential to Achieve Goals: [place] Discharge plan remains appropriate;Frequency remains appropriate    Co-evaluation                 AM-PAC OT "6 Clicks" Daily Activity     Outcome Measure   Help from another person eating meals?: None Help from another person taking care of personal grooming?: A Little Help from another person toileting, which includes using toliet, bedpan, or urinal?: A Lot Help from another person bathing (including washing, rinsing, drying)?: A Lot Help from another person to put on and taking off regular upper body clothing?: A Little Help from another person to put on and taking off regular lower body clothing?: A Lot 6 Click Score: 16    End of Session Equipment Utilized During Treatment: Rolling walker  (2 wheels);Oxygen;Other (comment) (2-3L)  OT Visit Diagnosis: Repeated falls (R29.6);Unsteadiness on feet (R26.81);Adult, failure to thrive (R62.7)   Activity Tolerance Patient tolerated treatment well   Patient Left in bed;with call bell/phone within reach;with bed alarm set (pt declined chair despite Kaitlin efforts)   Nurse Communication Mobility status (NT)        Time: 7353-2992 OT Time Calculation (min): 50 min  Charges: OT General Charges $OT Visit: 1 Visit OT Treatments $Self Care/Home Management : 38-52 mins Harley [place]., COTA/L Acute Rehabilitation Services [phone]   Precious Haws 07/29/2022, 9:13 AM

## 2022-07-29 NOTE — Progress Notes (Signed)
Mobility Specialist: Pro  07/29/22 1648  Mobility  Activity Refused mobility   Pt refused mobility stating he has already been up today. Will f/u as able.   Zachary Chambers Mobility Specialist Please contact via SecureChat or Rehab office at [phone]

## 2022-07-29 NOTE — NC FL2 (Signed)
[place] LEVEL OF CARE FORM     IDENTIFICATION  Patient Name: Zachary Chambers Birthdate: 1951-03-18 Sex: male Admission Date (Current Location): 07/27/2022  [facility] and [place] Number:  Herbalist and Address:  [facility] [place], [place] [address] [place], [place] [address]      Provider Number: [id number]  Attending Physician Name and Address:  Tawni Millers,*  Relative Name and Phone Number:  [name] (Spouse) [phone]    Current Level of Care: Hospital Recommended Level of Care: [place] Prior Approval Number:    Date Approved/Denied:   PASRR Number: [id number]  Discharge Plan: SNF    Current Diagnoses: Patient Active Problem List   Diagnosis Date Noted   Malnutrition of moderate degree 07/29/2022   Sepsis due to gram-negative UTI ([place]) 07/27/2022   FTT (failure to thrive) in adult with weakness and frequent falls 07/27/2022   multiple skin tears from falls and early sacral pressure ulcer 07/27/2022   Cognitive impairment 11/05/2021   PSVT (paroxysmal supraventricular tachycardia) 11/04/2021   Primary hypertension    Hypokalemia 03/12/2021   Anxiety 10/07/2020   History of CVA with residual deficit 10/07/2020   Chronic respiratory failure with hypoxia and hypercapnia (HCC) - 2 L/min at rest, 5 L/min with activity, baseline POC2 50 mmHg 07/05/2020   PAF (paroxysmal atrial fibrillation) ([place]) 07/05/2020   Dyspnea 07/04/2020   Protein-calorie malnutrition, severe 03/07/2020   S/P coronary artery stent placement    Hyperlipidemia 03/06/2020   Heavy alcohol use 03/04/2018   Memory changes 03/04/2018   Bruit 01/22/2014   COPD (chronic obstructive pulmonary disease) ([place]) 01/14/2011   CAD (coronary artery disease) 01/14/2011   GERD (gastroesophageal reflux disease) 01/14/2011   TOBACCO ABUSE 07/25/2009   Osteoarthritis 07/25/2009   ROTATOR CUFF SYNDROME, RIGHT 07/25/2009    Orientation  RESPIRATION BLADDER Height & Weight     Self, Time, Situation, Place  O2 (2L) Incontinent Weight: 124 lb (56.2 kg) Height:  '5\' 8"'$  (172.7 cm)  BEHAVIORAL SYMPTOMS/MOOD NEUROLOGICAL BOWEL NUTRITION STATUS      Incontinent Diet  AMBULATORY STATUS COMMUNICATION OF NEEDS Skin   Extensive Assist Verbally Skin abrasions                       Personal Care Assistance Level of Assistance  Bathing, Feeding, Dressing Bathing Assistance: Limited assistance Feeding assistance: Independent Dressing Assistance: Limited assistance     Functional Limitations Info  Sight, Hearing, Speech Sight Info: Impaired Hearing Info: Adequate Speech Info: Adequate    SPECIAL CARE FACTORS FREQUENCY  PT (By licensed PT), OT (By licensed OT)     PT Frequency: 5x/ week OT Frequency: 5x/ week            Contractures Contractures Info: Not present    Additional Factors Info  Code Status, Allergies, Psychotropic Code Status Info: Full Allergies Info: No Known Allergies Psychotropic Info: Quetiapine '25mg'$          Current Medications (07/29/2022):  This is the current hospital active medication list Current Facility-Administered Medications  Medication Dose Route Frequency Provider Last Rate Last Admin   acetaminophen (TYLENOL) tablet 650 mg  650 mg Oral Q6H PRN Orma Flaming, MD       Or   acetaminophen (TYLENOL) suppository 650 mg  650 mg Rectal Q6H PRN Orma Flaming, MD       albuterol (PROVENTIL) (2.5 MG/3ML) 0.083% nebulizer solution 2.5 mg  2.5 mg Nebulization Q6H PRN Orma Flaming,  MD       apixaban (ELIQUIS) tablet 5 mg  5 mg Oral BID Orma Flaming, MD   5 mg at 07/29/22 0913   atorvastatin (LIPITOR) tablet 40 mg  40 mg Oral QHS Orma Flaming, MD   40 mg at 07/28/22 2152   cephALEXin (KEFLEX) capsule 500 mg  500 mg Oral Q12H Arrien, Jimmy Picket, MD       feeding supplement (ENSURE ENLIVE / ENSURE PLUS) liquid 237 mL  237 mL Oral BID BM Shawna Clamp, MD   237 mL at 07/29/22  1338   fluticasone (FLONASE) 50 MCG/ACT nasal spray 2 spray  2 spray Each Nare Daily Orma Flaming, MD   2 spray at 07/29/22 0912   guaiFENesin (MUCINEX) 12 hr tablet 600 mg  600 mg Oral BID Orma Flaming, MD   600 mg at 07/29/22 0913   ipratropium-albuterol (DUONEB) 0.5-2.5 (3) MG/3ML nebulizer solution 3 mL  3 mL Nebulization TID Orma Flaming, MD   3 mL at 07/29/22 1338   isosorbide mononitrate (IMDUR) 24 hr tablet 15 mg  15 mg Oral Daily Orma Flaming, MD   15 mg at 07/29/22 0913   magnesium oxide (MAG-OX) tablet 400 mg  400 mg Oral Daily Orma Flaming, MD   400 mg at 07/29/22 0913   metoprolol tartrate (LOPRESSOR) tablet 50 mg  50 mg Oral BID Orma Flaming, MD   50 mg at 07/29/22 0913   mometasone-formoterol (DULERA) 100-5 MCG/ACT inhaler 2 puff  2 puff Inhalation BID Pham, Minh Q, RPH-CPP   2 puff at 07/29/22 4098   multivitamin with minerals tablet 1 tablet  1 tablet Oral Daily Orma Flaming, MD   1 tablet at 07/29/22 0913   Oral care mouth rinse  15 mL Mouth Rinse PRN Arrien, Jimmy Picket, MD       pantoprazole (PROTONIX) EC tablet 80 mg  80 mg Oral Daily Orma Flaming, MD   80 mg at 07/29/22 0913   [START ON 07/30/2022] predniSONE (DELTASONE) tablet 10 mg  10 mg Oral Q breakfast Arrien, Jimmy Picket, MD       QUEtiapine (SEROQUEL) tablet 25 mg  25 mg Oral QHS Orma Flaming, MD   25 mg at 07/28/22 2142     Discharge Medications: Please see discharge summary for a list of discharge medications.  Relevant Imaging Results:  Relevant Lab Results:   Additional Information SSN:[id number]  Trenna Kiely F Asuzena Weis, LCSWA

## 2022-07-29 NOTE — Assessment & Plan Note (Addendum)
Continue with nutritional supplements.  Slow taper of systemic corticosteroids.

## 2022-07-29 NOTE — Assessment & Plan Note (Addendum)
No signs of acute exacerbation, continue with bronchodilator therapy. Continue with supplemental 02 per [place] for chronic hypoxemic respiratory failure.  Decrease oral prednisone to 10 mg, slow taper.

## 2022-07-30 ENCOUNTER — Inpatient Hospital Stay (HOSPITAL_COMMUNITY): Payer: Medicare HMO

## 2022-07-30 DIAGNOSIS — E440 Moderate protein-calorie malnutrition: Secondary | ICD-10-CM

## 2022-07-30 DIAGNOSIS — K219 Gastro-esophageal reflux disease without esophagitis: Secondary | ICD-10-CM

## 2022-07-30 DIAGNOSIS — F418 Other specified anxiety disorders: Secondary | ICD-10-CM

## 2022-07-30 LAB — BASIC METABOLIC PANEL
Anion gap: 8 (ref 5–15)
BUN: 21 mg/dL (ref 8–23)
CO2: 33 mmol/L — ABNORMAL HIGH (ref 22–32)
Calcium: 9 mg/dL (ref 8.9–10.3)
Chloride: 98 mmol/L (ref 98–111)
Creatinine, Ser: 1.05 mg/dL (ref 0.61–1.24)
GFR, Estimated: >60 mL/min (ref >60)
Glucose, Bld: 89 mg/dL (ref 70–99)
Potassium: 3.8 mmol/L (ref 3.5–5.1)
Sodium: 139 mmol/L (ref 135–145)

## 2022-07-30 LAB — CBC
HCT: 37.9 % — ABNORMAL LOW (ref 39.0–52.0)
Hemoglobin: 11.9 g/dL — ABNORMAL LOW (ref 13.0–17.0)
MCH: 26.9 pg (ref 26.0–34.0)
MCHC: 31.4 g/dL (ref 30.0–36.0)
MCV: 85.6 fL (ref 80.0–100.0)
Platelets: 360 10*3/uL (ref 150–400)
RBC: 4.43 MIL/uL (ref 4.22–5.81)
RDW: 21.1 % — ABNORMAL HIGH (ref 11.5–15.5)
WBC: 10 10*3/uL (ref 4.0–10.5)
nRBC: 0 % (ref 0.0–0.2)

## 2022-07-30 MED ORDER — PANTOPRAZOLE SODIUM 40 MG PO TBEC
40.0000 mg | DELAYED_RELEASE_TABLET | Freq: Every day | ORAL | Status: DC
  Administered 2022-07-31: 40 mg via ORAL
  Filled 2022-07-30: qty 1

## 2022-07-30 NOTE — Progress Notes (Signed)
PT Cancellation Note  Patient Details Name: Zachary Chambers MRN: [id number] DOB: 1951-07-01   Cancelled Treatment:    Reason Eval/Treat Not Completed: Patient declined, no reason specified  Refused PT. Stating "You damn people won't leave me alone." Offered to assist OOB, sit up to eat lunch which has just arrived. Becomes further agitated with therapist.  Did stand EOB with mobility team earlier this morning.  Will follow-up as time allows.  Candie Mile, PT, DPT Physical Therapist Acute Rehabilitation Services [facility] 07/30/2022, 12:09 PM

## 2022-07-30 NOTE — Progress Notes (Signed)
Nutrition Brief Note:   MD consult for assessment of nutritional status. Pt was seen by RD on 07/28/22. Please see note from 12/12 for full assessment. Pt was eating fair at that time 50-75%. Per record, pt refused meal today. Pt does qualify for malnutrition. Will message MD about appetite stimulant.   Thalia Bloodgood, RD, LDN, CNSC.

## 2022-07-30 NOTE — TOC CAGE-AID Note (Signed)
Transition of Care [facility]) - CAGE-AID Screening   Patient Details  Name: Zachary Chambers MRN: [id number] Date of Birth: 11/15/1950   Elvina Sidle, RN Trauma Response Nurse Phone Number: [phone] 07/30/2022, 4:54 PM       CAGE-AID Screening:    Have You Ever Felt You Ought to Cut Down on Your Drinking or Drug Use?: No Have People Annoyed You By Critizing Your Drinking Or Drug Use?: No Have You Felt Bad Or Guilty About Your Drinking Or Drug Use?: No Have You Ever Had a Drink or Used Drugs First Thing In The Morning to Steady Your Nerves or to Get Rid of a Hangover?: No CAGE-AID Score: 0  Substance Abuse Education Offered: No (Pt told admitting MD that he does not drink or smoke -)

## 2022-07-30 NOTE — Progress Notes (Addendum)
Progress Note   Patient: Zachary Chambers AOZ:[id number] DOB: 03-15-1951 DOA: 07/27/2022     2 DOS: the patient was seen and examined on 07/30/2022   Brief hospital course: Mr. Dollins was admitted to the hospital with the working diagnosis of sepsis due to urine infection.   71 yo male with the past medical history of COPD, chronic hypoxemic respiratory failure, paroxysmal atrial fibrillation, dyslipidemia, history of CVA, cognitive impairment and depression who presented with weakness and frequent falls. Reported progressive symptoms for 4 days, requiring maximal assistance for ambulating. Positive subejctive fevers, cough and dyspnea with exertion. Multiple falls at home that prompted her wife to bring him to the ED. On his initial physical examination his blood pressure was 130/76, HR 102 RR 17, Temp 101.1 and 02 saturation 94% on 4 L/min per [place]. He was awake and alert, luns with no wheezing or rales, heart with S1 and S2 present and rhythmic, abdomen soft and no lower extremity edema. Bilateral anterior tibial excoriations and signs of trauma.    Na 140, K 4,4 Cl 99, bicarbonate 29, glucose 109 bun 26 cr 1,18  Wbc 25,2 hgb 14,8 plt 419  Sars covid 19 negative Influenza negative  Toxicology negative   Urine analysis with SG 1,016, protein 100, 21-50 wbc, 11-20 rbc, positive nitrites.   Head CT and cervical spine with no acute changes.  Chest radiograph with no infiltrates, positive hyperinflation.  Radiograph of upper and lower extremities, pelvis, negative for fractures.   EKG 86 bpm, normal axis, normal intervals, sinus rhythm with left atrial enlargement, no significant ST segment or  T wave changes.   Patient was placed on antibiotic therapy with good toleration.  PT and OT consulted with recommendations for short term SNF.   12/14 Patient has agreed to be transfer to SNF, medically stable.   Assessment and Plan: * Sepsis due to gram-negative UTI ([place]) Sepsis present on admission.   Patient has been afebrile, wbc is 10.0 Patient has been afebrile.  Urine culture < 10,000  CFU  Continue antibiotic therapy with cephalexin for a total of 5 days.  Check renal US to rule obstructive uropathy.   FTT (failure to thrive) in adult with weakness and frequent falls Patient continue to be very weak and deconditioned, PT and OT have recommended short term rehab.  Out of bed to chair tid with meals.   multiple skin tears from falls and early sacral pressure ulcer Continue local skin care.  Out of bed to chair tid with meals, continue nutritional support and PT/ OT.   COPD (chronic obstructive pulmonary disease) (HCC) No signs of acute exacerbation, continue with bronchodilator therapy. Continue with supplemental 02 per  for chronic hypoxemic respiratory failure.  Continue with oral prednisone to 10 mg and slow taper. At home he has been on 20 mg daily.   Chronic respiratory failure with hypoxia and hypercapnia (HCC) - 2 L/min at rest, 5 L/min with activity, baseline POC2 50 mmHg Stable on home oxygen   PAF (paroxysmal atrial fibrillation) (HCC) CHADS2-VASC of 5 Continue rate control with metoprolol and anticoagulation with apixaban. Ok to discontinue telemetry.   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  No chest pain or angina.   History of CVA with residual deficit Continue apixaban for anticoagulation, statin therapy and blood pressure control.     GERD (gastroesophageal reflux disease) Continue PPI  Hyperlipidemia Continue  lipitor   Depression Continue with quetiapine.   Malnutrition of moderate degree Continue with nutritional supplements.         Subjective: Patient is feeling better, but continue to be very weak and deconditioned.   Physical Exam: Vitals:   07/30/22 0438 07/30/22 0735 07/30/22 0738 07/30/22 0939  BP: 136/84   121/65   Pulse: 74   78  Resp: 18   18  Temp: (!) 97.5 F (36.4 C)   98 F (36.7 C)  TempSrc: Oral     SpO2: 99% 95% 93% 96%  Weight:      Height:       Neurology awake and alert ENT with mild pallor Cardiovascular with S1 and S2 present and rhythmic Respiratory with no rales or wheezing, prolonged expiratory phase Abdomen with no distention  No lower extremity edema  Data Reviewed:    Family Communication: I spoke with patient's wife over the phone, we talked in detail about patient's condition, plan of care and prognosis and all questions were addressed.    Disposition: Status is: Inpatient Remains inpatient appropriate because: Pending transfer to SNF, patient is medically stable,.   Planned Discharge Destination: Skilled nursing facility     Author: Tawni Millers, MD 07/30/2022 10:11 AM  For on call review [email or web address].

## 2022-07-30 NOTE — TOC Progression Note (Addendum)
Transition of Care [facility]) - Initial/Assessment Note    Patient Details  Name: Zachary Chambers MRN: [id number] Date of Birth: [birth year]  [facility]) CM/SW Contact:    Milinda Antis, [place] Phone Number: 07/30/2022, 9:48 AM  Clinical Narrative:                 LCSW attempted to contact the patient to present bed offers.  There was no answer.  LCSW contacted the patient's wife, Zachary Chambers, and presented bed offers.  The spouse will meet with the patient at bedside around 1400 and inform LCSW of the final decision.  38-  LCSW contacted the patient's spouse to receive bed choice.  The patient and spouse choose [facility].    LCSW contacted the facility, verified that a bed is still available, and requested that TOC SWAs begin insurance authorization.  TOC following.  Expected Discharge Plan: Skilled Nursing Facility Barriers to Discharge: Continued Medical Work up, SNF Pending bed offer   Patient Goals and CMS Choice Patient states their goals for this hospitalization and ongoing recovery are:: do basic things      Expected Discharge Plan and Services Expected Discharge Plan: [place] In-house Referral: Clinical Social Work   Post Acute Care Choice:  (TBD) Living arrangements for the past 2 months: Single Family Home                                      Prior Living Arrangements/Services Living arrangements for the past 2 months: Single Family Home Lives with:: Spouse Patient language and need for interpreter reviewed:: Yes Do you feel safe going back to the place where you live?: Yes      Need for Family Participation in Patient Care: Yes (Comment) Care giver support system in place?: Yes (comment) Current home services: Other (comment) (none) Criminal Activity/Legal Involvement Pertinent to Current Situation/Hospitalization: No - Comment as needed  Activities of Daily Living Home Assistive Devices/Equipment: None ADL Screening (condition  at time of admission) Patient's cognitive ability adequate to safely complete daily activities?: No Is the patient deaf or have difficulty hearing?: No Does the patient have difficulty seeing, even when wearing glasses/contacts?: No Does the patient have difficulty concentrating, remembering, or making decisions?: No Patient able to express need for assistance with ADLs?: Yes Does the patient have difficulty dressing or bathing?: Yes Independently performs ADLs?: Yes (appropriate for developmental age) Does the patient have difficulty walking or climbing stairs?: Yes Weakness of Legs: Both Weakness of Arms/Hands: None  Permission Sought/Granted Permission sought to share information with : Family Supports Permission granted to share information with : Yes, Verbal Permission Granted  Share Information with NAME: [name]           Emotional Assessment Appearance:: Appears stated age Attitude/Demeanor/Rapport: Engaged Affect (typically observed): Appropriate, Pleasant Orientation: : Oriented to Self, Oriented to Place, Oriented to  Time, Oriented to Situation      Admission diagnosis:  Confusion [R41.0] SOB (shortness of breath) [R06.02] COPD with acute exacerbation ([place]) [J44.1] Fall, initial encounter [W19.XXXA] Sepsis ([place]) [A41.9] Fever, unspecified fever cause [R50.9] Patient Active Problem List   Diagnosis Date Noted   Malnutrition of moderate degree 07/29/2022   Sepsis due to gram-negative UTI ([place]) 07/27/2022   FTT (failure to thrive) in adult with weakness and frequent falls 07/27/2022   multiple skin tears from falls and early sacral pressure ulcer 07/27/2022  Cognitive impairment 11/05/2021   PSVT (paroxysmal supraventricular tachycardia) 11/04/2021   Primary hypertension    Hypokalemia 03/12/2021   Anxiety 10/07/2020   History of CVA with residual deficit 10/07/2020   Chronic respiratory failure with hypoxia and hypercapnia (HCC) - 2 L/min at rest, 5 L/min  with activity, baseline POC2 50 mmHg 07/05/2020   PAF (paroxysmal atrial fibrillation) ([place]) 07/05/2020   Dyspnea 07/04/2020   Protein-calorie malnutrition, severe 03/07/2020   S/P coronary artery stent placement    Hyperlipidemia 03/06/2020   Heavy alcohol use 03/04/2018   Memory changes 03/04/2018   Bruit 01/22/2014   COPD (chronic obstructive pulmonary disease) ([place]) 01/14/2011   CAD (coronary artery disease) 01/14/2011   GERD (gastroesophageal reflux disease) 01/14/2011   TOBACCO ABUSE 07/25/2009   Osteoarthritis 07/25/2009   ROTATOR CUFF SYNDROME, RIGHT 07/25/2009   PCP:  [name], MD Pharmacy:   [facility], [place] - [address] [place] [address] Phone: [phone] Fax: [phone]  [facility] [place] [address] [place] [address] Phone: [phone] Fax: [phone]     Social Determinants of Health ([place]) Interventions    Readmission Risk Interventions    03/13/2021    1:35 PM  Readmission Risk Prevention Plan  Transportation Screening Complete  Home Care Screening Complete  Medication Review (RN CM) Complete

## 2022-07-30 NOTE — Progress Notes (Signed)
Mobility Specialist: Progress Note   07/30/22 1110  Mobility  Activity Stood at bedside  Level of Assistance Contact guard assist, steadying assist  Assistive Device Front wheel walker  Distance Ambulated (ft) 2 ft  Activity Response Tolerated well  Mobility Referral Yes  $Mobility charge 1 Mobility   Pre-Mobility: 79 HR, 91% SpO2 During Mobility: 89% SpO2 Post-Mobility: 84 HR, 92% SpO2  Pt received in the bed and agreeable to mobility. Mod I with bed mobility. Performed LE exercises sitting EOB and pt requesting to get back in bed. Pt agreeable to stand and side step to [facility], contact guard for balance. C/o mild dizziness and SOB throughout. Pt back in bed with call bell and phone at his side. Bed alarm is on.   [place] [employer] Mobility Specialist Please contact via SecureChat or Rehab office at [phone]

## 2022-07-31 ENCOUNTER — Encounter (HOSPITAL_COMMUNITY): Payer: Self-pay | Admitting: Family Medicine

## 2022-07-31 ENCOUNTER — Other Ambulatory Visit: Payer: Self-pay

## 2022-07-31 DIAGNOSIS — I639 Cerebral infarction, unspecified: Secondary | ICD-10-CM | POA: Diagnosis not present

## 2022-07-31 DIAGNOSIS — I69359 Hemiplegia and hemiparesis following cerebral infarction affecting unspecified side: Secondary | ICD-10-CM | POA: Diagnosis not present

## 2022-07-31 DIAGNOSIS — R4189 Other symptoms and signs involving cognitive functions and awareness: Secondary | ICD-10-CM | POA: Diagnosis not present

## 2022-07-31 DIAGNOSIS — F418 Other specified anxiety disorders: Secondary | ICD-10-CM | POA: Diagnosis not present

## 2022-07-31 DIAGNOSIS — S51019A Laceration without foreign body of unspecified elbow, initial encounter: Secondary | ICD-10-CM | POA: Diagnosis not present

## 2022-07-31 DIAGNOSIS — E785 Hyperlipidemia, unspecified: Secondary | ICD-10-CM

## 2022-07-31 DIAGNOSIS — K219 Gastro-esophageal reflux disease without esophagitis: Secondary | ICD-10-CM | POA: Diagnosis not present

## 2022-07-31 DIAGNOSIS — I482 Chronic atrial fibrillation, unspecified: Secondary | ICD-10-CM | POA: Diagnosis not present

## 2022-07-31 DIAGNOSIS — Z0000 Encounter for general adult medical examination without abnormal findings: Secondary | ICD-10-CM | POA: Diagnosis not present

## 2022-07-31 DIAGNOSIS — F32A Depression, unspecified: Secondary | ICD-10-CM | POA: Diagnosis not present

## 2022-07-31 DIAGNOSIS — J449 Chronic obstructive pulmonary disease, unspecified: Secondary | ICD-10-CM | POA: Diagnosis not present

## 2022-07-31 DIAGNOSIS — I4891 Unspecified atrial fibrillation: Secondary | ICD-10-CM | POA: Diagnosis not present

## 2022-07-31 DIAGNOSIS — A4150 Gram-negative sepsis, unspecified: Secondary | ICD-10-CM | POA: Diagnosis not present

## 2022-07-31 DIAGNOSIS — R143 Flatulence: Secondary | ICD-10-CM | POA: Diagnosis not present

## 2022-07-31 DIAGNOSIS — M6281 Muscle weakness (generalized): Secondary | ICD-10-CM | POA: Diagnosis not present

## 2022-07-31 DIAGNOSIS — N390 Urinary tract infection, site not specified: Secondary | ICD-10-CM | POA: Diagnosis not present

## 2022-07-31 DIAGNOSIS — E440 Moderate protein-calorie malnutrition: Secondary | ICD-10-CM | POA: Diagnosis not present

## 2022-07-31 DIAGNOSIS — I2542 Coronary artery dissection: Secondary | ICD-10-CM | POA: Diagnosis not present

## 2022-07-31 DIAGNOSIS — J9610 Chronic respiratory failure, unspecified whether with hypoxia or hypercapnia: Secondary | ICD-10-CM | POA: Diagnosis not present

## 2022-07-31 DIAGNOSIS — R627 Adult failure to thrive: Secondary | ICD-10-CM | POA: Diagnosis not present

## 2022-07-31 DIAGNOSIS — Z7401 Bed confinement status: Secondary | ICD-10-CM | POA: Diagnosis not present

## 2022-07-31 DIAGNOSIS — A419 Sepsis, unspecified organism: Secondary | ICD-10-CM | POA: Diagnosis not present

## 2022-07-31 DIAGNOSIS — R059 Cough, unspecified: Secondary | ICD-10-CM | POA: Diagnosis not present

## 2022-07-31 DIAGNOSIS — I2510 Atherosclerotic heart disease of native coronary artery without angina pectoris: Secondary | ICD-10-CM | POA: Diagnosis not present

## 2022-07-31 DIAGNOSIS — G8929 Other chronic pain: Secondary | ICD-10-CM | POA: Diagnosis not present

## 2022-07-31 MED ORDER — PREDNISONE 10 MG PO TABS: 10.0000 mg | ORAL_TABLET | Freq: Every day | ORAL | 0 refills | Status: AC

## 2022-07-31 MED ORDER — IPRATROPIUM-ALBUTEROL 0.5-2.5 (3) MG/3ML IN SOLN: 3.0000 mL | Freq: Two times a day (BID) | RESPIRATORY_TRACT | Status: DC

## 2022-07-31 MED ORDER — ACETAMINOPHEN 325 MG PO TABS: 650.0000 mg | ORAL_TABLET | Freq: Four times a day (QID) | ORAL | Status: DC | PRN

## 2022-07-31 MED ORDER — MEGESTROL ACETATE 400 MG/10ML PO SUSP
400.0000 mg | Freq: Every day | ORAL | Status: DC
  Administered 2022-07-31: 400 mg via ORAL
  Filled 2022-07-31: qty 10

## 2022-07-31 MED ORDER — ENSURE ENLIVE PO LIQD: 237.0000 mL | Freq: Two times a day (BID) | ORAL | 0 refills | Status: AC

## 2022-07-31 MED ORDER — CEPHALEXIN 500 MG PO CAPS: 500.0000 mg | ORAL_CAPSULE | Freq: Two times a day (BID) | ORAL | 0 refills | Status: AC

## 2022-07-31 NOTE — Care Management Important Message (Signed)
Important Message  Patient Details  Name: Zachary Chambers MRN: [id number] Date of Birth: [birth year]   Medicare Important Message Given:  Yes     Samuel Mcpeek [address] 07/31/2022, 4:32 PM

## 2022-07-31 NOTE — Progress Notes (Signed)
Gave report to [place] at [employer].

## 2022-07-31 NOTE — Care Management Important Message (Signed)
Important Message  Patient Details  Name: Zachary Chambers MRN: [id number] Date of Birth: [birth year]   Medicare Important Message Given:  Yes     Brodey Bonn [address] 07/31/2022, 4:31 PM

## 2022-07-31 NOTE — TOC Transition Note (Addendum)
Transition of Care [facility] - CM/SW Discharge Note   Patient Details  Name: Zachary Chambers MRN: [id number] Date of Birth: [birth year]  [facility]) CM/SW Contact:  Milinda Antis, [place] Phone Number: 07/31/2022, 2:22 PM   Clinical Narrative:    Patient will DC to:  [facility] Anticipated DC date:  07/31/2022 Family notified: Yes, message left for patient's spouse Frutoso Schatz Transport by: Corey Harold   Per MD patient ready for DC to SNF . RN to call report prior to discharge [phone] room [id number]). RN, patient's family, and facility notified of DC. Discharge Summary sent to facility. Ambulance transport will be requested for patient.   CSW will sign off for now as social work intervention is no longer needed. Please consult [place] again if new needs arise.    Final next level of care: Skilled Nursing Facility Barriers to Discharge: Barriers Resolved   Patient Goals and CMS Choice Patient states their goals for this hospitalization and ongoing recovery are:: do basic things      Discharge Placement              Patient chooses bed at: [facility] Patient to be transferred to facility by: [place] Name of family member notified: Frutoso Schatz- spouse Patient and family notified of of transfer: 07/31/22  Discharge Plan and Services In-house Referral: Clinical Social Work   Post Acute Care Choice:  (TBD)                               Social Determinants of Health ([place]) Interventions     Readmission Risk Interventions    03/13/2021    1:35 PM  Readmission Risk Prevention Plan  Transportation Screening Complete  Home Care Screening Complete  Medication Review (RN CM) Complete

## 2022-07-31 NOTE — Discharge Summary (Signed)
Physician Discharge Summary   Patient: Zachary Chambers MRN: [id number] DOB: 1951/02/20  Admit date:     07/27/2022  Discharge date: 07/31/22  Discharge Physician: Zachary Chambers   PCP: [name], MD   Recommendations at discharge:    Continue with cephalexin for one more day. Decreased prednisone to 10 mg, will need a slow taper as tolerated. Continue nutritional support.   Discharge Diagnoses: Principal Problem:   Sepsis due to gram-negative UTI ([place]) Active Problems:   FTT (failure to thrive) in adult with weakness and frequent falls   multiple skin tears from falls and early sacral pressure ulcer   COPD (chronic obstructive pulmonary disease) (HCC)   PAF (paroxysmal atrial fibrillation) (HCC)   CAD (coronary artery disease)   History of CVA with residual deficit   GERD (gastroesophageal reflux disease)   Hyperlipidemia   Depression   Malnutrition of moderate degree  Resolved Problems:   Depression with anxiety  Hospital Course: Zachary Chambers was admitted to the hospital with the working diagnosis of sepsis due to urine infection.   71 yo male with the past medical history of COPD, chronic hypoxemic respiratory failure, paroxysmal atrial fibrillation, dyslipidemia, history of CVA, cognitive impairment and depression who presented with weakness and frequent falls. Reported progressive symptoms for 4 days, requiring maximal assistance for ambulating. Positive subejctive fevers, cough and dyspnea with exertion. Multiple falls at home that prompted her wife to bring him to the ED. On his initial physical examination his blood pressure was 130/76, HR 102 RR 17, Temp 101.1 and 02 saturation 94% on 4 L/min per [place]. He was awake and alert, luns with no wheezing or rales, heart with S1 and S2 present and rhythmic, abdomen soft and no lower extremity edema. Bilateral anterior tibial excoriations and signs of trauma.    Na 140, K 4,4 Cl 99, bicarbonate 29, glucose 109 bun 26 cr  1,18  Wbc 25,2 hgb 14,8 plt 419  Sars covid 19 negative Influenza negative  Toxicology negative   Urine analysis with SG 1,016, protein 100, 21-50 wbc, 11-20 rbc, positive nitrites.   Head CT and cervical spine with no acute changes.  Chest radiograph with no infiltrates, positive hyperinflation.  Radiograph of upper and lower extremities, pelvis, negative for fractures.   EKG 86 bpm, normal axis, normal intervals, sinus rhythm with left atrial enlargement, no significant ST segment or  T wave changes.   Patient was placed on antibiotic therapy with good toleration.  PT and OT consulted with recommendations for short term SNF.   12/14 Patient has agreed to be transfer to SNF, medically stable.  12./15 Patient is medically stable to be transfer to SNF, pending insurance authorization.   Assessment and Plan: * Sepsis due to gram-negative UTI ([place]) Sepsis present on admission.  Urine culture < 10,000  CFU  Continue antibiotic therapy with cephalexin for a total of 5 days.  Renal US with no obstruction.   FTT (failure to thrive) in adult with weakness and frequent falls Patient continue to be very weak and deconditioned, PT and OT have recommended short term rehab.  Out of bed to chair tid with meals.   multiple skin tears from falls and early sacral pressure ulcer Continue local skin care.  Out of bed to chair tid with meals, continue nutritional support and PT/ OT.   COPD (chronic obstructive pulmonary disease) (HCC) No signs of acute exacerbation, continue with bronchodilator therapy. Continue with supplemental 02 per [place] for chronic hypoxemic respiratory  failure.  Continue with oral prednisone to 10 mg and slow taper. At home he has been on 20 mg daily.   Chronic respiratory failure with hypoxia and hypercapnia (HCC) - 2 L/min at rest, 5 L/min with activity, baseline POC2 50 mmHg Stable on home oxygen   PAF (paroxysmal atrial fibrillation) (HCC) CHADS2-VASC of 5 Continue  rate control with metoprolol and anticoagulation with apixaban.  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  No chest pain or angina.   History of CVA with residual deficit Continue apixaban for anticoagulation, statin therapy and blood pressure control.     GERD (gastroesophageal reflux disease) Continue PPI  Hyperlipidemia Continue lipitor   Depression Continue with quetiapine.   Malnutrition of moderate degree Continue with nutritional supplements.  Slow taper of systemic corticosteroids.          Consultants: none  Procedures performed: none   Disposition: Skilled nursing facility Diet recommendation:  Discharge Diet Orders (From admission, onward)     Start     Ordered   07/31/22 0000  Diet - low sodium heart healthy        07/31/22 1411           Cardiac diet DISCHARGE MEDICATION: Allergies as of 07/31/2022   No Known Allergies      Medication List     STOP taking these medications    aspirin 81 MG tablet   guaiFENesin 600 MG 12 hr tablet Commonly known as: MUCINEX       TAKE these medications    acetaminophen 325 MG tablet Commonly known as: TYLENOL Take 2 tablets (650 mg total) by mouth every 6 (six) hours as needed for mild pain (or Fever >/= 101).   albuterol (2.5 MG/3ML) 0.083% nebulizer solution Commonly known as: PROVENTIL Take 3 mLs (2.5 mg total) by nebulization every 4 (four) hours as needed for wheezing or shortness of breath.   atorvastatin 40 MG tablet Commonly known as: LIPITOR Take 1 tablet (40 mg total) by mouth at bedtime.   benzonatate 100 MG capsule Commonly known as: Tessalon Perles Take 1 capsule (100 mg total) by mouth 3 (three) times daily as needed for cough.   Breztri Aerosphere 160-9-4.8 MCG/ACT Aero Generic drug: Budeson-Glycopyrrol-Formoterol INHALE 2 PUFFS into lungs 2 TIMES DAILY What changed:  See the new instructions.   cephALEXin 500 MG capsule Commonly known as: KEFLEX Take 1 capsule (500 mg total) by mouth every 12 (twelve) hours for 1 day.   clotrimazole-betamethasone cream Commonly known as: LOTRISONE APPLY TO SKIN 2 TIMES DAILY   D3 PO Take 1 tablet by mouth daily.   Eliquis 5 MG Tabs tablet Generic drug: apixaban TAKE 1 TABLET BY MOUTH 2 TIMES DAILY   feeding supplement Liqd Take 237 mLs by mouth 2 (two) times daily between meals.   fluticasone 50 MCG/ACT nasal spray Commonly known as: FLONASE Place 2 sprays into both nostrils daily.   isosorbide mononitrate 30 MG 24 hr tablet Commonly known as: IMDUR TAKE 1/2 TABLET BY MOUTH EVERY DAY   magnesium oxide 400 (240 Mg) MG tablet Commonly known as: MAG-OX Take 1 tablet (400 mg total) by mouth daily.   metoprolol tartrate 50 MG tablet Commonly known as: LOPRESSOR Take 1 tablet (50 mg total) by mouth 2 (two) times daily.   multivitamin with minerals Tabs tablet Take 1 tablet by mouth daily.   nitroGLYCERIN 0.4 MG  SL tablet Commonly known as: NITROSTAT DISSOLVE 1 TABLET UNDER THE TONGUE EVERY 5 MINUTES AS NEEDED FOR CHEST PAIN   omeprazole 40 MG capsule Commonly known as: PRILOSEC Take 1 capsule (40 mg total) by mouth daily.   predniSONE 10 MG tablet Commonly known as: DELTASONE Take 1 tablet (10 mg total) by mouth daily with breakfast. Start taking on: August 01, 2022 What changed: how much to take   QUEtiapine 25 MG tablet Commonly known as: SEROQUEL Take 1 tablet (25 mg total) by mouth at bedtime.   ReliOn True Metrix Test Strips test strip Generic drug: glucose blood USE AS DIRECTED. To monitor fsbs 2 times per day dx e11.9   True Metrix Air Glucose Meter Devi USE AS DIRECTED. To monitor fsbs 2 times per day dx e11.9   True Metrix Level 1 Low Soln USE AS DIRECTED. To monitor fsbs 2 times per day dx e11.9   VITAMIN B-12 PO Take 1 tablet by mouth daily.                Discharge Care Instructions  (From admission, onward)           Start     Ordered   07/31/22 0000  Discharge wound care:        07/31/22 1411            Discharge Exam: Filed Weights   07/27/22 1000 07/31/22 0100  Weight: 56.2 kg 56.2 kg   BP 122/73 (BP Location: Right Arm)   Pulse 68   Temp 98.4 F (36.9 C) (Oral)   Resp 19   Ht '5\' 8"'$  (1.727 m)   Wt 56.2 kg   SpO2 93%   BMI 18.84 kg/m   Patient with no chest pain or dyspnea, continue very weak and deconditioned   Neurology awake and alert ENT with no pallor Cardiovascular with S1 and S2 present and rhythmic with no gallops. Respiratory with prolonged expiratory phase with no wheezing or rales Abdomen with no distention  No lower extremity edema   Condition at discharge: stable  The results of significant diagnostics from this hospitalization (including imaging, microbiology, ancillary and laboratory) are listed below for reference.   Imaging Studies: US RENAL  Result Date: 07/30/2022 CLINICAL DATA:  Urinary tract infection EXAM: RENAL / URINARY TRACT ULTRASOUND COMPLETE COMPARISON:  05/04/2017 FINDINGS: Right Kidney: Renal measurements: 10.2 x 4.6 x 4.6 cm = volume: 112 mL. Echogenicity within normal limits. No hydronephrosis visualized. Benign simple 1.3 cm cyst within the mid right kidney does not require imaging follow-up. Left Kidney: Renal measurements: 10.3 x 5.8 x 6.1 cm = volume: 189.9 mL. Echogenicity within normal limits. No mass or hydronephrosis visualized. Bladder: Appears normal for degree of bladder distention. Other: None. IMPRESSION: 1. Unremarkable renal ultrasound. Electronically Signed   By: Randa Ngo M.D.   On: 07/30/2022 15:10   DG Elbow Complete Left  Result Date: 07/27/2022 CLINICAL DATA:  Fall on blood thinners. EXAM: LEFT ELBOW - COMPLETE 3 VIEW COMPARISON:  None Available. FINDINGS: Oblique lateral view. No visible joint effusion, fracture, or dislocation. Generalized  osteopenia. IMPRESSION: No acute finding. Electronically Signed   By: Jorje Guild M.D.   On: 07/27/2022 06:52   DG Forearm Left  Result Date: 07/27/2022 CLINICAL DATA:  Fall on blood thinners EXAM: LEFT FOREARM - 2 VIEW COMPARISON:  None Available. FINDINGS: There is no evidence of fracture or other focal bone lesions. 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 Osteopenia and arterial calcification.  IMPRESSION: No acute finding. Electronically Signed   By: Jorje Guild M.D.   On: 07/27/2022 06:51   DG Knee Complete 4 Views Right  Result Date: 07/27/2022 CLINICAL DATA:  Fall on blood thinners EXAM: RIGHT KNEE - COMPLETE 4 VIEW COMPARISON:  10/20/2017 FINDINGS: A true lateral view was not obtained, a notable limitation. No evidence of fracture, dislocation, or visible joint effusion. Tricompartmental degenerative spurring. Atheromatous calcification. IMPRESSION: Limited study without acute finding. Osteoarthritis. Electronically Signed   By: Jorje Guild M.D.   On: 07/27/2022 06:50   DG Pelvis Portable  Result Date: 07/27/2022 CLINICAL DATA:  Fall on blood thinners EXAM: PORTABLE PELVIS 1 VIEWS COMPARISON:  None Available. FINDINGS: Limited pelvis due to rotation, osteopenia, and hardware overlap. No fracture or dislocation seen. Lumbar spine degeneration. Generalized stool retention. IMPRESSION: 1. Limited study without acute finding. 2. Generalized stool retention. Electronically Signed   By: Jorje Guild M.D.   On: 07/27/2022 06:49   DG Chest Port 1 View  Result Date: 07/27/2022 CLINICAL DATA:  Fall on blood thinners EXAM: PORTABLE CHEST 1 VIEW COMPARISON:  06/06/2022 FINDINGS: Chronic scar-like reticulation in the lower lungs. There is emphysema confirmed by CT. There is no edema, consolidation, effusion, or pneumothorax. Normal heart size and mediastinal contours. Coronary stenting. No visible fracture. IMPRESSION: COPD without acute superimposed finding.  Electronically Signed   By: Jorje Guild M.D.   On: 07/27/2022 06:48   CT HEAD WO CONTRAST (5MM)  Result Date: 07/27/2022 CLINICAL DATA:  Facial trauma, blunt.  Level 2 trauma. EXAM: CT HEAD WITHOUT CONTRAST CT CERVICAL SPINE WITHOUT CONTRAST TECHNIQUE: Multidetector CT imaging of the head and cervical spine was performed following the standard protocol without intravenous contrast. Multiplanar CT image reconstructions of the cervical spine were also generated. RADIATION DOSE REDUCTION: This exam was performed according to the departmental dose-optimization program which includes automated exposure control, adjustment of the mA and/or kV according to patient size and/or use of iterative reconstruction technique. COMPARISON:  04/28/2021 FINDINGS: CT HEAD FINDINGS Brain: No evidence of acute infarction, hemorrhage, hydrocephalus, extra-axial collection or mass lesion/mass effect. Remote right frontal parietal infarct which is cortically based. Chronic small vessel ischemia in the cerebral white matter. Generalized brain atrophy. Vascular: No hyperdense vessel or unexpected calcification. Skull: Negative for fracture Sinuses/Orbits: Right enucleation and prosthesis.  No visible injury CT CERVICAL SPINE FINDINGS Alignment: No traumatic malalignment. Degenerative anterolisthesis at C7-T1. Skull base and vertebrae: No acute fracture. No primary bone lesion or focal pathologic process. Soft tissues and spinal canal: No prevertebral fluid or swelling. No visible canal hematoma. Disc levels: Generalized disc space narrowing with endplate ridging. Prominent facet osteoarthritis at C7-T1. C3-4 ankylosis. Upper chest: Emphysema IMPRESSION: No evidence of acute intracranial or cervical spine injury. Electronically Signed   By: Jorje Guild M.D.   On: 07/27/2022 06:32   CT Cervical Spine Wo Contrast  Result Date: 07/27/2022 CLINICAL DATA:  Facial trauma, blunt.  Level 2 trauma. EXAM: CT HEAD WITHOUT CONTRAST CT  CERVICAL SPINE WITHOUT CONTRAST TECHNIQUE: Multidetector CT imaging of the head and cervical spine was performed following the standard protocol without intravenous contrast. Multiplanar CT image reconstructions of the cervical spine were also generated. RADIATION DOSE REDUCTION: This exam was performed according to the departmental dose-optimization program which includes automated exposure control, adjustment of the mA and/or kV according to patient size and/or use of iterative reconstruction technique. COMPARISON:  04/28/2021 FINDINGS: CT HEAD FINDINGS Brain: No evidence of acute infarction, hemorrhage, hydrocephalus, extra-axial collection or mass  lesion/mass effect. Remote right frontal parietal infarct which is cortically based. Chronic small vessel ischemia in the cerebral white matter. Generalized brain atrophy. Vascular: No hyperdense vessel or unexpected calcification. Skull: Negative for fracture Sinuses/Orbits: Right enucleation and prosthesis.  No visible injury CT CERVICAL SPINE FINDINGS Alignment: No traumatic malalignment. Degenerative anterolisthesis at C7-T1. Skull base and vertebrae: No acute fracture. No primary bone lesion or focal pathologic process. Soft tissues and spinal canal: No prevertebral fluid or swelling. No visible canal hematoma. Disc levels: Generalized disc space narrowing with endplate ridging. Prominent facet osteoarthritis at C7-T1. C3-4 ankylosis. Upper chest: Emphysema IMPRESSION: No evidence of acute intracranial or cervical spine injury. Electronically Signed   By: Jorje Guild M.D.   On: 07/27/2022 06:32    Microbiology: Results for orders placed or performed during the hospital encounter of 07/27/22  Resp Panel by RT-PCR (Flu A&B, Covid) Anterior Nasal Swab     Status: None   Collection Time: 07/27/22  6:34 AM   Specimen: Anterior Nasal Swab  Result Value Ref Range Status   SARS Coronavirus 2 by RT PCR NEGATIVE NEGATIVE Final    Comment: (NOTE) SARS-CoV-2  target nucleic acids are NOT DETECTED.  The SARS-CoV-2 RNA is generally detectable in upper respiratory specimens during the acute phase of infection. The lowest concentration of SARS-CoV-2 viral copies this assay can detect is 138 copies/mL. A negative result does not preclude SARS-Cov-2 infection and should not be used as the sole basis for treatment or other patient management decisions. A negative result may occur with  improper specimen collection/handling, submission of specimen other than nasopharyngeal swab, presence of viral mutation(s) within the areas targeted by this assay, and inadequate number of viral copies(<138 copies/mL). A negative result must be combined with clinical observations, patient history, and epidemiological information. The expected result is Negative.  Fact Sheet for Patients:  EntrepreneurPulse.com.au  Fact Sheet for Healthcare Providers:  IncredibleEmployment.be  This test is no t yet approved or cleared by the [place] FDA and  has been authorized for detection and/or diagnosis of SARS-CoV-2 by FDA under an Emergency Use Authorization (EUA). This EUA will remain  in effect (meaning this test can be used) for the duration of the COVID-19 declaration under Section 564(b)(1) of the Act, 21 U.S.C.section 360bbb-3(b)(1), unless the authorization is terminated  or revoked sooner.       Influenza A by PCR NEGATIVE NEGATIVE Final   Influenza B by PCR NEGATIVE NEGATIVE Final    Comment: (NOTE) The Xpert Xpress SARS-CoV-2/FLU/RSV plus assay is intended as an aid in the diagnosis of influenza from Nasopharyngeal swab specimens and should not be used as a sole basis for treatment. Nasal washings and aspirates are unacceptable for Xpert Xpress SARS-CoV-2/FLU/RSV testing.  Fact Sheet for Patients: EntrepreneurPulse.com.au  Fact Sheet for Healthcare  Providers: IncredibleEmployment.be  This test is not yet approved or cleared by the [place] FDA and has been authorized for detection and/or diagnosis of SARS-CoV-2 by FDA under an Emergency Use Authorization (EUA). This EUA will remain in effect (meaning this test can be used) for the duration of the COVID-19 declaration under Section 564(b)(1) of the Act, 21 U.S.C. section 360bbb-3(b)(1), unless the authorization is terminated or revoked.  Performed at [facility], [place] [address] [place], [place] [address]   Blood culture (routine x 2)     Status: None (Preliminary result)   Collection Time: 07/27/22  7:03 AM   Specimen: BLOOD  Result Value Ref Range Status  Specimen Description BLOOD SITE NOT SPECIFIED  Final   Special Requests   Final    BOTTLES DRAWN AEROBIC AND ANAEROBIC Blood Culture adequate volume   Culture   Final    NO GROWTH 4 DAYS Performed at [facility], [address] [place], [place] [address]    Report Status PENDING  Incomplete  Blood culture (routine x 2)     Status: None (Preliminary result)   Collection Time: 07/27/22  7:05 AM   Specimen: BLOOD  Result Value Ref Range Status   Specimen Description BLOOD SITE NOT SPECIFIED  Final   Special Requests   Final    BOTTLES DRAWN AEROBIC AND ANAEROBIC Blood Culture adequate volume   Culture   Final    NO GROWTH 4 DAYS Performed at [facility], [address], [place], [place] [address]    Report Status PENDING  Incomplete  Urine Culture     Status: Abnormal   Collection Time: 07/27/22 10:39 AM   Specimen: Urine, Clean Catch  Result Value Ref Range Status   Specimen Description URINE, CLEAN CATCH  Final   Special Requests NONE  Final   Culture (A)  Final    <10,000 COLONIES/mL INSIGNIFICANT GROWTH Performed at [facility], [place] [address], [place], [place] [address]    Report Status 07/29/2022 FINAL  Final  Respiratory (~20 pathogens) panel by PCR      Status: None   Collection Time: 07/27/22 12:44 PM   Specimen: Nasopharyngeal Swab; Respiratory  Result Value Ref Range Status   Adenovirus NOT DETECTED NOT DETECTED Final   Coronavirus 229E NOT DETECTED NOT DETECTED Final    Comment: (NOTE) The Coronavirus on the Respiratory Panel, DOES NOT test for the novel  Coronavirus (2019 nCoV)    Coronavirus HKU1 NOT DETECTED NOT DETECTED Final   Coronavirus NL63 NOT DETECTED NOT DETECTED Final   Coronavirus OC43 NOT DETECTED NOT DETECTED Final   Metapneumovirus NOT DETECTED NOT DETECTED Final   Rhinovirus / Enterovirus NOT DETECTED NOT DETECTED Final   Influenza A NOT DETECTED NOT DETECTED Final   Influenza B NOT DETECTED NOT DETECTED Final   Parainfluenza Virus 1 NOT DETECTED NOT DETECTED Final   Parainfluenza Virus 2 NOT DETECTED NOT DETECTED Final   Parainfluenza Virus 3 NOT DETECTED NOT DETECTED Final   Parainfluenza Virus 4 NOT DETECTED NOT DETECTED Final   Respiratory Syncytial Virus NOT DETECTED NOT DETECTED Final   Bordetella pertussis NOT DETECTED NOT DETECTED Final   Bordetella Parapertussis NOT DETECTED NOT DETECTED Final   Chlamydophila pneumoniae NOT DETECTED NOT DETECTED Final   Mycoplasma pneumoniae NOT DETECTED NOT DETECTED Final    Comment: Performed at [facility], [place], [place], [place] [address]    Labs: CBC: Recent Labs  Lab 07/27/22 [phone] 07/27/22 0659 07/28/22 0415 07/29/22 0439 07/30/22 0717  WBC 25.2*  --  17.8* 14.4* 10.0  NEUTROABS 23.7*  --   --   --   --   HGB 14.8 16.0 10.9* 11.3* 11.9*  HCT 47.7 47.0 35.3* 35.5* 37.9*  MCV 86.4  --  86.7 84.5 85.6  PLT 419*  --  337 343 628   Basic Metabolic Panel: Recent Labs  Lab 07/27/22 0634 07/27/22 0659 07/28/22 0415 07/29/22 0439 07/30/22 0717  NA 140 139 140 139 139  K 4.4 4.2 4.4 4.0 3.8  CL 99  --  104 100 98  CO2 29  --  29 28 33*  GLUCOSE 109*  --  125* 89 89  BUN 26*  --  25* 22 21  CREATININE 1.18  --  1.03 0.92 1.05   CALCIUM 9.8  --  8.4* 8.8* 9.0  MG  --   --   --  2.1  --   PHOS  --   --   --  2.0*  --    Liver Function Tests: Recent Labs  Lab 07/27/22 0634  AST 42*  ALT 23  ALKPHOS 71  BILITOT 0.8  PROT 7.7  ALBUMIN 4.6   CBG: Recent Labs  Lab 07/27/22 2008  GLUCAP 172*    Discharge time spent: greater than 30 minutes.  Signed: Tawni Millers, MD [facility] 07/31/2022

## 2022-07-31 NOTE — Progress Notes (Signed)
DISCHARGE NOTE SNF [facility] to be discharged Skilled nursing facility [facility] per MD order. Patient verbalized understanding.  Skin clean, dry and intact without evidence of skin break down, no evidence of skin tears noted. IV catheter discontinued intact. Site without signs and symptoms of complications. Dressing and pressure applied. Pt denies pain at the site currently. No complaints noted.  Patient free of lines, drains, and wounds.   Discharge packet assembled. An After Visit Summary (AVS) was printed and given to the EMS personnel. Patient escorted via stretcher and discharged to [facility] via ambulance. Report called to accepting facility; all questions and concerns addressed.   Berneta Levins, RN

## 2022-07-31 NOTE — Progress Notes (Signed)
Progress Note   Patient: Zachary Chambers:[id number] DOB: 21-Sep-1950 DOA: 07/27/2022     3 DOS: the patient was seen and examined on 07/31/2022   Brief hospital course: Zachary Chambers was admitted to the hospital with the working diagnosis of sepsis due to urine infection.   71 yo male with the past medical history of COPD, chronic hypoxemic respiratory failure, paroxysmal atrial fibrillation, dyslipidemia, history of CVA, cognitive impairment and depression who presented with weakness and frequent falls. Reported progressive symptoms for 4 days, requiring maximal assistance for ambulating. Positive subejctive fevers, cough and dyspnea with exertion. Multiple falls at home that prompted her wife to bring him to the ED. On his initial physical examination his blood pressure was 130/76, HR 102 RR 17, Temp 101.1 and 02 saturation 94% on 4 L/min per [place]. He was awake and alert, luns with no wheezing or rales, heart with S1 and S2 present and rhythmic, abdomen soft and no lower extremity edema. Bilateral anterior tibial excoriations and signs of trauma.    Na 140, K 4,4 Cl 99, bicarbonate 29, glucose 109 bun 26 cr 1,18  Wbc 25,2 hgb 14,8 plt 419  Sars covid 19 negative Influenza negative  Toxicology negative   Urine analysis with SG 1,016, protein 100, 21-50 wbc, 11-20 rbc, positive nitrites.   Head CT and cervical spine with no acute changes.  Chest radiograph with no infiltrates, positive hyperinflation.  Radiograph of upper and lower extremities, pelvis, negative for fractures.   EKG 86 bpm, normal axis, normal intervals, sinus rhythm with left atrial enlargement, no significant ST segment or  T wave changes.   Patient was placed on antibiotic therapy with good toleration.  PT and OT consulted with recommendations for short term SNF.   12/14 Patient has agreed to be transfer to SNF, medically stable.  12./15 Patient is medically stable to be transfer to SNF, pending insurance authorization.    Assessment and Plan: * Sepsis due to gram-negative UTI (Zachary Chambers) Sepsis present on admission.  Urine culture < 10,000  CFU  Continue antibiotic therapy with cephalexin for a total of 5 days.  Renal US with no obstruction.   FTT (failure to thrive) in adult with weakness and frequent falls Patient continue to be very weak and deconditioned, PT and OT have recommended short term rehab.  Out of bed to chair tid with meals.   multiple skin tears from falls and early sacral pressure ulcer Continue local skin care.  Out of bed to chair tid with meals, continue nutritional support and PT/ OT.   COPD (chronic obstructive pulmonary disease) (HCC) No signs of acute exacerbation, continue with bronchodilator therapy. Continue with supplemental 02 per  for chronic hypoxemic respiratory failure.  Continue with oral prednisone to 10 mg and slow taper. At home he has been on 20 mg daily.   Chronic respiratory failure with hypoxia and hypercapnia (HCC) - 2 L/min at rest, 5 L/min with activity, baseline POC2 50 mmHg Stable on home oxygen   PAF (paroxysmal atrial fibrillation) (HCC) CHADS2-VASC of 5 Continue rate control with metoprolol and anticoagulation with apixaban.  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  No chest pain or angina.   History of CVA with residual deficit Continue apixaban for anticoagulation, statin therapy and blood pressure control.     GERD (gastroesophageal reflux disease) Continue PPI  Hyperlipidemia Continue lipitor   Depression Continue  with quetiapine.   Malnutrition of moderate degree Continue with nutritional supplements.  Trial of Megace for increase appetite.         Subjective: Patient with no chest pain or dyspnea, continue very weak and deconditioned,   Physical Exam: Vitals:   07/30/22 2029 07/31/22 0100 07/31/22 0506 07/31/22 0929   BP: 120/72  122/73   Pulse: 80  68   Resp: 19  19   Temp: 98.2 F (36.8 C)  98.4 F (36.9 C)   TempSrc: Oral  Oral   SpO2: 94%  98% 93%  Weight:  56.2 kg    Height:  '5\' 8"'$  (1.727 m)     Neurology awake and alert ENT with no pallor Cardiovascular with S1 and S2 present and rhythmic with no gallops. Respiratory with prolonged expiratory phase with no wheezing or rales Abdomen with no distention  No lower extremity edema  Data Reviewed:    Family Communication: no family at the bedside   Disposition: Status is: Inpatient Remains inpatient appropriate because: pending transfer to SNF   Planned Discharge Destination: Skilled nursing facility      Author: Tawni Millers, MD 07/31/2022 1:50 PM  For on call review [email or web address].

## 2022-08-01 LAB — CULTURE, BLOOD (ROUTINE X 2)
Culture: NO GROWTH
Culture: NO GROWTH
Special Requests: ADEQUATE
Special Requests: ADEQUATE

## 2022-08-03 DIAGNOSIS — N390 Urinary tract infection, site not specified: Secondary | ICD-10-CM | POA: Diagnosis not present

## 2022-08-03 DIAGNOSIS — R4189 Other symptoms and signs involving cognitive functions and awareness: Secondary | ICD-10-CM | POA: Diagnosis not present

## 2022-08-03 DIAGNOSIS — I482 Chronic atrial fibrillation, unspecified: Secondary | ICD-10-CM | POA: Diagnosis not present

## 2022-08-03 DIAGNOSIS — I639 Cerebral infarction, unspecified: Secondary | ICD-10-CM | POA: Diagnosis not present

## 2022-08-03 DIAGNOSIS — K219 Gastro-esophageal reflux disease without esophagitis: Secondary | ICD-10-CM | POA: Diagnosis not present

## 2022-08-03 DIAGNOSIS — R059 Cough, unspecified: Secondary | ICD-10-CM | POA: Diagnosis not present

## 2022-08-03 DIAGNOSIS — I2510 Atherosclerotic heart disease of native coronary artery without angina pectoris: Secondary | ICD-10-CM | POA: Diagnosis not present

## 2022-08-03 DIAGNOSIS — J9610 Chronic respiratory failure, unspecified whether with hypoxia or hypercapnia: Secondary | ICD-10-CM | POA: Diagnosis not present

## 2022-08-03 DIAGNOSIS — J449 Chronic obstructive pulmonary disease, unspecified: Secondary | ICD-10-CM | POA: Diagnosis not present

## 2022-08-05 DIAGNOSIS — I69359 Hemiplegia and hemiparesis following cerebral infarction affecting unspecified side: Secondary | ICD-10-CM | POA: Diagnosis not present

## 2022-08-05 DIAGNOSIS — I4891 Unspecified atrial fibrillation: Secondary | ICD-10-CM | POA: Diagnosis not present

## 2022-08-05 DIAGNOSIS — A419 Sepsis, unspecified organism: Secondary | ICD-10-CM | POA: Diagnosis not present

## 2022-08-05 DIAGNOSIS — J449 Chronic obstructive pulmonary disease, unspecified: Secondary | ICD-10-CM | POA: Diagnosis not present

## 2022-08-06 DIAGNOSIS — I482 Chronic atrial fibrillation, unspecified: Secondary | ICD-10-CM | POA: Diagnosis not present

## 2022-08-06 DIAGNOSIS — J9610 Chronic respiratory failure, unspecified whether with hypoxia or hypercapnia: Secondary | ICD-10-CM | POA: Diagnosis not present

## 2022-08-06 DIAGNOSIS — R143 Flatulence: Secondary | ICD-10-CM | POA: Diagnosis not present

## 2022-08-06 DIAGNOSIS — J449 Chronic obstructive pulmonary disease, unspecified: Secondary | ICD-10-CM | POA: Diagnosis not present

## 2022-08-07 ENCOUNTER — Ambulatory Visit (INDEPENDENT_AMBULATORY_CARE_PROVIDER_SITE_OTHER): Payer: Medicare HMO

## 2022-08-07 ENCOUNTER — Other Ambulatory Visit: Payer: Self-pay

## 2022-08-07 DIAGNOSIS — Z0000 Encounter for general adult medical examination without abnormal findings: Secondary | ICD-10-CM | POA: Diagnosis not present

## 2022-08-07 DIAGNOSIS — F17200 Nicotine dependence, unspecified, uncomplicated: Secondary | ICD-10-CM

## 2022-08-07 NOTE — Progress Notes (Signed)
Subjective:   Zachary Chambers is a 71 y.o. male who presents for Medicare Annual/Subsequent preventive examination.  Review of Systems    Cardiac Risk Factors include: advanced age (>34mn, >>17women) Respiratory Musculoskeletal and integument     Objective:    There were no vitals filed for this visit. There is no height or weight on file to calculate BMI.     08/07/2022    2:29 PM 07/27/2022    5:55 PM 06/06/2022    2:11 AM 11/04/2021    6:12 PM 08/01/2021    2:33 PM 04/28/2021    3:41 PM 04/19/2021    1:00 PM  Advanced Directives  Does Patient Have a Medical Advance Directive? Yes Yes No Yes Yes Yes Yes  Type of Advance Directive Living will Living will  [place] Living will;Healthcare Power of Attorney  Does patient want to make changes to medical advance directive?  No - Patient declined  No - Patient declined   No - Patient declined  Copy of [place] Chart?    No - copy requested No - copy requested No - copy requested No - copy requested  Would patient like information on creating a medical advance directive?     No - Patient declined No - Patient declined No - Guardian declined    Current Medications (verified) Outpatient Encounter Medications as of 08/07/2022  Medication Sig   acetaminophen (TYLENOL) 325 MG tablet Take 2 tablets (650 mg total) by mouth every 6 (six) hours as needed for mild pain (or Fever >/= 101).   albuterol (PROVENTIL) (2.5 MG/3ML) 0.083% nebulizer solution Take 3 mLs (2.5 mg total) by nebulization every 4 (four) hours as needed for wheezing or shortness of breath.   atorvastatin (LIPITOR) 40 MG tablet Take 1 tablet (40 mg total) by mouth at bedtime.   benzonatate (TESSALON PERLES) 100 MG capsule Take 1 capsule (100 mg total) by mouth 3 (three) times daily as needed for cough.   Blood Glucose Calibration (TRUE METRIX LEVEL 1) Low  SOLN USE AS DIRECTED. To monitor fsbs 2 times per day dx e11.9   Blood Glucose Monitoring Suppl (TRUE METRIX AIR GLUCOSE METER) DEVI USE AS DIRECTED. To monitor fsbs 2 times per day dx e11.9   BREZTRI AEROSPHERE 160-9-4.8 MCG/ACT AERO INHALE 2 PUFFS into lungs 2 TIMES DAILY (Patient taking differently: Inhale 2 puffs into the lungs in the morning and at bedtime.)   Cholecalciferol (D3 PO) Take 1 tablet by mouth daily.   clotrimazole-betamethasone (LOTRISONE) cream APPLY TO SKIN 2 TIMES DAILY   Cyanocobalamin (VITAMIN B-12 PO) Take 1 tablet by mouth daily.   ELIQUIS 5 MG TABS tablet TAKE 1 TABLET BY MOUTH 2 TIMES DAILY   feeding supplement (ENSURE ENLIVE / ENSURE PLUS) LIQD Take 237 mLs by mouth 2 (two) times daily between meals.   fluticasone (FLONASE) 50 MCG/ACT nasal spray Place 2 sprays into both nostrils daily.   glucose blood (RELION TRUE METRIX TEST STRIPS) test strip USE AS DIRECTED. To monitor fsbs 2 times per day dx e11.9   isosorbide mononitrate (IMDUR) 30 MG 24 hr tablet TAKE 1/2 TABLET BY MOUTH EVERY DAY   magnesium oxide (MAG-OX) 400 (240 Mg) MG tablet Take 1 tablet (400 mg total) by mouth daily.   metoprolol tartrate (LOPRESSOR) 50 MG tablet Take 1 tablet (50 mg total) by mouth 2 (two) times daily.   Multiple Vitamin (  MULTIVITAMIN WITH MINERALS) TABS tablet Take 1 tablet by mouth daily.   nitroGLYCERIN (NITROSTAT) 0.4 MG SL tablet DISSOLVE 1 TABLET UNDER THE TONGUE EVERY 5 MINUTES AS NEEDED FOR CHEST PAIN   omeprazole (PRILOSEC) 40 MG capsule Take 1 capsule (40 mg total) by mouth daily.   predniSONE (DELTASONE) 10 MG tablet Take 1 tablet (10 mg total) by mouth daily with breakfast.   QUEtiapine (SEROQUEL) 25 MG tablet Take 1 tablet (25 mg total) by mouth at bedtime.   No facility-administered encounter medications on file as of 08/07/2022.    Allergies (verified) Patient has no known allergies.   History: Past Medical History:  Diagnosis Date   ACS (acute coronary syndrome)  ([place]) 04/19/2021   Alcohol abuse    Aortic atherosclerosis (HCC)    CAD in native artery    Chronic respiratory failure (HCC)    COPD (chronic obstructive pulmonary disease) (HCC)    DJD (degenerative joint disease)    diffusely   GERD (gastroesophageal reflux disease)    Hyperlipidemia    On home O2    Paroxysmal atrial fibrillation (HCC)    Pericardial effusion    Pneumonia due to COVID-19 virus    PUD (peptic ulcer disease)    with bleeding   Rotator cuff disorder    has been evaluated by Dr Clifton James and [facility]   Stroke (cerebrum) [facility])    Past Surgical History:  Procedure Laterality Date   COLONOSCOPY     EYE SURGERY     right eye removed at age 67   heart stent     x3- 2006   Family History  Problem Relation Age of Onset   Ovarian cancer Mother    Diabetes Maternal Grandfather    COPD Father    Diabetes Paternal Grandfather    Colon cancer Paternal Grandfather    Esophageal cancer Neg Hx    Rectal cancer Neg Hx    Stomach cancer Neg Hx    Social History   Socioeconomic History   Marital status: Single    Spouse name: Not on file   Number of children: Not on file   Years of education: Not on file   Highest education level: Not on file  Occupational History   Occupation: retired  Tobacco Use   Smoking status: Former    Packs/day: 1.00    Years: 40.00    Total pack years: 40.00    Types: Cigarettes   Smokeless tobacco: Never   Tobacco comments:    declines patch  Vaping Use   Vaping Use: Never used  Substance and Sexual Activity   Alcohol use: Yes    Alcohol/week: 30.0 standard drinks of alcohol    Types: 30 Cans of beer per week    Comment: h/o heavy use, now 4 beers per day   Drug use: Not Currently   Sexual activity: Yes  Other Topics Concern   Not on file  Social History Narrative   Right Handed   Lives in one story home       Patient has not smoked for 3 months   Patient on 2L of Oxygen    Social Determinants of Health   Financial  Resource Strain: Low Risk  (08/07/2022)   Overall Financial Resource Strain (CARDIA)    Difficulty of Paying Living Expenses: Not very hard  Food Insecurity: No Food Insecurity (08/07/2022)   Hunger Vital Sign    Worried About Running Out of Food in the Last Year: Never true  Ran Out of Food in the Last Year: Never true  Transportation Needs: No Transportation Needs (08/07/2022)   PRAPARE - Hydrologist (Medical): No    Lack of Transportation (Non-Medical): No  Physical Activity: Inactive (08/07/2022)   Exercise Vital Sign    Days of Exercise per Week: 0 days    Minutes of Exercise per Session: 0 min  Stress: No Stress Concern Present (08/07/2022)   [place]    Feeling of Stress : Only a little  Social Connections: Socially Isolated (08/07/2022)   Social Connection and Isolation Panel [NHANES]    Frequency of Communication with Friends and Family: Once a week    Frequency of Social Gatherings with Friends and Family: Once a week    Attends Religious Services: Never    Marine scientist or Organizations: No    Attends Music therapist: Never    Marital Status: Married    Tobacco Counseling Counseling given: Not Answered Tobacco comments: declines patch   Clinical Intake:  Pre-visit preparation completed: Yes  Pain : No/denies pain     BMI - recorded: 19.01 Nutritional Status: BMI of 19-24  Normal Diabetes: No  How often do you need to have someone help you when you read instructions, pamphlets, or other written materials from your doctor or pharmacy?: 1 - Never What is the last grade level you completed in school?: college  Diabetic?n/a  Interpreter Needed?: No      Activities of Daily Living    08/07/2022    2:29 PM 07/27/2022    6:04 PM  In your present state of health, do you have any difficulty performing the following activities:   Hearing? 0   Vision? 1   Difficulty concentrating or making decisions? 1   Walking or climbing stairs? 1   Dressing or bathing? 1   Doing errands, shopping? 1 0  Preparing Food and eating ? Y   Using the Toilet? N   In the past six months, have you accidently leaked urine? Y   Do you have problems with loss of bowel control? N   Managing your Medications? Y   Managing your Finances? Y   Housekeeping or managing your Housekeeping? N     Patient Care Team: [name], MD as PCP - General (Family Medicine) Josue Hector, MD as PCP - Cardiology (Cardiology) Edythe Clarity, [facility] as Pharmacist (Pharmacist) Pieter Partridge, DO as Consulting Physician (Neurology)  Indicate any recent Medical Services you may have received from other than [facility] providers in the past year (date may be approximate).     Assessment:   This is a routine wellness examination for [place].  Hearing/Vision screen No results found.  Dietary issues and exercise activities discussed:     Goals Addressed             This Visit's Progress    COMPLETED: Exercise 3x per week (30 min per time)   On track    Increase exercise as tolerated.        Depression Screen    08/07/2022    2:27 PM 08/01/2021    2:27 PM 01/17/2021   12:42 PM 11/06/2019    2:13 PM 07/07/2019    3:14 PM 02/01/2019   10:25 AM 03/04/2018    2:59 PM  PHQ 2/9 Scores  PHQ - 2 Score 0 0 0 0 0 0 1  PHQ- 9 Score  3    Fall Risk    08/07/2022    2:05 PM 08/01/2021    2:34 PM 09/12/2020    1:20 PM 11/06/2019    2:13 PM 07/07/2019    3:14 PM  Fall Risk   Falls in the past year? 0 0 0 0 0  Number falls in past yr:  0 0    Injury with Fall?  0 0    Risk for fall due to :  Other (Comment);Impaired balance/gait  No Fall Risks   Risk for fall due to: Comment  Currently on oxygen.     Follow up  Falls prevention discussed  Falls evaluation completed Falls evaluation completed    [place]:  Any stairs in or around the home? No  If so, are there any without handrails? No  Home free of loose throw rugs in walkways, pet beds, electrical cords, etc? Yes  Adequate lighting in your home to reduce risk of falls? No   ASSISTIVE DEVICES UTILIZED TO PREVENT FALLS:  Life alert? No  Use of a cane, walker or w/c? Yes  Grab bars in the bathroom? Yes  Shower chair or bench in shower? Yes  Elevated toilet seat or a handicapped toilet? No   TIMED UP AND GO:  Was the test performed? No .  Length of time to ambulate 10 feet: n/a sec.     Cognitive Function:        08/07/2022    2:33 PM 08/01/2021    2:38 PM  6CIT Screen  What Year? 0 points 0 points  What month? 0 points 0 points  What time? 3 points 0 points  Count back from 20 4 points 2 points  Months in reverse 0 points 2 points  Repeat phrase 2 points 6 points  Total Score 9 points 10 points    Immunizations Immunization History  Administered Date(s) Administered   Fluad Quad(high Dose 65+) 07/07/2019, 08/05/2020, 05/07/2022   Influenza Whole 07/25/2009   Influenza, High Dose Seasonal PF 09/03/2017, 08/05/2018   Influenza,inj,Quad PF,6+ Mos 06/19/2013, 08/15/2014, 07/17/2015, 07/15/2016   PNEUMOCOCCAL CONJUGATE-20 05/07/2022   Pneumococcal Conjugate-13 08/15/2014   Pneumococcal Polysaccharide-23 08/26/2016   Tdap 04/28/2021   Zoster Recombinat (Shingrix) 03/07/2018, 06/15/2018, 05/07/2022    TDAP status: Up to date  Flu Vaccine status: Up to date  Pneumococcal vaccine status: Up to date  Covid-19 vaccine status: Information provided on how to obtain vaccines.   Qualifies for Shingles Vaccine? Yes   Zostavax completed Yes   Shingrix Completed?: Yes  Screening Tests Health Maintenance  Topic Date Due   COVID-19 Vaccine (1) Never done   COLONOSCOPY (Pts 45-50yr Insurance coverage will need to be confirmed)  03/09/2021   Lung Cancer Screening  12/09/2021   Medicare Annual Wellness (AWV)   08/08/2023   DTaP/Tdap/Td (2 - Td or Tdap) 04/29/2031   Pneumonia Vaccine 71 Years old  Completed   INFLUENZA VACCINE  Completed   Hepatitis C Screening  Completed   Zoster Vaccines- Shingrix  Completed   HPV VACCINES  Aged Out    Health Maintenance  Health Maintenance Due  Topic Date Due   COVID-19 Vaccine (1) Never done   COLONOSCOPY (Pts 45-429yrInsurance coverage will need to be confirmed)  03/09/2021   Lung Cancer Screening  12/09/2021    Colorectal cancer screening: Type of screening: Colonoscopy. Completed 7/17. Repeat every 5 years  Lung Cancer Screening: (Low Dose CT Chest recommended if Age  55-80 years, 30 pack-year currently smoking OR have quit w/in 15years.) does qualify.   Lung Cancer Screening Referral: referral send 08/07/22  Additional Screening:  Hepatitis C Screening: does not qualify; Completed n/a  Vision Screening: Recommended annual ophthalmology exams for early detection of glaucoma and other disorders of the eye. Is the patient up to date with their annual eye exam?  No  Who is the provider or what is the name of the office in which the patient attends annual eye exams? Per pt [facility] in [place] If pt is not established with a provider, would they like to be referred to a provider to establish care? No .   Dental Screening: Recommended annual dental exams for proper oral hygiene  Community Resource Referral / Chronic Care Management: CRR required this visit?  No   CCM required this visit?  No      Plan:     I have personally reviewed and noted the following in the patient's chart:   Medical and social history Use of alcohol, tobacco or illicit drugs  Current medications and supplements including opioid prescriptions. Patient is not currently taking opioid prescriptions. Functional ability and status Nutritional status Physical activity Advanced directives List of other physicians Hospitalizations, surgeries, and ER visits in  previous 12 months Vitals Screenings to include cognitive, depression, and falls Referrals and appointments  In addition, I have reviewed and discussed with patient certain preventive protocols, quality metrics, and best practice recommendations. A written personalized care plan for preventive services as well as general preventive health recommendations were provided to patient.     Colman Cater, [place]   08/07/2022   Nurse Notes: n/a

## 2022-08-14 ENCOUNTER — Other Ambulatory Visit: Payer: Self-pay | Admitting: Family Medicine

## 2022-08-20 DIAGNOSIS — R627 Adult failure to thrive: Secondary | ICD-10-CM | POA: Diagnosis not present

## 2022-08-20 DIAGNOSIS — J449 Chronic obstructive pulmonary disease, unspecified: Secondary | ICD-10-CM | POA: Diagnosis not present

## 2022-08-20 DIAGNOSIS — F32A Depression, unspecified: Secondary | ICD-10-CM | POA: Diagnosis not present

## 2022-08-20 DIAGNOSIS — I482 Chronic atrial fibrillation, unspecified: Secondary | ICD-10-CM | POA: Diagnosis not present

## 2022-08-20 DIAGNOSIS — J9610 Chronic respiratory failure, unspecified whether with hypoxia or hypercapnia: Secondary | ICD-10-CM | POA: Diagnosis not present

## 2022-08-26 ENCOUNTER — Other Ambulatory Visit: Payer: Self-pay | Admitting: *Deleted

## 2022-08-26 NOTE — Patient Outreach (Signed)
EMMI notification received stating there is not a scheduled follow up after Zachary Chambers's discharge from [facility] on 08/21/22.  Telephone call made to Zachary Chambers [phone]. Patient identifiers confirmed. Zachary Chambers confirms he has not scheduled PCP appointment on purpose. States both he and Zachary Chambers (significant other) were discussing it last night. Did not discuss further details. Writer offered to assist making an appointment with his Primary Care Provider. Zachary Chambers declined. Zachary Chambers stated Zachary Chambers is on top of everything.  States, however, "it has been overwhelmingChief Technology Officer offered to make referral to [facility] care coordination team for further assistance. Zachary Chambers declined. Restated Zachary Chambers is "on top of everything."  Zachary Chambers gave Probation officer permission to call Zachary Chambers to offer assistance if needed.   Telephone call made to Zachary Chambers (significant other/DPR) [phone]. No answer. HIPAA compliant voicemail message left to request return call.   Marthenia Rolling, MSN, RN,BSN [place] Acute Care Coordinator [phone] (Direct dial)

## 2022-08-30 NOTE — Progress Notes (Deleted)
CARDIOLOGY CONSULT NOTE      Primary Physician: [name], MD Primary Cardiologist: [name]  F/U CAD  HPI:  72 y.o.with history of CAD with PCI circumflex and then LAD last 2003 Has COPD still smoking Has vascular disease with diagnosis of AAA but diameter < 3.0 on duplex 2018 Presented with SSCP He r/o with no acute ECG changes Myovue 03/08/20 no ischemia low risk study EF 63% d/c home   He follows with [facility] Quit smoking 06/2020 after COVID pneumonia d/c on 2L oxygen He is very inactive and in wheel chair mostly Severe COPD F/F ratio 45% DLCO 47% Needs up to 5L with activity   History of CVA 08/29/20 seen by neurology on low dose eliquis  Carotids plaque no stenosis 08/19/18  CTA head 10/04/20 no vascular stenosis old left frontoparietal infarct He does have a history of PAF   Hospitalized December 2023 with sepsis from UTI Weakness and frequent falls Completed 5 day outpatient course Cephalexin after IV Rx and d/c to SNF for PT/OT with prednisone taper for COPD   ***  ROS All other systems reviewed and negative except as noted above  Past Medical History:  Diagnosis Date   ACS (acute coronary syndrome) ([place]) 04/19/2021   Alcohol abuse    Aortic atherosclerosis (HCC)    CAD in native artery    Chronic respiratory failure (HCC)    COPD (chronic obstructive pulmonary disease) (HCC)    DJD (degenerative joint disease)    diffusely   GERD (gastroesophageal reflux disease)    Hyperlipidemia    On home O2    Paroxysmal atrial fibrillation (HCC)    Pericardial effusion    Pneumonia due to COVID-19 virus    PUD (peptic ulcer disease)    with bleeding   Rotator cuff disorder    has been evaluated by Dr Clifton James and [facility]   Stroke (cerebrum) [facility])     Family History  Problem Relation Age of Onset   Ovarian cancer Mother    Diabetes Maternal Grandfather    COPD Father    Diabetes Paternal Grandfather    Colon cancer Paternal Grandfather    Esophageal cancer  Neg Hx    Rectal cancer Neg Hx    Stomach cancer Neg Hx     Social History   Socioeconomic History   Marital status: Single    Spouse name: Not on file   Number of children: Not on file   Years of education: Not on file   Highest education level: Not on file  Occupational History   Occupation: retired  Tobacco Use   Smoking status: Former    Packs/day: 1.00    Years: 40.00    Total pack years: 40.00    Types: Cigarettes   Smokeless tobacco: Never   Tobacco comments:    declines patch  Vaping Use   Vaping Use: Never used  Substance and Sexual Activity   Alcohol use: Yes    Alcohol/week: 30.0 standard drinks of alcohol    Types: 30 Cans of beer per week    Comment: h/o heavy use, now 4 beers per day   Drug use: Not Currently   Sexual activity: Yes  Other Topics Concern   Not on file  Social History Narrative   Right Handed   Lives in one story home       Patient has not smoked for 3 months   Patient on 2L of Oxygen    Social Determinants of  Health   Financial Resource Strain: Low Risk  (08/07/2022)   Overall Financial Resource Strain (CARDIA)    Difficulty of Paying Living Expenses: Not very hard  Food Insecurity: No Food Insecurity (08/07/2022)   Hunger Vital Sign    Worried About Running Out of Food in the Last Year: Never true    Ran Out of Food in the Last Year: Never true  Transportation Needs: No Transportation Needs (08/07/2022)   PRAPARE - Hydrologist (Medical): No    Lack of Transportation (Non-Medical): No  Physical Activity: Inactive (08/07/2022)   Exercise Vital Sign    Days of Exercise per Week: 0 days    Minutes of Exercise per Session: 0 min  Stress: No Stress Concern Present (08/07/2022)   [place]    Feeling of Stress : Only a little  Social Connections: Socially Isolated (08/07/2022)   Social Connection and Isolation Panel [NHANES]     Frequency of Communication with Friends and Family: Once a week    Frequency of Social Gatherings with Friends and Family: Once a week    Attends Religious Services: Never    Marine scientist or Organizations: No    Attends Archivist Meetings: Never    Marital Status: Married  Human resources officer Violence: Not At Risk (08/07/2022)   Humiliation, Afraid, Rape, and Kick questionnaire    Fear of Current or Ex-Partner: No    Emotionally Abused: No    Physically Abused: No    Sexually Abused: No    Past Surgical History:  Procedure Laterality Date   COLONOSCOPY     EYE SURGERY     right eye removed at age 55   heart stent     x3- 2006      Current Outpatient Medications:    acetaminophen (TYLENOL) 325 MG tablet, Take 2 tablets (650 mg total) by mouth every 6 (six) hours as needed for mild pain (or Fever >/= 101)., Disp: , Rfl:    albuterol (PROVENTIL) (2.5 MG/3ML) 0.083% nebulizer solution, Take 3 mLs (2.5 mg total) by nebulization every 4 (four) hours as needed for wheezing or shortness of breath., Disp: 180 mL, Rfl: 4   atorvastatin (LIPITOR) 40 MG tablet, Take 1 tablet (40 mg total) by mouth at bedtime., Disp: 90 tablet, Rfl: 3   benzonatate (TESSALON PERLES) 100 MG capsule, Take 1 capsule (100 mg total) by mouth 3 (three) times daily as needed for cough., Disp: 60 capsule, Rfl: 11   Blood Glucose Calibration (TRUE METRIX LEVEL 1) Low SOLN, USE AS DIRECTED. To monitor fsbs 2 times per day dx e11.9, Disp: 1 each, Rfl: 3   Blood Glucose Monitoring Suppl (TRUE METRIX AIR GLUCOSE METER) DEVI, USE AS DIRECTED. To monitor fsbs 2 times per day dx e11.9, Disp: 1 each, Rfl: 3   Budeson-Glycopyrrol-Formoterol (BREZTRI AEROSPHERE) 160-9-4.8 MCG/ACT AERO, INHALE 2 PUFFS TWICE DAILY, Disp: 1 each, Rfl: 1   Cholecalciferol (D3 PO), Take 1 tablet by mouth daily., Disp: , Rfl:    clotrimazole-betamethasone (LOTRISONE) cream, APPLY TO SKIN 2 TIMES DAILY, Disp: 30 g, Rfl: 1    Cyanocobalamin (VITAMIN B-12 PO), Take 1 tablet by mouth daily., Disp: , Rfl:    ELIQUIS 5 MG TABS tablet, TAKE 1 TABLET BY MOUTH 2 TIMES DAILY, Disp: 60 tablet, Rfl: 3   feeding supplement (ENSURE ENLIVE / ENSURE PLUS) LIQD, Take 237 mLs by mouth 2 (two) times daily between meals.,  Disp: 14220 mL, Rfl: 0   fluticasone (FLONASE) 50 MCG/ACT nasal spray, Place 2 sprays into both nostrils daily., Disp: 48 g, Rfl: 3   glucose blood (RELION TRUE METRIX TEST STRIPS) test strip, USE AS DIRECTED. To monitor fsbs 2 times per day dx e11.9, Disp: 100 each, Rfl: 3   isosorbide mononitrate (IMDUR) 30 MG 24 hr tablet, TAKE 1/2 TABLET BY MOUTH EVERY DAY, Disp: 45 tablet, Rfl: 3   magnesium oxide (MAG-OX) 400 (240 Mg) MG tablet, Take 1 tablet (400 mg total) by mouth daily., Disp: 100 tablet, Rfl: 0   metoprolol tartrate (LOPRESSOR) 50 MG tablet, Take 1 tablet (50 mg total) by mouth 2 (two) times daily., Disp: 60 tablet, Rfl: 3   Multiple Vitamin (MULTIVITAMIN WITH MINERALS) TABS tablet, Take 1 tablet by mouth daily., Disp: 90 tablet, Rfl: 0   nitroGLYCERIN (NITROSTAT) 0.4 MG SL tablet, DISSOLVE 1 TABLET UNDER THE TONGUE EVERY 5 MINUTES AS NEEDED FOR CHEST PAIN, Disp: 25 tablet, Rfl: 0   omeprazole (PRILOSEC) 40 MG capsule, Take 1 capsule (40 mg total) by mouth daily., Disp: 90 capsule, Rfl: 3   predniSONE (DELTASONE) 10 MG tablet, Take 1 tablet (10 mg total) by mouth daily with breakfast., Disp: 30 tablet, Rfl: 0   QUEtiapine (SEROQUEL) 25 MG tablet, Take 1 tablet (25 mg total) by mouth at bedtime., Disp: 90 tablet, Rfl: 3    Physical Exam: There were no vitals taken for this visit.    Chronically ill COPD with exp wheezes Distant heart sounds Abdomen benign no AAA Trace edema Palpable pedal pulses    Labs:   Lab Results  Component Value Date   WBC 10.0 07/30/2022   HGB 11.9 (L) 07/30/2022   HCT 37.9 (L) 07/30/2022   MCV 85.6 07/30/2022   PLT 360 07/30/2022   No results for input(s): "NA", "K",  "CL", "CO2", "BUN", "CREATININE", "CALCIUM", "PROT", "BILITOT", "ALKPHOS", "ALT", "AST", "GLUCOSE" in the last 168 hours.  Invalid input(s): "LABALBU" No results found for: "CKTOTAL", "CKMB", "CKMBINDEX", "TROPONINI"  Lab Results  Component Value Date   CHOL 154 03/12/2021   CHOL 118 03/08/2020   CHOL 137 11/06/2019   Lab Results  Component Value Date   HDL 55 03/12/2021   HDL 49 03/08/2020   HDL 64 11/06/2019   Lab Results  Component Value Date   LDLCALC 92 03/12/2021   LDLCALC 61 03/08/2020   LDLCALC 60 11/06/2019   Lab Results  Component Value Date   TRIG 35 03/12/2021   TRIG 46 07/04/2020   TRIG 41 03/08/2020   Lab Results  Component Value Date   CHOLHDL 2.8 03/12/2021   CHOLHDL 2.4 03/08/2020   CHOLHDL 2.1 11/06/2019   No results found for: "LDLDIRECT"    Radiology: No results found.  EKG: afib rapid rate 07/05/20    ASSESSMENT AND PLAN:   1. CAD:  Stable no angina distant PCI circumflex /LAD non ischemic myovue July 2021 continue medical Rx given co morbidities  2. COPD: chronic made worse by COVID pneumonia January f/u [facility] continue oxygen nebs Stopped smoking 06/2020   3. PAF:  Not well documented on eliquis with history of TIA  In NSR on ECG done 07/27/22   4. CVA: f/u neurology likely related above carotids CTA neck normal   5. HLD:  On statin labs with primary   F/U in a year   Signed: [name] 08/30/2022, 10:54 AM

## 2022-08-31 ENCOUNTER — Other Ambulatory Visit: Payer: Self-pay | Admitting: *Deleted

## 2022-08-31 ENCOUNTER — Telehealth: Payer: Self-pay | Admitting: Family Medicine

## 2022-08-31 ENCOUNTER — Telehealth: Payer: Self-pay | Admitting: *Deleted

## 2022-08-31 DIAGNOSIS — I2583 Coronary atherosclerosis due to lipid rich plaque: Secondary | ICD-10-CM | POA: Diagnosis not present

## 2022-08-31 DIAGNOSIS — E785 Hyperlipidemia, unspecified: Secondary | ICD-10-CM | POA: Diagnosis not present

## 2022-08-31 DIAGNOSIS — J449 Chronic obstructive pulmonary disease, unspecified: Secondary | ICD-10-CM | POA: Diagnosis not present

## 2022-08-31 DIAGNOSIS — J9611 Chronic respiratory failure with hypoxia: Secondary | ICD-10-CM

## 2022-08-31 DIAGNOSIS — A4150 Gram-negative sepsis, unspecified: Secondary | ICD-10-CM | POA: Diagnosis not present

## 2022-08-31 DIAGNOSIS — I10 Essential (primary) hypertension: Secondary | ICD-10-CM | POA: Diagnosis not present

## 2022-08-31 DIAGNOSIS — L89322 Pressure ulcer of left buttock, stage 2: Secondary | ICD-10-CM | POA: Diagnosis not present

## 2022-08-31 DIAGNOSIS — K219 Gastro-esophageal reflux disease without esophagitis: Secondary | ICD-10-CM | POA: Diagnosis not present

## 2022-08-31 DIAGNOSIS — M6281 Muscle weakness (generalized): Secondary | ICD-10-CM | POA: Diagnosis not present

## 2022-08-31 DIAGNOSIS — I639 Cerebral infarction, unspecified: Secondary | ICD-10-CM | POA: Diagnosis not present
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
## 2022-08-31 NOTE — Progress Notes (Unsigned)
  Care Coordination  Outreach Note  08/31/2022 Name: Zachary Chambers MRN: [id number] DOB: [birth year]   Care Coordination Outreach Attempts: An unsuccessful telephone outreach was attempted today to offer the patient information about available care coordination services as a benefit of their health plan.   Follow Up Plan:  Additional outreach attempts will be made to offer the patient care coordination information and services.   Encounter Outcome:  No Answer  [place]  Direct Dial: [phone]

## 2022-08-31 NOTE — Telephone Encounter (Signed)
Received call from [facility] to request samples of Eliquis; requesting a month's supply.  Samples left at front desk (gave enough for 35 days) Lelon Frohlich will pick them up before 5pm tomorrow.

## 2022-09-01 NOTE — Progress Notes (Signed)
  Care Coordination   Note   09/01/2022 Name: Zachary Chambers MRN: [id number] DOB: 1951-08-01  Zachary Chambers is a 72 y.o. year old male who sees Pickard, Cammie Mcgee, MD for primary care. I reached out to Janeann Merl by phone today to offer care coordination services.  Mr. Donatelli was given information about Care Coordination services today including:   The Care Coordination services include support from the care team which includes your Nurse Coordinator, Clinical Social Worker, or Pharmacist.  The Care Coordination team is here to help remove barriers to the health concerns and goals most important to you. Care Coordination services are voluntary, and the patient may decline or stop services at any time by request to their care team member.   Care Coordination Consent Status: Patient agreed to services and verbal consent obtained.   Follow up plan:  Telephone appointment with care coordination team member scheduled for:  09/01/22 with SW and 09/02/22 with RNCM   Encounter Outcome:  Pt. Scheduled  [place]  Direct Dial: [phone]

## 2022-09-03 ENCOUNTER — Ambulatory Visit: Payer: Medicare HMO | Admitting: Family Medicine

## 2022-09-03 ENCOUNTER — Ambulatory Visit: Payer: Self-pay

## 2022-09-03 DIAGNOSIS — E785 Hyperlipidemia, unspecified: Secondary | ICD-10-CM | POA: Diagnosis not present

## 2022-09-03 DIAGNOSIS — A4150 Gram-negative sepsis, unspecified: Secondary | ICD-10-CM | POA: Diagnosis not present

## 2022-09-03 DIAGNOSIS — L89322 Pressure ulcer of left buttock, stage 2: Secondary | ICD-10-CM | POA: Diagnosis not present

## 2022-09-03 DIAGNOSIS — I2583 Coronary atherosclerosis due to lipid rich plaque: Secondary | ICD-10-CM | POA: Diagnosis not present

## 2022-09-03 DIAGNOSIS — M6281 Muscle weakness (generalized): Secondary | ICD-10-CM | POA: Diagnosis not present

## 2022-09-03 DIAGNOSIS — K219 Gastro-esophageal reflux disease without esophagitis: Secondary | ICD-10-CM | POA: Diagnosis not present

## 2022-09-03 DIAGNOSIS — I10 Essential (primary) hypertension: Secondary | ICD-10-CM | POA: Diagnosis not present

## 2022-09-03 DIAGNOSIS — J449 Chronic obstructive pulmonary disease, unspecified: Secondary | ICD-10-CM | POA: Diagnosis not present

## 2022-09-03 DIAGNOSIS — I639 Cerebral infarction, unspecified: Secondary | ICD-10-CM | POA: Diagnosis not present

## 2022-09-03 NOTE — Patient Outreach (Signed)
  Care Coordination   09/03/2022 Name: Zachary Chambers MRN: [id number] DOB: May 31, 1951   Care Coordination Outreach Attempts:  An unsuccessful telephone outreach was attempted today to offer the patient information about available care coordination services as a benefit of their health plan.   Follow Up Plan:  Additional outreach attempts will be made to offer the patient care coordination information and services.   Encounter Outcome:  No Answer   Care Coordination Interventions:  No, not indicated    Jone Baseman, RN, MSN [place] [facility] Care Management Coordinator Direct Line [phone]

## 2022-09-09 ENCOUNTER — Telehealth: Payer: Self-pay | Admitting: *Deleted

## 2022-09-09 DIAGNOSIS — I10 Essential (primary) hypertension: Secondary | ICD-10-CM | POA: Diagnosis not present

## 2022-09-09 DIAGNOSIS — I2583 Coronary atherosclerosis due to lipid rich plaque: Secondary | ICD-10-CM | POA: Diagnosis not present

## 2022-09-09 DIAGNOSIS — L89322 Pressure ulcer of left buttock, stage 2: Secondary | ICD-10-CM | POA: Diagnosis not present

## 2022-09-09 DIAGNOSIS — J449 Chronic obstructive pulmonary disease, unspecified: Secondary | ICD-10-CM | POA: Diagnosis not present

## 2022-09-09 DIAGNOSIS — M6281 Muscle weakness (generalized): Secondary | ICD-10-CM | POA: Diagnosis not present

## 2022-09-09 DIAGNOSIS — I639 Cerebral infarction, unspecified: Secondary | ICD-10-CM | POA: Diagnosis not present

## 2022-09-09 DIAGNOSIS — K219 Gastro-esophageal reflux disease without esophagitis: Secondary | ICD-10-CM | POA: Diagnosis not present

## 2022-09-09 DIAGNOSIS — E785 Hyperlipidemia, unspecified: Secondary | ICD-10-CM | POA: Diagnosis not present

## 2022-09-09 DIAGNOSIS — A4150 Gram-negative sepsis, unspecified: Secondary | ICD-10-CM | POA: Diagnosis not present

## 2022-09-09 NOTE — Progress Notes (Signed)
  Care Coordination Note  09/09/2022 Name: Zachary Chambers, Zachary Chambers  Zachary Chambers is a 72 y.o. year old male who is a primary care patient of Dennard Schaumann, Cammie Mcgee, MD and is actively engaged with the care management team. I reached out to Janeann Merl by phone today to assist with re-scheduling an initial visit with the RN Case Manager  Follow up plan: Telephone appointment with care management team member scheduled for:09/11/22  [place]: [phone]

## 2022-09-09 NOTE — Telephone Encounter (Signed)
Mrs. Zachary Chambers returned the call. She will call you back on the number provided in the previous message.

## 2022-09-09 NOTE — Progress Notes (Signed)
  Care Coordination Note  09/09/2022 Name: Zachary Chambers MRN: [id number] DOB: July 03, 1951  [name] is a 72 y.o. year old male who is a primary care patient of Dennard Schaumann, Cammie Mcgee, MD and is actively engaged with the care management team. I reached out to Janeann Merl by phone today to assist with re-scheduling an initial visit with the RN Case Manager  Follow up plan: Unsuccessful telephone outreach attempt made.   [place]  Direct Dial: [phone]

## 2022-09-10 ENCOUNTER — Ambulatory Visit: Payer: Medicare HMO | Attending: Cardiovascular Disease | Admitting: Cardiovascular Disease

## 2022-09-11 ENCOUNTER — Ambulatory Visit: Payer: Self-pay | Admitting: *Deleted

## 2022-09-11 NOTE — Patient Outreach (Signed)
  Care Coordination   09/11/2022 Name: JOSHA WEEKLEY MRN: [id number] DOB: September 11, 1950   Care Coordination Outreach Attempts:  An unsuccessful telephone outreach was attempted today to offer the patient information about available care coordination services as a benefit of their health plan.   Follow Up Plan:  Additional outreach attempts will be made to offer the patient care coordination information and services.   Encounter Outcome:  No Answer   Care Coordination Interventions:  No, not indicated    Raina Mina, RN Care Management Coordinator [place] Office [phone]

## 2022-09-15 DIAGNOSIS — A4150 Gram-negative sepsis, unspecified: Secondary | ICD-10-CM | POA: Diagnosis not present

## 2022-09-15 DIAGNOSIS — J449 Chronic obstructive pulmonary disease, unspecified: Secondary | ICD-10-CM | POA: Diagnosis not present

## 2022-09-15 DIAGNOSIS — M6281 Muscle weakness (generalized): Secondary | ICD-10-CM | POA: Diagnosis not present

## 2022-09-15 DIAGNOSIS — I639 Cerebral infarction, unspecified: Secondary | ICD-10-CM | POA: Diagnosis not present

## 2022-09-15 DIAGNOSIS — I2583 Coronary atherosclerosis due to lipid rich plaque: Secondary | ICD-10-CM | POA: Diagnosis not present

## 2022-09-15 DIAGNOSIS — E785 Hyperlipidemia, unspecified: Secondary | ICD-10-CM | POA: Diagnosis not present

## 2022-09-15 DIAGNOSIS — L89322 Pressure ulcer of left buttock, stage 2: Secondary | ICD-10-CM | POA: Diagnosis not present

## 2022-09-15 DIAGNOSIS — I10 Essential (primary) hypertension: Secondary | ICD-10-CM | POA: Diagnosis not present

## 2022-09-15 DIAGNOSIS — K219 Gastro-esophageal reflux disease without esophagitis: Secondary | ICD-10-CM | POA: Diagnosis not present

## 2022-09-16 ENCOUNTER — Telehealth: Payer: Self-pay | Admitting: *Deleted

## 2022-09-16 NOTE — Progress Notes (Signed)
  Care Coordination Note  09/16/2022 Name: Zachary Chambers MRN: [id number] DOB: [birth year]  Zachary Chambers is a 72 y.o. year old male who is a primary care patient of Dennard Schaumann, Cammie Mcgee, MD and is actively engaged with the care management team. I reached out to Janeann Merl by phone today to assist with re-scheduling an initial visit with the RN Case Manager  Follow up plan: Unsuccessful telephone outreach attempt made. A HIPAA compliant phone message was left for the patient providing contact information and requesting a return call.   [place]  Direct Dial: [phone]

## 2022-09-17 ENCOUNTER — Encounter: Payer: Medicare HMO | Admitting: *Deleted

## 2022-09-18 NOTE — Progress Notes (Signed)
  Care Coordination Note  09/18/2022 Name: Zachary Chambers MRN: [id number] DOB: 1951-07-25  Zachary Chambers is a 72 y.o. year old male who is a primary care patient of Dennard Schaumann, Cammie Mcgee, MD and is actively engaged with the care management team. I reached out to Janeann Merl by phone today to assist with re-scheduling an initial visit with the RN Case Manager  Follow up plan: Telephone appointment with care management team member scheduled for:09/23/22   [place]: [phone]

## 2022-09-21 ENCOUNTER — Telehealth: Payer: Self-pay

## 2022-09-21 DIAGNOSIS — K219 Gastro-esophageal reflux disease without esophagitis: Secondary | ICD-10-CM | POA: Diagnosis not present

## 2022-09-21 DIAGNOSIS — I10 Essential (primary) hypertension: Secondary | ICD-10-CM | POA: Diagnosis not present

## 2022-09-21 DIAGNOSIS — L89322 Pressure ulcer of left buttock, stage 2: Secondary | ICD-10-CM | POA: Diagnosis not present

## 2022-09-21 DIAGNOSIS — E785 Hyperlipidemia, unspecified: Secondary | ICD-10-CM | POA: Diagnosis not present

## 2022-09-21 DIAGNOSIS — A4150 Gram-negative sepsis, unspecified: Secondary | ICD-10-CM | POA: Diagnosis not present

## 2022-09-21 DIAGNOSIS — I2583 Coronary atherosclerosis due to lipid rich plaque: Secondary | ICD-10-CM | POA: Diagnosis not present

## 2022-09-21 DIAGNOSIS — J449 Chronic obstructive pulmonary disease, unspecified: Secondary | ICD-10-CM | POA: Diagnosis not present

## 2022-09-21 DIAGNOSIS — M6281 Muscle weakness (generalized): Secondary | ICD-10-CM | POA: Diagnosis not present

## 2022-09-21 DIAGNOSIS — I639 Cerebral infarction, unspecified: Secondary | ICD-10-CM | POA: Diagnosis not present
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
## 2022-09-23 ENCOUNTER — Ambulatory Visit: Payer: Self-pay | Admitting: *Deleted

## 2022-09-23 NOTE — Patient Outreach (Signed)
  Care Coordination   09/23/2022 Name: Zachary Chambers MRN: [id number] DOB: [birth year]   Care Coordination Outreach Attempts:  A second unsuccessful outreach was attempted today to offer the patient with information about available care coordination services as a benefit of their health plan.     Follow Up Plan:  Additional outreach attempts will be made to offer the patient care coordination information and services.    Left messages at the mobile numbers for wife and patient   Encounter Outcome:  No Answer   Care Coordination Interventions:  No, not indicated    Jeriah Corkum L. Lavina Hamman, RN, BSN, [place] Coordinator Office number [phone]

## 2022-09-24 DIAGNOSIS — M6281 Muscle weakness (generalized): Secondary | ICD-10-CM | POA: Diagnosis not present

## 2022-09-24 DIAGNOSIS — E785 Hyperlipidemia, unspecified: Secondary | ICD-10-CM | POA: Diagnosis not present

## 2022-09-24 DIAGNOSIS — A4150 Gram-negative sepsis, unspecified: Secondary | ICD-10-CM | POA: Diagnosis not present

## 2022-09-24 DIAGNOSIS — I639 Cerebral infarction, unspecified: Secondary | ICD-10-CM | POA: Diagnosis not present

## 2022-09-24 DIAGNOSIS — I10 Essential (primary) hypertension: Secondary | ICD-10-CM | POA: Diagnosis not present

## 2022-09-24 DIAGNOSIS — J449 Chronic obstructive pulmonary disease, unspecified: Secondary | ICD-10-CM | POA: Diagnosis not present

## 2022-09-24 DIAGNOSIS — I2583 Coronary atherosclerosis due to lipid rich plaque: Secondary | ICD-10-CM | POA: Diagnosis not present

## 2022-09-24 DIAGNOSIS — K219 Gastro-esophageal reflux disease without esophagitis: Secondary | ICD-10-CM | POA: Diagnosis not present

## 2022-09-24 DIAGNOSIS — L89322 Pressure ulcer of left buttock, stage 2: Secondary | ICD-10-CM | POA: Diagnosis not present

## 2022-09-28 DIAGNOSIS — E785 Hyperlipidemia, unspecified: Secondary | ICD-10-CM | POA: Diagnosis not present

## 2022-09-28 DIAGNOSIS — I2583 Coronary atherosclerosis due to lipid rich plaque: Secondary | ICD-10-CM | POA: Diagnosis not present

## 2022-09-28 DIAGNOSIS — A4150 Gram-negative sepsis, unspecified: Secondary | ICD-10-CM | POA: Diagnosis not present

## 2022-09-28 DIAGNOSIS — K219 Gastro-esophageal reflux disease without esophagitis: Secondary | ICD-10-CM | POA: Diagnosis not present

## 2022-09-28 DIAGNOSIS — J449 Chronic obstructive pulmonary disease, unspecified: Secondary | ICD-10-CM | POA: Diagnosis not present

## 2022-09-28 DIAGNOSIS — L89322 Pressure ulcer of left buttock, stage 2: Secondary | ICD-10-CM | POA: Diagnosis not present

## 2022-09-28 DIAGNOSIS — I639 Cerebral infarction, unspecified: Secondary | ICD-10-CM | POA: Diagnosis not present

## 2022-09-28 DIAGNOSIS — I10 Essential (primary) hypertension: Secondary | ICD-10-CM | POA: Diagnosis not present

## 2022-09-28 DIAGNOSIS — M6281 Muscle weakness (generalized): Secondary | ICD-10-CM | POA: Diagnosis not present

## 2022-09-29 ENCOUNTER — Telehealth: Payer: Self-pay

## 2022-09-29 ENCOUNTER — Telehealth: Payer: Self-pay | Admitting: *Deleted

## 2022-09-29 NOTE — Telephone Encounter (Signed)
Attempted to call pt re: chest pain?LVM for pt to call back  Per pcp, would be glad to see pt if pt is having chest pains.

## 2022-09-29 NOTE — Progress Notes (Signed)
  Care Coordination Note  09/29/2022 Name: Zachary Chambers MRN: [id number] DOB: 1950/10/04  Zachary Chambers is a 72 y.o. year old male who is a primary care patient of Dennard Schaumann, Cammie Mcgee, MD and is actively engaged with the care management team. I reached out to Janeann Merl by phone today to assist with re-scheduling an initial visit with the RN Case Manager  Follow up plan: Unsuccessful telephone outreach attempt made. A HIPAA compliant phone message was left for the patient providing contact information and requesting a return call.   [place]  Direct Dial: [phone]

## 2022-09-29 NOTE — Telephone Encounter (Signed)
Error
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
## 2022-10-02 ENCOUNTER — Telehealth: Payer: Self-pay | Admitting: *Deleted

## 2022-10-02 NOTE — Progress Notes (Signed)
  Care Coordination Note  10/02/2022 Name: Zachary Chambers MRN: [id number] DOB: 1950/09/22  [name] is a 72 y.o. year old male who is a primary care patient of Dennard Schaumann, Cammie Mcgee, MD and is actively engaged with the care management team. I reached out to Janeann Merl by phone today to assist with re-scheduling an initial visit with the RN Case Manager  Follow up plan: Unsuccessful telephone outreach attempt made. A HIPAA compliant phone message was left for the patient providing contact information and requesting a return call.  We have been unable to make contact with the patient for follow up. The care management team is available to follow up with the patient after provider conversation with the patient regarding recommendation for care management engagement and subsequent re-referral to the care management team.   [place]  Direct Dial: [phone]

## 2022-10-13 ENCOUNTER — Telehealth: Payer: Self-pay | Admitting: *Deleted

## 2022-10-13 NOTE — Progress Notes (Signed)
  Care Coordination Note  10/13/2022 Name: Zachary Chambers MRN: [id number] DOB: 1951-04-07  Zachary Chambers is a 72 y.o. year old male who is a primary care patient of Dennard Schaumann, Cammie Mcgee, MD and is actively engaged with the care management team. I reached out to Janeann Merl by phone today to assist with re-scheduling an initial visit with the RN Case Manager  Follow up plan: Patient wants to speak with PCP before rescheduling with [place]  Direct Dial: [phone]

## 2022-10-15 ENCOUNTER — Encounter: Payer: Self-pay | Admitting: Family Medicine

## 2022-10-15 ENCOUNTER — Ambulatory Visit (INDEPENDENT_AMBULATORY_CARE_PROVIDER_SITE_OTHER): Payer: Medicare HMO | Admitting: Family Medicine

## 2022-10-15 VITALS — BP 116/62 | HR 86 | Temp 97.6°F | Ht 68.0 in | Wt 122.0 lb

## 2022-10-15 DIAGNOSIS — R051 Acute cough: Secondary | ICD-10-CM | POA: Diagnosis not present

## 2022-10-15 DIAGNOSIS — F419 Anxiety disorder, unspecified: Secondary | ICD-10-CM

## 2022-10-15 DIAGNOSIS — I2510 Atherosclerotic heart disease of native coronary artery without angina pectoris: Secondary | ICD-10-CM | POA: Diagnosis not present

## 2022-10-15 DIAGNOSIS — J449 Chronic obstructive pulmonary disease, unspecified: Secondary | ICD-10-CM

## 2022-10-15 DIAGNOSIS — E785 Hyperlipidemia, unspecified: Secondary | ICD-10-CM | POA: Diagnosis not present

## 2022-10-15 DIAGNOSIS — I4891 Unspecified atrial fibrillation: Secondary | ICD-10-CM

## 2022-10-15 DIAGNOSIS — F32A Depression, unspecified: Secondary | ICD-10-CM | POA: Diagnosis not present

## 2022-10-15 DIAGNOSIS — S51019A Laceration without foreign body of unspecified elbow, initial encounter: Secondary | ICD-10-CM | POA: Diagnosis not present

## 2022-10-15 MED ORDER — NITROGLYCERIN 0.4 MG SL SUBL: SUBLINGUAL_TABLET | SUBLINGUAL | 1 refills | Status: DC

## 2022-10-15 MED ORDER — MAGNESIUM OXIDE -MG SUPPLEMENT 400 (240 MG) MG PO TABS: 400.0000 mg | ORAL_TABLET | Freq: Every day | ORAL | 3 refills | Status: DC

## 2022-10-15 MED ORDER — BENZONATATE 100 MG PO CAPS: 100.0000 mg | ORAL_CAPSULE | Freq: Three times a day (TID) | ORAL | 11 refills | Status: DC | PRN

## 2022-10-15 MED ORDER — METOPROLOL TARTRATE 50 MG PO TABS: 50.0000 mg | ORAL_TABLET | Freq: Two times a day (BID) | ORAL | 1 refills | Status: DC

## 2022-10-15 MED ORDER — ISOSORBIDE MONONITRATE ER 30 MG PO TB24: 15.0000 mg | ORAL_TABLET | Freq: Every day | ORAL | 3 refills | Status: DC

## 2022-10-15 MED ORDER — ADULT MULTIVITAMIN W/MINERALS CH: 1.0000 | ORAL_TABLET | Freq: Every day | ORAL | 3 refills | Status: DC

## 2022-10-15 MED ORDER — ALBUTEROL SULFATE (2.5 MG/3ML) 0.083% IN NEBU: 2.5000 mg | INHALATION_SOLUTION | RESPIRATORY_TRACT | 4 refills | Status: DC | PRN

## 2022-10-15 MED ORDER — OMEPRAZOLE 40 MG PO CPDR: 40.0000 mg | DELAYED_RELEASE_CAPSULE | Freq: Every day | ORAL | 3 refills | Status: DC

## 2022-10-15 MED ORDER — PREDNISONE 10 MG PO TABS: 10.0000 mg | ORAL_TABLET | Freq: Every morning | ORAL | 3 refills | Status: DC

## 2022-10-15 MED ORDER — QUETIAPINE FUMARATE 25 MG PO TABS: 25.0000 mg | ORAL_TABLET | Freq: Every day | ORAL | 3 refills | Status: DC

## 2022-10-15 MED ORDER — CLOTRIMAZOLE-BETAMETHASONE 1-0.05 % EX CREA: TOPICAL_CREAM | CUTANEOUS | 1 refills | Status: DC

## 2022-10-15 MED ORDER — APIXABAN 5 MG PO TABS: 5.0000 mg | ORAL_TABLET | Freq: Two times a day (BID) | ORAL | 3 refills | Status: DC

## 2022-10-15 MED ORDER — FLUTICASONE PROPIONATE 50 MCG/ACT NA SUSP: 2.0000 | Freq: Every day | NASAL | 3 refills | Status: DC

## 2022-10-15 MED ORDER — ATORVASTATIN CALCIUM 40 MG PO TABS: 40.0000 mg | ORAL_TABLET | Freq: Every day | ORAL | 3 refills | Status: DC

## 2022-10-15 MED ORDER — BREZTRI AEROSPHERE 160-9-4.8 MCG/ACT IN AERO: 2.0000 | INHALATION_SPRAY | Freq: Two times a day (BID) | RESPIRATORY_TRACT | 3 refills | Status: DC
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
## 2022-10-16 NOTE — Progress Notes (Signed)
  Care Coordination   Note   10/16/2022 Name: Zachary Chambers MRN: [id number] DOB: November 08, 1950  Zachary Chambers is a 72 y.o. year old male who sees Pickard, Cammie Mcgee, MD for primary care. I reached out to Janeann Merl by phone today to offer care coordination services.  Zachary Chambers was given information about Care Coordination services today including:   The Care Coordination services include support from the care team which includes your Nurse Coordinator, Clinical Social Worker, or Pharmacist.  The Care Coordination team is here to help remove barriers to the health concerns and goals most important to you. Care Coordination services are voluntary, and the patient may decline or stop services at any time by request to their care team member.   Care Coordination Consent Status: Patient agreed to services and verbal consent obtained.   Follow up plan:  Telephone appointment with care coordination team member scheduled for:  10/26/22  Encounter Outcome:  Pt. Scheduled  [place]  Direct Dial: [phone]

## 2022-10-16 NOTE — Progress Notes (Signed)
  Care Coordination  Outreach Note  10/16/2022 Name: Zachary Chambers MRN: [id number] DOB: 1950/11/29   Care Coordination Outreach Attempts: A second unsuccessful outreach was attempted today to offer the patient with information about available care coordination services as a benefit of their health plan.     Follow Up Plan:  Additional outreach attempts will be made to offer the patient care coordination information and services.   Encounter Outcome:  No Answer  [place]  Direct Dial: [phone]

## 2022-10-23 ENCOUNTER — Other Ambulatory Visit: Payer: Self-pay

## 2022-10-23 DIAGNOSIS — J449 Chronic obstructive pulmonary disease, unspecified: Secondary | ICD-10-CM

## 2022-10-23 DIAGNOSIS — I4891 Unspecified atrial fibrillation: Secondary | ICD-10-CM

## 2022-10-23 NOTE — Addendum Note (Signed)
Addended by: Randal Buba K on: 10/23/2022 03:34 PM   Modules accepted: Orders

## 2022-10-26 ENCOUNTER — Ambulatory Visit: Payer: Self-pay | Admitting: *Deleted

## 2022-10-26 NOTE — Patient Outreach (Signed)
Care Coordination   Follow Up Visit Note   05/09/2023 late entry for 10/26/22 Name: Zachary Chambers MRN: [id number] DOB: 18-Aug-1950  Kylian T Voet is a 72 y.o. year old male who sees Pickard, Priscille Heidelberg, MD for primary care. I spoke with  Maggie Schwalbe by phone today.  What matters to the patients health and wellness today?  Need pull ups and pads size small-medium Lost weight 124 lbs  Wife is primary caregiver/support system. She reports he is "Not sleeping well"  She tries to sleep when patient is sleep Support system none Security system   Durable medical equipment bill shows discrepancies Changing medical provider $500 bill - but was taking patient money  [employer]- wife have to go pick up supplies Stopped delivery from [place] [employer] [phone] off wife gave portable interested in a new agency to deliver equipment vs picking supplies up Need portable toilet walker bed pads    Portable breathing machine   Goals Addressed             This Visit's Progress    home equipment needs RN CM       Interventions Today    Flowsheet Row Most Recent Value  Chronic Disease   Chronic disease during today's visit Other  [home equipment, continence supplies]  General Interventions   General Interventions Discussed/Reviewed General Interventions Discussed, Durable Medical Equipment (DME), Oceanographer (DME) --  [needing portable toilet, walker, bed pads]  Exercise Interventions   Exercise Discussed/Reviewed Exercise Discussed, Physical Activity  Physical Activity Discussed/Reviewed Physical Activity Discussed  Education Interventions   Education Provided Provided Education  Provided Verbal Education On Mental Health/Coping with Illness, Programmer, applications, Other  Mental Health Interventions   Mental Health Discussed/Reviewed Mental Health Discussed, Coping Strategies  Pharmacy Interventions   Pharmacy Dicussed/Reviewed Pharmacy Topics Discussed, Affording  Medications              SDOH assessments and interventions completed:  No     Care Coordination Interventions:  Yes, provided   Follow up plan: Follow up call scheduled for 05/14/23    Encounter Outcome:  Patient Visit Completed   Cala Bradford L. Noelle Penner, RN, BSN, CCM, Care Management Coordinator [phone]

## 2022-10-27 ENCOUNTER — Telehealth: Payer: Self-pay | Admitting: Family Medicine

## 2022-10-27 NOTE — Telephone Encounter (Signed)
Received call from patient's wife Lelon Frohlich to request a new mattress for the patient; requesting the most comfortable mattress (gel) with the lowest out of pocket cost. Patient's current mattress is sagging and doesn't give him good support. Patient doesn't sleep well at night.   Request should be sent to [facility]. Please advise Whitney at [phone].  Patient also needs solution for new nebulizer machine which he received yesterday; stated patient is using the machine every 4 hours and needs refills on file.   [name] requesting a follow up call with status of requests at [phone]

## 2022-10-29 ENCOUNTER — Other Ambulatory Visit: Payer: Self-pay | Admitting: Family Medicine

## 2022-10-29 DIAGNOSIS — J449 Chronic obstructive pulmonary disease, unspecified: Secondary | ICD-10-CM

## 2022-10-29 MED ORDER — ALBUTEROL SULFATE (2.5 MG/3ML) 0.083% IN NEBU: 2.5000 mg | INHALATION_SOLUTION | RESPIRATORY_TRACT | 4 refills | Status: DC | PRN

## 2022-11-02 NOTE — Telephone Encounter (Signed)
Received call from [place] with [facility] to report they don't provide gel mattresses.   Referral should be sent to a different facility. Please advise Terri with questions at [phone]

## 2022-11-16 DIAGNOSIS — J449 Chronic obstructive pulmonary disease, unspecified: Secondary | ICD-10-CM | POA: Diagnosis not present

## 2022-11-19 ENCOUNTER — Ambulatory Visit: Payer: Medicare Other | Admitting: Internal Medicine

## 2022-11-20 DIAGNOSIS — H2513 Age-related nuclear cataract, bilateral: Secondary | ICD-10-CM | POA: Diagnosis not present

## 2022-11-20 DIAGNOSIS — H40033 Anatomical narrow angle, bilateral: Secondary | ICD-10-CM | POA: Diagnosis not present

## 2022-11-26 DIAGNOSIS — J449 Chronic obstructive pulmonary disease, unspecified: Secondary | ICD-10-CM | POA: Diagnosis not present

## 2022-12-16 DIAGNOSIS — J449 Chronic obstructive pulmonary disease, unspecified: Secondary | ICD-10-CM | POA: Diagnosis not present

## 2022-12-26 DIAGNOSIS — J449 Chronic obstructive pulmonary disease, unspecified: Secondary | ICD-10-CM | POA: Diagnosis not present

## 2023-01-07 DIAGNOSIS — J449 Chronic obstructive pulmonary disease, unspecified: Secondary | ICD-10-CM | POA: Diagnosis not present

## 2023-01-16 DIAGNOSIS — J449 Chronic obstructive pulmonary disease, unspecified: Secondary | ICD-10-CM | POA: Diagnosis not present

## 2023-01-26 DIAGNOSIS — J449 Chronic obstructive pulmonary disease, unspecified: Secondary | ICD-10-CM | POA: Diagnosis not present

## 2023-02-03 DIAGNOSIS — J449 Chronic obstructive pulmonary disease, unspecified: Secondary | ICD-10-CM | POA: Diagnosis not present

## 2023-02-11 ENCOUNTER — Emergency Department (HOSPITAL_COMMUNITY): Payer: Medicare Other

## 2023-02-11 ENCOUNTER — Other Ambulatory Visit: Payer: Self-pay

## 2023-02-11 ENCOUNTER — Emergency Department (HOSPITAL_COMMUNITY)
Admission: EM | Admit: 2023-02-11 | Discharge: 2023-02-11 | Disposition: A | Discharge status: Home / Self Care | Payer: Medicare Other | Attending: Emergency Medicine | Admitting: Emergency Medicine

## 2023-02-11 ENCOUNTER — Ambulatory Visit: Payer: Medicare Other | Admitting: Internal Medicine

## 2023-02-11 DIAGNOSIS — S50812A Abrasion of left forearm, initial encounter: Secondary | ICD-10-CM | POA: Diagnosis not present

## 2023-02-11 DIAGNOSIS — Z7901 Long term (current) use of anticoagulants: Secondary | ICD-10-CM | POA: Diagnosis not present

## 2023-02-11 DIAGNOSIS — I868 Varicose veins of other specified sites: Secondary | ICD-10-CM | POA: Diagnosis not present

## 2023-02-11 DIAGNOSIS — M799 Soft tissue disorder, unspecified: Secondary | ICD-10-CM | POA: Diagnosis not present

## 2023-02-11 DIAGNOSIS — S5012XA Contusion of left forearm, initial encounter: Secondary | ICD-10-CM | POA: Diagnosis not present

## 2023-02-11 DIAGNOSIS — J449 Chronic obstructive pulmonary disease, unspecified: Secondary | ICD-10-CM | POA: Insufficient documentation

## 2023-02-11 DIAGNOSIS — Z9981 Dependence on supplemental oxygen: Secondary | ICD-10-CM | POA: Insufficient documentation

## 2023-02-11 DIAGNOSIS — T148XXA Other injury of unspecified body region, initial encounter: Secondary | ICD-10-CM

## 2023-02-11 DIAGNOSIS — W01190A Fall on same level from slipping, tripping and stumbling with subsequent striking against furniture, initial encounter: Secondary | ICD-10-CM | POA: Insufficient documentation

## 2023-02-11 DIAGNOSIS — R413 Other amnesia: Secondary | ICD-10-CM | POA: Diagnosis not present

## 2023-02-11 DIAGNOSIS — R6889 Other general symptoms and signs: Secondary | ICD-10-CM | POA: Diagnosis not present

## 2023-02-11 DIAGNOSIS — R238 Other skin changes: Secondary | ICD-10-CM | POA: Diagnosis not present

## 2023-02-11 DIAGNOSIS — M19032 Primary osteoarthritis, left wrist: Secondary | ICD-10-CM | POA: Diagnosis not present

## 2023-02-11 DIAGNOSIS — M7989 Other specified soft tissue disorders: Secondary | ICD-10-CM | POA: Diagnosis not present

## 2023-02-11 DIAGNOSIS — S81802S Unspecified open wound, left lower leg, sequela: Secondary | ICD-10-CM | POA: Diagnosis not present

## 2023-02-11 DIAGNOSIS — S5010XA Contusion of unspecified forearm, initial encounter: Secondary | ICD-10-CM

## 2023-02-11 DIAGNOSIS — I10 Essential (primary) hypertension: Secondary | ICD-10-CM | POA: Diagnosis not present

## 2023-02-11 MED ORDER — ACETAMINOPHEN 325 MG PO TABS
650.0000 mg | ORAL_TABLET | Freq: Once | ORAL | Status: AC
  Administered 2023-02-11: 650 mg via ORAL
  Filled 2023-02-11: qty 2

## 2023-02-11 NOTE — ED Triage Notes (Signed)
Pt arrives to Ed c/o mechanical fall over speaker chords to floor. Pt's left forearm struck object while falling. Large hematoma noted to extremity. Pt denies head strike and thinners. Pt wears 2-5L at all times for COPD.

## 2023-02-11 NOTE — ED Provider Notes (Signed)
[place] EMERGENCY DEPARTMENT AT [facility] Provider Note   CSN: [id number] Arrival date & time: 02/11/23  2105     History  Chief Complaint  Patient presents with   Zachary Chambers is a 72 y.o. male.   Fall     Patient presents to the ED for evaluation of an arm injury.  Patient states he tripped over some cords on the floor.  He struck his left arm on a piece of furniture.  Patient sustained a superficial abrasion and hematoma to his forearm.  He is not on any blood thinners.  He does have COPD and is chronically on oxygen.  States he has thin skin  Home Medications Prior to Admission medications   Medication Sig Start Date End Date Taking? Authorizing Provider  acetaminophen (TYLENOL) 325 MG tablet Take 2 tablets (650 mg total) by mouth every 6 (six) hours as needed for mild pain (or Fever >/= 101). 07/31/22   Arrien, York Ram, MD  albuterol (PROVENTIL) (2.5 MG/3ML) 0.083% nebulizer solution Take 3 mLs (2.5 mg total) by nebulization every 4 (four) hours as needed for wheezing or shortness of breath. 10/29/22   Donita Brooks, MD  apixaban (ELIQUIS) 5 MG TABS tablet Take 1 tablet (5 mg total) by mouth 2 (two) times daily. 10/15/22   Donita Brooks, MD  atorvastatin (LIPITOR) 40 MG tablet Take 1 tablet (40 mg total) by mouth at bedtime. 10/15/22   Donita Brooks, MD  benzonatate (TESSALON PERLES) 100 MG capsule Take 1 capsule (100 mg total) by mouth 3 (three) times daily as needed for cough. 10/15/22   Donita Brooks, MD  Blood Glucose Calibration (TRUE METRIX LEVEL 1) Low SOLN USE AS DIRECTED. To monitor fsbs 2 times per day dx e11.9 06/02/21   Donita Brooks, MD  Blood Glucose Monitoring Suppl (TRUE METRIX AIR GLUCOSE METER) DEVI USE AS DIRECTED. To monitor fsbs 2 times per day dx e11.9 06/02/21   Donita Brooks, MD  Budeson-Glycopyrrol-Formoterol (BREZTRI AEROSPHERE) 160-9-4.8 MCG/ACT AERO Inhale 2 puffs into the lungs 2 (two) times daily.  10/15/22   Donita Brooks, MD  Cholecalciferol (D3 PO) Take 1 tablet by mouth daily.    [provider]  clotrimazole-betamethasone (LOTRISONE) cream APPLY TO SKIN 2 TIMES DAILY 10/15/22   Donita Brooks, MD  Cyanocobalamin (VITAMIN B-12 PO) Take 1 tablet by mouth daily.    [provider]  fluticasone (FLONASE) 50 MCG/ACT nasal spray Place 2 sprays into both nostrils daily. 10/15/22   Donita Brooks, MD  glucose blood (RELION TRUE METRIX TEST STRIPS) test strip USE AS DIRECTED. To monitor fsbs 2 times per day dx e11.9 06/02/21   Donita Brooks, MD  isosorbide mononitrate (IMDUR) 30 MG 24 hr tablet Take 0.5 tablets (15 mg total) by mouth daily. 10/15/22   Donita Brooks, MD  magnesium oxide (MAG-OX) 400 (240 Mg) MG tablet Take 1 tablet (400 mg total) by mouth daily. 10/15/22   Donita Brooks, MD  metoprolol tartrate (LOPRESSOR) 50 MG tablet Take 1 tablet (50 mg total) by mouth 2 (two) times daily. 10/15/22   Donita Brooks, MD  Multiple Vitamin (MULTIVITAMIN WITH MINERALS) TABS tablet Take 1 tablet by mouth daily. 10/15/22   Donita Brooks, MD  nitroGLYCERIN (NITROSTAT) 0.4 MG SL tablet DISSOLVE 1 TABLET UNDER THE TONGUE EVERY 5 MINUTES AS NEEDED FOR CHEST PAIN. 10/15/22   Donita Brooks, MD  omeprazole (PRILOSEC)  40 MG capsule Take 1 capsule (40 mg total) by mouth daily. 10/15/22   Donita Brooks, MD  predniSONE (DELTASONE) 10 MG tablet Take 1 tablet (10 mg total) by mouth every morning. 10/15/22   Donita Brooks, MD  QUEtiapine (SEROQUEL) 25 MG tablet Take 1 tablet (25 mg total) by mouth at bedtime. 10/15/22   Donita Brooks, MD      Allergies    Patient has no known allergies.    Review of Systems   Review of Systems  Physical Exam Updated Vital Signs BP 137/60   Pulse 79   Temp 99.5 F (37.5 C) (Oral)   Resp 18   Ht 1.727 m (5\' 8" )   Wt 65.8 kg   SpO2 94%   BMI 22.05 kg/m  Physical Exam Vitals and nursing note reviewed.   Constitutional:      General: He is not in acute distress.    Appearance: He is well-developed.     Comments: Chronically ill-appearing  HENT:     Head: Normocephalic and atraumatic.     Right Ear: External ear normal.     Left Ear: External ear normal.  Eyes:     General: No scleral icterus.       Right eye: No discharge.        Left eye: No discharge.     Conjunctiva/sclera: Conjunctivae normal.  Neck:     Trachea: No tracheal deviation.  Cardiovascular:     Rate and Rhythm: Normal rate.  Pulmonary:     Effort: Pulmonary effort is normal. No respiratory distress.     Breath sounds: No stridor.  Abdominal:     General: There is no distension.  Musculoskeletal:        General: Swelling, tenderness and deformity present.     Cervical back: Neck supple.     Comments: Swelling and hematoma noted left forearm, no tenderness palpation wrist or shoulder, no tenderness in the other extremities  Skin:    General: Skin is warm and dry.     Findings: No rash.  Neurological:     Mental Status: He is alert. Mental status is at baseline.     Cranial Nerves: No dysarthria or facial asymmetry.     Motor: No seizure activity.     ED Results / Procedures / Treatments   Labs (all labs ordered are listed, but only abnormal results are displayed) Labs Reviewed - No data to display  EKG None  Radiology DG Forearm Left  Result Date: 02/11/2023 CLINICAL DATA:  Fall. EXAM: LEFT FOREARM - 2 VIEW COMPARISON:  Left forearm x-rays dated July 27, 2022. FINDINGS: Asymmetric dorsal and ulnar soft tissue swelling of the mid forearm. No acute fracture. Degenerative changes of the wrist. IMPRESSION: 1. Soft tissue swelling without acute osseous abnormality. Electronically Signed   By: Obie Dredge M.D.   On: 02/11/2023 21:47    Procedures Procedures    Medications Ordered in ED Medications  acetaminophen (TYLENOL) tablet 650 mg (650 mg Oral Given 02/11/23 2220)    ED Course/ Medical  Decision Making/ A&P                             Medical Decision Making Risk OTC drugs.   His x-rays do not show any evidence of fracture.  He does have evidence of a hematoma.  Reassured patient and family that this does not directly lead to a deep venous thrombosis.  Will go ahead and provide local wound care ice and an Ace wrap.        Final Clinical Impression(s) / ED Diagnoses Final diagnoses:  Hematoma of forearm  Skin abrasion    Rx / DC Orders ED Discharge Orders     None         Linwood Dibbles, MD 02/11/23 2232

## 2023-02-11 NOTE — Discharge Instructions (Signed)
Apply ice to help with the swelling.  You can also apply an Ace wrap as tolerated to help with the swelling.  It should slowly resolve over the next several weeks.

## 2023-02-15 ENCOUNTER — Telehealth: Payer: Self-pay

## 2023-02-15 ENCOUNTER — Ambulatory Visit: Payer: Self-pay

## 2023-02-15 ENCOUNTER — Telehealth: Payer: Self-pay | Admitting: Family Medicine

## 2023-02-15 DIAGNOSIS — J449 Chronic obstructive pulmonary disease, unspecified: Secondary | ICD-10-CM | POA: Diagnosis not present

## 2023-02-15 NOTE — Telephone Encounter (Signed)
Received call from patient's spouse to request samples of the following meds:  apixaban (ELIQUIS) 5 MG TABS tablet [[id number]]   Budeson-Glycopyrrol-Formoterol (BREZTRI AEROSPHERE) 160-9-4.8 MCG/ACT AERO [[id number]]   Meds dispensed as follows:  4 packs of Eliquis 1 aerosol pump of Breztri  Samples will be picked up either by the close of business today or tomorrow at patient's appointment.

## 2023-02-15 NOTE — Transitions of Care (Post Inpatient/ED Visit) (Unsigned)
   02/15/2023  Name: Zachary Chambers MRN: [id number] DOB: Aug 16, 1951  Today's TOC FU Call Status: Today's TOC FU Call Status:: Unsuccessul Call (1st Attempt)  Attempted to reach the patient regarding the most recent Inpatient/ED visit.  Follow Up Plan: Additional outreach attempts will be made to reach the patient to complete the Transitions of Care (Post Inpatient/ED visit) call.   Signature   Woodfin Ganja LPN [facility] Nurse Health Advisor Direct Dial [phone]

## 2023-02-15 NOTE — Chronic Care Management (AMB) (Signed)
   02/15/2023  Zachary Chambers [birth year] [id number]   Reason for Encounter: Patient is currently not enrolled in the CCM program. CCM enrollment status changed to "Previously enrolled"   [facility]/Chronic Care Management [phone]

## 2023-02-16 ENCOUNTER — Ambulatory Visit: Payer: Medicare Other | Admitting: Family Medicine

## 2023-02-16 NOTE — Transitions of Care (Post Inpatient/ED Visit) (Signed)
   02/16/2023  Name: Zachary Chambers MRN: [id number] DOB: Oct 28, 1950  Today's TOC FU Call Status: Today's TOC FU Call Status:: Unsuccessful Call (2nd Attempt) Unsuccessful Call (2nd Attempt) Date: 02/16/23  Attempted to reach the patient regarding the most recent Inpatient/ED visit.  Follow Up Plan: No further outreach attempts will be made at this time. We have been unable to contact the patient. Pt has fu appt today 02-16-23 at 330 pm  Signature  Woodfin Ganja LPN [facility] Nurse Health Advisor Direct Dial [phone]

## 2023-02-19 ENCOUNTER — Other Ambulatory Visit: Payer: Self-pay | Admitting: Family Medicine

## 2023-02-19 DIAGNOSIS — F32A Depression, unspecified: Secondary | ICD-10-CM

## 2023-02-19 DIAGNOSIS — F419 Anxiety disorder, unspecified: Secondary | ICD-10-CM

## 2023-02-19 DIAGNOSIS — J449 Chronic obstructive pulmonary disease, unspecified: Secondary | ICD-10-CM

## 2023-02-19 DIAGNOSIS — E785 Hyperlipidemia, unspecified: Secondary | ICD-10-CM

## 2023-02-19 DIAGNOSIS — I4891 Unspecified atrial fibrillation: Secondary | ICD-10-CM

## 2023-02-19 DIAGNOSIS — I2510 Atherosclerotic heart disease of native coronary artery without angina pectoris: Secondary | ICD-10-CM

## 2023-02-19 NOTE — Telephone Encounter (Signed)
Spouse requesting a refill on his apixaban (ELIQUIS) 5 MG TABS tablet   predniSONE (DELTASONE) 10 MG tablet  atorvastatin (LIPITOR) 40 MG tablet  QUEtiapine (SEROQUEL) 25 MG tablet  Budeson-Glycopyrrol-Formoterol (BREZTRI AEROSPHERE) 160-9-4.8 MCG/ACT AERO  isosorbide mononitrate (IMDUR) 30 MG 24 hr tablet  metoprolol tartrate (LOPRESSOR) 50 MG tablet  She is also requesting silvadene cream 1% for the sores that he gets due to having paper thin skin.  She is also requesting something for cough.  [facility]  Please let patient know when this has been called in.

## 2023-02-19 NOTE — Telephone Encounter (Signed)
Patient also requesting silvadene cream 1% and medication for cough, not on current medication list. Other requests routed to nurse pool for refill.

## 2023-02-24 ENCOUNTER — Emergency Department (HOSPITAL_COMMUNITY)
Admission: EM | Admit: 2023-02-24 | Discharge: 2023-02-24 | Disposition: A | Discharge status: Home / Self Care | Payer: Medicare Other | Attending: Emergency Medicine | Admitting: Emergency Medicine

## 2023-02-24 ENCOUNTER — Emergency Department (HOSPITAL_COMMUNITY): Payer: Medicare Other

## 2023-02-24 ENCOUNTER — Other Ambulatory Visit: Payer: Self-pay

## 2023-02-24 DIAGNOSIS — S81802D Unspecified open wound, left lower leg, subsequent encounter: Secondary | ICD-10-CM | POA: Insufficient documentation

## 2023-02-24 DIAGNOSIS — S81801D Unspecified open wound, right lower leg, subsequent encounter: Secondary | ICD-10-CM | POA: Insufficient documentation

## 2023-02-24 DIAGNOSIS — S8001XD Contusion of right knee, subsequent encounter: Secondary | ICD-10-CM | POA: Diagnosis not present

## 2023-02-24 DIAGNOSIS — J449 Chronic obstructive pulmonary disease, unspecified: Secondary | ICD-10-CM | POA: Diagnosis not present

## 2023-02-24 DIAGNOSIS — S5011XD Contusion of right forearm, subsequent encounter: Secondary | ICD-10-CM | POA: Diagnosis not present

## 2023-02-24 DIAGNOSIS — M25461 Effusion, right knee: Secondary | ICD-10-CM | POA: Diagnosis not present

## 2023-02-24 DIAGNOSIS — R58 Hemorrhage, not elsewhere classified: Secondary | ICD-10-CM | POA: Diagnosis not present

## 2023-02-24 DIAGNOSIS — W19XXXD Unspecified fall, subsequent encounter: Secondary | ICD-10-CM | POA: Diagnosis not present

## 2023-02-24 DIAGNOSIS — Z5189 Encounter for other specified aftercare: Secondary | ICD-10-CM

## 2023-02-24 DIAGNOSIS — Z4800 Encounter for change or removal of nonsurgical wound dressing: Secondary | ICD-10-CM | POA: Diagnosis not present

## 2023-02-24 DIAGNOSIS — S81001A Unspecified open wound, right knee, initial encounter: Secondary | ICD-10-CM | POA: Diagnosis not present

## 2023-02-24 DIAGNOSIS — S8991XD Unspecified injury of right lower leg, subsequent encounter: Secondary | ICD-10-CM | POA: Diagnosis present

## 2023-02-24 DIAGNOSIS — M7989 Other specified soft tissue disorders: Secondary | ICD-10-CM | POA: Diagnosis not present

## 2023-02-24 DIAGNOSIS — S8002XA Contusion of left knee, initial encounter: Secondary | ICD-10-CM | POA: Diagnosis not present

## 2023-02-24 DIAGNOSIS — I2510 Atherosclerotic heart disease of native coronary artery without angina pectoris: Secondary | ICD-10-CM | POA: Diagnosis not present

## 2023-02-24 DIAGNOSIS — Z7901 Long term (current) use of anticoagulants: Secondary | ICD-10-CM | POA: Diagnosis not present

## 2023-02-24 DIAGNOSIS — S8001XA Contusion of right knee, initial encounter: Secondary | ICD-10-CM

## 2023-02-24 DIAGNOSIS — M1711 Unilateral primary osteoarthritis, right knee: Secondary | ICD-10-CM | POA: Diagnosis not present

## 2023-02-24 DIAGNOSIS — R0902 Hypoxemia: Secondary | ICD-10-CM | POA: Diagnosis not present

## 2023-02-24 DIAGNOSIS — R609 Edema, unspecified: Secondary | ICD-10-CM | POA: Diagnosis not present

## 2023-02-24 DIAGNOSIS — S8991XA Unspecified injury of right lower leg, initial encounter: Secondary | ICD-10-CM | POA: Diagnosis not present

## 2023-02-24 DIAGNOSIS — Z7951 Long term (current) use of inhaled steroids: Secondary | ICD-10-CM | POA: Diagnosis not present

## 2023-02-24 DIAGNOSIS — R059 Cough, unspecified: Secondary | ICD-10-CM | POA: Diagnosis not present

## 2023-02-24 DIAGNOSIS — I7090 Unspecified atherosclerosis: Secondary | ICD-10-CM | POA: Diagnosis not present

## 2023-02-24 DIAGNOSIS — Z4801 Encounter for change or removal of surgical wound dressing: Secondary | ICD-10-CM | POA: Diagnosis not present

## 2023-02-24 DIAGNOSIS — W19XXXA Unspecified fall, initial encounter: Secondary | ICD-10-CM | POA: Diagnosis not present

## 2023-02-24 DIAGNOSIS — Z743 Need for continuous supervision: Secondary | ICD-10-CM | POA: Diagnosis not present

## 2023-02-24 LAB — COMPREHENSIVE METABOLIC PANEL
ALT: 17 U/L (ref 0–44)
AST: 34 U/L (ref 15–41)
Albumin: 3.5 g/dL (ref 3.5–5.0)
Alkaline Phosphatase: 67 U/L (ref 38–126)
Anion gap: 10 (ref 5–15)
BUN: 17 mg/dL (ref 8–23)
CO2: 31 mmol/L (ref 22–32)
Calcium: 9.2 mg/dL (ref 8.9–10.3)
Chloride: 99 mmol/L (ref 98–111)
Creatinine, Ser: 1.18 mg/dL (ref 0.61–1.24)
GFR, Estimated: >60 mL/min (ref >60)
Glucose, Bld: 108 mg/dL — ABNORMAL HIGH (ref 70–99)
Potassium: 4.6 mmol/L (ref 3.5–5.1)
Sodium: 140 mmol/L (ref 135–145)
Total Bilirubin: 1.2 mg/dL (ref 0.3–1.2)
Total Protein: 6.3 g/dL — ABNORMAL LOW (ref 6.5–8.1)

## 2023-02-24 LAB — CBC WITH DIFFERENTIAL/PLATELET
Abs Immature Granulocytes: 0 10*3/uL (ref 0.00–0.07)
Basophils Absolute: 0 10*3/uL (ref 0.0–0.1)
Basophils Relative: 0 %
Eosinophils Absolute: 0 10*3/uL (ref 0.0–0.5)
Eosinophils Relative: 0 %
HCT: 45.2 % (ref 39.0–52.0)
Hemoglobin: 13.6 g/dL (ref 13.0–17.0)
Lymphocytes Relative: 6 %
Lymphs Abs: 0.7 10*3/uL (ref 0.7–4.0)
MCH: 26 pg (ref 26.0–34.0)
MCHC: 30.1 g/dL (ref 30.0–36.0)
MCV: 86.4 fL (ref 80.0–100.0)
Monocytes Absolute: 0.6 10*3/uL (ref 0.1–1.0)
Monocytes Relative: 5 %
Neutro Abs: 10.9 10*3/uL — ABNORMAL HIGH (ref 1.7–7.7)
Neutrophils Relative %: 89 %
Platelets: 399 10*3/uL (ref 150–400)
RBC: 5.23 MIL/uL (ref 4.22–5.81)
RDW: 19.4 % — ABNORMAL HIGH (ref 11.5–15.5)
WBC: 12.3 10*3/uL — ABNORMAL HIGH (ref 4.0–10.5)
nRBC: 0 % (ref 0.0–0.2)
nRBC: 1 /100 WBC — ABNORMAL HIGH

## 2023-02-24 MED ORDER — ALBUTEROL SULFATE HFA 108 (90 BASE) MCG/ACT IN AERS
2.0000 | INHALATION_SPRAY | Freq: Once | RESPIRATORY_TRACT | Status: AC
  Administered 2023-02-24: 2 via RESPIRATORY_TRACT

## 2023-02-24 MED ORDER — ALBUTEROL SULFATE (2.5 MG/3ML) 0.083% IN NEBU
2.5000 mg | INHALATION_SOLUTION | Freq: Once | RESPIRATORY_TRACT | Status: DC
  Filled 2023-02-24: qty 3

## 2023-02-24 MED ORDER — ALBUTEROL SULFATE HFA 108 (90 BASE) MCG/ACT IN AERS
INHALATION_SPRAY | RESPIRATORY_TRACT | Status: AC
  Administered 2023-02-24: 2 via RESPIRATORY_TRACT
  Filled 2023-02-24: qty 6.7

## 2023-02-24 MED ORDER — BACITRACIN ZINC 500 UNIT/GM EX OINT: 1.0000 | TOPICAL_OINTMENT | Freq: Two times a day (BID) | CUTANEOUS | 0 refills | Status: DC

## 2023-02-24 MED ORDER — CEPHALEXIN 500 MG PO CAPS: 500.0000 mg | ORAL_CAPSULE | Freq: Four times a day (QID) | ORAL | 0 refills | Status: AC

## 2023-02-24 NOTE — ED Notes (Signed)
Pt care taken, iv started and blood drawn.

## 2023-02-24 NOTE — ED Triage Notes (Signed)
PT BIB by [facility] with a c/o of a skin tear on his left arm that has gotten worse.

## 2023-02-24 NOTE — Discharge Instructions (Signed)
Keflex as prescribed.  Recheck with your doctor in 2 days.

## 2023-02-24 NOTE — ED Provider Notes (Signed)
[place] EMERGENCY DEPARTMENT AT [facility] Provider Note   CSN: [id number] Arrival date & time: 02/24/23  0049     History  Chief Complaint  Patient presents with   Wound Check    Zachary Chambers is a 72 y.o. male.  72 year old male brought in by EMS from home with concern for fall.  Patient was seen here on June 27 after a fall resulting in a wound to the left forearm.  He states that tonight his wound "let loose."  Patient notes the wound was bleeding after his wife helped him stand up tonight, his wife did not have any materials to bandage the wound and called EMS. States he did not hit his head.  Past medical history of peptic ulcer disease, GERD, hyperlipidemia, COPD on O2, paroxysmal A-fib, chronic respiratory failure, CVA, CAD, alcohol abuse, ACS.       Home Medications Prior to Admission medications   Medication Sig Start Date End Date Taking? Authorizing Provider  bacitracin ointment Apply 1 Application topically 2 (two) times daily. 02/24/23  Yes Jeannie Fend, PA-C  cephALEXin (KEFLEX) 500 MG capsule Take 1 capsule (500 mg total) by mouth 4 (four) times daily for 7 days. 02/24/23 03/03/23 Yes Jeannie Fend, PA-C  acetaminophen (TYLENOL) 325 MG tablet Take 2 tablets (650 mg total) by mouth every 6 (six) hours as needed for mild pain (or Fever >/= 101). 07/31/22   Arrien, York Ram, MD  albuterol (PROVENTIL) (2.5 MG/3ML) 0.083% nebulizer solution Take 3 mLs (2.5 mg total) by nebulization every 4 (four) hours as needed for wheezing or shortness of breath. 10/29/22   Donita Brooks, MD  apixaban (ELIQUIS) 5 MG TABS tablet Take 1 tablet (5 mg total) by mouth 2 (two) times daily. 10/15/22   Donita Brooks, MD  atorvastatin (LIPITOR) 40 MG tablet Take 1 tablet (40 mg total) by mouth at bedtime. 10/15/22   Donita Brooks, MD  benzonatate (TESSALON PERLES) 100 MG capsule Take 1 capsule (100 mg total) by mouth 3 (three) times daily as needed for cough. 10/15/22    Donita Brooks, MD  Blood Glucose Calibration (TRUE METRIX LEVEL 1) Low SOLN USE AS DIRECTED. To monitor fsbs 2 times per day dx e11.9 06/02/21   Donita Brooks, MD  Blood Glucose Monitoring Suppl (TRUE METRIX AIR GLUCOSE METER) DEVI USE AS DIRECTED. To monitor fsbs 2 times per day dx e11.9 06/02/21   Donita Brooks, MD  Budeson-Glycopyrrol-Formoterol (BREZTRI AEROSPHERE) 160-9-4.8 MCG/ACT AERO Inhale 2 puffs into the lungs 2 (two) times daily. 10/15/22   Donita Brooks, MD  Cholecalciferol (D3 PO) Take 1 tablet by mouth daily.    [provider]  clotrimazole-betamethasone (LOTRISONE) cream APPLY TO SKIN 2 TIMES DAILY 10/15/22   Donita Brooks, MD  Cyanocobalamin (VITAMIN B-12 PO) Take 1 tablet by mouth daily.    [provider]  fluticasone (FLONASE) 50 MCG/ACT nasal spray Place 2 sprays into both nostrils daily. 10/15/22   Donita Brooks, MD  glucose blood (RELION TRUE METRIX TEST STRIPS) test strip USE AS DIRECTED. To monitor fsbs 2 times per day dx e11.9 06/02/21   Donita Brooks, MD  isosorbide mononitrate (IMDUR) 30 MG 24 hr tablet Take 0.5 tablets (15 mg total) by mouth daily. 10/15/22   Donita Brooks, MD  magnesium oxide (MAG-OX) 400 (240 Mg) MG tablet Take 1 tablet (400 mg total) by mouth daily. 10/15/22   Donita Brooks, MD  metoprolol tartrate (LOPRESSOR) 50 MG tablet Take 1 tablet (50 mg total) by mouth 2 (two) times daily. 10/15/22   Donita Brooks, MD  Multiple Vitamin (MULTIVITAMIN WITH MINERALS) TABS tablet Take 1 tablet by mouth daily. 10/15/22   Donita Brooks, MD  nitroGLYCERIN (NITROSTAT) 0.4 MG SL tablet DISSOLVE 1 TABLET UNDER THE TONGUE EVERY 5 MINUTES AS NEEDED FOR CHEST PAIN. 10/15/22   Donita Brooks, MD  omeprazole (PRILOSEC) 40 MG capsule Take 1 capsule (40 mg total) by mouth daily. 10/15/22   Donita Brooks, MD  predniSONE (DELTASONE) 10 MG tablet Take 1 tablet (10 mg total) by mouth every morning. 10/15/22   Donita Brooks, MD  QUEtiapine (SEROQUEL) 25 MG tablet Take 1 tablet (25 mg total) by mouth at bedtime. 10/15/22   Donita Brooks, MD      Allergies    Patient has no known allergies.    Review of Systems   Review of Systems Negative except as per HPI  Physical Exam Updated Vital Signs BP 126/73 (BP Location: Right Arm)   Pulse (!) 56   Temp 98.1 F (36.7 C) (Oral)   Resp 16   Ht 5\' 8"  (1.727 m)   Wt 59 kg   SpO2 100%   BMI 19.78 kg/m  Physical Exam Vitals and nursing note reviewed.  Constitutional:      General: He is not in acute distress.    Appearance: He is well-developed. He is not diaphoretic.  HENT:     Head: Normocephalic and atraumatic.  Cardiovascular:     Rate and Rhythm: Normal rate and regular rhythm.     Heart sounds: Normal heart sounds.  Pulmonary:     Effort: Pulmonary effort is normal.     Breath sounds: Wheezing present.  Abdominal:     Palpations: Abdomen is soft.     Tenderness: There is no abdominal tenderness.  Musculoskeletal:        General: Swelling, tenderness and signs of injury present. No deformity. Normal range of motion.     Cervical back: Neck supple.     Comments: Hematoma to left forearm, no active bleeding, normal ROM  Ecchymosis to right knee, medial proximal tibia area with boggy appearance, possibly hematoma.   Minor wounds to bilateral lower legs.   Skin:    General: Skin is warm and dry.     Findings: Bruising present. No rash.  Neurological:     Mental Status: He is alert and oriented to person, place, and time.  Psychiatric:        Behavior: Behavior normal.        ED Results / Procedures / Treatments   Labs (all labs ordered are listed, but only abnormal results are displayed) Labs Reviewed  CBC WITH DIFFERENTIAL/PLATELET - Abnormal; Notable for the following components:      Result Value   WBC 12.3 (*)    RDW 19.4 (*)    Neutro Abs 10.9 (*)    nRBC 1 (*)    All other components within normal limits   COMPREHENSIVE METABOLIC PANEL - Abnormal; Notable for the following components:   Glucose, Bld 108 (*)    Total Protein 6.3 (*)    All other components within normal limits    EKG None  Radiology CT Knee Right Wo Contrast  Result Date: 02/24/2023 CLINICAL DATA:  Right knee trauma with occult fracture suspected. X-rays negative for definitive fracture. EXAM: CT OF THE RIGHT KNEE WITHOUT CONTRAST TECHNIQUE:  Multidetector CT imaging of the right knee was performed according to the standard protocol. Multiplanar CT image reconstructions were also generated. RADIATION DOSE REDUCTION: This exam was performed according to the departmental dose-optimization program which includes automated exposure control, adjustment of the mA and/or kV according to patient size and/or use of iterative reconstruction technique. COMPARISON:  Right knee series today, right knee series 07/27/2022 and 10/20/2017. No prior cross-sectional imaging. FINDINGS: Bones/Joint/Cartilage There is a chronic smooth concave depression in the surface of the posterior aspect of the lateral tibial plateau consistent with a remote injury and present on both prior sets of x-rays. There is no evidence of acute fracture. The bone mineralization is normal. There is femorotibial degenerative arthrosis with partial joint space loss and moderately prominent marginal osteophytes. There is slight narrowing and spurring of the patellofemoral joint. There are scattered degenerative subcortical cystic changes along the femorotibial joint. There is a small suprapatellar bursal effusion. The menisci are not well evaluated with CT, and either or both could harbor degenerative tears. Spurring changes are noted along the femoral intercondylar notch and tibial spines as well. No suspicious or focal bone lesion is seen. There is mild narrowing and trace spurring of the proximal tibiofibular joint. There is a normal variant fabella. Ligaments Suboptimally assessed  by CT. Muscles and Tendons No acute abnormality is seen without contrast. Soft tissues There is circumferential soft tissue edema. No space-occupying hematoma. There are moderate arterial vascular calcifications. No deep soft tissue gas or collections. IMPRESSION: 1. No acute fractures are seen. 2. Chronic smooth concave depression in the surface of the posterior aspect of the lateral tibial plateau consistent with a remote injury. 3. Tricompartment degenerative arthrosis with small suprapatellar bursal effusion. 4. The menisci are not well evaluated with CT, and either or both could harbor degenerative tears. 5. Circumferential soft tissue edema.  No focal hematoma. 6. Moderate arterial vascular calcifications. Electronically Signed   By: Almira Bar M.D.   On: 02/24/2023 06:02   DG Chest Port 1 View  Result Date: 02/24/2023 CLINICAL DATA:  Coughing.  Right knee wound. EXAM: RIGHT KNEE - COMPLETE 4+ VIEW; PORTABLE CHEST - 1 VIEW COMPARISON:  Portable chest and right knee series both 07/27/2022. FINDINGS: Chest AP portable, 3:04 a.m.: The lungs are emphysematous with chronic interstitial changes of the bases. No focal pneumonia is evident. There is no substantial pleural effusion. The cardiomediastinal silhouette and vascular pattern are normal. There is calcification in the aortic arch. Previous coronary stenting on the left. Osteopenia and degenerative change thoracic spine with chronic right rotator cuff arthropathy. Right knee, four views: There is soft tissue fullness anteriorly, not seen previously, extending over the patella and patellar tendon. There is a small suprapatellar bursal effusion. There are heavy vascular calcifications. Bone mineralization is normal. There is no evidence of fractures. There is femorotibial arthrosis greatest along the lateral compartment, with small tricompartmental marginal osteophytes. There is no evidence of erosive arthropathy or aggressive bone lesion. There is  artifact from overlying sheets. IMPRESSION: 1. No evidence of acute chest disease. Emphysematous and chronic change. 2. Soft tissue fullness anteriorly extending over the patella and patellar tendon, not seen previously. Small suprapatellar bursal effusion. 3. Right knee osteoarthritis greatest along the lateral compartment. No evidence of fractures. 4. Aortic and peripheral arterial atherosclerosis. Electronically Signed   By: Almira Bar M.D.   On: 02/24/2023 03:30   DG Knee Complete 4 Views Right  Result Date: 02/24/2023 CLINICAL DATA:  Coughing.  Right knee wound. EXAM:  RIGHT KNEE - COMPLETE 4+ VIEW; PORTABLE CHEST - 1 VIEW COMPARISON:  Portable chest and right knee series both 07/27/2022. FINDINGS: Chest AP portable, 3:04 a.m.: The lungs are emphysematous with chronic interstitial changes of the bases. No focal pneumonia is evident. There is no substantial pleural effusion. The cardiomediastinal silhouette and vascular pattern are normal. There is calcification in the aortic arch. Previous coronary stenting on the left. Osteopenia and degenerative change thoracic spine with chronic right rotator cuff arthropathy. Right knee, four views: There is soft tissue fullness anteriorly, not seen previously, extending over the patella and patellar tendon. There is a small suprapatellar bursal effusion. There are heavy vascular calcifications. Bone mineralization is normal. There is no evidence of fractures. There is femorotibial arthrosis greatest along the lateral compartment, with small tricompartmental marginal osteophytes. There is no evidence of erosive arthropathy or aggressive bone lesion. There is artifact from overlying sheets. IMPRESSION: 1. No evidence of acute chest disease. Emphysematous and chronic change. 2. Soft tissue fullness anteriorly extending over the patella and patellar tendon, not seen previously. Small suprapatellar bursal effusion. 3. Right knee osteoarthritis greatest along the lateral  compartment. No evidence of fractures. 4. Aortic and peripheral arterial atherosclerosis. Electronically Signed   By: Almira Bar M.D.   On: 02/24/2023 03:30   DG Forearm Left  Result Date: 02/24/2023 CLINICAL DATA:  Cough, wound check. EXAM: LEFT FOREARM - 2 VIEW COMPARISON:  None Available. FINDINGS: There is no evidence of fracture or other focal bone lesions. There is marked soft tissue swelling and ulceration overlying the posterior aspect of the forearm. There is no radiopaque foreign body. Peripheral vascular calcifications are present. IMPRESSION: Marked soft tissue swelling and ulceration overlying the posterior aspect of the forearm. No radiopaque foreign body. Electronically Signed   By: Darliss Cheney M.D.   On: 02/24/2023 03:21    Procedures Procedures    Medications Ordered in ED Medications - No data to display  ED Course/ Medical Decision Making/ A&P                             Medical Decision Making Amount and/or Complexity of Data Reviewed Labs: ordered. Radiology: ordered.   This patient presents to the ED for concern of wound check left forearm, this involves an extensive number of treatment options, and is a complaint that carries with it a high risk of complications and morbidity.  The differential diagnosis includes hematoma, concern for occult fracture to the right knee versus cellulitis   Co morbidities that complicate the patient evaluation  COPD, CAD, GERD, hyperlipidemia, paroxysmal A-fib on Eliquis, hypertension, history of sepsis and failure to thrive with frequent falls   Additional history obtained:  Additional history obtained from wife at bedside who contributes to history as above External records from outside source obtained and reviewed including prior visit for fall on 6/27, imaging reviewed   Lab Tests:  I Ordered, and personally interpreted labs.  The pertinent results include: CBC with mild extensive white count 12.3.  CMP without  significant findings.   Imaging Studies ordered:  I ordered imaging studies including XR chest, left forearm, right knee, CT right knee  I independently visualized and interpreted imaging which showed no acute findings  I agree with the radiologist interpretation    Consultations Obtained:  I requested consultation with the ER attending, Dr. Manus Gunning,  and discussed lab and imaging findings as well as pertinent plan - they recommend: CT knee,  abx for concern for developing cellulitis in the right knee   Problem List / ED Course / Critical interventions / Medication management  72 year old male presents for evaluation of left forearm wound.  Patient was seen on 02/11/2023 when he fell and had a hematoma to the left forearm.  Patient states that he tripped tonight, did not hit head, his wife is helping to pull him back up when he started having bleeding from his left forearm hematoma.  Patient is on Eliquis.  Arrives in the ER with bleeding controlled.  Wound as documented.  On further exam, is found to have swelling of the right knee with tenderness, ecchymosis, concern for possible hematoma medially.  X-ray of the right knee is negative for fracture, followed with CT to evaluate further for occult fracture/tibial plateau fracture.  No evidence of acute injury.  Concern for warmth to the knee, is discharged with Keflex for potentially developing cellulitis.  Recommend wound recheck with PCP in 2 days, return to ER as needed.  Offered home PT eval, patient and spouse declined. I have reviewed the patients home medicines and have made adjustments as needed   Social Determinants of Health:  Lives with wife Declines PT referral    Test / Admission - Considered:  Dc home, recheck with PCP         Final Clinical Impression(s) / ED Diagnoses Final diagnoses:  Visit for wound check  Hematoma of right knee region    Rx / DC Orders ED Discharge Orders          Ordered     cephALEXin (KEFLEX) 500 MG capsule  4 times daily        02/24/23 0606    bacitracin ointment  2 times daily        02/24/23 0610              Jeannie Fend, PA-C 02/24/23 0615    Glynn Octave, MD 02/26/23 [phone]

## 2023-02-24 NOTE — ED Triage Notes (Signed)
Pt arrives to ED c/o wound to left forearm. Pt reports wound was from previous fall in which he was seen for here on the 27th of last month. Pt reports return of bleeding, hardness, and warmth. Pt's arm is bandaged, bleeding controlled. Pt denies fever

## 2023-02-25 ENCOUNTER — Telehealth: Payer: Self-pay

## 2023-02-25 DIAGNOSIS — J449 Chronic obstructive pulmonary disease, unspecified: Secondary | ICD-10-CM | POA: Diagnosis not present

## 2023-02-25 NOTE — Transitions of Care (Post Inpatient/ED Visit) (Signed)
   02/25/2023  Name: ISHMAEL BERKOVICH MRN: [id number] DOB: May 30, 1951  Today's TOC FU Call Status: Today's TOC FU Call Status:: Unsuccessul Call (1st Attempt) Unsuccessful Call (1st Attempt) Date: 02/25/23  Attempted to reach the patient regarding the most recent Inpatient/ED visit.  Follow Up Plan: Additional outreach attempts will be made to reach the patient to complete the Transitions of Care (Post Inpatient/ED visit) call.   Signature  Woodfin Ganja LPN [facility] Nurse Health Advisor Direct Dial [phone]

## 2023-02-25 NOTE — Transitions of Care (Post Inpatient/ED Visit) (Signed)
   02/25/2023  Name: Zachary Chambers MRN: [id number] DOB: Nov 20, 1950  Today's TOC FU Call Status: Today's TOC FU Call Status:: Unsuccessful Call (2nd Attempt) Unsuccessful Call (1st Attempt) Date: 02/25/23 Unsuccessful Call (2nd Attempt) Date: 02/25/23  Attempted to reach the patient regarding the most recent Inpatient/ED visit.  Follow Up Plan: No further outreach attempts will be made at this time. We have been unable to contact the patient.  Pt has fu appt 02-26-23 Signature   Woodfin Ganja LPN [facility] Nurse Health Advisor Direct Dial [phone]

## 2023-02-26 ENCOUNTER — Telehealth: Payer: Self-pay | Admitting: Family Medicine

## 2023-02-26 ENCOUNTER — Ambulatory Visit: Payer: Medicare Other | Admitting: Family Medicine

## 2023-02-26 NOTE — Telephone Encounter (Signed)
Patient's wife Dewayne Hatch called to follow up on recent ED visit due to a busted hematoma. Requesting a larger quantity of saline to ensure site is cleansed properly and infection doesn't set in.  Pharmacy confirmed as  [facility]  [address] [place], [place] [address] [phone]  Please advise at [phone]

## 2023-03-17 NOTE — Telephone Encounter (Signed)
Outbound call placed to reschedule missed appointment on 02/26/23. Left message to return call.

## 2023-03-18 ENCOUNTER — Telehealth: Payer: Self-pay | Admitting: Family Medicine

## 2023-03-18 DIAGNOSIS — J449 Chronic obstructive pulmonary disease, unspecified: Secondary | ICD-10-CM | POA: Diagnosis not present

## 2023-03-18 NOTE — Telephone Encounter (Signed)
Patient's wife Dewayne Hatch called to request samples of the following:  apixaban (ELIQUIS) 5 MG TABS tablet   Budeson-Glycopyrrol-Formoterol (BREZTRI AEROSPHERE) 160-9-4.8 MCG/ACT AERO   Dispensed 2 boxes of Eliquis (low inventory), and one inhaler of Breztri.  Placed in cabinet at front desk. Dewayne Hatch will pick up samples tomorrow.

## 2023-03-24 NOTE — Telephone Encounter (Signed)
Patient's wife Dewayne Hatch picked up samples today of    apixaban (ELIQUIS) 5 MG TABS tablet    Budeson-Glycopyrrol-Formoterol (BREZTRI AEROSPHERE) 160-9-4.8 MCG/ACT AERO (see previous note in thread).   Dispensed an additional 2 boxes of apixaban (ELIQUIS) 5 MG TABS tablet giving the patient a total of 28 days.

## 2023-03-28 DIAGNOSIS — J449 Chronic obstructive pulmonary disease, unspecified: Secondary | ICD-10-CM | POA: Diagnosis not present

## 2023-03-29 ENCOUNTER — Telehealth: Payer: Self-pay | Admitting: Emergency Medicine

## 2023-03-31 ENCOUNTER — Telehealth: Payer: Self-pay | Admitting: Family Medicine

## 2023-03-31 NOTE — Telephone Encounter (Signed)
Received call from [place] with Hospice to request a verbal order for the patient to be evaluated for hospice services.  Please advise at [phone]. Ok to leave a message on secured voicemail.

## 2023-04-02 NOTE — Telephone Encounter (Signed)
Left message for Zachary Chambers to contact patients PCP or schedule an appointment regarding hospice need.

## 2023-04-09 ENCOUNTER — Other Ambulatory Visit: Payer: Self-pay | Admitting: Family Medicine

## 2023-04-09 DIAGNOSIS — I2510 Atherosclerotic heart disease of native coronary artery without angina pectoris: Secondary | ICD-10-CM

## 2023-04-09 NOTE — Telephone Encounter (Signed)
Requested medications are due for refill today.  yes  Requested medications are on the active medications list.  yes  Last refill. 10/15/2022 #180 1 rf  Future visit scheduled.   no  Notes to clinic.  Pt is overdue for CPE.    Requested Prescriptions  Pending Prescriptions Disp Refills   metoprolol tartrate (LOPRESSOR) 50 MG tablet [Pharmacy Med Name: METOPROLOL TARTRATE 50MG  TABLETS] 180 tablet 1    Sig: TAKE 1 TABLET BY MOUTH TWICE DAILY     Cardiovascular:  Beta Blockers Failed - 04/09/2023  3:11 AM      Failed - Valid encounter within last 6 months    Recent Outpatient Visits           1 year ago Chronic obstructive pulmonary disease, unspecified COPD type (HCC)   [facility] Pickard, Priscille Heidelberg, MD   1 year ago COPD exacerbation (HCC)   [facility] Pickard, Priscille Heidelberg, MD   1 year ago COPD exacerbation [facility])   [facility] Pickard, Priscille Heidelberg, MD   1 year ago COPD exacerbation [facility])   [facility] Donita Brooks, MD   2 years ago COPD exacerbation [facility])   [facility] Pickard, Priscille Heidelberg, MD              Passed - Last BP in normal range    BP Readings from Last 1 Encounters:  02/24/23 132/72         Passed - Last Heart Rate in normal range    Pulse Readings from Last 1 Encounters:  02/24/23 62

## 2023-04-18 DIAGNOSIS — S40812A Abrasion of left upper arm, initial encounter: Secondary | ICD-10-CM | POA: Diagnosis not present

## 2023-04-18 DIAGNOSIS — S80811A Abrasion, right lower leg, initial encounter: Secondary | ICD-10-CM | POA: Diagnosis not present

## 2023-04-18 DIAGNOSIS — S80812A Abrasion, left lower leg, initial encounter: Secondary | ICD-10-CM | POA: Diagnosis not present

## 2023-04-18 DIAGNOSIS — S40811A Abrasion of right upper arm, initial encounter: Secondary | ICD-10-CM | POA: Diagnosis not present

## 2023-04-18 DIAGNOSIS — S43402A Unspecified sprain of left shoulder joint, initial encounter: Secondary | ICD-10-CM | POA: Diagnosis not present

## 2023-04-18 DIAGNOSIS — J449 Chronic obstructive pulmonary disease, unspecified: Secondary | ICD-10-CM | POA: Diagnosis not present

## 2023-04-18 DIAGNOSIS — Z743 Need for continuous supervision: Secondary | ICD-10-CM | POA: Diagnosis not present

## 2023-04-18 DIAGNOSIS — S81819A Laceration without foreign body, unspecified lower leg, initial encounter: Secondary | ICD-10-CM | POA: Diagnosis not present

## 2023-04-18 DIAGNOSIS — S4992XA Unspecified injury of left shoulder and upper arm, initial encounter: Secondary | ICD-10-CM | POA: Diagnosis not present

## 2023-04-18 DIAGNOSIS — T148XXA Other injury of unspecified body region, initial encounter: Secondary | ICD-10-CM | POA: Diagnosis not present

## 2023-04-28 DIAGNOSIS — J449 Chronic obstructive pulmonary disease, unspecified: Secondary | ICD-10-CM | POA: Diagnosis not present

## 2023-05-09 NOTE — Patient Instructions (Addendum)
Visit Information  Thank you for taking time to visit with me today. Please don't hesitate to contact me if I can be of assistance to you.   Following are the goals we discussed today:   Goals Addressed             This Visit's Progress    home equipment needs RN CM       Interventions Today    Flowsheet Row Most Recent Value  Chronic Disease   Chronic disease during today's visit Other  [home equipment, continence supplies]  General Interventions   General Interventions Discussed/Reviewed General Interventions Discussed, Durable Medical Equipment (DME), Oceanographer (DME) --  [needing portable toilet, walker, bed pads]  Exercise Interventions   Exercise Discussed/Reviewed Exercise Discussed, Physical Activity  Physical Activity Discussed/Reviewed Physical Activity Discussed  Education Interventions   Education Provided Provided Education  Provided Verbal Education On Mental Health/Coping with Illness, Community Resources, Other  Mental Health Interventions   Mental Health Discussed/Reviewed Mental Health Discussed, Coping Strategies  Pharmacy Interventions   Pharmacy Dicussed/Reviewed Pharmacy Topics Discussed, Affording Medications              Our next appointment is by telephone on 05/14/23 at 1100  Please call the care guide team at [phone] if you need to cancel or reschedule your appointment.   If you are experiencing a Mental Health or Behavioral Health Crisis or need someone to talk to, please call the Suicide and Crisis Lifeline: 988 call the [place] National Suicide Prevention Lifeline: 909-456-3131 or TTY: 361-646-1684 TTY 254-188-2075) to talk to a trained counselor call 1-800-273-TALK (toll free, 24 hour hotline) call the [facility]: 8485167613 call 911   No computer access, no preference for copy of AVS      The patient has been provided with contact information for the care management team  and has been advised to call with any health related questions or concerns.   Seleste Tallman L. Noelle Penner, RN, BSN, CCM, Care Management Coordinator [phone]

## 2023-05-14 ENCOUNTER — Ambulatory Visit: Payer: Self-pay | Admitting: *Deleted

## 2023-05-14 NOTE — Patient Outreach (Signed)
Care Coordination   05/14/2023 Name: Zachary Chambers MRN: [id number] DOB: 10/20/1950   Care Coordination Outreach Attempts:  An unsuccessful telephone outreach was attempted for a scheduled appointment today.  Follow Up Plan:  Additional outreach attempts will be made to offer the patient care coordination information and services.   Encounter Outcome:  No Answer   Care Coordination Interventions:  No, not indicated    Hermine Feria L. Noelle Penner, RN, BSN, CCM, Care Management Coordinator [phone]

## 2023-05-18 DIAGNOSIS — J449 Chronic obstructive pulmonary disease, unspecified: Secondary | ICD-10-CM | POA: Diagnosis not present

## 2023-05-21 DIAGNOSIS — J449 Chronic obstructive pulmonary disease, unspecified: Secondary | ICD-10-CM | POA: Diagnosis not present

## 2023-05-27 ENCOUNTER — Telehealth: Payer: Self-pay | Admitting: *Deleted

## 2023-05-27 NOTE — Progress Notes (Signed)
Care Coordination Note  05/27/2023 Name: Zachary Chambers MRN: [id number] DOB: 19-Jul-1951  Zachary Chambers is a 72 y.o. year old male who is a primary care patient of Tanya Nones, Priscille Heidelberg, MD and is actively engaged with the care management team. I reached out to Maggie Schwalbe by phone today to assist with re-scheduling a follow up visit with the RN Case Manager  Follow up plan: Unsuccessful telephone outreach attempt made. A HIPAA compliant phone message was left for the patient providing contact information and requesting a return call.   [facility]  Care Coordination Care Guide  Direct Dial: [phone]

## 2023-05-28 DIAGNOSIS — J449 Chronic obstructive pulmonary disease, unspecified: Secondary | ICD-10-CM | POA: Diagnosis not present

## 2023-06-01 ENCOUNTER — Telehealth: Payer: Self-pay

## 2023-06-01 NOTE — Telephone Encounter (Signed)
Pt's wife called into office to ask for samples of these meds. Pt's wife stated that pt has fallen into the donut hole and cannot afford meds at this time.  These 2 sample meds were given to pt. Pt's wife would also like to know if pt is needing to be seen/or does pt have any refills left on some of his meds. Please advise.   Breztri Aerosphere Budeson-Glycopyrrol-Formoterol (BREZTRI AEROSPHERE) 160-9-4.8 MCG/ACT AERO   apixaban (ELIQUIS) 5 MG TABS tablet [id number]  Cb#: [phone]

## 2023-06-02 NOTE — Progress Notes (Signed)
Care Coordination Note  06/02/2023 Name: Zachary Chambers MRN: [id number] DOB: Jun 18, 1951  Zachary Chambers is a 72 y.o. year old male who is a primary care patient of Tanya Nones, Priscille Heidelberg, MD and is actively engaged with the care management team. I reached out to Maggie Schwalbe by phone today to assist with re-scheduling a follow up visit with the RN Case Manager  Follow up plan: Telephone appointment with care management team member scheduled for:10/24  [facility] Coordination Care Guide  Direct Dial: [phone]

## 2023-06-10 ENCOUNTER — Ambulatory Visit: Payer: Self-pay | Admitting: *Deleted

## 2023-06-10 NOTE — Patient Outreach (Signed)
Care Coordination   06/10/2023 Name: Zachary Chambers MRN: [id number] DOB: 12/07/1950   Care Coordination Outreach Attempts:  An unsuccessful telephone outreach was attempted today to offer the patient information about available care coordination services.  Follow Up Plan:  Additional outreach attempts will be made to offer the patient care coordination information and services.   Encounter Outcome:  No Answer   Care Coordination Interventions:  No, not indicated    Roby Donaway L. Noelle Penner, RN, BSN, [facility]  VBCI Care Management Coordinator  [phone]  Fax: [phone]

## 2023-06-14 ENCOUNTER — Telehealth: Payer: Self-pay

## 2023-06-14 NOTE — Telephone Encounter (Signed)
Pt's wife called in stating that pt will need a PA sent in for a prescription of pt's nebulizer and all supplies for nebulizer including oxygen. Pt's wife stated that pt's current nebulizer broke last night and is unable to use. Pt's wife stated that company is coming to pick up all current equipment today and pt will not have anything available to use after this. Pt's wife stated pt is in need of:  Nebulizer, portable oxygen tank, tubing, spacers for tubing and masks. Please advise  Pt's wife ask for nurse to please call or fax this info to [facility] [phone]  Cb#: [phone]

## 2023-06-15 NOTE — Telephone Encounter (Signed)
Spoke with Mrs. Marchant and verified equipment needed. Order faxed to [facility] at [phone]. Mjp,lpn

## 2023-06-17 ENCOUNTER — Telehealth: Payer: Self-pay

## 2023-06-17 NOTE — Telephone Encounter (Signed)
Patients wife called provider on call last night and stated that neb machine and oxygen need to be sent to [facility] [phone]. States that other medical supply company will pick up oxygen today and he can't be without it. Sometimes he uses up to 10L. Please review

## 2023-06-18 DIAGNOSIS — J449 Chronic obstructive pulmonary disease, unspecified: Secondary | ICD-10-CM | POA: Diagnosis not present

## 2023-06-21 ENCOUNTER — Telehealth: Payer: Self-pay

## 2023-06-21 NOTE — Telephone Encounter (Signed)
Received call from pt'a wife, Dewayne Hatch, who states pt has not received the nebulizer and oxygen supplies for [facility]. Called [facility] [phone] and spoke with [place], who states he will work on the issue today and have the supplies sent out today.  Called [name] back to advise and Dewayne Hatch verbalized understanding of all. Mjp,lpn

## 2023-06-22 ENCOUNTER — Telehealth: Payer: Self-pay

## 2023-06-22 NOTE — Telephone Encounter (Signed)
[facility] called in to ask for a written prescription of oxygen for pt. Please list on prescription; concentrated POC, and liters per min. Please fax this prescription to the number provided so that pt's oxygen equipment can be delivered today. Please advise.  [facility] Fax [phone]

## 2023-06-23 ENCOUNTER — Telehealth: Payer: Self-pay | Admitting: *Deleted

## 2023-06-23 NOTE — Progress Notes (Signed)
  Care Coordination Note  06/23/2023 Name: Zachary Chambers MRN: [id number] DOB: 1950-10-25  Zachary Chambers is a 72 y.o. year old male who is a primary care patient of Tanya Nones, Priscille Heidelberg, MD and is actively engaged with the care management team. I reached out to Maggie Schwalbe by phone today to assist with re-scheduling a follow up visit with the RN Case Manager  Follow up plan: Unsuccessful telephone outreach attempt made.   [facility]  Care Coordination Care Guide  Direct Dial: [phone]

## 2023-06-23 NOTE — Progress Notes (Signed)
  Care Coordination Note  06/23/2023 Name: Zachary Chambers MRN: [id number] DOB: 20-Mar-1951  Zachary Chambers is a 72 y.o. year old male who is a primary care patient of Tanya Nones, Priscille Heidelberg, MD and is actively engaged with the care management team. I reached out to Maggie Schwalbe by phone today to assist with re-scheduling a follow up visit with the RN Case Manager  Follow up plan: Telephone appointment with care management team member scheduled for:06/28/23  [facility] Coordination Care Guide  Direct Dial: [phone]

## 2023-06-28 ENCOUNTER — Ambulatory Visit: Payer: Self-pay | Admitting: *Deleted

## 2023-06-28 DIAGNOSIS — J449 Chronic obstructive pulmonary disease, unspecified: Secondary | ICD-10-CM | POA: Diagnosis not present

## 2023-06-28 NOTE — Patient Outreach (Signed)
  Care Coordination   06/28/2023 Name: Zachary Chambers MRN: [id number] DOB: 1951/01/08   Care Coordination Outreach Attempts:  An unsuccessful telephone outreach was attempted today to offer the patient information about available care coordination services.  Follow Up Plan:  Additional outreach attempts will be made to offer the patient care coordination information and services.   Encounter Outcome:  No Answer   Care Coordination Interventions:  Yes, provided    Inaara Tye L. Noelle Penner, RN, BSN, [facility]  VBCI Care Management Coordinator  [phone]  Fax: [phone]

## 2023-06-30 ENCOUNTER — Emergency Department (HOSPITAL_COMMUNITY): Payer: Medicare Other

## 2023-06-30 ENCOUNTER — Other Ambulatory Visit: Payer: Self-pay

## 2023-06-30 ENCOUNTER — Encounter (HOSPITAL_COMMUNITY): Payer: Self-pay

## 2023-06-30 ENCOUNTER — Inpatient Hospital Stay (HOSPITAL_COMMUNITY)
Admission: EM | Admit: 2023-06-30 | Discharge: 2023-07-18 | DRG: 871 | Disposition: E | Payer: Medicare Other | Attending: Pulmonary Disease | Admitting: Pulmonary Disease

## 2023-06-30 ENCOUNTER — Ambulatory Visit: Payer: Self-pay | Admitting: Family Medicine

## 2023-06-30 DIAGNOSIS — E872 Acidosis, unspecified: Secondary | ICD-10-CM | POA: Diagnosis not present

## 2023-06-30 DIAGNOSIS — N179 Acute kidney failure, unspecified: Secondary | ICD-10-CM | POA: Diagnosis present

## 2023-06-30 DIAGNOSIS — I83018 Varicose veins of right lower extremity with ulcer other part of lower leg: Secondary | ICD-10-CM | POA: Diagnosis present

## 2023-06-30 DIAGNOSIS — Z7951 Long term (current) use of inhaled steroids: Secondary | ICD-10-CM

## 2023-06-30 DIAGNOSIS — R062 Wheezing: Secondary | ICD-10-CM | POA: Diagnosis not present

## 2023-06-30 DIAGNOSIS — A403 Sepsis due to Streptococcus pneumoniae: Principal | ICD-10-CM | POA: Diagnosis present

## 2023-06-30 DIAGNOSIS — I5033 Acute on chronic diastolic (congestive) heart failure: Secondary | ICD-10-CM | POA: Diagnosis present

## 2023-06-30 DIAGNOSIS — R0902 Hypoxemia: Secondary | ICD-10-CM | POA: Diagnosis not present

## 2023-06-30 DIAGNOSIS — Z9981 Dependence on supplemental oxygen: Secondary | ICD-10-CM

## 2023-06-30 DIAGNOSIS — J9612 Chronic respiratory failure with hypercapnia: Secondary | ICD-10-CM | POA: Diagnosis not present

## 2023-06-30 DIAGNOSIS — G9341 Metabolic encephalopathy: Secondary | ICD-10-CM | POA: Diagnosis not present

## 2023-06-30 DIAGNOSIS — J189 Pneumonia, unspecified organism: Secondary | ICD-10-CM | POA: Diagnosis present

## 2023-06-30 DIAGNOSIS — Z87891 Personal history of nicotine dependence: Secondary | ICD-10-CM

## 2023-06-30 DIAGNOSIS — I480 Paroxysmal atrial fibrillation: Secondary | ICD-10-CM | POA: Diagnosis present

## 2023-06-30 DIAGNOSIS — R0602 Shortness of breath: Secondary | ICD-10-CM | POA: Diagnosis not present

## 2023-06-30 DIAGNOSIS — F05 Delirium due to known physiological condition: Secondary | ICD-10-CM | POA: Diagnosis present

## 2023-06-30 DIAGNOSIS — F1010 Alcohol abuse, uncomplicated: Secondary | ICD-10-CM | POA: Diagnosis present

## 2023-06-30 DIAGNOSIS — Z955 Presence of coronary angioplasty implant and graft: Secondary | ICD-10-CM

## 2023-06-30 DIAGNOSIS — K219 Gastro-esophageal reflux disease without esophagitis: Secondary | ICD-10-CM | POA: Diagnosis present

## 2023-06-30 DIAGNOSIS — L89899 Pressure ulcer of other site, unspecified stage: Secondary | ICD-10-CM | POA: Diagnosis not present

## 2023-06-30 DIAGNOSIS — A419 Sepsis, unspecified organism: Secondary | ICD-10-CM | POA: Diagnosis not present

## 2023-06-30 DIAGNOSIS — Z66 Do not resuscitate: Secondary | ICD-10-CM | POA: Diagnosis present

## 2023-06-30 DIAGNOSIS — R54 Age-related physical debility: Secondary | ICD-10-CM | POA: Diagnosis present

## 2023-06-30 DIAGNOSIS — Z1152 Encounter for screening for COVID-19: Secondary | ICD-10-CM | POA: Diagnosis not present

## 2023-06-30 DIAGNOSIS — J440 Chronic obstructive pulmonary disease with acute lower respiratory infection: Secondary | ICD-10-CM | POA: Diagnosis not present

## 2023-06-30 DIAGNOSIS — Z7401 Bed confinement status: Secondary | ICD-10-CM

## 2023-06-30 DIAGNOSIS — E785 Hyperlipidemia, unspecified: Secondary | ICD-10-CM | POA: Diagnosis present

## 2023-06-30 DIAGNOSIS — J9622 Acute and chronic respiratory failure with hypercapnia: Secondary | ICD-10-CM | POA: Diagnosis present

## 2023-06-30 DIAGNOSIS — Z4682 Encounter for fitting and adjustment of non-vascular catheter: Secondary | ICD-10-CM | POA: Diagnosis not present

## 2023-06-30 DIAGNOSIS — Z8701 Personal history of pneumonia (recurrent): Secondary | ICD-10-CM

## 2023-06-30 DIAGNOSIS — J9621 Acute and chronic respiratory failure with hypoxia: Secondary | ICD-10-CM | POA: Diagnosis not present

## 2023-06-30 DIAGNOSIS — Z8616 Personal history of COVID-19: Secondary | ICD-10-CM | POA: Diagnosis not present

## 2023-06-30 DIAGNOSIS — Z800 Family history of malignant neoplasm of digestive organs: Secondary | ICD-10-CM

## 2023-06-30 DIAGNOSIS — I110 Hypertensive heart disease with heart failure: Secondary | ICD-10-CM | POA: Diagnosis present

## 2023-06-30 DIAGNOSIS — J690 Pneumonitis due to inhalation of food and vomit: Secondary | ICD-10-CM | POA: Diagnosis not present

## 2023-06-30 DIAGNOSIS — Z8041 Family history of malignant neoplasm of ovary: Secondary | ICD-10-CM

## 2023-06-30 DIAGNOSIS — Z79899 Other long term (current) drug therapy: Secondary | ICD-10-CM

## 2023-06-30 DIAGNOSIS — Z833 Family history of diabetes mellitus: Secondary | ICD-10-CM

## 2023-06-30 DIAGNOSIS — I700 Atherosclerosis of aorta: Secondary | ICD-10-CM | POA: Diagnosis present

## 2023-06-30 DIAGNOSIS — I6930 Unspecified sequelae of cerebral infarction: Secondary | ICD-10-CM

## 2023-06-30 DIAGNOSIS — E861 Hypovolemia: Secondary | ICD-10-CM | POA: Diagnosis present

## 2023-06-30 DIAGNOSIS — I499 Cardiac arrhythmia, unspecified: Secondary | ICD-10-CM | POA: Diagnosis not present

## 2023-06-30 DIAGNOSIS — I83028 Varicose veins of left lower extremity with ulcer other part of lower leg: Secondary | ICD-10-CM | POA: Diagnosis present

## 2023-06-30 DIAGNOSIS — J449 Chronic obstructive pulmonary disease, unspecified: Secondary | ICD-10-CM | POA: Diagnosis not present

## 2023-06-30 DIAGNOSIS — F17200 Nicotine dependence, unspecified, uncomplicated: Secondary | ICD-10-CM | POA: Diagnosis not present

## 2023-06-30 DIAGNOSIS — Z9001 Acquired absence of eye: Secondary | ICD-10-CM

## 2023-06-30 DIAGNOSIS — R0689 Other abnormalities of breathing: Secondary | ICD-10-CM | POA: Diagnosis not present

## 2023-06-30 DIAGNOSIS — J441 Chronic obstructive pulmonary disease with (acute) exacerbation: Secondary | ICD-10-CM | POA: Diagnosis not present

## 2023-06-30 DIAGNOSIS — I469 Cardiac arrest, cause unspecified: Secondary | ICD-10-CM | POA: Diagnosis not present

## 2023-06-30 DIAGNOSIS — I2510 Atherosclerotic heart disease of native coronary artery without angina pectoris: Secondary | ICD-10-CM | POA: Diagnosis not present

## 2023-06-30 DIAGNOSIS — Z960 Presence of urogenital implants: Secondary | ICD-10-CM | POA: Diagnosis present

## 2023-06-30 DIAGNOSIS — K6389 Other specified diseases of intestine: Secondary | ICD-10-CM | POA: Diagnosis not present

## 2023-06-30 DIAGNOSIS — R918 Other nonspecific abnormal finding of lung field: Secondary | ICD-10-CM | POA: Diagnosis not present

## 2023-06-30 DIAGNOSIS — Z681 Body mass index (BMI) 19 or less, adult: Secondary | ICD-10-CM

## 2023-06-30 DIAGNOSIS — Z8711 Personal history of peptic ulcer disease: Secondary | ICD-10-CM

## 2023-06-30 DIAGNOSIS — Z7901 Long term (current) use of anticoagulants: Secondary | ICD-10-CM

## 2023-06-30 DIAGNOSIS — R64 Cachexia: Secondary | ICD-10-CM | POA: Diagnosis present

## 2023-06-30 DIAGNOSIS — R6521 Severe sepsis with septic shock: Secondary | ICD-10-CM | POA: Diagnosis not present

## 2023-06-30 DIAGNOSIS — Z7952 Long term (current) use of systemic steroids: Secondary | ICD-10-CM

## 2023-06-30 DIAGNOSIS — J90 Pleural effusion, not elsewhere classified: Secondary | ICD-10-CM | POA: Diagnosis not present

## 2023-06-30 DIAGNOSIS — G4710 Hypersomnia, unspecified: Secondary | ICD-10-CM | POA: Diagnosis present

## 2023-06-30 DIAGNOSIS — Z515 Encounter for palliative care: Secondary | ICD-10-CM | POA: Diagnosis not present

## 2023-06-30 DIAGNOSIS — Z825 Family history of asthma and other chronic lower respiratory diseases: Secondary | ICD-10-CM

## 2023-06-30 DIAGNOSIS — I10 Essential (primary) hypertension: Secondary | ICD-10-CM | POA: Diagnosis present

## 2023-06-30 DIAGNOSIS — J9611 Chronic respiratory failure with hypoxia: Secondary | ICD-10-CM | POA: Diagnosis present

## 2023-06-30 DIAGNOSIS — R509 Fever, unspecified: Secondary | ICD-10-CM | POA: Diagnosis not present

## 2023-06-30 DIAGNOSIS — L8990 Pressure ulcer of unspecified site, unspecified stage: Secondary | ICD-10-CM | POA: Insufficient documentation

## 2023-06-30 DIAGNOSIS — I4891 Unspecified atrial fibrillation: Secondary | ICD-10-CM | POA: Diagnosis not present

## 2023-06-30 DIAGNOSIS — G4700 Insomnia, unspecified: Secondary | ICD-10-CM | POA: Diagnosis present

## 2023-06-30 LAB — DIFFERENTIAL
Abs Immature Granulocytes: 1.86 10*3/uL — ABNORMAL HIGH (ref 0.00–0.07)
Basophils Absolute: 0.2 10*3/uL — ABNORMAL HIGH (ref 0.0–0.1)
Basophils Relative: 0 %
Eosinophils Absolute: 0 10*3/uL (ref 0.0–0.5)
Eosinophils Relative: 0 %
Immature Granulocytes: 4 %
Lymphocytes Relative: 1 %
Lymphs Abs: 0.4 10*3/uL — ABNORMAL LOW (ref 0.7–4.0)
Monocytes Absolute: 3 10*3/uL — ABNORMAL HIGH (ref 0.1–1.0)
Monocytes Relative: 6 %
Neutro Abs: 43.3 10*3/uL — ABNORMAL HIGH (ref 1.7–7.7)
Neutrophils Relative %: 89 %

## 2023-06-30 LAB — CBC
HCT: 40.4 % (ref 39.0–52.0)
Hemoglobin: 11.9 g/dL — ABNORMAL LOW (ref 13.0–17.0)
MCH: 25.2 pg — ABNORMAL LOW (ref 26.0–34.0)
MCHC: 29.5 g/dL — ABNORMAL LOW (ref 30.0–36.0)
MCV: 85.4 fL (ref 80.0–100.0)
Platelets: 529 10*3/uL — ABNORMAL HIGH (ref 150–400)
RBC: 4.73 MIL/uL (ref 4.22–5.81)
RDW: 16.5 % — ABNORMAL HIGH (ref 11.5–15.5)
WBC: 49.7 10*3/uL — ABNORMAL HIGH (ref 4.0–10.5)
nRBC: 0 % (ref 0.0–0.2)

## 2023-06-30 LAB — BASIC METABOLIC PANEL
Anion gap: 11 (ref 5–15)
BUN: 33 mg/dL — ABNORMAL HIGH (ref 8–23)
CO2: 29 mmol/L (ref 22–32)
Calcium: 9.2 mg/dL (ref 8.9–10.3)
Chloride: 98 mmol/L (ref 98–111)
Creatinine, Ser: 1.7 mg/dL — ABNORMAL HIGH (ref 0.61–1.24)
GFR, Estimated: 42 mL/min — ABNORMAL LOW (ref >60)
Glucose, Bld: 151 mg/dL — ABNORMAL HIGH (ref 70–99)
Potassium: 4.8 mmol/L (ref 3.5–5.1)
Sodium: 138 mmol/L (ref 135–145)

## 2023-06-30 LAB — TROPONIN I (HIGH SENSITIVITY)
Troponin I (High Sensitivity): 96 ng/L — ABNORMAL HIGH (ref <18)
Troponin I (High Sensitivity): 84 ng/L — ABNORMAL HIGH (ref <18)

## 2023-06-30 LAB — BRAIN NATRIURETIC PEPTIDE
B Natriuretic Peptide: 696.2 pg/mL — ABNORMAL HIGH (ref 0.0–100.0)
B Natriuretic Peptide: 711.1 pg/mL — ABNORMAL HIGH (ref 0.0–100.0)

## 2023-06-30 LAB — LACTIC ACID, PLASMA
Lactic Acid, Venous: 1.9 mmol/L (ref 0.5–1.9)
Lactic Acid, Venous: 2.2 mmol/L (ref 0.5–1.9)

## 2023-06-30 MED ORDER — METHYLPREDNISOLONE SODIUM SUCC 40 MG IJ SOLR
40.0000 mg | Freq: Two times a day (BID) | INTRAMUSCULAR | Status: DC
  Administered 2023-07-01 – 2023-07-02 (×3): 40 mg via INTRAVENOUS
  Filled 2023-06-30 (×4): qty 1

## 2023-06-30 MED ORDER — SODIUM CHLORIDE 0.9 % IV SOLN
2.0000 g | Freq: Two times a day (BID) | INTRAVENOUS | Status: DC
  Administered 2023-06-30: 2 g via INTRAVENOUS
  Filled 2023-06-30 (×2): qty 12.5

## 2023-06-30 MED ORDER — ATORVASTATIN CALCIUM 40 MG PO TABS
40.0000 mg | ORAL_TABLET | Freq: Every day | ORAL | Status: DC
  Administered 2023-06-30 – 2023-07-05 (×5): 40 mg via ORAL
  Filled 2023-06-30 (×6): qty 1

## 2023-06-30 MED ORDER — DEXTROSE 5 % IV SOLN
500.0000 mg | Freq: Once | INTRAVENOUS | Status: DC
  Administered 2023-06-30: 500 mg via INTRAVENOUS
  Filled 2023-06-30: qty 5

## 2023-06-30 MED ORDER — QUETIAPINE FUMARATE 25 MG PO TABS
25.0000 mg | ORAL_TABLET | Freq: Every day | ORAL | Status: DC
Start: 2023-06-30 — End: 2023-07-06
  Administered 2023-06-30 – 2023-07-05 (×5): 25 mg via ORAL
  Filled 2023-06-30 (×6): qty 1

## 2023-06-30 MED ORDER — BUDESON-GLYCOPYRROL-FORMOTEROL 160-9-4.8 MCG/ACT IN AERO: 2.0000 | INHALATION_SPRAY | Freq: Two times a day (BID) | RESPIRATORY_TRACT | Status: DC

## 2023-06-30 MED ORDER — ALBUTEROL SULFATE (2.5 MG/3ML) 0.083% IN NEBU
2.5000 mg | INHALATION_SOLUTION | RESPIRATORY_TRACT | Status: DC | PRN
  Administered 2023-07-02 – 2023-07-05 (×4): 2.5 mg via RESPIRATORY_TRACT
  Filled 2023-06-30 (×4): qty 3

## 2023-06-30 MED ORDER — ISOSORBIDE MONONITRATE ER 30 MG PO TB24
15.0000 mg | ORAL_TABLET | Freq: Every day | ORAL | Status: DC
  Administered 2023-07-01: 15 mg via ORAL
  Filled 2023-06-30: qty 1

## 2023-06-30 MED ORDER — MIDODRINE HCL 5 MG PO TABS
10.0000 mg | ORAL_TABLET | ORAL | Status: AC
  Administered 2023-06-30: 10 mg via ORAL
  Filled 2023-06-30: qty 2

## 2023-06-30 MED ORDER — METHYLPREDNISOLONE SODIUM SUCC 125 MG IJ SOLR
125.0000 mg | Freq: Once | INTRAMUSCULAR | Status: AC
  Administered 2023-06-30: 125 mg via INTRAVENOUS
  Filled 2023-06-30: qty 2

## 2023-06-30 MED ORDER — ENOXAPARIN SODIUM 40 MG/0.4ML IJ SOSY: 40.0000 mg | PREFILLED_SYRINGE | INTRAMUSCULAR | Status: DC

## 2023-06-30 MED ORDER — ACETAMINOPHEN 325 MG PO TABS: 650.0000 mg | ORAL_TABLET | Freq: Four times a day (QID) | ORAL | Status: DC | PRN

## 2023-06-30 MED ORDER — SODIUM CHLORIDE 0.9 % IV BOLUS
1000.0000 mL | Freq: Once | INTRAVENOUS | Status: AC
  Administered 2023-06-30: 1000 mL via INTRAVENOUS

## 2023-06-30 MED ORDER — IPRATROPIUM-ALBUTEROL 0.5-2.5 (3) MG/3ML IN SOLN
3.0000 mL | Freq: Once | RESPIRATORY_TRACT | Status: AC
  Administered 2023-06-30: 3 mL via RESPIRATORY_TRACT
  Filled 2023-06-30: qty 3

## 2023-06-30 MED ORDER — ACETAMINOPHEN 650 MG RE SUPP: 650.0000 mg | Freq: Four times a day (QID) | RECTAL | Status: DC | PRN

## 2023-06-30 MED ORDER — METOPROLOL TARTRATE 50 MG PO TABS
50.0000 mg | ORAL_TABLET | Freq: Two times a day (BID) | ORAL | Status: DC
  Administered 2023-06-30 – 2023-07-01 (×2): 50 mg via ORAL
  Filled 2023-06-30 (×2): qty 1

## 2023-06-30 MED ORDER — IPRATROPIUM-ALBUTEROL 0.5-2.5 (3) MG/3ML IN SOLN
3.0000 mL | Freq: Four times a day (QID) | RESPIRATORY_TRACT | Status: DC
  Administered 2023-07-01 (×3): 3 mL via RESPIRATORY_TRACT
  Filled 2023-06-30 (×3): qty 3

## 2023-06-30 MED ORDER — SODIUM CHLORIDE 0.9 % IV BOLUS
250.0000 mL | INTRAVENOUS | Status: AC
  Administered 2023-06-30: 250 mL via INTRAVENOUS

## 2023-06-30 MED ORDER — APIXABAN 2.5 MG PO TABS
2.5000 mg | ORAL_TABLET | Freq: Two times a day (BID) | ORAL | Status: DC
  Administered 2023-06-30 – 2023-07-06 (×11): 2.5 mg via ORAL
  Filled 2023-06-30 (×12): qty 1

## 2023-06-30 MED ORDER — DEXTROSE-SODIUM CHLORIDE 5-0.45 % IV SOLN
INTRAVENOUS | Status: AC
Start: 2023-06-30 — End: 2023-07-01

## 2023-06-30 NOTE — ED Notes (Signed)
ED TO INPATIENT HANDOFF REPORT  ED Nurse Name and Phone #: [phone]  S Name/Age/Gender Zachary Chambers 72 y.o. male Room/Bed: 038C/038C  Code Status   Code Status: Full Code  Home/SNF/Other Home Patient oriented to: self, place, time, and situation Is this baseline? Yes   Triage Complete: Triage complete  Chief Complaint CAP (community acquired pneumonia) [J18.9]  Triage Note PT was  at home this am when he started having difficulty breathing. PT took one of his albuterol tx and it progressively got worse. PT does have a hx of COPD. When EMS arrived, PT was stating at 89% and expiratory wheezing was heard on right side, they administered 1 albuterol tx and stats came up to 90%. En route PT started complaining of not being able to breathe, another albuterol tx was administered bringing stats to 95-97%. PT is on 3L O2 baseline   Allergies No Known Allergies  Level of Care/Admitting Diagnosis ED Disposition     ED Disposition  Admit   Condition  --   Comment  Hospital Area: [facility] [[id number]]  Level of Care: Med-Surg [16]  May admit patient to [facility] or [facility] if equivalent level of care is available:: No  Covid Evaluation: Symptomatic Person Under Investigation (PUI) or recent exposure (last 10 days) *Testing Required*  Diagnosis: CAP (community acquired pneumonia) [865784]  Admitting Physician: Erenest Blank  Attending Physician: Erenest Blank  Certification:: I certify this patient will need inpatient services for at least 2 midnights  Expected Medical Readiness: 07/04/2023          B Medical/Surgery History Past Medical History:  Diagnosis Date   ACS (acute coronary syndrome) (HCC) 04/19/2021   Alcohol abuse    Aortic atherosclerosis (HCC)    CAD in native artery    Chronic respiratory failure (HCC)    COPD (chronic obstructive pulmonary disease) (HCC)    DJD (degenerative joint disease)    diffusely    GERD (gastroesophageal reflux disease)    Hyperlipidemia    On home O2    Paroxysmal atrial fibrillation (HCC)    Pericardial effusion    Pneumonia due to COVID-19 virus    PUD (peptic ulcer disease)    with bleeding   Rotator cuff disorder    has been evaluated by Dr Wyonia Matthies and [facility]   Stroke (cerebrum) [facility])    Past Surgical History:  Procedure Laterality Date   COLONOSCOPY     EYE SURGERY     right eye removed at age 28   heart stent     x3- 2006     A IV Location/Drains/Wounds Patient Lines/Drains/Airways Status     Active Line/Drains/Airways     Name Placement date Placement time Site Days   Peripheral IV 06/30/23 20 G Right Antecubital 06/30/23  1703  Antecubital  less than 1   Wound / Incision (Open or Dehisced) 04/19/21 Skin tear Back Left;Mid 04/19/21  1430  Back  802   Wound / Incision (Open or Dehisced) 07/27/22 Skin tear Elbow Left;Posterior 07/27/22  1830  Elbow  338   Wound / Incision (Open or Dehisced) 07/27/22 Skin tear Pretibial Left 07/27/22  1830  Pretibial  338   Wound / Incision (Open or Dehisced) 07/27/22 Skin tear Knee Anterior;Right 07/27/22  1830  Knee  338   Wound / Incision (Open or Dehisced) 07/27/22 Knee Anterior;Right 07/27/22  1830  Knee  338  Intake/Output Last 24 hours No intake or output data in the 24 hours ending 06/30/23 1913  Labs/Imaging Results for orders placed or performed during the hospital encounter of 06/30/23 (from the past 48 hour(s))  CBC     Status: Abnormal   Collection Time: 06/30/23  5:00 PM  Result Value Ref Range   WBC 49.7 (H) 4.0 - 10.5 K/uL    Comment: WHITE COUNT CONFIRMED ON SMEAR   RBC 4.73 4.22 - 5.81 MIL/uL   Hemoglobin 11.9 (L) 13.0 - 17.0 g/dL   HCT 91.4 78.2 - 95.6 %   MCV 85.4 80.0 - 100.0 fL   MCH 25.2 (L) 26.0 - 34.0 pg   MCHC 29.5 (L) 30.0 - 36.0 g/dL   RDW 21.3 (H) 08.6 - 57.8 %   Platelets 529 (H) 150 - 400 K/uL   nRBC 0.0 0.0 - 0.2 %    Comment: Performed at [facility], [address], [place], [place] [address]  Basic metabolic panel     Status: Abnormal   Collection Time: 06/30/23  5:00 PM  Result Value Ref Range   Sodium 138 135 - 145 mmol/L   Potassium 4.8 3.5 - 5.1 mmol/L   Chloride 98 98 - 111 mmol/L   CO2 29 22 - 32 mmol/L   Glucose, Bld 151 (H) 70 - 99 mg/dL    Comment: Glucose reference range applies only to samples taken after fasting for at least 8 hours.   BUN 33 (H) 8 - 23 mg/dL   Creatinine, Ser 9.52 (H) 0.61 - 1.24 mg/dL   Calcium 9.2 8.9 - 84.1 mg/dL   GFR, Estimated 42 (L) >60 mL/min    Comment: (NOTE) Calculated using the CKD-EPI Creatinine Equation (2021)    Anion gap 11 5 - 15    Comment: Performed at [facility], [address] [place], [place] [address]  Differential     Status: Abnormal   Collection Time: 06/30/23  5:00 PM  Result Value Ref Range   Neutrophils Relative % 89 %   Neutro Abs 43.3 (H) 1.7 - 7.7 K/uL   Lymphocytes Relative 1 %   Lymphs Abs 0.4 (L) 0.7 - 4.0 K/uL   Monocytes Relative 6 %   Monocytes Absolute 3.0 (H) 0.1 - 1.0 K/uL   Eosinophils Relative 0 %   Eosinophils Absolute 0.0 0.0 - 0.5 K/uL   Basophils Relative 0 %   Basophils Absolute 0.2 (H) 0.0 - 0.1 K/uL   Immature Granulocytes 4 %   Abs Immature Granulocytes 1.86 (H) 0.00 - 0.07 K/uL    Comment: Performed at [facility], [address], [place], [place] [address]  Lactic acid, plasma     Status: None   Collection Time: 06/30/23  6:30 PM  Result Value Ref Range   Lactic Acid, Venous 1.9 0.5 - 1.9 mmol/L    Comment: Performed at [facility], [address], [place], [place] [address]   DG Chest Portable 1 View  Result Date: 06/30/2023 CLINICAL DATA:  COPD EXAM: PORTABLE CHEST 1 VIEW COMPARISON:  02/24/2023 FINDINGS: Stable heart size. Hyperlucent lungs. Patchy airspace consolidation at the left lung base. No pleural effusion or pneumothorax. IMPRESSION: Patchy airspace consolidation at the left lung base,  suspicious for pneumonia. Electronically Signed   By: Duanne Guess D.O.   On: 06/30/2023 18:30    Pending Labs [employer] (From admission, onward)     Start     Ordered  07/01/23 0500  Basic metabolic panel  Tomorrow morning,   R        06/30/23 1905   07/01/23 0500  CBC  Tomorrow morning,   R        06/30/23 1905   07/01/23 0500  HIV Antibody (routine testing w rflx)  (HIV Antibody (Routine testing w reflex) panel)  Tomorrow morning,   R        06/30/23 1905   06/30/23 1906  MRSA Next Gen by PCR, Nasal  Once,   R        06/30/23 1905   06/30/23 1906  SARS Coronavirus 2 by RT PCR (hospital order, performed in [facility] hospital lab) *cepheid single result test* Anterior Nasal Swab  (Tier 2 - SARS Coronavirus 2 by RT PCR (hospital order, performed in [facility] hospital lab) *cepheid single result test*)  Once,   R        06/30/23 1905   06/30/23 1906  Respiratory (~20 pathogens) panel by PCR  (Respiratory panel by PCR (~20 pathogens, ~24 hr TAT)  w precautions)  Once,   R        06/30/23 1905   06/30/23 1906  Strep pneumoniae urinary antigen  Once,   R        06/30/23 1905   06/30/23 1906  Legionella Pneumophila Serogp 1 Ur Ag  Once,   R        06/30/23 1905   06/30/23 1906  Expectorated Sputum Assessment w Gram Stain, Rflx to Resp Cult  Once,   R        06/30/23 1905   06/30/23 1906  Brain natriuretic peptide  Add-on,   AD        06/30/23 1905   06/30/23 1843  Brain natriuretic peptide  Add-on,   AD        06/30/23 1842   06/30/23 1800  Lactic acid, plasma  (Lactic Acid)  Now then every 2 hours,   R (with STAT occurrences)      06/30/23 1800   06/30/23 1800  Culture, blood (routine x 2)  BLOOD CULTURE X 2,   R (with STAT occurrences)      06/30/23 1800            Vitals/Pain Today's Vitals   06/30/23 1611 06/30/23 1612 06/30/23 1614 06/30/23 1800  BP:   115/77 112/67  Pulse:   (!) 125 (!) 109  Resp:   (!) 31 (!) 28  Temp:   98.2 F (36.8 C) 98.2 F (36.8  C)  TempSrc:   Oral Axillary  SpO2: 92%  92% 91%  Weight:  59 kg    Height:  5\' 8"  (1.727 m)    PainSc: 0-No pain       Isolation Precautions Droplet precaution  Medications Medications  atorvastatin (LIPITOR) tablet 40 mg (has no administration in time range)  metoprolol tartrate (LOPRESSOR) tablet 50 mg (has no administration in time range)  isosorbide mononitrate (IMDUR) 24 hr tablet 15 mg (has no administration in time range)  QUEtiapine (SEROQUEL) tablet 25 mg (has no administration in time range)  enoxaparin (LOVENOX) injection 40 mg (has no administration in time range)  dextrose 5 % and 0.45 % NaCl infusion (has no administration in time range)  acetaminophen (TYLENOL) tablet 650 mg (has no administration in time range)    Or  acetaminophen (TYLENOL) suppository 650 mg (has no administration in time range)  methylPREDNISolone sodium succinate (SOLU-MEDROL) 40 mg/mL injection 40  mg (has no administration in time range)  ipratropium-albuterol (DUONEB) 0.5-2.5 (3) MG/3ML nebulizer solution 3 mL (has no administration in time range)  albuterol (PROVENTIL) (2.5 MG/3ML) 0.083% nebulizer solution 2.5 mg (has no administration in time range)  ceFEPIme (MAXIPIME) 2 g in sodium chloride 0.9 % 100 mL IVPB (has no administration in time range)  methylPREDNISolone sodium succinate (SOLU-MEDROL) 125 mg/2 mL injection 125 mg (125 mg Intravenous Given 06/30/23 1703)  ipratropium-albuterol (DUONEB) 0.5-2.5 (3) MG/3ML nebulizer solution 3 mL (3 mLs Nebulization Given 06/30/23 1652)  ipratropium-albuterol (DUONEB) 0.5-2.5 (3) MG/3ML nebulizer solution 3 mL (3 mLs Nebulization Given 06/30/23 1759)  sodium chloride 0.9 % bolus 1,000 mL (1,000 mLs Intravenous New Bag/Given 06/30/23 1801)    Mobility walks     Focused Assessments Pulmonary Assessment Handoff:  Lung sounds: Bilateral Breath Sounds: Expiratory wheezes R Breath Sounds: Expiratory wheezes O2 Device: Nasal Cannula O2 Flow Rate  (L/min): 3 L/min    R Recommendations: See Admitting Provider Note  Report given to:   Additional Notes:

## 2023-06-30 NOTE — Telephone Encounter (Signed)
Copied from CRM [id number]. Topic: Clinical - Red Word Triage >> Jun 30, 2023  1:24 PM Larwance Sachs wrote: Red Word that prompted transfer to Nurse Triage: Zachary Chambers because husband number is really high and is breathing hard and wife is thinking patient should be taken to hospital and wife has been awake 3 days and can not drive top numbe 045/ 64   Chief Complaint:  Husband  is having difficulty breathing. Symptoms: Wife states her husband has been short of breath and confused  and running fever for 3 days. Frequency: 3 days Disposition: [x] ED /[] Urgent Care (no appt availability in office) / [] Appointment(In office/virtual)/ []  [facility]/ [] Home Care/ [] Refused Recommended Disposition /[] [facility]/ []  Follow-up with PCP Additional Notes:  Wife stated he has a finger pulse ox is showing  60-71 Pulse 11-112 Reason for Disposition  SEVERE difficulty breathing (e.g., struggling for each breath, speaks in single words)  Answer Assessment - Initial Assessment Questions 1. RESPIRATORY STATUS: "Describe your breathing?" (e.g., wheezing, shortness of breath, unable to speak, severe coughing)       Very  Short of Breath 2. ONSET: "When did this breathing problem begin?"      3 days ago 3. PATTERN "Does the difficult breathing come and go, or has it been constant since it started?"       Yes,  3 days ago 4. SEVERITY: "How bad is your breathing?"     - SEVERE: Very SOB at rest, speaks in single words, struggling to breathe, sitting hunched forward, retractions, pulse > 120          7. LUNG HISTORY: "Do you have any history of lung disease?"     COPD 8. CAUSE: "What do you think is causing the breathing problem?"     COPD and he is burning up with Fever. 9. OTHER SYMPTOMS: "Do you have any other symptoms? (e.g., dizziness, runny nose, cough, chest pain, fever)     Fever 10. O2 SATURATION MONITOR:  "Do you use an oxygen saturation monitor (pulse oximeter) at home?" If Yes, ask:  "What is your reading (oxygen level) today?" "What is your usual oxygen saturation reading?" (e.g., 95%)      Pulse ox 61 and his pulse ranage is 111-121  Protocols used: Breathing Difficulty-A-AH

## 2023-06-30 NOTE — ED Provider Notes (Signed)
[place] EMERGENCY DEPARTMENT AT [facility] Provider Note   CSN: [id number] Arrival date & time: 06/30/23  1547     History {Add pertinent medical, surgical, social history, OB history to HPI:1} Chief Complaint  Patient presents with   Shortness of Breath    Tracie T Vanvalkenburg is a 72 y.o. male.  This is a 72 year old male to the emergency department for shortness of breath.  Patient is COPD with baseline 3 L oxygen use.  He reportedly has had worsening shortness of breath over the past several days.  Seemingly worsened today.  Reports increased cough with change in sputum color.  No fevers, chills.  No chest pain.  Received albuterol x 2 prior to arrival with EMS.  Reports breathing somewhat improved.   Shortness of Breath      Home Medications Prior to Admission medications   Medication Sig Start Date End Date Taking? Authorizing Provider  acetaminophen (TYLENOL) 325 MG tablet Take 2 tablets (650 mg total) by mouth every 6 (six) hours as needed for mild pain (or Fever >/= 101). 07/31/22   Arrien, York Ram, MD  albuterol (PROVENTIL) (2.5 MG/3ML) 0.083% nebulizer solution Take 3 mLs (2.5 mg total) by nebulization every 4 (four) hours as needed for wheezing or shortness of breath. 10/29/22   Donita Brooks, MD  apixaban (ELIQUIS) 5 MG TABS tablet Take 1 tablet (5 mg total) by mouth 2 (two) times daily. 10/15/22   Donita Brooks, MD  atorvastatin (LIPITOR) 40 MG tablet Take 1 tablet (40 mg total) by mouth at bedtime. 10/15/22   Donita Brooks, MD  bacitracin ointment Apply 1 Application topically 2 (two) times daily. 02/24/23   Jeannie Fend, PA-C  benzonatate (TESSALON PERLES) 100 MG capsule Take 1 capsule (100 mg total) by mouth 3 (three) times daily as needed for cough. 10/15/22   Donita Brooks, MD  Blood Glucose Calibration (TRUE METRIX LEVEL 1) Low SOLN USE AS DIRECTED. To monitor fsbs 2 times per day dx e11.9 06/02/21   Donita Brooks, MD  Blood  Glucose Monitoring Suppl (TRUE METRIX AIR GLUCOSE METER) DEVI USE AS DIRECTED. To monitor fsbs 2 times per day dx e11.9 06/02/21   Donita Brooks, MD  Budeson-Glycopyrrol-Formoterol (BREZTRI AEROSPHERE) 160-9-4.8 MCG/ACT AERO Inhale 2 puffs into the lungs 2 (two) times daily. 10/15/22   Donita Brooks, MD  Cholecalciferol (D3 PO) Take 1 tablet by mouth daily.    [provider]  clotrimazole-betamethasone (LOTRISONE) cream APPLY TO SKIN 2 TIMES DAILY 10/15/22   Donita Brooks, MD  Cyanocobalamin (VITAMIN B-12 PO) Take 1 tablet by mouth daily.    [provider]  fluticasone (FLONASE) 50 MCG/ACT nasal spray Place 2 sprays into both nostrils daily. 10/15/22   Donita Brooks, MD  glucose blood (RELION TRUE METRIX TEST STRIPS) test strip USE AS DIRECTED. To monitor fsbs 2 times per day dx e11.9 06/02/21   Donita Brooks, MD  isosorbide mononitrate (IMDUR) 30 MG 24 hr tablet Take 0.5 tablets (15 mg total) by mouth daily. 10/15/22   Donita Brooks, MD  magnesium oxide (MAG-OX) 400 (240 Mg) MG tablet Take 1 tablet (400 mg total) by mouth daily. 10/15/22   Donita Brooks, MD  metoprolol tartrate (LOPRESSOR) 50 MG tablet TAKE 1 TABLET BY MOUTH TWICE DAILY 04/13/23   Donita Brooks, MD  Multiple Vitamin (MULTIVITAMIN WITH MINERALS) TABS tablet Take 1 tablet by mouth daily. 10/15/22   Donita Brooks,  MD  nitroGLYCERIN (NITROSTAT) 0.4 MG SL tablet DISSOLVE 1 TABLET UNDER THE TONGUE EVERY 5 MINUTES AS NEEDED FOR CHEST PAIN. 10/15/22   Donita Brooks, MD  omeprazole (PRILOSEC) 40 MG capsule Take 1 capsule (40 mg total) by mouth daily. 10/15/22   Donita Brooks, MD  predniSONE (DELTASONE) 10 MG tablet Take 1 tablet (10 mg total) by mouth every morning. 10/15/22   Donita Brooks, MD  QUEtiapine (SEROQUEL) 25 MG tablet Take 1 tablet (25 mg total) by mouth at bedtime. 10/15/22   Donita Brooks, MD      Allergies    Patient has no known allergies.    Review of Systems    Review of Systems  Respiratory:  Positive for shortness of breath.     Physical Exam Updated Vital Signs There were no vitals taken for this visit. Physical Exam Vitals and nursing note reviewed.  Constitutional:      Comments: Chronically ill-appearing  Cardiovascular:     Rate and Rhythm: Regular rhythm. Tachycardia present.  Pulmonary:     Effort: Tachypnea and accessory muscle usage present.     Breath sounds: Wheezing present.     Comments: Mild to moderate respiratory distress with increased accessory muscle use.  Decreased air movement, some expiratory wheezes noted in the bases.  Speaking in short sentences. Musculoskeletal:     Comments: Bilateral lower extremities are bandaged with gauze and Coban.  Skin:    General: Skin is warm and dry.     Capillary Refill: Capillary refill takes less than 2 seconds.  Neurological:     General: No focal deficit present.     Mental Status: He is alert.  Psychiatric:        Mood and Affect: Mood normal.        Behavior: Behavior normal.     ED Results / Procedures / Treatments   Labs (all labs ordered are listed, but only abnormal results are displayed) Labs Reviewed  CBC  BASIC METABOLIC PANEL    EKG None  Radiology No results found.  Procedures Procedures  {Document cardiac monitor, telemetry assessment procedure when appropriate:1}  Medications Ordered in ED Medications  methylPREDNISolone sodium succinate (SOLU-MEDROL) 125 mg/2 mL injection 125 mg (has no administration in time range)  ipratropium-albuterol (DUONEB) 0.5-2.5 (3) MG/3ML nebulizer solution 3 mL (has no administration in time range)    ED Course/ Medical Decision Making/ A&P   {   Click here for ABCD2, HEART and other calculatorsREFRESH Note before signing :1}                              Medical Decision Making Is a 72 year old male present emergency department for shortness of breath in the setting of chronic respiratory failure due to COPD.   Baseline 3 L of oxygen use.  EMS reported patient saturating 89% on his home 3 L; also received albuterol x 2 prior to arrival with improvement of his respiratory status. No steroids PTA.  Per chart review it also appears that he has paroxysmal A-fib is on Eliquis.  On exam he is in mild to moderate respiratory distress, some wheezing noted.  Will get basic labs, chest x-ray to evaluate for pneumonia.  Will give breathing treatment and solumedrol and continue to monitor.   See ED course for further MDM disposition.  Amount and/or Complexity of Data Reviewed Labs: ordered. Decision-making details documented in ED Course. Radiology: ordered. Decision-making details  documented in ED Course. ECG/medicine tests: ordered. Decision-making details documented in ED Course.  Risk Prescription drug management. Decision regarding hospitalization. Risk Details: See ED course for final MDM.    {Document critical care time when appropriate:1} {Document review of labs and clinical decision tools ie heart score, Chads2Vasc2 etc:1}  {Document your independent review of radiology images, and any outside records:1} {Document your discussion with family members, caretakers, and with consultants:1} {Document social determinants of health affecting pt's care:1} {Document your decision making why or why not admission, treatments were needed:1} Final Clinical Impression(s) / ED Diagnoses Final diagnoses:  None    Rx / DC Orders ED Discharge Orders     None

## 2023-06-30 NOTE — ED Notes (Signed)
Labs drawn, antibiotics started. Report given to [employer])

## 2023-06-30 NOTE — ED Triage Notes (Signed)
PT was  at home this am when he started having difficulty breathing. PT took one of his albuterol tx and it progressively got worse. PT does have a hx of COPD. When EMS arrived, PT was stating at 89% and expiratory wheezing was heard on right side, they administered 1 albuterol tx and stats came up to 90%. En route PT started complaining of not being able to breathe, another albuterol tx was administered bringing stats to 95-97%. PT is on 3L O2 baseline

## 2023-06-30 NOTE — Progress Notes (Signed)
Pharmacy Antibiotic Note  Zachary Chambers is a 72 y.o. male admitted on 06/30/2023 with pneumonia. WBC elevated but pt did receive significant dose of steroids. Otherwise afebrile, tachycardic but vital signs stable. Pharmacy has been consulted for cefepime dosing.  Plan: Initiate cefepime 2g q12h F/u duration of therapy and culture data Monitor renal function for dose adjustments  Height: 5\' 8"  (172.7 cm) Weight: 59 kg (130 lb 1.1 oz) IBW/kg (Calculated) : 68.4  Temp (24hrs), Avg:98.2 F (36.8 C), Min:98.2 F (36.8 C), Ko:98.2 F (36.8 C)  Recent Labs  Lab 06/30/23 1700 06/30/23 1830  WBC 49.7*  --   CREATININE 1.70*  --   LATICACIDVEN  --  1.9    Estimated Creatinine Clearance: 31.8 mL/min (A) (by C-G formula based on SCr of 1.7 mg/dL (H)).    No Known Allergies  Antimicrobials this admission: Azithro 11/13 Cefepime 11/13 >>  Microbiology results: 11/13 BCx:  11/13 Sputum:   11/13 MRSA PCR:   Thank you for allowing pharmacy to be a part of this patient's care.  [name], PharmD PGY2 Critical Care Pharmacy Resident 06/30/2023 7:10 PM

## 2023-06-30 NOTE — Progress Notes (Signed)
PHARMACY - ANTICOAGULATION CONSULT NOTE  Pharmacy Consult for Eliquis Indication: atrial fibrillation  No Known Allergies  Patient Measurements: Height: 5\' 8"  (172.7 cm) Weight: 59 kg (130 lb 1.1 oz) IBW/kg (Calculated) : 68.4 Dosing Weight: 59 kg  Vital Signs: Temp: 98.2 F (36.8 C) (11/13 1800) Temp Source: Axillary (11/13 1800) BP: 112/67 (11/13 1800) Pulse Rate: 109 (11/13 1800)  Labs: Recent Labs    06/30/23 1700  HGB 11.9*  HCT 40.4  PLT 529*  CREATININE 1.70*    Estimated Creatinine Clearance: 31.8 mL/min (A) (by C-G formula based on SCr of 1.7 mg/dL (H)).   Medical History: Past Medical History:  Diagnosis Date   ACS (acute coronary syndrome) (HCC) 04/19/2021   Alcohol abuse    Aortic atherosclerosis (HCC)    CAD in native artery    Chronic respiratory failure (HCC)    COPD (chronic obstructive pulmonary disease) (HCC)    DJD (degenerative joint disease)    diffusely   GERD (gastroesophageal reflux disease)    Hyperlipidemia    On home O2    Paroxysmal atrial fibrillation (HCC)    Pericardial effusion    Pneumonia due to COVID-19 virus    PUD (peptic ulcer disease)    with bleeding   Rotator cuff disorder    has been evaluated by Dr Zachary Chambers and [facility]   Stroke (cerebrum) [facility])    Assessment: [phone] presenting with dyspnea, on apixaban prior to admission for atrial fibrillation as well as hx of stroke. Currently has AKI with Scr 1.7 from baseline ~ 1.0, CBC stable.   Plan:  Apixaban 2.5 mg BID (age 72, Scr 1.7, wt 59 kg) F/u renal function improvement for dose adjustment Monitor CBC daily and PRN  Zachary Chambers, PharmD PGY2 Critical Care Pharmacy Resident 06/30/2023,7:29 PM

## 2023-06-30 NOTE — Progress Notes (Signed)
Critical Lab values; Lactic Acid 2.2. MD on call notified.

## 2023-06-30 NOTE — H&P (Addendum)
History and Physical    KEMP WORLD UYQ:[id number] DOB: Jul 16, 1951 DOA: 06/30/2023  PCP: Donita Brooks, MD  Patient coming from: home   Chief Complaint: dyspnea  HPI: Zachary Chambers is a 72 y.o. male with medical history significant for advanced copd, cad, o2 dependent 3 liters, cva, a fib, htn, presenting with dyspnea and cough.  Present for several days, reports worsening dyspnea, worsening cough, productive cough. Also subjective fevers. No swelling, able to lie flat. No chest pain. No n/v/d. Some wheezing. Denies recent antibiotics. Hasn't yet received treatment for this problem elsewhere.   Review of Systems: As per HPI otherwise 10 point review of systems negative.    Past Medical History:  Diagnosis Date   ACS (acute coronary syndrome) (HCC) 04/19/2021   Alcohol abuse    Aortic atherosclerosis (HCC)    CAD in native artery    Chronic respiratory failure (HCC)    COPD (chronic obstructive pulmonary disease) (HCC)    DJD (degenerative joint disease)    diffusely   GERD (gastroesophageal reflux disease)    Hyperlipidemia    On home O2    Paroxysmal atrial fibrillation (HCC)    Pericardial effusion    Pneumonia due to COVID-19 virus    PUD (peptic ulcer disease)    with bleeding   Rotator cuff disorder    has been evaluated by Dr Wyonia Kersting and [facility]   Stroke (cerebrum) [facility])     Past Surgical History:  Procedure Laterality Date   COLONOSCOPY     EYE SURGERY     right eye removed at age 45   heart stent     x3- 2006     reports that he has quit smoking. His smoking use included cigarettes. He has a 40 pack-year smoking history. He has never used smokeless tobacco. He reports current alcohol use of about 30.0 standard drinks of alcohol per week. He reports that he does not currently use drugs.  No Known Allergies  Family History  Problem Relation Age of Onset   Ovarian cancer Mother    Diabetes Maternal Grandfather    COPD Father    Diabetes Paternal  Grandfather    Colon cancer Paternal Grandfather    Esophageal cancer Neg Hx    Rectal cancer Neg Hx    Stomach cancer Neg Hx     Prior to Admission medications   Medication Sig Start Date End Date Taking? Authorizing Provider  acetaminophen (TYLENOL) 325 MG tablet Take 2 tablets (650 mg total) by mouth every 6 (six) hours as needed for mild pain (or Fever >/= 101). 07/31/22   Arrien, York Ram, MD  albuterol (PROVENTIL) (2.5 MG/3ML) 0.083% nebulizer solution Take 3 mLs (2.5 mg total) by nebulization every 4 (four) hours as needed for wheezing or shortness of breath. 10/29/22   Donita Brooks, MD  apixaban (ELIQUIS) 5 MG TABS tablet Take 1 tablet (5 mg total) by mouth 2 (two) times daily. 10/15/22   Donita Brooks, MD  atorvastatin (LIPITOR) 40 MG tablet Take 1 tablet (40 mg total) by mouth at bedtime. 10/15/22   Donita Brooks, MD  bacitracin ointment Apply 1 Application topically 2 (two) times daily. 02/24/23   Jeannie Fend, PA-C  benzonatate (TESSALON PERLES) 100 MG capsule Take 1 capsule (100 mg total) by mouth 3 (three) times daily as needed for cough. 10/15/22   Donita Brooks, MD  Blood Glucose Calibration (TRUE METRIX LEVEL 1) Low SOLN USE AS DIRECTED. To  monitor fsbs 2 times per day dx e11.9 06/02/21   Donita Brooks, MD  Blood Glucose Monitoring Suppl (TRUE METRIX AIR GLUCOSE METER) DEVI USE AS DIRECTED. To monitor fsbs 2 times per day dx e11.9 06/02/21   Donita Brooks, MD  Budeson-Glycopyrrol-Formoterol (BREZTRI AEROSPHERE) 160-9-4.8 MCG/ACT AERO Inhale 2 puffs into the lungs 2 (two) times daily. 10/15/22   Donita Brooks, MD  Cholecalciferol (D3 PO) Take 1 tablet by mouth daily.    [provider]  clotrimazole-betamethasone (LOTRISONE) cream APPLY TO SKIN 2 TIMES DAILY 10/15/22   Donita Brooks, MD  Cyanocobalamin (VITAMIN B-12 PO) Take 1 tablet by mouth daily.    [provider]  fluticasone (FLONASE) 50 MCG/ACT nasal spray Place 2  sprays into both nostrils daily. 10/15/22   Donita Brooks, MD  glucose blood (RELION TRUE METRIX TEST STRIPS) test strip USE AS DIRECTED. To monitor fsbs 2 times per day dx e11.9 06/02/21   Donita Brooks, MD  isosorbide mononitrate (IMDUR) 30 MG 24 hr tablet Take 0.5 tablets (15 mg total) by mouth daily. 10/15/22   Donita Brooks, MD  magnesium oxide (MAG-OX) 400 (240 Mg) MG tablet Take 1 tablet (400 mg total) by mouth daily. 10/15/22   Donita Brooks, MD  metoprolol tartrate (LOPRESSOR) 50 MG tablet TAKE 1 TABLET BY MOUTH TWICE DAILY 04/13/23   Donita Brooks, MD  Multiple Vitamin (MULTIVITAMIN WITH MINERALS) TABS tablet Take 1 tablet by mouth daily. 10/15/22   Donita Brooks, MD  nitroGLYCERIN (NITROSTAT) 0.4 MG SL tablet DISSOLVE 1 TABLET UNDER THE TONGUE EVERY 5 MINUTES AS NEEDED FOR CHEST PAIN. 10/15/22   Donita Brooks, MD  omeprazole (PRILOSEC) 40 MG capsule Take 1 capsule (40 mg total) by mouth daily. 10/15/22   Donita Brooks, MD  predniSONE (DELTASONE) 10 MG tablet Take 1 tablet (10 mg total) by mouth every morning. 10/15/22   Donita Brooks, MD  QUEtiapine (SEROQUEL) 25 MG tablet Take 1 tablet (25 mg total) by mouth at bedtime. 10/15/22   Donita Brooks, MD    Physical Exam: Vitals:   06/30/23 1611 06/30/23 1612 06/30/23 1614 06/30/23 1800  BP:   115/77 112/67  Pulse:   (!) 125 (!) 109  Resp:   (!) 31 (!) 28  Temp:   98.2 F (36.8 C) 98.2 F (36.8 C)  TempSrc:   Oral Axillary  SpO2: 92%  92% 91%  Weight:  59 kg    Height:  5\' 8"  (1.727 m)      Constitutional: No acute distress, chronically ill appearing Head: Atraumatic Eyes: Conjunctiva clear Neck: Supple Respiratory: scattered rhonchi, no wheeze Cardiovascular: Regular rate and rhythm.  Abdomen: Non-tender, non-distended. No masses. No rebound or guarding.  Musculoskeletal: No joint deformity upper and lower extremities.   Skin: dressed ulcers on lower shins Extremities: warm, no  edema Neurologic: Alert, moving all 4 extremities. Psychiatric: slightly confused   Labs on Admission: I have personally reviewed following labs and imaging studies  CBC: Recent Labs  Lab 06/30/23 1700  WBC 49.7*  HGB 11.9*  HCT 40.4  MCV 85.4  PLT 529*   Basic Metabolic Panel: Recent Labs  Lab 06/30/23 1700  NA 138  K 4.8  CL 98  CO2 29  GLUCOSE 151*  BUN 33*  CREATININE 1.70*  CALCIUM 9.2   GFR: Estimated Creatinine Clearance: 31.8 mL/min (A) (by C-G formula based on SCr of 1.7 mg/dL (H)). Liver Function Tests: No results  for input(s): "AST", "ALT", "ALKPHOS", "BILITOT", "PROT", "ALBUMIN" in the last 168 hours. No results for input(s): "LIPASE", "AMYLASE" in the last 168 hours. No results for input(s): "AMMONIA" in the last 168 hours. Coagulation Profile: No results for input(s): "INR", "PROTIME" in the last 168 hours. Cardiac Enzymes: No results for input(s): "CKTOTAL", "CKMB", "CKMBINDEX", "TROPONINI" in the last 168 hours. BNP (last 3 results) No results for input(s): "PROBNP" in the last 8760 hours. HbA1C: No results for input(s): "HGBA1C" in the last 72 hours. CBG: No results for input(s): "GLUCAP" in the last 168 hours. Lipid Profile: No results for input(s): "CHOL", "HDL", "LDLCALC", "TRIG", "CHOLHDL", "LDLDIRECT" in the last 72 hours. Thyroid Function Tests: No results for input(s): "TSH", "T4TOTAL", "FREET4", "T3FREE", "THYROIDAB" in the last 72 hours. Anemia Panel: No results for input(s): "VITAMINB12", "FOLATE", "FERRITIN", "TIBC", "IRON", "RETICCTPCT" in the last 72 hours. Urine analysis:    Component Value Date/Time   COLORURINE YELLOW 07/27/2022 0843   APPEARANCEUR HAZY (A) 07/27/2022 0843   LABSPEC 1.016 07/27/2022 0843   PHURINE 5.0 07/27/2022 0843   GLUCOSEU NEGATIVE 07/27/2022 0843   HGBUR MODERATE (A) 07/27/2022 0843   BILIRUBINUR NEGATIVE 07/27/2022 0843   KETONESUR NEGATIVE 07/27/2022 0843   PROTEINUR 100 (A) 07/27/2022 0843    NITRITE POSITIVE (A) 07/27/2022 0843   LEUKOCYTESUR MODERATE (A) 07/27/2022 0843    Radiological Exams on Admission: DG Chest Portable 1 View  Result Date: 06/30/2023 CLINICAL DATA:  COPD EXAM: PORTABLE CHEST 1 VIEW COMPARISON:  02/24/2023 FINDINGS: Stable heart size. Hyperlucent lungs. Patchy airspace consolidation at the left lung base. No pleural effusion or pneumothorax. IMPRESSION: Patchy airspace consolidation at the left lung base, suspicious for pneumonia. Electronically Signed   By: Duanne Guess D.O.   On: 06/30/2023 18:30    EKG: Independently reviewed. Sinus tachycardia  Assessment/Plan Active Problems:   COPD (chronic obstructive pulmonary disease) (HCC)   Chronic respiratory failure with hypoxia and hypercapnia (HCC) - 2 L/min at rest, 5 L/min with activity, baseline POC2 50 mmHg   PAF (paroxysmal atrial fibrillation) (HCC)   CAD (coronary artery disease)   History of CVA with residual deficit   TOBACCO ABUSE   S/P coronary artery stent placement   Primary hypertension   # CAP Several days worsening cough and dyspnea. Cxr with LLL consolidation. Also requiring more o2 than his baseline 3 liters - start cefepime - f/u mrsa swab - f/u bnp, trop - f/u hiv, urine antigens, blood cultures, covid, rvp  # Sepsis,  By leukocytosis, tachycardia - f/u lactate, if elevated this will be severe sepsis  # Acute on chronic hypoxic respiratory failure On 3 liters at home here requiring 5. No hypercarbia - continue [place] O2, wean as able  # AKI Cr 1.7 from baseline of around 1, suspect pre-renal - start fluids  # COPD Will treat for exacerbation - cont cefepime - continue methylpred, home prednisone on hold - continue duonebs/albuterol  # a-fib, paroxysmal Appears to be in sinus currently - cont home metop and apixaban  # Venous stasis ulcers - local wound care  # Hx cva - cont home statin and apixaban  # Leukocytosis Likely 2/2 infection - further w/u if  fails to improve with treating underlying infection  # CAD Denies chest pain. Prior stents - f/u trop - cont home statin  # HFpEF - cont home metop and imdur  # Insomnia - home seroquel    DVT prophylaxis: lovenox Code Status: full  Family Communication: none @ bedside  Consults called: none   Level of care:  Med/surg    Silvano Bilis MD [facility] Pager [phone]  If 7PM-7AM, please contact night-coverage [email or web address] Password [facility]  06/30/2023, 6:55 PM

## 2023-07-01 DIAGNOSIS — J9621 Acute and chronic respiratory failure with hypoxia: Secondary | ICD-10-CM

## 2023-07-01 DIAGNOSIS — I2510 Atherosclerotic heart disease of native coronary artery without angina pectoris: Secondary | ICD-10-CM | POA: Diagnosis not present

## 2023-07-01 DIAGNOSIS — J189 Pneumonia, unspecified organism: Secondary | ICD-10-CM

## 2023-07-01 DIAGNOSIS — J449 Chronic obstructive pulmonary disease, unspecified: Secondary | ICD-10-CM | POA: Diagnosis not present

## 2023-07-01 DIAGNOSIS — I10 Essential (primary) hypertension: Secondary | ICD-10-CM

## 2023-07-01 DIAGNOSIS — I480 Paroxysmal atrial fibrillation: Secondary | ICD-10-CM

## 2023-07-01 DIAGNOSIS — Z955 Presence of coronary angioplasty implant and graft: Secondary | ICD-10-CM

## 2023-07-01 DIAGNOSIS — J9612 Chronic respiratory failure with hypercapnia: Secondary | ICD-10-CM

## 2023-07-01 DIAGNOSIS — I6930 Unspecified sequelae of cerebral infarction: Secondary | ICD-10-CM

## 2023-07-01 DIAGNOSIS — R6521 Severe sepsis with septic shock: Secondary | ICD-10-CM

## 2023-07-01 DIAGNOSIS — A419 Sepsis, unspecified organism: Secondary | ICD-10-CM | POA: Diagnosis not present

## 2023-07-01 DIAGNOSIS — F17200 Nicotine dependence, unspecified, uncomplicated: Secondary | ICD-10-CM

## 2023-07-01 DIAGNOSIS — J9611 Chronic respiratory failure with hypoxia: Secondary | ICD-10-CM

## 2023-07-01 LAB — BASIC METABOLIC PANEL
Anion gap: 10 (ref 5–15)
BUN: 48 mg/dL — ABNORMAL HIGH (ref 8–23)
CO2: 26 mmol/L (ref 22–32)
Calcium: 8.5 mg/dL — ABNORMAL LOW (ref 8.9–10.3)
Chloride: 99 mmol/L (ref 98–111)
Creatinine, Ser: 1.91 mg/dL — ABNORMAL HIGH (ref 0.61–1.24)
GFR, Estimated: 36 mL/min — ABNORMAL LOW (ref >60)
Glucose, Bld: 131 mg/dL — ABNORMAL HIGH (ref 70–99)
Potassium: 4.8 mmol/L (ref 3.5–5.1)
Sodium: 135 mmol/L (ref 135–145)

## 2023-07-01 LAB — HIV ANTIBODY (ROUTINE TESTING W REFLEX): HIV Screen 4th Generation wRfx: NONREACTIVE

## 2023-07-01 LAB — CBC
HCT: 35.6 % — ABNORMAL LOW (ref 39.0–52.0)
Hemoglobin: 10.7 g/dL — ABNORMAL LOW (ref 13.0–17.0)
MCH: 25.3 pg — ABNORMAL LOW (ref 26.0–34.0)
MCHC: 30.1 g/dL (ref 30.0–36.0)
MCV: 84.2 fL (ref 80.0–100.0)
Platelets: 475 10*3/uL — ABNORMAL HIGH (ref 150–400)
RBC: 4.23 MIL/uL (ref 4.22–5.81)
RDW: 16.4 % — ABNORMAL HIGH (ref 11.5–15.5)
WBC: 37.3 10*3/uL — ABNORMAL HIGH (ref 4.0–10.5)
nRBC: 0 % (ref 0.0–0.2)

## 2023-07-01 LAB — RESPIRATORY PANEL BY PCR

## 2023-07-01 LAB — GLUCOSE, CAPILLARY: Glucose-Capillary: 158 mg/dL — ABNORMAL HIGH (ref 70–99)

## 2023-07-01 LAB — SARS CORONAVIRUS 2 BY RT PCR: SARS Coronavirus 2 by RT PCR: NEGATIVE

## 2023-07-01 MED ORDER — CEFDINIR 300 MG PO CAPS
300.0000 mg | ORAL_CAPSULE | Freq: Two times a day (BID) | ORAL | Status: DC
  Administered 2023-07-01 (×2): 300 mg via ORAL
  Filled 2023-07-01 (×3): qty 1

## 2023-07-01 MED ORDER — IPRATROPIUM-ALBUTEROL 0.5-2.5 (3) MG/3ML IN SOLN
3.0000 mL | Freq: Three times a day (TID) | RESPIRATORY_TRACT | Status: DC
  Administered 2023-07-02: 3 mL via RESPIRATORY_TRACT
  Filled 2023-07-01: qty 3

## 2023-07-01 MED ORDER — SODIUM CHLORIDE 0.9 % IV BOLUS
250.0000 mL | INTRAVENOUS | Status: AC
  Administered 2023-07-01: 250 mL via INTRAVENOUS

## 2023-07-01 MED ORDER — MIDODRINE HCL 5 MG PO TABS
10.0000 mg | ORAL_TABLET | ORAL | Status: AC
  Administered 2023-07-01: 10 mg via ORAL
  Filled 2023-07-01: qty 2

## 2023-07-01 MED ORDER — DOXYCYCLINE HYCLATE 100 MG PO TABS
100.0000 mg | ORAL_TABLET | Freq: Two times a day (BID) | ORAL | Status: DC
  Administered 2023-07-01 – 2023-07-05 (×8): 100 mg via ORAL
  Filled 2023-07-01 (×8): qty 1

## 2023-07-01 MED ORDER — CEFUROXIME AXETIL 250 MG PO TABS
250.0000 mg | ORAL_TABLET | Freq: Two times a day (BID) | ORAL | Status: DC
  Filled 2023-07-01: qty 1

## 2023-07-01 NOTE — Progress Notes (Signed)
Received a call from bedside RN regarding the patient being hypotensive with MAP in the 50s and hypersomnolent.  NS bolus 250 cc x 1 and midodrine 10 mg x 1 ordered to be administered.  Presented at bedside.  Rapid response team also present at bedside.  The patient remains hypotensive and hypersomnolent.  Responsive to painful stimuli.  NS bolus 250 cc x 1 and midodrine 10 mg x 1 repeated.  To note, the patient took his scheduled Seroquel 25 mg nightly last night.  Moved to progressive care unit for close monitoring.  Time: 15 minutes.

## 2023-07-01 NOTE — Significant Event (Addendum)
Rapid Response Event Note   Reason for Call :  BP-67/31, lethargy  Pt's BP was 74/45 at 2326. At this time he was given 10mg  midodrine and 250cc NS with no change in BP.   Pt was given 50mg  metoprolol po and 25mg  seroquel po at 2110.  Initial Focused Assessment:  Pt lying in bed with eyes closed, in no visible distress. Pt will withdraw to pain in all extremities and say "leave me alone" with repeated painful stimulation. Pt will not open eyes or answer questions. Lungs diminished t/o. Skin hot to touch.  T-97.8(R), HR-84, 70/46, RR-20, SpO2-98% on 3L [place]  Interventions:  CBG-158 250cc NS bolus  10mg  midodrine po Tx to PCU Plan of Care:  BP improving with fluids. Tx to 4E15 for closer monitoring.  Please call RRT if further assistance needed.  Event Summary:   MD Notified: Dr. Margo Aye at bedside on RRT arrival Call Time:0029 Arrival Time:0034 End Time:0150  Terrilyn Saver, RN

## 2023-07-01 NOTE — Progress Notes (Signed)
   06/30/23 2326  Assess: MEWS Score  Temp 98.5 F (36.9 C)  BP (!) 74/45  MAP (mmHg) (!) 56  Pulse Rate 83  Resp 18  SpO2 98 %  O2 Device Nasal Cannula  Assess: MEWS Score  MEWS Temp 0  MEWS Systolic 2  MEWS Pulse 0  MEWS RR 0  MEWS LOC 0  MEWS Score 2  MEWS Score Color Yellow  Assess: if the MEWS score is Yellow or Red  Were vital signs accurate and taken at a resting state? Yes  Does the patient meet 2 or more of the SIRS criteria? No  MEWS guidelines implemented  Yes, yellow  Treat  MEWS Interventions Considered administering scheduled or prn medications/treatments as ordered  Take Vital Signs  Increase Vital Sign Frequency  Yellow: Q2hr x1, continue Q4hrs until patient remains green for 12hrs  Escalate  MEWS: Escalate Yellow: Discuss with charge nurse and consider notifying provider and/or RRT  Notify: Charge Nurse/RN  Name of Charge Nurse/RN Notified [place], RN  Provider Notification  Provider Name/Title Delma Post, MD  Date Provider Notified 06/30/23  Time Provider Notified 2338  Method of Notification Page  Notification Reason Change in status  Provider response See new orders  Date of Provider Response 06/30/23  Time of Provider Response 2340  Assess: SIRS CRITERIA  SIRS Temperature  0  SIRS Pulse 0  SIRS Respirations  0  SIRS WBC 0  SIRS Score Sum  0

## 2023-07-01 NOTE — Progress Notes (Signed)
Received pt. To bed 15, completed skin assessment, vital signs and notified telemetry. Patient  somnolent accompanied by wife who gives some history. Will continue to monitor for hypotension.

## 2023-07-01 NOTE — Progress Notes (Addendum)
PROGRESS NOTE    Zachary Chambers  ZOX:[id number] DOB: 05/11/1951 DOA: 06/30/2023 PCP: Donita Brooks, MD   Brief Narrative:  Zachary Chambers is a 72 y.o. male with medical history significant for advanced copd, cad, chronic hypoxic respiratory failure on 3 L nasal cannula around-the-clock, CVA, A-fib paroxysmal, hypertension who presents with worsening dyspnea cough initiating about a week prior to admission worsening over the past few days.  Hospitalist called for admission given worsening hypoxia from baseline with presumed pneumonia based on imaging.  Assessment & Plan:   Principal Problem:   CAP (community acquired pneumonia) Active Problems:   COPD (chronic obstructive pulmonary disease) (HCC)   Chronic respiratory failure with hypoxia and hypercapnia (HCC) - 2 L/min at rest, 5 L/min with activity, baseline POC2 50 mmHg   PAF (paroxysmal atrial fibrillation) (HCC)   CAD (coronary artery disease)   History of CVA with residual deficit   TOBACCO ABUSE   S/P coronary artery stent placement   Primary hypertension   Septic shock, resolving  Community-acquired pneumonia  -Patient profoundly hypotensive at intake, not initially responsive to fluids requiring midodrine to maintain MAP greater than 65, blood pressure now improving with ongoing fluid resuscitation, wean midodrine as appropriate (home metoprolol and Imdur held) -Respiratory status improving, remains on 4-5L nasal cannula to maintain sats greater than 92% (baseline 3 L nasal cannula at home) -Continue cefdinir, doxycycline, steroid taper, nebs -Respiratory viral panel and nasal swab pending contact precautions can be removed once these report negative   Acute on chronic hypoxic respiratory failure Baseline: 3 L nasal cannula around-the-clock  -Initially requiring 5 L nasal cannula to maintain sats above 92%, stable   AKI without history of CKD Likely prerenal in the setting of poor p.o. intake and acute illness as well as  profound hypotension  Creatinine continues to increase in the setting of shock, continue IV fluids until urine output improves   COPD, cannot rule out acute exacerbation -Continue nebs, steroid taper  -Transition back to home prednisone in next 3 to 5 days pending clinical status  Paroxysmal A-fib, rate controlled Continue apixaban, rate controlled currently in sinus rhythm Metoprolol held given hypotension   Venous stasis ulcers, chronic - local wound care ongoing   CVA, history of - cont home statin and apixaban (no concurrent aspirin or Plavix due to bleed risk)   CAD, history of -Asymptomatic, continue home statin   HFpEF without acute exacerbation -Hypovolemic, continue IV fluids judiciously, monitor for volume overload -Hold home metop and imdur   Insomnia -Continue home seroquel  DVT prophylaxis: apixaban (ELIQUIS) tablet 2.5 mg Start: 06/30/23 2200 apixaban (ELIQUIS) tablet 2.5 mg  Code Status:   Code Status: Full Code Family Communication: None present  Status is: Inpatient  Dispo: The patient is from: Home              Anticipated d/c is to: Home              Anticipated d/c date is: 48-72 hours              Patient currently not medically stable for discharge  Consultants:  None  Procedures:  None  Antimicrobials:  Cefdinir, doxycycline  Subjective: No acute issues or events overnight, respiratory status improving somewhat, no longer hypotensive.  Denies nausea vomiting diarrhea constipation headache fevers chills or chest pain.  Objective: Vitals:   07/01/23 0107 07/01/23 0119 07/01/23 0135 07/01/23 0234  BP: (!) 71/45 (!) 90/57 (!) 76/41 92/62  Pulse:  76  Resp: 18 (!) 22 19 19   Temp:  97.8 F (36.6 C)  (!) 97.5 F (36.4 C)  TempSrc:  Rectal  Axillary  SpO2:    97%  Weight:      Height:        Intake/Output Summary (Last 24 hours) at 07/01/2023 0734 Last data filed at 07/01/2023 0300 Gross per 24 hour  Intake 811 ml  Output 0 ml   Net 811 ml   Filed Weights   06/30/23 1612  Weight: 59 kg    Examination:  General:  Pleasantly resting in bed, No acute distress. HEENT:  Normocephalic atraumatic.  Sclerae nonicteric, noninjected.  Extraocular movements intact bilaterally. Neck:  Without mass or deformity.  Trachea is midline. Lungs: Scant rhonchi left greater than right without overt wheeze or rales. Heart:  Regular rate and rhythm.  Without murmurs, rubs, or gallops. Abdomen:  Soft, nontender, nondistended.  Without guarding or rebound. Extremities: Without cyanosis, clubbing, edema, or obvious deformity. Skin: Bilateral lower extremity ulcers anterior lower extremity -bandages clean dry intact.  Otherwise warm and dry, no erythema.  Data Reviewed: I have personally reviewed following labs and imaging studies  CBC: Recent Labs  Lab 06/30/23 1700 07/01/23 0521  WBC 49.7* 37.3*  NEUTROABS 43.3*  --   HGB 11.9* 10.7*  HCT 40.4 35.6*  MCV 85.4 84.2  PLT 529* 475*   Basic Metabolic Panel: Recent Labs  Lab 06/30/23 1700 07/01/23 0521  NA 138 135  K 4.8 4.8  CL 98 99  CO2 29 26  GLUCOSE 151* 131*  BUN 33* 48*  CREATININE 1.70* 1.91*  CALCIUM 9.2 8.5*   GFR: Estimated Creatinine Clearance: 28.3 mL/min (A) (by C-G formula based on SCr of 1.91 mg/dL (H)).  CBG: Recent Labs  Lab 07/01/23 0038  GLUCAP 158*   Sepsis Labs: Recent Labs  Lab 06/30/23 1830 06/30/23 2033  LATICACIDVEN 1.9 2.2*    Recent Results (from the past 240 hour(s))  Culture, blood (routine x 2)     Status: None (Preliminary result)   Collection Time: 06/30/23  6:00 PM   Specimen: BLOOD  Result Value Ref Range Status   Specimen Description BLOOD RIGHT ANTECUBITAL  Final   Special Requests   Final    Blood Culture results may not be optimal due to an excessive volume of blood received in culture bottles   Culture   Final    NO GROWTH < 12 HOURS Performed at [facility], [address], [place], [place]  [address]    Report Status PENDING  Incomplete  Culture, blood (routine x 2)     Status: None (Preliminary result)   Collection Time: 06/30/23  8:31 PM   Specimen: BLOOD  Result Value Ref Range Status   Specimen Description BLOOD SITE NOT SPECIFIED  Final   Special Requests   Final    BOTTLES DRAWN AEROBIC AND ANAEROBIC Blood Culture adequate volume   Culture   Final    NO GROWTH < 12 HOURS Performed at [facility], [address] [place], [place] [address]    Report Status PENDING  Incomplete    Radiology Studies: DG Chest Portable 1 View  Result Date: 06/30/2023 CLINICAL DATA:  COPD EXAM: PORTABLE CHEST 1 VIEW COMPARISON:  02/24/2023 FINDINGS: Stable heart size. Hyperlucent lungs. Patchy airspace consolidation at the left lung base. No pleural effusion or pneumothorax. IMPRESSION: Patchy airspace consolidation at the left lung base, suspicious for pneumonia. Electronically Signed  By: Duanne Guess D.O.   On: 06/30/2023 18:30    Scheduled Meds:  apixaban  2.5 mg Oral BID   atorvastatin  40 mg Oral QHS   ipratropium-albuterol  3 mL Nebulization Q6H   isosorbide mononitrate  15 mg Oral Daily   methylPREDNISolone (SOLU-MEDROL) injection  40 mg Intravenous Q12H   metoprolol tartrate  50 mg Oral BID   QUEtiapine  25 mg Oral QHS   Continuous Infusions:  ceFEPime (MAXIPIME) IV 2 g (06/30/23 2109)   dextrose 5 % and 0.45 % NaCl 100 mL/hr at 06/30/23 2152    LOS: 1 day   Time spent:  Azucena Fallen, DO [facility]  If 7PM-7AM, please contact night-coverage [email or web address]  07/01/2023, 7:34 AM

## 2023-07-01 NOTE — Discharge Instructions (Signed)

## 2023-07-01 NOTE — Progress Notes (Addendum)
Ok to change cefepime to doxycycline 100mg  BID  and cefdinir 300mg  PO BID per Dr. Natale Milch.  Scr up 1.91 but expect to trend down  Ulyses Southward, PharmD, BCIDP, AAHIVP, CPP Infectious Disease Pharmacist 07/01/2023 9:50 AM

## 2023-07-01 NOTE — Progress Notes (Signed)
Patient new admit from the ED came with CAP. Wife by his side. Upon arrival his lactic acid was 1.9. At 2120 critical lab value received lactic acid 2.2. Pt BP  on arrival was 111/66. At 2325 BP dropped to 74/45. Dr Delma Post notified and ordered 10 mg of Midodrine and 250 cc bolus of NS. Same orders was re administered because BP 70/46. As of 0107 BP 71/45. Rapid Response notified. Dr Margo Aye ordered transfer to progressive unit. Pt assigned to 4 East room 15. Rapport given to Garrette Caine, Charity fundraiser. Pt transferred BP is 90/57

## 2023-07-02 DIAGNOSIS — A419 Sepsis, unspecified organism: Secondary | ICD-10-CM | POA: Diagnosis not present

## 2023-07-02 DIAGNOSIS — L8990 Pressure ulcer of unspecified site, unspecified stage: Secondary | ICD-10-CM | POA: Insufficient documentation

## 2023-07-02 DIAGNOSIS — I2510 Atherosclerotic heart disease of native coronary artery without angina pectoris: Secondary | ICD-10-CM | POA: Diagnosis not present

## 2023-07-02 DIAGNOSIS — I6930 Unspecified sequelae of cerebral infarction: Secondary | ICD-10-CM | POA: Diagnosis not present

## 2023-07-02 DIAGNOSIS — J189 Pneumonia, unspecified organism: Secondary | ICD-10-CM | POA: Diagnosis not present

## 2023-07-02 DIAGNOSIS — R6521 Severe sepsis with septic shock: Secondary | ICD-10-CM

## 2023-07-02 LAB — COMPREHENSIVE METABOLIC PANEL
ALT: 18 U/L (ref 0–44)
AST: 41 U/L (ref 15–41)
Albumin: 2.6 g/dL — ABNORMAL LOW (ref 3.5–5.0)
Alkaline Phosphatase: 116 U/L (ref 38–126)
Anion gap: 12 (ref 5–15)
BUN: 62 mg/dL — ABNORMAL HIGH (ref 8–23)
CO2: 24 mmol/L (ref 22–32)
Calcium: 9 mg/dL (ref 8.9–10.3)
Chloride: 101 mmol/L (ref 98–111)
Creatinine, Ser: 2.71 mg/dL — ABNORMAL HIGH (ref 0.61–1.24)
GFR, Estimated: 24 mL/min — ABNORMAL LOW (ref >60)
Glucose, Bld: 182 mg/dL — ABNORMAL HIGH (ref 70–99)
Potassium: 4.6 mmol/L (ref 3.5–5.1)
Sodium: 137 mmol/L (ref 135–145)
Total Bilirubin: 0.4 mg/dL (ref <1.2)
Total Protein: 6.4 g/dL — ABNORMAL LOW (ref 6.5–8.1)

## 2023-07-02 LAB — CBC
HCT: 36.1 % — ABNORMAL LOW (ref 39.0–52.0)
Hemoglobin: 10.9 g/dL — ABNORMAL LOW (ref 13.0–17.0)
MCH: 25.1 pg — ABNORMAL LOW (ref 26.0–34.0)
MCHC: 30.2 g/dL (ref 30.0–36.0)
MCV: 83.2 fL (ref 80.0–100.0)
Platelets: 581 10*3/uL — ABNORMAL HIGH (ref 150–400)
RBC: 4.34 MIL/uL (ref 4.22–5.81)
RDW: 16.7 % — ABNORMAL HIGH (ref 11.5–15.5)
WBC: 35.2 10*3/uL — ABNORMAL HIGH (ref 4.0–10.5)
nRBC: 0.1 % (ref 0.0–0.2)

## 2023-07-02 MED ORDER — PREDNISONE 5 MG PO TABS
10.0000 mg | ORAL_TABLET | Freq: Two times a day (BID) | ORAL | Status: DC
  Administered 2023-07-04: 10 mg via ORAL
  Filled 2023-07-02 (×2): qty 1

## 2023-07-02 MED ORDER — CEFDINIR 300 MG PO CAPS
300.0000 mg | ORAL_CAPSULE | Freq: Every day | ORAL | Status: DC
  Administered 2023-07-02 – 2023-07-05 (×4): 300 mg via ORAL
  Filled 2023-07-02 (×4): qty 1

## 2023-07-02 MED ORDER — PREDNISONE 10 MG PO TABS
20.0000 mg | ORAL_TABLET | Freq: Two times a day (BID) | ORAL | Status: AC
  Administered 2023-07-02 – 2023-07-03 (×3): 20 mg via ORAL
  Filled 2023-07-02 (×3): qty 2

## 2023-07-02 MED ORDER — IPRATROPIUM-ALBUTEROL 0.5-2.5 (3) MG/3ML IN SOLN
3.0000 mL | Freq: Two times a day (BID) | RESPIRATORY_TRACT | Status: DC
  Administered 2023-07-02: 3 mL via RESPIRATORY_TRACT
  Filled 2023-07-02: qty 3

## 2023-07-02 MED ORDER — SODIUM CHLORIDE 0.9 % IV SOLN: Freq: Once | INTRAVENOUS | Status: AC

## 2023-07-02 NOTE — Plan of Care (Signed)
Patient alert/oriented X3. Patient does not recall month of year. Patient VSS upon admission and hooked up to new external urinary catheter. Patient remains on 3L nasal cannula and compliant with medication administration. Skin assessed with Elisa, RN, wounds noted on B/L legs and breakdown noted on sacrum. Sacral foam patch applied to buttocks and gauze wraps to wounds on legs. No complaints of pain noted. Will continue to monitor.  Problem: Education: Goal: Knowledge of General Education information will improve Description: Including pain rating scale, medication(s)/side effects and non-pharmacologic comfort measures Outcome: Progressing   Problem: Health Behavior/Discharge Planning: Goal: Ability to manage health-related needs will improve Outcome: Progressing   Problem: Clinical Measurements: Goal: Ability to maintain clinical measurements within normal limits will improve Outcome: Progressing   Problem: Clinical Measurements: Goal: Will remain free from infection Outcome: Progressing   Problem: Clinical Measurements: Goal: Diagnostic test results will improve Outcome: Progressing   Problem: Clinical Measurements: Goal: Respiratory complications will improve Outcome: Progressing   Problem: Clinical Measurements: Goal: Cardiovascular complication will be avoided Outcome: Progressing   Problem: Activity: Goal: Risk for activity intolerance will decrease Outcome: Progressing   Problem: Nutrition: Goal: Adequate nutrition will be maintained Outcome: Progressing   Problem: Coping: Goal: Level of anxiety will decrease Outcome: Progressing   Problem: Elimination: Goal: Will not experience complications related to bowel motility Outcome: Progressing   Problem: Elimination: Goal: Will not experience complications related to urinary retention Outcome: Progressing   Problem: Pain Management: Goal: General experience of comfort will improve Outcome: Progressing    Problem: Safety: Goal: Ability to remain free from injury will improve Outcome: Progressing   Problem: Skin Integrity: Goal: Risk for impaired skin integrity will decrease Outcome: Progressing

## 2023-07-02 NOTE — Progress Notes (Addendum)
PROGRESS NOTE    Zachary Chambers  YQM:[id number] DOB: Nov 12, 1950 DOA: 06/27/2023 PCP: Zachary Brooks, MD   Brief Narrative:  Zachary Chambers is a 72 y.o. male with medical history significant for advanced copd, cad, chronic hypoxic respiratory failure on 3 L nasal cannula around-the-clock, CVA, A-fib paroxysmal, hypertension who presents with worsening dyspnea cough initiating about a week prior to admission worsening over the past few days.  Hospitalist called for admission given worsening hypoxia from baseline with presumed pneumonia based on imaging.  Assessment & Plan:   Principal Problem:   CAP (community acquired pneumonia) Active Problems:   COPD (chronic obstructive pulmonary disease) (HCC)   Chronic respiratory failure with hypoxia and hypercapnia (HCC) - 2 L/min at rest, 5 L/min with activity, baseline POC2 50 mmHg   PAF (paroxysmal atrial fibrillation) (HCC)   CAD (coronary artery disease)   History of CVA with residual deficit   TOBACCO ABUSE   S/P coronary artery stent placement   Primary hypertension   Septic shock, resolving  Community-acquired pneumonia  Lactic acidosis, POA, resolving -Patient profoundly hypotensive at intake, not initially responsive to fluids requiring midodrine to maintain MAP greater than 65, blood pressure now improving with ongoing fluid resuscitation, midodrine off  - Home metoprolol and Imdur remain on hold -Respiratory status improving, remains on 4-5L nasal cannula to maintain sats greater than 92% (baseline 3 L nasal cannula at home) -Continue cefdinir, doxycycline, steroid taper, nebs -Respiratory viral panel and nasal swab negative, discontinue contact precautions   Acute on chronic hypoxic respiratory failure Baseline: 3 L nasal cannula around-the-clock  -Initially requiring 5 L nasal cannula to maintain sats above 92%, stable   AKI without history of CKD, worsening Likely prerenal in the setting of poor p.o. intake and acute illness  as well as profound hypotension  Creatinine continues to increase in the setting of shock, continue to bolus IV fluids until urine output improves   COPD, cannot rule out acute exacerbation -Continue nebs, steroid taper ongoing -Transition back to home prednisone 10mg  daily  Paroxysmal A-fib, rate controlled Continue apixaban, rate controlled currently in sinus rhythm Metoprolol held given hypotension   Venous stasis ulcers, chronic - local wound care ongoing   CVA, history of - cont home statin and apixaban (no concurrent aspirin or Plavix due to bleed risk)   CAD, history of -Asymptomatic, continue home statin   HFpEF without acute exacerbation -Hypovolemic, continue IV fluids judiciously, monitor for volume overload -Hold home metop and imdur   Insomnia -Continue home seroquel  DVT prophylaxis: apixaban (ELIQUIS) tablet 2.5 mg Start: 07/06/2023 2200 apixaban (ELIQUIS) tablet 2.5 mg  Code Status:   Code Status: Full Code Family Communication: None present  Status is: Inpatient  Dispo: The patient is from: Home              Anticipated d/c is to: Home              Anticipated d/c date is: 48-72 hours              Patient currently not medically stable for discharge  Consultants:  None  Procedures:  None  Antimicrobials:  Cefdinir, doxycycline  Subjective: No acute issues or events overnight, respiratory status minimally improving, urine output appears to have decreased but otherwise feels much improved from prior.  Denies nausea vomiting diarrhea constipation headache fevers chills or chest pain  Objective: Vitals:   07/01/23 2035 07/01/23 2337 07/02/23 0336 07/02/23 0448  BP:  (!) 97/56 103/63  Pulse:  81 88   Resp:  14 17   Temp:  97.6 F (36.4 C) 97.7 F (36.5 C)   TempSrc:  Oral Oral   SpO2: 96% 94% 96% 95%  Weight:      Height:        Intake/Output Summary (Last 24 hours) at 07/02/2023 0726 Last data filed at 07/02/2023 5621 Gross per 24 hour   Intake 480 ml  Output 150 ml  Net 330 ml   Filed Weights   07/02/2023 1612  Weight: 59 kg    Examination:  General:  Pleasantly resting in bed, No acute distress. HEENT:  Normocephalic atraumatic.  Sclerae nonicteric, noninjected.  Extraocular movements intact bilaterally. Neck:  Without mass or deformity.  Trachea is midline. Lungs: Scant rhonchi left greater than right without overt wheeze or rales. Heart:  Regular rate and rhythm.  Without murmurs, rubs, or gallops. Abdomen:  Soft, nontender, nondistended.  Without guarding or rebound. Extremities: Without cyanosis, clubbing, edema, or obvious deformity. Skin: Bilateral lower extremity ulcers anterior lower extremity -bandages clean dry intact.  Otherwise warm and dry, no erythema.  Data Reviewed: I have personally reviewed following labs and imaging studies  CBC: Recent Labs  Lab 06/20/2023 1700 07/01/23 0521 07/02/23 0446  WBC 49.7* 37.3* 35.2*  NEUTROABS 43.3*  --   --   HGB 11.9* 10.7* 10.9*  HCT 40.4 35.6* 36.1*  MCV 85.4 84.2 83.2  PLT 529* 475* 581*   Basic Metabolic Panel: Recent Labs  Lab 06/21/2023 1700 07/01/23 0521 07/02/23 0446  NA 138 135 137  K 4.8 4.8 4.6  CL 98 99 101  CO2 29 26 24   GLUCOSE 151* 131* 182*  BUN 33* 48* 62*  CREATININE 1.70* 1.91* 2.71*  CALCIUM 9.2 8.5* 9.0   GFR: Estimated Creatinine Clearance: 20 mL/min (A) (by C-G formula based on SCr of 2.71 mg/dL (H)).  CBG: Recent Labs  Lab 07/01/23 0038  GLUCAP 158*   Sepsis Labs: Recent Labs  Lab 07/16/2023 1830 07/10/2023 2033  LATICACIDVEN 1.9 2.2*    Recent Results (from the past 240 hour(s))  Culture, blood (routine x 2)     Status: None (Preliminary result)   Collection Time: 07/14/2023  6:00 PM   Specimen: BLOOD  Result Value Ref Range Status   Specimen Description BLOOD RIGHT ANTECUBITAL  Final   Special Requests   Final    Blood Culture results may not be optimal due to an excessive volume of blood received in culture  bottles   Culture   Final    NO GROWTH 2 DAYS Performed at [facility], [address] [place], [place] [address]    Report Status PENDING  Incomplete  Respiratory (~20 pathogens) panel by PCR     Status: None   Collection Time: 07/08/2023  7:06 PM   Specimen: Nasopharyngeal Swab; Respiratory  Result Value Ref Range Status   Adenovirus NOT DETECTED NOT DETECTED Final   Coronavirus 229E NOT DETECTED NOT DETECTED Final    Comment: (NOTE) The Coronavirus on the Respiratory Panel, DOES NOT test for the novel  Coronavirus (2019 nCoV)    Coronavirus HKU1 NOT DETECTED NOT DETECTED Final   Coronavirus NL63 NOT DETECTED NOT DETECTED Final   Coronavirus OC43 NOT DETECTED NOT DETECTED Final   Metapneumovirus NOT DETECTED NOT DETECTED Final   Rhinovirus / Enterovirus NOT DETECTED NOT DETECTED Final   Influenza A NOT DETECTED NOT DETECTED Final   Influenza B NOT DETECTED NOT DETECTED Final  Parainfluenza Virus 1 NOT DETECTED NOT DETECTED Final   Parainfluenza Virus 2 NOT DETECTED NOT DETECTED Final   Parainfluenza Virus 3 NOT DETECTED NOT DETECTED Final   Parainfluenza Virus 4 NOT DETECTED NOT DETECTED Final   Respiratory Syncytial Virus NOT DETECTED NOT DETECTED Final   Bordetella pertussis NOT DETECTED NOT DETECTED Final   Bordetella Parapertussis NOT DETECTED NOT DETECTED Final   Chlamydophila pneumoniae NOT DETECTED NOT DETECTED Final   Mycoplasma pneumoniae NOT DETECTED NOT DETECTED Final    Comment: Performed at [facility], [address], [place], [place] [address]  Culture, blood (routine x 2)     Status: None (Preliminary result)   Collection Time: 07/14/2023  8:31 PM   Specimen: BLOOD  Result Value Ref Range Status   Specimen Description BLOOD SITE NOT SPECIFIED  Final   Special Requests   Final    BOTTLES DRAWN AEROBIC AND ANAEROBIC Blood Culture adequate volume   Culture   Final    NO GROWTH 2 DAYS Performed at [facility], [address],  [place], [place] [address]    Report Status PENDING  Incomplete  SARS Coronavirus 2 by RT PCR (hospital order, performed in [facility] hospital lab) *cepheid single result test* Anterior Nasal Swab     Status: None   Collection Time: 07/01/23  1:39 PM   Specimen: Anterior Nasal Swab  Result Value Ref Range Status   SARS Coronavirus 2 by RT PCR NEGATIVE NEGATIVE Final    Comment: Performed at [facility], [address], [place], [place] [address]    Radiology Studies: DG Chest Portable 1 View  Result Date: 06/22/2023 CLINICAL DATA:  COPD EXAM: PORTABLE CHEST 1 VIEW COMPARISON:  02/24/2023 FINDINGS: Stable heart size. Hyperlucent lungs. Patchy airspace consolidation at the left lung base. No pleural effusion or pneumothorax. IMPRESSION: Patchy airspace consolidation at the left lung base, suspicious for pneumonia. Electronically Signed   By: Duanne Guess D.O.   On: 07/15/2023 18:30    Scheduled Meds:  apixaban  2.5 mg Oral BID   atorvastatin  40 mg Oral QHS   cefdinir  300 mg Oral Q12H   doxycycline  100 mg Oral Q12H   ipratropium-albuterol  3 mL Nebulization TID   methylPREDNISolone (SOLU-MEDROL) injection  40 mg Intravenous Q12H   QUEtiapine  25 mg Oral QHS   Continuous Infusions:    LOS: 2 days   Time spent:  Azucena Fallen, DO [facility]  If 7PM-7AM, please contact night-coverage [email or web address]  07/02/2023, 7:26 AM

## 2023-07-02 NOTE — Progress Notes (Signed)
Pt LOC downgraded to med-surg, bed assigned to 5C12.  Report called to receiving RN.  Attempted to call Pts wife but voicemail box was full.

## 2023-07-02 NOTE — Evaluation (Signed)
Occupational Therapy Evaluation Patient Details Name: Zachary Chambers MRN: [id number] DOB: 1951-06-27 Today's Date: 07/02/2023   History of Present Illness Pt is 72 year old presented to [facility] on  06/27/2023 for SOB. Pt with septic shock due to PNA.  PMH - COPD with chronic respiratory failure on 3L oxygen, PAF, CAD, CVA, depression and anxiety, cognitive impairment, heavy alcohol use, chf.   Clinical Impression   Patient admitted for the diagnosis above.  PTA he lives at home with his SO, who provides assist as needed.  Patient fatigues very quickly and limits ultimate functional status.  OT will continue efforts in the acute setting and Patient will benefit from continued inpatient follow up therapy, <3 hours/day        If plan is discharge home, recommend the following: A lot of help with bathing/dressing/bathroom;A lot of help with walking and/or transfers;Assist for transportation;Help with stairs or ramp for entrance    Functional Status Assessment  Patient has had a recent decline in their functional status and demonstrates the ability to make significant improvements in function in a reasonable and predictable amount of time.  Equipment Recommendations  None recommended by OT    Recommendations for Other Services       Precautions / Restrictions Precautions Precautions: Fall;Other (comment) Precaution Comments: watch SpO2 Restrictions Weight Bearing Restrictions: No      Mobility Bed Mobility Overal bed mobility: Needs Assistance Bed Mobility: Sit to Supine       Sit to supine: Min assist     Patient Response: Cooperative  Transfers Overall transfer level: Needs assistance Equipment used: 2 person hand held assist Transfers: Sit to/from Stand, Bed to chair/wheelchair/BSC Sit to Stand: +2 physical assistance, Mod assist     Step pivot transfers: +2 physical assistance, Mod assist            Balance Overall balance assessment: Needs  assistance Sitting-balance support: No upper extremity supported, Feet supported Sitting balance-Leahy Scale: Fair     Standing balance support: Bilateral upper extremity supported, During functional activity Standing balance-Leahy Scale: Poor                             ADL either performed or assessed with clinical judgement   ADL   Eating/Feeding: Bed level;Set up   Grooming: Wash/dry hands;Wash/dry face;Set up;Bed level   Upper Body Bathing: Moderate assistance;Sitting       Upper Body Dressing : Moderate assistance;Sitting   Lower Body Dressing: Moderate assistance;Sitting/lateral leans   Toilet Transfer: Moderate assistance;Stand-pivot;BSC/3in1                   Vision Patient Visual Report: No change from baseline       Perception Perception: Not tested       Praxis Praxis: Not tested       Pertinent Vitals/Pain Pain Assessment Pain Assessment: Faces Faces Pain Scale: Hurts a little bit Pain Location: back Pain Descriptors / Indicators: Aching Pain Intervention(s): Monitored during session     Extremity/Trunk Assessment Upper Extremity Assessment Upper Extremity Assessment: Generalized weakness   Lower Extremity Assessment Lower Extremity Assessment: Defer to PT evaluation   Cervical / Trunk Assessment Cervical / Trunk Assessment: Kyphotic   Communication Communication Communication: Difficulty communicating thoughts/reduced clarity of speech   Cognition Arousal: Alert Behavior During Therapy: Flat affect Overall Cognitive Status: No family/caregiver present to determine baseline cognitive functioning  General Comments: following commands, mumbles     General Comments  VSS on supplemental O2    Exercises     Shoulder Instructions      Home Living Family/patient expects to be discharged to:: Private residence Living Arrangements: Spouse/significant other Available Help  at Discharge: Family;Available PRN/intermittently Type of Home: House Home Access: Stairs to enter Entrance Stairs-Number of Steps: Pt has ramped entrance w/ 1 STE Entrance Stairs-Rails: Right Home Layout: Two level;Able to live on main level with bedroom/bathroom Alternate Level Stairs-Number of Steps: 14 Alternate Level Stairs-Rails: Right;Left;Can reach both Bathroom Shower/Tub: Chief Strategy Officer: Standard Bathroom Accessibility: Yes   Home Equipment: Agricultural consultant (2 wheels);Cane - single point;Shower seat;Hand held shower head;Grab bars - toilet;Grab bars - tub/shower   Additional Comments: takes sponge bathes PRN      Prior Functioning/Environment Prior Level of Function : Needs assist;History of Falls (last six months);Patient poor historian/Family not available       Physical Assist : ADLs (physical)     Mobility Comments: pt reports he doesn't use assistive device because it didn't work too good at home ADLs Comments: Reports his girlfriend helps him much of the time due to it takes him too long to do ADL        OT Problem List: Decreased strength;Decreased activity tolerance;Impaired balance (sitting and/or standing)      OT Treatment/Interventions: Self-care/ADL training;Therapeutic activities;DME and/or AE instruction;Balance training;Patient/family education;Energy conservation    OT Goals(Current goals can be found in the care plan section) Acute Rehab OT Goals Patient Stated Goal: Return home OT Goal Formulation: With patient Time For Goal Achievement: 07/16/23 Potential to Achieve Goals: Fair ADL Goals Pt Will Perform Grooming: with set-up;sitting Pt Will Perform Upper Body Bathing: with set-up;sitting Pt Will Perform Upper Body Dressing: with set-up;sitting Pt Will Perform Lower Body Dressing: with min assist;sit to/from stand Pt Will Transfer to Toilet: with supervision;stand pivot transfer;bedside commode Pt/caregiver will Perform  Home Exercise Program: Increased strength;Both right and left upper extremity;With theraband;With written HEP provided  OT Frequency: Min 1X/week    Co-evaluation              AM-PAC OT "6 Clicks" Daily Activity     Outcome Measure Help from another person eating meals?: A Little Help from another person taking care of personal grooming?: A Little Help from another person toileting, which includes using toliet, bedpan, or urinal?: A Lot Help from another person bathing (including washing, rinsing, drying)?: A Lot Help from another person to put on and taking off regular upper body clothing?: A Little Help from another person to put on and taking off regular lower body clothing?: A Lot 6 Click Score: 15   End of Session Nurse Communication: Mobility status  Activity Tolerance: Patient tolerated treatment well Patient left: in bed;with call bell/phone within reach;with bed alarm set  OT Visit Diagnosis: Unsteadiness on feet (R26.81);Muscle weakness (generalized) (M62.81)                Time: 4332-9518 OT Time Calculation (min): 17 min Charges:  OT General Charges $OT Visit: 1 Visit OT Evaluation $OT Eval Moderate Complexity: 1 Mod  07/02/2023  RP, OTR/L  Acute Rehabilitation Services  Office:  [phone]   Suzanna Obey 07/02/2023, 3:28 PM

## 2023-07-02 NOTE — Care Management Important Message (Signed)
Important Message  Patient Details  Name: Zachary Chambers MRN: [id number] Date of Birth: January 24, 1951   Important Message Given:  Yes - Medicare IM     Sherilyn Banker 07/02/2023, 4:39 PM

## 2023-07-02 NOTE — Evaluation (Signed)
Physical Therapy Evaluation Patient Details Name: Zachary Chambers MRN: [id number] DOB: 1950/10/02 Today's Date: 07/02/2023  History of Present Illness  Pt is 72 year old presented to [facility] on  07/08/2023 for SOB. Pt with septic shock due to PNA.  PMH - COPD with chronic respiratory failure on 3L oxygen, PAF, CAD, CVA, depression and anxiety, cognitive impairment, heavy alcohol use, chf.  Clinical Impression  Pt admitted with above diagnosis and presents to PT with functional limitations due to deficits listed below (See PT problem list). Pt needs skilled PT to maximize independence and safety. Pt very limited with mobility due to weakness and dyspnea. Pt has a girlfriend that helps at home but unclear how much she can be there. Patient will benefit from continued inpatient follow up therapy, <3 hours/day           If plan is discharge home, recommend the following: A lot of help with walking and/or transfers;A lot of help with bathing/dressing/bathroom;Help with stairs or ramp for entrance;Assist for transportation;Assistance with cooking/housework   Can travel by private vehicle   No    Equipment Recommendations Wheelchair (measurements PT)  Recommendations for Other Services       Functional Status Assessment Patient has had a recent decline in their functional status and demonstrates the ability to make significant improvements in function in a reasonable and predictable amount of time.     Precautions / Restrictions Precautions Precautions: Fall;Other (comment) Precaution Comments: watch SpO2      Mobility  Bed Mobility Overal bed mobility: Needs Assistance Bed Mobility: Supine to Sit     Supine to sit: Min assist, HOB elevated, Used rails     General bed mobility comments: Assist to elevate trunk and bring hips to EOB    Transfers Overall transfer level: Needs assistance Equipment used: 2 person hand held assist Transfers: Sit to/from Stand, Bed to  chair/wheelchair/BSC Sit to Stand: +2 physical assistance, Mod assist   Step pivot transfers: +2 physical assistance, Mod assist       General transfer comment: Assist to power up and stabilize. Pt very unsteady and anxious with stepping to the chair requiring assist for support and balance.    Ambulation/Gait               General Gait Details: Unable  Stairs            Wheelchair Mobility     Tilt Bed    Modified Rankin (Stroke Patients Only)       Balance Overall balance assessment: Needs assistance Sitting-balance support: No upper extremity supported, Feet supported Sitting balance-Leahy Scale: Fair     Standing balance support: Bilateral upper extremity supported, During functional activity Standing balance-Leahy Scale: Poor Standing balance comment: BUE support and +2 min assist for static standing                             Pertinent Vitals/Pain Pain Assessment Pain Assessment: No/denies pain    Home Living Family/patient expects to be discharged to:: Private residence   Available Help at Discharge: Family;Available PRN/intermittently Type of Home: House Home Access: Stairs to enter Entrance Stairs-Rails: Right Entrance Stairs-Number of Steps: Pt has ramped entrance w/ 1 STE Alternate Level Stairs-Number of Steps: 14 Home Layout: Two level;Able to live on main level with bedroom/bathroom Home Equipment: Rolling Walker (2 wheels);Cane - single point;Shower seat;Hand held shower head;Grab bars - toilet;Grab bars - tub/shower  Prior Function Prior Level of Function : Needs assist;History of Falls (last six months);Patient poor historian/Family not available       Physical Assist : ADLs (physical)     Mobility Comments: pt reports he doesn't use assistive device because it didn't work too good at home ADLs Comments: Reports his girlfriend helps him much of the time due to it takes him too long to do it      Extremity/Trunk Assessment   Upper Extremity Assessment Upper Extremity Assessment: Defer to OT evaluation    Lower Extremity Assessment Lower Extremity Assessment: Generalized weakness       Communication   Communication Communication: Difficulty communicating thoughts/reduced clarity of speech (mumbles)  Cognition Arousal: Alert Behavior During Therapy: Anxious Overall Cognitive Status: No family/caregiver present to determine baseline cognitive functioning                                 General Comments: Pt poor historian and mumbles making it difficult to understand.        General Comments General comments (skin integrity, edema, etc.): Pt on 3L O2. SpO2 86% with activity. HR 120-130's with activity    Exercises     Assessment/Plan    PT Assessment Patient needs continued PT services  PT Problem List Decreased strength;Decreased activity tolerance;Decreased balance;Decreased mobility;Cardiopulmonary status limiting activity;Decreased safety awareness       PT Treatment Interventions DME instruction;Gait training;Functional mobility training;Therapeutic activities;Therapeutic exercise;Balance training;Patient/family education    PT Goals (Current goals can be found in the Care Plan section)  Acute Rehab PT Goals Patient Stated Goal: not stated PT Goal Formulation: With patient Time For Goal Achievement: 07/16/23 Potential to Achieve Goals: Fair    Frequency Min 1X/week     Co-evaluation               AM-PAC PT "6 Clicks" Mobility  Outcome Measure Help needed turning from your back to your side while in a flat bed without using bedrails?: A Little Help needed moving from lying on your back to sitting on the side of a flat bed without using bedrails?: A Little Help needed moving to and from a bed to a chair (including a wheelchair)?: Total Help needed standing up from a chair using your arms (e.g., wheelchair or bedside chair)?:  Total Help needed to walk in hospital room?: Total Help needed climbing 3-5 steps with a railing? : Total 6 Click Score: 10    End of Session Equipment Utilized During Treatment: Oxygen Activity Tolerance: Patient limited by fatigue;Treatment limited secondary to medical complications (Comment) (dyspnea) Patient left: in chair;with call bell/phone within reach;with chair alarm set Nurse Communication: Mobility status PT Visit Diagnosis: Unsteadiness on feet (R26.81);Other abnormalities of gait and mobility (R26.89);Muscle weakness (generalized) (M62.81)    Time: 3244-0102 PT Time Calculation (min) (ACUTE ONLY): 31 min   Charges:   PT Evaluation $PT Eval Moderate Complexity: 1 Mod PT Treatments $Therapeutic Activity: 8-22 mins PT General Charges $$ ACUTE PT VISIT: 1 Visit         [facility] PT Acute Rehabilitation Services Office [phone]   Angelina Ok [facility] 07/02/2023, 12:30 PM

## 2023-07-03 DIAGNOSIS — J189 Pneumonia, unspecified organism: Secondary | ICD-10-CM | POA: Diagnosis not present

## 2023-07-03 DIAGNOSIS — A419 Sepsis, unspecified organism: Secondary | ICD-10-CM | POA: Diagnosis not present

## 2023-07-03 DIAGNOSIS — J449 Chronic obstructive pulmonary disease, unspecified: Secondary | ICD-10-CM | POA: Diagnosis not present

## 2023-07-03 DIAGNOSIS — I2510 Atherosclerotic heart disease of native coronary artery without angina pectoris: Secondary | ICD-10-CM | POA: Diagnosis not present

## 2023-07-03 DIAGNOSIS — L89899 Pressure ulcer of other site, unspecified stage: Secondary | ICD-10-CM

## 2023-07-03 LAB — COMPREHENSIVE METABOLIC PANEL
ALT: 19 U/L (ref 0–44)
AST: 42 U/L — ABNORMAL HIGH (ref 15–41)
Albumin: 2.6 g/dL — ABNORMAL LOW (ref 3.5–5.0)
Alkaline Phosphatase: 101 U/L (ref 38–126)
Anion gap: 11 (ref 5–15)
BUN: 63 mg/dL — ABNORMAL HIGH (ref 8–23)
CO2: 24 mmol/L (ref 22–32)
Calcium: 9.1 mg/dL (ref 8.9–10.3)
Chloride: 100 mmol/L (ref 98–111)
Creatinine, Ser: 2.22 mg/dL — ABNORMAL HIGH (ref 0.61–1.24)
GFR, Estimated: 30 mL/min — ABNORMAL LOW (ref >60)
Glucose, Bld: 129 mg/dL — ABNORMAL HIGH (ref 70–99)
Potassium: 4.3 mmol/L (ref 3.5–5.1)
Sodium: 135 mmol/L (ref 135–145)
Total Bilirubin: 0.4 mg/dL (ref <1.2)
Total Protein: 6.4 g/dL — ABNORMAL LOW (ref 6.5–8.1)

## 2023-07-03 LAB — CBC
HCT: 35.3 % — ABNORMAL LOW (ref 39.0–52.0)
Hemoglobin: 10.8 g/dL — ABNORMAL LOW (ref 13.0–17.0)
MCH: 25.1 pg — ABNORMAL LOW (ref 26.0–34.0)
MCHC: 30.6 g/dL (ref 30.0–36.0)
MCV: 82.1 fL (ref 80.0–100.0)
Platelets: 629 10*3/uL — ABNORMAL HIGH (ref 150–400)
RBC: 4.3 MIL/uL (ref 4.22–5.81)
RDW: 16.9 % — ABNORMAL HIGH (ref 11.5–15.5)
WBC: 23.3 10*3/uL — ABNORMAL HIGH (ref 4.0–10.5)
nRBC: 0.1 % (ref 0.0–0.2)

## 2023-07-03 LAB — BLOOD CULTURE ID PANEL (REFLEXED) - BCID2

## 2023-07-03 NOTE — Progress Notes (Signed)
PROGRESS NOTE    Zachary Chambers  MWU:[id number] DOB: 1951/03/28 DOA: 07/01/2023 PCP: Zachary Brooks, MD   Brief Narrative:  Zachary Chambers is a 72 y.o. male with medical history significant for advanced copd, cad, chronic hypoxic respiratory failure on 3 L nasal cannula around-the-clock, CVA, A-fib paroxysmal, hypertension who presents with worsening dyspnea cough initiating about a week prior to admission worsening over the past few days.  Hospitalist called for admission given worsening hypoxia from baseline with presumed pneumonia based on imaging.  Assessment & Plan:   Principal Problem:   CAP (community acquired pneumonia) Active Problems:   COPD (chronic obstructive pulmonary disease) (HCC)   Chronic respiratory failure with hypoxia and hypercapnia (HCC) - 2 L/min at rest, 5 L/min with activity, baseline POC2 50 mmHg   PAF (paroxysmal atrial fibrillation) (HCC)   CAD (coronary artery disease)   History of CVA with residual deficit   TOBACCO ABUSE   S/P coronary artery stent placement   Primary hypertension   Septic shock (HCC)   Pressure injury of skin   Septic shock, resolving  Community-acquired pneumonia  Lactic acidosis, POA, resolving -Patient profoundly hypotensive at intake, not initially responsive to fluids requiring midodrine to maintain MAP greater than 65, blood pressure now improving with ongoing fluid resuscitation, midodrine off  - Home metoprolol and Imdur remain on hold -Respiratory status improving, remains markedly dyspneic with minimal exertion -continue to wean oxygen as tolerated (baseline 3 L nasal cannula at home) -Continue cefdinir, doxycycline, steroid taper, nebs -Respiratory viral panel and nasal swab negative, discontinue contact precautions   Acute on chronic hypoxic respiratory failure Baseline: 3 L nasal cannula around-the-clock  -Initially requiring 5 L nasal cannula to maintain sats above 92%, stable -continue to wean   AKI without  history of CKD, stabilizing Likely prerenal in the setting of poor p.o. intake and acute illness as well as profound hypotension  Creatinine peaked on 11/15, continue to follow   COPD, cannot rule out acute exacerbation -Continue nebs, steroid taper ongoing -Transition back to home prednisone 10mg  daily  Paroxysmal A-fib, rate controlled Continue apixaban, rate controlled currently in sinus rhythm Metoprolol held given hypotension   Venous stasis ulcers, chronic - local wound care ongoing   CVA, history of - cont home statin and apixaban (no concurrent aspirin or Plavix due to bleed risk)   CAD, history of -Asymptomatic, continue home statin   HFpEF without acute exacerbation -Hypovolemic, continue IV fluids judiciously, monitor for volume overload -Hold home metop and imdur   Insomnia -Continue home seroquel  DVT prophylaxis: apixaban (ELIQUIS) tablet 2.5 mg Start: 07/14/2023 2200 apixaban (ELIQUIS) tablet 2.5 mg  Code Status:   Code Status: Full Code Family Communication: None present  Status is: Inpatient  Dispo: The patient is from: Home              Anticipated d/c is to: Home              Anticipated d/c date is: 24-48 hours              Patient currently not medically stable for discharge  Consultants:  None  Procedures:  None  Antimicrobials:  Cefdinir, doxycycline  Subjective: No acute issues or events overnight, respiratory status minimally improving, not yet back to baseline, profound exertional dyspnea ongoing but generally improving.  Denies chest pain nausea vomiting diarrhea constipation headache fevers or chills  Objective: Vitals:   07/02/23 1644 07/02/23 2059 07/02/23 2112 07/03/23 0554  BP: 124/75  134/73 125/71  Pulse: 88  87 80  Resp: 16  18 18   Temp: 97.7 F (36.5 C)  98.1 F (36.7 C) (!) 97.4 F (36.3 C)  TempSrc: Oral  Oral Oral  SpO2: 96% 95% 98% 96%  Weight:      Height:        Intake/Output Summary (Last 24 hours) at  07/03/2023 0728 Last data filed at 07/02/2023 1700 Gross per 24 hour  Intake --  Output 50 ml  Net -50 ml   Filed Weights   07/14/2023 1612  Weight: 59 kg    Examination:  General:  Pleasantly resting in bed, No acute distress. HEENT:  Normocephalic atraumatic.  Sclerae nonicteric, noninjected.  Extraocular movements intact bilaterally. Neck:  Without mass or deformity.  Trachea is midline. Lungs: Scant rhonchi left greater than right without overt wheeze or rales. Heart:  Regular rate and rhythm.  Without murmurs, rubs, or gallops. Abdomen:  Soft, nontender, nondistended.  Without guarding or rebound. Extremities: Without cyanosis, clubbing, edema, or obvious deformity. Skin: Bilateral lower extremity ulcers anterior lower extremity -bandages clean dry intact.  Otherwise warm and dry, no erythema.  Data Reviewed: I have personally reviewed following labs and imaging studies  CBC: Recent Labs  Lab 06/28/2023 1700 07/01/23 0521 07/02/23 0446 07/03/23 0353  WBC 49.7* 37.3* 35.2* 23.3*  NEUTROABS 43.3*  --   --   --   HGB 11.9* 10.7* 10.9* 10.8*  HCT 40.4 35.6* 36.1* 35.3*  MCV 85.4 84.2 83.2 82.1  PLT 529* 475* 581* 629*   Basic Metabolic Panel: Recent Labs  Lab 07/09/2023 1700 07/01/23 0521 07/02/23 0446 07/03/23 0353  NA 138 135 137 135  K 4.8 4.8 4.6 4.3  CL 98 99 101 100  CO2 29 26 24 24   GLUCOSE 151* 131* 182* 129*  BUN 33* 48* 62* 63*  CREATININE 1.70* 1.91* 2.71* 2.22*  CALCIUM 9.2 8.5* 9.0 9.1   GFR: Estimated Creatinine Clearance: 24.4 mL/min (A) (by C-G formula based on SCr of 2.22 mg/dL (H)).  CBG: Recent Labs  Lab 07/01/23 0038  GLUCAP 158*   Sepsis Labs: Recent Labs  Lab 06/24/2023 1830 06/18/2023 2033  LATICACIDVEN 1.9 2.2*    Recent Results (from the past 240 hour(s))  Culture, blood (routine x 2)     Status: None (Preliminary result)   Collection Time: 07/10/2023  6:00 PM   Specimen: BLOOD  Result Value Ref Range Status   Specimen  Description BLOOD RIGHT ANTECUBITAL  Final   Special Requests   Final    Blood Culture results may not be optimal due to an excessive volume of blood received in culture bottles   Culture   Final    NO GROWTH 2 DAYS Performed at [facility], [address], [place], [place] [address]    Report Status PENDING  Incomplete  Respiratory (~20 pathogens) panel by PCR     Status: None   Collection Time: 06/22/2023  7:06 PM   Specimen: Nasopharyngeal Swab; Respiratory  Result Value Ref Range Status   Adenovirus NOT DETECTED NOT DETECTED Final   Coronavirus 229E NOT DETECTED NOT DETECTED Final    Comment: (NOTE) The Coronavirus on the Respiratory Panel, DOES NOT test for the novel  Coronavirus (2019 nCoV)    Coronavirus HKU1 NOT DETECTED NOT DETECTED Final   Coronavirus NL63 NOT DETECTED NOT DETECTED Final   Coronavirus OC43 NOT DETECTED NOT DETECTED Final   Metapneumovirus NOT DETECTED NOT DETECTED Final   Rhinovirus /  Enterovirus NOT DETECTED NOT DETECTED Final   Influenza A NOT DETECTED NOT DETECTED Final   Influenza B NOT DETECTED NOT DETECTED Final   Parainfluenza Virus 1 NOT DETECTED NOT DETECTED Final   Parainfluenza Virus 2 NOT DETECTED NOT DETECTED Final   Parainfluenza Virus 3 NOT DETECTED NOT DETECTED Final   Parainfluenza Virus 4 NOT DETECTED NOT DETECTED Final   Respiratory Syncytial Virus NOT DETECTED NOT DETECTED Final   Bordetella pertussis NOT DETECTED NOT DETECTED Final   Bordetella Parapertussis NOT DETECTED NOT DETECTED Final   Chlamydophila pneumoniae NOT DETECTED NOT DETECTED Final   Mycoplasma pneumoniae NOT DETECTED NOT DETECTED Final    Comment: Performed at [facility], [address], [place], [place] [address]  Culture, blood (routine x 2)     Status: None (Preliminary result)   Collection Time: 06/29/2023  8:31 PM   Specimen: BLOOD  Result Value Ref Range Status   Specimen Description BLOOD SITE NOT SPECIFIED  Final   Special Requests   Final     BOTTLES DRAWN AEROBIC AND ANAEROBIC Blood Culture adequate volume   Culture   Final    NO GROWTH 2 DAYS Performed at [facility], [address] [place], [place] [address]    Report Status PENDING  Incomplete  SARS Coronavirus 2 by RT PCR (hospital order, performed in [facility] hospital lab) *cepheid single result test* Anterior Nasal Swab     Status: None   Collection Time: 07/01/23  1:39 PM   Specimen: Anterior Nasal Swab  Result Value Ref Range Status   SARS Coronavirus 2 by RT PCR NEGATIVE NEGATIVE Final    Comment: Performed at [facility], [address] [place], [place] [address]    Radiology Studies: No results found.  Scheduled Meds:  apixaban  2.5 mg Oral BID   atorvastatin  40 mg Oral QHS   cefdinir  300 mg Oral Daily   doxycycline  100 mg Oral Q12H   ipratropium-albuterol  3 mL Nebulization BID   [START ON 07/04/2023] predniSONE  10 mg Oral BID WC   predniSONE  20 mg Oral BID WC   QUEtiapine  25 mg Oral QHS   Continuous Infusions:    LOS: 3 days   Time spent:  Azucena Fallen, DO [facility]  If 7PM-7AM, please contact night-coverage [email or web address]  07/03/2023, 7:28 AM

## 2023-07-03 NOTE — Plan of Care (Signed)

## 2023-07-03 NOTE — Progress Notes (Signed)
PHARMACY - PHYSICIAN COMMUNICATION CRITICAL VALUE ALERT - BLOOD CULTURE IDENTIFICATION (BCID)  Zachary Chambers is an 72 y.o. male who presented to [facility] on 06/24/2023 with a chief complaint of sepsis  Assessment:  contaminant (include suspected source if known)  Name of physician (or Provider) Contacted: Natale Milch  Current antibiotics: PO  Changes to prescribed antibiotics recommended:  Patient is on recommended antibiotics - No changes needed  Results for orders placed or performed during the hospital encounter of 07/04/20  Blood Culture ID Panel (Reflexed) (Collected: 07/04/2020  8:45 AM)  Result Value Ref Range   Enterococcus faecalis NOT DETECTED NOT DETECTED   Enterococcus Faecium NOT DETECTED NOT DETECTED   Listeria monocytogenes NOT DETECTED NOT DETECTED   Staphylococcus species DETECTED (A) NOT DETECTED   Staphylococcus aureus (BCID) NOT DETECTED NOT DETECTED   Staphylococcus epidermidis NOT DETECTED NOT DETECTED   Staphylococcus lugdunensis NOT DETECTED NOT DETECTED   Streptococcus species NOT DETECTED NOT DETECTED   Streptococcus agalactiae NOT DETECTED NOT DETECTED   Streptococcus pneumoniae NOT DETECTED NOT DETECTED   Streptococcus pyogenes NOT DETECTED NOT DETECTED   A.calcoaceticus-baumannii NOT DETECTED NOT DETECTED   Bacteroides fragilis NOT DETECTED NOT DETECTED   Enterobacterales NOT DETECTED NOT DETECTED   Enterobacter cloacae complex NOT DETECTED NOT DETECTED   Escherichia coli NOT DETECTED NOT DETECTED   Klebsiella aerogenes NOT DETECTED NOT DETECTED   Klebsiella oxytoca NOT DETECTED NOT DETECTED   Klebsiella pneumoniae NOT DETECTED NOT DETECTED   Proteus species NOT DETECTED NOT DETECTED   Salmonella species NOT DETECTED NOT DETECTED   Serratia marcescens NOT DETECTED NOT DETECTED   Haemophilus influenzae NOT DETECTED NOT DETECTED   Neisseria meningitidis NOT DETECTED NOT DETECTED   Pseudomonas aeruginosa NOT DETECTED NOT DETECTED   Stenotrophomonas  maltophilia NOT DETECTED NOT DETECTED   Candida albicans NOT DETECTED NOT DETECTED   Candida auris NOT DETECTED NOT DETECTED   Candida glabrata NOT DETECTED NOT DETECTED   Candida krusei NOT DETECTED NOT DETECTED   Candida parapsilosis NOT DETECTED NOT DETECTED   Candida tropicalis NOT DETECTED NOT DETECTED   Cryptococcus neoformans/gattii NOT DETECTED NOT DETECTED    Mosetta Anis 07/03/2023  6:18 PM

## 2023-07-04 ENCOUNTER — Inpatient Hospital Stay (HOSPITAL_COMMUNITY): Payer: Medicare Other

## 2023-07-04 DIAGNOSIS — J9611 Chronic respiratory failure with hypoxia: Secondary | ICD-10-CM | POA: Diagnosis not present

## 2023-07-04 DIAGNOSIS — J449 Chronic obstructive pulmonary disease, unspecified: Secondary | ICD-10-CM | POA: Diagnosis not present

## 2023-07-04 DIAGNOSIS — I480 Paroxysmal atrial fibrillation: Secondary | ICD-10-CM | POA: Diagnosis not present

## 2023-07-04 DIAGNOSIS — A419 Sepsis, unspecified organism: Secondary | ICD-10-CM | POA: Diagnosis not present

## 2023-07-04 LAB — CBC
HCT: 38.7 % — ABNORMAL LOW (ref 39.0–52.0)
Hemoglobin: 11.2 g/dL — ABNORMAL LOW (ref 13.0–17.0)
MCH: 24.2 pg — ABNORMAL LOW (ref 26.0–34.0)
MCHC: 28.9 g/dL — ABNORMAL LOW (ref 30.0–36.0)
MCV: 83.8 fL (ref 80.0–100.0)
Platelets: 755 10*3/uL — ABNORMAL HIGH (ref 150–400)
RBC: 4.62 MIL/uL (ref 4.22–5.81)
RDW: 17.1 % — ABNORMAL HIGH (ref 11.5–15.5)
WBC: 51.7 10*3/uL (ref 4.0–10.5)
nRBC: 0.1 % (ref 0.0–0.2)

## 2023-07-04 LAB — CBC WITH DIFFERENTIAL/PLATELET
Abs Immature Granulocytes: 1.74 10*3/uL — ABNORMAL HIGH (ref 0.00–0.07)
Basophils Absolute: 0.2 10*3/uL — ABNORMAL HIGH (ref 0.0–0.1)
Basophils Relative: 0 %
Eosinophils Absolute: 0 10*3/uL (ref 0.0–0.5)
Eosinophils Relative: 0 %
HCT: 38.1 % — ABNORMAL LOW (ref 39.0–52.0)
Hemoglobin: 11.3 g/dL — ABNORMAL LOW (ref 13.0–17.0)
Immature Granulocytes: 3 %
Lymphocytes Relative: 1 %
Lymphs Abs: 0.3 10*3/uL — ABNORMAL LOW (ref 0.7–4.0)
MCH: 24.8 pg — ABNORMAL LOW (ref 26.0–34.0)
MCHC: 29.7 g/dL — ABNORMAL LOW (ref 30.0–36.0)
MCV: 83.6 fL (ref 80.0–100.0)
Monocytes Absolute: 3.6 10*3/uL — ABNORMAL HIGH (ref 0.1–1.0)
Monocytes Relative: 7 %
Neutro Abs: 48.6 10*3/uL — ABNORMAL HIGH (ref 1.7–7.7)
Neutrophils Relative %: 89 %
Platelets: 741 10*3/uL — ABNORMAL HIGH (ref 150–400)
RBC: 4.56 MIL/uL (ref 4.22–5.81)
RDW: 16.9 % — ABNORMAL HIGH (ref 11.5–15.5)
Smear Review: NORMAL
WBC Morphology: ABNORMAL
WBC: 54.4 10*3/uL (ref 4.0–10.5)
nRBC: 0.1 % (ref 0.0–0.2)

## 2023-07-04 LAB — COMPREHENSIVE METABOLIC PANEL
ALT: 22 U/L (ref 0–44)
AST: 51 U/L — ABNORMAL HIGH (ref 15–41)
Albumin: 2.9 g/dL — ABNORMAL LOW (ref 3.5–5.0)
Alkaline Phosphatase: 175 U/L — ABNORMAL HIGH (ref 38–126)
Anion gap: 13 (ref 5–15)
BUN: 48 mg/dL — ABNORMAL HIGH (ref 8–23)
CO2: 24 mmol/L (ref 22–32)
Calcium: 9.5 mg/dL (ref 8.9–10.3)
Chloride: 100 mmol/L (ref 98–111)
Creatinine, Ser: 1.75 mg/dL — ABNORMAL HIGH (ref 0.61–1.24)
GFR, Estimated: 40 mL/min — ABNORMAL LOW (ref >60)
Glucose, Bld: 131 mg/dL — ABNORMAL HIGH (ref 70–99)
Potassium: 4.7 mmol/L (ref 3.5–5.1)
Sodium: 137 mmol/L (ref 135–145)
Total Bilirubin: 0.4 mg/dL (ref <1.2)
Total Protein: 7.2 g/dL (ref 6.5–8.1)

## 2023-07-04 LAB — BLOOD GAS, ARTERIAL
Acid-Base Excess: 2 mmol/L (ref 0.0–2.0)
Bicarbonate: 30 mmol/L — ABNORMAL HIGH (ref 20.0–28.0)
O2 Saturation: 96.4 %
Patient temperature: 37.1
pCO2 arterial: 61 mm[Hg] — ABNORMAL HIGH (ref 32–48)
pH, Arterial: 7.3 — ABNORMAL LOW (ref 7.35–7.45)
pO2, Arterial: 76 mm[Hg] — ABNORMAL LOW (ref 83–108)

## 2023-07-04 MED ORDER — METOPROLOL TARTRATE 25 MG PO TABS
50.0000 mg | ORAL_TABLET | Freq: Two times a day (BID) | ORAL | Status: DC
  Administered 2023-07-05 (×2): 50 mg via ORAL
  Filled 2023-07-04: qty 2
  Filled 2023-07-04 (×2): qty 1

## 2023-07-04 NOTE — Progress Notes (Signed)
Patient transported to [phone]. Placed on BIPAP 15/5 50% due to increased WOB, SOB. Tolerating well at this time.

## 2023-07-04 NOTE — Progress Notes (Signed)
RT called to patient room due to patient having increased work of breathing and RN needing to increase [place] to 6L from 3L.  Upon arrival, patient noted to have pursed lip breathing.  Expiratory wheezing noted but received Albuterol at 0759.  Sats of 92% and HR at 130.  Chest xray ordered and obtained.  RN paged MD and rapid response called.  Orders for patient to be placed on bipap.  Patient was placed on bipap and tolerated well for about an hour then rapid response RN called RT and stated that patient was pulling it off and wanted to leave it off.  Will continue to monitor.

## 2023-07-04 NOTE — Progress Notes (Signed)
Patient at 24 beats of SVT.Back to ST.MD made aware.

## 2023-07-04 NOTE — Progress Notes (Signed)
ABG obtained and sent down to main lab without complications.

## 2023-07-04 NOTE — Plan of Care (Signed)
  Problem: Elimination: Goal: Will not experience complications related to bowel motility Outcome: Progressing Goal: Will not experience complications related to urinary retention Outcome: Progressing   Problem: Safety: Goal: Ability to remain free from injury will improve Outcome: Progressing   

## 2023-07-04 NOTE — Progress Notes (Addendum)
PROGRESS NOTE    [name]  LKG:[id number] DOB: 02-Jul-1951 DOA: 06/25/2023 PCP: Donita Brooks, MD   Brief Narrative:  Zachary Chambers is a 72 y.o. male with medical history significant for advanced copd, cad, chronic hypoxic respiratory failure on 3 L nasal cannula around-the-clock, CVA, A-fib paroxysmal, hypertension who presents with worsening dyspnea cough initiating about a week prior to admission worsening over the past few days.  Hospitalist called for admission given worsening hypoxia from baseline with presumed pneumonia based on imaging.  Assessment & Plan:   Principal Problem:   CAP (community acquired pneumonia) Active Problems:   COPD (chronic obstructive pulmonary disease) (HCC)   Chronic respiratory failure with hypoxia and hypercapnia (HCC) - 2 L/min at rest, 5 L/min with activity, baseline POC2 50 mmHg   PAF (paroxysmal atrial fibrillation) (HCC)   CAD (coronary artery disease)   History of CVA with residual deficit   TOBACCO ABUSE   S/P coronary artery stent placement   Primary hypertension   Septic shock (HCC)   Pressure injury of skin   Septic shock, resolving  Community-acquired pneumonia  Lactic acidosis, POA, resolving Worsening leukocytosis -Patient profoundly hypotensive at intake, not initially responsive to fluids requiring midodrine to maintain MAP greater than 65, blood pressure now improving with ongoing fluid resuscitation, midodrine off  - Home metoprolol and Imdur remain on hold -Respiratory status initially improving, but had acute hypoxic event overnight, see below, likely due to aspiration-continue to wean oxygen as tolerated (baseline 3 L nasal cannula at home) -Continue cefdinir, doxycycline, steroid taper, nebs -Respiratory viral panel and nasal swab negative -no need for contact precautions -Rapid response at shift change this morning with profoundly worsening respiratory status, noted leukocytosis overnight, into the 50s -repeat CBC with  differential pending -presumed aspiration event   Acute on chronic hypoxic respiratory failure Acutely worsening overnight, presumed aspiration Baseline: 3 L nasal cannula around-the-clock  Patient had weaned down to 3 L nasal cannula as of the 16th, overnight on the 16th and at the 17th patient's respiratory status continued to worsen, x-ray shows bilateral opacity concerning for ongoing pneumonia, new productive cough today  -ABG shows hypoxia and hypercarbia, BiPAP placement attempted -patient somewhat noncompliant initially.  Will transfer to progressive floor for ongoing close monitoring and ongoing BiPAP use.   AKI without history of CKD, stabilizing Likely prerenal in the setting of poor p.o. intake and acute illness as well as profound hypotension  Creatinine peaked on 11/15   COPD, cannot rule out acute exacerbation -Continue nebs, steroid taper ongoing -Wean back to home prednisone 10mg  daily as possible  Paroxysmal A-fib, rate controlled Continue apixaban, rate controlled currently in sinus rhythm Metoprolol resumed today - hypertension/tachycardic overnight (possibly reactive secondary to aspiration)   Venous stasis ulcers, chronic - local wound care ongoing   CVA, history of - cont home statin and apixaban (no concurrent aspirin or Plavix due to bleed risk)   CAD, history of -Asymptomatic, continue home statin   HFpEF without acute exacerbation -Hypovolemic, IV fluids completed, patient appears to be euvolemic, hold further fluids, increase p.o. intake as appropriate -Hold home imdur -metoprolol resumed as above   Insomnia -Continue home seroquel  DVT prophylaxis: apixaban (ELIQUIS) tablet 2.5 mg Start: 07/13/2023 2200 apixaban (ELIQUIS) tablet 2.5 mg  Code Status:   Code Status: Full Code Family Communication: None present  Status is: Inpatient  Dispo: The patient is from: Home              Anticipated  d/c is to: Home              Anticipated d/c date is:  48-72 hours              Patient currently not medically stable for discharge  Consultants:  None  Procedures:  None  Antimicrobials:  Cefdinir, doxycycline  Subjective: Acute respiratory distress overnight with profound tachycardia, hypertension and hypoxia.  Presumed aspiration event.  Transition furring to progressive floor on BiPAP for respiratory support as above.  Objective: Vitals:   07/04/23 0841 07/04/23 0907 07/04/23 0920 07/04/23 0923  BP:   (!) 152/94   Pulse: (!) 130 (!) 138 (!) 129 (!) 129  Resp: (!) 26 (!) 28 (!) 27 (!) 28  Temp:   98.9 F (37.2 C)   TempSrc:   Axillary   SpO2: 92% 93% 95% 95%  Weight:      Height:        Intake/Output Summary (Last 24 hours) at 07/04/2023 1124 Last data filed at 07/03/2023 1804 Gross per 24 hour  Intake 440 ml  Output --  Net 440 ml   Filed Weights   06/23/2023 1612  Weight: 59 kg    Examination:  General:  Pleasantly resting in bed, No acute distress. HEENT:  Normocephalic atraumatic.  Sclerae nonicteric, noninjected.  Extraocular movements intact bilaterally. Neck:  Without mass or deformity.  Trachea is midline. Lungs: End expiratory wheeze with bilateral rhonchi. Heart:  Regular rhythm, tachycardic into the 120s.  Without murmurs, rubs, or gallops. Abdomen:  Soft, nontender, nondistended.  Without guarding or rebound. Extremities: Without cyanosis, clubbing, edema, or obvious deformity. Skin: Bilateral lower extremity ulcers anterior lower extremity -bandages clean dry intact.  Otherwise warm and dry, no erythema.  Data Reviewed: I have personally reviewed following labs and imaging studies  CBC: Recent Labs  Lab 06/23/2023 1700 07/01/23 0521 07/02/23 0446 07/03/23 0353 07/04/23 0529 07/04/23 0657  WBC 49.7* 37.3* 35.2* 23.3* 51.7* 54.4*  NEUTROABS 43.3*  --   --   --   --  48.6*  HGB 11.9* 10.7* 10.9* 10.8* 11.2* 11.3*  HCT 40.4 35.6* 36.1* 35.3* 38.7* 38.1*  MCV 85.4 84.2 83.2 82.1 83.8 83.6  PLT  529* 475* 581* 629* 755* 741*   Basic Metabolic Panel: Recent Labs  Lab 07/09/2023 1700 07/01/23 0521 07/02/23 0446 07/03/23 0353 07/04/23 0529  NA 138 135 137 135 137  K 4.8 4.8 4.6 4.3 4.7  CL 98 99 101 100 100  CO2 29 26 24 24 24   GLUCOSE 151* 131* 182* 129* 131*  BUN 33* 48* 62* 63* 48*  CREATININE 1.70* 1.91* 2.71* 2.22* 1.75*  CALCIUM 9.2 8.5* 9.0 9.1 9.5   GFR: Estimated Creatinine Clearance: 30.9 mL/min (A) (by C-G formula based on SCr of 1.75 mg/dL (H)).  CBG: Recent Labs  Lab 07/01/23 0038  GLUCAP 158*   Sepsis Labs: Recent Labs  Lab 07/09/2023 1830 07/10/2023 2033  LATICACIDVEN 1.9 2.2*    Recent Results (from the past 240 hour(s))  Culture, blood (routine x 2)     Status: None (Preliminary result)   Collection Time: 06/20/2023  6:00 PM   Specimen: BLOOD  Result Value Ref Range Status   Specimen Description BLOOD RIGHT ANTECUBITAL  Final   Special Requests   Final    Blood Culture results may not be optimal due to an excessive volume of blood received in culture bottles   Culture   Final    NO GROWTH 4 DAYS Performed at  [facility], [address], [place], [place] [address]    Report Status PENDING  Incomplete  Respiratory (~20 pathogens) panel by PCR     Status: None   Collection Time: 07/16/2023  7:06 PM   Specimen: Nasopharyngeal Swab; Respiratory  Result Value Ref Range Status   Adenovirus NOT DETECTED NOT DETECTED Final   Coronavirus 229E NOT DETECTED NOT DETECTED Final    Comment: (NOTE) The Coronavirus on the Respiratory Panel, DOES NOT test for the novel  Coronavirus (2019 nCoV)    Coronavirus HKU1 NOT DETECTED NOT DETECTED Final   Coronavirus NL63 NOT DETECTED NOT DETECTED Final   Coronavirus OC43 NOT DETECTED NOT DETECTED Final   Metapneumovirus NOT DETECTED NOT DETECTED Final   Rhinovirus / Enterovirus NOT DETECTED NOT DETECTED Final   Influenza A NOT DETECTED NOT DETECTED Final   Influenza B NOT DETECTED NOT DETECTED Final    Parainfluenza Virus 1 NOT DETECTED NOT DETECTED Final   Parainfluenza Virus 2 NOT DETECTED NOT DETECTED Final   Parainfluenza Virus 3 NOT DETECTED NOT DETECTED Final   Parainfluenza Virus 4 NOT DETECTED NOT DETECTED Final   Respiratory Syncytial Virus NOT DETECTED NOT DETECTED Final   Bordetella pertussis NOT DETECTED NOT DETECTED Final   Bordetella Parapertussis NOT DETECTED NOT DETECTED Final   Chlamydophila pneumoniae NOT DETECTED NOT DETECTED Final   Mycoplasma pneumoniae NOT DETECTED NOT DETECTED Final    Comment: Performed at [facility], [address] [place], [place] [address]  Culture, blood (routine x 2)     Status: None (Preliminary result)   Collection Time: 07/15/2023  8:31 PM   Specimen: BLOOD  Result Value Ref Range Status   Specimen Description BLOOD SITE NOT SPECIFIED  Final   Special Requests   Final    BOTTLES DRAWN AEROBIC AND ANAEROBIC Blood Culture adequate volume   Culture  Setup Time   Final    GRAM POSITIVE COCCI ANAEROBIC BOTTLE ONLY CRITICAL RESULT CALLED TO, READ BACK BY AND VERIFIED WITH: PHARMD MICHAEL BITONTI [id number] BY Berline Chough, [place]    Culture   Final    GRAM POSITIVE COCCI IDENTIFICATION TO FOLLOW Performed at [facility], [address] [place], [place] [address]    Report Status PENDING  Incomplete  Blood Culture ID Panel (Reflexed)     Status: Abnormal   Collection Time: 07/09/2023  8:31 PM  Result Value Ref Range Status   Enterococcus faecalis NOT DETECTED NOT DETECTED Final   Enterococcus Faecium NOT DETECTED NOT DETECTED Final   Listeria monocytogenes NOT DETECTED NOT DETECTED Final   Staphylococcus species DETECTED (A) NOT DETECTED Final    Comment: CRITICAL RESULT CALLED TO, READ BACK BY AND VERIFIED WITH: PHARMD MICHAEL BITONTI [phone] 1818 BY J RAZZAK, MT    Staphylococcus aureus (BCID) NOT DETECTED NOT DETECTED Final   Staphylococcus epidermidis NOT DETECTED NOT DETECTED Final   Staphylococcus lugdunensis NOT  DETECTED NOT DETECTED Final   Streptococcus species NOT DETECTED NOT DETECTED Final   Streptococcus agalactiae NOT DETECTED NOT DETECTED Final   Streptococcus pneumoniae NOT DETECTED NOT DETECTED Final   Streptococcus pyogenes NOT DETECTED NOT DETECTED Final   A.calcoaceticus-baumannii NOT DETECTED NOT DETECTED Final   Bacteroides fragilis NOT DETECTED NOT DETECTED Final   Enterobacterales NOT DETECTED NOT DETECTED Final   Enterobacter cloacae complex NOT DETECTED NOT DETECTED Final   Escherichia coli NOT DETECTED NOT DETECTED Final   Klebsiella aerogenes NOT DETECTED NOT DETECTED Final   Klebsiella  oxytoca NOT DETECTED NOT DETECTED Final   Klebsiella pneumoniae NOT DETECTED NOT DETECTED Final   Proteus species NOT DETECTED NOT DETECTED Final   Salmonella species NOT DETECTED NOT DETECTED Final   Serratia marcescens NOT DETECTED NOT DETECTED Final   Haemophilus influenzae NOT DETECTED NOT DETECTED Final   Neisseria meningitidis NOT DETECTED NOT DETECTED Final   Pseudomonas aeruginosa NOT DETECTED NOT DETECTED Final   Stenotrophomonas maltophilia NOT DETECTED NOT DETECTED Final   Candida albicans NOT DETECTED NOT DETECTED Final   Candida auris NOT DETECTED NOT DETECTED Final   Candida glabrata NOT DETECTED NOT DETECTED Final   Candida krusei NOT DETECTED NOT DETECTED Final   Candida parapsilosis NOT DETECTED NOT DETECTED Final   Candida tropicalis NOT DETECTED NOT DETECTED Final   Cryptococcus neoformans/gattii NOT DETECTED NOT DETECTED Final    Comment: Performed at [facility], [address] [place], [place] [address]  SARS Coronavirus 2 by RT PCR (hospital order, performed in [facility] hospital lab) *cepheid single result test* Anterior Nasal Swab     Status: None   Collection Time: 07/01/23  1:39 PM   Specimen: Anterior Nasal Swab  Result Value Ref Range Status   SARS Coronavirus 2 by RT PCR NEGATIVE NEGATIVE Final    Comment: Performed at [facility], [address], [place], [place] [address]    Radiology Studies: DG CHEST PORT 1 VIEW  Result Date: 07/04/2023 CLINICAL DATA:  Hypoxia EXAM: PORTABLE CHEST 1 VIEW COMPARISON:  07/10/2023. FINDINGS: Hyperinflated lungs consistent with COPD. Alveolar consolidation right apex in the left base consistent with pneumonia. No pneumothorax. Normal pulmonary vasculature. Calcified aorta. IMPRESSION: COPD. Bilateral alveolar consolidation consistent with pneumonia. Electronically Signed   By: Layla Maw M.D.   On: 07/04/2023 08:51    Scheduled Meds:  apixaban  2.5 mg Oral BID   atorvastatin  40 mg Oral QHS   cefdinir  300 mg Oral Daily   doxycycline  100 mg Oral Q12H   predniSONE  10 mg Oral BID WC   QUEtiapine  25 mg Oral QHS   Continuous Infusions:    LOS: 4 days   Time spent:  Azucena Fallen, DO [facility]  If 7PM-7AM, please contact night-coverage [email or web address]  07/04/2023, 11:24 AM

## 2023-07-04 NOTE — Progress Notes (Signed)
Received a call from bedside RN regarding abnormal lab results with WBCs 51.7K from 23.3K yesterday.  CBC with differentials and peripheral blood cultures x 2 ordered.  Will continue to closely monitor and treat as indicated.

## 2023-07-04 NOTE — Significant Event (Signed)
Rapid Response Event Note   Reason for Call :  Called by RT for likely Bipap placement and PCU transfer  RT called to room initially for increased WOB and increased O2 need from 3L to 6L [place].   Initial Focused Assessment:  Patient lying in bed in NAD A&O x2 (apparent baseline). Wheezing and rhonchi heard throughout, some productive cough.  Heart tones fast. Skin warm/dry. BLE wrapped in gauze. No edema noted.   152/94 (112) HR 138 RR 28 O2 93% 6L [place] Temp 98.9  Interventions:  CXR Bipap MD at bedside  Plan of Care:  Continue bipap as tolerated, PCU transfer.   Event Summary:  MD Notified: Alease Frame MD Call Time: [phone] Arrival Time: late arrival due to previous call End Time:   Truddie Crumble, RN

## 2023-07-04 NOTE — Progress Notes (Signed)
   07/04/23 2022  BiPAP/CPAP/SIPAP  BiPAP/CPAP/SIPAP Pt Type Adult  BiPAP/CPAP/SIPAP V60  Mask Type Full face mask  Mask Size Medium  Set Rate 16 breaths/min  Respiratory Rate 18 breaths/min  IPAP 18 cmH20  EPAP 6 cmH2O  FiO2 (%) 45 %  Minute Ventilation 12.4  Leak 66  Peak Inspiratory Pressure (PIP) 18  Tidal Volume (Vt) 673  Patient Home Equipment No  Auto Titrate No  Press High Alarm 30 cmH2O  Press Low Alarm 5 cmH2O   Pt sleeping comfortably on bipap.  RT woke patient up briefly to adjust mask and patient was responsive and appropriate.  RT will continue to monitor

## 2023-07-04 NOTE — NC FL2 (Signed)
[place] MEDICAID FL2 LEVEL OF CARE FORM     IDENTIFICATION  Patient Name: Zachary Chambers Birthdate: 05/24/1951 Sex: male Admission Date (Current Location): 06/23/2023  [facility] and [place] Number:  Producer, television/film/video and Address:  [facility], [address] [place], [place] [address]      Provider Number: [id number]  Attending Physician Name and Address:  Azucena Fallen, MD  Relative Name and Phone Number:       Current Level of Care: Hospital Recommended Level of Care: Skilled Nursing Facility Prior Approval Number:    Date Approved/Denied:   PASRR Number: [id number]  Discharge Plan: SNF    Current Diagnoses: Patient Active Problem List   Diagnosis Date Noted   Septic shock (HCC) 07/02/2023   Pressure injury of skin 07/02/2023   CAP (community acquired pneumonia) 06/22/2023   Malnutrition of moderate degree 07/29/2022   Sepsis due to gram-negative UTI (HCC) 07/27/2022   FTT (failure to thrive) in adult with weakness and frequent falls 07/27/2022   multiple skin tears from falls and early sacral pressure ulcer 07/27/2022   Depression 11/05/2021   PSVT (paroxysmal supraventricular tachycardia) (HCC) 11/04/2021   Primary hypertension    Hypokalemia 03/12/2021   Anxiety 10/07/2020   History of CVA with residual deficit 10/07/2020   Chronic respiratory failure with hypoxia and hypercapnia (HCC) - 2 L/min at rest, 5 L/min with activity, baseline POC2 50 mmHg 07/05/2020   PAF (paroxysmal atrial fibrillation) (HCC) 07/05/2020   Dyspnea 07/04/2020   Protein-calorie malnutrition, severe 03/07/2020   S/P coronary artery stent placement    Hyperlipidemia 03/06/2020   Memory changes 03/04/2018   Bruit 01/22/2014   COPD (chronic obstructive pulmonary disease) (HCC) 01/14/2011   CAD (coronary artery disease) 01/14/2011   GERD (gastroesophageal reflux disease) 01/14/2011   TOBACCO ABUSE 07/25/2009   Osteoarthritis 07/25/2009   Disorder of  bursae and tendons in shoulder region 07/25/2009    Orientation RESPIRATION BLADDER Height & Weight     Self, Time  Normal External catheter Weight: 130 lb 1.1 oz (59 kg) Height:  5\' 8"  (172.7 cm)  BEHAVIORAL SYMPTOMS/MOOD NEUROLOGICAL BOWEL NUTRITION STATUS      Continent Diet (See DC summary)  AMBULATORY STATUS COMMUNICATION OF NEEDS Skin   Extensive Assist Verbally Normal, PU Stage and Appropriate Care ([place])                       Personal Care Assistance Level of Assistance  Bathing, Feeding, Dressing Bathing Assistance: Maximum assistance Feeding assistance: Limited assistance Dressing Assistance: Maximum assistance     Functional Limitations Info  Sight, Hearing, Speech Sight Info: Impaired (Glasses) Hearing Info: Adequate Speech Info: Adequate    SPECIAL CARE FACTORS FREQUENCY  PT (By licensed PT), OT (By licensed OT)     PT Frequency: 5x a week OT Frequency: 5x a week            Contractures Contractures Info: Not present    Additional Factors Info  Code Status, Allergies Code Status Info: full Allergies Info: NKA           Current Medications (07/04/2023):  This is the current hospital active medication list Current Facility-Administered Medications  Medication Dose Route Frequency Provider Last Rate Last Admin   acetaminophen (TYLENOL) tablet 650 mg  650 mg Oral Q6H PRN Wouk, Wilfred Curtis, MD       Or   acetaminophen (TYLENOL) suppository 650 mg  650 mg Rectal Q6H PRN  Wouk, Wilfred Curtis, MD       albuterol (PROVENTIL) (2.5 MG/3ML) 0.083% nebulizer solution 2.5 mg  2.5 mg Nebulization Q2H PRN Wouk, Wilfred Curtis, MD   2.5 mg at 07/04/23 0759   apixaban (ELIQUIS) tablet 2.5 mg  2.5 mg Oral BID Knute Neu, RPH   2.5 mg at 07/04/23 1049   atorvastatin (LIPITOR) tablet 40 mg  40 mg Oral QHS Kathrynn Running, MD   40 mg at 07/03/23 2137   cefdinir (OMNICEF) capsule 300 mg  300 mg Oral Daily Pham, Minh Q, RPH-CPP   300 mg at 07/04/23 1049    doxycycline (VIBRA-TABS) tablet 100 mg  100 mg Oral Q12H Pham, Minh Q, RPH-CPP   100 mg at 07/04/23 1049   metoprolol tartrate (LOPRESSOR) tablet 50 mg  50 mg Oral BID Azucena Fallen, MD       predniSONE (DELTASONE) tablet 10 mg  10 mg Oral BID WC Azucena Fallen, MD   10 mg at 07/04/23 1049   QUEtiapine (SEROQUEL) tablet 25 mg  25 mg Oral QHS Kathrynn Running, MD   25 mg at 07/03/23 2137     Discharge Medications: Please see discharge summary for a list of discharge medications.  Relevant Imaging Results:  Relevant Lab Results:   Additional Information SSN:[id number] [place]

## 2023-07-04 NOTE — Progress Notes (Addendum)
At approx 0800 patient's respirations increased to 32, accessory muscles used for breathing, difficulty speaking, gasping for breath, BP elevated and tachycardic.  (See EPIC for VS).  WBC count jumped up to 54 overnight.   Other nurse on floor administered breathing treatment.  Raised up bed, placed patient on 6l o2 [place] from 3l 02 [place]. VS and EKG taken.     MD and RT were paged and informed.  RT put patient on BIPAP.  MD assessed patient,  entered orders and requested  transfer to progressive unit.  Patient was stable on BIPAP.  Patient refused BIPAP after 1 hour stating unable to tolerate.   Placed back on 6l o2 [place]. Administered AM medications.  Patient resting comfortably in bed, resps wnl, no distress.         Reported off to 5W nurse. Questions answered.   Transferred patient to 5W on 6l o2 [place] with assist from RT and tech.

## 2023-07-05 ENCOUNTER — Inpatient Hospital Stay (HOSPITAL_COMMUNITY): Payer: Medicare Other

## 2023-07-05 DIAGNOSIS — J441 Chronic obstructive pulmonary disease with (acute) exacerbation: Secondary | ICD-10-CM | POA: Diagnosis not present

## 2023-07-05 DIAGNOSIS — J189 Pneumonia, unspecified organism: Secondary | ICD-10-CM | POA: Diagnosis not present

## 2023-07-05 DIAGNOSIS — J9622 Acute and chronic respiratory failure with hypercapnia: Secondary | ICD-10-CM

## 2023-07-05 DIAGNOSIS — J9621 Acute and chronic respiratory failure with hypoxia: Secondary | ICD-10-CM | POA: Diagnosis not present

## 2023-07-05 LAB — COMPREHENSIVE METABOLIC PANEL
ALT: 22 U/L (ref 0–44)
AST: 48 U/L — ABNORMAL HIGH (ref 15–41)
Albumin: 2.6 g/dL — ABNORMAL LOW (ref 3.5–5.0)
Alkaline Phosphatase: 156 U/L — ABNORMAL HIGH (ref 38–126)
Anion gap: 14 (ref 5–15)
BUN: 48 mg/dL — ABNORMAL HIGH (ref 8–23)
CO2: 25 mmol/L (ref 22–32)
Calcium: 9.8 mg/dL (ref 8.9–10.3)
Chloride: 103 mmol/L (ref 98–111)
Creatinine, Ser: 1.77 mg/dL — ABNORMAL HIGH (ref 0.61–1.24)
GFR, Estimated: 40 mL/min — ABNORMAL LOW (ref >60)
Glucose, Bld: 58 mg/dL — ABNORMAL LOW (ref 70–99)
Potassium: 4.7 mmol/L (ref 3.5–5.1)
Sodium: 142 mmol/L (ref 135–145)
Total Bilirubin: 1 mg/dL (ref <1.2)
Total Protein: 6.7 g/dL (ref 6.5–8.1)

## 2023-07-05 LAB — CULTURE, BLOOD (ROUTINE X 2)
Culture: NO GROWTH
Special Requests: ADEQUATE

## 2023-07-05 LAB — CBC
HCT: 40.5 % (ref 39.0–52.0)
Hemoglobin: 11.7 g/dL — ABNORMAL LOW (ref 13.0–17.0)
MCH: 24.4 pg — ABNORMAL LOW (ref 26.0–34.0)
MCHC: 28.9 g/dL — ABNORMAL LOW (ref 30.0–36.0)
MCV: 84.6 fL (ref 80.0–100.0)
Platelets: 639 10*3/uL — ABNORMAL HIGH (ref 150–400)
RBC: 4.79 MIL/uL (ref 4.22–5.81)
RDW: 17.2 % — ABNORMAL HIGH (ref 11.5–15.5)
WBC: 51.3 10*3/uL (ref 4.0–10.5)
nRBC: 0.1 % (ref 0.0–0.2)

## 2023-07-05 LAB — TSH: TSH: 0.953 u[IU]/mL (ref 0.350–4.500)

## 2023-07-05 LAB — GLUCOSE, CAPILLARY
Glucose-Capillary: 138 mg/dL — ABNORMAL HIGH (ref 70–99)
Glucose-Capillary: 141 mg/dL — ABNORMAL HIGH (ref 70–99)
Glucose-Capillary: 152 mg/dL — ABNORMAL HIGH (ref 70–99)
Glucose-Capillary: 187 mg/dL — ABNORMAL HIGH (ref 70–99)

## 2023-07-05 LAB — C-REACTIVE PROTEIN: CRP: 33.2 mg/dL — ABNORMAL HIGH (ref <1.0)

## 2023-07-05 LAB — PATHOLOGIST SMEAR REVIEW

## 2023-07-05 LAB — PROCALCITONIN: Procalcitonin: 8.26 ng/mL

## 2023-07-05 LAB — BRAIN NATRIURETIC PEPTIDE: B Natriuretic Peptide: 3285.4 pg/mL — ABNORMAL HIGH (ref 0.0–100.0)

## 2023-07-05 LAB — HEMOGLOBIN A1C
Hgb A1c MFr Bld: 5.7 % — ABNORMAL HIGH (ref 4.8–5.6)
Mean Plasma Glucose: 116.89 mg/dL

## 2023-07-05 LAB — MRSA NEXT GEN BY PCR, NASAL: MRSA by PCR Next Gen: DETECTED — AB

## 2023-07-05 MED ORDER — AMIODARONE HCL IN DEXTROSE 360-4.14 MG/200ML-% IV SOLN
30.0000 mg/h | INTRAVENOUS | Status: DC
  Administered 2023-07-05 – 2023-07-07 (×6): 30 mg/h via INTRAVENOUS
  Filled 2023-07-05 (×3): qty 200

## 2023-07-05 MED ORDER — HYDROCERIN EX CREA
TOPICAL_CREAM | Freq: Two times a day (BID) | CUTANEOUS | Status: DC
  Filled 2023-07-05: qty 113

## 2023-07-05 MED ORDER — METOPROLOL TARTRATE 5 MG/5ML IV SOLN
5.0000 mg | INTRAVENOUS | Status: AC
  Administered 2023-07-05: 5 mg via INTRAVENOUS
  Filled 2023-07-05: qty 5

## 2023-07-05 MED ORDER — DILTIAZEM HCL 25 MG/5ML IV SOLN
10.0000 mg | Freq: Four times a day (QID) | INTRAVENOUS | Status: DC | PRN
  Administered 2023-07-06: 10 mg via INTRAVENOUS
  Filled 2023-07-05 (×2): qty 5

## 2023-07-05 MED ORDER — AMIODARONE LOAD VIA INFUSION
150.0000 mg | Freq: Once | INTRAVENOUS | Status: AC
  Administered 2023-07-05: 150 mg via INTRAVENOUS
  Filled 2023-07-05: qty 83.34

## 2023-07-05 MED ORDER — DIGOXIN 0.25 MG/ML IJ SOLN
0.2500 mg | Freq: Four times a day (QID) | INTRAMUSCULAR | Status: AC
Start: 2023-07-05 — End: 2023-07-05
  Administered 2023-07-05 (×2): 0.25 mg via INTRAVENOUS
  Filled 2023-07-05 (×3): qty 1

## 2023-07-05 MED ORDER — INSULIN ASPART 100 UNIT/ML IJ SOLN
0.0000 [IU] | INTRAMUSCULAR | Status: DC
  Administered 2023-07-05: 2 [IU] via SUBCUTANEOUS

## 2023-07-05 MED ORDER — AMIODARONE HCL IN DEXTROSE 360-4.14 MG/200ML-% IV SOLN
60.0000 mg/h | INTRAVENOUS | Status: DC
  Administered 2023-07-05 (×2): 60 mg/h via INTRAVENOUS
  Filled 2023-07-05 (×2): qty 200

## 2023-07-05 MED ORDER — MUPIROCIN 2 % EX OINT
1.0000 | TOPICAL_OINTMENT | Freq: Two times a day (BID) | CUTANEOUS | Status: DC
  Administered 2023-07-05 – 2023-07-07 (×4): 1 via NASAL
  Filled 2023-07-05 (×2): qty 22

## 2023-07-05 MED ORDER — ALBUTEROL (5 MG/ML) CONTINUOUS INHALATION SOLN
20.0000 mg | INHALATION_SOLUTION | RESPIRATORY_TRACT | Status: DC
  Administered 2023-07-05: 20 mg via RESPIRATORY_TRACT
  Filled 2023-07-05: qty 20

## 2023-07-05 MED ORDER — DEXTROSE 5 % IV SOLN: 500.0000 mg | INTRAVENOUS | Status: DC

## 2023-07-05 MED ORDER — CHLORHEXIDINE GLUCONATE CLOTH 2 % EX PADS
6.0000 | MEDICATED_PAD | Freq: Every day | CUTANEOUS | Status: DC
  Administered 2023-07-05 – 2023-07-06 (×2): 6 via TOPICAL

## 2023-07-05 MED ORDER — FUROSEMIDE 10 MG/ML IJ SOLN
40.0000 mg | Freq: Once | INTRAMUSCULAR | Status: AC
  Administered 2023-07-05: 40 mg via INTRAVENOUS
  Filled 2023-07-05: qty 4

## 2023-07-05 MED ORDER — ALBUTEROL SULFATE (2.5 MG/3ML) 0.083% IN NEBU
INHALATION_SOLUTION | RESPIRATORY_TRACT | Status: AC
  Filled 2023-07-05: qty 24

## 2023-07-05 MED ORDER — SODIUM CHLORIDE 0.9 % IV BOLUS
250.0000 mL | INTRAVENOUS | Status: AC
  Administered 2023-07-05: 250 mL via INTRAVENOUS

## 2023-07-05 MED ORDER — DILTIAZEM HCL 60 MG PO TABS
60.0000 mg | ORAL_TABLET | Freq: Three times a day (TID) | ORAL | Status: DC
  Administered 2023-07-05 – 2023-07-06 (×4): 60 mg via ORAL
  Filled 2023-07-05 (×7): qty 1

## 2023-07-05 MED ORDER — IPRATROPIUM-ALBUTEROL 0.5-2.5 (3) MG/3ML IN SOLN
3.0000 mL | RESPIRATORY_TRACT | Status: DC
  Administered 2023-07-05 – 2023-07-07 (×11): 3 mL via RESPIRATORY_TRACT
  Filled 2023-07-05 (×11): qty 3

## 2023-07-05 MED ORDER — METOPROLOL TARTRATE 5 MG/5ML IV SOLN: 2.5000 mg | Freq: Once | INTRAVENOUS | Status: DC

## 2023-07-05 MED ORDER — INSULIN ASPART 100 UNIT/ML IJ SOLN
0.0000 [IU] | INTRAMUSCULAR | Status: DC
  Administered 2023-07-05 – 2023-07-06 (×3): 1 [IU] via SUBCUTANEOUS
  Administered 2023-07-06: 2 [IU] via SUBCUTANEOUS
  Administered 2023-07-06 – 2023-07-07 (×4): 1 [IU] via SUBCUTANEOUS
  Administered 2023-07-07 (×2): 2 [IU] via SUBCUTANEOUS
  Administered 2023-07-07: 1 [IU] via SUBCUTANEOUS

## 2023-07-05 MED ORDER — METHYLPREDNISOLONE SODIUM SUCC 125 MG IJ SOLR
60.0000 mg | Freq: Every day | INTRAMUSCULAR | Status: DC
  Administered 2023-07-05 – 2023-07-07 (×3): 60 mg via INTRAVENOUS
  Filled 2023-07-05 (×3): qty 2

## 2023-07-05 MED ORDER — SODIUM CHLORIDE 0.9 % IV SOLN
1.5000 g | Freq: Three times a day (TID) | INTRAVENOUS | Status: DC
  Administered 2023-07-05 – 2023-07-06 (×3): 1.5 g via INTRAVENOUS
  Filled 2023-07-05 (×6): qty 4

## 2023-07-05 NOTE — Progress Notes (Signed)
Received a call from bedside RN regarding the patient going into A-fib with RVR with heart rates in the 170s 180s.  Presented at bedside.  The patient is on a BiPAP.  Denies any chest pain or palpitations.  IV Lopressor 5 mg x 1 administered with improvement of his heart rate down to the 130s to 140s.  Started amiodarone bolus and drip.  250 cc NS bolus x 1 administered to maintain MAP greater than 65.  Will continue to closely monitor and treat as indicated.  Time: 15 minutes.

## 2023-07-05 NOTE — Progress Notes (Signed)
PT Cancellation Note  Patient Details Name: Zachary Chambers MRN: [id number] DOB: Dec 05, 1950   Cancelled Treatment:    Reason Eval/Treat Not Completed: (P) Medical issues which prohibited therapy (Pt noted to transfer to higher level of care. Will follow up as appropriate.)   [name] 07/05/2023, 11:53 AM

## 2023-07-05 NOTE — Progress Notes (Signed)
Pharmacy Antibiotic Note  Zachary Chambers is a 72 y.o. male admitted on 07/05/2023 with pneumonia. Pt has been on doxy/cefdinir before going in resp failure. He has already gotten about 5d of doxy so we will not start azith. Change cefdinir to Unasyn per Dr. Thedore Mins.  Scr ~1.7  Plan: Unasyn 1.5g IV q8  Height: 5\' 8"  (172.7 cm) Weight: 59 kg (130 lb 1.1 oz) IBW/kg (Calculated) : 68.4  Temp (24hrs), Avg:98.1 F (36.7 C), Min:97.3 F (36.3 C), Yashas:99.6 F (37.6 C)  Recent Labs  Lab 07/09/2023 1830 07/13/2023 2033 07/01/23 0521 07/02/23 0446 07/03/23 0353 07/04/23 0529 07/04/23 0657 07/05/23 0341  WBC  --   --  37.3* 35.2* 23.3* 51.7* 54.4* 51.3*  CREATININE  --   --  1.91* 2.71* 2.22* 1.75*  --  1.77*  LATICACIDVEN 1.9 2.2*  --   --   --   --   --   --     Estimated Creatinine Clearance: 30.6 mL/min (A) (by C-G formula based on SCr of 1.77 mg/dL (H)).    No Known Allergies  Antimicrobials this admission: Azithro 11/13 Cefepime 11/13 x1 Cefdinir 11/14>>11/18 Doxy 11/14>>11/18 Unasyn 11/18>>  Microbiology results: 11/13 blood: ng x 3 days to date; 1 of 3 GPC/Staph sp MRSA pending > doesn't appear sent 11/13 Resp panel: neg 11/13 COVID: neg 11/17 blood: ngtd  Ulyses Southward, PharmD, BCIDP, AAHIVP, CPP Infectious Disease Pharmacist 07/05/2023 9:30 AM

## 2023-07-05 NOTE — Progress Notes (Signed)
Pt was given metoprolol 5mg  IVP. HR down to 149. BP WNL see EPIC. Amiodarone bolus was given followed by amiodarone gtt at 33.31ml/hr. Current BP=112/69. HR=122.

## 2023-07-05 NOTE — Progress Notes (Signed)
SLP Cancellation Note  Patient Details Name: Zachary Chambers MRN: [id number] DOB: [birth year]   Cancelled treatment:       Reason Eval/Treat Not Completed: Medical issues which prohibited therapy;pt on Bipap and pending ICU xfer per nursing report; nursing deferred BSE. ST will continue efforts.   Pat Shadaya Marschner,M.S.,CCC-SLP 07/05/2023, 11:09 AM

## 2023-07-05 NOTE — Progress Notes (Signed)
PROGRESS NOTE    Zachary Chambers  HYQ:[id number] DOB: 17-Jun-1951 DOA: 07/09/2023 PCP: Donita Brooks, MD   Brief Narrative:  Zachary Chambers is a 72 y.o. male with medical history significant for advanced copd, cad, chronic hypoxic respiratory failure on 3 L nasal cannula around-the-clock, CVA, A-fib paroxysmal, hypertension who presents with worsening dyspnea cough initiating about a week prior to admission worsening over the past few days.  Hospitalist called for admission given worsening hypoxia from baseline with presumed pneumonia based on imaging.  He was initially on the floor for 24 hours but had acute worsening on 07/04/2023 had to be placed on BiPAP and transferred to my service on 07/05/2023.  Assessment & Plan:    Septic shock, due to aspiration/community-acquired pneumonia, acute on chronic hypoxic respiratory failure at baseline on 3 L nasal cannula oxygen, underlying COPD chronically on steroids.  He was admitted 5 days ago with community-acquired pneumonia, initially showed some improvement however had an episode of aspiration on 07/04/2023 with acute worsening requiring BiPAP, he has been on BiPAP close to 24 hours, cannot tolerate BiPAP removal, still quite hypoxic and coarse bilateral breath sounds.  Escalate his antibiotics to IV, deep suctioning, n.p.o. except medications, nebulizer treatments, IV steroids.  PCCM input.  He may require intubation.  Septic shock physiology has improved.    Total Critical Care time in examining the patient bedside, evaluating Lab work and other data, over half of the total time was spent in coordinating patient care on the floor or bedisde in talking to patient/family members, communicating with nursing Staff on the floor and sub specialists - PCCM  to coordinate patients medical care and needs is  40 Minutes.   The condition which has caused critical injury/acute impairment of Resp. vital organ system with a high probability of sudden clinically  significant deterioration and can cause Potential Life threatening injury to this patient addressed today is cute on chronic hypoxic respiratory failure due to ongoing aspiration pneumonia requiring BiPAP.   AKI without history of CKD, stabilizing  Likely prerenal in the setting of poor p.o. intake and acute illness as well as profound hypotension, hydrated, blood pressure has improved, stabilizing.    COPD, cannot rule out acute exacerbation  -Advanced COPD at baseline uses 2 to 3 L nasal cannula oxygen at home along with 10 mg of prednisone, IV steroids for now.  Minimal to no wheezing.  Paroxysmal A-fib, in RVR.  [place] vas 2 score of greater than 3.  In RVR morning of 07/05/2023, amiodarone drip, oral Cardizem, 2 doses of IV digoxin, continue Eliquis and monitor.  Check TSH and echocardiogram.   Venous stasis ulcers, chronic  - local wound care ongoing   CVA, history of   - cont home statin and apixaban (no concurrent aspirin or Plavix due to bleed risk)   CAD, history of  -Asymptomatic, continue home statin   HFpEF without acute exacerbation, last EF 60% in 2022-was dehydrated with AKI initially, diuretics held and he was hydrated, renal function stabilizing, last echo 70% in 2022, sequently now appears to be in slight acute on chronic diastolic dysfunction, hold further IV fluids, gentle Lasix on 07/05/2023 and monitor.  Repeat echo.   Insomnia  -Continue home seroquel  Hx of alcohol abuse.  Counseled to quit.  Out of window for DTs.  Monitor.   DVT prophylaxis: apixaban (ELIQUIS) tablet 2.5 mg Start: 07/05/2023 2200 apixaban (ELIQUIS) tablet 2.5 mg  Code Status:   Code Status: Full Code Family  Communication: Called wife on the listed phone number [phone]  07/05/2023 at 9:20 AM, went to voicemail, voicemail box not set up.  Status is: Inpatient  Dispo: The patient is from: Home              Anticipated d/c is to: Home              Anticipated d/c date is: 48-72 hours               Patient currently not medically stable for discharge  Consultants:  None  Procedures:  PCCM  Antimicrobials:  Cefdinir, doxycycline  Subjective: Patient in bed denies any headache or chest pain, still has cough and shortness of breath, no focal deficits  Objective: Vitals:   07/05/23 0809 07/05/23 0816 07/05/23 0835 07/05/23 0836  BP: 139/81  (!) 143/80   Pulse:  (!) 128 (!) 148   Resp:   20   Temp:   99.6 F (37.6 C)   TempSrc:   Oral Oral  SpO2:   90%   Weight:      Height:       No intake or output data in the 24 hours ending 07/05/23 0918  Filed Weights   07/08/2023 1612  Weight: 59 kg    Examination:  Awake, mildly confused appears extremely frail, no focal deficits, [place].AT,PERRAL Supple Neck, No JVD,   Symmetrical Chest wall movement, coarse bilateral breath sounds RRR,No Gallops, Rubs or new Murmurs,  +ve B.Sounds, Abd Soft, No tenderness,   No Cyanosis, Clubbing or edema     Data Reviewed: I have personally reviewed following labs and imaging studies  Recent Labs  Lab 07/12/2023 1700 07/01/23 0521 07/02/23 0446 07/03/23 0353 07/04/23 0529 07/04/23 0657 07/05/23 0341  WBC 49.7*   < > 35.2* 23.3* 51.7* 54.4* 51.3*  HGB 11.9*   < > 10.9* 10.8* 11.2* 11.3* 11.7*  HCT 40.4   < > 36.1* 35.3* 38.7* 38.1* 40.5  PLT 529*   < > 581* 629* 755* 741* 639*  MCV 85.4   < > 83.2 82.1 83.8 83.6 84.6  MCH 25.2*   < > 25.1* 25.1* 24.2* 24.8* 24.4*  MCHC 29.5*   < > 30.2 30.6 28.9* 29.7* 28.9*  RDW 16.5*   < > 16.7* 16.9* 17.1* 16.9* 17.2*  LYMPHSABS 0.4*  --   --   --   --  0.3*  --   MONOABS 3.0*  --   --   --   --  3.6*  --   EOSABS 0.0  --   --   --   --  0.0  --   BASOSABS 0.2*  --   --   --   --  0.2*  --    < > = values in this interval not displayed.    Recent Labs  Lab 07/14/2023 1830 06/21/2023 2033 07/01/23 0521 07/02/23 0446 07/03/23 0353 07/04/23 0529 07/05/23 0341 07/05/23 0706  NA  --   --  135 137 135 137 142  --   K  --   --  4.8 4.6  4.3 4.7 4.7  --   CL  --   --  99 101 100 100 103  --   CO2  --   --  26 24 24 24 25   --   ANIONGAP  --   --  10 12 11 13 14   --   GLUCOSE  --   --  131* 182* 129* 131* 58*  --  BUN  --   --  48* 62* 63* 48* 48*  --   CREATININE  --   --  1.91* 2.71* 2.22* 1.75* 1.77*  --   AST  --   --   --  41 42* 51* 48*  --   ALT  --   --   --  18 19 22 22   --   ALKPHOS  --   --   --  116 101 175* 156*  --   BILITOT  --   --   --  0.4 0.4 0.4 1.0  --   ALBUMIN  --   --   --  2.6* 2.6* 2.9* 2.6*  --   CRP  --   --   --   --   --   --   --  33.2*  PROCALCITON  --   --   --   --   --   --   --  8.26  LATICACIDVEN 1.9 2.2*  --   --   --   --   --   --   BNP 696.2* 711.1*  --   --   --   --   --  3,285.4*  CALCIUM  --   --  8.5* 9.0 9.1 9.5 9.8  --     Lab Results  Component Value Date   CHOL 154 03/12/2021   HDL 55 03/12/2021   LDLCALC 92 03/12/2021   TRIG 35 03/12/2021   CHOLHDL 2.8 03/12/2021      Recent Labs  Lab 07/05/2023 1830 07/09/2023 2033 07/01/23 0521 07/02/23 0446 07/03/23 0353 07/04/23 0529 07/05/23 0341 07/05/23 0706  CRP  --   --   --   --   --   --   --  33.2*  PROCALCITON  --   --   --   --   --   --   --  8.26  LATICACIDVEN 1.9 2.2*  --   --   --   --   --   --   BNP 696.2* 711.1*  --   --   --   --   --  3,285.4*  CALCIUM  --   --  8.5* 9.0 9.1 9.5 9.8  --     No results for input(s): "TSH", "T4TOTAL", "FREET4", "T3FREE", "THYROIDAB" in the last 72 hours.  No results for input(s): "VITAMINB12", "FOLATE", "FERRITIN", "TIBC", "IRON", "RETICCTPCT" in the last 72 hours.      Recent Results (from the past 240 hour(s))  Culture, blood (routine x 2)     Status: None   Collection Time: 07/02/2023  6:00 PM   Specimen: BLOOD  Result Value Ref Range Status   Specimen Description BLOOD RIGHT ANTECUBITAL  Final   Special Requests   Final    Blood Culture results may not be optimal due to an excessive volume of blood received in culture bottles   Culture   Final    NO  GROWTH 5 DAYS Performed at [facility], [address], [place], [place] [address]    Report Status 07/05/2023 FINAL  Final  Respiratory (~20 pathogens) panel by PCR     Status: None   Collection Time: 07/16/2023  7:06 PM   Specimen: Nasopharyngeal Swab; Respiratory  Result Value Ref Range Status   Adenovirus NOT DETECTED NOT DETECTED Final   Coronavirus 229E NOT DETECTED NOT DETECTED Final    Comment: (NOTE) The Coronavirus on the Respiratory  Panel, DOES NOT test for the novel  Coronavirus (2019 nCoV)    Coronavirus HKU1 NOT DETECTED NOT DETECTED Final   Coronavirus NL63 NOT DETECTED NOT DETECTED Final   Coronavirus OC43 NOT DETECTED NOT DETECTED Final   Metapneumovirus NOT DETECTED NOT DETECTED Final   Rhinovirus / Enterovirus NOT DETECTED NOT DETECTED Final   Influenza A NOT DETECTED NOT DETECTED Final   Influenza B NOT DETECTED NOT DETECTED Final   Parainfluenza Virus 1 NOT DETECTED NOT DETECTED Final   Parainfluenza Virus 2 NOT DETECTED NOT DETECTED Final   Parainfluenza Virus 3 NOT DETECTED NOT DETECTED Final   Parainfluenza Virus 4 NOT DETECTED NOT DETECTED Final   Respiratory Syncytial Virus NOT DETECTED NOT DETECTED Final   Bordetella pertussis NOT DETECTED NOT DETECTED Final   Bordetella Parapertussis NOT DETECTED NOT DETECTED Final   Chlamydophila pneumoniae NOT DETECTED NOT DETECTED Final   Mycoplasma pneumoniae NOT DETECTED NOT DETECTED Final    Comment: Performed at [facility], [address] [place], [place] [address]  Culture, blood (routine x 2)     Status: None (Preliminary result)   Collection Time: 07/09/2023  8:31 PM   Specimen: BLOOD  Result Value Ref Range Status   Specimen Description BLOOD SITE NOT SPECIFIED  Final   Special Requests   Final    BOTTLES DRAWN AEROBIC AND ANAEROBIC Blood Culture adequate volume   Culture  Setup Time   Final    GRAM POSITIVE COCCI ANAEROBIC BOTTLE ONLY CRITICAL RESULT CALLED TO, READ BACK BY AND VERIFIED  WITH: PHARMD MICHAEL BITONTI [id number] BY Berline Chough, [place]    Culture   Final    GRAM POSITIVE COCCI IDENTIFICATION TO FOLLOW Performed at [facility], [address], [place], [place] [address]    Report Status PENDING  Incomplete  Blood Culture ID Panel (Reflexed)     Status: Abnormal   Collection Time: 07/08/2023  8:31 PM  Result Value Ref Range Status   Enterococcus faecalis NOT DETECTED NOT DETECTED Final   Enterococcus Faecium NOT DETECTED NOT DETECTED Final   Listeria monocytogenes NOT DETECTED NOT DETECTED Final   Staphylococcus species DETECTED (A) NOT DETECTED Final    Comment: CRITICAL RESULT CALLED TO, READ BACK BY AND VERIFIED WITH: PHARMD MICHAEL BITONTI [phone] 1818 BY J RAZZAK, [place]    Staphylococcus aureus (BCID) NOT DETECTED NOT DETECTED Final   Staphylococcus epidermidis NOT DETECTED NOT DETECTED Final   Staphylococcus lugdunensis NOT DETECTED NOT DETECTED Final   Streptococcus species NOT DETECTED NOT DETECTED Final   Streptococcus agalactiae NOT DETECTED NOT DETECTED Final   Streptococcus pneumoniae NOT DETECTED NOT DETECTED Final   Streptococcus pyogenes NOT DETECTED NOT DETECTED Final   A.calcoaceticus-baumannii NOT DETECTED NOT DETECTED Final   Bacteroides fragilis NOT DETECTED NOT DETECTED Final   Enterobacterales NOT DETECTED NOT DETECTED Final   Enterobacter cloacae complex NOT DETECTED NOT DETECTED Final   Escherichia coli NOT DETECTED NOT DETECTED Final   Klebsiella aerogenes NOT DETECTED NOT DETECTED Final   Klebsiella oxytoca NOT DETECTED NOT DETECTED Final   Klebsiella pneumoniae NOT DETECTED NOT DETECTED Final   Proteus species NOT DETECTED NOT DETECTED Final   Salmonella species NOT DETECTED NOT DETECTED Final   Serratia marcescens NOT DETECTED NOT DETECTED Final   Haemophilus influenzae NOT DETECTED NOT DETECTED Final   Neisseria meningitidis NOT DETECTED NOT DETECTED Final   Pseudomonas aeruginosa NOT DETECTED NOT DETECTED Final    Stenotrophomonas maltophilia NOT DETECTED NOT DETECTED Final  Candida albicans NOT DETECTED NOT DETECTED Final   Candida auris NOT DETECTED NOT DETECTED Final   Candida glabrata NOT DETECTED NOT DETECTED Final   Candida krusei NOT DETECTED NOT DETECTED Final   Candida parapsilosis NOT DETECTED NOT DETECTED Final   Candida tropicalis NOT DETECTED NOT DETECTED Final   Cryptococcus neoformans/gattii NOT DETECTED NOT DETECTED Final    Comment: Performed at [facility], [address] [place], [place] [address]  SARS Coronavirus 2 by RT PCR (hospital order, performed in [facility] hospital lab) *cepheid single result test* Anterior Nasal Swab     Status: None   Collection Time: 07/01/23  1:39 PM   Specimen: Anterior Nasal Swab  Result Value Ref Range Status   SARS Coronavirus 2 by RT PCR NEGATIVE NEGATIVE Final    Comment: Performed at [facility], [address], [place], [place] [address]  Culture, blood (Routine X 2) w Reflex to ID Panel     Status: None (Preliminary result)   Collection Time: 07/04/23  6:57 AM   Specimen: BLOOD RIGHT ARM  Result Value Ref Range Status   Specimen Description BLOOD RIGHT ARM  Final   Special Requests   Final    BOTTLES DRAWN AEROBIC AND ANAEROBIC Blood Culture adequate volume   Culture   Final    NO GROWTH 1 DAY Performed at [facility], [address], [place], [place] [address]    Report Status PENDING  Incomplete  Culture, blood (Routine X 2) w Reflex to ID Panel     Status: None (Preliminary result)   Collection Time: 07/04/23  7:00 AM   Specimen: BLOOD LEFT HAND  Result Value Ref Range Status   Specimen Description BLOOD LEFT HAND  Final   Special Requests   Final    BOTTLES DRAWN AEROBIC AND ANAEROBIC Blood Culture adequate volume   Culture   Final    NO GROWTH 1 DAY Performed at [facility], [address], [place], [place] [address]    Report Status PENDING  Incomplete    Radiology Studies: DG Chest Port 1  View  Result Date: 07/05/2023 CLINICAL DATA:  [address] with shortness of breath and pneumonia. EXAM: PORTABLE CHEST 1 VIEW COMPARISON:  Portable chest yesterday at 8:43 a.m. FINDINGS: 6:35 a.m. The lungs emphysematous with chronic changes, including chronic scarring peripheral left mid lung. Airspace consolidation continues in the suprahilar right upper lobe and left base, both sides slightly worsened. There is a small new infiltrate in the suprahilar left upper lobe today. No other new infiltrate is seen. Small pleural effusions are unchanged. The cardiomediastinal silhouette is stable with aortic atherosclerosis. No new osseous findings.  Osteopenia and degenerative changes. IMPRESSION: 1. Slight worsening of airspace consolidation in the suprahilar right upper lobe and left base. 2. Small new infiltrate in the suprahilar left upper lobe today. 3. Emphysema and chronic changes. 4. Stable small pleural effusions. Electronically Signed   By: Almira Bar M.D.   On: 07/05/2023 06:48   DG CHEST PORT 1 VIEW  Result Date: 07/04/2023 CLINICAL DATA:  Hypoxia EXAM: PORTABLE CHEST 1 VIEW COMPARISON:  07/01/2023. FINDINGS: Hyperinflated lungs consistent with COPD. Alveolar consolidation right apex in the left base consistent with pneumonia. No pneumothorax. Normal pulmonary vasculature. Calcified aorta. IMPRESSION: COPD. Bilateral alveolar consolidation consistent with pneumonia. Electronically Signed   By: Layla Maw M.D.   On: 07/04/2023 08:51    Scheduled Meds:  apixaban  2.5 mg Oral BID  atorvastatin  40 mg Oral QHS   cefdinir  300 mg Oral Daily   digoxin  0.25 mg Intravenous Q6H   diltiazem  60 mg Oral Q8H   doxycycline  100 mg Oral Q12H   furosemide  40 mg Intravenous Once   hydrocerin   Topical BID   methylPREDNISolone (SOLU-MEDROL) injection  60 mg Intravenous Daily   metoprolol tartrate  50 mg Oral BID   QUEtiapine  25 mg Oral QHS   Continuous Infusions:  amiodarone 60 mg/hr (07/05/23  0719)   Followed by   amiodarone       LOS: 5 days   Time spent:  Signature  -    Susa Raring M.D on 07/05/2023 at 9:18 AM   -  To page go to [email or web address]

## 2023-07-05 NOTE — Progress Notes (Signed)
RT note. Patient transported from 5W to 71M on bipap without any complications.   07/05/23 1147  BiPAP/CPAP/SIPAP  BiPAP/CPAP/SIPAP Pt Type Adult  BiPAP/CPAP/SIPAP V60  Mask Type Full face mask  Mask Size Medium  Set Rate 16 breaths/min  Respiratory Rate 23 breaths/min  IPAP 18 cmH20  EPAP 6 cmH2O  FiO2 (%) 45 %  Minute Ventilation 16.1  Leak 86  Peak Inspiratory Pressure (PIP) 18  Tidal Volume (Vt) 746  Patient Home Equipment No  Auto Titrate No  BiPAP/CPAP /SiPAP Vitals  Pulse Rate 74  Resp (!) 22  SpO2 99 %  MEWS Score/Color  MEWS Score 1  MEWS Score Color Zachary Chambers

## 2023-07-05 NOTE — Progress Notes (Signed)
Dr. Margo Aye was made aware that pt's HR is in 180's HR is SVT.Marland Kitchen

## 2023-07-05 NOTE — Progress Notes (Signed)
RT note. Patient placed on 5LNC this morning during rounds, patient sat 94% with no labored breathing noted. MD/ RN aware.   RN called RT to patient bedside due to desat and labored breathing. Upon arrival pt. Still on Maxbass sat 91% with labored breathing noted. Patient refusing to put bipap on at this time. V60 in patients room if needed later.  RT will continue to monitor.

## 2023-07-05 NOTE — Progress Notes (Signed)
   07/05/23 2139  Therapy Vitals  Pulse Rate 90  Resp 20  Patient Position (if appropriate) Sitting  MEWS Score/Color  MEWS Score 1  MEWS Score Color Green  Respiratory Assessment  Assessment Type Assess only  Respiratory Pattern Regular;Unlabored  Chest Assessment Chest expansion symmetrical  Cough Non-productive  Bilateral Breath Sounds Diminished;Rhonchi  Oxygen Therapy/Pulse Ox  O2 Device (S)  HFNC  O2 Therapy Oxygen humidified  O2 Flow Rate (L/min) 12 L/min  SpO2 99 %   Pt placed on HFNC at 12L per MD note. Pt states he is not having SOB at this time. Pt swallowed nighttime meds given by RN without issues. Pt stable at this time. RT will continue to monitor as needed.

## 2023-07-05 NOTE — Progress Notes (Signed)
EGK was done and read Afib RVR.

## 2023-07-05 NOTE — Consult Note (Signed)
NAME:  Zachary Chambers, MRN:  [id number], DOB:  01-02-1951, LOS: 5 ADMISSION DATE:  07/16/2023, CONSULTATION DATE:  07/05/2023 REFERRING MD:  Leroy Sea, MD, CHIEF COMPLAINT:  respiratory failure  History of Present Illness:  Zachary Chambers is a 72 y.o. 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 Here for worsening dy spnea and hypoxemia and being treated for copd exacerbation with CAP - antibiotics and steroids. In the last 24 hours has had worsening respiratory status and been on bipap. PCCM called to evaluate for ICU admission.   I talked with his wife - he last saw Dr. Delton Coombes about a year ago. He discussed end stage lung disease with them but it appears wife didn't fully understand how sick he is. Zachary Chambers has been going down hill for many years and is functionally bed bound at home. He does not leave the house anymore.  Pertinent  Medical History  COPD on home oxygen Etoh use disorder  Significant Hospital Events: Including procedures, antibiotic start and stop dates in addition to other pertinent events     Interim History / Subjective:    Objective   Blood pressure (!) 143/80, pulse (!) 148, temperature 99.6 F (37.6 C), temperature source Oral, resp. rate 20, height 5\' 8"  (1.727 m), weight 59 kg, SpO2 90%.    FiO2 (%):  [45 %-50 %] 45 %  No intake or output data in the 24 hours ending 07/05/23 0952 Filed Weights   06/26/2023 1612  Weight: 59 kg    Examination: General: thin, cachectic, frail, weak HENT: on nasal cannula Lungs: diminished, decreased air entry bilaterally, no wheeze, tachypnic Cardiovascular: tachycardic, irregular Abdomen: scaphoid, soft, nontender Extremities: thin, no edema Neuro: moves all 4 extremities, alert but not oriented to situation  Chest xray with hyperinflation, emphysema, RUL opacities and LLL volume loss/opacities.    Resolved Hospital Problem list     Assessment & Plan:  Acute on chronic  hypoxemic and hypercapnic respiratory failure secondary to  End stage COPD with acute exacerbation CAP - continue bipap prn - has been off for about 2 hours and may need to go back on - high risk for intubation, will transfer to ICU - will need to continue to address goals of care with patient and family  - may benefit from some lasix. He did have one dose this morning. Echo is pending. Previous echo in 2022 showed pericardia effusion but preserved LVEF.   Alcohol use disorder - monitor for withdrawal signs/symptoms  Atrial fibrillation  - continue eliquis, rate control, amiodarone - tele  Acute encephalopathy - multifactorial from hypercapnia, hospital delirium - continue nocturnal seroquel - frequent reorientation  Best Practice (right click and "Reselect all SmartList Selections" daily)   Diet/type: NPO  DVT prophylaxis: DOAC Pressure ulcer(s). Clinically undetermined if the pressure injury was present on admission GI prophylaxis: N/A Lines: N/A Foley:  N/A Code Status:  full code Last date of multidisciplinary goals of care discussion [updated wife over the phone 11/18. Currently full code.]  Labs   CBC: Recent Labs  Lab 06/24/2023 1700 07/01/23 0521 07/02/23 0446 07/03/23 0353 07/04/23 0529 07/04/23 0657 07/05/23 0341  WBC 49.7*   < > 35.2* 23.3* 51.7* 54.4* 51.3*  NEUTROABS 43.3*  --   --   --   --  48.6*  --   HGB 11.9*   < > 10.9* 10.8* 11.2* 11.3* 11.7*  HCT 40.4   < > 36.1* 35.3* 38.7*  38.1* 40.5  MCV 85.4   < > 83.2 82.1 83.8 83.6 84.6  PLT 529*   < > 581* 629* 755* 741* 639*   < > = values in this interval not displayed.    Basic Metabolic Panel: Recent Labs  Lab 07/01/23 0521 07/02/23 0446 07/03/23 0353 07/04/23 0529 07/05/23 0341  NA 135 137 135 137 142  K 4.8 4.6 4.3 4.7 4.7  CL 99 101 100 100 103  CO2 26 24 24 24 25   GLUCOSE 131* 182* 129* 131* 58*  BUN 48* 62* 63* 48* 48*  CREATININE 1.91* 2.71* 2.22* 1.75* 1.77*  CALCIUM 8.5* 9.0 9.1  9.5 9.8   GFR: Estimated Creatinine Clearance: 30.6 mL/min (A) (by C-G formula based on SCr of 1.77 mg/dL (H)). Recent Labs  Lab 06/27/2023 1830 07/01/2023 2033 07/01/23 0521 07/03/23 0353 07/04/23 0529 07/04/23 0657 07/05/23 0341 07/05/23 0706  PROCALCITON  --   --   --   --   --   --   --  8.26  WBC  --   --    < > 23.3* 51.7* 54.4* 51.3*  --   LATICACIDVEN 1.9 2.2*  --   --   --   --   --   --    < > = values in this interval not displayed.    Liver Function Tests: Recent Labs  Lab 07/02/23 0446 07/03/23 0353 07/04/23 0529 07/05/23 0341  AST 41 42* 51* 48*  ALT 18 19 22 22   ALKPHOS 116 101 175* 156*  BILITOT 0.4 0.4 0.4 1.0  PROT 6.4* 6.4* 7.2 6.7  ALBUMIN 2.6* 2.6* 2.9* 2.6*   No results for input(s): "LIPASE", "AMYLASE" in the last 168 hours. No results for input(s): "AMMONIA" in the last 168 hours.  ABG    Component Value Date/Time   PHART 7.3 (L) 07/04/2023 1050   PCO2ART 61 (H) 07/04/2023 1050   PO2ART 76 (L) 07/04/2023 1050   HCO3 30.0 (H) 07/04/2023 1050   TCO2 36 (H) 07/27/2022 0659   O2SAT 96.4 07/04/2023 1050     Coagulation Profile: No results for input(s): "INR", "PROTIME" in the last 168 hours.  Cardiac Enzymes: No results for input(s): "CKTOTAL", "CKMB", "CKMBINDEX", "TROPONINI" in the last 168 hours.  HbA1C: Hgb A1c MFr Bld  Date/Time Value Ref Range Status  03/06/2020 02:24 AM 5.4 4.8 - 5.6 % Final    Comment:    (NOTE) Pre diabetes:          5.7%-6.4%  Diabetes:              >6.4%  Glycemic control for   <7.0% adults with diabetes   09/03/2017 02:55 PM 5.2 <5.7 % of total Hgb Final    Comment:    For the purpose of screening for the presence of diabetes: . <5.7%       Consistent with the absence of diabetes 5.7-6.4%    Consistent with increased risk for diabetes             (prediabetes) > or =6.5%  Consistent with diabetes . This assay result is consistent with a decreased risk of diabetes. . Currently, no consensus  exists regarding use of hemoglobin A1c for diagnosis of diabetes in children. . According to American Diabetes Association (ADA) guidelines, hemoglobin A1c <7.0% represents optimal control in non-pregnant diabetic patients. Different metrics may apply to specific patient populations.  Standards of Medical Care in Diabetes(ADA). .     CBG: Recent Labs  Lab 07/01/23 0038  GLUCAP 158*    Review of Systems:   Unable to obtain due to patient condition.   Past Medical History:  He,  has a past medical history of ACS (acute coronary syndrome) (HCC) (04/19/2021), Alcohol abuse, Aortic atherosclerosis (HCC), CAD in native artery, Chronic respiratory failure (HCC), COPD (chronic obstructive pulmonary disease) (HCC), DJD (degenerative joint disease), GERD (gastroesophageal reflux disease), Hyperlipidemia, On home O2, Paroxysmal atrial fibrillation (HCC), Pericardial effusion, Pneumonia due to COVID-19 virus, PUD (peptic ulcer disease), Rotator cuff disorder, and Stroke (cerebrum) (HCC).   Surgical History:   Past Surgical History:  Procedure Laterality Date   COLONOSCOPY     EYE SURGERY     right eye removed at age 71   heart stent     x3- 2006     Social History:   reports that he has quit smoking. His smoking use included cigarettes. He has a 40 pack-year smoking history. He has never used smokeless tobacco. He reports current alcohol use of about 30.0 standard drinks of alcohol per week. He reports that he does not currently use drugs.   Family History:  His family history includes COPD in his father; Colon cancer in his paternal grandfather; Diabetes in his maternal grandfather and paternal grandfather; Ovarian cancer in his mother. There is no history of Esophageal cancer, Rectal cancer, or Stomach cancer.   Allergies No Known Allergies   Home Medications  Prior to Admission medications   Medication Sig Start Date End Date Taking? Authorizing Provider  acetaminophen (TYLENOL)  325 MG tablet Take 2 tablets (650 mg total) by mouth every 6 (six) hours as needed for mild pain (or Fever >/= 101). 07/31/22  Yes Arrien, York Ram, MD  albuterol (PROVENTIL) (2.5 MG/3ML) 0.083% nebulizer solution Take 3 mLs (2.5 mg total) by nebulization every 4 (four) hours as needed for wheezing or shortness of breath. 10/29/22  Yes Donita Brooks, MD  apixaban (ELIQUIS) 5 MG TABS tablet Take 1 tablet (5 mg total) by mouth 2 (two) times daily. 10/15/22  Yes Donita Brooks, MD  atorvastatin (LIPITOR) 40 MG tablet Take 1 tablet (40 mg total) by mouth at bedtime. 10/15/22  Yes Donita Brooks, MD  Budeson-Glycopyrrol-Formoterol (BREZTRI AEROSPHERE) 160-9-4.8 MCG/ACT AERO Inhale 2 puffs into the lungs 2 (two) times daily. 10/15/22  Yes Donita Brooks, MD  Cholecalciferol (D3 PO) Take 1 tablet by mouth daily.   Yes [provider]  clotrimazole-betamethasone (LOTRISONE) cream APPLY TO SKIN 2 TIMES DAILY 10/15/22  Yes Donita Brooks, MD  Cyanocobalamin (VITAMIN B-12 PO) Take 1 tablet by mouth daily.   Yes [provider]  fluticasone (FLONASE) 50 MCG/ACT nasal spray Place 2 sprays into both nostrils daily. 10/15/22  Yes Donita Brooks, MD  isosorbide mononitrate (IMDUR) 30 MG 24 hr tablet Take 0.5 tablets (15 mg total) by mouth daily. 10/15/22  Yes Donita Brooks, MD  magnesium oxide (MAG-OX) 400 (240 Mg) MG tablet Take 1 tablet (400 mg total) by mouth daily. 10/15/22  Yes Donita Brooks, MD  metoprolol tartrate (LOPRESSOR) 50 MG tablet TAKE 1 TABLET BY MOUTH TWICE DAILY 04/13/23  Yes Donita Brooks, MD  Multiple Vitamin (MULTIVITAMIN WITH MINERALS) TABS tablet Take 1 tablet by mouth daily. 10/15/22  Yes Donita Brooks, MD  nitroGLYCERIN (NITROSTAT) 0.4 MG SL tablet DISSOLVE 1 TABLET UNDER THE TONGUE EVERY 5 MINUTES AS NEEDED FOR CHEST PAIN. 10/15/22  Yes Donita Brooks, MD  omeprazole (PRILOSEC) 40 MG  capsule Take 1 capsule (40 mg total) by mouth daily.  10/15/22  Yes Donita Brooks, MD  predniSONE (DELTASONE) 10 MG tablet Take 1 tablet (10 mg total) by mouth every morning. 10/15/22  Yes Donita Brooks, MD  QUEtiapine (SEROQUEL) 25 MG tablet Take 1 tablet (25 mg total) by mouth at bedtime. 10/15/22  Yes Donita Brooks, MD  Blood Glucose Calibration (TRUE METRIX LEVEL 1) Low SOLN USE AS DIRECTED. To monitor fsbs 2 times per day dx e11.9 06/02/21   Donita Brooks, MD  Blood Glucose Monitoring Suppl (TRUE METRIX AIR GLUCOSE METER) DEVI USE AS DIRECTED. To monitor fsbs 2 times per day dx e11.9 06/02/21   Donita Brooks, MD  glucose blood (RELION TRUE METRIX TEST STRIPS) test strip USE AS DIRECTED. To monitor fsbs 2 times per day dx e11.9 06/02/21   Donita Brooks, MD     Critical care time:      The patient is critically ill due to respiratory failure.  Critical care was necessary to treat or prevent imminent or life-threatening deterioration.  Critical care was time spent personally by me on the following activities: development of treatment plan with patient and/or surrogate as well as nursing, discussions with consultants, evaluation of patient's response to treatment, examination of patient, obtaining history from patient or surrogate, ordering and performing treatments and interventions, ordering and review of laboratory studies, ordering and review of radiographic studies, pulse oximetry, re-evaluation of patient's condition and participation in multidisciplinary rounds.   Critical Care Time devoted to patient care services described in this note is 55 minutes. This time reflects time of care of this signee Charlott Holler . This critical care time does not reflect separately billable procedures or procedure time, teaching time or supervisory time of PA/NP/Med student/Med Resident etc but could involve care discussion time.       Charlott Holler [facility] 07/05/2023 10:10 AM  Pager: see  AMION  If no response to pager , please call critical care on call (see AMION) until 7pm After 7:00 pm call Elink

## 2023-07-05 NOTE — Progress Notes (Signed)
RT note. Patient NTS at this time per MD request. Small amount of tan thick secretions suctioned. Patient able to cough,  Patient placed on bipap at this time per Md request as well. Patient sat 95% RT will continue to monitor.    07/05/23 1013  BiPAP/CPAP/SIPAP  BiPAP/CPAP/SIPAP Pt Type Adult  BiPAP/CPAP/SIPAP V60  Mask Type Full face mask  Mask Size Medium  Set Rate 16 breaths/min  Respiratory Rate 25 breaths/min  IPAP 18 cmH20  EPAP 6 cmH2O  FiO2 (%) 45 %  Minute Ventilation 11.8  Leak 68  Peak Inspiratory Pressure (PIP) 15  Tidal Volume (Vt) 317  Patient Home Equipment No  Auto Titrate No  BiPAP/CPAP /SiPAP Vitals  Pulse Rate 91  SpO2 95 %  MEWS Score/Color  MEWS Score 0  MEWS Score Color [place] Si

## 2023-07-05 NOTE — Progress Notes (Signed)
Dr. Margo Aye was made aware that pt had 6 sec run of SVT. Since the pt is on BiPAP, he didn't receive his metoprolol 50mg  PO tonight. Current BP=155/87. HR=123.. Metoprolol 2.5mg  was ordered and then D/C'd.

## 2023-07-05 NOTE — Plan of Care (Signed)

## 2023-07-05 NOTE — Consult Note (Addendum)
WOC Nurse Consult Note: Reason for Consult: dressings in place to lower legs with no wound care orders  Wound type: full and partial thickness skin loss ? R/t trauma vs venous insufficiency  Pressure Injury POA: NA  Measurement: 1.  Full thickness R lower anterior leg  3 cm x 1 cm x 0.1 cm 100% pink moist, superior to that wound R pretibial 0.5 cm x 1 cm full thickness that is 100% scabbed 2.  Full thickness L lower extremity 2 cm x 1 cm 50% pink moist 50% dry hemorrhagic tissue; L knee 2 cm x 1 cm 75% pink dry 25% dry brown scab   Also noted large dry scabbed areas above knees that require no wound care  Wound bed: as above  Drainage (amount, consistency, odor) dry  Periwound: dry flaky skin some evidence of old healed ulcers  Dressing procedure/placement/frequency: Clean open wounds to B lower legs with NS, apply Xeroform gauze [place] [phone]) to wound beds daily.  Cover with silicone foam or wrap with Kerlix roll gauze whichever is preferred.   These ulcers appear to be a mix of venous and some traumatic from hitting shins.    POC discussed with bedside nurse. WOC team will not follow Re-consult if further needs arise.   Thank you,    Priscella Mann MSN, RN-BC, [employer] [phone]

## 2023-07-05 NOTE — Progress Notes (Addendum)
eLink Physician-Brief Progress Note Patient Name: Zachary Chambers DOB: 03-May-1951 MRN: [id number]   Date of Service  07/05/2023  HPI/Events of Note  72 y.o. 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 Here for worsening dy spnea and hypoxemia and being treated for copd exacerbation with CAP.  Patient has been on BiPAP and intermittent high flow nasal cannula.  His numerous medications do for the nighttime.  Concerned that he is high risk for intubation.  Currently ordered for meds with sips of water.  eICU Interventions  Patient appeared alert, responsive, and appropriately coordinating his motor function.  Would temporarily place him on high flow nasal cannula and attempt to give him his evening meds.   2131 -patient previously on diltiazem 60 mg and then started on amiodarone for refractory A-fib.  Now in sinus rhythm.  Blood pressure well-controlled.  Wondering whether to maintain diltiazem.  No contraindication at this time, continue meds as previously ordered.  Intervention Category Intermediate Interventions: Respiratory distress - evaluation and management  Cleotha Tsang 07/05/2023, 9:05 PM

## 2023-07-06 ENCOUNTER — Inpatient Hospital Stay (HOSPITAL_COMMUNITY): Payer: Medicare Other

## 2023-07-06 ENCOUNTER — Other Ambulatory Visit (HOSPITAL_COMMUNITY): Payer: Medicare Other

## 2023-07-06 DIAGNOSIS — J449 Chronic obstructive pulmonary disease, unspecified: Secondary | ICD-10-CM | POA: Diagnosis not present

## 2023-07-06 DIAGNOSIS — I4891 Unspecified atrial fibrillation: Secondary | ICD-10-CM

## 2023-07-06 DIAGNOSIS — J9622 Acute and chronic respiratory failure with hypercapnia: Secondary | ICD-10-CM | POA: Diagnosis not present

## 2023-07-06 DIAGNOSIS — J9621 Acute and chronic respiratory failure with hypoxia: Secondary | ICD-10-CM | POA: Diagnosis not present

## 2023-07-06 LAB — CBC WITH DIFFERENTIAL/PLATELET
Abs Immature Granulocytes: 0 10*3/uL (ref 0.00–0.07)
Basophils Absolute: 0 10*3/uL (ref 0.0–0.1)
Basophils Relative: 0 %
Eosinophils Absolute: 0 10*3/uL (ref 0.0–0.5)
Eosinophils Relative: 0 %
HCT: 36 % — ABNORMAL LOW (ref 39.0–52.0)
Hemoglobin: 10.8 g/dL — ABNORMAL LOW (ref 13.0–17.0)
Lymphocytes Relative: 1 %
Lymphs Abs: 0.6 10*3/uL — ABNORMAL LOW (ref 0.7–4.0)
MCH: 24.1 pg — ABNORMAL LOW (ref 26.0–34.0)
MCHC: 30 g/dL (ref 30.0–36.0)
MCV: 80.4 fL (ref 80.0–100.0)
Monocytes Absolute: 3.9 10*3/uL — ABNORMAL HIGH (ref 0.1–1.0)
Monocytes Relative: 7 %
Neutro Abs: 50.8 10*3/uL — ABNORMAL HIGH (ref 1.7–7.7)
Neutrophils Relative %: 92 %
Platelets: 746 10*3/uL — ABNORMAL HIGH (ref 150–400)
RBC: 4.48 MIL/uL (ref 4.22–5.81)
RDW: 17.2 % — ABNORMAL HIGH (ref 11.5–15.5)
WBC: 55.2 10*3/uL (ref 4.0–10.5)
nRBC: 0.1 % (ref 0.0–0.2)
nRBC: 1 /100{WBCs} — ABNORMAL HIGH

## 2023-07-06 LAB — COMPREHENSIVE METABOLIC PANEL
ALT: 21 U/L (ref 0–44)
AST: 41 U/L (ref 15–41)
Albumin: 2.4 g/dL — ABNORMAL LOW (ref 3.5–5.0)
Alkaline Phosphatase: 142 U/L — ABNORMAL HIGH (ref 38–126)
Anion gap: 14 (ref 5–15)
BUN: 63 mg/dL — ABNORMAL HIGH (ref 8–23)
CO2: 23 mmol/L (ref 22–32)
Calcium: 9.3 mg/dL (ref 8.9–10.3)
Chloride: 105 mmol/L (ref 98–111)
Creatinine, Ser: 2.42 mg/dL — ABNORMAL HIGH (ref 0.61–1.24)
GFR, Estimated: 27 mL/min — ABNORMAL LOW (ref >60)
Glucose, Bld: 141 mg/dL — ABNORMAL HIGH (ref 70–99)
Potassium: 4.2 mmol/L (ref 3.5–5.1)
Sodium: 142 mmol/L (ref 135–145)
Total Bilirubin: 0.6 mg/dL (ref <1.2)
Total Protein: 6.7 g/dL (ref 6.5–8.1)

## 2023-07-06 LAB — ECHOCARDIOGRAM COMPLETE
AR max vel: 2.58 cm2
AV Area VTI: 2.4 cm2
AV Area mean vel: 2.49 cm2
AV Mean grad: 4 mm[Hg]
AV Peak grad: 7.2 mm[Hg]
Ao pk vel: 1.34 m/s
Area-P 1/2: 3.66 cm2
Height: 68 in
S' Lateral: 2.9 cm
Weight: 1823.65 [oz_av]

## 2023-07-06 LAB — C-REACTIVE PROTEIN: CRP: 37.4 mg/dL — ABNORMAL HIGH (ref <1.0)

## 2023-07-06 LAB — BRAIN NATRIURETIC PEPTIDE: B Natriuretic Peptide: 432.8 pg/mL — ABNORMAL HIGH (ref 0.0–100.0)

## 2023-07-06 LAB — GLUCOSE, CAPILLARY
Glucose-Capillary: 110 mg/dL — ABNORMAL HIGH (ref 70–99)
Glucose-Capillary: 123 mg/dL — ABNORMAL HIGH (ref 70–99)
Glucose-Capillary: 134 mg/dL — ABNORMAL HIGH (ref 70–99)
Glucose-Capillary: 134 mg/dL — ABNORMAL HIGH (ref 70–99)
Glucose-Capillary: 135 mg/dL — ABNORMAL HIGH (ref 70–99)
Glucose-Capillary: 141 mg/dL — ABNORMAL HIGH (ref 70–99)
Glucose-Capillary: 188 mg/dL — ABNORMAL HIGH (ref 70–99)

## 2023-07-06 LAB — PHOSPHORUS: Phosphorus: 3 mg/dL (ref 2.5–4.6)

## 2023-07-06 LAB — MAGNESIUM: Magnesium: 2.1 mg/dL (ref 1.7–2.4)

## 2023-07-06 LAB — PROCALCITONIN: Procalcitonin: 24.67 ng/mL

## 2023-07-06 MED ORDER — PANTOPRAZOLE SODIUM 40 MG IV SOLR
40.0000 mg | INTRAVENOUS | Status: DC
  Administered 2023-07-06 – 2023-07-07 (×2): 40 mg via INTRAVENOUS
  Filled 2023-07-06 (×2): qty 10

## 2023-07-06 MED ORDER — PROSOURCE TF20 ENFIT COMPATIBL EN LIQD
60.0000 mL | Freq: Every day | ENTERAL | Status: DC
  Administered 2023-07-06: 60 mL
  Filled 2023-07-06 (×2): qty 60

## 2023-07-06 MED ORDER — SODIUM CHLORIDE 0.9 % IV SOLN: 3.0000 g | Freq: Two times a day (BID) | INTRAVENOUS | Status: DC

## 2023-07-06 MED ORDER — LACTATED RINGERS IV BOLUS
500.0000 mL | Freq: Once | INTRAVENOUS | Status: AC
  Administered 2023-07-06: 500 mL via INTRAVENOUS

## 2023-07-06 MED ORDER — THIAMINE MONONITRATE 100 MG PO TABS
100.0000 mg | ORAL_TABLET | Freq: Every day | ORAL | Status: DC
  Administered 2023-07-06: 100 mg
  Filled 2023-07-06 (×2): qty 1

## 2023-07-06 MED ORDER — VANCOMYCIN HCL 1250 MG/250ML IV SOLN
1250.0000 mg | Freq: Once | INTRAVENOUS | Status: AC
Start: 2023-07-06 — End: 2023-07-06
  Administered 2023-07-06: 1250 mg via INTRAVENOUS
  Filled 2023-07-06: qty 250

## 2023-07-06 MED ORDER — QUETIAPINE FUMARATE 25 MG PO TABS
25.0000 mg | ORAL_TABLET | Freq: Every day | ORAL | Status: DC
Start: 2023-07-07 — End: 2023-07-07
  Administered 2023-07-06: 25 mg

## 2023-07-06 MED ORDER — ATORVASTATIN CALCIUM 40 MG PO TABS
40.0000 mg | ORAL_TABLET | Freq: Every day | ORAL | Status: DC
  Administered 2023-07-06: 40 mg

## 2023-07-06 MED ORDER — PANTOPRAZOLE SODIUM 40 MG PO TBEC: 40.0000 mg | DELAYED_RELEASE_TABLET | Freq: Every day | ORAL | Status: DC

## 2023-07-06 MED ORDER — ACETAMINOPHEN 325 MG PO TABS: 650.0000 mg | ORAL_TABLET | Freq: Four times a day (QID) | ORAL | Status: DC | PRN

## 2023-07-06 MED ORDER — DILTIAZEM HCL 60 MG PO TABS
60.0000 mg | ORAL_TABLET | Freq: Three times a day (TID) | ORAL | Status: DC
  Filled 2023-07-06: qty 1

## 2023-07-06 MED ORDER — SODIUM CHLORIDE 0.9 % IV SOLN
2.0000 g | INTRAVENOUS | Status: DC
  Administered 2023-07-06 – 2023-07-07 (×2): 2 g via INTRAVENOUS
  Filled 2023-07-06 (×2): qty 12.5

## 2023-07-06 MED ORDER — ACETAMINOPHEN 650 MG RE SUPP: 650.0000 mg | Freq: Four times a day (QID) | RECTAL | Status: DC | PRN

## 2023-07-06 MED ORDER — METOPROLOL TARTRATE 5 MG/5ML IV SOLN
15.0000 mg | Freq: Four times a day (QID) | INTRAVENOUS | Status: DC
  Filled 2023-07-06: qty 15

## 2023-07-06 MED ORDER — DILTIAZEM HCL 60 MG PO TABS
60.0000 mg | ORAL_TABLET | Freq: Three times a day (TID) | ORAL | Status: DC
  Administered 2023-07-06 – 2023-07-07 (×2): 60 mg
  Filled 2023-07-06 (×3): qty 1

## 2023-07-06 MED ORDER — OSMOLITE 1.5 CAL PO LIQD
1000.0000 mL | ORAL | Status: DC
  Administered 2023-07-06: 1000 mL
  Filled 2023-07-06: qty 1000

## 2023-07-06 MED ORDER — METOPROLOL TARTRATE 5 MG/5ML IV SOLN
5.0000 mg | Freq: Four times a day (QID) | INTRAVENOUS | Status: DC
  Administered 2023-07-06 – 2023-07-07 (×4): 5 mg via INTRAVENOUS
  Filled 2023-07-06 (×4): qty 5

## 2023-07-06 MED ORDER — LORAZEPAM 2 MG/ML IJ SOLN
1.0000 mg | Freq: Once | INTRAMUSCULAR | Status: AC
  Administered 2023-07-06: 1 mg via INTRAVENOUS
  Filled 2023-07-06: qty 1

## 2023-07-06 MED ORDER — ATORVASTATIN CALCIUM 40 MG PO TABS: 40.0000 mg | ORAL_TABLET | Freq: Every day | ORAL | Status: DC

## 2023-07-06 MED ORDER — VANCOMYCIN VARIABLE DOSE PER UNSTABLE RENAL FUNCTION (PHARMACIST DOSING): Status: DC

## 2023-07-06 MED ORDER — QUETIAPINE FUMARATE 25 MG PO TABS
25.0000 mg | ORAL_TABLET | Freq: Every day | ORAL | Status: DC
Start: 2023-07-07 — End: 2023-07-06

## 2023-07-06 MED ORDER — APIXABAN 2.5 MG PO TABS: 2.5000 mg | ORAL_TABLET | Freq: Two times a day (BID) | ORAL | Status: DC

## 2023-07-06 MED ORDER — METOPROLOL TARTRATE 25 MG PO TABS: 100.0000 mg | ORAL_TABLET | Freq: Two times a day (BID) | ORAL | Status: DC

## 2023-07-06 MED ORDER — APIXABAN 2.5 MG PO TABS
2.5000 mg | ORAL_TABLET | Freq: Two times a day (BID) | ORAL | Status: DC
  Administered 2023-07-06: 2.5 mg
  Filled 2023-07-06: qty 1

## 2023-07-06 MED ORDER — FREE WATER
200.0000 mL | Freq: Four times a day (QID) | Status: DC
  Administered 2023-07-06 – 2023-07-07 (×3): 200 mL

## 2023-07-06 NOTE — Progress Notes (Addendum)
NAME:  Zachary Chambers, MRN:  [id number], DOB:  26-Dec-1950, LOS: 6 ADMISSION DATE:  07/11/2023, CONSULTATION DATE:  07/05/2023 REFERRING MD:  Leroy Sea, MD  CHIEF COMPLAINT:  Respiratory Failure    History of Present Illness:  Zachary Chambers is a 72 y.o. 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 Here for worsening dyspnea and hypoxemia and being treated for copd exacerbation with CAP - antibiotics and steroids. In the last 24 hours has had worsening respiratory status and been on bipap. PCCM called to evaluate for ICU admission.   Pertinent  Medical History  COPD on home oxygen Etoh use disorder Significant Hospital Events: Including procedures, antibiotic start and stop dates in addition to other pertinent events   11/19: Patient required BiPAP for tachycardia and tachypnea  11/19: Started cefepime and vancomycin, d/c Unasyn   Interim History / Subjective:   Objective   Blood pressure 124/65, pulse (!) 113, temperature (!) 100.7 F (38.2 C), temperature source Axillary, resp. rate (!) 21, height 5\' 8"  (1.727 m), weight 51.7 kg, SpO2 93%.    FiO2 (%):  [35 %-45 %] 45 %   Intake/Output Summary (Last 24 hours) at 07/06/2023 0917 Last data filed at 07/06/2023 0900 Gross per 24 hour  Intake 993 ml  Output 1200 ml  Net -207 ml   Filed Weights   06/27/2023 1612 07/05/23 1200  Weight: 59 kg 51.7 kg    Examination: General: Laying in bed HENT: BiPAP mask in place  Lungs: No wheezes or rhonchus appreciated Cardiovascular: Tachycardia, regular  Abdomen: Soft, bowel sounds present  Extremities: moving all four extremities bilaterally, no pitting edema on B/L LE Neuro: Patient unable to follow commands, will not open his eyes Skin: warm and dry, no rashes noted   Resolved Hospital Problem list     Assessment & Plan:  Acute on Chronic hypoxic and hypercapnic respiratory failure secondary to CAP and COPD exacerbation.  His respiratory  status worsened since admission to the ICU. Per chart review, he was able to tolerate HFNC until 2AM when he became tachycardic and tachypneic and he was placed on BiPAP with an increase in EPAP to 10. His MRSA swab is positive and his pro-calcitonin increased to 24. 67 from 8.26.  Plan: -Will discontinue Unasyn and begin cefepime and Vancomycin renal dosing per pharmacy for MRSA coverage as well as clinical worsening of respiratory function and fever with an increase in leukocytosis and pro-calcitonin  -Continue Solumedrol 60 mg, day 2  -Continue Duonebs q2 hours prn  -Continue BiPAP    Atrial Fibrillation -Will continue Eliquis 2.5 mg BID for AC -Continue Amiodarone infusion  -lopressor to 5 mg q6 hours IV  -Continue Cardizem 60mg  q8hours   Acute encephalopathy  -Likely due to Hypercapnia, Ativan is also likely contributing to his poor cognition -Continue BiPAP   AKI Patient's renal function has worsened from a serum creatinine of 1.77-2.42.  Possibly prerenal due to 40 mg of Lasix injection patient received yesterday.   -Will hold off on diuresing at this moment, patient appears euvolemic on exam.   -Will follow-up on echocardiogram later this afternoon.  Best Practice (right click and "Reselect all SmartList Selections" daily)   Diet/type: NPO DVT prophylaxis: DOAC Pressure ulcer(s):Clinically undetermined if the pressure injury was present on admission  GI prophylaxis: PPI IV  Lines: N/A Foley:  N/A Code Status:  full code Last date of multidisciplinary goals of care discussion [11/18]  Labs  CBC: Recent Labs  Lab 06/25/2023 1700 07/01/23 0521 07/03/23 0353 07/04/23 0529 07/04/23 0657 07/05/23 0341 07/06/23 0336  WBC 49.7*   < > 23.3* 51.7* 54.4* 51.3* 55.2*  NEUTROABS 43.3*  --   --   --  48.6*  --  50.8*  HGB 11.9*   < > 10.8* 11.2* 11.3* 11.7* 10.8*  HCT 40.4   < > 35.3* 38.7* 38.1* 40.5 36.0*  MCV 85.4   < > 82.1 83.8 83.6 84.6 80.4  PLT 529*   < > 629* 755*  741* 639* 746*   < > = values in this interval not displayed.    Basic Metabolic Panel: Recent Labs  Lab 07/02/23 0446 07/03/23 0353 07/04/23 0529 07/05/23 0341 07/06/23 0336  NA 137 135 137 142 142  K 4.6 4.3 4.7 4.7 4.2  CL 101 100 100 103 105  CO2 24 24 24 25 23   GLUCOSE 182* 129* 131* 58* 141*  BUN 62* 63* 48* 48* 63*  CREATININE 2.71* 2.22* 1.75* 1.77* 2.42*  CALCIUM 9.0 9.1 9.5 9.8 9.3  MG  --   --   --   --  2.1  PHOS  --   --   --   --  3.0   GFR: Estimated Creatinine Clearance: 19.6 mL/min (A) (by C-G formula based on SCr of 2.42 mg/dL (H)). Recent Labs  Lab 07/02/2023 1830 07/11/2023 2033 07/01/23 0521 07/04/23 0529 07/04/23 0657 07/05/23 0341 07/05/23 0706 07/06/23 0336  PROCALCITON  --   --   --   --   --   --  8.26 24.67  WBC  --   --    < > 51.7* 54.4* 51.3*  --  55.2*  LATICACIDVEN 1.9 2.2*  --   --   --   --   --   --    < > = values in this interval not displayed.    Liver Function Tests: Recent Labs  Lab 07/02/23 0446 07/03/23 0353 07/04/23 0529 07/05/23 0341 07/06/23 0336  AST 41 42* 51* 48* 41  ALT 18 19 22 22 21   ALKPHOS 116 101 175* 156* 142*  BILITOT 0.4 0.4 0.4 1.0 0.6  PROT 6.4* 6.4* 7.2 6.7 6.7  ALBUMIN 2.6* 2.6* 2.9* 2.6* 2.4*   No results for input(s): "LIPASE", "AMYLASE" in the last 168 hours. No results for input(s): "AMMONIA" in the last 168 hours.  ABG    Component Value Date/Time   PHART 7.3 (L) 07/04/2023 1050   PCO2ART 61 (H) 07/04/2023 1050   PO2ART 76 (L) 07/04/2023 1050   HCO3 30.0 (H) 07/04/2023 1050   TCO2 36 (H) 07/27/2022 0659   O2SAT 96.4 07/04/2023 1050     Coagulation Profile: No results for input(s): "INR", "PROTIME" in the last 168 hours.  Cardiac Enzymes: No results for input(s): "CKTOTAL", "CKMB", "CKMBINDEX", "TROPONINI" in the last 168 hours.  HbA1C: Hgb A1c MFr Bld  Date/Time Value Ref Range Status  07/05/2023 12:39 PM 5.7 (H) 4.8 - 5.6 % Final    Comment:    (NOTE) Pre diabetes:           5.7%-6.4%  Diabetes:              >6.4%  Glycemic control for   <7.0% adults with diabetes   03/06/2020 02:24 AM 5.4 4.8 - 5.6 % Final    Comment:    (NOTE) Pre diabetes:          5.7%-6.4%  Diabetes:              >  6.4%  Glycemic control for   <7.0% adults with diabetes     CBG: Recent Labs  Lab 07/05/23 1936 07/05/23 2354 07/06/23 0349 07/06/23 0436 07/06/23 0816  GLUCAP 138* 152* 134* 141* 123*    Review of Systems:   Pt unable to participate due to BiPAP/cognition   Past Medical History:  He,  has a past medical history of ACS (acute coronary syndrome) (HCC) (04/19/2021), Alcohol abuse, Aortic atherosclerosis (HCC), CAD in native artery, Chronic respiratory failure (HCC), COPD (chronic obstructive pulmonary disease) (HCC), DJD (degenerative joint disease), GERD (gastroesophageal reflux disease), Hyperlipidemia, On home O2, Paroxysmal atrial fibrillation (HCC), Pericardial effusion, Pneumonia due to COVID-19 virus, PUD (peptic ulcer disease), Rotator cuff disorder, and Stroke (cerebrum) (HCC).   Surgical History:   Past Surgical History:  Procedure Laterality Date   COLONOSCOPY     EYE SURGERY     right eye removed at age 57   heart stent     x3- 2006     Social History:   reports that he has quit smoking. His smoking use included cigarettes. He has a 40 pack-year smoking history. He has never used smokeless tobacco. He reports current alcohol use of about 30.0 standard drinks of alcohol per week. He reports that he does not currently use drugs.   Family History:  His family history includes COPD in his father; Colon cancer in his paternal grandfather; Diabetes in his maternal grandfather and paternal grandfather; Ovarian cancer in his mother. There is no history of Esophageal cancer, Rectal cancer, or Stomach cancer.   Allergies No Known Allergies   Home Medications  Prior to Admission medications   Medication Sig Start Date End Date Taking? Authorizing  Provider  acetaminophen (TYLENOL) 325 MG tablet Take 2 tablets (650 mg total) by mouth every 6 (six) hours as needed for mild pain (or Fever >/= 101). 07/31/22  Yes Arrien, York Ram, MD  albuterol (PROVENTIL) (2.5 MG/3ML) 0.083% nebulizer solution Take 3 mLs (2.5 mg total) by nebulization every 4 (four) hours as needed for wheezing or shortness of breath. 10/29/22  Yes Donita Brooks, MD  apixaban (ELIQUIS) 5 MG TABS tablet Take 1 tablet (5 mg total) by mouth 2 (two) times daily. 10/15/22  Yes Donita Brooks, MD  atorvastatin (LIPITOR) 40 MG tablet Take 1 tablet (40 mg total) by mouth at bedtime. 10/15/22  Yes Donita Brooks, MD  Budeson-Glycopyrrol-Formoterol (BREZTRI AEROSPHERE) 160-9-4.8 MCG/ACT AERO Inhale 2 puffs into the lungs 2 (two) times daily. 10/15/22  Yes Donita Brooks, MD  Cholecalciferol (D3 PO) Take 1 tablet by mouth daily.   Yes [provider]  clotrimazole-betamethasone (LOTRISONE) cream APPLY TO SKIN 2 TIMES DAILY 10/15/22  Yes Donita Brooks, MD  Cyanocobalamin (VITAMIN B-12 PO) Take 1 tablet by mouth daily.   Yes [provider]  fluticasone (FLONASE) 50 MCG/ACT nasal spray Place 2 sprays into both nostrils daily. 10/15/22  Yes Donita Brooks, MD  isosorbide mononitrate (IMDUR) 30 MG 24 hr tablet Take 0.5 tablets (15 mg total) by mouth daily. 10/15/22  Yes Donita Brooks, MD  magnesium oxide (MAG-OX) 400 (240 Mg) MG tablet Take 1 tablet (400 mg total) by mouth daily. 10/15/22  Yes Donita Brooks, MD  metoprolol tartrate (LOPRESSOR) 50 MG tablet TAKE 1 TABLET BY MOUTH TWICE DAILY 04/13/23  Yes Donita Brooks, MD  Multiple Vitamin (MULTIVITAMIN WITH MINERALS) TABS tablet Take 1 tablet by mouth daily. 10/15/22  Yes Donita Brooks, MD  nitroGLYCERIN (  NITROSTAT) 0.4 MG SL tablet DISSOLVE 1 TABLET UNDER THE TONGUE EVERY 5 MINUTES AS NEEDED FOR CHEST PAIN. 10/15/22  Yes Donita Brooks, MD  omeprazole (PRILOSEC) 40 MG capsule Take 1 capsule  (40 mg total) by mouth daily. 10/15/22  Yes Donita Brooks, MD  predniSONE (DELTASONE) 10 MG tablet Take 1 tablet (10 mg total) by mouth every morning. 10/15/22  Yes Donita Brooks, MD  QUEtiapine (SEROQUEL) 25 MG tablet Take 1 tablet (25 mg total) by mouth at bedtime. 10/15/22  Yes Donita Brooks, MD  Blood Glucose Calibration (TRUE METRIX LEVEL 1) Low SOLN USE AS DIRECTED. To monitor fsbs 2 times per day dx e11.9 06/02/21   Donita Brooks, MD  Blood Glucose Monitoring Suppl (TRUE METRIX AIR GLUCOSE METER) DEVI USE AS DIRECTED. To monitor fsbs 2 times per day dx e11.9 06/02/21   Donita Brooks, MD  glucose blood (RELION TRUE METRIX TEST STRIPS) test strip USE AS DIRECTED. To monitor fsbs 2 times per day dx e11.9 06/02/21   Donita Brooks, MD

## 2023-07-06 NOTE — Progress Notes (Signed)
OT Cancellation Note  Patient Details Name: Zachary Chambers MRN: [id number] DOB: [birth year]   Cancelled Treatment:    Reason Eval/Treat Not Completed: Medical issues which prohibited therapy. BiPAP for tachycardia and tachypnea   Suzanna Obey 07/06/2023, 1:57 PM 07/06/2023  RP, OTR/L  Acute Rehabilitation Services  Office:  [phone]

## 2023-07-06 NOTE — Progress Notes (Signed)
Pharmacy Antibiotic Note  Zachary Chambers is a 72 y.o. male admitted on 07/17/2023 with pneumonia.  Pharmacy has been consulted for vancomycin dosing, given worsening respiratory status with rising WBC, PCT and positive MRSA PCR.   Plan: Vancomycin 1250mg  IV x1 then variable dosing per renal function given severity of AKI -levels PRN Per protocol , goal trough 15-69mcg/mL -vancomycin random level in AM F/u clinical picture, cultures, CBC, renal function  Height: 5\' 8"  (172.7 cm) Weight: 51.7 kg (113 lb 15.7 oz) IBW/kg (Calculated) : 68.4  Temp (24hrs), Avg:99 F (37.2 C), Min:98.1 F (36.7 C), Nigel:100.7 F (38.2 C)  Recent Labs  Lab 06/25/2023 1830 07/17/2023 2033 07/01/23 0521 07/02/23 0446 07/03/23 0353 07/04/23 0529 07/04/23 0657 07/05/23 0341 07/06/23 0336  WBC  --   --    < > 35.2* 23.3* 51.7* 54.4* 51.3* 55.2*  CREATININE  --   --    < > 2.71* 2.22* 1.75*  --  1.77* 2.42*  LATICACIDVEN 1.9 2.2*  --   --   --   --   --   --   --    < > = values in this interval not displayed.    Estimated Creatinine Clearance: 19.6 mL/min (A) (by C-G formula based on SCr of 2.42 mg/dL (H)).    No Known Allergies  Antimicrobials this admission: Cefepime x1 on 11/13 Azith x1 on 11/13 Cefdinir 11/14 >>11/18 Doxy PO 11/14 >>11/18 Unasyn 11/18> Vanc 11/19>   Dose adjustments this admission:   Microbiology results: 11/13 blood: 1/3 staph species 11/13 Resp panel: neg 11/13 COVID: neg 11/17 blood: ngtd 11/18 MRSA pos  Thank you for allowing pharmacy to be a part of this patient's care.  Calton Dach, PharmD, BCCCP Clinical Pharmacist 07/06/2023 8:33 AM

## 2023-07-06 NOTE — Progress Notes (Signed)
Discussed code status with Wife who would like to change patient's code status to DNR: No CPR or compressions and No intubation.

## 2023-07-06 NOTE — Progress Notes (Signed)
PT Cancellation Note  Patient Details Name: Zachary Chambers MRN: [id number] DOB: 03/11/1951   Cancelled Treatment:    Reason Eval/Treat Not Completed: Medical issues which prohibited therapy  Patient currently on BiPAP. Will continue to follow and progress as medically appropriate.    Jerolyn Center, PT Acute Rehabilitation Services  Office [phone]  Zena Amos 07/06/2023, 3:33 PM

## 2023-07-06 NOTE — Progress Notes (Signed)
   07/06/23 0150  BiPAP/CPAP/SIPAP  $ Non-Invasive Ventilator  Non-Invasive Vent Subsequent  BiPAP/CPAP/SIPAP Pt Type Adult  BiPAP/CPAP/SIPAP V60  Mask Type Full face mask  Mask Size Medium  Set Rate 18 breaths/min  Respiratory Rate 20 breaths/min  IPAP 18 cmH20  EPAP 6 cmH2O  FiO2 (%) 45 %  Minute Ventilation 11.2  Leak 54  Peak Inspiratory Pressure (PIP) 19  Tidal Volume (Vt) 575  Patient Home Equipment No  Auto Titrate No  Press High Alarm 30 cmH2O  Press Low Alarm 5 cmH2O  BiPAP/CPAP /SiPAP Vitals  Pulse Rate 95  Resp 18  SpO2 95 %  Bilateral Breath Sounds Diminished  MEWS Score/Color  MEWS Score 0  MEWS Score Color Green   Pt placed back on bipap for increased WOB.

## 2023-07-06 NOTE — Progress Notes (Signed)
SLP Cancellation Note  Patient Details Name: Zachary Chambers MRN: [id number] DOB: [birth year]   Cancelled treatment:       Reason Eval/Treat Not Completed: Patient not medically ready. Pt on BiPAP, talked to MD. Will d/c order and await order when ready   Kamori Kitchens, Riley Nearing 07/06/2023, 8:57 AM

## 2023-07-06 NOTE — Progress Notes (Signed)
Initial Nutrition Assessment  DOCUMENTATION CODES:  Severe malnutrition in context of chronic illness, Underweight  INTERVENTION:  Initiate tube feeding via NGT: Osmolite 1.5 at 45 ml/h (1080 ml per day) Hold at trickle rate of 15 and do not advance until able to place post-pyloric tube 11/20 Once able to advance, increase by 10 q12h to goal Prosource TF20 60 ml 1x/d Provides 1700 kcal, 88 gm protein, 823 ml free water daily Monitor magnesium and phosphorus every 12 hours x 4 occurrences, MD to replete as needed, as Zachary Chambers is at risk for refeeding syndrome given severe malnutrition and underweight BMI. Thiamine 100mg  x 5 days Cortrak placement 11/20  NUTRITION DIAGNOSIS:   Severe Malnutrition related to chronic illness (COPD) as evidenced by severe muscle depletion, severe fat depletion.  GOAL:   Patient will meet greater than or equal to 90% of their needs  MONITOR:   Diet advancement, TF tolerance, I & O's, Labs  REASON FOR ASSESSMENT:    (low BMI)    ASSESSMENT:  Zachary Chambers with hx of COPD on home O2, GERD, HLD, atrial fibrillation, EtOH abuse, CAD, and hx of stroke with left sided deficits presented to ED with worsening SOB  11/13 - presented to ED with CAP 11/17 - placed on BiPAP 11/18 - transferred to ICU  Zachary Chambers resting in bed on BiPAP at the time of assessment, unable to provide a hx. Wife at bedside able to answer nutrition questions. States that over the last 2 years Zachary Chambers has lost 59 lbs. However, reports that he has a great appetite and eats 2 meals a day. States she primarily does the cooking for patient at home and that he also will drink carnation instant breakfast, does not tolerate ensure.   Discussed plan with RN and MD. NGT was placed earlier as Zachary Chambers had an aspiration event on the floor and is unable to come of BiPAP to eat and take medications. Wife requests that feeds be started via NGT. Will initiate at a trickle. Zachary Chambers is at high risk for refeeding. Will also replaced NGT for  post-pyloric cortrak tube on 11/20.   Admit weight: 59 kg (likely inaccurate) Current weight: 51.7 kg 12.4% weight loss noted in the last ~4 months (since the end of June).    Intake/Output Summary (Last 24 hours) at 07/06/2023 1545 Last data filed at 07/06/2023 1500 Gross per 24 hour  Intake 1072.18 ml  Output 400 ml  Net 672.18 ml  Net IO Since Admission: 1,973.99 mL [07/06/23 1545]  Average Meal Intake: 11/14-11/16: 28% intake x 5 recorded meals  Nutritionally Relevant Medications: Scheduled Meds:  atorvastatin  40 mg QHS   insulin aspart  0-9 Units Q4H   methylPREDNISolone (SOLU-MEDROL) injection  60 mg Daily   pantoprazole IV  40 mg Q24H   vancomycin    See admin instructions   Continuous Infusions:  ceFEPime (MAXIPIME) IV Stopped (07/06/23 1026)   Labs Reviewed: BUN 63, creatinine 2.42 CBG ranges from 110-141 mg/dL over the last 24 hours HgbA1c 5.7%  NUTRITION - FOCUSED PHYSICAL EXAM: Flowsheet Row Most Recent Value  Orbital Region Severe depletion  Upper Arm Region Severe depletion  Thoracic and Lumbar Region Severe depletion  Buccal Region Severe depletion  Temple Region Severe depletion  Clavicle Bone Region Severe depletion  Clavicle and Acromion Bone Region Severe depletion  Scapular Bone Region Severe depletion  Dorsal Hand Severe depletion  Patellar Region Severe depletion  Anterior Thigh Region Severe depletion  Posterior Calf Region Severe depletion  Edema (RD  Assessment) None  Hair Reviewed  Eyes Reviewed  Mouth Reviewed  Skin Reviewed  Nails Reviewed    Diet Order:   Diet Order             Diet NPO time specified Except for: Sips with Meds  Diet effective now                  EDUCATION NEEDS:  Education needs have been addressed  Skin:  Skin Assessment: Reviewed RN Assessment Skin Tear: - Right tibia - left tibia Stage 1 sacrum  Last BM:  unsure  Height:  Ht Readings from Last 1 Encounters:  06/29/2023 5\' 8"  (1.727 m)     Weight:  Wt Readings from Last 1 Encounters:  07/05/23 51.7 kg    Ideal Body Weight:  70 kg  BMI:  Body mass index is 17.33 kg/m.  Estimated Nutritional Needs:  Kcal:  1600-1800 kcal/d Protein:  75-90g/d Fluid:  1.8L/d    [name], RD, LDN Registered Dietitian II RD pager # available in [facility]  After hours/weekend pager # available in [facility]

## 2023-07-07 ENCOUNTER — Inpatient Hospital Stay (HOSPITAL_COMMUNITY): Payer: Medicare Other

## 2023-07-07 DIAGNOSIS — I4891 Unspecified atrial fibrillation: Secondary | ICD-10-CM | POA: Diagnosis not present

## 2023-07-07 DIAGNOSIS — N179 Acute kidney failure, unspecified: Secondary | ICD-10-CM

## 2023-07-07 DIAGNOSIS — J189 Pneumonia, unspecified organism: Secondary | ICD-10-CM | POA: Diagnosis not present

## 2023-07-07 DIAGNOSIS — J9622 Acute and chronic respiratory failure with hypercapnia: Secondary | ICD-10-CM | POA: Diagnosis not present

## 2023-07-07 LAB — CBC WITH DIFFERENTIAL/PLATELET
Abs Immature Granulocytes: 0 10*3/uL (ref 0.00–0.07)
Basophils Absolute: 0 10*3/uL (ref 0.0–0.1)
Basophils Relative: 0 %
Eosinophils Absolute: 0 10*3/uL (ref 0.0–0.5)
Eosinophils Relative: 0 %
HCT: 34.9 % — ABNORMAL LOW (ref 39.0–52.0)
Hemoglobin: 10.4 g/dL — ABNORMAL LOW (ref 13.0–17.0)
Lymphocytes Relative: 1 %
Lymphs Abs: 0.7 10*3/uL (ref 0.7–4.0)
MCH: 24.5 pg — ABNORMAL LOW (ref 26.0–34.0)
MCHC: 29.8 g/dL — ABNORMAL LOW (ref 30.0–36.0)
MCV: 82.3 fL (ref 80.0–100.0)
Monocytes Absolute: 2.7 10*3/uL — ABNORMAL HIGH (ref 0.1–1.0)
Monocytes Relative: 4 %
Neutro Abs: 63.4 10*3/uL — ABNORMAL HIGH (ref 1.7–7.7)
Neutrophils Relative %: 95 %
Platelets: 703 10*3/uL — ABNORMAL HIGH (ref 150–400)
RBC: 4.24 MIL/uL (ref 4.22–5.81)
RDW: 17.7 % — ABNORMAL HIGH (ref 11.5–15.5)
WBC Morphology: INCREASED
WBC: 66.7 10*3/uL (ref 4.0–10.5)
nRBC: 0 /100{WBCs}
nRBC: 0.1 % (ref 0.0–0.2)

## 2023-07-07 LAB — GLUCOSE, CAPILLARY
Glucose-Capillary: 148 mg/dL — ABNORMAL HIGH (ref 70–99)
Glucose-Capillary: 169 mg/dL — ABNORMAL HIGH (ref 70–99)
Glucose-Capillary: 147 mg/dL — ABNORMAL HIGH (ref 70–99)

## 2023-07-07 LAB — BASIC METABOLIC PANEL
Anion gap: 16 — ABNORMAL HIGH (ref 5–15)
BUN: 92 mg/dL — ABNORMAL HIGH (ref 8–23)
CO2: 22 mmol/L (ref 22–32)
Calcium: 9.3 mg/dL (ref 8.9–10.3)
Chloride: 104 mmol/L (ref 98–111)
Creatinine, Ser: 3.46 mg/dL — ABNORMAL HIGH (ref 0.61–1.24)
GFR, Estimated: 18 mL/min — ABNORMAL LOW (ref >60)
Glucose, Bld: 170 mg/dL — ABNORMAL HIGH (ref 70–99)
Potassium: 4.1 mmol/L (ref 3.5–5.1)
Sodium: 142 mmol/L (ref 135–145)

## 2023-07-07 LAB — MAGNESIUM: Magnesium: 2.3 mg/dL (ref 1.7–2.4)

## 2023-07-07 LAB — VANCOMYCIN, RANDOM: Vancomycin Rm: 18 ug/mL

## 2023-07-07 LAB — C-REACTIVE PROTEIN: CRP: 40.3 mg/dL — ABNORMAL HIGH (ref <1.0)

## 2023-07-07 LAB — PHOSPHORUS: Phosphorus: 3.7 mg/dL (ref 2.5–4.6)

## 2023-07-07 LAB — BRAIN NATRIURETIC PEPTIDE: B Natriuretic Peptide: 638.2 pg/mL — ABNORMAL HIGH (ref 0.0–100.0)

## 2023-07-07 MED ORDER — GLYCOPYRROLATE 0.2 MG/ML IJ SOLN: 0.2000 mg | INTRAMUSCULAR | Status: DC | PRN

## 2023-07-07 MED ORDER — POLYVINYL ALCOHOL 1.4 % OP SOLN: 1.0000 [drp] | Freq: Four times a day (QID) | OPHTHALMIC | Status: DC | PRN

## 2023-07-07 MED ORDER — MORPHINE BOLUS VIA INFUSION
5.0000 mg | INTRAVENOUS | Status: DC | PRN
Start: 2023-07-07 — End: 2023-07-08
  Administered 2023-07-07: 5 mg via INTRAVENOUS

## 2023-07-07 MED ORDER — OSMOLITE 1.5 CAL PO LIQD
1000.0000 mL | ORAL | Status: DC
  Filled 2023-07-07: qty 1000

## 2023-07-07 MED ORDER — ACETAMINOPHEN 325 MG PO TABS: 650.0000 mg | ORAL_TABLET | Freq: Four times a day (QID) | ORAL | Status: DC | PRN

## 2023-07-07 MED ORDER — MORPHINE 100MG IN NS 100ML (1MG/ML) PREMIX INFUSION
0.0000 mg/h | INTRAVENOUS | Status: DC
  Administered 2023-07-07: 5 mg/h via INTRAVENOUS
  Filled 2023-07-07: qty 100

## 2023-07-07 MED ORDER — ACETAMINOPHEN 650 MG RE SUPP: 650.0000 mg | Freq: Four times a day (QID) | RECTAL | Status: DC | PRN

## 2023-07-07 MED ORDER — GLYCOPYRROLATE 1 MG PO TABS: 1.0000 mg | ORAL_TABLET | ORAL | Status: DC | PRN

## 2023-07-07 MED ORDER — METOCLOPRAMIDE HCL 5 MG/ML IJ SOLN
10.0000 mg | Freq: Once | INTRAMUSCULAR | Status: AC
  Administered 2023-07-07: 10 mg via INTRAVENOUS
  Filled 2023-07-07: qty 2

## 2023-07-07 MED ORDER — MIDAZOLAM HCL 2 MG/2ML IJ SOLN: 2.0000 mg | INTRAMUSCULAR | Status: DC | PRN

## 2023-07-07 MED ORDER — ORAL CARE MOUTH RINSE
15.0000 mL | OROMUCOSAL | Status: DC | PRN
Start: 2023-07-07 — End: 2023-07-07

## 2023-07-09 LAB — CULTURE, BLOOD (ROUTINE X 2)
Culture: NO GROWTH
Culture: NO GROWTH
Special Requests: ADEQUATE
Special Requests: ADEQUATE

## 2023-07-13 NOTE — Telephone Encounter (Signed)
error 

## 2023-07-14 ENCOUNTER — Telehealth: Payer: Self-pay

## 2023-07-14 NOTE — Telephone Encounter (Signed)
Copied from CRM [id number]. Topic: General - Other >> Jul 13, 2023  5:18 PM Tiffany H wrote: Reason for CRM: Patient's wife Artorius Egeland called to request that Germaine Pomfret look up the names of the patient's medical equipment company. She would like to have them arranged to be picked up. She isn't sure about the names of the companies. Please assist.   [phone]

## 2023-07-18 NOTE — Procedures (Signed)
Cortrak  Person Inserting Tube:  Mahala Menghini, RD Tube Type:  Cortrak - 55 inches Tube Size:  10 Tube Location:  Left nare Secured by: Bridle Technique Used to Measure Tube Placement:  Marking at nare/corner of mouth Cortrak Secured At:  95 cm   Cortrak Tube Team Note:  Consult received to place a Cortrak feeding tube.   X-ray is required, abdominal x-ray has been ordered by the Cortrak team. Please confirm tube placement before using the Cortrak tube.   If the tube becomes dislodged please keep the tube and contact the Cortrak team at [email or web address] for replacement.  If after hours and replacement cannot be delayed, place a NG tube and confirm placement with an abdominal x-ray.    Mertie Clause, MS, RD, LDN Registered Dietitian II Please see [facility] for contact information.

## 2023-07-18 NOTE — TOC Initial Note (Signed)
Transition of Care [facility]) - Initial/Assessment Note    Patient Details  Name: Zachary Chambers MRN: [id number] Date of Birth: 1950/12/02  Transition of Care [facility]) CM/SW Contact:    Marliss Coots, LCSW Phone Number: 06/20/2023, 11:37 AM  Clinical Narrative:                  Comfort care per bedside RN at today's progression. TOC will continue to follow.    Barriers to Discharge: Continued Medical Work up   Patient Goals and CMS Choice            Expected Discharge Plan and Services In-house Referral: Clinical Social Work                                            Prior Living Arrangements/Services     Patient language and need for interpreter reviewed:: Yes        Need for Family Participation in Patient Care: Yes (Comment) Care giver support system in place?: Yes (comment)   Criminal Activity/Legal Involvement Pertinent to Current Situation/Hospitalization: No - Comment as needed  Activities of Daily Living   ADL Screening (condition at time of admission) Independently performs ADLs?: Yes (appropriate for developmental age) Is the patient deaf or have difficulty hearing?: Yes Does the patient have difficulty seeing, even when wearing glasses/contacts?: Yes Does the patient have difficulty concentrating, remembering, or making decisions?: No  Permission Sought/Granted      Share Information with NAME: Tarri Abernethy     Permission granted to share info w Relationship: Spouse  Permission granted to share info w Contact Information: [phone]  Emotional Assessment Appearance:: Appears stated age Attitude/Demeanor/Rapport: Unable to Assess Affect (typically observed): Unable to Assess   Alcohol / Substance Use: Not Applicable Psych Involvement: No (comment)  Admission diagnosis:  COPD exacerbation (HCC) [J44.1] CAP (community acquired pneumonia) [J18.9] AKI (acute kidney injury) (HCC) [N17.9] Patient Active Problem List   Diagnosis Date  Noted   Septic shock (HCC) 07/02/2023   Pressure injury of skin 07/02/2023   CAP (community acquired pneumonia) 06/18/2023   Malnutrition of moderate degree 07/29/2022   Sepsis due to gram-negative UTI (HCC) 07/27/2022   FTT (failure to thrive) in adult with weakness and frequent falls 07/27/2022   multiple skin tears from falls and early sacral pressure ulcer 07/27/2022   Depression 11/05/2021   PSVT (paroxysmal supraventricular tachycardia) (HCC) 11/04/2021   Primary hypertension    Hypokalemia 03/12/2021   Anxiety 10/07/2020   History of CVA with residual deficit 10/07/2020   Chronic respiratory failure with hypoxia and hypercapnia (HCC) - 2 L/min at rest, 5 L/min with activity, baseline POC2 50 mmHg 07/05/2020   PAF (paroxysmal atrial fibrillation) (HCC) 07/05/2020   Dyspnea 07/04/2020   Protein-calorie malnutrition, severe 03/07/2020   S/P coronary artery stent placement    Hyperlipidemia 03/06/2020   Memory changes 03/04/2018   Bruit 01/22/2014   COPD (chronic obstructive pulmonary disease) (HCC) 01/14/2011   CAD (coronary artery disease) 01/14/2011   GERD (gastroesophageal reflux disease) 01/14/2011   TOBACCO ABUSE 07/25/2009   Osteoarthritis 07/25/2009   Disorder of bursae and tendons in shoulder region 07/25/2009   PCP:  Donita Brooks, MD Pharmacy:   [facility] [place], [place] - [address] [place] [address] Phone: [phone] Fax: [phone]  [facility] - [place], [place] -  [address] [place] [address] Phone: [phone] Fax: [phone]  [id number] - [place], [place] - [address] [facility] [address] [place] [address] Phone: [phone] Fax: [phone]  [facility] [address] [place] [address] Phone: [phone] Fax: [phone]     Social  Determinants of Health (SDOH) Social History: SDOH Screenings   Food Insecurity: Food Insecurity Present (07/02/2023)  Housing: Medium Risk (07/02/2023)  Transportation Needs: No Transportation Needs (07/02/2023)  Utilities: Not At Risk (07/02/2023)  Alcohol Screen: Low Risk  (08/07/2022)  Depression (PHQ2-9): Low Risk  (10/15/2022)  Financial Resource Strain: Low Risk  (08/07/2022)  Physical Activity: Inactive (08/07/2022)  Social Connections: Socially Isolated (08/07/2022)  Stress: No Stress Concern Present (08/07/2022)  Tobacco Use: Medium Risk (06/23/2023)   SDOH Interventions:     Readmission Risk Interventions    03/13/2021    1:35 PM  Readmission Risk Prevention Plan  Transportation Screening Complete  Home Care Screening Complete  Medication Review (RN CM) Complete

## 2023-07-18 NOTE — Progress Notes (Signed)
OT Cancellation Note  Patient Details Name: Zachary Chambers MRN: [id number] DOB: 24-Jul-1951   Cancelled Treatment:    Reason Eval/Treat Not Completed: Medical issues which prohibited therapy. Pt unable to participate in therapy, unresponsive to commands per RN and on Bipap.  Evern Bio 07/17/2023, 11:41 AM Berna Spare, OTR/L Acute Rehabilitation Services Office: [phone]

## 2023-07-18 NOTE — Progress Notes (Signed)
   07/01/2023 1459  Spiritual Encounters  Conversation partners present during encounter Nurse  Reason for visit End-of-life  OnCall Visit No   The patient's wife, Zachary Chambers, "would like to discontinue life sustaining measures and begin comfort care after she has time to call the grandchildren and allow them to have closure." Transition to comfort care to begin this afternoon. Present  at the patient's bedside is his wife, brother, brother's wife and son. Family and/or nurse will contact Chaplain closer to removal per family request.

## 2023-07-18 NOTE — Progress Notes (Signed)
Discussed patient's condition and status with family (wife, brother, and son) today, wife is POA. We discussed the treatment options of comfort care vs continuing life sustaining interventions for Zachary Chambers's worsening respiratory function, sepsis, and severe AKI with oliguria. The wife, Zachary Chambers, would like to discontinue life sustaining measures and begin comfort care after she has time to call the grandchildren and allow them to have closure. Zachary Chambers does not want to pursue additional life sustaining measures such as possible HD, central lines, etc. Family is agreeable with Zachary Chambers's decision and we will begin the transition to comfort care after the family is able to gather this afternoon.

## 2023-07-18 NOTE — Progress Notes (Signed)
OT Cancellation Note and Discharge  Patient Details Name: Zachary Chambers MRN: [id number] DOB: 04-03-1951   Cancelled Treatment:    Reason Eval/Treat Not Completed:  (pt transitioning to comfort care, signing off)  Evern Bio 06/20/2023, 3:23 PM

## 2023-07-18 NOTE — Progress Notes (Signed)
   06/27/2023 0205  Therapy Vitals  Patient Position (if appropriate) Lying  Respiratory Assessment  Assessment Type Assess only  Respiratory Pattern Regular;Labored  Chest Assessment Chest expansion symmetrical  Cough Congested;Productive;Weak  Sputum Amount Small  Sputum Color Tan;Brodric Schauer  Sputum Consistency Thick  Sputum Specimen Source Nasal tracheal  Bilateral Breath Sounds Diminished;Rhonchi  Oxygen Therapy/Pulse Ox  O2 Device Bi-PAP  O2 Therapy Oxygen  FiO2 (%) 45 %   Pt NTS x2 due to upper airway congestion and an ineffective cough. Pt tolerated fairly well. Pt placed back on bipap after NTS without complications.

## 2023-07-18 NOTE — Progress Notes (Signed)
Discussed patient's condition and status with family (wife, brother, and son) today, wife is POA. We discussed the treatment options of comfort care vs continuing life sustaining interventions for Zachary Chambers's worsening respiratory function, sepsis, and severe AKI with oliguria. The wife, Zachary Chambers, would like to discontinue life sustaining measures and begin comfort care after she has time to call the grandchildren and allow them to have closure. Zachary Chambers does not want to pursue additional life sustaining measures such as possible HD, central lines, etc. Family is agreeable with Zachary Chambers's decision and we will begin the transition to comfort care after the family is able to gather this afternoon.  Please contact Chaplain Zachary Chambers at [phone] closer to removal of BiPAP/ initiating comfort care per family request.

## 2023-07-18 NOTE — Death Summary Note (Signed)
PPCM Death Summary   Name: Zachary Chambers MRN: [id number] DOB: Jun 28, 1951 72 y.o.  Date of Admission: 07/10/2023  3:47 PM Date of Discharge: 07/16/2023 17:25 Attending Physician: Dr. Tonia Brooms   Discharge Diagnosis: Principal Problem:   CAP (community acquired pneumonia) Active Problems:   TOBACCO ABUSE   COPD (chronic obstructive pulmonary disease) (HCC)   CAD (coronary artery disease)   S/P coronary artery stent placement   Chronic respiratory failure with hypoxia and hypercapnia (HCC) - 2 L/min at rest, 5 L/min with activity, baseline POC2 50 mmHg   PAF (paroxysmal atrial fibrillation) (HCC)   History of CVA with residual deficit   Primary hypertension   Septic shock (HCC)   Pressure injury of skin   Cause of death: Cardiopulmonary Arrest  Time of death: 1725  Disposition and follow-up:   Mr.Zachary Chambers was discharged from [facility] in expired condition.    Hospital Course: Acute on Chronic hypoxic and hypercapnic respiratory failure secondary to CAP and COPD exacerbation.  Sepsis 2/2 CAP Tobacco Abuse  Patient was admitted to the hospital on 11/13 for acute on chronic respiratory failure secondary to community acquired pneumonia and COPD exacerbation.  During his hospitalization, his respiratory function worsened and he required BiPAP placement and was admitted to the ICU for further management of his respiratory failure.  During his ICU admission, his respiratory failure clinically worsened and his laboratory work revealed an increasing leukocytosis and he was started on vancomycin and cefepime for broad coverage and MRSA coverage given the clinical worsening as well as the positive MRSA nasal swab.  On 11/19, after a long discussion with the wife, she decided to change CODE STATUS to DNR and DNI. On 11/20, patient remained dependent on BiPAP and his leukocytosis increased to 66.7K, his renal function began to worsen with a serum creatinine of 3.42, and he remained  unresponsive.  After a long discussion with the patient's wife on his status and deterioration due to  sepsis secondary to community-acquired pneumonia, she decided to transition the patient to comfort care only.  CAD HFpEF Paroxsymal Atrial Fibrillation Patient presented with a history of paroxysmal atrial fibrillation he was continued on Eliquis, and Lopressor.  Patient was started on an amiodarone infusion for atrial fibrillation with RVR as well as Cardizem 60 mg 3 times daily.  An echocardiogram on 11/19 showed an LVEF of 60-65%. On 11/20 patient's blood pressures became hypotensive and the Cardizem 60 mg 3 times daily was discontinued.   Acute encephalopathy  General Hospitalization, patient developed acute encephalopathy that was likely due to hypercapnia as well as uremia.  Patient remained unresponsive on 11/20 we transitioned him to comfort measures.  AKI Oliguria  Uremia  Patient presented to the hospital with an AKI.  Throughout his hospitalization, his serum creatinine waxed and waned between 1.7 and 2.2- 2.42.  On 11/20, his serum creatinine increased to 3.46 which was multifactorial due to sepsis from his community-acquired pneumonia, poor oral intake, and possibly cardiorenal syndrome with his increased BNP.  Nephrology was originally consulted however the consult was removed whenever family decided to pursue comfort care measures.  History of CVA Patient was continued on atorvastatin until he was transitioned to comfort care.   SignedFaith Rogue, DO 07/08/2023, 11:16 AM

## 2023-07-18 NOTE — Plan of Care (Signed)
  Problem: Clinical Measurements: Goal: Will remain free from infection Outcome: Progressing   Problem: Clinical Measurements: Goal: Diagnostic test results will improve Outcome: Progressing   Problem: Clinical Measurements: Goal: Respiratory complications will improve Outcome: Progressing   Problem: Clinical Measurements: Goal: Cardiovascular complication will be avoided Outcome: Progressing   Problem: Pain Management: Goal: General experience of comfort will improve Outcome: Progressing   Problem: Safety: Goal: Ability to remain free from injury will improve Outcome: Progressing   Problem: Skin Integrity: Goal: Risk for impaired skin integrity will decrease Outcome: Progressing

## 2023-07-18 NOTE — Progress Notes (Signed)
TF paused this am, since pt is on BIPAP. Waiting for cortrack placement

## 2023-07-18 NOTE — Progress Notes (Signed)
PCCM: Resident Note   NAMESUHAYB SLEMMER, MRN:  [id number], DOB:  09-17-1950, LOS: 7 ADMISSION DATE:  07/11/2023, CONSULTATION DATE:  07/05/2023 REFERRING MD:  Leroy Sea, MD  CHIEF COMPLAINT:  Respiratory Failure     History of Present Illness:  Zachary Chambers is a 72 y.o. 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 Here for worsening dyspnea and hypoxemia and being treated for copd exacerbation with CAP - antibiotics and steroids. In the last 24 hours has had worsening respiratory status and been on bipap. PCCM called to evaluate for ICU admission.   Pertinent  Medical History  COPD on home oxygen Etoh use disorder Significant Hospital Events: Including procedures, antibiotic start and stop dates in addition to other pertinent events   11/19: Patient required BiPAP for tachycardia and tachypnea  11/19: Started cefepime and vancomycin, d/c Unasyn  11/19: Code status change to DNR 11/19: Started Tube feeds  Interim History / Subjective:  No over night events  Review of Systems:   Unable to perform   Past Medical History:  He,  has a past medical history of ACS (acute coronary syndrome) (HCC) (04/19/2021), Alcohol abuse, Aortic atherosclerosis (HCC), CAD in native artery, Chronic respiratory failure (HCC), COPD (chronic obstructive pulmonary disease) (HCC), DJD (degenerative joint disease), GERD (gastroesophageal reflux disease), Hyperlipidemia, On home O2, Paroxysmal atrial fibrillation (HCC), Pericardial effusion, Pneumonia due to COVID-19 virus, PUD (peptic ulcer disease), Rotator cuff disorder, and Stroke (cerebrum) (HCC).   Surgical History:   Past Surgical History:  Procedure Laterality Date   COLONOSCOPY     EYE SURGERY     right eye removed at age 26   heart stent     x3- 2006     Social History:   reports that he has quit smoking. His smoking use included cigarettes. He has a 40 pack-year smoking history. He has never used  smokeless tobacco. He reports current alcohol use of about 30.0 standard drinks of alcohol per week. He reports that he does not currently use drugs.   Family History:  His family history includes COPD in his father; Colon cancer in his paternal grandfather; Diabetes in his maternal grandfather and paternal grandfather; Ovarian cancer in his mother. There is no history of Esophageal cancer, Rectal cancer, or Stomach cancer.   Home Medications  Prior to Admission medications   Medication Sig Start Date End Date Taking? Authorizing Provider  acetaminophen (TYLENOL) 325 MG tablet Take 2 tablets (650 mg total) by mouth every 6 (six) hours as needed for mild pain (or Fever >/= 101). 07/31/22  Yes Arrien, York Ram, MD  albuterol (PROVENTIL) (2.5 MG/3ML) 0.083% nebulizer solution Take 3 mLs (2.5 mg total) by nebulization every 4 (four) hours as needed for wheezing or shortness of breath. 10/29/22  Yes Donita Brooks, MD  apixaban (ELIQUIS) 5 MG TABS tablet Take 1 tablet (5 mg total) by mouth 2 (two) times daily. 10/15/22  Yes Donita Brooks, MD  atorvastatin (LIPITOR) 40 MG tablet Take 1 tablet (40 mg total) by mouth at bedtime. 10/15/22  Yes Donita Brooks, MD  Budeson-Glycopyrrol-Formoterol (BREZTRI AEROSPHERE) 160-9-4.8 MCG/ACT AERO Inhale 2 puffs into the lungs 2 (two) times daily. 10/15/22  Yes Donita Brooks, MD  Cholecalciferol (D3 PO) Take 1 tablet by mouth daily.   Yes [provider]  clotrimazole-betamethasone (LOTRISONE) cream APPLY TO SKIN 2 TIMES DAILY 10/15/22  Yes Donita Brooks, MD  Cyanocobalamin (VITAMIN B-12  PO) Take 1 tablet by mouth daily.   Yes [provider]  fluticasone (FLONASE) 50 MCG/ACT nasal spray Place 2 sprays into both nostrils daily. 10/15/22  Yes Donita Brooks, MD  isosorbide mononitrate (IMDUR) 30 MG 24 hr tablet Take 0.5 tablets (15 mg total) by mouth daily. 10/15/22  Yes Donita Brooks, MD  magnesium oxide (MAG-OX) 400 (240 Mg)  MG tablet Take 1 tablet (400 mg total) by mouth daily. 10/15/22  Yes Donita Brooks, MD  metoprolol tartrate (LOPRESSOR) 50 MG tablet TAKE 1 TABLET BY MOUTH TWICE DAILY 04/13/23  Yes Donita Brooks, MD  Multiple Vitamin (MULTIVITAMIN WITH MINERALS) TABS tablet Take 1 tablet by mouth daily. 10/15/22  Yes Donita Brooks, MD  nitroGLYCERIN (NITROSTAT) 0.4 MG SL tablet DISSOLVE 1 TABLET UNDER THE TONGUE EVERY 5 MINUTES AS NEEDED FOR CHEST PAIN. 10/15/22  Yes Donita Brooks, MD  omeprazole (PRILOSEC) 40 MG capsule Take 1 capsule (40 mg total) by mouth daily. 10/15/22  Yes Donita Brooks, MD  predniSONE (DELTASONE) 10 MG tablet Take 1 tablet (10 mg total) by mouth every morning. 10/15/22  Yes Donita Brooks, MD  QUEtiapine (SEROQUEL) 25 MG tablet Take 1 tablet (25 mg total) by mouth at bedtime. 10/15/22  Yes Donita Brooks, MD  Blood Glucose Calibration (TRUE METRIX LEVEL 1) Low SOLN USE AS DIRECTED. To monitor fsbs 2 times per day dx e11.9 06/02/21   Donita Brooks, MD  Blood Glucose Monitoring Suppl (TRUE METRIX AIR GLUCOSE METER) DEVI USE AS DIRECTED. To monitor fsbs 2 times per day dx e11.9 06/02/21   Donita Brooks, MD  glucose blood (RELION TRUE METRIX TEST STRIPS) test strip USE AS DIRECTED. To monitor fsbs 2 times per day dx e11.9 06/02/21   Donita Brooks, MD     Allergies No Known Allergies   Objective   Blood pressure (!) 120/59, pulse (!) 108, temperature 99.2 F (37.3 C), temperature source Axillary, resp. rate (!) 25, height 5\' 8"  (1.727 m), weight 51.7 kg, SpO2 91%.    FiO2 (%):  [45 %] 45 %   Intake/Output Summary (Last 24 hours) at 06/28/2023 1309 Last data filed at 06/20/2023 0700 Gross per 24 hour  Intake 999.99 ml  Output 150 ml  Net 849.99 ml   Filed Weights   07/06/2023 1612 07/05/23 1200 06/23/2023 0321  Weight: 59 kg 51.7 kg 51.7 kg    Examination: General: Laying in bed, NAD HENT: BiPAP mask in place Lungs: CTAB Cardiovascular: tachycardia,  regular rhythm  Abdomen: soft, bowel sounds present  Extremities: No pitting edema present  Neuro: Not alert or oriented  Labs   CBC:    Latest Ref Rng & Units 06/21/2023    6:36 AM 07/06/2023    3:36 AM 07/05/2023    3:41 AM  CBC  WBC 4.0 - 10.5 K/uL 66.7  55.2  51.3  C  Hemoglobin 13.0 - 17.0 g/dL 77.8  24.2  35.3   Hematocrit 39.0 - 52.0 % 34.9  36.0  40.5   Platelets 150 - 400 K/uL 703  746  639     C Corrected result     Basic Metabolic Panel:    Latest Ref Rng & Units 06/27/2023    6:36 AM 07/06/2023    3:36 AM 07/05/2023    3:41 AM  BMP  Glucose 70 - 99 mg/dL 614  431  58   BUN 8 - 23 mg/dL 92  63  48  Creatinine 0.61 - 1.24 mg/dL 3.24  4.01  0.27   Sodium 135 - 145 mmol/L 142  142  142   Potassium 3.5 - 5.1 mmol/L 4.1  4.2  4.7   Chloride 98 - 111 mmol/L 104  105  103   CO2 22 - 32 mmol/L 22  23  25    Calcium 8.9 - 10.3 mg/dL 9.3  9.3  9.8     GFR: Estimated Creatinine Clearance: 13.7 mL/min (A) (by C-G formula based on SCr of 3.46 mg/dL (H)). Recent Labs  Lab 06/23/2023 1830 06/23/2023 2033 07/01/23 0521 07/04/23 0657 07/05/23 0341 07/05/23 0706 07/06/23 0336 07/06/2023 0636  PROCALCITON  --   --   --   --   --  8.26 24.67  --   WBC  --   --    < > 54.4* 51.3*  --  55.2* 66.7*  LATICACIDVEN 1.9 2.2*  --   --   --   --   --   --    < > = values in this interval not displayed.    Liver Function Tests: Recent Labs  Lab 07/04/23 0529 07/05/23 0341 07/06/23 0336  AST 51* 48* 41  ALT 22 22 21   ALKPHOS 175* 156* 142*  BILITOT 0.4 1.0 0.6  PROT 7.2 6.7 6.7  ALBUMIN 2.9* 2.6* 2.4*   No results for input(s): "LIPASE", "AMYLASE" in the last 168 hours. No results for input(s): "AMMONIA" in the last 168 hours.  ABG    Component Value Date/Time   PHART 7.3 (L) 07/04/2023 1050   PCO2ART 61 (H) 07/04/2023 1050   PO2ART 76 (L) 07/04/2023 1050   HCO3 30.0 (H) 07/04/2023 1050   TCO2 36 (H) 07/27/2022 0659   O2SAT 96.4 07/04/2023 1050      HbA1C: Hgb A1c MFr Bld  Date/Time Value Ref Range Status  07/05/2023 12:39 PM 5.7 (H) 4.8 - 5.6 % Final    Comment:    (NOTE) Pre diabetes:          5.7%-6.4%  Diabetes:              >6.4%  Glycemic control for   <7.0% adults with diabetes   03/06/2020 02:24 AM 5.4 4.8 - 5.6 % Final    Comment:    (NOTE) Pre diabetes:          5.7%-6.4%  Diabetes:              >6.4%  Glycemic control for   <7.0% adults with diabetes     CBG: Recent Labs  Lab 07/05/2023 0344 07/01/2023 0756 06/29/2023 1113  GLUCAP 148* 169* 147*   Echocardiogram:  -LVEF 60-65% -The inferior vena cava is dilated in size with <50% respiratory variability, suggesting right atrial pressure of 15 mmHg.  Resolved Hospital Problem list    Assessment & Plan:  Acute on Chronic hypoxic and hypercapnic respiratory failure secondary to CAP and COPD exacerbation.  Sepsis 2/2 CAP Patient remains on BiPAP for respiratory failure. His leukocytosis has up trended to 66.7K  Plan: -Continue cefepime and Vancomycin renal dosing per pharmacy for MRSA coverage  -Continue Solumedrol 60 mg -Continue Duonebs q2 hours prn  -Continue BiPAP  -Will discuss GOC once POA is present    Atrial Fibrillation -Will continue Eliquis 2.5 mg BID for AC -Continue Amiodarone infusion  -lopressor to 5 mg q6 hours IV  -D/c Cardizem 60mg  q8hours due to low BP    Acute encephalopathy  -Likely due to Hypercapnia as  well as worsening uremia  -Continue BiPAP    AKI Oliguria  Uremia  Patient's renal function has worsened from a serum creatinine of 2.42 to 3.42 and his urine output has decreased to 150cc over the past 24 hours. His worsening renal function is most likely multifactorial due to poor oral intake, sepsis, cardiorenal syndrome with an increasing BNP of 638.2. Plan:  -Nephrology is consulted, will reach out to after a GOC    Best Practice (right click and "Reselect all SmartList Selections" daily)   Diet/type: Tube feeds   DVT prophylaxis: DOAC Pressure ulcer(s). Clinically undetermined if the pressure injury was present on admission GI prophylaxis: PPI Lines: N/A Code Status:  DNR

## 2023-07-18 NOTE — Progress Notes (Signed)
Nutrition Brief Note  Chart reviewed. Pt now transitioning to comfort care.  No further nutrition interventions planned at this time.  Please re-consult as needed.   Cate Harrold Fitchett MS, RDN, LDN, CNSC Registered Dietitian 3 Clinical Nutrition RD Pager and On-Call Pager Number Located in Amion    

## 2023-07-18 NOTE — Progress Notes (Signed)
PT Cancellation Note  Patient Details Name: Zachary Chambers MRN: [id number] DOB: [birth year]   Cancelled Treatment:    Reason Eval/Treat Not Completed: Medical issues which prohibited therapy, pt transitioned to comfort care per chart review. Pt unable to participate in therapy and unresponsive to commands per RN. Will check back if/when appropriate.   Lenora Boys. PTA Acute Rehabilitation Services Office: [phone]    Catalina Antigua 07/11/2023, 1:15 PM

## 2023-07-18 NOTE — Progress Notes (Signed)
Visited with patient and family, provided spiritual care and prayer. Patient being moved to comfort care. Will alert on coming chaplain

## 2023-07-18 DEATH — deceased

## 2023-07-30 ENCOUNTER — Ambulatory Visit: Payer: Self-pay | Admitting: *Deleted
